# Patient Record
Sex: Female | Born: 1940 | Race: White | Hispanic: No | Smoking: Former smoker
Health system: Southern US, Community
[De-identification: ages and names within clinical notes are randomized; demographics above are authoritative.]

## PROBLEM LIST (undated history)

## (undated) DIAGNOSIS — F32A Depression, unspecified: Secondary | ICD-10-CM

## (undated) DIAGNOSIS — M1990 Unspecified osteoarthritis, unspecified site: Secondary | ICD-10-CM

## (undated) DIAGNOSIS — R29898 Other symptoms and signs involving the musculoskeletal system: Secondary | ICD-10-CM

## (undated) DIAGNOSIS — R112 Nausea with vomiting, unspecified: Secondary | ICD-10-CM

## (undated) DIAGNOSIS — M79604 Pain in right leg: Secondary | ICD-10-CM

## (undated) DIAGNOSIS — Z974 Presence of external hearing-aid: Secondary | ICD-10-CM

## (undated) DIAGNOSIS — D497 Neoplasm of unspecified behavior of endocrine glands and other parts of nervous system: Secondary | ICD-10-CM

## (undated) DIAGNOSIS — M75100 Unspecified rotator cuff tear or rupture of unspecified shoulder, not specified as traumatic: Secondary | ICD-10-CM

## (undated) DIAGNOSIS — M8580 Other specified disorders of bone density and structure, unspecified site: Secondary | ICD-10-CM

## (undated) DIAGNOSIS — Z9889 Other specified postprocedural states: Secondary | ICD-10-CM

## (undated) DIAGNOSIS — G4730 Sleep apnea, unspecified: Secondary | ICD-10-CM

## (undated) DIAGNOSIS — R569 Unspecified convulsions: Secondary | ICD-10-CM

## (undated) DIAGNOSIS — N390 Urinary tract infection, site not specified: Secondary | ICD-10-CM

## (undated) DIAGNOSIS — C801 Malignant (primary) neoplasm, unspecified: Secondary | ICD-10-CM

## (undated) DIAGNOSIS — F329 Major depressive disorder, single episode, unspecified: Secondary | ICD-10-CM

## (undated) DIAGNOSIS — M549 Dorsalgia, unspecified: Secondary | ICD-10-CM

## (undated) DIAGNOSIS — D649 Anemia, unspecified: Secondary | ICD-10-CM

## (undated) DIAGNOSIS — G709 Myoneural disorder, unspecified: Secondary | ICD-10-CM

## (undated) HISTORY — DX: Other symptoms and signs involving the musculoskeletal system: R29.898

## (undated) HISTORY — PX: TONSILLECTOMY: SUR1361

## (undated) HISTORY — PX: COLONOSCOPY: SHX174

## (undated) HISTORY — DX: Other specified disorders of bone density and structure, unspecified site: M85.80

## (undated) HISTORY — DX: Malignant (primary) neoplasm, unspecified: C80.1

## (undated) HISTORY — DX: Unspecified rotator cuff tear or rupture of unspecified shoulder, not specified as traumatic: M75.100

## (undated) HISTORY — DX: Unspecified osteoarthritis, unspecified site: M19.90

## (undated) HISTORY — DX: Urinary tract infection, site not specified: N39.0

## (undated) HISTORY — DX: Presence of external hearing-aid: Z97.4

## (undated) HISTORY — PX: ROTATOR CUFF REPAIR: SHX139

## (undated) HISTORY — DX: Neoplasm of unspecified behavior of endocrine glands and other parts of nervous system: D49.7

## (undated) HISTORY — DX: Dorsalgia, unspecified: M54.9

## (undated) HISTORY — DX: Pain in right leg: M79.604

---
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
## 1971-09-03 HISTORY — PX: APPENDECTOMY: SHX54

## 1971-09-03 HISTORY — PX: ABDOMINAL HYSTERECTOMY: SHX81

## 1985-09-02 HISTORY — PX: OTHER SURGICAL HISTORY: SHX169

## 2000-09-06 ENCOUNTER — Emergency Department (HOSPITAL_COMMUNITY): Admission: EM | Admit: 2000-09-06 | Discharge: 2000-09-06 | Payer: Self-pay | Admitting: Emergency Medicine

## 2000-10-03 ENCOUNTER — Other Ambulatory Visit: Admission: RE | Admit: 2000-10-03 | Discharge: 2000-10-03 | Payer: Self-pay | Admitting: Obstetrics and Gynecology

## 2001-11-20 ENCOUNTER — Other Ambulatory Visit: Admission: RE | Admit: 2001-11-20 | Discharge: 2001-11-20 | Payer: Self-pay | Admitting: Obstetrics and Gynecology

## 2002-05-31 ENCOUNTER — Encounter (INDEPENDENT_AMBULATORY_CARE_PROVIDER_SITE_OTHER): Payer: Self-pay | Admitting: Specialist

## 2002-05-31 ENCOUNTER — Ambulatory Visit (HOSPITAL_COMMUNITY): Admission: RE | Admit: 2002-05-31 | Discharge: 2002-05-31 | Payer: Self-pay | Admitting: Gastroenterology

## 2002-09-02 LAB — HM COLONOSCOPY

## 2004-09-02 HISTORY — PX: OTHER SURGICAL HISTORY: SHX169

## 2005-04-30 ENCOUNTER — Encounter: Admission: RE | Admit: 2005-04-30 | Discharge: 2005-04-30 | Payer: Self-pay | Admitting: Orthopedic Surgery

## 2005-09-02 LAB — HM MAMMOGRAPHY

## 2005-12-03 ENCOUNTER — Other Ambulatory Visit: Admission: RE | Admit: 2005-12-03 | Discharge: 2005-12-03 | Payer: Self-pay | Admitting: Obstetrics and Gynecology

## 2005-12-10 ENCOUNTER — Encounter: Admission: RE | Admit: 2005-12-10 | Discharge: 2005-12-10 | Payer: Self-pay | Admitting: Obstetrics and Gynecology

## 2005-12-23 ENCOUNTER — Encounter: Admission: RE | Admit: 2005-12-23 | Discharge: 2005-12-23 | Payer: Self-pay | Admitting: Obstetrics and Gynecology

## 2007-04-07 ENCOUNTER — Ambulatory Visit: Payer: Self-pay | Admitting: Internal Medicine

## 2007-04-07 DIAGNOSIS — K3189 Other diseases of stomach and duodenum: Secondary | ICD-10-CM | POA: Insufficient documentation

## 2007-04-07 DIAGNOSIS — H5710 Ocular pain, unspecified eye: Secondary | ICD-10-CM | POA: Insufficient documentation

## 2007-04-07 DIAGNOSIS — R1013 Epigastric pain: Secondary | ICD-10-CM

## 2007-04-07 DIAGNOSIS — H538 Other visual disturbances: Secondary | ICD-10-CM | POA: Insufficient documentation

## 2007-04-08 ENCOUNTER — Encounter: Payer: Self-pay | Admitting: Internal Medicine

## 2007-04-09 ENCOUNTER — Ambulatory Visit: Payer: Self-pay | Admitting: Internal Medicine

## 2007-04-09 LAB — CONVERTED CEMR LAB
Bilirubin Urine: NEGATIVE
Glucose, Urine, Semiquant: NEGATIVE
Ketones, urine, test strip: NEGATIVE
Nitrite: NEGATIVE
Protein, U semiquant: NEGATIVE
Specific Gravity, Urine: 1.015
Urobilinogen, UA: 0.2
WBC Urine, dipstick: NEGATIVE
pH: 7

## 2007-04-14 LAB — CONVERTED CEMR LAB
ALT: 14 units/L (ref 0–35)
AST: 21 units/L (ref 0–37)
Albumin: 4.3 g/dL (ref 3.5–5.2)
Alkaline Phosphatase: 57 units/L (ref 39–117)
BUN: 14 mg/dL (ref 6–23)
Basophils Absolute: 0 10*3/uL (ref 0.0–0.1)
Basophils Relative: 0.7 % (ref 0.0–1.0)
Bilirubin, Direct: 0.1 mg/dL (ref 0.0–0.3)
CO2: 28 meq/L (ref 19–32)
Calcium: 9.8 mg/dL (ref 8.4–10.5)
Chloride: 104 meq/L (ref 96–112)
Cholesterol: 207 mg/dL (ref 0–200)
Creatinine, Ser: 0.8 mg/dL (ref 0.4–1.2)
Direct LDL: 99.1 mg/dL
Eosinophils Absolute: 0.1 10*3/uL (ref 0.0–0.6)
Eosinophils Relative: 1.2 % (ref 0.0–5.0)
GFR calc Af Amer: 93 mL/min
GFR calc non Af Amer: 77 mL/min
Glucose, Bld: 90 mg/dL (ref 70–99)
HCT: 34.5 % — ABNORMAL LOW (ref 36.0–46.0)
HDL: 85.2 mg/dL (ref >39.0)
Hemoglobin: 11.9 g/dL — ABNORMAL LOW (ref 12.0–15.0)
LDL Cholesterol: 110 mg/dL — ABNORMAL HIGH (ref 0–99)
Lymphocytes Relative: 38.5 % (ref 12.0–46.0)
MCHC: 34.5 g/dL (ref 30.0–36.0)
MCV: 98.2 fL (ref 78.0–100.0)
Monocytes Absolute: 0.4 10*3/uL (ref 0.2–0.7)
Monocytes Relative: 7.5 % (ref 3.0–11.0)
Neutro Abs: 3.1 10*3/uL (ref 1.4–7.7)
Neutrophils Relative %: 52.1 % (ref 43.0–77.0)
Platelets: 223 10*3/uL (ref 150–400)
Potassium: 5.7 meq/L — ABNORMAL HIGH (ref 3.5–5.1)
RBC: 3.52 M/uL — ABNORMAL LOW (ref 3.87–5.11)
RDW: 12.8 % (ref 11.5–14.6)
Sodium: 141 meq/L (ref 135–145)
TSH: 1.17 microintl units/mL (ref 0.35–5.50)
Total Bilirubin: 1 mg/dL (ref 0.3–1.2)
Total CHOL/HDL Ratio: 2.4
Total Protein: 6.7 g/dL (ref 6.0–8.3)
Triglycerides: 57 mg/dL (ref 0–149)
VLDL: 11 mg/dL (ref 0–40)
WBC: 5.9 10*3/uL (ref 4.5–10.5)

## 2007-04-22 ENCOUNTER — Ambulatory Visit: Payer: Self-pay | Admitting: Internal Medicine

## 2007-04-22 LAB — CONVERTED CEMR LAB
Tissue Transglutaminase Ab, IgA: 0.5 units (ref <7)
Vit D, 1,25-Dihydroxy: 39 (ref 20–57)

## 2007-05-12 ENCOUNTER — Ambulatory Visit: Payer: Self-pay | Admitting: Internal Medicine

## 2007-05-12 DIAGNOSIS — D649 Anemia, unspecified: Secondary | ICD-10-CM | POA: Insufficient documentation

## 2007-05-12 DIAGNOSIS — E875 Hyperkalemia: Secondary | ICD-10-CM | POA: Insufficient documentation

## 2007-06-09 ENCOUNTER — Ambulatory Visit: Payer: Self-pay | Admitting: Internal Medicine

## 2007-06-12 LAB — CONVERTED CEMR LAB
BUN: 17 mg/dL (ref 6–23)
Basophils Absolute: 0 10*3/uL (ref 0.0–0.1)
Basophils Relative: 0.7 % (ref 0.0–1.0)
CO2: 31 meq/L (ref 19–32)
Calcium: 9.9 mg/dL (ref 8.4–10.5)
Chloride: 102 meq/L (ref 96–112)
Creatinine, Ser: 0.8 mg/dL (ref 0.4–1.2)
Eosinophils Absolute: 0.1 10*3/uL (ref 0.0–0.6)
Eosinophils Relative: 1.3 % (ref 0.0–5.0)
Ferritin: 46.8 ng/mL (ref 10.0–291.0)
Folate: >20 ng/mL
GFR calc Af Amer: 93 mL/min
GFR calc non Af Amer: 77 mL/min
Glucose, Bld: 89 mg/dL (ref 70–99)
HCT: 32.8 % — ABNORMAL LOW (ref 36.0–46.0)
Hemoglobin: 11.5 g/dL — ABNORMAL LOW (ref 12.0–15.0)
Iron: 85 ug/dL (ref 42–145)
Lymphocytes Relative: 37.3 % (ref 12.0–46.0)
MCHC: 35 g/dL (ref 30.0–36.0)
MCV: 96.8 fL (ref 78.0–100.0)
Monocytes Absolute: 0.5 10*3/uL (ref 0.2–0.7)
Monocytes Relative: 7.4 % (ref 3.0–11.0)
Neutro Abs: 3.5 10*3/uL (ref 1.4–7.7)
Neutrophils Relative %: 53.3 % (ref 43.0–77.0)
Platelets: 242 10*3/uL (ref 150–400)
Potassium: 5 meq/L (ref 3.5–5.1)
RBC: 3.38 M/uL — ABNORMAL LOW (ref 3.87–5.11)
RDW: 12.7 % (ref 11.5–14.6)
Sodium: 140 meq/L (ref 135–145)
Vitamin B-12: 438 pg/mL (ref 211–911)
WBC: 6.5 10*3/uL (ref 4.5–10.5)

## 2007-06-30 ENCOUNTER — Ambulatory Visit: Payer: Self-pay | Admitting: Internal Medicine

## 2008-01-19 ENCOUNTER — Ambulatory Visit: Payer: Self-pay | Admitting: Internal Medicine

## 2008-01-29 LAB — CONVERTED CEMR LAB
Basophils Absolute: 0 10*3/uL (ref 0.0–0.1)
Basophils Relative: 0.5 % (ref 0.0–1.0)
Eosinophils Absolute: 0.1 10*3/uL (ref 0.0–0.7)
Eosinophils Relative: 1.3 % (ref 0.0–5.0)
HCT: 34.6 % — ABNORMAL LOW (ref 36.0–46.0)
Hemoglobin: 11.5 g/dL — ABNORMAL LOW (ref 12.0–15.0)
Lymphocytes Relative: 42 % (ref 12.0–46.0)
MCHC: 33.3 g/dL (ref 30.0–36.0)
MCV: 99 fL (ref 78.0–100.0)
Monocytes Absolute: 0.3 10*3/uL (ref 0.1–1.0)
Monocytes Relative: 6 % (ref 3.0–12.0)
Neutro Abs: 2.7 10*3/uL (ref 1.4–7.7)
Neutrophils Relative %: 50.2 % (ref 43.0–77.0)
Platelets: 214 10*3/uL (ref 150–400)
RBC: 3.49 M/uL — ABNORMAL LOW (ref 3.87–5.11)
RDW: 12.9 % (ref 11.5–14.6)
WBC: 5.4 10*3/uL (ref 4.5–10.5)

## 2008-05-13 ENCOUNTER — Ambulatory Visit: Payer: Self-pay | Admitting: Internal Medicine

## 2008-05-13 DIAGNOSIS — D485 Neoplasm of uncertain behavior of skin: Secondary | ICD-10-CM | POA: Insufficient documentation

## 2009-07-21 ENCOUNTER — Ambulatory Visit: Payer: Self-pay | Admitting: Internal Medicine

## 2009-07-21 DIAGNOSIS — R0989 Other specified symptoms and signs involving the circulatory and respiratory systems: Secondary | ICD-10-CM | POA: Insufficient documentation

## 2009-08-03 ENCOUNTER — Encounter: Admission: RE | Admit: 2009-08-03 | Discharge: 2009-08-03 | Payer: Self-pay | Admitting: Internal Medicine

## 2009-08-15 ENCOUNTER — Encounter: Payer: Self-pay | Admitting: Internal Medicine

## 2009-08-16 ENCOUNTER — Encounter: Payer: Self-pay | Admitting: Internal Medicine

## 2009-08-16 ENCOUNTER — Ambulatory Visit: Payer: Self-pay

## 2010-10-04 NOTE — Assessment & Plan Note (Signed)
Summary: pain in rib area/njr   Vital Signs:  Patient profile:   70 year old female Menstrual status:  hysterectomy Height:      61.25 inches Weight:      106 pounds BMI:     19.94 O2 Sat:      95 % on Room air Temp:     97.3 degrees F oral Pulse rate:   74 / minute BP sitting:   100 / 70  (left arm) Cuff size:   regular  Vitals Entered By: Romualdo Bolk, CMA (AAMA) (July 21, 2009 1:55 PM)  O2 Flow:  Room air CC: Pain on left side of chest under rib cage. Pt states that it feels like someone is on the inside squeezing and pinching and gets worse during the day. Laying down does calm it down.     Menstrual Status hysterectomy   History of Present Illness: Kaitlyn Flowers comes in today for    sda for above.    Insidious onset of LUQ  LEft lower rib area pain without trauma  or other inciting factor obvious.  Seems to be worse at night and evening  and better in am with rest.  Worse sometimes with a dep breath and no radiation .  Feels like being pinched  really hard and onset 1 week ago.    No injury    never had before . is right handed. Some coughing   nagging like a cold  some chills but no progressive resp .sx  . No NVD .   No rashes .  No uti signs . Generally feels well otherwise.  Preventive Screening-Counseling & Management  Alcohol-Tobacco     Alcohol drinks/day: <1     Alcohol type: wine     Smoking Status: never     Year Quit: tried  Caffeine-Diet-Exercise     Caffeine use/day: 1     Does Patient Exercise: yes     Type of exercise: Walking and Weights     Times/week: 6  Current Medications (verified): 1)  None  Allergies (verified): 1)  ! 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  Half of ovary left.7    Family History: Last updated:  05/13/2008 Family History of Stroke F 1st degree relative   mother in her ease Family History of Arthritis Family History Uterine cancermaternal aunt father and brother are alive and well.    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    Physical Exam  General:  Well-developed,well-nourished,in no acute distress; alert,appropriate and cooperative throughout examination Head:  normocephalic and atraumatic.   Eyes:  vision grossly intact and pupils equal.   Neck:  No deformities, masses, or tenderness noted. Chest Wall:  no deformities and no mass.  no bruising or discrete rib painlaterally Lungs:  Normal respiratory effort, chest expands symmetrically. Lungs are clear  to auscultation, no crackles or wheezes.no dullness.   Heart:  Normal rate and regular rhythm. S1 and S2 normal without gallop, murmur, click, rub or other extra sounds.no lifts.   Abdomen:  soft, non-tender, normal bowel sounds, no distention, no masses, no guarding, no rigidity, no hepatomegaly, and no splenomegaly.  points to area under rib cage  MCL  , left upper quadrant.    mid short systolic bruit heard upper abdomen  no masses . diastole is clear. Pulses:  pulses intact without delay   Extremities:  no clubbing cyanosis or edema  Neurologic:  non focal  Skin:  turgor normal and color  normal.   Cervical Nodes:  No lymphadenopathy noted Inguinal Nodes:  No significant adenopathy Psych:  Oriented X3, good eye contact, not anxious appearing, and not depressed appearing.     Impression & Recommendations:  Problem # 1:  ? of CHEST WALL PAIN, ANTERIOR (ICD-786.52)  vs luq area .  quality of pain seems like cwp but because new and cannot really localize consider further eval if persistent and progressive  .  No other alarm features today.  Orders: Radiology Referral (Radiology)  Problem # 2:  ABDOMINAL BRUIT (ICD-785.9)  scaphoid abdomen  and only systolic  and good pulses but  need to  do [place] abd .   Orders: Doppler Referral (Doppler) Radiology Referral (Radiology)  Patient Instructions: 1)  your pain acts like a chest wall pain  2)  however because of the artery sound on your abdominal exam would rec an ultrasound . Our office will call you about this. 3)  other wise treat sympromatically.   4)  if  persistent and progressive  then follow up . consider c xray.

## 2010-10-04 NOTE — Miscellaneous (Signed)
Summary: Orders Update  Clinical Lists Changes  Orders: Added new Test order of Abdominal Aorta Duplex (Abd Aorta Duplex) - Signed 

## 2010-10-04 NOTE — Assessment & Plan Note (Signed)
Summary: lab roa-smm rsc per pt/njr   Vital Signs:  Patient Profile:   70 Years Old Female Height:     61.25 inches Weight:      110 pounds Pulse rate:   78 / minute BP sitting:   100 / 60  (right arm) Cuff size:   regular  Vitals Entered By: Romualdo Bolk, CMA (June 30, 2007 2:18 PM)                 Chief Complaint:  Follow up on labs.  History of Present Illness: Kaitlyn Flowers comes in today for follow up of lab evaluation.  She has no new symptom . See last ov . She does a gluten free diet because of presumed celiac but no  definitive dx and feels ok.   No hx of current problems.    Current Allergies (reviewed today): ! 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   Skin:     no ecchymoses and no petechiae.   Additional Exam:     see labs    Impression & Recommendations:  Problem # 1:  ANEMIA NOS (ICD-285.9) borderline and neg eval   this may be normal for her.  will follow since she is doing quite well  lot [id number], EXP 30 jun 09, sanofi pasteur left deltoid IM, 0.5 cc. 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 optimize vit d  intake.  Other Orders: Influenza Vaccine MCR (16109)   Patient Instructions: 1)   cbc and diff   in 6 months and as needed   285.9    ]  Influenza Vaccine    Vaccine Type: Fluvax MCR    Given by: Romualdo Bolk, CMA  Flu Vaccine Consent Questions    Do you have a history of severe allergic reactions to this vaccine? no    Any prior history of allergic reactions to egg and/or gelatin? no    Do you have a sensitivity to the preservative Thimersol? no    Do you have a past history of Guillan-Barre Syndrome? no    Do you currently have an acute febrile illness? no    Have you ever had a severe reaction to latex? no    Vaccine information given and explained to patient? yes    Are you currently pregnant? no

## 2010-10-04 NOTE — Assessment & Plan Note (Signed)
Summary: to be est/njr   Vital Signs:  Patient Profile:   70 Years Old Female Height:     61.25 inches Weight:      105 pounds Pulse rate:   78 / minute BP sitting:   120 / 80  (left arm) Cuff size:   regular  Vitals Entered By: Romualdo Bolk, CMA (April 07, 2007 11:18 AM)               Chief Complaint:  New Patient to get establish and eyes are irriatated and hurt started 04/06/07.  History of Present Illness:   This is the initial visit for Kaitlyn Flowers who comes in today to establish.  She has no primary care physician and her GYN is Dr. Pennie Rushing.  She was in her usual state of health until last night and then this morning when after participating in water boats at [employer] she had some eye irritation and improved last night.  This morning however she has had significantly painful eye bilaterally with redness photophobia and blurry vision.  She is unable to read.  She does have glasses and no contacts however this has never happened to her before.  There is no fever or discharge and no history of trauma.  She did try some over-the-counter eyewash but this made it worse.  She prefers to have the light off during the exam.  In regard to her other past medical history she is doing fairly well has not had blood work done in quite a while.  She is up-to-date on her healthcare maintenance see database.  She does have some questions about congested issues.  At one point in time she was told she had gluten sensitivity but then had a blood test and was told that she doesn't have it.  She has had a colonoscopy but I do not think she has had a small bowel biopsy.  There is no family history of celiac disease.  She does have a history of some osteopenia that is apparently stable.  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  Initially thought to be malignant but then reviewed as benign and was treated with radiation.  Her  MRIs have been stable up to 10 years ago and when she was released from care she has no residual symptoms.  Current Allergies (reviewed today): ! PENICILLIN  Past Medical History:    Osteopenia    spinal cord tumor  Past Surgical History:    Spinal Cord [phone]    Shoulder Rotator Cuff2006    Hysterectomyfor a variant tumors.  Half of ovary left.48   Family History:    Family History of Stroke F 1st degree relative   mother in her ease    Family History of Arthritis    Family History Uterine cancermaternal aunt    father and brother are alive and well.  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 who prefers a darkened rim.  She has obvious redness of hers Clara.   Head:     Normocephalic and atraumatic without obvious abnormalities. No apparent alopecia or balding. Eyes:     pupils minimally reactive to light R. regular shape plus two conjunctiva with erythema no obvious lesions or discharge I don't see a ciliary flush. Ears:     External ear exam shows no significant lesions or deformities.  Otoscopic examination reveals clear canals, tympanic membranes are intact bilaterally without bulging, retraction, inflammation or discharge. Hearing is grossly normal bilaterally. Mouth:     Oral mucosa and oropharynx without lesions or  exudates.  Teeth in good repair. Neck:     No deformities, masses, or tenderness noted. Lungs:     Normal respiratory effort, chest expands symmetrically. Lungs are clear to auscultation, no crackles or wheezes. Heart:     Normal rate and regular rhythm. S1 and S2 normal without gallop, murmur, click, rub or other extra sounds. Abdomen:     Bowel sounds positive,abdomen soft and non-tender without masses, organomegaly or hernias noted.there is a systolic bruit over her mid abdomen with no diastolic component and no masses Extremities:     no atrophy Neurologic:     nonfocal Skin:     no unusual rashes Psych:     normally interactive.      Impression & Recommendations:  Problem # 1:  EYE PAIN (ICD-379.91) with photophobia redness and change in vision I recommend she see ophthalmologist today.  She will see Dr. Earlene Plater this afternoon.  I am concerned about arthritis or some chemical irritant on her eyes bilaterally.  Problem # 2:  OSTEOPENIA (ICD-733.90) we will schedule a vitamin D level as well as CPX lab.  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  We can repeat a celiac panel and review any records that we are able to obtain in the future.  I will discuss more at follow-up.  Problem # 4:  Preventive Health Care (ICD-V70.0) patient appears to be up-to-date and preventive measures.  We are unsure of her last tetanus shot but will address that at her follow-up.   Patient Instructions: 1)  she will see ophthalmologist today. 2)   Will plan fasting lab tests and rov 3)  BMP,LFT,CBC diff,Lipid,TSH, U/A.  V70.0  4)  vitamin d 733.9 5)  celiac panel 773.9           Appended Document: to be est/njr LMTOCB  Appended Document: to be est/njr Pt aware and lab appointment made for 04/09/07

## 2010-11-23 ENCOUNTER — Ambulatory Visit (INDEPENDENT_AMBULATORY_CARE_PROVIDER_SITE_OTHER): Payer: Medicare Other | Admitting: Internal Medicine

## 2010-11-23 ENCOUNTER — Encounter: Payer: Self-pay | Admitting: Internal Medicine

## 2010-11-23 VITALS — BP 100/60 | HR 72 | Temp 98.4°F | Wt 105.0 lb

## 2010-11-23 DIAGNOSIS — D497 Neoplasm of unspecified behavior of endocrine glands and other parts of nervous system: Secondary | ICD-10-CM

## 2010-11-23 DIAGNOSIS — M549 Dorsalgia, unspecified: Secondary | ICD-10-CM

## 2010-11-23 DIAGNOSIS — M545 Low back pain, unspecified: Secondary | ICD-10-CM

## 2010-11-23 NOTE — Patient Instructions (Signed)
Someone will contact you  About getting the mri.   FU as appropriate .

## 2010-11-23 NOTE — Progress Notes (Signed)
  Subjective:    Patient ID: Kaitlyn Flowers, female    DOB: May 05, 1941, 70 y.o.   MRN: [id number]  HPI  patient comes in today for a visit to discuss the problem with her lower back. She states that she has had a mild problem off and on for a while and sees the chiropractor Dr. Melvyn Neth as needed. However her back problem has been worse over the last month or so and she is worried it is something more important. Low back   Is Achy  Tingling in  Legs off an on.   No nocturnal worse after sitting  bending.  ? If legs felt weaker than usual no falling.  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 Initially felt to be partly malignant but on second opinion benign.   Tumor follow up mri for 4 years  Showed no change so she has had no followup   imaging since then   Past Medical History  Diagnosis Date  . Osteopenia   . 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   Past Surgical History  Procedure Date  . Abdominal hysterectomy 1973    1/2 of ovary left  . Shoulder rotator culff  2006  . Spinal cord tumor 1987    reports that she has never smoked. She does not have any smokeless tobacco history on file. She reports that she drinks alcohol. She reports that she does not use illicit drugs. family history includes Stroke in her mother. Allergies  Allergen Reactions  . Penicillins     .Review of Systems No fever     Bleeding   Chest pain shortness of breath inability to exercise bowel or bladder dysfunctional changes. Rest of ROS noncontributory    Objective:   Physical Exam  well-developed well-nourished in no acute distress gait within normal limits. Neck supple without masses or bruits  Chest:  Clear to A&P without wheezes rales or rhonchi CV:  S1-S2 no gallops or murmurs peripheral perfusion is normal MS well healed scare low thoracic area  Good rom toe  heel walk is good   ROM back is nl.  Neuro hard to elicit dtrs le no clonus noted      Assessment & Plan:   lumbar back pain:  change in baseline this past history of thoracic spinal tumor.  She has some very mild leg symptoms associated with this that come and go. Otherwise no obvious associated symptoms of concern.   I advised MRI to look at this area although this may end up just being degenerative disease.  we'll send order for this call in the meantime and will followup and checkup in about a month.

## 2010-11-26 ENCOUNTER — Telehealth: Payer: Self-pay | Admitting: *Deleted

## 2010-11-26 DIAGNOSIS — M549 Dorsalgia, unspecified: Secondary | ICD-10-CM

## 2010-11-26 DIAGNOSIS — M899 Disorder of bone, unspecified: Secondary | ICD-10-CM

## 2010-11-26 DIAGNOSIS — E785 Hyperlipidemia, unspecified: Secondary | ICD-10-CM

## 2010-11-26 DIAGNOSIS — D649 Anemia, unspecified: Secondary | ICD-10-CM

## 2010-11-27 ENCOUNTER — Other Ambulatory Visit (INDEPENDENT_AMBULATORY_CARE_PROVIDER_SITE_OTHER): Payer: Medicare Other | Admitting: Internal Medicine

## 2010-11-27 DIAGNOSIS — E785 Hyperlipidemia, unspecified: Secondary | ICD-10-CM

## 2010-11-27 DIAGNOSIS — M549 Dorsalgia, unspecified: Secondary | ICD-10-CM

## 2010-11-27 DIAGNOSIS — M8580 Other specified disorders of bone density and structure, unspecified site: Secondary | ICD-10-CM

## 2010-11-27 DIAGNOSIS — M899 Disorder of bone, unspecified: Secondary | ICD-10-CM

## 2010-11-27 DIAGNOSIS — D649 Anemia, unspecified: Secondary | ICD-10-CM

## 2010-11-27 DIAGNOSIS — M949 Disorder of cartilage, unspecified: Secondary | ICD-10-CM

## 2010-11-27 LAB — LIPID PANEL
Cholesterol: 231 mg/dL — ABNORMAL HIGH (ref 0–200)
HDL: 101.8 mg/dL (ref >39.00)
Total CHOL/HDL Ratio: 2
Triglycerides: 41 mg/dL (ref 0.0–149.0)
VLDL: 8.2 mg/dL (ref 0.0–40.0)

## 2010-11-27 LAB — CBC WITH DIFFERENTIAL/PLATELET
Basophils Absolute: 0 10*3/uL (ref 0.0–0.1)
Basophils Relative: 0.5 % (ref 0.0–3.0)
Eosinophils Absolute: 0.1 10*3/uL (ref 0.0–0.7)
Eosinophils Relative: 2 % (ref 0.0–5.0)
HCT: 33.3 % — ABNORMAL LOW (ref 36.0–46.0)
Hemoglobin: 11.5 g/dL — ABNORMAL LOW (ref 12.0–15.0)
Lymphocytes Relative: 39.2 % (ref 12.0–46.0)
Lymphs Abs: 1.8 10*3/uL (ref 0.7–4.0)
MCHC: 34.6 g/dL (ref 30.0–36.0)
MCV: 98.3 fl (ref 78.0–100.0)
Monocytes Absolute: 0.3 10*3/uL (ref 0.1–1.0)
Monocytes Relative: 7.1 % (ref 3.0–12.0)
Neutro Abs: 2.4 10*3/uL (ref 1.4–7.7)
Neutrophils Relative %: 51.2 % (ref 43.0–77.0)
Platelets: 217 10*3/uL (ref 150.0–400.0)
RBC: 3.38 Mil/uL — ABNORMAL LOW (ref 3.87–5.11)
RDW: 14.2 % (ref 11.5–14.6)
WBC: 4.7 10*3/uL (ref 4.5–10.5)

## 2010-11-27 LAB — BASIC METABOLIC PANEL
BUN: 15 mg/dL (ref 6–23)
CO2: 28 mEq/L (ref 19–32)
Calcium: 9.6 mg/dL (ref 8.4–10.5)
Chloride: 104 mEq/L (ref 96–112)
Creatinine, Ser: 0.7 mg/dL (ref 0.4–1.2)
GFR: 89.76 mL/min (ref >60.00)
Glucose, Bld: 77 mg/dL (ref 70–99)
Potassium: 4.4 mEq/L (ref 3.5–5.1)
Sodium: 139 mEq/L (ref 135–145)

## 2010-11-27 LAB — LDL CHOLESTEROL, DIRECT: Direct LDL: 110.1 mg/dL

## 2010-11-27 LAB — TSH: TSH: 1.12 u[IU]/mL (ref 0.35–5.50)

## 2010-11-27 NOTE — Progress Notes (Signed)
Can't close the encounter without a note.

## 2010-11-28 LAB — C-REACTIVE PROTEIN: CRP: 0 mg/dL (ref <0.6)

## 2010-12-03 ENCOUNTER — Telehealth: Payer: Self-pay | Admitting: *Deleted

## 2010-12-03 ENCOUNTER — Ambulatory Visit
Admission: RE | Admit: 2010-12-03 | Discharge: 2010-12-03 | Disposition: A | Discharge status: Home / Self Care | Payer: BC Managed Care – PPO | Source: Ambulatory Visit | Attending: Internal Medicine | Admitting: Internal Medicine

## 2010-12-03 DIAGNOSIS — M549 Dorsalgia, unspecified: Secondary | ICD-10-CM

## 2010-12-03 DIAGNOSIS — D497 Neoplasm of unspecified behavior of endocrine glands and other parts of nervous system: Secondary | ICD-10-CM

## 2010-12-03 MED ORDER — GADOBENATE DIMEGLUMINE 529 MG/ML IV SOLN
10.0000 mL | Freq: Once | INTRAVENOUS | Status: AC | PRN
  Administered 2010-12-03: 10 mL via INTRAVENOUS

## 2010-12-03 NOTE — Telephone Encounter (Signed)
Message copied by Tor Netters on Mon Dec 03, 2010  4:51 PM ------      Message from: [facility], [place] K      Created: Mon Dec 03, 2010  3:33 PM       Informed patient of the MRI results. There are postsurgical changes and small asked that they think might be a meningioma or swan, benign tumor which is probably at the area of her previous surgery. She also had a slightly bulging disc and some encroachment on her nerves in the lumbar area which could be giving her leg symptoms.            I suggest we get a neurosurgery consult to look at her and her MRI for best advice.      Dr Danielle Dess  Et al.

## 2010-12-03 NOTE — Telephone Encounter (Signed)
Left message for pt call back:  

## 2010-12-04 NOTE — Telephone Encounter (Signed)
Pt aware of results and wants to go ahead with neurosurgeon referral. Order sent to [facility].

## 2010-12-18 ENCOUNTER — Ambulatory Visit (INDEPENDENT_AMBULATORY_CARE_PROVIDER_SITE_OTHER): Payer: Medicare Other | Admitting: Internal Medicine

## 2010-12-18 ENCOUNTER — Encounter: Payer: Self-pay | Admitting: Internal Medicine

## 2010-12-18 VITALS — BP 100/60 | HR 60 | Ht 61.0 in | Wt 105.0 lb

## 2010-12-18 DIAGNOSIS — Z974 Presence of external hearing-aid: Secondary | ICD-10-CM

## 2010-12-18 DIAGNOSIS — D497 Neoplasm of unspecified behavior of endocrine glands and other parts of nervous system: Secondary | ICD-10-CM

## 2010-12-18 DIAGNOSIS — Z0000 Encounter for general adult medical examination without abnormal findings: Secondary | ICD-10-CM | POA: Insufficient documentation

## 2010-12-18 DIAGNOSIS — Z23 Encounter for immunization: Secondary | ICD-10-CM

## 2010-12-18 DIAGNOSIS — M899 Disorder of bone, unspecified: Secondary | ICD-10-CM

## 2010-12-18 DIAGNOSIS — D649 Anemia, unspecified: Secondary | ICD-10-CM

## 2010-12-18 NOTE — Patient Instructions (Signed)
Schedule bone density You can check on the zoster that shingles vaccine cost and schedule appointment I will review your old record about the anemia. We will probably get a gastroenterology referral in regards to colon cancer screening and the anemia. We may need to do more blood work to decide on further workup to include things such as B12 levels and iron levels. We will then plan followup.

## 2010-12-18 NOTE — Progress Notes (Signed)
Subjective:    Kaitlyn Flowers is a 70 y.o. female who presents for a Medicare  Wellness exam.  And other problem issues follow up. Since her last visit she has seenThe neurosurgeon and had an MRI of her spine.   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 She negotiated with Dr. Danielle Dess to wait a bit since her symptoms are so minimal to none.  Otherwise she's doing well Cardiac risk factors: advanced age (older than 61 for men, 13 for women).  Activities of Daily Living  In your present state of health, do you have any difficulty performing the following activities?:  Preparing food and eating?: No Bathing yourself: No Getting dressed: No Using the toilet:No Moving around from place to place: No In the past year have you fallen or had a near fall?:No  Current exercise habits: Home exercise routine includes walking 4 hrs per week and yoga. Gym/ health club routine includes cardio.   Dietary issues discussed:  Healthy diet in general  Depression Screen (Note: if answer to either of the following is "Yes", then a more complete depression screening is indicated)  Q1: Over the past two weeks, have you felt down, depressed or hopeless?no Q2: Over the past two weeks, have you felt little interest or pleasure in doing things? no   Hearing:  Hearing aids   Vision:   Good  Has checks   Safety: No falls .  Has smoke detector and wears seat belts.  No firearms. No excess sun exposure. Sees dentist regularly   Advance directive :  Reviewed  Memory:  Good and no concerns.  The following portions of the patient's history were reviewed and updated as appropriate: allergies, current medications, past family history, past medical history, past social history, past surgical history and problem list. Review of Systems A comprehensive review of systems was negative.   Except as per HPI in regard to her back. She has no G.I.  Disturbance but a history of anemia on review of her old charts. She has not had a colonoscopy for quite a while. Does not remember who did it. There's no bruising or bleeding chest pain shortness of breath lumps on her body she does wear hearing aids and have helped good bet no major changes in her vision. Their new skin changes   Objective:     Vision by Snellen chart: right eye: Last eye done on 02/01/08 normal done by [facility] Blood pressure 100/60, pulse 60, height 5\' 1"  (1.549 m), weight 105 lb (47.628 kg). Body mass index is 19.84 kg/(m^2).   Physical Exam: Vital signs reviewed WUJ:WJXB is a well-developed well-nourished alert cooperative  white female who appears her stated age in no acute distress.  HEENT: normocephalic  traumatic , Eyes: PERRL EOM's full, conjunctiva clear, Nares: paten,t no deformity discharge or tenderness., Ears: no deformity EAC's clear TMs with normal landmarks.   Hearing aids  Mouth: clear OP, no lesions, edema.  Moist mucous membranes. Dentition in adequate repair. NECK: supple without masses, thyromegaly or bruits. CHEST/PULM:  Clear to auscultation and percussion breath sounds equal no wheeze , rales or rhonchi. No chest wall deformities or tenderness. Breast: normal by inspection . No dimpling, discharge, masses, tenderness or discharge . LN: no cervical axillary inguinal adenopathy GYNE hysterectomy and oopherectomy  CV: PMI is nondisplaced, S1 S2 no gallops, murmurs, rubs. Peripheral pulses are full without delay.No JVD .  ABDOMEN: Bowel sounds  normal nontender  No guard or rebound, no hepato splenomegal no CVA tenderness.  No hernia. Extremtities:  No clubbing cyanosis or edema, no acute joint swelling or redness no focal atrophy NEURO:  Oriented x3, cranial nerves 3-12 appear to be intact, no obvious focal weakness,gait within normal limits no abnormal reflexes or asymmetrical SKIN: No acute rashes normal turgor, color, no bruising or  petechiae. Oriented x 3 and no noted deficits in memory, attention, and speech. Oriented x 3 and no noted deficits in memory, attention, and speech.  PSYCH: Oriented, good eye contact, no obvious depression anxiety, cognition and judgment appear normal. Labs reviewed   EKG :  NSR  Normal (  Automated reading says a flutter and  Old ant infarct)   No prev to compare.  Assessment:  Preventive Health Care Wellness Counseled about healthy diet ,nutrition, exercise, sun protection, calcium vitamin D and injury prevention.   Anemia   Mild and prob old      Plan checking more recent iron studies etc.   Cancer screening may be due anyway. No sigs or sx.  Osteopenia    To get dexa Meningioma spine thoracic   Mild back pain  May get second opinion but  Appears that surgery is the appropriate  Course.before it becomes  Problematic. Hearing aids   Doing well with these.       Plan:     During the course of the visit the patient was educated and counseled about appropriate screening and preventive services including:   Pneumococcal vaccine   Td vaccine  Screening electrocardiogram  Check into zostavax  Patient Instructions (the written plan) was given to the patient.     See above plan and instructions    Record reviewed.   Hx of  Low grade amenia in past and nl ferritin.  REC   CBCdiff , ibc panel, b12 level, IFE.   And then Gi referral .  Plan follow up depending on labs and evaluation.

## 2010-12-20 ENCOUNTER — Encounter: Payer: Self-pay | Admitting: Internal Medicine

## 2010-12-20 NOTE — Assessment & Plan Note (Addendum)
Considering second opinion  Currently prob mild sx from this .   Call if needs  Referral but can talk with Dr Danielle Dess about this

## 2010-12-20 NOTE — Assessment & Plan Note (Signed)
Get bone density

## 2010-12-21 ENCOUNTER — Telehealth: Payer: Self-pay | Admitting: *Deleted

## 2010-12-21 DIAGNOSIS — D649 Anemia, unspecified: Secondary | ICD-10-CM

## 2010-12-21 NOTE — Telephone Encounter (Signed)
Pt aware and lab appt made. Order sent to [facility].

## 2010-12-21 NOTE — Telephone Encounter (Signed)
Per Dr. Fabian Sharp- REC CBCdiff ; ibc pane;, b12 level; IFE; Celiac 10 panel. Dx anemia And then Gi referral .  Plan follow up depending on labs and evaluation.  Please contact patient and arrange this please  Left message to call back.

## 2010-12-25 ENCOUNTER — Ambulatory Visit (INDEPENDENT_AMBULATORY_CARE_PROVIDER_SITE_OTHER)
Admission: RE | Admit: 2010-12-25 | Discharge: 2010-12-25 | Disposition: A | Discharge status: Home / Self Care | Payer: Medicare Other | Source: Ambulatory Visit

## 2010-12-25 ENCOUNTER — Other Ambulatory Visit (INDEPENDENT_AMBULATORY_CARE_PROVIDER_SITE_OTHER): Payer: Medicare Other | Admitting: Internal Medicine

## 2010-12-25 DIAGNOSIS — D649 Anemia, unspecified: Secondary | ICD-10-CM

## 2010-12-25 DIAGNOSIS — M899 Disorder of bone, unspecified: Secondary | ICD-10-CM

## 2010-12-25 LAB — CBC WITH DIFFERENTIAL/PLATELET
Basophils Absolute: 0 10*3/uL (ref 0.0–0.1)
Basophils Relative: 0.7 % (ref 0.0–3.0)
Eosinophils Absolute: 0.1 10*3/uL (ref 0.0–0.7)
Eosinophils Relative: 1.3 % (ref 0.0–5.0)
HCT: 32.5 % — ABNORMAL LOW (ref 36.0–46.0)
Hemoglobin: 11.4 g/dL — ABNORMAL LOW (ref 12.0–15.0)
Lymphocytes Relative: 35.3 % (ref 12.0–46.0)
Lymphs Abs: 1.9 10*3/uL (ref 0.7–4.0)
MCHC: 35.1 g/dL (ref 30.0–36.0)
MCV: 99.1 fl (ref 78.0–100.0)
Monocytes Absolute: 0.4 10*3/uL (ref 0.1–1.0)
Monocytes Relative: 7.3 % (ref 3.0–12.0)
Neutro Abs: 3 10*3/uL (ref 1.4–7.7)
Neutrophils Relative %: 55.4 % (ref 43.0–77.0)
Platelets: 231 10*3/uL (ref 150.0–400.0)
RBC: 3.28 Mil/uL — ABNORMAL LOW (ref 3.87–5.11)
RDW: 13.7 % (ref 11.5–14.6)
WBC: 5.5 10*3/uL (ref 4.5–10.5)

## 2010-12-25 LAB — IRON: Iron: 92 ug/dL (ref 42–145)

## 2010-12-25 LAB — IBC PANEL
Iron: 92 ug/dL (ref 42–145)
Saturation Ratios: 24.8 % (ref 20.0–50.0)
Transferrin: 264.6 mg/dL (ref 212.0–360.0)

## 2010-12-25 LAB — VITAMIN B12: Vitamin B-12: 409 pg/mL (ref 211–911)

## 2010-12-26 LAB — CELIAC PANEL 10
Endomysial Screen: NEGATIVE
Gliadin IgA: 11 U/mL (ref <20)
Gliadin IgG: 12.1 U/mL (ref <20)
IgA: 325 mg/dL (ref 68–378)
Tissue Transglut Ab: 15.6 U/mL (ref <20)
Tissue Transglutaminase Ab, IgA: 7.9 U/mL (ref <20)

## 2011-01-01 ENCOUNTER — Telehealth: Payer: Self-pay | Admitting: *Deleted

## 2011-01-01 DIAGNOSIS — D649 Anemia, unspecified: Secondary | ICD-10-CM

## 2011-01-01 NOTE — Telephone Encounter (Signed)
Pt aware of results and order placed in epic for future labs

## 2011-01-01 NOTE — Telephone Encounter (Signed)
Message copied by Tor Netters on Tue Jan 01, 2011  2:48 PM ------      Message from: [facility], [place] K      Created: Mon Dec 31, 2010  6:04 PM       Tell patient that her anemia is about the same her iron is low normalB12 is adequate celiac screen is negative.            Carollee Herter I don't see IFE. result or order I suppose this wasn't done please check on this)        Make sure she has a GI referral.   We should recheck her CBC diff  ,   IFE if not done .    In 2-3 months .

## 2011-01-01 NOTE — Telephone Encounter (Signed)
Pt aware of results 

## 2011-01-01 NOTE — Telephone Encounter (Signed)
Message copied by Tor Netters on Tue Jan 01, 2011  2:49 PM ------      Message from: [facility], [place]      Created: Mon Dec 31, 2010  6:04 PM       Tell patient that her anemia is about the same her iron is low normalB12 is adequate celiac screen is negative.            Carollee Herter I don't see IFE. result or order I suppose this wasn't done please check on this)        Make sure she has a GI referral.   We should recheck her CBC diff  ,   IFE if not done .    In 2-3 months .

## 2011-01-18 NOTE — Op Note (Signed)
   NAMEVON, INSCOE                       ACCOUNT NO.:  [id number]   MEDICAL RECORD NO.:  [id number]                   PATIENT TYPE:  AMB   LOCATION:  ENDO                                 FACILITY:  [facility]   PHYSICIAN:  Florencia Reasons, M.D.             DATE OF BIRTH:  10/28/1940   DATE OF PROCEDURE:  05/31/2002  DATE OF DISCHARGE:                                 OPERATIVE REPORT   PROCEDURE:  Colonoscopy with biopsies.   INDICATIONS:  A 70 year old female with tendency toward diarrhea (getting  better on enzyme supplementation) and need for colon cancer screening.   FINDINGS:  Normal exam to the terminal ileum.  Random biopsies obtained.   DESCRIPTION OF PROCEDURE:  The nature, purpose, and risks of the procedure  had been discussed with the patient, who provided written consent.  Sedation  was fentanyl 70 mcg and Versed 7 mg IV without arrhythmias or desaturation.  The Olympus adjustable-tension pediatric video colonoscope was advanced with  some difficulty due to redundancy of the colon and quite a bit of looping.  Ultimately, I reached the terminal ileum with the patient in the supine  position and some external abdominal compression being applied.   Pullback was then performed.  The quality of the prep was excellent, and it  is felt that all areas were well-seen.   This was a normal examination.  No polyps, cancer, colitis, diverticular  disease, vascular malformations, or other abnormalities were seen, and in  particular there was no source of diarrhea endoscopically evident.  Multiple  biopsies were obtained both from the terminal ileum and randomly around the  colon during withdrawal of the scope.   Retroflexion in the rectum and reinspection of the rectum and distal sigmoid  was unremarkable.  The patient tolerated the procedure well, and there were  no apparent complications.   IMPRESSION:  Normal exam.   PLAN:  Await pathology.                         Florencia Reasons, M.D.    RVB/MEDQ  D:  05/31/2002  T:  06/01/2002  Job:  [id number]   cc:   Debroah Loop, M.D.

## 2011-02-07 ENCOUNTER — Other Ambulatory Visit: Payer: Self-pay | Admitting: Gastroenterology

## 2011-02-21 ENCOUNTER — Telehealth: Payer: Self-pay | Admitting: Internal Medicine

## 2011-02-21 NOTE — Telephone Encounter (Signed)
Spoke to pt- Dr. Danielle Dess referred to PT, tumor on hold with it. The PT is helping with the leg pain but not helping with the back pain. Pt wants to try some acupuncture for the back pain. Pt hasn't talked to Dr. Danielle Dess about the acupuncture because she didn't know if she needed to call us or go thru him. She wants this to get paid via her Ins.

## 2011-02-21 NOTE — Telephone Encounter (Signed)
Pt called re: back issues. Pt has already been eval by Dr Fabian Sharp re: this matter, but pt would like to know about alternative therapy options.

## 2011-02-23 NOTE — Telephone Encounter (Signed)
I would suggest going to Dr. Verlee Rossetti office because he also deals with back pain.

## 2011-02-25 NOTE — Telephone Encounter (Signed)
Left message on machine about this. 

## 2011-02-26 ENCOUNTER — Encounter: Payer: Self-pay | Admitting: Internal Medicine

## 2011-02-27 NOTE — Progress Notes (Signed)
Left message on machine about results. 

## 2011-02-28 ENCOUNTER — Encounter: Payer: Self-pay | Admitting: Internal Medicine

## 2011-03-21 NOTE — Telephone Encounter (Signed)
Taken care of

## 2011-05-09 ENCOUNTER — Other Ambulatory Visit (HOSPITAL_COMMUNITY): Payer: Self-pay | Admitting: Neurological Surgery

## 2011-05-09 DIAGNOSIS — M545 Low back pain, unspecified: Secondary | ICD-10-CM

## 2011-06-03 HISTORY — PX: OTHER SURGICAL HISTORY: SHX169

## 2011-06-04 ENCOUNTER — Other Ambulatory Visit (HOSPITAL_COMMUNITY): Payer: Self-pay | Admitting: Neurological Surgery

## 2011-06-04 ENCOUNTER — Other Ambulatory Visit (HOSPITAL_COMMUNITY): Payer: Medicare Other

## 2011-06-04 ENCOUNTER — Ambulatory Visit (HOSPITAL_COMMUNITY)
Admission: RE | Admit: 2011-06-04 | Discharge: 2011-06-04 | Disposition: A | Discharge status: Home / Self Care | Payer: Medicare Other | Source: Ambulatory Visit | Attending: Neurological Surgery | Admitting: Neurological Surgery

## 2011-06-04 ENCOUNTER — Encounter (HOSPITAL_COMMUNITY)
Admission: RE | Admit: 2011-06-04 | Discharge: 2011-06-04 | Disposition: A | Discharge status: Home / Self Care | Payer: Medicare Other | Source: Ambulatory Visit | Attending: Neurological Surgery | Admitting: Neurological Surgery

## 2011-06-04 DIAGNOSIS — M545 Low back pain, unspecified: Secondary | ICD-10-CM

## 2011-06-04 DIAGNOSIS — M5137 Other intervertebral disc degeneration, lumbosacral region: Secondary | ICD-10-CM | POA: Insufficient documentation

## 2011-06-04 DIAGNOSIS — M51379 Other intervertebral disc degeneration, lumbosacral region without mention of lumbar back pain or lower extremity pain: Secondary | ICD-10-CM | POA: Insufficient documentation

## 2011-06-04 DIAGNOSIS — D321 Benign neoplasm of spinal meninges: Secondary | ICD-10-CM | POA: Insufficient documentation

## 2011-06-04 LAB — SURGICAL PCR SCREEN
MRSA, PCR: NEGATIVE
Staphylococcus aureus: NEGATIVE

## 2011-06-04 LAB — CBC
HCT: 32.5 % — ABNORMAL LOW (ref 36.0–46.0)
Hemoglobin: 11.4 g/dL — ABNORMAL LOW (ref 12.0–15.0)
MCH: 33.8 pg (ref 26.0–34.0)
MCHC: 35.1 g/dL (ref 30.0–36.0)
MCV: 96.4 fL (ref 78.0–100.0)
Platelets: 204 10*3/uL (ref 150–400)
RBC: 3.37 MIL/uL — ABNORMAL LOW (ref 3.87–5.11)
RDW: 13.5 % (ref 11.5–15.5)
WBC: 6.1 10*3/uL (ref 4.0–10.5)

## 2011-06-04 LAB — TYPE AND SCREEN
ABO/RH(D): O POS
Antibody Screen: NEGATIVE

## 2011-06-04 LAB — ABO/RH: ABO/RH(D): O POS

## 2011-06-04 MED ORDER — IOHEXOL 300 MG/ML  SOLN
10.0000 mL | Freq: Once | INTRAMUSCULAR | Status: AC | PRN
  Administered 2011-06-04: 10 mL via INTRATHECAL

## 2011-06-11 ENCOUNTER — Other Ambulatory Visit: Payer: Self-pay | Admitting: Neurological Surgery

## 2011-06-11 ENCOUNTER — Inpatient Hospital Stay (HOSPITAL_COMMUNITY)
Admission: RE | Admit: 2011-06-11 | Discharge: 2011-06-13 | DRG: 030 | Disposition: A | Discharge status: Home / Self Care | Payer: Medicare Other | Source: Ambulatory Visit | Attending: Neurological Surgery | Admitting: Neurological Surgery

## 2011-06-11 DIAGNOSIS — M949 Disorder of cartilage, unspecified: Secondary | ICD-10-CM | POA: Diagnosis present

## 2011-06-11 DIAGNOSIS — M899 Disorder of bone, unspecified: Secondary | ICD-10-CM | POA: Diagnosis present

## 2011-06-11 DIAGNOSIS — D321 Benign neoplasm of spinal meninges: Principal | ICD-10-CM | POA: Diagnosis present

## 2011-06-11 DIAGNOSIS — M549 Dorsalgia, unspecified: Secondary | ICD-10-CM | POA: Diagnosis present

## 2011-07-03 NOTE — Op Note (Signed)
Kaitlyn Flowers, Kaitlyn Flowers NO.:  [id number]  MEDICAL RECORD NO.:  [id number]  LOCATION:  3113                         FACILITY:  [facility]  PHYSICIAN:  [name], M.D.  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  PROCEDURE:  Repeat laminectomy at T12-L1 and subtotal resection of recurrent meningioma at T12-L1 with operating microscope by microdissection technique and use of CUSA ultrasonic aspirator.  SURGEON:  [name], M.D.  FIRST ASSISTANT:  Clydene Fake, M.D.  ANESTHESIA:  General endotracheal.  INDICATIONS:  Kaitlyn Flowers is a 70 year old individual who had significant dysesthesias in the lower extremities with some modest weakness.  She was found to have an enlarging mass in the thoracic spinal canal and she tells me that 20 years ago, she had resection of a meningioma.  They told her at that time, it was a subtotal resection and she had undergone some early chemotherapy and had undergone radiation treatments.  She was evaluated with myelography and postmyelogram CAT scan in addition to an MRI, which demonstrated recurrent lesion up to the right side of the spinal cord pushing the spinal cord over to the left side.  She is taken to the operating room at this time to undergo surgical re-resection procedure.  PROCEDURE IN DETAIL:  The patient was brought to the operating room in supine on the stretcher.  After smooth induction of general endotracheal anesthesia, she was turned prone and had a Foley catheter placed prior to this in addition to monitoring of somatosensory and motor evoked potentials in the upper and lower extremities.  The initial readings showed normal function of motor evoked potentials  and somatosensory evoked potentials.  We then prepped the back with alcohol and DuraPrep and draped in a sterile fashion.  Elliptical incision was created around the previous scar and this tissue was excised.  The dissection was then carried down to the thoracodorsal fascia and the bony edges of the previous laminectomy were identified.  These were then dissected and resected and widened to allow better visualization of the dorsal dura. The tissue was noted to be very sclerotic as this was consistent with her previously radiated tissue.  The laminectomy had been growing in on itself and a high-speed drill and a 2.5 mm dissecting tool was used to widen out the laminectomy particularly on the right side to expose better the edges of the dura.  Once this had been completed, hemostasis in the bony edges was obtained with some Gelfoam soaked thrombin pad and pledgets.  The dura was prepared for opening.  Midline opening was created and when the mass was identified in the superior aspect, there was noted to be some venous bleeding from the dissection and opening was then created in the inferior aspect of the dura and this was dissected cephalad at the junction with the tumor.  There was noted to be a portion of tumor that had become adherent to the dura itself.  By rolling the  dura over towards the right side. we then carefully performed dissection releasing some tethering of the tumor to the dura itself.  The dissection was then continued and we identified the inferior and superior aspects of the tumor.  There was noted be a number of nerve roots growing the lateral to the tumor and these were carefully dissected and protected.  There was in fact one major vascular structure which was believed to be a vein and this was carefully dissected from the shell of the tumor and preserved.  The dissection then further attempted to release tumor from the cord, but it was noted to be densely adherent and  appeared to be growing with into the substance of the cord. During this time, we recheck the somatosensory evoked potentials and no changes were noted.  It was decided to use a CUSA ultrasonic aspirator to help debulk the tumor and remove a good portion of tumor.  With this, we proceeded to dissect the tumor until the posterior edge was identified.  The shell could then be rolled and was dissected away from the nerve roots.  Ultimately, a small remnant of tumor that was attached to the cord on the dorsolateral surface of the cord was all that remained, this was carefully dissected with a CUSA ultrasonic dissected; however, further manipulation of this area was deforming the spinal cord to some significant degree that is not comfortable.  At this point, we decided to ceased the dissection as there was substantial bleeding from this attachment point.  This was tamponaded with some pledgets of Gelfoam soaked thrombin and these were later irrigated away.  Once the dissection was completed and hemostasis was established carefully, then the area was inspected and remnants of blood clots that were present were irrigated away.  A series of quarter-by cottonoids were used to prevent blood from entering into the spinal canal, cephalad and inferiorly, and these were individually removed.  Dr. Phoebe Perch helped with the entire microscopic portion of the procedures, especially dissection, retraction and manipulation of the cord while under the microscope and also using the CUSA ultrasonic dissector.  The tumor was being grossly removed and hemostasis being achieved in the edge of the cord.  The dura was then reapproximated with 6-0 Prolene suture in a running fashion.  A watertight closure was obtained and this was noted to be watertight to Valsalva 40 cm of water.  Then the thoracodorsal fascia was closed with #1 Vicryl initially and 2-0 Vicryl stick to complete the closure.  2-0 Vicryl was used in the  subcutaneous tissues and 3-0 Vicryl subcuticularly.  Dermabond was used on the skin. The patient tolerated procedure well.  Blood loss was estimated at less than 100 mL.  She was returned to recovery room in stable condition.     [name], M.D.     Merla Riches  D:  06/11/2011  T:  06/11/2011  Job:  [id number]  Electronically Signed by Barnett Abu M.D. on 07/03/2011 07:08:19 AM

## 2011-07-22 ENCOUNTER — Emergency Department (HOSPITAL_COMMUNITY)
Admission: EM | Admit: 2011-07-22 | Discharge: 2011-07-22 | Disposition: A | Discharge status: Home / Self Care | Payer: Medicare Other | Attending: Emergency Medicine | Admitting: Emergency Medicine

## 2011-07-22 ENCOUNTER — Encounter (HOSPITAL_COMMUNITY): Payer: Self-pay | Admitting: *Deleted

## 2011-07-22 DIAGNOSIS — G8918 Other acute postprocedural pain: Secondary | ICD-10-CM | POA: Insufficient documentation

## 2011-07-22 DIAGNOSIS — L7682 Other postprocedural complications of skin and subcutaneous tissue: Secondary | ICD-10-CM

## 2011-07-22 NOTE — ED Provider Notes (Signed)
History     70yF with increasing pain and mass at incision site of recent spinal surgery. Had spinal meningioma resected about 6w ago. Healing well with no significant complaints until 3d ago when noticed some discomfort at surgical site. No fever or chills. No numbness, weakness or loss of strength. No n/v. No bladder/bowel incontinence or retention. Nursing note reviewed but disagree with mention of redness. Pt asked and said doesn't know because not seen site since onset of this complaints.  CSN: [id number] Arrival date & time: 07/22/2011  4:07 PM   First MD Initiated Contact with Patient 07/22/11 1925      Chief Complaint  Patient presents with  . Incisional Pain    (Consider location/radiation/quality/duration/timing/severity/associated sxs/prior treatment) HPI  Past Medical History  Diagnosis Date  . Osteopenia   . 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  . Wears hearing aid     Past Surgical History  Procedure Date  . Abdominal hysterectomy 1973    1/2 of ovary left cyst on ovary  . Shoulder rotator culff  2006  . Spinal cord tumor 1987    Family History  Problem Relation Age of Onset  . Stroke Mother     2010  . Heart attack Father     History  Substance Use Topics  . Smoking status: Never Smoker   . Smokeless tobacco: Not on file  . Alcohol Use: Yes    OB History    Grav Para Term Preterm Abortions TAB SAB Ect Mult Living                  Review of Systems  Review of symptoms negative unless otherwise noted in HPI.   Allergies  Penicillins  Home Medications   Current Outpatient Rx  Name Route Sig Dispense Refill  . CALCIUM CARBONATE 1250 MG PO TABS Oral Take 1 tablet by mouth daily.      Marland Kitchen ONE-DAILY MULTI VITAMINS PO TABS Oral Take 1 tablet by mouth daily.      Marland Kitchen PREGABALIN 50 MG PO CAPS Oral Take 25 mg by mouth daily as needed. Nerve pain       BP 98/68  Pulse 98  Temp(Src) 97.8 F (36.6 C) (Oral)  Resp 16   SpO2 100%  Physical Exam  Nursing note and vitals reviewed. Constitutional: She appears well-developed and well-nourished. No distress.  HENT:  Head: Normocephalic and atraumatic.  Eyes: Conjunctivae are normal. Right eye exhibits no discharge. Left eye exhibits no discharge.  Neck: Neck supple.  Cardiovascular: Normal rate, regular rhythm and normal heart sounds.  Exam reveals no gallop and no friction rub.   No murmur heard. Pulmonary/Chest: Effort normal and breath sounds normal. No respiratory distress.  Abdominal: Soft. She exhibits no distension. There is no tenderness.  Musculoskeletal: She exhibits no edema and no tenderness.  Neurological: She is alert. She exhibits normal muscle tone. Coordination normal.       Normal gait.  Skin: Skin is warm and dry. She is not diaphoretic.       Midline surgical scar lower t spine/upper L spine area. Small collection under superior aspect of incision. Incision intact. No drainage. Minimal tenderness. No overlying skin changes.  Psychiatric: She has a normal mood and affect. Her behavior is normal. Thought content normal.    ED Course  Procedures (including critical care time)  Labs Reviewed - No data to display No results found.   1. Incisional pain  MDM  70yF with collection near superior aspect of incision. Consider seroma, hematoma, CSF leak, abscess. Nonfocal neuro exam and no neuro complaints. Afebrile. Collection with minimal erythema, only very mild tenderness and nontoxic. Less likley abscess. Discussed with neurosurgery. Feel that pt can be safely DC'd at this time and would like to see her in Dr Verlee Rossetti office tomorrow. Do not feel imaging or further testing needed at this time.         Raeford Razor, MD 07/25/11 [phone]

## 2011-07-22 NOTE — ED Notes (Signed)
Dr Juleen China at bedside. Pt was up for dischatge after he spoke with neurosugeon. Pt was to f/u in office tomorrow, however, pt advised Dr. Juleen China That she was flying out of town tomorrow and wont be back for a week. Dr Juleen China will call back neuro surgeon for revised plan.

## 2011-07-22 NOTE — ED Notes (Signed)
The pt had a tumor removed from her spinal cord 6 weeks ago.  She did well until 3-4 days ago when her pain increased in the incision  With swelling and redness and her surgeon cannot see here and told her to come here.

## 2011-09-02 ENCOUNTER — Ambulatory Visit (INDEPENDENT_AMBULATORY_CARE_PROVIDER_SITE_OTHER): Payer: Medicare Other | Admitting: Internal Medicine

## 2011-09-02 ENCOUNTER — Encounter: Payer: Self-pay | Admitting: Internal Medicine

## 2011-09-02 VITALS — HR 60 | Temp 97.8°F | Wt 106.0 lb

## 2011-09-02 DIAGNOSIS — R350 Frequency of micturition: Secondary | ICD-10-CM

## 2011-09-02 DIAGNOSIS — D497 Neoplasm of unspecified behavior of endocrine glands and other parts of nervous system: Secondary | ICD-10-CM

## 2011-09-02 DIAGNOSIS — R300 Dysuria: Secondary | ICD-10-CM

## 2011-09-02 LAB — POCT URINALYSIS DIPSTICK
Bilirubin, UA: NEGATIVE
Blood, UA: NEGATIVE
Glucose, UA: NEGATIVE
Ketones, UA: NEGATIVE
Nitrite, UA: NEGATIVE
Protein, UA: NEGATIVE
Spec Grav, UA: 1.015
Urobilinogen, UA: 0.2
pH, UA: 5

## 2011-09-02 MED ORDER — CIPROFLOXACIN HCL 500 MG PO TABS: 500.0000 mg | ORAL_TABLET | Freq: Two times a day (BID) | ORAL | Status: AC

## 2011-09-02 NOTE — Progress Notes (Signed)
  Subjective:    Patient ID: Kaitlyn Flowers, female    DOB: 1940/11/03, 70 y.o.   MRN: [id number]  HPI Patient comes in today for SDA  For acute problem evaluation. About a week  Has had some dysuria and some urgency  Waxing and waning  But  Has been  worse over the last 3 days and dysuria worse in am . No fever hematuria or flank pain or chills     S/p spinal cord surgery   For benign tumor removal   And went well.  October 7th.  Recovering and seemingly doing well at present  Review of Systems No fever chills  nvd new rash  no weakness or numbness. Rest of ROS negative or noncontributory  Past history family history social history reviewed and updated in the electronic medical record.     Objective:   Physical Exam  wdwn in nad  Looks well  No cva pain Incision site  Healed with central 3 cm bump no redness or drainage.  Ua  sm leuk       Assessment & Plan:  UTI   Probable   cx pending   Call results when available  Discussed treatment add Cipro 3-5 days. Culture pending  Status post benign tumor removal of the spinal cord doing well postop 2 months.

## 2011-09-02 NOTE — Patient Instructions (Signed)
This does  Act like a bladder infection  Take antibiotic  Will notify you  of labs when available.   Call if not better with med  Or as needed

## 2011-09-04 LAB — URINE CULTURE: Colony Count: 5000

## 2011-09-05 NOTE — Progress Notes (Signed)
Quick Note:  Left message to call back. ______ 

## 2011-09-09 NOTE — Progress Notes (Signed)
Quick Note:  Pt aware of results. ______ 

## 2011-09-27 ENCOUNTER — Encounter: Payer: Self-pay | Admitting: Internal Medicine

## 2011-09-27 ENCOUNTER — Ambulatory Visit (INDEPENDENT_AMBULATORY_CARE_PROVIDER_SITE_OTHER): Payer: Medicare Other | Admitting: Internal Medicine

## 2011-09-27 VITALS — BP 102/68 | Temp 98.7°F | Wt 106.0 lb

## 2011-09-27 DIAGNOSIS — N390 Urinary tract infection, site not specified: Secondary | ICD-10-CM

## 2011-09-27 DIAGNOSIS — R300 Dysuria: Secondary | ICD-10-CM

## 2011-09-27 LAB — POCT URINALYSIS DIPSTICK
Bilirubin, UA: NEGATIVE
Glucose, UA: NEGATIVE
Ketones, UA: NEGATIVE
Nitrite, UA: NEGATIVE
Protein, UA: NEGATIVE
Spec Grav, UA: <=1.005
Urobilinogen, UA: 0.2
pH, UA: 6

## 2011-09-27 MED ORDER — SULFAMETHOXAZOLE-TRIMETHOPRIM 800-160 MG PO TABS: 1.0000 | ORAL_TABLET | Freq: Two times a day (BID) | ORAL | Status: AC

## 2011-09-27 NOTE — Patient Instructions (Signed)
This acts like another UTI  And non alarming features otherwise but will do  Culture again to get more information.  Take 3 days of septra    Call   If continuing symptoms   Urinary Tract Infection Infections of the urinary tract can start in several places. A bladder infection (cystitis), a kidney infection (pyelonephritis), and a prostate infection (prostatitis) are different types of urinary tract infections (UTIs). They usually get better if treated with medicines (antibiotics) that kill germs. Take all the medicine until it is gone. You or your child may feel better in a few days, but TAKE ALL MEDICINE or the infection may not respond and may become more difficult to treat. HOME CARE INSTRUCTIONS   Drink enough water and fluids to keep the urine clear or pale yellow. Cranberry juice is especially recommended, in addition to large amounts of water.   Avoid caffeine, tea, and carbonated beverages. They tend to irritate the bladder.   Alcohol may irritate the prostate.   Only take over-the-counter or prescription medicines for pain, discomfort, or fever as directed by your caregiver.  To prevent further infections:  Empty the bladder often. Avoid holding urine for long periods of time.   After a bowel movement, women should cleanse from front to back. Use each tissue only once.   Empty the bladder before and after sexual intercourse.  FINDING OUT THE RESULTS OF YOUR TEST Not all test results are available during your visit. If your or your child's test results are not back during the visit, make an appointment with your caregiver to find out the results. Do not assume everything is normal if you have not heard from your caregiver or the medical facility. It is important for you to follow up on all test results. SEEK MEDICAL CARE IF:   There is back pain.   Your baby is older than 3 months with a rectal temperature of 100.5 F (38.1 C) or higher for more than 1 day.   Your or your  child's problems (symptoms) are no better in 3 days. Return sooner if you or your child is getting worse.  SEEK IMMEDIATE MEDICAL CARE IF:   There is severe back pain or lower abdominal pain.   You or your child develops chills.   You have a fever.   Your baby is older than 3 months with a rectal temperature of 102 F (38.9 C) or higher.   Your baby is 75 months old or younger with a rectal temperature of 100.4 F (38 C) or higher.   There is nausea or vomiting.   There is continued burning or discomfort with urination.  MAKE SURE YOU:   Understand these instructions.   Will watch your condition.   Will get help right away if you are not doing well or get worse.  Document Released: 05/29/2005 Document Revised: 05/01/2011 Document Reviewed: 01/01/2007 [facility] Patient Information 2012 [place], [place].

## 2011-09-27 NOTE — Progress Notes (Signed)
  Subjective:    Patient ID: Kaitlyn Flowers, female    DOB: 1941-08-17, 71 y.o.   MRN: [id number]  HPI Patient comes in today for SDA  For acute problem evaluation. See past notes   Was lots of better after cipro 3 days.    Was well until a few days ago when had onset of dysuria acute with feeling on fire no fever new flank pain chills NVD.  Back is stable following    Review of Systems No fever chills new rashes    Objective:   Physical Exam wdwn in nad No cva pain  See ua mod leuk and blood  Sent for cx  Reviewed past record  Low colony count not ided      Assessment & Plan:  Acute uti  Recurrent  But no other contributing factor seen  Disc above  rx with septra ds x 3 days   recx and fu as approriate . Does not seem related to her back surgery

## 2011-09-29 LAB — URINE CULTURE

## 2011-10-01 NOTE — Progress Notes (Signed)
Quick Note:  Tell pt urine culture this time showed multiple bacteria but not predominant germ they can identify. . Again . Please assess whether her sx are better again. . ______

## 2011-10-02 NOTE — Progress Notes (Signed)
Quick Note:  Left message to call back. ______ 

## 2011-10-03 NOTE — Progress Notes (Signed)
Quick Note:  Pt took all the medication and she is getting better. Pt aware of the results. ______

## 2011-11-01 ENCOUNTER — Other Ambulatory Visit: Payer: Self-pay | Admitting: Neurological Surgery

## 2011-11-01 DIAGNOSIS — M546 Pain in thoracic spine: Secondary | ICD-10-CM

## 2011-11-11 ENCOUNTER — Encounter: Payer: Self-pay | Admitting: Internal Medicine

## 2011-11-11 ENCOUNTER — Ambulatory Visit (INDEPENDENT_AMBULATORY_CARE_PROVIDER_SITE_OTHER): Payer: Medicare Other | Admitting: Internal Medicine

## 2011-11-11 VITALS — BP 120/80 | HR 72 | Wt 106.0 lb

## 2011-11-11 DIAGNOSIS — G9782 Other postprocedural complications and disorders of nervous system: Secondary | ICD-10-CM | POA: Insufficient documentation

## 2011-11-11 DIAGNOSIS — R5381 Other malaise: Secondary | ICD-10-CM

## 2011-11-11 DIAGNOSIS — D649 Anemia, unspecified: Secondary | ICD-10-CM

## 2011-11-11 DIAGNOSIS — G988 Other disorders of nervous system: Secondary | ICD-10-CM

## 2011-11-11 DIAGNOSIS — Z8669 Personal history of other diseases of the nervous system and sense organs: Secondary | ICD-10-CM

## 2011-11-11 DIAGNOSIS — Z86018 Personal history of other benign neoplasm: Secondary | ICD-10-CM | POA: Insufficient documentation

## 2011-11-11 DIAGNOSIS — R5383 Other fatigue: Secondary | ICD-10-CM

## 2011-11-11 DIAGNOSIS — G9600 Cerebrospinal fluid leak, unspecified: Secondary | ICD-10-CM

## 2011-11-11 LAB — CBC WITH DIFFERENTIAL/PLATELET
Basophils Absolute: 0 10*3/uL (ref 0.0–0.1)
Basophils Relative: 0.7 % (ref 0.0–3.0)
Eosinophils Absolute: 0.1 10*3/uL (ref 0.0–0.7)
Eosinophils Relative: 1 % (ref 0.0–5.0)
HCT: 32 % — ABNORMAL LOW (ref 36.0–46.0)
Hemoglobin: 10.9 g/dL — ABNORMAL LOW (ref 12.0–15.0)
Lymphocytes Relative: 33.2 % (ref 12.0–46.0)
Lymphs Abs: 1.7 10*3/uL (ref 0.7–4.0)
MCHC: 34.2 g/dL (ref 30.0–36.0)
MCV: 100.2 fl — ABNORMAL HIGH (ref 78.0–100.0)
Monocytes Absolute: 0.4 10*3/uL (ref 0.1–1.0)
Monocytes Relative: 7.4 % (ref 3.0–12.0)
Neutro Abs: 3 10*3/uL (ref 1.4–7.7)
Neutrophils Relative %: 57.7 % (ref 43.0–77.0)
Platelets: 212 10*3/uL (ref 150.0–400.0)
RBC: 3.2 Mil/uL — ABNORMAL LOW (ref 3.87–5.11)
RDW: 14.7 % — ABNORMAL HIGH (ref 11.5–14.6)
WBC: 5.3 10*3/uL (ref 4.5–10.5)

## 2011-11-11 LAB — HEPATIC FUNCTION PANEL
ALT: 16 U/L (ref 0–35)
AST: 23 U/L (ref 0–37)
Albumin: 4.3 g/dL (ref 3.5–5.2)
Alkaline Phosphatase: 42 U/L (ref 39–117)
Bilirubin, Direct: 0 mg/dL (ref 0.0–0.3)
Total Bilirubin: 0.4 mg/dL (ref 0.3–1.2)
Total Protein: 6.4 g/dL (ref 6.0–8.3)

## 2011-11-11 LAB — BASIC METABOLIC PANEL
BUN: 15 mg/dL (ref 6–23)
CO2: 28 mEq/L (ref 19–32)
Calcium: 9.6 mg/dL (ref 8.4–10.5)
Chloride: 104 mEq/L (ref 96–112)
Creatinine, Ser: 0.8 mg/dL (ref 0.4–1.2)
GFR: 78.87 mL/min (ref >60.00)
Glucose, Bld: 83 mg/dL (ref 70–99)
Potassium: 3.8 mEq/L (ref 3.5–5.1)
Sodium: 140 mEq/L (ref 135–145)

## 2011-11-11 LAB — IBC PANEL
Iron: 86 ug/dL (ref 42–145)
Saturation Ratios: 25.2 % (ref 20.0–50.0)
Transferrin: 243.4 mg/dL (ref 212.0–360.0)

## 2011-11-11 LAB — T4, FREE: Free T4: 0.96 ng/dL (ref 0.60–1.60)

## 2011-11-11 LAB — TSH: TSH: 1.11 u[IU]/mL (ref 0.35–5.50)

## 2011-11-11 LAB — SEDIMENTATION RATE: Sed Rate: 10 mm/hr (ref 0–22)

## 2011-11-11 LAB — T3, FREE: T3, Free: 2.8 pg/mL (ref 2.3–4.2)

## 2011-11-11 NOTE — Progress Notes (Signed)
  Subjective:    Patient ID: Kaitlyn Flowers, female    DOB: February 25, 1941, 71 y.o.   MRN: [id number]  HPI Patient comes in today for   new problem evaluation. To get MRI of back  baecause of   Leak   Post op. However sent  When co to Dr Danielle Dess of fatigue .  2 months of exhaustion that is atypical for her. After a few hours in the am feels very tired ? If physical of mental energy issue.  No has and no vision changes.  Better  After a few hours if  lays down and .  Rest at times.  Worse when gets really hungry    And late on eating.  Surgery was October  And was full time   Working in January.   Able  To sleep  7-8 hours .  Exercise  Walking  3 miles  5 x per week.  Feels good right away.    Review of Systems Neg cp sob syncope bleeding vision hearing  depr anxiety   Sleeps ok sweats or weight loss  Was denied from long term insurance for anemia .   Past history family history social history reviewed in the electronic medical record.     Objective:   Physical Exam  WDWN in nad  Oriented x 3 and no noted deficits in memory, attention, and speech. HEENT: Normocephalic ;atraumatic , Eyes;  PERRL, EOMs  Full, lids and conjunctiva clear,,Ears: no deformities, canals nl, TM landmarks normal,  Hearing aid Nose: no deformity or discharge  Mouth : OP clear without lesion or edema . Neck: Supple without adenopathy or masses or bruits Chest:  Clear to A&P without wheezes rales or rhonchi CV:  S1-S2 no gallops or murmurs peripheral perfusion is normal Abdomen:  Sof,t normal bowel sounds without hepatosplenomegaly, no guarding rebound or masses no CVA tenderness SKIN nl turgor  Back small but protruding soft area at mid lower thoracic spine.      Assessment & Plan:  Fatigue Poss multifactorial  No compromise of cv pulm systems Check anemia and tsh etc.   Review of data shows nl vit b 12,folate in the past and borderline normal ibc.   Post op [facility] csf leak    No HA or hypotension or  infection.  Under eval.

## 2011-11-11 NOTE — Patient Instructions (Signed)
Your exam is normal  Will notify you  of labs when available.  You may have increase sleep needs  Temporarily.

## 2011-11-12 ENCOUNTER — Ambulatory Visit
Admission: RE | Admit: 2011-11-12 | Discharge: 2011-11-12 | Disposition: A | Discharge status: Home / Self Care | Payer: Medicare Other | Source: Ambulatory Visit | Attending: Neurological Surgery | Admitting: Neurological Surgery

## 2011-11-12 DIAGNOSIS — M546 Pain in thoracic spine: Secondary | ICD-10-CM

## 2011-11-12 LAB — CMV IGM: CMV IgM: 0.95 — ABNORMAL HIGH (ref <0.90)

## 2011-11-12 LAB — EPSTEIN-BARR VIRUS VCA ANTIBODY PANEL
EBV EA IgG: 0.39 {ISR}
EBV NA IgG: 2.72 {ISR} — ABNORMAL HIGH
EBV VCA IgG: 2.8 {ISR} — ABNORMAL HIGH
EBV VCA IgM: 0.15 {ISR}

## 2011-11-12 MED ORDER — GADOBENATE DIMEGLUMINE 529 MG/ML IV SOLN
9.0000 mL | Freq: Once | INTRAVENOUS | Status: AC | PRN
  Administered 2011-11-12: 9 mL via INTRAVENOUS

## 2011-11-13 LAB — HEMOGLOBINOPATHY EVALUATION
Hemoglobin Other: 0 %
Hgb A2 Quant: 2.5 % (ref 2.2–3.2)
Hgb A: 97.2 % (ref 96.8–97.8)
Hgb F Quant: 0.3 % (ref 0.0–2.0)
Hgb S Quant: 0 %

## 2011-11-13 LAB — IMMUNOFIXATION ELECTROPHORESIS
IgA: 253 mg/dL (ref 69–380)
IgG (Immunoglobin G), Serum: 636 mg/dL — ABNORMAL LOW (ref 690–1700)
IgM, Serum: 58 mg/dL (ref 52–322)
Total Protein, Serum Electrophoresis: 6.5 g/dL (ref 6.0–8.3)

## 2011-11-14 ENCOUNTER — Other Ambulatory Visit: Payer: Self-pay | Admitting: Neurological Surgery

## 2011-11-18 NOTE — Progress Notes (Signed)
Quick Note:  Left message to call back. ______ 

## 2011-11-20 ENCOUNTER — Other Ambulatory Visit: Payer: Self-pay | Admitting: Internal Medicine

## 2011-11-20 DIAGNOSIS — D649 Anemia, unspecified: Secondary | ICD-10-CM

## 2011-11-20 DIAGNOSIS — Z20828 Contact with and (suspected) exposure to other viral communicable diseases: Secondary | ICD-10-CM

## 2011-11-20 NOTE — Progress Notes (Signed)
Quick Note:  Pt aware of results. ______ 

## 2011-11-26 ENCOUNTER — Encounter (HOSPITAL_COMMUNITY): Payer: Self-pay

## 2011-11-26 ENCOUNTER — Encounter (HOSPITAL_COMMUNITY): Payer: Self-pay | Admitting: Respiratory Therapy

## 2011-11-26 ENCOUNTER — Encounter (HOSPITAL_COMMUNITY)
Admission: RE | Admit: 2011-11-26 | Discharge: 2011-11-26 | Disposition: A | Discharge status: Home / Self Care | Payer: Medicare Other | Source: Ambulatory Visit | Attending: Neurological Surgery | Admitting: Neurological Surgery

## 2011-11-26 ENCOUNTER — Other Ambulatory Visit (HOSPITAL_COMMUNITY): Payer: Medicare Other

## 2011-11-26 HISTORY — DX: Anemia, unspecified: D64.9

## 2011-11-26 HISTORY — DX: Other specified postprocedural states: Z98.890

## 2011-11-26 HISTORY — DX: Nausea with vomiting, unspecified: R11.2

## 2011-11-26 LAB — SURGICAL PCR SCREEN
MRSA, PCR: NEGATIVE
Staphylococcus aureus: NEGATIVE

## 2011-11-26 LAB — CBC
HCT: 32.7 % — ABNORMAL LOW (ref 36.0–46.0)
Hemoglobin: 11.3 g/dL — ABNORMAL LOW (ref 12.0–15.0)
MCH: 33.6 pg (ref 26.0–34.0)
MCHC: 34.6 g/dL (ref 30.0–36.0)
MCV: 97.3 fL (ref 78.0–100.0)
Platelets: 207 10*3/uL (ref 150–400)
RBC: 3.36 MIL/uL — ABNORMAL LOW (ref 3.87–5.11)
RDW: 13.7 % (ref 11.5–15.5)
WBC: 6.9 10*3/uL (ref 4.0–10.5)

## 2011-11-26 NOTE — Pre-Procedure Instructions (Signed)
20 Damali Broadfoot  11/26/2011   Your procedure is scheduled on:  December 06, 2011  Report to [facility] at 0930 AM.  Call this number if you have problems the morning of surgery: [phone]   Remember:   Do not eat food:After Midnight.  May have clear liquids: up to 4 Hours before arrival.  Clear liquids include soda, tea, black coffee, apple or grape juice, broth.  Take these medicines the morning of surgery with A SIP OF WATER: None   STOP all medications November 29, 2011  Do not wear jewelry, make-up or nail polish.  Do not wear lotions, powders, or perfumes. You may wear deodorant.  Do not shave 48 hours prior to surgery.  Do not bring valuables to the hospital.  Contacts, dentures or bridgework may not be worn into surgery.  Leave suitcase in the car. After surgery it may be brought to your room.  For patients admitted to the hospital, checkout time is 11:00 AM the day of discharge.   Patients discharged the day of surgery will not be allowed to drive home.  Name and phone number of your driver: Caryn Bee  Special Instructions: CHG Shower Use Special Wash: 1/2 bottle night before surgery and 1/2 bottle morning of surgery.   Please read over the following fact sheets that you were given: Pain Booklet, Coughing and Deep Breathing and Surgical Site Infection Prevention

## 2011-12-05 MED ORDER — VANCOMYCIN HCL IN DEXTROSE 1-5 GM/200ML-% IV SOLN
1000.0000 mg | INTRAVENOUS | Status: AC
  Administered 2011-12-06: 1000 mg via INTRAVENOUS
  Filled 2011-12-05: qty 200

## 2011-12-06 ENCOUNTER — Ambulatory Visit (HOSPITAL_COMMUNITY): Payer: Medicare Other | Admitting: Certified Registered"

## 2011-12-06 ENCOUNTER — Encounter (HOSPITAL_COMMUNITY): Payer: Self-pay | Admitting: *Deleted

## 2011-12-06 ENCOUNTER — Encounter (HOSPITAL_COMMUNITY)
Admission: RE | Disposition: A | Discharge status: Home / Self Care | Payer: Self-pay | Source: Ambulatory Visit | Attending: Neurological Surgery

## 2011-12-06 ENCOUNTER — Inpatient Hospital Stay (HOSPITAL_COMMUNITY)
Admission: RE | Admit: 2011-12-06 | Discharge: 2011-12-06 | DRG: 030 | Disposition: A | Discharge status: Home / Self Care | Payer: Medicare Other | Source: Ambulatory Visit | Attending: Neurological Surgery | Admitting: Neurological Surgery

## 2011-12-06 ENCOUNTER — Encounter (HOSPITAL_COMMUNITY): Payer: Self-pay | Admitting: Certified Registered"

## 2011-12-06 DIAGNOSIS — D649 Anemia, unspecified: Secondary | ICD-10-CM | POA: Diagnosis present

## 2011-12-06 DIAGNOSIS — Z01812 Encounter for preprocedural laboratory examination: Secondary | ICD-10-CM

## 2011-12-06 DIAGNOSIS — Z79899 Other long term (current) drug therapy: Secondary | ICD-10-CM

## 2011-12-06 DIAGNOSIS — Y838 Other surgical procedures as the cause of abnormal reaction of the patient, or of later complication, without mention of misadventure at the time of the procedure: Secondary | ICD-10-CM | POA: Diagnosis present

## 2011-12-06 DIAGNOSIS — Z87891 Personal history of nicotine dependence: Secondary | ICD-10-CM

## 2011-12-06 DIAGNOSIS — M899 Disorder of bone, unspecified: Secondary | ICD-10-CM | POA: Diagnosis present

## 2011-12-06 DIAGNOSIS — G96198 Other disorders of meninges, not elsewhere classified: Secondary | ICD-10-CM | POA: Diagnosis present

## 2011-12-06 DIAGNOSIS — G969 Disorder of central nervous system, unspecified: Principal | ICD-10-CM | POA: Diagnosis present

## 2011-12-06 DIAGNOSIS — G9782 Other postprocedural complications and disorders of nervous system: Secondary | ICD-10-CM

## 2011-12-06 HISTORY — PX: LUMBAR WOUND DEBRIDEMENT: SHX1988

## 2011-12-06 SURGERY — LUMBAR WOUND DEBRIDEMENT
Anesthesia: General

## 2011-12-06 MED ORDER — BACITRACIN 50000 UNITS IM SOLR
INTRAMUSCULAR | Status: AC
  Filled 2011-12-06: qty 1

## 2011-12-06 MED ORDER — DEXAMETHASONE SODIUM PHOSPHATE 4 MG/ML IJ SOLN
INTRAMUSCULAR | Status: DC | PRN
  Administered 2011-12-06: 4 mg via INTRAVENOUS

## 2011-12-06 MED ORDER — KETOROLAC TROMETHAMINE 30 MG/ML IJ SOLN
INTRAMUSCULAR | Status: AC
  Filled 2011-12-06: qty 1

## 2011-12-06 MED ORDER — DIAZEPAM 5 MG PO TABS: 5.0000 mg | ORAL_TABLET | Freq: Four times a day (QID) | ORAL | Status: DC | PRN

## 2011-12-06 MED ORDER — PHENYLEPHRINE HCL 10 MG/ML IJ SOLN
INTRAMUSCULAR | Status: DC | PRN
  Administered 2011-12-06 (×6): 80 ug via INTRAVENOUS

## 2011-12-06 MED ORDER — ALUM & MAG HYDROXIDE-SIMETH 200-200-20 MG/5ML PO SUSP: 30.0000 mL | Freq: Four times a day (QID) | ORAL | Status: DC | PRN

## 2011-12-06 MED ORDER — OXYCODONE-ACETAMINOPHEN 5-325 MG PO TABS: 1.0000 | ORAL_TABLET | ORAL | Status: DC | PRN

## 2011-12-06 MED ORDER — SODIUM CHLORIDE 0.9 % IJ SOLN: 3.0000 mL | INTRAMUSCULAR | Status: DC | PRN

## 2011-12-06 MED ORDER — OXYCODONE-ACETAMINOPHEN 5-325 MG PO TABS
1.0000 | ORAL_TABLET | ORAL | Status: AC | PRN
Start: 2011-12-06 — End: 2011-12-16

## 2011-12-06 MED ORDER — MORPHINE SULFATE 4 MG/ML IJ SOLN: 1.0000 mg | INTRAMUSCULAR | Status: DC | PRN

## 2011-12-06 MED ORDER — HYDROMORPHONE HCL PF 1 MG/ML IJ SOLN
INTRAMUSCULAR | Status: AC
  Filled 2011-12-06: qty 1

## 2011-12-06 MED ORDER — SCOPOLAMINE 1 MG/3DAYS TD PT72
MEDICATED_PATCH | TRANSDERMAL | Status: DC | PRN
  Administered 2011-12-06: 1 via TRANSDERMAL

## 2011-12-06 MED ORDER — ONDANSETRON HCL 4 MG/2ML IJ SOLN: 4.0000 mg | Freq: Once | INTRAMUSCULAR | Status: DC | PRN

## 2011-12-06 MED ORDER — SODIUM CHLORIDE 0.9 % IV SOLN: 250.0000 mL | INTRAVENOUS | Status: DC

## 2011-12-06 MED ORDER — BACITRACIN 50000 UNITS IM SOLR
INTRAMUSCULAR | Status: DC | PRN
  Administered 2011-12-06: 13:00:00

## 2011-12-06 MED ORDER — HEMOSTATIC AGENTS (NO CHARGE) OPTIME
TOPICAL | Status: DC | PRN
  Administered 2011-12-06: 1 via TOPICAL

## 2011-12-06 MED ORDER — KETOROLAC TROMETHAMINE 15 MG/ML IJ SOLN
15.0000 mg | Freq: Four times a day (QID) | INTRAMUSCULAR | Status: DC
  Administered 2011-12-06: 15 mg via INTRAVENOUS

## 2011-12-06 MED ORDER — 0.9 % SODIUM CHLORIDE (POUR BTL) OPTIME
TOPICAL | Status: DC | PRN
  Administered 2011-12-06: 1000 mL

## 2011-12-06 MED ORDER — MIDAZOLAM HCL 5 MG/5ML IJ SOLN
INTRAMUSCULAR | Status: DC | PRN
  Administered 2011-12-06 (×2): 1 mg via INTRAVENOUS

## 2011-12-06 MED ORDER — ACETAMINOPHEN 650 MG RE SUPP: 650.0000 mg | RECTAL | Status: DC | PRN

## 2011-12-06 MED ORDER — FENTANYL CITRATE 0.05 MG/ML IJ SOLN
INTRAMUSCULAR | Status: DC | PRN
  Administered 2011-12-06 (×2): 25 ug via INTRAVENOUS
  Administered 2011-12-06: 125 ug via INTRAVENOUS

## 2011-12-06 MED ORDER — HYDROMORPHONE HCL PF 1 MG/ML IJ SOLN
0.2500 mg | INTRAMUSCULAR | Status: DC | PRN
  Administered 2011-12-06 (×2): 0.5 mg via INTRAVENOUS

## 2011-12-06 MED ORDER — ONDANSETRON HCL 4 MG/2ML IJ SOLN
INTRAMUSCULAR | Status: DC | PRN
  Administered 2011-12-06: 4 mg via INTRAVENOUS

## 2011-12-06 MED ORDER — PROPOFOL 10 MG/ML IV EMUL
INTRAVENOUS | Status: DC | PRN
  Administered 2011-12-06: 120 mg via INTRAVENOUS

## 2011-12-06 MED ORDER — ACETAMINOPHEN 325 MG PO TABS: 650.0000 mg | ORAL_TABLET | ORAL | Status: DC | PRN

## 2011-12-06 MED ORDER — ROCURONIUM BROMIDE 100 MG/10ML IV SOLN
INTRAVENOUS | Status: DC | PRN
  Administered 2011-12-06: 50 mg via INTRAVENOUS

## 2011-12-06 MED ORDER — SODIUM CHLORIDE 0.9 % IV SOLN
INTRAVENOUS | Status: DC | PRN
  Administered 2011-12-06: 13:00:00 via INTRAVENOUS

## 2011-12-06 MED ORDER — MORPHINE SULFATE 2 MG/ML IJ SOLN: 0.0500 mg/kg | INTRAMUSCULAR | Status: DC | PRN

## 2011-12-06 MED ORDER — LIDOCAINE HCL (CARDIAC) 20 MG/ML IV SOLN
INTRAVENOUS | Status: DC | PRN
  Administered 2011-12-06: 100 mg via INTRAVENOUS

## 2011-12-06 MED ORDER — ONDANSETRON HCL 4 MG/2ML IJ SOLN: 4.0000 mg | INTRAMUSCULAR | Status: DC | PRN

## 2011-12-06 MED ORDER — PHENOL 1.4 % MT LIQD: 1.0000 | OROMUCOSAL | Status: DC | PRN

## 2011-12-06 MED ORDER — THROMBIN 5000 UNITS EX SOLR
CUTANEOUS | Status: DC | PRN
  Administered 2011-12-06 (×2): 5000 [IU] via TOPICAL

## 2011-12-06 MED ORDER — SODIUM CHLORIDE 0.9 % IV SOLN
INTRAVENOUS | Status: AC
  Filled 2011-12-06: qty 500

## 2011-12-06 MED ORDER — SODIUM CHLORIDE 0.9 % IJ SOLN: 3.0000 mL | Freq: Two times a day (BID) | INTRAMUSCULAR | Status: DC

## 2011-12-06 MED ORDER — LACTATED RINGERS IV SOLN
INTRAVENOUS | Status: DC | PRN
  Administered 2011-12-06 (×2): via INTRAVENOUS

## 2011-12-06 MED ORDER — MENTHOL 3 MG MT LOZG: 1.0000 | LOZENGE | OROMUCOSAL | Status: DC | PRN

## 2011-12-06 SURGICAL SUPPLY — 61 items
ADH SKN CLS APL DERMABOND .7 (GAUZE/BANDAGES/DRESSINGS) ×1
APL SKNCLS STERI-STRIP NONHPOA (GAUZE/BANDAGES/DRESSINGS)
BAG DECANTER FOR FLEXI CONT (MISCELLANEOUS) ×2 IMPLANT
BENZOIN TINCTURE PRP APPL 2/3 (GAUZE/BANDAGES/DRESSINGS) IMPLANT
BLADE SURG ROTATE 9660 (MISCELLANEOUS) IMPLANT
CANISTER SUCTION 2500CC (MISCELLANEOUS) ×2 IMPLANT
CLOTH BEACON ORANGE TIMEOUT ST (SAFETY) ×2 IMPLANT
CONT SPEC 4OZ CLIKSEAL STRL BL (MISCELLANEOUS) ×2 IMPLANT
CORDS BIPOLAR (ELECTRODE) ×2 IMPLANT
DECANTER SPIKE VIAL GLASS SM (MISCELLANEOUS) ×2 IMPLANT
DERMABOND ADVANCED (GAUZE/BANDAGES/DRESSINGS) ×1
DERMABOND ADVANCED .7 DNX12 (GAUZE/BANDAGES/DRESSINGS) IMPLANT
DRAPE LAPAROTOMY 100X72X124 (DRAPES) ×2 IMPLANT
DRAPE POUCH INSTRU U-SHP 10X18 (DRAPES) ×2 IMPLANT
DRESSING TELFA 8X3 (GAUZE/BANDAGES/DRESSINGS) ×2 IMPLANT
DURAFORM COLLAGEN 1X1 5-PACK (Neuro Prosthesis/Implant) ×1 IMPLANT
DURAPREP 26ML APPLICATOR (WOUND CARE) ×2 IMPLANT
ELECT REM PT RETURN 9FT ADLT (ELECTROSURGICAL) ×2
ELECTRODE REM PT RTRN 9FT ADLT (ELECTROSURGICAL) ×1 IMPLANT
GAUZE SPONGE 4X4 16PLY XRAY LF (GAUZE/BANDAGES/DRESSINGS) IMPLANT
GLOVE BIO SURGEON STRL SZ7.5 (GLOVE) IMPLANT
GLOVE BIOGEL PI IND STRL 7.0 (GLOVE) IMPLANT
GLOVE BIOGEL PI IND STRL 7.5 (GLOVE) IMPLANT
GLOVE BIOGEL PI IND STRL 8.5 (GLOVE) ×1 IMPLANT
GLOVE BIOGEL PI INDICATOR 7.0 (GLOVE) ×1
GLOVE BIOGEL PI INDICATOR 7.5 (GLOVE)
GLOVE BIOGEL PI INDICATOR 8.5 (GLOVE) ×1
GLOVE ECLIPSE 8.5 STRL (GLOVE) ×2 IMPLANT
GLOVE EXAM NITRILE LRG STRL (GLOVE) IMPLANT
GLOVE EXAM NITRILE MD LF STRL (GLOVE) IMPLANT
GLOVE EXAM NITRILE XL STR (GLOVE) IMPLANT
GLOVE EXAM NITRILE XS STR PU (GLOVE) IMPLANT
GLOVE OPTIFIT SS 6.5 STRL BRWN (GLOVE) ×2 IMPLANT
GOWN BRE IMP SLV AUR LG STRL (GOWN DISPOSABLE) ×2 IMPLANT
GOWN BRE IMP SLV AUR XL STRL (GOWN DISPOSABLE) ×1 IMPLANT
GOWN STRL REIN 2XL LVL4 (GOWN DISPOSABLE) ×2 IMPLANT
KIT BASIN OR (CUSTOM PROCEDURE TRAY) ×2 IMPLANT
KIT ROOM TURNOVER OR (KITS) ×2 IMPLANT
MARKER SKIN DUAL TIP RULER LAB (MISCELLANEOUS) ×2 IMPLANT
NEEDLE HYPO 22GX1.5 SAFETY (NEEDLE) ×2 IMPLANT
NS IRRIG 1000ML POUR BTL (IV SOLUTION) ×2 IMPLANT
PACK LAMINECTOMY NEURO (CUSTOM PROCEDURE TRAY) ×2 IMPLANT
PAD ARMBOARD 7.5X6 YLW CONV (MISCELLANEOUS) ×2 IMPLANT
SPONGE GAUZE 4X4 12PLY (GAUZE/BANDAGES/DRESSINGS) ×2 IMPLANT
SPONGE LAP 4X18 X RAY DECT (DISPOSABLE) IMPLANT
SPONGE SURGIFOAM ABS GEL SZ50 (HEMOSTASIS) ×2 IMPLANT
STAPLER SKIN PROX WIDE 3.9 (STAPLE) IMPLANT
STRIP CLOSURE SKIN 1/2X4 (GAUZE/BANDAGES/DRESSINGS) IMPLANT
SUT PROLENE 6 0 BV (SUTURE) ×1 IMPLANT
SUT VIC AB 0 CT1 18XCR BRD8 (SUTURE) ×1 IMPLANT
SUT VIC AB 0 CT1 8-18 (SUTURE) ×2
SUT VIC AB 2-0 CP2 18 (SUTURE) ×4 IMPLANT
SUT VIC AB 2-0 CT1 18 (SUTURE) ×3 IMPLANT
SUT VIC AB 3-0 SH 8-18 (SUTURE) ×1 IMPLANT
SWAB CULTURE LIQ STUART DBL (MISCELLANEOUS) IMPLANT
SYR 20ML ECCENTRIC (SYRINGE) ×2 IMPLANT
SYR CONTROL 10ML LL (SYRINGE) ×2 IMPLANT
TOWEL OR 17X24 6PK STRL BLUE (TOWEL DISPOSABLE) ×2 IMPLANT
TOWEL OR 17X26 10 PK STRL BLUE (TOWEL DISPOSABLE) ×2 IMPLANT
TUBE ANAEROBIC SPECIMEN COL (MISCELLANEOUS) IMPLANT
WATER STERILE IRR 1000ML POUR (IV SOLUTION) ×2 IMPLANT

## 2011-12-06 NOTE — Anesthesia Procedure Notes (Signed)
Procedure Name: Intubation Date/Time: 12/06/2011 12:59 PM Performed by: Darcey Nora B Pre-anesthesia Checklist: Patient identified, Emergency Drugs available, Suction available and Patient being monitored Patient Re-evaluated:Patient Re-evaluated prior to inductionOxygen Delivery Method: Circle system utilized Intubation Type: IV induction Ventilation: Mask ventilation without difficulty Laryngoscope Size: Mac and 3 Grade View: Grade I Tube type: Oral Tube size: 7.0 mm Number of attempts: 1 Airway Equipment and Method: Stylet Placement Confirmation: ETT inserted through vocal cords under direct vision,  positive ETCO2 and breath sounds checked- equal and bilateral Secured at: 21 (cm at teeth) cm Tube secured with: Tape Dental Injury: Teeth and Oropharynx as per pre-operative assessment

## 2011-12-06 NOTE — Preoperative (Signed)
Beta Blockers   Reason not to administer Beta Blockers:Not Applicable 

## 2011-12-06 NOTE — Anesthesia Postprocedure Evaluation (Signed)
Anesthesia Post Note  Patient: Kaitlyn Flowers  Procedure(s) Performed: Procedure(s) (LRB): LUMBAR WOUND DEBRIDEMENT (N/A)  Anesthesia type: general  Patient location: PACU  Post pain: Pain level controlled  Post assessment: Patient's Cardiovascular Status Stable  Last Vitals:  Filed Vitals:   12/06/11 1430  BP: 135/69  Pulse: 79  Temp:   Resp: 19    Post vital signs: Reviewed and stable  Level of consciousness: sedated  Complications: No apparent anesthesia complications

## 2011-12-06 NOTE — Anesthesia Preprocedure Evaluation (Signed)
Anesthesia Evaluation  Patient identified by MRN, date of birth, ID band  Reviewed: Allergy & Precautions, H&P , NPO status , Patient's Chart, lab work & pertinent test results  History of Anesthesia Complications (+) PONV  Airway Mallampati: I TM Distance: >3 FB Neck ROM: Full    Dental   Pulmonary          Cardiovascular     Neuro/Psych    GI/Hepatic   Endo/Other    Renal/GU      Musculoskeletal   Abdominal   Peds  Hematology   Anesthesia Other Findings   Reproductive/Obstetrics                           Anesthesia Physical Anesthesia Plan  ASA: II  Anesthesia Plan: General   Post-op Pain Management:    Induction: Intravenous  Airway Management Planned: Oral ETT  Additional Equipment:   Intra-op Plan:   Post-operative Plan: Extubation in OR  Informed Consent: I have reviewed the patients History and Physical, chart, labs and discussed the procedure including the risks, benefits and alternatives for the proposed anesthesia with the patient or authorized representative who has indicated his/her understanding and acceptance.     Plan Discussed with: CRNA and Surgeon  Anesthesia Plan Comments: (Scopolamine patch for PONV)        Anesthesia Quick Evaluation

## 2011-12-06 NOTE — H&P (Signed)
Kaitlyn Flowers is an 71 y.o. female.   Chief Complaint: Pseudomeningocele of thoracic spine HPI: No changes from below dictation Kaitlyn Flowers  #[id number] DOB:  03-05-1941 11/13/2011:     Kaitlyn Flowers returns to the office today.  She had an MRI of the thoracic spine to evaluate the pseudomeningocele.  She has a fairly large pseudomeningocele which is measured at 35 mm in depth and 90 mm in length.  There is no discrete connection between the subarachnoid space and no rent in the dura can be identified.  There are no areas of significant tissue necrosis.  There are areas of fibrosis around the edges of the lesion itself. I noted that the skin overlying her pseudomeningocele is rather thin.  We discussed the findings.  Because of the size of the pseudomeningocele and the fact that she has very thin covering over most of the pseudomeningocele, I would advise that this be repaired.  Kaitlyn Flowers is wanting to maintain a very active lifestyle and I noted that there is a risk that this could break through the skin and cause a leak in which case it becomes an urgent matter.  I believe that elective repair of the pseudomeningocele and reapproximation of the tissues would be her best option.  We discussed the major risk of the procedure which is infection.  That not withstanding, I believe that she is at some risk of infection should she spring a leak in this lesion.  We will schedule repair at her convenience.           Kaitlyn Flowers, M.D./gde  NEUROSURGICAL CONSULTATION  Kaitlyn Flowers  #[id number]  DOB:  Mar 24, 1941     December 12, 2010  CHIEF COMPLAINT:   Back pain and sporadic leg pain.    HISTORY OF PRESENT ILLNESS:  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  She tells me that she has had increasing back pain, particularly worse with sitting while driving a car since about February of this year.  She has had a workup including MRIs of the thoracic and lumbar spines and  she brings these with her.  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  There are some modest modic changes in the endplates at each of the levels, save for 5-1.  There is some posterior disc bulging and a small annular tear centrally at L3-L4.  There is no evidence of any compromise of the nerve roots and the foramina appear widely patent at every level in the lumbar spine.  The more significant finding, however, is seen at the level of T12-L1 where the patient has had a previous laminectomy.  She tells me that back in the 1980's, she underwent surgical decompression of a lesion that she was told was a meningioma.  She was told that it was intertwined with the nerves and a subtotal resection was performed and she had some subsequent radiation treatments.  She was followed for a number of years up until the early 1990's with an occasional study and an MRI, but since about 38 or the early 1990's, she has not had any further studies nor has she kept any record of the findings from that time.  Over the years, she has done quite well with her back and this problem has just become recently more intolerable.  She notes that sitting  has always been a problem for a more prolonged period of time in the car.  She could typically tolerate a 3-hour trip, but since about February of this year, she notes that her tolerance to sitting is very limited and almost immediately from the time she gets in the car, she starts to have symptoms.  Leg pain will be there to a variable degree and typically it goes down to both legs in the regions of the thighs mostly.  She finds that she can lessen the symptoms if she leans forward quite significantly.  She has not noted any weakness in the legs.  She does not complain of any numbness in the legs.  She has not had any problems with bowel or bladder  control.    The MRI at this time demonstrates that there is evidence of a lesion in the right half of the canal.  This displaces the spinal cord off to the left right at the level of the conus.  The lesion measures 1.2 x 1.2 x 1 cm.    PAST MEDICAL HISTORY:  The patient's general health has been excellent  PAST SURGICAL HISTORY:  Her surgery includes a hysterectomy back in 1972 and she had a benign cyst on one ovary.  She had shoulder surgery in 2007 and the spinal tumor surgery back in 1986 by Dr. Baron [place].    DRUG ALLERGIES:   SHE NOTES AN ALLERGY TO PENICILLIN WHICH CAUSES A RASH.    SOCIAL HISTORY:    She does not smoke.  She drinks alcohol on a social basis.  Height and weight have been stable at 5', 1 " and 105 lbs.    REVIEW OF SYSTEMS:   Systems review is notable for back pain, leg pain and arthritis on a 14-point review sheet.    MEDICATIONS:    The patient is on no chronic medications.    PHYSICAL EXAMINATION:  She stands straight and erect without difficulty.  She is quite facile and moves about without any restriction.  Her motor strength appears good in the major groups including the iliopsoas, quadriceps, tibialis anterior and the gastrocs.  Deep tendon reflexes are 2+ in the patellae and 2+ in both Achilles.  Sensation is intact to vibration distally in the lower extremities.  The back does not reveal any abnormalities other than a midline scar over the thoracolumbar junction which is well healed.  There is no tenderness to palpation or percussion across the lumbar spine.    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  She has evidence of what appears to be a recurrent tumor in the right side of the canal at the thoracolumbar junction most likely consistent with a meningioma.  This lesion is now causing some early compromise and compression of her spinal cord.  Clinically, however, she is neurologically intact.  The  biggest singular symptom is of pain, particularly worse with sitting.  The patient notes that she sleeps and rests fine.  She can do all other activities fine.  However, the sitting pain in her low back is the singular worst problem.  I suggested that we can treat the back pain at the current time to see if we can symptomatically make it better.  However, I am concerned that she has evidence of early canal compromise and ultimately may need to have this lesion resected.  I have little doubt that it has likely grown since  her last followups as a lesion this big would certainly require followup.  If her symptoms should worsen despite efforts at conservative management, then ultimately she may need to consider surgery sooner than later.  However, as long as she is neurologically intact, we can follow along and if we can response to conservative management for spondylosis, we will continue to follow her at intervals and, at this point, I would not suggest a repeat study for at least a year's time if everything else remains equal.  We will see how she does with a program of some McKenzie exercises from physical therapy.  I have also suggested a trial of some ibuprofen as an anti-inflammatory to see if this lessens her symptoms.  I discussed with Ms. Sigler the symptoms to particularly be aware of and these include particularly bowel or bladder controlchanges,numbness or weakness, particularly in her right lower extremity.  We will follow along conservatively.    [facility]    Kaitlyn Flowers, M.D.  Past Medical History  Diagnosis Date  . Osteopenia   . 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  . Wears hearing aid   . PONV (postoperative nausea and vomiting)   . Anemia     Past Surgical History  Procedure Date  . Abdominal hysterectomy 1973    1/2 of ovary left cyst on ovary  . Shoulder rotator culff  2006  . Spinal cord tumor 1987    2012 october     . Appendectomy     removed with hysterectomy  . Rotator cuff repair     left     Family History  Problem Relation Age of Onset  . Stroke Mother     2010  . Heart attack Father   . Anesthesia problems Neg Hx    Social History:  reports that she quit smoking about 44 years ago. Her smoking use included Cigarettes. She has a .5 pack-year smoking history. She does not have any smokeless tobacco history on file. She reports that she drinks about 2.4 ounces of alcohol per week. She reports that she does not use illicit drugs.  Allergies:  Allergies  Allergen Reactions  . Penicillins Rash    Medications Prior to Admission  Medication Dose Route Frequency Provider Last Rate Last Dose  . bacitracin 45409 UNITS injection           . sodium chloride 0.9 % infusion           . vancomycin (VANCOCIN) IVPB 1000 mg/200 mL premix  1,000 mg Intravenous 120 min pre-op Barnett Abu, MD       Medications Prior to Admission  Medication Sig Dispense Refill  . calcium carbonate (CALCIUM 500) 1250 MG tablet Take 1 tablet by mouth daily.        . Multiple Vitamin (MULTIVITAMIN) tablet Take 1 tablet by mouth daily.          No results found for this or any previous visit (from the past 48 hour(s)). No results found.  Review of Systems  Constitutional: Negative.   HENT: Negative.   Eyes: Negative.   Respiratory: Negative.   Gastrointestinal: Negative.   Genitourinary: Negative.   Musculoskeletal:       Mass in mid thoracic spine underlying previous surgical site a lot of normal, consistent with pseudomeningocele  Neurological: Negative.   Endo/Heme/Allergies: Negative.   Psychiatric/Behavioral: Negative.     Blood pressure 123/74, pulse 71, temperature  98 F (36.7 C), temperature source Oral, resp. rate 18, SpO2 100.00%. Physical Exam  Constitutional: She appears well-developed and well-nourished.  HENT:  Head: Normocephalic and atraumatic.  Eyes: Conjunctivae and EOM are normal. Pupils  are equal, round, and reactive to light.  Neck: Normal range of motion. Neck supple.  Cardiovascular: Normal rate, regular rhythm and normal heart sounds.   Respiratory: Effort normal and breath sounds normal.  GI: Soft. Bowel sounds are normal.  Musculoskeletal: Normal range of motion.  Neurological: She is alert. She has normal reflexes.  Skin: Skin is warm and dry.  Psychiatric: She has a normal mood and affect. Her behavior is normal. Judgment and thought content normal.     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 Plan: Repair of pseudomeningocele  Amiliah Campisi J 12/06/2011, 12:39 PM

## 2011-12-06 NOTE — Op Note (Signed)
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 Anesthesia: Gen. endotracheal Indications: Patient is a 71 year old individual who many years ago had resection of a thoracic meningioma. Because this was a subtotal resection she underwent postoperative irradiation. The meningioma recurred and about 6 months ago this patient underwent a reresection of this lesion. She tolerated the surgery well however during the postoperative phase she has developed a suitable meningocele in the surgical site. There is a ballotable mass in the mid thoracic spine which continually bothers the patient during the daytime and is uncomfortable for her at night. After allowing the passage of time to see if this would resorb on its own the patient is now taken to the operating room to undergo surgical resection.  Procedure: Patient was brought to the operating room supine on a stretcher. After the smooth induction of general endotracheal anesthesia she was turned prone and the back was prepped with alcohol and DuraPrep. The bony prominences were appropriately padded and protected. An elliptical incision was created around the mass of the pseudomeningocele so as to excise the scar totally. The pseudomeningocele was easily entered just a few millimeters under the skin edge. The cavity was explored and it was noted that on the inferior aspect of the cavity there was a communication towards the dura. The fascial layer that remained closed was opened with a second elliptical incision to resect a thick whitish fascia which was devoid of any vascular tissue. The dura was then explored  carefully. With several Valsalva maneuvers to 40 cm of water no discrete leak could be identified. There was a very fine sheen of wetness that exceeded itself over the dural surface. Because no discrete leak could be identified it was decided to place an onlay graft of DuraGen and form fascial layers to close tightly over the dural graft. As was performed by creating 2 layers of release of the fascia in the paraspinous muscles. Each layer was sewed with interrupted 2-0 Vicryl to tightly close over the dura and over the onlay graft. Each successive layer was closed slightly tighter so as to create a bit of a pressure dressing over the thoracic spine. Finally then a third layer of the subcutaneous fascia was closed and a fourth layer of subcuticular skin was closed with 3-0 Vicryl. Blood loss for the procedure was nil cautery was not used during this procedure and the fascial surfaces were coarsened with curettage to create some bleeding surface. The wound was dry and clean after the surgery was complete and Dermabond was used as a dressing followed by a pressure dressing with gauze. The patient tolerated the procedure well was returned to recovery room in stable condition.

## 2011-12-06 NOTE — Transfer of Care (Signed)
Immediate Anesthesia Transfer of Care Note  Patient: Kaitlyn Flowers  Procedure(s) Performed: Procedure(s) (LRB): LUMBAR WOUND DEBRIDEMENT (N/A)  Patient Location: PACU  Anesthesia Type: General  Level of Consciousness: awake, alert , oriented and patient cooperative  Airway & Oxygen Therapy: Patient Spontanous Breathing and Patient connected to nasal cannula oxygen  Post-op Assessment: Report given to PACU RN, Post -op Vital signs reviewed and stable and Patient moving all extremities  Post vital signs: Reviewed and stable  Complications: No apparent anesthesia complications

## 2011-12-06 NOTE — Discharge Summary (Signed)
  Preoperative, admitting diagnosis: Pseudomeningocele thoracic spine Discharge and final diagnosis: Pseudomeningocele thoracic spine Condition: Good Hospital course: Patient was admitted for surgical repair of pseudomeningocele which occurred uneventfully. She has minimal pain is ambulatory and has voided she is discharged home. Prescriptions: Percocet #30 without refills.

## 2011-12-09 ENCOUNTER — Encounter (HOSPITAL_COMMUNITY): Payer: Self-pay | Admitting: Neurological Surgery

## 2011-12-20 ENCOUNTER — Other Ambulatory Visit (INDEPENDENT_AMBULATORY_CARE_PROVIDER_SITE_OTHER): Payer: Medicare Other

## 2011-12-20 DIAGNOSIS — Z20828 Contact with and (suspected) exposure to other viral communicable diseases: Secondary | ICD-10-CM

## 2011-12-20 DIAGNOSIS — D649 Anemia, unspecified: Secondary | ICD-10-CM

## 2011-12-20 LAB — CBC WITH DIFFERENTIAL/PLATELET
Basophils Absolute: 0 10*3/uL (ref 0.0–0.1)
Basophils Relative: 0.8 % (ref 0.0–3.0)
Eosinophils Absolute: 0.1 10*3/uL (ref 0.0–0.7)
Eosinophils Relative: 1.9 % (ref 0.0–5.0)
HCT: 31.6 % — ABNORMAL LOW (ref 36.0–46.0)
Hemoglobin: 10.6 g/dL — ABNORMAL LOW (ref 12.0–15.0)
Lymphocytes Relative: 32.4 % (ref 12.0–46.0)
Lymphs Abs: 1.8 10*3/uL (ref 0.7–4.0)
MCHC: 33.6 g/dL (ref 30.0–36.0)
MCV: 99.2 fl (ref 78.0–100.0)
Monocytes Absolute: 0.3 10*3/uL (ref 0.1–1.0)
Monocytes Relative: 5.5 % (ref 3.0–12.0)
Neutro Abs: 3.4 10*3/uL (ref 1.4–7.7)
Neutrophils Relative %: 59.4 % (ref 43.0–77.0)
Platelets: 262 10*3/uL (ref 150.0–400.0)
RBC: 3.19 Mil/uL — ABNORMAL LOW (ref 3.87–5.11)
RDW: 13.8 % (ref 11.5–14.6)
WBC: 5.7 10*3/uL (ref 4.5–10.5)

## 2011-12-24 ENCOUNTER — Ambulatory Visit (INDEPENDENT_AMBULATORY_CARE_PROVIDER_SITE_OTHER): Payer: Medicare Other | Admitting: Internal Medicine

## 2011-12-24 ENCOUNTER — Encounter: Payer: Self-pay | Admitting: Internal Medicine

## 2011-12-24 VITALS — BP 110/76 | HR 68 | Temp 97.6°F | Wt 106.0 lb

## 2011-12-24 DIAGNOSIS — G9782 Other postprocedural complications and disorders of nervous system: Secondary | ICD-10-CM

## 2011-12-24 DIAGNOSIS — G969 Disorder of central nervous system, unspecified: Secondary | ICD-10-CM

## 2011-12-24 DIAGNOSIS — B070 Plantar wart: Secondary | ICD-10-CM

## 2011-12-24 DIAGNOSIS — R5383 Other fatigue: Secondary | ICD-10-CM

## 2011-12-24 DIAGNOSIS — D649 Anemia, unspecified: Secondary | ICD-10-CM

## 2011-12-24 DIAGNOSIS — R5381 Other malaise: Secondary | ICD-10-CM

## 2011-12-24 LAB — IMMUNOFIXATION ELECTROPHORESIS
IgA: 253 mg/dL (ref 69–380)
IgG (Immunoglobin G), Serum: 549 mg/dL — ABNORMAL LOW (ref 690–1700)
IgM, Serum: 57 mg/dL (ref 52–322)
Total Protein, Serum Electrophoresis: 6 g/dL (ref 6.0–8.3)

## 2011-12-24 LAB — CYTOMEGALOVIRUS ANTIBODY, IGG: Cytomegalovirus Ab-IgG: 4.92 — ABNORMAL HIGH (ref <0.90)

## 2011-12-24 LAB — CMV IGM: CMV IgM: 0.58 (ref <0.90)

## 2011-12-24 NOTE — Patient Instructions (Signed)
Continue the iron once a day  Recheck labs inabout 4 months and ROV It may be that your normal hemoglobin is 11.5. At some point we could get hematology to consult with all this data and decide if anything else should be done.   For the plantarward or corn soak in warm soapy water twice a day  After soaking and use pumice to [place]  down the callus layer. Then used the salicylic acid topical overnight for 3-4 weeks.  If improving and continue until gone.  If not improving  contact us we could consider using a liquid nitrogen freeze and or have you see a podiatrist. Warts on the bottom of the foot usually resolve with time and sometimes the treatment is worse than the  original problem.  Plantar wart Plantar Wart Warts are benign (noncancerous) growths of the outer skin layer. They can occur at any time in life but are most common during childhood and the teen years. Warts can occur on many skin surfaces of the body. When they occur on the underside (sole) of your foot they are called plantar warts. They often emerge in groups with several small warts encircling a larger growth. CAUSES  Human papillomavirus (HPV) is the cause of plantar warts. HPV attacks a break in the skin of the foot. Walking barefoot can lead to exposure to the wart virus. Plantar warts tend to develop over areas of pressure such as the heel and ball of the foot. Plantar warts often grow into the deeper layers of skin. They may spread to other areas of the sole but cannot spread to other areas of the body. SYMPTOMS  You may also notice a growth on the undersurface of your foot. The wart may grow directly into the sole of the foot, or rise above the surface of the skin on the sole of the foot, or both. They are most often flat from pressure. Warts generally do not cause itching but may cause pain in the area of the wart when you put weight on your foot. DIAGNOSIS  Diagnosis is made by physical examination. This means your  caregiver discovers it while examining your foot.  TREATMENT  There are many ways to treat plantar warts. However, warts are very tough. Sometimes it is difficult to treat them so that they go away completely and do not grow back. Any treatment must be done regularly to work. If left untreated, most plantar warts will eventually disappear over a period of one to two years. Treatments you can do at home include:  Putting duct tape over the top of the wart (occlusion), has been found to be effective over several months. The duct tape should be removed each night and reapplied until the wart has disappeared.   Placing over-the-counter medications on top of the wart to help kill the wart virus and remove the wart tissue (salicylic acid, cantharidin, and dichloroacetic acid ) are useful. These are called keratolytic agents. These medications make the skin soft and gradually layers will shed away. Theses compounds are usually placed on the wart each night and then covered with a band-aid. They are also available in pre-medicated band-aid form. Avoid surrounding skin when applying these liquids as these medications can burn healthy skin. The treatment may take several months of nightly use to be effective.   Cryotherapy to freeze the wart has recently become available over-the-counter for children 4 years and older. This system makes use of a soft narrow applicator connected to a bottle  of compressed cold liquid that is applied directly to the wart. This medication can burn health skin and should be used with caution.   As with all over-the-counter medications, read the directions carefully before use.  Treatments generally done in your caregiver's office include:  Some aggressive treatments may cause discomfort, discoloration and scaring of the surrounding skin. The risks and benefits of treatment should be discussed with your caregiver.   Freezing the wart with liquid nitrogen (cryotherapy, see above).     Burning the wart with use of very high heat (cautery).   Injecting medication into the wart.   Surgically removing or laser treatment of the wart.   Your caregiver may refer you to a dermatologist for difficult to treat, large sized or large numbers of warts.  HOME CARE INSTRUCTIONS   Soak the affected area in warm water. Dry the area completely when you are done. Remove the top layer of softened skin, then apply the chosen topical medication and reapply a bandage.   Remove the bandage daily and file excess wart tissue (pumice stone works well for this purpose). Repeat the entire process daily or every other day for weeks until the plantar wart disappears.   Several brands of salicylic acid pads are available as over-the-counter remedies.   Pain can be relieved by wearing a doughnut bandage. This is a bandage with a hole in it. The bandage is put on with the hole over the wart. This helps take the pressure off the wart and gives pain relief.  To help prevent plantar warts:  Wear shoes and socks and change them daily.   Keep feet clean and dry.   Check your feet and your children's feet regularly.   Avoid direct contact with warts on other people.   Have growths, or changes on your skin checked by your caregiver.  Document Released: 11/09/2003 Document Revised: 08/08/2011 Document Reviewed: 04/19/2009 [facility] Patient Information 2012 [place], [place].

## 2011-12-27 ENCOUNTER — Ambulatory Visit: Payer: Medicare Other | Admitting: Internal Medicine

## 2011-12-29 DIAGNOSIS — B070 Plantar wart: Secondary | ICD-10-CM | POA: Insufficient documentation

## 2011-12-29 NOTE — Progress Notes (Signed)
  Subjective:    Patient ID: Kaitlyn Flowers, female    DOB: [birth year], 71 y.o.   MRN: [id number]  HPI Pt comes in for Fu of fatigue and lab results. Since her last visit she has had a dural repair of her CSF leak and  Feels "so much better"  As if the repair has resolved her  Fatigue. No syncope cp sob fever with loss or unusual bleeding .   Has 3 weeks or more of ? Plantar wart on right and painful Asks advice for rx .    Review of Systems No fever weight loss bleeding  Injury . Back to exercise some.  No change in hearing and vision joint issues  Rest as per hpi.  Past history family history social history reviewed in the electronic medical record.     Objective:   Physical Exam BP 110/76  Pulse 68  Temp(Src) 97.6 F (36.4 C) (Oral)  Wt 106 lb (48.081 kg) WDWN in nad Looks well   Right foot plantar surface a calloused thickened heaped up 3-4 mm lesion ? With center no redness or FB lesion Reviewed labs and readings CMV igM somwhat elevated serology pos for past EBV  Still anemia  Lab Results  Component Value Date   WBC 5.7 12/20/2011   HGB 10.6* 12/20/2011   HCT 31.6* 12/20/2011   MCV 99.2 12/20/2011   PLT 262.0 12/20/2011       Assessment & Plan:   Fatigue resolved after surgery. Intriguing  Poss recent cmv infection  Fu lab pending. Either way will follow . No new sx except her wart.   Anemia persists  May need further input.  Seems to run at 11.5 but still down. Feels fine. For now continue iron and then fu readings .  Plantar wart  vs corn  Disc rx and    Risk benefit of medication discussed. To do soaking and  Paring and otc rx for now and fu if  persistent or progressive   S/P  Pseudo meningocele repair doing well.

## 2012-05-29 ENCOUNTER — Telehealth: Payer: Self-pay | Admitting: Internal Medicine

## 2012-05-29 ENCOUNTER — Encounter: Payer: Self-pay | Admitting: Family

## 2012-05-29 ENCOUNTER — Ambulatory Visit (INDEPENDENT_AMBULATORY_CARE_PROVIDER_SITE_OTHER): Payer: Medicare Other | Admitting: Family

## 2012-05-29 VITALS — BP 120/72 | HR 66 | Temp 97.7°F | Wt 106.0 lb

## 2012-05-29 DIAGNOSIS — R350 Frequency of micturition: Secondary | ICD-10-CM

## 2012-05-29 DIAGNOSIS — Z23 Encounter for immunization: Secondary | ICD-10-CM

## 2012-05-29 DIAGNOSIS — N390 Urinary tract infection, site not specified: Secondary | ICD-10-CM

## 2012-05-29 DIAGNOSIS — R300 Dysuria: Secondary | ICD-10-CM

## 2012-05-29 MED ORDER — SULFAMETHOXAZOLE-TRIMETHOPRIM 800-160 MG PO TABS: 1.0000 | ORAL_TABLET | Freq: Two times a day (BID) | ORAL | Status: DC

## 2012-05-29 NOTE — Telephone Encounter (Signed)
Rx sent 

## 2012-05-29 NOTE — Progress Notes (Signed)
Subjective:    Patient ID: Kaitlyn Flowers, female    DOB: 1941-07-15, 71 y.o.   MRN: [id number]  HPI 71 year old WF is in today with c/o urinary frequency, urgency, burning with urination x 3 days. She has not taken any medication for relief. Has a history of UTIs in the past.     Review of Systems  Constitutional: Negative.   Respiratory: Negative.   Cardiovascular: Negative.   Gastrointestinal: Negative.  Negative for abdominal pain.  Genitourinary: Positive for dysuria, frequency and hematuria.  Skin: Negative.   Psychiatric/Behavioral: Negative.    Past Medical History  Diagnosis Date  . Osteopenia   . 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  . Wears hearing aid   . PONV (postoperative nausea and vomiting)   . Anemia     History   Social History  . Marital Status: Legally Separated    Spouse Name: N/A    Number of Children: N/A  . Years of Education: N/A   Occupational History  . Not on file.   Social History Main Topics  . Smoking status: Former Smoker -- 0.2 packs/day for 2 years    Types: Cigarettes    Quit date: 09/03/1967  . Smokeless tobacco: Not on file  . Alcohol Use: 2.4 oz/week    4 Glasses of wine per week  . Drug Use: No  . Sexually Active: Not on file   Other Topics Concern  . 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 Abdominal hysterectomy 1973    1/2 of ovary left cyst on ovary  . Shoulder rotator culff  2006  . Spinal cord tumor 1987    2012 october   . Appendectomy     removed with hysterectomy  . Rotator cuff repair     left   . Lumbar wound debridement 12/06/2011    Procedure: LUMBAR WOUND DEBRIDEMENT;  Surgeon: Barnett Abu, MD;  Location: [facility];  Service: Neurosurgery;  Laterality: N/A;  Repair of Pseudomeningocele Lumbar wound    Family  History  Problem Relation Age of Onset  . Stroke Mother     2010  . Heart attack Father   . Anesthesia problems Neg Hx     Allergies  Allergen Reactions  . Penicillins Rash    Current Outpatient Prescriptions on File Prior to Visit  Medication Sig Dispense Refill  . calcium carbonate (CALCIUM 500) 1250 MG tablet Take 1 tablet by mouth daily.        Blossom Hoops Iron (PERFECT IRON) 25 MG TABS Take 25 mg by mouth daily.      . Multiple Vitamin (MULTIVITAMIN) tablet Take 1 tablet by mouth daily.        Marland Kitchen OVER THE COUNTER MEDICATION Take 1 tablet by mouth daily. Vitamin A & E        BP 120/72  Pulse 66  Temp 97.7 F (36.5 C) (Oral)  Wt 106 lb (48.081 kg)  SpO2 99%chart    Objective:   Physical Exam  Constitutional: She is oriented to person, place, and time. She appears well-developed and well-nourished.  Neck: Normal range of motion. Neck supple.  Cardiovascular: Normal rate, regular rhythm and normal heart sounds.   Pulmonary/Chest: Effort normal and breath sounds normal.  Abdominal: Soft. Bowel sounds are  normal. She exhibits no distension. There is no tenderness.  Neurological: She is alert and oriented to person, place, and time.  Skin: Skin is warm and dry.  Psychiatric: She has a normal mood and affect.          Assessment & Plan:  Assessment: UTI, Urinary Frequency, Urinary Urgency  Plan: Bactrim DS twice daily x 7 days. Increase intake of water. Drink cranberry juice.

## 2012-05-29 NOTE — Telephone Encounter (Signed)
Pt called and said that Kaitlyn Flowers said that she was going to send in a med for UTI and pt has checked with her pharmacy and nothing has been sent in yet. Pls call in to [facility] on [address].

## 2012-06-12 ENCOUNTER — Ambulatory Visit (INDEPENDENT_AMBULATORY_CARE_PROVIDER_SITE_OTHER): Payer: Medicare Other | Admitting: Internal Medicine

## 2012-06-12 ENCOUNTER — Encounter: Payer: Self-pay | Admitting: Internal Medicine

## 2012-06-12 VITALS — BP 116/62 | HR 76 | Temp 97.8°F | Wt 105.0 lb

## 2012-06-12 DIAGNOSIS — Z8669 Personal history of other diseases of the nervous system and sense organs: Secondary | ICD-10-CM

## 2012-06-12 DIAGNOSIS — Z86018 Personal history of other benign neoplasm: Secondary | ICD-10-CM

## 2012-06-12 DIAGNOSIS — M549 Dorsalgia, unspecified: Secondary | ICD-10-CM

## 2012-06-12 DIAGNOSIS — Z23 Encounter for immunization: Secondary | ICD-10-CM

## 2012-06-12 NOTE — Progress Notes (Signed)
  Subjective:    Patient ID: Kaitlyn Flowers, female    DOB: Nov 10, 1940, 71 y.o.   MRN: [id number]  HPI Patient comes in today for SDA for  new problem evaluation. In her ususal statae of health with reg exercise walking and swimming and hiking had had the insidious onset of R LB pain discomfort that waxes and wanes and at times is like a "hot poker"  Has had 3 episodes of numbness down right leg mostly front that can last up to 6-8 hours to days. No assoicated weakness  Falling fever chills.  No injury  Swimming some like breast stroke hurts some bu ok with walking.  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 Has fu with Dr Danielle Dess soon.  Would like to have shingles vaccine Review of Systems Neg cp sob fever falling weakness  New rashes  No uti sx hematuria  Chills abd pain.   Past history family history social history reviewed in the electronic medical record. Outpatient Encounter Prescriptions as of 06/12/2012  Medication Sig Dispense Refill  . Carbonyl Iron (PERFECT IRON) 25 MG TABS Take 25 mg by mouth daily.      . Multiple Vitamin (MULTIVITAMIN) tablet Take 1 tablet by mouth daily.        Marland Kitchen DISCONTD: calcium carbonate (CALCIUM 500) 1250 MG tablet Take 1 tablet by mouth daily.        Marland Kitchen DISCONTD: OVER THE COUNTER MEDICATION Take 1 tablet by mouth daily. Vitamin A & E      . DISCONTD: sulfamethoxazole-trimethoprim (BACTRIM DS,SEPTRA DS) 800-160 MG per tablet Take 1 tablet by mouth 2 (two) times daily.  14 tablet  0      Objective:   Physical Exam BP 116/62  Pulse 76  Temp 97.8 F (36.6 C) (Oral)  Wt 105 lb (47.628 kg)  SpO2 93% WDWN in nad  Look well. Gait seems normal Chest cta cor rr abd no masses  g or reboundd  Back well healed thorax  mid scar without swelling. LB right area of discomfort  No guarding and no rash.  Strength le seems normal  Toe heel walk some discomfort on dorsiflexion.  DTRs hard to elicit no  clonus.     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 Suspect not related phenom but has radicular findings. Avoid aggravating  activities  Would have Dr Danielle Dess opine on other interventions and  Imaging if needed.  At her follow up visit in the next few weeks.  No other alarm features today. Declined  Oral steroid course today.  Disc zostavax  Given today. Total visit > 50% spent counseling and coordinating care  Copy to Dr Danielle Dess

## 2012-06-12 NOTE — Patient Instructions (Signed)
Your pain appears to be like a pinched nerve in  Back. Probably not related to your previous back surgery problem but could be related to arthritis in the back. Continue exercise that does not aggravate it. Symptomatic treatment would have Dr. Danielle Dess address this when he going for followup in a few weeks. We'll send him a copy of the note.  Contact for more urgent care if you get weakness increasing symptoms in that time.

## 2012-07-09 ENCOUNTER — Other Ambulatory Visit: Payer: Self-pay | Admitting: Neurological Surgery

## 2012-07-09 DIAGNOSIS — D329 Benign neoplasm of meninges, unspecified: Secondary | ICD-10-CM

## 2012-07-09 DIAGNOSIS — M542 Cervicalgia: Secondary | ICD-10-CM

## 2012-07-09 DIAGNOSIS — M549 Dorsalgia, unspecified: Secondary | ICD-10-CM

## 2012-07-21 ENCOUNTER — Ambulatory Visit
Admission: RE | Admit: 2012-07-21 | Discharge: 2012-07-21 | Disposition: A | Discharge status: Home / Self Care | Payer: Medicare Other | Source: Ambulatory Visit | Attending: Neurological Surgery | Admitting: Neurological Surgery

## 2012-07-21 DIAGNOSIS — M549 Dorsalgia, unspecified: Secondary | ICD-10-CM

## 2012-07-21 DIAGNOSIS — D329 Benign neoplasm of meninges, unspecified: Secondary | ICD-10-CM

## 2012-07-21 DIAGNOSIS — M542 Cervicalgia: Secondary | ICD-10-CM

## 2012-07-21 MED ORDER — GADOBENATE DIMEGLUMINE 529 MG/ML IV SOLN
9.0000 mL | Freq: Once | INTRAVENOUS | Status: AC | PRN
  Administered 2012-07-21: 9 mL via INTRAVENOUS

## 2012-08-22 ENCOUNTER — Emergency Department (HOSPITAL_COMMUNITY): Payer: Medicare Other

## 2012-08-22 ENCOUNTER — Inpatient Hospital Stay (HOSPITAL_COMMUNITY)
Admission: EM | Admit: 2012-08-22 | Discharge: 2012-08-26 | DRG: 390 | Disposition: A | Discharge status: Home / Self Care | Payer: Medicare Other | Attending: Surgery | Admitting: Surgery

## 2012-08-22 ENCOUNTER — Encounter (HOSPITAL_COMMUNITY): Payer: Self-pay | Admitting: *Deleted

## 2012-08-22 DIAGNOSIS — K56609 Unspecified intestinal obstruction, unspecified as to partial versus complete obstruction: Principal | ICD-10-CM | POA: Diagnosis present

## 2012-08-22 DIAGNOSIS — Z87891 Personal history of nicotine dependence: Secondary | ICD-10-CM

## 2012-08-22 DIAGNOSIS — D649 Anemia, unspecified: Secondary | ICD-10-CM | POA: Diagnosis present

## 2012-08-22 DIAGNOSIS — R109 Unspecified abdominal pain: Secondary | ICD-10-CM

## 2012-08-22 DIAGNOSIS — M949 Disorder of cartilage, unspecified: Secondary | ICD-10-CM | POA: Diagnosis present

## 2012-08-22 DIAGNOSIS — M899 Disorder of bone, unspecified: Secondary | ICD-10-CM | POA: Diagnosis present

## 2012-08-22 DIAGNOSIS — K523 Indeterminate colitis: Secondary | ICD-10-CM

## 2012-08-22 LAB — CBC
HCT: 34.8 % — ABNORMAL LOW (ref 36.0–46.0)
Hemoglobin: 12 g/dL (ref 12.0–15.0)
MCH: 33.3 pg (ref 26.0–34.0)
MCHC: 34.5 g/dL (ref 30.0–36.0)
MCV: 96.7 fL (ref 78.0–100.0)
Platelets: 220 10*3/uL (ref 150–400)
RBC: 3.6 MIL/uL — ABNORMAL LOW (ref 3.87–5.11)
RDW: 13.3 % (ref 11.5–15.5)
WBC: 9.4 10*3/uL (ref 4.0–10.5)

## 2012-08-22 LAB — PROTIME-INR
INR: 0.92 (ref 0.00–1.49)
Prothrombin Time: 12.3 seconds (ref 11.6–15.2)

## 2012-08-22 LAB — BASIC METABOLIC PANEL
BUN: 17 mg/dL (ref 6–23)
CO2: 25 mEq/L (ref 19–32)
Calcium: 10 mg/dL (ref 8.4–10.5)
Chloride: 98 mEq/L (ref 96–112)
Creatinine, Ser: 0.76 mg/dL (ref 0.50–1.10)
GFR calc Af Amer: >90 mL/min (ref >90)
GFR calc non Af Amer: 83 mL/min — ABNORMAL LOW (ref >90)
Glucose, Bld: 115 mg/dL — ABNORMAL HIGH (ref 70–99)
Potassium: 3.6 mEq/L (ref 3.5–5.1)
Sodium: 138 mEq/L (ref 135–145)

## 2012-08-22 LAB — POCT I-STAT TROPONIN I: Troponin i, poc: 0 ng/mL (ref 0.00–0.08)

## 2012-08-22 MED ORDER — MORPHINE SULFATE 4 MG/ML IJ SOLN
4.0000 mg | Freq: Once | INTRAMUSCULAR | Status: AC
  Administered 2012-08-23: 4 mg via INTRAVENOUS
  Filled 2012-08-22: qty 1

## 2012-08-22 MED ORDER — ONDANSETRON HCL 4 MG/2ML IJ SOLN
4.0000 mg | Freq: Once | INTRAMUSCULAR | Status: AC
  Administered 2012-08-23: 4 mg via INTRAVENOUS

## 2012-08-22 NOTE — ED Notes (Signed)
Old and new EKG given to Dr Davidson  

## 2012-08-23 ENCOUNTER — Encounter (HOSPITAL_COMMUNITY): Payer: Self-pay | Admitting: General Surgery

## 2012-08-23 ENCOUNTER — Emergency Department (HOSPITAL_COMMUNITY): Payer: Medicare Other

## 2012-08-23 DIAGNOSIS — K56609 Unspecified intestinal obstruction, unspecified as to partial versus complete obstruction: Secondary | ICD-10-CM

## 2012-08-23 LAB — HEPATIC FUNCTION PANEL
ALT: 14 U/L (ref 0–35)
AST: 22 U/L (ref 0–37)
Albumin: 4.2 g/dL (ref 3.5–5.2)
Alkaline Phosphatase: 59 U/L (ref 39–117)
Bilirubin, Direct: <0.1 mg/dL (ref 0.0–0.3)
Total Bilirubin: 0.3 mg/dL (ref 0.3–1.2)
Total Protein: 6.8 g/dL (ref 6.0–8.3)

## 2012-08-23 LAB — LACTIC ACID, PLASMA: Lactic Acid, Venous: 2.1 mmol/L (ref 0.5–2.2)

## 2012-08-23 LAB — LIPASE, BLOOD: Lipase: 95 U/L — ABNORMAL HIGH (ref 11–59)

## 2012-08-23 MED ORDER — MORPHINE SULFATE 2 MG/ML IJ SOLN
2.0000 mg | INTRAMUSCULAR | Status: DC | PRN
  Administered 2012-08-23: 2 mg via INTRAVENOUS
  Filled 2012-08-23 (×2): qty 1

## 2012-08-23 MED ORDER — MORPHINE SULFATE 4 MG/ML IJ SOLN
4.0000 mg | Freq: Once | INTRAMUSCULAR | Status: AC
  Administered 2012-08-23: 4 mg via INTRAVENOUS
  Filled 2012-08-23: qty 1

## 2012-08-23 MED ORDER — PANTOPRAZOLE SODIUM 40 MG IV SOLR
40.0000 mg | Freq: Every day | INTRAVENOUS | Status: DC
  Administered 2012-08-23 – 2012-08-25 (×3): 40 mg via INTRAVENOUS
  Filled 2012-08-23 (×4): qty 40

## 2012-08-23 MED ORDER — IOHEXOL 300 MG/ML  SOLN: 20.0000 mL | INTRAMUSCULAR | Status: DC

## 2012-08-23 MED ORDER — ACETAMINOPHEN 650 MG RE SUPP
650.0000 mg | Freq: Four times a day (QID) | RECTAL | Status: DC | PRN
  Administered 2012-08-23 – 2012-08-25 (×5): 650 mg via RECTAL
  Filled 2012-08-23 (×5): qty 1

## 2012-08-23 MED ORDER — CHLORHEXIDINE GLUCONATE 0.12 % MT SOLN
15.0000 mL | Freq: Two times a day (BID) | OROMUCOSAL | Status: DC
  Administered 2012-08-23 – 2012-08-26 (×6): 15 mL via OROMUCOSAL
  Filled 2012-08-23 (×5): qty 15

## 2012-08-23 MED ORDER — HYDROMORPHONE HCL PF 1 MG/ML IJ SOLN
0.5000 mg | INTRAMUSCULAR | Status: DC | PRN
  Administered 2012-08-23 – 2012-08-24 (×2): 0.5 mg via INTRAVENOUS
  Filled 2012-08-23 (×2): qty 1

## 2012-08-23 MED ORDER — BIOTENE DRY MOUTH MT LIQD
15.0000 mL | Freq: Two times a day (BID) | OROMUCOSAL | Status: DC
  Administered 2012-08-23 – 2012-08-25 (×4): 15 mL via OROMUCOSAL

## 2012-08-23 MED ORDER — ONDANSETRON HCL 4 MG/2ML IJ SOLN
4.0000 mg | Freq: Four times a day (QID) | INTRAMUSCULAR | Status: DC | PRN
  Administered 2012-08-23 – 2012-08-24 (×3): 4 mg via INTRAVENOUS
  Filled 2012-08-23 (×2): qty 2

## 2012-08-23 MED ORDER — KCL IN DEXTROSE-NACL 20-5-0.9 MEQ/L-%-% IV SOLN
INTRAVENOUS | Status: DC
  Administered 2012-08-23 – 2012-08-25 (×6): via INTRAVENOUS
  Administered 2012-08-25: 50 mL/h via INTRAVENOUS
  Filled 2012-08-23 (×11): qty 1000

## 2012-08-23 MED ORDER — PROMETHAZINE HCL 25 MG/ML IJ SOLN
12.5000 mg | Freq: Four times a day (QID) | INTRAMUSCULAR | Status: DC | PRN
  Administered 2012-08-23: 12.5 mg via INTRAVENOUS
  Filled 2012-08-23 (×2): qty 1

## 2012-08-23 MED ORDER — IOHEXOL 300 MG/ML  SOLN
80.0000 mL | Freq: Once | INTRAMUSCULAR | Status: AC | PRN
  Administered 2012-08-23: 80 mL via INTRAVENOUS

## 2012-08-23 MED ORDER — GABAPENTIN 300 MG PO CAPS
300.0000 mg | ORAL_CAPSULE | Freq: Every evening | ORAL | Status: DC | PRN
  Filled 2012-08-23: qty 1

## 2012-08-23 MED ORDER — ONDANSETRON HCL 4 MG/2ML IJ SOLN
4.0000 mg | Freq: Once | INTRAMUSCULAR | Status: AC
  Administered 2012-08-23: 4 mg via INTRAVENOUS
  Filled 2012-08-23: qty 2

## 2012-08-23 NOTE — ED Notes (Addendum)
Pt stable. No signs of distress noted. Pt sleeping waiting on consult

## 2012-08-23 NOTE — ED Notes (Signed)
Report given to nurse on 6 N 21

## 2012-08-23 NOTE — ED Notes (Signed)
Pt back from scan 

## 2012-08-23 NOTE — ED Notes (Signed)
Report given by Kristi, RN 

## 2012-08-23 NOTE — ED Notes (Signed)
Patient transported to X-ray 

## 2012-08-23 NOTE — ED Provider Notes (Addendum)
History     CSN: [id number]  Arrival date & time 08/22/12  2221   First MD Initiated Contact with Patient 08/22/12 2344      Chief Complaint  Patient presents with  . Chest Pain  . Emesis  . Abdominal Pain    (Consider location/radiation/quality/duration/timing/severity/associated sxs/prior treatment) HPI Comments: [name] presents ambulatory for evaluation of abdomina pain.  She last ate around 1900.  At 2030 she started experiencing a constant severe upper abdominal pain.  She reports feeling nauseated and vomiting several times.  The pain has never abated.  She denies any fever, diarrhea, constipation, flank pain, back pain, melena, hematochezia, and dysuria.  She describes the emesis as non-bloody and non-bilious.  Patient is a 71 y.o. female presenting with chest pain, vomiting, and abdominal pain. The history is provided by the patient. No language interpreter was used.  Chest Pain The chest pain began 3 - 5 hours ago (2030). Chest pain occurs constantly. The chest pain is worsening. At its most intense, the pain is at 10/10. The pain is currently at 10/10. The severity of the pain is severe. The quality of the pain is described as sharp. The pain does not radiate. Primary symptoms include abdominal pain, nausea and vomiting. Pertinent negatives for primary symptoms include no fever, no fatigue, no syncope, no cough, no palpitations, no dizziness and no altered mental status. She tried nothing for the symptoms. Risk factors include being elderly.    Emesis  Associated symptoms include abdominal pain. Pertinent negatives include no cough and no fever.  Abdominal Pain The primary symptoms of the illness include abdominal pain, nausea and vomiting. The primary symptoms of the illness do not include fever or fatigue.    Past Medical History  Diagnosis Date  . Osteopenia   . 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  . Wears hearing aid   . PONV  (postoperative nausea and vomiting)   . Anemia     Past Surgical History  Procedure Date  . Abdominal hysterectomy 1973    1/2 of ovary left cyst on ovary  . Shoulder rotator culff  2006  . Spinal cord tumor 1987    2012 october   . Appendectomy     removed with hysterectomy  . Rotator cuff repair     left   . Lumbar wound debridement 12/06/2011    Procedure: LUMBAR WOUND DEBRIDEMENT;  Surgeon: Barnett Abu, MD;  Location: [facility];  Service: Neurosurgery;  Laterality: N/A;  Repair of Pseudomeningocele Lumbar wound    Family History  Problem Relation Age of Onset  . Stroke Mother     2010  . Heart attack Father   . Anesthesia problems Neg Hx     History  Substance Use Topics  . Smoking status: Former Smoker -- 0.2 packs/day for 2 years    Types: Cigarettes    Quit date: 09/03/1967  . Smokeless tobacco: Not on file  . Alcohol Use: 2.4 oz/week    4 Glasses of wine per week    OB History    Grav Para Term Preterm Abortions TAB SAB Ect Mult Living                  Review of Systems  Constitutional: Negative for fever and fatigue.  Respiratory: Negative for cough.   Cardiovascular: Positive for chest pain. Negative for palpitations and syncope.  Gastrointestinal: Positive for nausea, vomiting and abdominal pain.  Neurological:  Negative for dizziness.  Psychiatric/Behavioral: Negative for altered mental status.  All other systems reviewed and are negative.    Allergies  Penicillins  Home Medications   Current Outpatient Rx  Name  Route  Sig  Dispense  Refill  . CARBONYL IRON 25 MG PO TABS   Oral   Take 25 mg by mouth daily.         Marland Kitchen ONE-DAILY MULTI VITAMINS PO TABS   Oral   Take 1 tablet by mouth daily.             BP 128/73  Pulse 69  Resp 22  SpO2 100%  Physical Exam  Nursing note and vitals reviewed. Constitutional: She is oriented to person, place, and time. She appears well-developed and well-nourished. She is active and cooperative.   Non-toxic appearance. She does not have a sickly appearance. She appears ill. No distress.  HENT:  Head: Normocephalic and atraumatic. Head is without raccoon's eyes, without Battle's sign, without abrasion, without right periorbital erythema and without left periorbital erythema. Hair is normal. No trismus in the jaw.  Right Ear: External ear normal.  Left Ear: External ear normal.  Nose: Nose normal. No rhinorrhea.  Mouth/Throat: Uvula is midline and oropharynx is clear and moist. Mucous membranes are not pale, not dry and not cyanotic. No uvula swelling. No oropharyngeal exudate, posterior oropharyngeal edema, posterior oropharyngeal erythema or tonsillar abscesses.  Eyes: Conjunctivae normal are normal. Pupils are equal, round, and reactive to light. Right eye exhibits no discharge. Left eye exhibits no discharge. No scleral icterus.  Neck: Normal range of motion. Neck supple. No JVD present. No tracheal deviation present.  Cardiovascular: Normal rate, regular rhythm, normal heart sounds and intact distal pulses.  Exam reveals no gallop and no friction rub.   No murmur heard. Pulmonary/Chest: Effort normal and breath sounds normal. No stridor. No respiratory distress. She has no wheezes. She has no rales. She exhibits no tenderness.  Abdominal: Soft. Normal appearance. She exhibits no shifting dullness, no distension, no pulsatile liver, no fluid wave, no abdominal bruit, no ascites, no pulsatile midline mass and no mass. Bowel sounds are decreased. There is no hepatosplenomegaly. There is tenderness in the right upper quadrant, epigastric area and left upper quadrant. There is guarding. There is no rigidity, no CVA tenderness, no tenderness at McBurney's point and negative Murphy's sign. No hernia.  Musculoskeletal: Normal range of motion. She exhibits no edema and no tenderness.  Lymphadenopathy:    She has no cervical adenopathy.  Neurological: She is alert and oriented to person, place, and  time. No cranial nerve deficit.  Skin: Skin is warm and dry. No rash noted. She is not diaphoretic. No erythema. No pallor.  Psychiatric: Her behavior is normal.    ED Course  Procedures (including critical care time)  Labs Reviewed  CBC - Abnormal; Notable for the following:    RBC 3.60 (*)     HCT 34.8 (*)     All other components within normal limits  BASIC METABOLIC PANEL - Abnormal; Notable for the following:    Glucose, Bld 115 (*)     GFR calc non Af Amer 83 (*)     All other components within normal limits  PROTIME-INR  POCT I-STAT TROPONIN I  HEPATIC FUNCTION PANEL  LACTIC ACID, PLASMA  LIPASE, BLOOD   No results found.   No diagnosis found.   Date: 08/23/2012  Rate: 72 bpm  Rhythm: normal sinus rhythm  QRS Axis: normal  Intervals: normal  ST/T Wave abnormalities: normal  Conduction Disutrbances:none  Narrative Interpretation:   Old EKG Reviewed: unchanged      MDM  Pt presents for evaluation of severe abdominal pain.  She appears uncomfortable, note stable vital signs.  She has significant upper abdominal tenderness and guarding.  Will obtain belly labs, lactic acid, CXR, and KUB.  Will administer morphine and zofran for pain and nausea.  Anticipate obtaining further imaging of the abdomen.  Performed a bedside ultrasound.  No free fluid obsereved in Morrison's pouch, the splenorenal space, or in pelvis.  No obvious aortic dilatation noted.  0120.  S/p morphine and reglan, pain is improved.  Note essentially normal labs.  She has received and is drinking po contrast and will receive a CT of the abdomen and pelvis.  1308.  Pt stable, NAD.  She has required a second dose of pain medication.  CT scan is positive for small bowel inflammatory changes with dilatation of the bowel proximal to the inflammation consistent with an obstruction.  Discussed her exam and findings with the on-call surgeon.  He will perform a bedside evaluation.      Tobin Chad,  MD 08/23/12 [id number]  Tobin Chad, MD 08/23/12 4696  Tobin Chad, MD 08/23/12 2952  Tobin Chad, MD 08/23/12 [phone]

## 2012-08-23 NOTE — H&P (Signed)
Kaitlyn Flowers is an 71 y.o. female.   Chief Complaint: abd pain HPI: The pt is a 71 yo wf who was feeling well until last night when she developed crampy severe lower abd pain. She denies any fever. She has passed flatus during the night and has been having normal bowel movements. She came to er. CT was done that suggested a small area of thickening of small bowel wall in pelvis with some mild proximal dilation. She has had nausea and vomiting during the night as well  Past Medical History  Diagnosis Date  . Osteopenia   . 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  . Wears hearing aid   . PONV (postoperative nausea and vomiting)   . Anemia     Past Surgical History  Procedure Date  . Abdominal hysterectomy 1973    1/2 of ovary left cyst on ovary  . Shoulder rotator culff  2006  . Spinal cord tumor 1987    2012 october   . Appendectomy     removed with hysterectomy  . Rotator cuff repair     left   . Lumbar wound debridement 12/06/2011    Procedure: LUMBAR WOUND DEBRIDEMENT;  Surgeon: Barnett Abu, MD;  Location: [facility];  Service: Neurosurgery;  Laterality: N/A;  Repair of Pseudomeningocele Lumbar wound    Family History  Problem Relation Age of Onset  . Stroke Mother     2010  . Heart attack Father   . Anesthesia problems Neg Hx    Social History:  reports that she quit smoking about 45 years ago. Her smoking use included Cigarettes. She has a .5 pack-year smoking history. She does not have any smokeless tobacco history on file. She reports that she drinks about 2.4 ounces of alcohol per week. She reports that she does not use illicit drugs.  Allergies:  Allergies  Allergen Reactions  . Penicillins Rash     (Not in a hospital admission)  Results for orders placed during the hospital encounter of 08/22/12 (from the past 48 hour(s))  CBC     Status: Abnormal   Collection Time   08/22/12 10:59 PM      Component Value Range Comment   WBC  9.4  4.0 - 10.5 K/uL    RBC 3.60 (*) 3.87 - 5.11 MIL/uL    Hemoglobin 12.0  12.0 - 15.0 g/dL    HCT 16.1 (*) 09.6 - 46.0 %    MCV 96.7  78.0 - 100.0 fL    MCH 33.3  26.0 - 34.0 pg    MCHC 34.5  30.0 - 36.0 g/dL    RDW 04.5  40.9 - 81.1 %    Platelets 220  150 - 400 K/uL   BASIC METABOLIC PANEL     Status: Abnormal   Collection Time   08/22/12 10:59 PM      Component Value Range Comment   Sodium 138  135 - 145 mEq/L    Potassium 3.6  3.5 - 5.1 mEq/L    Chloride 98  96 - 112 mEq/L    CO2 25  19 - 32 mEq/L    Glucose, Bld 115 (*) 70 - 99 mg/dL    BUN 17  6 - 23 mg/dL    Creatinine, Ser 9.14  0.50 - 1.10 mg/dL    Calcium 78.2  8.4 - 10.5 mg/dL    GFR calc non Af Amer 83 (*) >90 mL/min  GFR calc Af Amer >90  >90 mL/min   PROTIME-INR     Status: Normal   Collection Time   08/22/12 10:59 PM      Component Value Range Comment   Prothrombin Time 12.3  11.6 - 15.2 seconds    INR 0.92  0.00 - 1.49   POCT I-STAT TROPONIN I     Status: Normal   Collection Time   08/22/12 11:13 PM      Component Value Range Comment   Troponin i, poc 0.00  0.00 - 0.08 ng/mL    Comment 3            HEPATIC FUNCTION PANEL     Status: Normal   Collection Time   08/22/12 11:50 PM      Component Value Range Comment   Total Protein 6.8  6.0 - 8.3 g/dL    Albumin 4.2  3.5 - 5.2 g/dL    AST 22  0 - 37 U/L    ALT 14  0 - 35 U/L    Alkaline Phosphatase 59  39 - 117 U/L    Total Bilirubin 0.3  0.3 - 1.2 mg/dL    Bilirubin, Direct <1.6  0.0 - 0.3 mg/dL    Indirect Bilirubin NOT CALCULATED  0.3 - 0.9 mg/dL   LACTIC ACID, PLASMA     Status: Normal   Collection Time   08/22/12 11:50 PM      Component Value Range Comment   Lactic Acid, Venous 2.1  0.5 - 2.2 mmol/L   LIPASE, BLOOD     Status: Abnormal   Collection Time   08/22/12 11:50 PM      Component Value Range Comment   Lipase 95 (*) 11 - 59 U/L    Dg Chest 1 View  08/23/2012  *RADIOLOGY REPORT*  Clinical Data: Chest pain.  CHEST - 1 VIEW   Comparison: None.  Findings: Single view of the chest was obtained.  There is a round calcified granuloma in the lateral left lung.  Heart size is within normal limits.  The lungs are clear without focal airspace disease.  IMPRESSION: No acute chest findings.   Original Report Authenticated By: Richarda Overlie, M.D.    Dg Abd 1 View  08/23/2012  *RADIOLOGY REPORT*  Clinical Data: Nausea, vomiting, abdominal pain  ABDOMEN - 1 VIEW  Comparison:  None  Findings: Lung bases are clear. The bowel gas pattern is non- obstructive. Organ outlines are normal where seen. Probable hepatic granuloma.No acute or aggressive osseous abnormality identified.   IMPRESSION: Nonobstructive bowel gas pattern.   Original Report Authenticated By: Jearld Lesch, M.D.    Ct Abdomen Pelvis W Contrast  08/23/2012  *RADIOLOGY REPORT*  Clinical Data: Abdominal pain  CT ABDOMEN AND PELVIS WITH CONTRAST  Technique:  Multidetector CT imaging of the abdomen and pelvis was performed following the standard protocol during bolus administration of intravenous contrast.  Contrast: 80mL OMNIPAQUE IOHEXOL 300 MG/ML  SOLN  Comparison: 08/23/2012 radiograph, 06/04/2011 spine CT  Findings: Linear opacity within the lingula is likely scarring or atelectasis.  Normal heart size.  No pleural or pericardial effusion.  Subcentimeter hypodensity within the liver on series 2 image 21.  Unremarkable biliary system, pancreas, adrenal glands. Punctate calcifications scattered throughout the spleen are in keeping with sequelae of prior granulomas infection.  Symmetric renal enhancement with exception of a couple too small further characterize hypodensities within each kidney.  No hydronephrosis or hydroureter.  No CT evidence for colitis.  There  is mild colonic diverticulosis. Appendix absent.  Decompressed distal small bowel loops. There are several small bowel loops with mucosal hyperenhancement and bowel wall thickening, for example series 2 images 54 - 57. Just  proximal to this, there is a fecalized loop of small bowel as seen on series 2 image 62 along the right pelvic sidewall.  Small amount of intraperitoneal fluid.  There is mesenteric fat stranding.  No free intraperitoneal air.  No lymphadenopathy.  There is scattered atherosclerotic calcification of the aorta and its branches. No aneurysmal dilatation.  Thin-walled bladder.  Absent uterus.  No adnexal mass.  Osteopenia and multilevel degenerative changes.  Postoperative changes of the lower thoracic/upper lumbar spine.  IMPRESSION: Thickened loop of small bowel within the pelvis with associated surrounding fluid and fat stranding.  This suggests a nonspecific enteritis with ischemic, inflammatory, and infectious considerations.  Small bowel proximal to this point is dilated, in keeping with a small bowel obstruction pattern. Unclear if this is functional or mechanical. Recommend surgical and GI consultation and consider capsule endoscopy follow-up to exclude an underlying lesion.   Original Report Authenticated By: Jearld Lesch, M.D.     Review of Systems  Constitutional: Negative.   HENT: Negative.   Eyes: Negative.   Respiratory: Negative.   Cardiovascular: Negative.   Gastrointestinal: Positive for nausea, vomiting and abdominal pain.  Genitourinary: Negative.   Musculoskeletal: Negative.   Skin: Negative.   Neurological: Negative.   Endo/Heme/Allergies: Negative.   Psychiatric/Behavioral: Negative.     Blood pressure 103/53, pulse 81, resp. rate 18, SpO2 93.00%. Physical Exam  Constitutional: She is oriented to person, place, and time. She appears well-developed and well-nourished.  HENT:  Head: Normocephalic and atraumatic.  Eyes: Conjunctivae normal and EOM are normal. Pupils are equal, round, and reactive to light.  Neck: Normal range of motion. Neck supple.  Cardiovascular: Normal rate, regular rhythm and normal heart sounds.   Respiratory: Effort normal and breath sounds  normal.  GI: Soft. Bowel sounds are normal.       Abdomen is soft and does not appear distended. She has good bs. There is mild tenderness and no guarding  Musculoskeletal: Normal range of motion.  Neurological: She is alert and oriented to person, place, and time.  Skin: Skin is warm and dry.  Psychiatric: She has a normal mood and affect. Her behavior is normal.     Assessment/Plan The pt appears to have a partial sbo which is most likely from adhesions from previous hysterectomy. Fortunately her abdomen is very soft and flat. I would recommend admission for bowel rest and IV hydration and close monitoring TOTH III,PAUL S 08/23/2012, 5:46 AM

## 2012-08-23 NOTE — ED Notes (Signed)
Pt states she cannot complete her second cup of contrast. Pt states she vomited x1. Pain is starting to come back. 4/10. Md notified

## 2012-08-23 NOTE — ED Notes (Signed)
Pt back from CT

## 2012-08-24 ENCOUNTER — Observation Stay (HOSPITAL_COMMUNITY): Payer: Medicare Other

## 2012-08-24 LAB — BASIC METABOLIC PANEL
BUN: 10 mg/dL (ref 6–23)
CO2: 25 mEq/L (ref 19–32)
Calcium: 8.6 mg/dL (ref 8.4–10.5)
Chloride: 109 mEq/L (ref 96–112)
Creatinine, Ser: 0.82 mg/dL (ref 0.50–1.10)
GFR calc Af Amer: 82 mL/min — ABNORMAL LOW (ref >90)
GFR calc non Af Amer: 71 mL/min — ABNORMAL LOW (ref >90)
Glucose, Bld: 120 mg/dL — ABNORMAL HIGH (ref 70–99)
Potassium: 4.7 mEq/L (ref 3.5–5.1)
Sodium: 139 mEq/L (ref 135–145)

## 2012-08-24 LAB — CBC
HCT: 31.5 % — ABNORMAL LOW (ref 36.0–46.0)
Hemoglobin: 10 g/dL — ABNORMAL LOW (ref 12.0–15.0)
MCH: 31.7 pg (ref 26.0–34.0)
MCHC: 31.7 g/dL (ref 30.0–36.0)
MCV: 100 fL (ref 78.0–100.0)
Platelets: 186 10*3/uL (ref 150–400)
RBC: 3.15 MIL/uL — ABNORMAL LOW (ref 3.87–5.11)
RDW: 13.9 % (ref 11.5–15.5)
WBC: 9 10*3/uL (ref 4.0–10.5)

## 2012-08-24 NOTE — Progress Notes (Signed)
Agree with A&P of JD,NP. She feels better but still mild pain. No gas or BM, x-rays not better. Plan recheck tomorrow but may need to go to the OR

## 2012-08-24 NOTE — Progress Notes (Signed)
Subjective: Feels better this morning than on admission; now just some soreness, no nausea or emesis, remains NPO.  Objective: Vital signs in last 24 hours: Temp:  [98.8 F (37.1 C)-99.1 F (37.3 C)] 98.8 F (37.1 C) (12/23 0511) Pulse Rate:  [73-121] 74  (12/23 0511) Resp:  [18] 18  (12/23 0511) BP: (102-159)/(55-82) 125/55 mmHg (12/23 0511) SpO2:  [95 %-99 %] 97 % (12/23 0511) Last BM Date: 08/23/12  Intake/Output from previous day: 12/22 0701 - 12/23 0700 In: 435 [I.V.:435] Out: -  Intake/Output this shift:    General appearance: alert, cooperative, appears stated age and no distress Abdomen: non distended, No BS, flatus, No N/V remains NPO, generalized tenderness to palpation. No guarding.  Lab Results:   Basename 08/24/12 0700 08/22/12 2259  WBC 9.0 9.4  HGB 10.0* 12.0  HCT 31.5* 34.8*  PLT 186 220   BMET  Basename 08/24/12 0700 08/22/12 2259  NA 139 138  K 4.7 3.6  CL 109 98  CO2 25 25  GLUCOSE 120* 115*  BUN 10 17  CREATININE 0.82 0.76  CALCIUM 8.6 10.0   PT/INR  Basename 08/22/12 2259  LABPROT 12.3  INR 0.92   ABG No results found for this basename: PHART:2,PCO2:2,PO2:2,HCO3:2 in the last 72 hours  Studies/Results: Dg Chest 1 View  08/23/2012  *RADIOLOGY REPORT*  Clinical Data: Chest pain.  CHEST - 1 VIEW  Comparison: None.  Findings: Single view of the chest was obtained.  There is a round calcified granuloma in the lateral left lung.  Heart size is within normal limits.  The lungs are clear without focal airspace disease.  IMPRESSION: No acute chest findings.   Original Report Authenticated By: Richarda Overlie, M.D.    Dg Abd 1 View  08/23/2012  *RADIOLOGY REPORT*  Clinical Data: Nausea, vomiting, abdominal pain  ABDOMEN - 1 VIEW  Comparison:  None  Findings: Lung bases are clear. The bowel gas pattern is non- obstructive. Organ outlines are normal where seen. Probable hepatic granuloma.No acute or aggressive osseous abnormality identified.    IMPRESSION: Nonobstructive bowel gas pattern.   Original Report Authenticated By: Jearld Lesch, M.D.    Ct Abdomen Pelvis W Contrast  08/23/2012  *RADIOLOGY REPORT*  Clinical Data: Abdominal pain  CT ABDOMEN AND PELVIS WITH CONTRAST  Technique:  Multidetector CT imaging of the abdomen and pelvis was performed following the standard protocol during bolus administration of intravenous contrast.  Contrast: 80mL OMNIPAQUE IOHEXOL 300 MG/ML  SOLN  Comparison: 08/23/2012 radiograph, 06/04/2011 spine CT  Findings: Linear opacity within the lingula is likely scarring or atelectasis.  Normal heart size.  No pleural or pericardial effusion.  Subcentimeter hypodensity within the liver on series 2 image 21.  Unremarkable biliary system, pancreas, adrenal glands. Punctate calcifications scattered throughout the spleen are in keeping with sequelae of prior granulomas infection.  Symmetric renal enhancement with exception of a couple too small further characterize hypodensities within each kidney.  No hydronephrosis or hydroureter.  No CT evidence for colitis.  There is mild colonic diverticulosis. Appendix absent.  Decompressed distal small bowel loops. There are several small bowel loops with mucosal hyperenhancement and bowel wall thickening, for example series 2 images 54 - 57. Just proximal to this, there is a fecalized loop of small bowel as seen on series 2 image 62 along the right pelvic sidewall.  Small amount of intraperitoneal fluid.  There is mesenteric fat stranding.  No free intraperitoneal air.  No lymphadenopathy.  There is scattered atherosclerotic calcification  of the aorta and its branches. No aneurysmal dilatation.  Thin-walled bladder.  Absent uterus.  No adnexal mass.  Osteopenia and multilevel degenerative changes.  Postoperative changes of the lower thoracic/upper lumbar spine.  IMPRESSION: Thickened loop of small bowel within the pelvis with associated surrounding fluid and fat stranding.  This  suggests a nonspecific enteritis with ischemic, inflammatory, and infectious considerations.  Small bowel proximal to this point is dilated, in keeping with a small bowel obstruction pattern. Unclear if this is functional or mechanical. Recommend surgical and GI consultation and consider capsule endoscopy follow-up to exclude an underlying lesion.   Original Report Authenticated By: Jearld Lesch, M.D.    Dg Abd Portable 2v  08/24/2012  *RADIOLOGY REPORT*  Clinical Data: Small bowel obstruction, follow-up  PORTABLE ABDOMEN - 2 VIEW  Comparison: CT abdomen pelvis of 08/23/2012 and KUB of 08/23/2012  Findings: Supine and left lateral decubitus films of the abdomen were obtained.  There is still some gaseous distention of small bowel loops with air-fluid levels consistent with persistent partial small bowel obstruction.  No free air is seen.  No distention of the colon is noted.  The bones are osteopenic.  IMPRESSION: Persistent partial small bowel obstruction.  No free air.   Original Report Authenticated By: Dwyane Dee, M.D.     Anti-infectives: Anti-infectives    None      Assessment/Plan:  Patient Active Problem List  Diagnosis  . NEOPLASM, SKIN, UNCERTAIN BEHAVIOR  . HYPERKALEMIA  . ANEMIA NOS  . DISORDER, STOMACH FUNCTION NEC  . OSTEOPENIA  . ABDOMINAL BRUIT  . Back pain  . Spinal cord tumor  . Medicare annual wellness visit, initial  . Wears hearing aid  . Medicare welcome visit  . Urinary frequency  . Dysuria  . Hx of benign neoplasm of spinal cord  . Fatigue  . Postprocedural pseudomeningocele  . Plantar wart of right foot  . Back pain with radiation  PSBO  Plan:  1. Keep NPO, IVF 2. Recheck abdominal films in am 3. Follow clinical picture 4. OOB/ambulate  s/p * No surgery found *   LOS: 2 days    Kaitlyn Flowers 08/24/2012

## 2012-08-25 ENCOUNTER — Inpatient Hospital Stay (HOSPITAL_COMMUNITY): Payer: Medicare Other

## 2012-08-25 NOTE — Progress Notes (Signed)
<  principal problem not specified>  Assessment: <principal problem not specified> Improving SBO  Plan: Start liquids and slow IV, advance in am as tolerated   Subjective: Feels better, no pain, passing gas and had a BM; no nausea  Objective: Vital signs in last 24 hours: Temp:  [98.1 F (36.7 C)-98.8 F (37.1 C)] 98.4 F (36.9 C) (12/24 0520) Pulse Rate:  [79-81] 80  (12/24 0520) Resp:  [18] 18  (12/24 0520) BP: (119-127)/(58-72) 127/72 mmHg (12/24 0520) SpO2:  [95 %-98 %] 97 % (12/24 0520) Last BM Date: 08/25/12  Intake/Output from previous day: 12/23 0701 - 12/24 0700 In: 850 [I.V.:850] Out: -  Intake/Output this shift:    General appearance: alert, cooperative and no distress Resp: clear to auscultation bilaterally GI: soft, non-tender; bowel sounds normal; no masses,  no organomegaly  Lab Results:  No results found for this or any previous visit (from the past 24 hour(s)).   Studies/Results Radiology Xray today improved, with decreased SB distention and contrast into colon    MEDS, Scheduled    . antiseptic oral rinse  15 mL Mouth Rinse q12n4p  . chlorhexidine  15 mL Mouth Rinse BID  . pantoprazole (PROTONIX) IV  40 mg Intravenous QHS       LOS: 3 days    Currie Paris, MD, [facility], [place] [phone]   08/25/2012 9:11 AM

## 2012-08-26 NOTE — Discharge Summary (Signed)
Physician Discharge Summary  Patient ID: Kaitlyn Flowers MRN: [id number] DOB/AGE: 11/01/1940 71 y.o.  Admit date: 08/22/2012 Discharge date: 08/26/2012  Admission Diagnoses: small bowel obstruction Discharge Diagnoses: same Active Problems:  * No active hospital problems. *    Discharged Condition: good  Hospital Course: The patient was admitted for a small bowel obstruction.  She resolved with NPO and medical management.  Consults: None  Significant Diagnostic Studies: labs: cbc, chemistries and radiology: X-Ray: Abd  Treatments: IV hydration  Discharge Exam: Blood pressure 125/50, pulse 85, temperature 98.3 F (36.8 C), temperature source Oral, resp. rate 16, height 5\' 1"  (1.549 m), weight 105 lb (47.628 kg), SpO2 97.00%. General appearance: alert and cooperative GI: soft, non-tender; bowel sounds normal; no masses,  no organomegaly  Disposition: 01-Home or Self Care  Discharge Orders    Future Orders Please Complete By Expires   Diet regular      Discharge patient          Medication List     As of 08/26/2012  9:58 AM    TAKE these medications         gabapentin 300 MG capsule   Commonly known as: NEURONTIN   Take 300 mg by mouth at bedtime as needed. For leg pain      PERFECT IRON 25 MG Tabs   Generic drug: Carbonyl Iron   Take 25 mg by mouth daily.         Signed: Brendt Dible C. 08/26/2012, 9:58 AM

## 2012-08-26 NOTE — Progress Notes (Signed)
Patient discharged to home.  Discharge teaching completed including follow up care, medications and diet.  Verbalizes understanding with no further questions. Tolerating regular diet without complaints of pain or nausea.  Vital signs stable.  Discharged per wheelchair.

## 2012-12-07 ENCOUNTER — Ambulatory Visit: Payer: Medicare Other | Admitting: Internal Medicine

## 2012-12-07 ENCOUNTER — Encounter: Payer: Medicare Other | Admitting: Internal Medicine

## 2012-12-07 NOTE — Progress Notes (Signed)
Opened for reviewed same day pt cancelled appt

## 2012-12-08 ENCOUNTER — Ambulatory Visit (INDEPENDENT_AMBULATORY_CARE_PROVIDER_SITE_OTHER): Payer: Medicare Other | Admitting: Family Medicine

## 2012-12-08 ENCOUNTER — Encounter: Payer: Self-pay | Admitting: Family Medicine

## 2012-12-08 VITALS — BP 124/70 | HR 90 | Temp 98.5°F | Wt 102.0 lb

## 2012-12-08 DIAGNOSIS — W57XXXA Bitten or stung by nonvenomous insect and other nonvenomous arthropods, initial encounter: Secondary | ICD-10-CM

## 2012-12-08 DIAGNOSIS — T148 Other injury of unspecified body region: Secondary | ICD-10-CM

## 2012-12-08 MED ORDER — MUPIROCIN 2 % EX OINT: TOPICAL_OINTMENT | Freq: Three times a day (TID) | CUTANEOUS | Status: DC

## 2012-12-08 NOTE — Progress Notes (Signed)
Chief Complaint  Patient presents with  . Tick Removal    HPI:  Acute visit for tick bite: -[place] -got bit 2 days ago, removed - was on L lower leg -feels like has had some redness and a little pain around it and wonders if infected -denies: fevers, rash, chills, headache, vomiting, malaise, feels great  ROS: See pertinent positives and negatives per HPI.  Past Medical History  Diagnosis Date  . Osteopenia   . 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  . Wears hearing aid   . PONV (postoperative nausea and vomiting)   . Anemia     Family History  Problem Relation Age of Onset  . Stroke Mother     2010  . Heart attack Father   . Anesthesia problems Neg Hx     History   Social History  . Marital Status: Legally Separated    Spouse Name: N/A    Number of Children: N/A  . Years of Education: N/A   Social History Main Topics  . Smoking status: Former Smoker -- 0.25 packs/day for 2 years    Types: Cigarettes    Quit date: 09/03/1967  . Smokeless tobacco: None  . Alcohol Use: 2.4 oz/week    4 Glasses of wine per week  . Drug Use: No  . Sexually Active: None   Other Topics Concern  . 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 For leg pain, Disp: , Rfl: ;  mupirocin ointment (BACTROBAN) 2 %, Apply topically 3 (three) times daily., Disp: 22 g, Rfl: 0  EXAM:  Filed Vitals:   12/08/12 0817  BP: 124/70  Pulse: 90  Temp: 98.5 F (36.9 C)    Body mass index is 19.28 kg/(m^2).  GENERAL: vitals reviewed and listed above, alert, oriented, appears well hydrated and in no acute distress  HEENT: atraumatic, conjunttiva  clear, no obvious abnormalities on inspection of external nose and ears  NECK: no obvious masses on inspection  SKIN: insect bite L LE with very minimal amount of surrounding erythema  MS: moves all extremities without noticeable abnormality  PSYCH: pleasant and cooperative, no obvious depression or anxiety  ASSESSMENT AND PLAN:  Discussed the following assessment and plan:  Tick bite - Plan: mupirocin ointment (BACTROBAN) 2 %  -appears to simply mild irritation near bite, discussed topical versus oral bax for possible very mild cellulitis - she prefers to try topical. Warned of tick bite associated illnesses and when to see a doctor. -Patient advised to return or notify a doctor immediately if symptoms worsen or persist or new concerns arise.  There are no Patient Instructions on file for this visit.   Kriste Basque R.

## 2012-12-11 ENCOUNTER — Encounter: Payer: Self-pay | Admitting: Internal Medicine

## 2012-12-16 ENCOUNTER — Encounter: Payer: Self-pay | Admitting: Internal Medicine

## 2012-12-16 ENCOUNTER — Ambulatory Visit (INDEPENDENT_AMBULATORY_CARE_PROVIDER_SITE_OTHER): Payer: Medicare Other | Admitting: Internal Medicine

## 2012-12-16 VITALS — BP 102/54 | HR 98 | Temp 98.4°F | Wt 104.0 lb

## 2012-12-16 DIAGNOSIS — W57XXXA Bitten or stung by nonvenomous insect and other nonvenomous arthropods, initial encounter: Secondary | ICD-10-CM | POA: Insufficient documentation

## 2012-12-16 DIAGNOSIS — R81 Glycosuria: Secondary | ICD-10-CM

## 2012-12-16 DIAGNOSIS — R300 Dysuria: Secondary | ICD-10-CM

## 2012-12-16 DIAGNOSIS — S40852A Superficial foreign body of left upper arm, initial encounter: Secondary | ICD-10-CM

## 2012-12-16 DIAGNOSIS — Z1839 Other retained organic fragments: Secondary | ICD-10-CM

## 2012-12-16 DIAGNOSIS — S40869A Insect bite (nonvenomous) of unspecified upper arm, initial encounter: Secondary | ICD-10-CM | POA: Insufficient documentation

## 2012-12-16 DIAGNOSIS — S40859A Superficial foreign body of unspecified upper arm, initial encounter: Secondary | ICD-10-CM | POA: Insufficient documentation

## 2012-12-16 DIAGNOSIS — N390 Urinary tract infection, site not specified: Secondary | ICD-10-CM

## 2012-12-16 DIAGNOSIS — S40269A Insect bite (nonvenomous) of unspecified shoulder, initial encounter: Secondary | ICD-10-CM

## 2012-12-16 DIAGNOSIS — R350 Frequency of micturition: Secondary | ICD-10-CM

## 2012-12-16 LAB — POCT URINALYSIS DIPSTICK
Bilirubin, UA: NEGATIVE
Ketones, UA: NEGATIVE
Nitrite, UA: POSITIVE
Spec Grav, UA: 1.015
Urobilinogen, UA: 1
pH, UA: 5

## 2012-12-16 LAB — GLUCOSE, POCT (MANUAL RESULT ENTRY): POC Glucose: 106 mg/dl — AB (ref 70–99)

## 2012-12-16 MED ORDER — SULFAMETHOXAZOLE-TRIMETHOPRIM 800-160 MG PO TABS: 1.0000 | ORAL_TABLET | Freq: Two times a day (BID) | ORAL | Status: DC

## 2012-12-16 NOTE — Progress Notes (Signed)
Chief Complaint  Patient presents with  . Dysuria     also new tick bite   . Urinary Frequency    HPI: Patient comes in today for SDA for  new problem evaluation. One day of dysuria frequency without fever flank pain or hematuria. She feels this is one of her typical UTIs. Last treated in the fall tends to do well with sulfa medicine allergic to penicillin. No fever or chills  Has a new tick bite on the left arm and she has a hard time getting it out. See past visit or had a tick bite on the right leg. No fevers or new rash around this area. Using antibiotic ointment on the leg.  No history of elevated blood sugar diabetes could use azo  for her UTI symptoms  ROS: See pertinent positives and negatives per HPI.  Past Medical History  Diagnosis Date  . Osteopenia   . 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  . Wears hearing aid   . PONV (postoperative nausea and vomiting)   . Anemia     Family History  Problem Relation Age of Onset  . Stroke Mother     2010  . Heart attack Father   . Anesthesia problems Neg Hx     History   Social History  . Marital Status: Legally Separated    Spouse Name: N/A    Number of Children: N/A  . Years of Education: N/A   Social History Main Topics  . Smoking status: Former Smoker -- 0.25 packs/day for 2 years    Types: Cigarettes    Quit date: 09/03/1967  . Smokeless tobacco: None  . Alcohol Use: 2.4 oz/week    4 Glasses of wine per week  . Drug Use: No  . Sexually Active: None   Other Topics Concern  . 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 Carbonyl Iron (PERFECT IRON) 25 MG TABS Take 25 mg by mouth daily.      Marland Kitchen gabapentin (NEURONTIN) 300 MG capsule Take 300 mg by mouth at bedtime as needed. For leg  pain      . mupirocin ointment (BACTROBAN) 2 % Apply topically 3 (three) times daily.  22 g  0  . sulfamethoxazole-trimethoprim (SEPTRA DS) 800-160 MG per tablet Take 1 tablet by mouth 2 (two) times daily.  10 tablet  0   No facility-administered encounter medications on file as of 12/16/2012.    EXAM:  BP 102/54  Pulse 98  Temp(Src) 98.4 F (36.9 C) (Oral)  Wt 104 lb (47.174 kg)  BMI 19.66 kg/m2  SpO2 97%  Body mass index is 19.66 kg/(m^2).  GENERAL: vitals reviewed and listed above, alert, oriented, appears well hydrated and in no acute distress  HEENT: atraumatic, conjunctiva  clear, no obvious abnormalities on inspection of external nose and ears OP : no lesion edema or exudate   NECK: no obvious masses on inspection palpation   Left  Upper arm with 3 mm tick like structure imbedded ion arm    With steril forceps  Part removed  Removed in pieces with tweezers and then 20 g needle    Small amount of blood  Cleaned  Antibiotic ointment and cover.  Tolerated well.   CV: HRRR, no clubbing cyanosis   MS: moves all extremities without noticeable focal  abnormality  PSYCH: pleasant and cooperative, no obvious depression or anxiety  ASSESSMENT AND PLAN:  Discussed the following assessment and plan:  Dysuria - Plan: POC Urinalysis Dipstick  Urinary frequency - Plan: POC Urinalysis Dipstick  Glucosuria - nl random cgb prob false read with azo  - Plan: POC Glucose (CBG)  UTI (urinary tract infection)  Embedded tick of upper arm, left, initial encounter - removed with some effort  forept and 20 g needle  Local care     Expectant management.   -Patient advised to return or notify health care team  if symptoms worsen or persist or new concerns arise.  Patient Instructions  Contact us if still having sx  After finishing medication.   Local care to the tick bit area  Contact us as needed rash other concerns.    Neta Mends. Panosh M.D.

## 2012-12-16 NOTE — Patient Instructions (Signed)
Contact us if still having sx  After finishing medication.   Local care to the tick bit area  Contact us as needed rash other concerns.

## 2013-07-08 ENCOUNTER — Other Ambulatory Visit: Payer: Self-pay

## 2013-10-18 ENCOUNTER — Telehealth: Payer: Self-pay | Admitting: Internal Medicine

## 2013-10-18 NOTE — Telephone Encounter (Signed)
Ok to do referral  But need more information  For the referral ? Knee injury which one  Etc.

## 2013-10-18 NOTE — Telephone Encounter (Signed)
Pt is [place] today, coming home later on this evenin (if can).  pt has hurt knee, and would like asap referral to orthopedic. Pt unsure is she needs appt w/ dr Regis Bill first, or can we just send referral? Pt has made appt tomorrow w/ dr Regis Bill if she needs.

## 2013-10-19 ENCOUNTER — Ambulatory Visit: Payer: Medicare FFS | Admitting: Internal Medicine

## 2013-10-20 NOTE — Telephone Encounter (Signed)
Left a message at the below listed number for the pt to return my call. 

## 2013-10-20 NOTE — Telephone Encounter (Signed)
Pt was able to get a referral from her chiropractor. Pt will see dr Durward Fortes on friday

## 2013-12-06 ENCOUNTER — Telehealth: Payer: Self-pay | Admitting: Internal Medicine

## 2013-12-06 DIAGNOSIS — M8580 Other specified disorders of bone density and structure, unspecified site: Secondary | ICD-10-CM

## 2013-12-06 NOTE — Telephone Encounter (Signed)
Pt would like a referral to [facility] for bmd. Pt last bone density test was in April 2012. Can I [place]?

## 2013-12-06 NOTE — Telephone Encounter (Signed)
I have placed the order for bone density.  Please make appt for testing. Patient also needs to come in for physical to see WP.  She is past due. Thanks!

## 2013-12-06 NOTE — Telephone Encounter (Signed)
lmom for pt to callback and sch °

## 2013-12-07 NOTE — Telephone Encounter (Signed)
Pt has been sch

## 2013-12-10 ENCOUNTER — Ambulatory Visit (INDEPENDENT_AMBULATORY_CARE_PROVIDER_SITE_OTHER)
Admission: RE | Admit: 2013-12-10 | Discharge: 2013-12-10 | Disposition: A | Discharge status: Home / Self Care | Payer: Medicare FFS | Source: Ambulatory Visit | Attending: Internal Medicine | Admitting: Internal Medicine

## 2013-12-10 DIAGNOSIS — M949 Disorder of cartilage, unspecified: Secondary | ICD-10-CM

## 2013-12-10 DIAGNOSIS — M8580 Other specified disorders of bone density and structure, unspecified site: Secondary | ICD-10-CM

## 2013-12-10 DIAGNOSIS — M899 Disorder of bone, unspecified: Secondary | ICD-10-CM

## 2013-12-26 ENCOUNTER — Encounter: Payer: Self-pay | Admitting: Internal Medicine

## 2013-12-26 DIAGNOSIS — M810 Age-related osteoporosis without current pathological fracture: Secondary | ICD-10-CM | POA: Insufficient documentation

## 2013-12-27 ENCOUNTER — Telehealth: Payer: Self-pay | Admitting: Internal Medicine

## 2013-12-27 NOTE — Telephone Encounter (Signed)
Patient given results and she has made future follow up appt with [facility] to discuss results.

## 2013-12-27 NOTE — Telephone Encounter (Signed)
Patient calling to request results of bone density test performed earlier this month.

## 2013-12-31 ENCOUNTER — Ambulatory Visit: Payer: Medicare FFS | Admitting: Internal Medicine

## 2014-01-07 ENCOUNTER — Ambulatory Visit (INDEPENDENT_AMBULATORY_CARE_PROVIDER_SITE_OTHER): Payer: Medicare FFS | Admitting: Internal Medicine

## 2014-01-07 ENCOUNTER — Encounter: Payer: Self-pay | Admitting: Internal Medicine

## 2014-01-07 VITALS — BP 114/52 | Temp 97.8°F | Ht 60.75 in | Wt 101.0 lb

## 2014-01-07 DIAGNOSIS — M810 Age-related osteoporosis without current pathological fracture: Secondary | ICD-10-CM

## 2014-01-07 DIAGNOSIS — D649 Anemia, unspecified: Secondary | ICD-10-CM

## 2014-01-07 DIAGNOSIS — M545 Low back pain, unspecified: Secondary | ICD-10-CM

## 2014-01-07 LAB — POCT HEMOGLOBIN: Hemoglobin: 11.6 g/dL — AB (ref 12.2–16.2)

## 2014-01-07 NOTE — Patient Instructions (Addendum)
Consider seeing sport medicine about the  Back area .  Call us if you wish to proceed . Will get Dr Cruzita Lederer  To see you about  Future   intervention for the osteoporosis. mostlikely medication is advised .   May need more lab work such as repeat vit d level  To evaluate best path.   Suggest preventive visit in 4-6 months to    Osteoporosis Throughout your life, your body breaks down old bone and replaces it with new bone. As you get older, your body does not replace bone as quickly as it breaks it down. By the age of 73 years, most people begin to gradually lose bone because of the imbalance between bone loss and replacement. Some people lose more bone than others. Bone loss beyond a specified normal degree is considered osteoporosis.  Osteoporosis affects the strength and durability of your bones. The inside of the ends of your bones and your flat bones, like the bones of your pelvis, look like honeycomb, filled with tiny open spaces. As bone loss occurs, your bones become less dense. This means that the open spaces inside your bones become bigger and the walls between these spaces become thinner. This makes your bones weaker. Bones of a person with osteoporosis can become so weak that they can break (fracture) during minor accidents, such as a simple fall. CAUSES  The following factors have been associated with the development of osteoporosis:  Smoking.  Drinking more than 2 alcoholic drinks several days per week.  Long-term use of certain medicines:  Corticosteroids.  Chemotherapy medicines.  Thyroid medicines.  Antiepileptic medicines.  Gonadal hormone suppression medicine.  Immunosuppression medicine.  Being underweight.  Lack of physical activity.  Lack of exposure to the sun. This can lead to vitamin D deficiency.  Certain medical conditions:  Certain inflammatory bowel diseases, such as Crohn disease and ulcerative  colitis.  Diabetes.  Hyperthyroidism.  Hyperparathyroidism. RISK FACTORS Anyone can develop osteoporosis. However, the following factors can increase your risk of developing osteoporosis:  Gender Women are at higher risk than men.  Age Being older than 99 years increases your risk.  Ethnicity White and Asian people have an increased risk.  Weight Being extremely underweight can increase your risk of osteoporosis.  Family history of osteoporosis Having a family member who has developed osteoporosis can increase your risk. SYMPTOMS  Usually, people with osteoporosis have no symptoms.  DIAGNOSIS  Signs during a physical exam that may prompt your caregiver to suspect osteoporosis include:  Decreased height. This is usually caused by the compression of the bones that form your spine (vertebrae) because they have weakened and become fractured.  A curving or rounding of the upper back (kyphosis). To confirm signs of osteoporosis, your caregiver may request a procedure that uses 2 low-dose X-ray beams with different levels of energy to measure your bone mineral density (dual-energy X-ray absorptiometry [DXA]). Also, your caregiver may check your level of vitamin D. TREATMENT  The goal of osteoporosis treatment is to strengthen bones in order to decrease the risk of bone fractures. There are different types of medicines available to help achieve this goal. Some of these medicines work by slowing the processes of bone loss. Some medicines work by increasing bone density. Treatment also involves making sure that your levels of calcium and vitamin D are adequate. PREVENTION  There are things you can do to help prevent osteoporosis. Adequate intake of calcium and vitamin D can help you achieve optimal bone mineral  density. Regular exercise can also help, especially resistance and weight-bearing activities. If you smoke, quitting smoking is an important part of osteoporosis prevention. MAKE SURE  YOU:  Understand these instructions.  Will watch your condition.  Will get help right away if you are not doing well or get worse. FOR MORE INFORMATION www.osteo.org and [employer].ee Document Released: 05/29/2005 Document Revised: 12/14/2012 Document Reviewed: 08/03/2011 [facility] Patient Information 2014 [place], [place].

## 2014-01-07 NOTE — Progress Notes (Signed)
Chief Complaint  Patient presents with  . Follow-up    HPI: Here for fu of her dexa scan which shows hip in osteoporosis range t -2.7  Visit family history of osteoporosis, she has had her vitamin D level checked in the past and was okay but doesn't remember when.  Also states that over the last couple months   has had Right low back pain  Aches deep ache.    Over the last months. Insidious onset  Better with rest. And also better with her exercise regimen and stretching. It is a new pain  She does have residual neurologic pain from her back surgery predicament but is unable to tolerate gabapentin and Lyrica also just lives with it. Nice any falls or balance problems.  She hasn't had blood work done probably since her surgery her last visit with me was a year ago for a UTI.  Is continuing to work in exercise and otherwise feels well. ROS: See pertinent positives and negatives per HPI. Negative for chest pain shortness of breath  Past Medical History  Diagnosis Date  . Osteopenia   . 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  . Wears hearing aid   . PONV (postoperative nausea and vomiting)   . Anemia     Family History  Problem Relation Age of Onset  . Stroke Mother     2010  . Heart attack Father   . Anesthesia problems Neg Hx     History   Social History  . Marital Status: Legally Separated    Spouse Name: N/A    Number of Children: N/A  . Years of Education: N/A   Social History Main Topics  . Smoking status: Former Smoker -- 0.25 packs/day for 2 years    Types: Cigarettes    Quit date: 09/03/1967  . Smokeless tobacco: None  . Alcohol Use: 2.4 oz/week    4 Glasses of wine per week  . Drug Use: No  . Sexual Activity: None   Other Topics Concern  . 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 Carbonyl Iron (PERFECT IRON) 25 MG TABS Take 25 mg by mouth daily.  . Cholecalciferol (VITAMIN D3) 5000 UNITS CAPS Take 1 capsule by mouth daily.  . [DISCONTINUED] gabapentin (NEURONTIN) 300 MG capsule Take 300 mg by mouth at bedtime as needed. For leg pain  . [DISCONTINUED] mupirocin ointment (BACTROBAN) 2 % Apply topically 3 (three) times daily.  . [DISCONTINUED] sulfamethoxazole-trimethoprim (SEPTRA DS) 800-160 MG per tablet Take 1 tablet by mouth 2 (two) times daily.    EXAM:  BP 114/52  Temp(Src) 97.8 F (36.6 C) (Oral)  Ht 5' 0.75" (1.543 m)  Wt 101 lb (45.813 kg)  BMI 19.24 kg/m2  Body mass index is 19.24 kg/(m^2).  GENERAL: vitals reviewed and listed above, alert, oriented, appears well hydrated and in no acute distress HEENT: atraumatic, conjunctiva  clear, no obvious abnormalities on inspection of external nose and ears OP : no lesion edema or exudate  NECK: no obvious masses on inspection palpation  LUNGS: clear to auscultation bilaterally, no wheezes, rales or rhonchi,  Back points to the right SI area as area of tenderness but her gait is nonfocal. CV: HRRR, no  clubbing cyanosis or  peripheral edema nl cap refill  MS: moves all extremities without noticeable focal  Abnormality  PSYCH: pleasant and cooperative, no obvious depression or anxiety Skin no obvious bruising or bleeding Lab Results  Component Value Date   WBC 9.0 08/24/2012   HGB 11.6* 01/07/2014   HCT 31.5* 08/24/2012   PLT 186 08/24/2012   GLUCOSE 120* 08/24/2012   CHOL 231* 11/27/2010   TRIG 41.0 11/27/2010   HDL 101.80 11/27/2010   LDLDIRECT 110.1 11/27/2010   LDLCALC 110* 04/09/2007   ALT 14 08/22/2012   AST 22 08/22/2012   NA 139 08/24/2012   K 4.7 08/24/2012   CL 109 08/24/2012   CREATININE 0.82 08/24/2012   BUN 10 08/24/2012   CO2 25 08/24/2012   TSH 1.11 11/11/2011   INR 0.92 08/22/2012    ASSESSMENT AND PLAN:  Discussed the following  assessment and plan:  Osteoporosis - Plan: Ambulatory referral to Endocrinology  Anemia - Plan: POCT hemoglobin  Right low back pain - si area better with stretching and pilates continue and if not better contact us for poss refer to sprots medicine She is overdue for a preventive visit and evaluation of her health care needs. She will probably need more blood work done for metabolic evaluation but since she wants to discuss possibilities with the specialist will wait on further blood draws. The right low back pain seems to get better with stretching can wait and consider x-ray in sports medicine to see it doesn't appear to be related to her spinal cord tumor removal. -Patient advised to return or notify health care team  if symptoms worsen ,persist or new concerns arise.  Patient Instructions  Consider seeing sport medicine about the  Back area .  Call us if you wish to proceed . Will get Dr Cruzita Lederer  To see you about  Future   intervention for the osteoporosis. mostlikely medication is advised .   May need more lab work such as repeat vit d level  To evaluate best path.   Suggest preventive visit in 4-6 months to    Osteoporosis Throughout your life, your body breaks down old bone and replaces it with new bone. As you get older, your body does not replace bone as quickly as it breaks it down. By the age of 64 years, most people begin to gradually lose bone because of the imbalance between bone loss and replacement. Some people lose more bone than others. Bone loss beyond a specified normal degree is considered osteoporosis.  Osteoporosis affects the strength and durability of your bones. The inside of the ends of your bones and your flat bones, like the bones of your pelvis, look like honeycomb, filled with tiny open spaces. As bone loss occurs, your bones become less dense. This means that the open spaces inside your bones become bigger and the walls between these spaces become thinner. This  makes your bones weaker. Bones of a person with osteoporosis can become so weak that they can break (fracture) during minor accidents, such as a simple fall. CAUSES  The following factors have been associated with the development of osteoporosis:  Smoking.  Drinking more than 2 alcoholic drinks several days per week.  Long-term use of certain medicines:  Corticosteroids.  Chemotherapy medicines.  Thyroid medicines.  Antiepileptic medicines.  Gonadal hormone suppression medicine.  Immunosuppression medicine.  Being underweight.  Lack of physical activity.  Lack of exposure to the sun. This can lead to vitamin D deficiency.  Certain medical conditions:  Certain inflammatory bowel diseases, such as Crohn disease and ulcerative colitis.  Diabetes.  Hyperthyroidism.  Hyperparathyroidism. RISK FACTORS Anyone can develop osteoporosis. However, the following factors can increase your risk of developing osteoporosis:  Gender Women are at higher risk than men.  Age Being older than 28 years increases your risk.  Ethnicity White and Asian people have an increased risk.  Weight Being extremely underweight can increase your risk of osteoporosis.  Family history of osteoporosis Having a family member who has developed osteoporosis can increase your risk. SYMPTOMS  Usually, people with osteoporosis have no symptoms.  DIAGNOSIS  Signs during a physical exam that may prompt your caregiver to suspect osteoporosis include:  Decreased height. This is usually caused by the compression of the bones that form your spine (vertebrae) because they have weakened and become fractured.  A curving or rounding of the upper back (kyphosis). To confirm signs of osteoporosis, your caregiver may request a procedure that uses 2 low-dose X-ray beams with different levels of energy to measure your bone mineral density (dual-energy X-ray absorptiometry [DXA]). Also, your caregiver may check your  level of vitamin D. TREATMENT  The goal of osteoporosis treatment is to strengthen bones in order to decrease the risk of bone fractures. There are different types of medicines available to help achieve this goal. Some of these medicines work by slowing the processes of bone loss. Some medicines work by increasing bone density. Treatment also involves making sure that your levels of calcium and vitamin D are adequate. PREVENTION  There are things you can do to help prevent osteoporosis. Adequate intake of calcium and vitamin D can help you achieve optimal bone mineral density. Regular exercise can also help, especially resistance and weight-bearing activities. If you smoke, quitting smoking is an important part of osteoporosis prevention. MAKE SURE YOU:  Understand these instructions.  Will watch your condition.  Will get help right away if you are not doing well or get worse. FOR MORE INFORMATION www.osteo.org and EquipmentWeekly.com.ee Document Released: 05/29/2005 Document Revised: 12/14/2012 Document Reviewed: 08/03/2011 [facility] Patient Information 2014 [place], [place].    Standley Brooking. Panosh M.D.   Pre visit review using our clinic review tool, if applicable. No additional management support is needed unless otherwise documented below in the visit note.

## 2014-02-03 ENCOUNTER — Ambulatory Visit: Payer: Medicare FFS | Admitting: Internal Medicine

## 2014-02-10 ENCOUNTER — Ambulatory Visit (INDEPENDENT_AMBULATORY_CARE_PROVIDER_SITE_OTHER): Payer: Medicare FFS | Admitting: Internal Medicine

## 2014-02-10 ENCOUNTER — Encounter: Payer: Self-pay | Admitting: Internal Medicine

## 2014-02-10 VITALS — BP 90/58 | HR 87 | Temp 98.0°F | Resp 12 | Ht 61.5 in | Wt 103.0 lb

## 2014-02-10 DIAGNOSIS — M810 Age-related osteoporosis without current pathological fracture: Secondary | ICD-10-CM

## 2014-02-10 LAB — BASIC METABOLIC PANEL
BUN: 16 mg/dL (ref 6–23)
CO2: 28 mEq/L (ref 19–32)
Calcium: 9.7 mg/dL (ref 8.4–10.5)
Chloride: 98 mEq/L (ref 96–112)
Creatinine, Ser: 0.8 mg/dL (ref 0.4–1.2)
GFR: 77.2 mL/min (ref >60.00)
Glucose, Bld: 89 mg/dL (ref 70–99)
Potassium: 4.9 mEq/L (ref 3.5–5.1)
Sodium: 132 mEq/L — ABNORMAL LOW (ref 135–145)

## 2014-02-10 LAB — VITAMIN D 25 HYDROXY (VIT D DEFICIENCY, FRACTURES): VITD: 60.65 ng/mL

## 2014-02-10 NOTE — Patient Instructions (Signed)
Please stop at the lab. Continue vitamin D 5000 units daily for now. Please make sure you get at least 1000 mg calcium from the diet. If not, you may need a calcium supplement. Please think about starting Fosamax once a week. Please return in 1 year.  Exercise for Strong Bones (from [employer]) There are two types of exercises that are important for building and maintaining bone density:  weight-bearing and muscle-strengthening exercises. Weight-bearing Exercises These exercises include activities that make you move against gravity while staying upright. Weight-bearing exercises can be high-impact or low-impact. High-impact weight-bearing exercises help build bones and keep them strong. If you have broken a bone due to osteoporosis or are at risk of breaking a bone, you may need to avoid high-impact exercises. If you're not sure, you should check with your healthcare provider. Examples of high-impact weight-bearing exercises are:   Dancing   Doing high-impact aerobics   Hiking   Jogging/running   Jumping Rope   Stair climbing   Tennis Low-impact weight-bearing exercises can also help keep bones strong and are a safe alternative if you cannot do high-impact exercises. Examples of low-impact weight-bearing exercises are:   Using elliptical training machines   Doing low-impact aerobics   Using stair-step machines   Fast walking on a treadmill or outside Muscle-Strengthening Exercises These exercises include activities where you move your body, a weight or some other resistance against gravity. They are also known as resistance exercises and include:   Lifting weights   Using elastic exercise bands   Using weight machines   Lifting your own body weight   Functional movements, such as standing and rising up on your toes Yoga and Pilates can also improve strength, balance and flexibility. However, certain positions may not be safe for people with osteoporosis or those  at increased risk of broken bones. For example, exercises that have you bend forward may increase the chance of breaking a bone in the spine. A physical therapist should be able to help you learn which exercises are safe and appropriate for you. Non-Impact Exercises Non-impact exercises can help you to improve balance, posture and how well you move in everyday activities. These exercises can also help to increase muscle strength and decrease the risk of falls and broken bones. Some of these exercises include:   Balance exercises that strengthen your legs and test your balance, such as Tai Chi, can decrease your risk of falls.   Posture exercises that improve your posture and reduce rounded or "sloping" shoulders can help you decrease the chance of breaking a bone, especially in the spine.   Functional exercises that improve how well you move can help you with everyday activities and decrease your chance of falling and breaking a bone. For example, if you have trouble getting up from a chair or climbing stairs, you should do these activities as exercises. A physical therapist can teach you balance, posture and functional exercises. Starting a New Exercise Program If you haven't exercised regularly for a while, check with your healthcare provider before beginning a new exercise program-particularly if you have health problems such as heart disease, diabetes or high blood pressure. If you're at high risk of breaking a bone, you should work with a physical therapist to develop a safe exercise program. Once you have your healthcare provider's approval, start slowly. If you've already broken bones in the spine because of osteoporosis, be very careful to avoid activities that require reaching down, bending forward, rapid twisting motions,  heavy lifting and those that increase your chance of a fall. As you get started, your muscles may feel sore for a day or two after you exercise. If soreness lasts longer, you  may be working too hard and need to ease up. Exercises should be done in a pain-free range of motion. How Much Exercise Do You Need? Weight-bearing exercises 30 minutes on most days of the week. Do a 30-minutesession or multiple sessions spread out throughout the day. The benefits to your bones are the same.   Muscle-strengthening exercises Two to three days per week. If you don't have much time for strengthening/resistance training, do small amounts at a time. You can do just one body part each day. For example do arms one day, legs the next and trunk the next. You can also spread these exercises out during your normal day.  Balance, posture and functional exercises Every day or as often as needed. You may want to focus on one area more than the others. If you have fallen or lose your balance, spend time doing balance exercises. If you are getting rounded shoulders, work more on posture exercises. If you have trouble climbing stairs or getting up from the couch, do more functional exercises. You can also perform these exercises at one time or spread them during your day. Work with a phyiscal therapist to learn the right exercises for you.   How Can I Prevent Falls? Men and women with osteoporosis need to take care not to fall down. Falls can break bones. Some reasons people fall are: Poor vision  Poor balance  Certain diseases that affect how you walk  Some types of medicine, such as sleeping pills.  Some tips to help prevent falls outdoors are: Use a cane or walker  Wear rubber-soled shoes so you don't slip  Walk on grass when sidewalks are slippery  In winter, put salt or kitty litter on icy sidewalks.  Some ways to help prevent falls indoors are: Keep rooms free of clutter, especially on floors  Use plastic or carpet runners on slippery floors  Wear low-heeled shoes that provide good support  Do not walk in socks, stockings, or slippers  Be sure carpets and area rugs have skid-proof backs  or are tacked to the floor  Be sure stairs are well lit and have rails on both sides  Put grab bars on bathroom walls near tub, shower, and toilet  Use a rubber bath mat in the shower or tub  Keep a flashlight next to your bed  Use a sturdy step stool with a handrail and wide steps  Add more lights in rooms (and night lights) Buy a cordless phone to keep with you so that you don't have to rush to the phone       when it rings and so that you can call for help if you fall.   (adapted from http://www.niams.NightlifePreviews.se)  Dietary sources of calcium and vitamin D:  Calcium content (mg) - http://www.niams.MoviePins.co.za  Fortified oatmeal, 1 packet 350  Sardines, canned in oil, with edible bones, 3 oz. 324  Cheddar cheese, 1 oz. shredded 306  Milk, nonfat, 1 cup 302  Milkshake, 1 cup 300  Yogurt, plain, low-fat, 1 cup 300  Soybeans, cooked, 1 cup 261  Tofu, firm, with calcium,  cup 204  Orange juice, fortified with calcium, 6 oz. 200-260 (varies)  Salmon, canned, with edible bones, 3 oz. 181  Pudding, instant, made with 2% milk,  cup 153  Baked  beans, 1 cup [place], 1% milk fat, 1 cup 138  Spaghetti, lasagna, 1 cup 125  Frozen yogurt, vanilla, soft-serve,  cup 103  Ready-to-eat cereal, fortified with calcium, 1 cup 100-1,000 (varies)  Cheese pizza, 1 slice 616  Fortified waffles, 2 100  Turnip greens, boiled,  cup 99  Broccoli, raw, 1 cup 90  Ice cream, vanilla,  cup 85  Soy or rice milk, fortified with calcium, 1 cup 80-500 (varies)   Vitamin D content (International Units, IU) - https://www.ars.usda.gov Cod liver oil, 1 tablespoon 1,360  Swordfish, cooked, 3 oz 566  Salmon (sockeye), cooked, 3 oz 447  Tuna fish, canned in water, drained, 3 oz 154  Orange juice fortified with vitamin D, 1 cup (check product labels, as amount of added vitamin D varies) 137  Milk, nonfat, reduced fat, and whole,  vitamin D-fortified, 1 cup 115-124  Yogurt, fortified with 20% of the daily value for vitamin D, 6 oz 80  Margarine, fortified, 1 tablespoon 60  Sardines, canned in oil, drained, 2 sardines 46  Liver, beef, cooked, 3 oz 42  Egg, 1 large (vitamin D is found in yolk) 41  Ready-to-eat cereal, fortified with 10% of the daily value for vitamin D, 0.75-1 cup  40  Cheese, Swiss, 1 oz 6

## 2014-02-10 NOTE — Progress Notes (Signed)
Patient ID: Kaitlyn Flowers, female   DOB: 1940-12-21, 73 y.o.   MRN: [id number]   HPI  Kaitlyn Flowers is a 73 y.o.-year-old female, referred by her PCP, Dr. Regis Bill, for management of osteoporosis.  Pt was dx with OP in 2015. She denies any falls but broke her R ankle roller skating.   No dizziness/vertigo/orthostasis.  I reviewed pt's DEXA scans: Date L1-L4 T score FN T score 33% distal Radius  12/10/2013 -1.7 RFN: -2.7 LFN: -2.5 n/a   L1-L4 decreased by 5.8% from 2012 Total mean hip BMD decreased 7.7%* from 2012    No h/o hyper/hypocalcemia. No h/o hyperparathyroidism. No h/o kidney stones. Lab Results  Component Value Date   CALCIUM 8.6 08/24/2012   CALCIUM 10.0 08/22/2012   CALCIUM 9.6 11/11/2011   CALCIUM 9.6 11/27/2010   CALCIUM 9.9 06/09/2007   CALCIUM 9.8 04/09/2007   No h/o thyrotoxicosis. Reviewed TSH recent levels:  Lab Results  Component Value Date   TSH 1.11 11/11/2011   TSH 1.12 11/27/2010   TSH 1.17 04/09/2007   + h/o vitamin D deficiency.  She takes 5000 IU vit D daily >> started in the last 3 mo.  No h/o CKD. Last BUN/Cr: Lab Results  Component Value Date   BUN 10 08/24/2012   CREATININE 0.82 08/24/2012   She does not take calcium supplements. She also eats dairy and green, leafy, vegetables.   + weight bearing exercises: walks, Pilates.  She does not take high vitamin A doses.  Pt does have a FH of osteoporosis in mother/sisters. + fractures: kyphosis in mother.   Menopause was at 26 - surgical TAH + BSO  ROS: Constitutional: no weight gain/loss, no fatigue, no subjective hyperthermia/hypothermia, + nocturia x1  Eyes: no blurry vision, no xerophthalmia ENT: no sore throat, no nodules palpated in throat, no dysphagia/odynophagia, no hoarseness Cardiovascular: no CP/SOB/palpitations/leg swelling Respiratory: no cough/SOB Gastrointestinal: no N/V/D/C Musculoskeletal: no muscle/+joint aches (neck) Skin: no rashes Neurological: no  tremors/numbness/tingling/dizziness Psychiatric: no depression/anxiety  Past Medical History  Diagnosis Date  . Osteopenia   . 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  . Wears hearing aid   . PONV (postoperative nausea and vomiting)   . Anemia    Past Surgical History  Procedure Laterality Date  . Abdominal hysterectomy  1973    1/2 of ovary left cyst on ovary  . Shoulder rotator culff   2006  . Spinal cord tumor  1987    2012 october   . Appendectomy      removed with hysterectomy  . Rotator cuff repair      left   . Lumbar wound debridement  12/06/2011    Procedure: LUMBAR WOUND DEBRIDEMENT;  Surgeon: Kristeen Miss, MD;  Location: [facility];  Service: Neurosurgery;  Laterality: N/A;  Repair of Pseudomeningocele Lumbar wound   History   Social History  . Marital Status: married    Spouse Name: N/A    Number of Children: 2   Occupational History  . consultant   Social History Main Topics  . Smoking status: Former Smoker -- 0.25 packs/day for 2 years    Types: Cigarettes    Quit date: 09/03/1967  . Smokeless tobacco: Not on file  . Alcohol Use: 2.4 oz/week    4-5 Glasses of wine per week  . 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 Carbonyl Iron (PERFECT IRON) 25 MG TABS Take 25 mg by mouth daily.      . Cholecalciferol (VITAMIN D3) 5000 UNITS CAPS Take 1 capsule by mouth daily.       No current facility-administered medications on file prior to visit.   Allergies  Allergen Reactions  . Penicillins Rash   Family History  Problem Relation Age of Onset  . Stroke Mother     2010  . Heart attack Father   . Anesthesia problems Neg Hx    PE: BP 90/58  Pulse 87  Temp(Src) 98 F (36.7 C) (Oral)  Resp 12  Ht 5' 1.5" (1.562  m)  Wt 103 lb (46.72 kg)  BMI 19.15 kg/m2  SpO2 97% Wt Readings from Last 3 Encounters:  02/10/14 103 lb (46.72 kg)  01/07/14 101 lb (45.813 kg)  12/16/12 104 lb (47.174 kg)   Constitutional: normal weight, in NAD. No kyphosis. Eyes: PERRLA, EOMI, no exophthalmos ENT: moist mucous membranes, no thyromegaly, no cervical lymphadenopathy Cardiovascular: RRR, No MRG Respiratory: CTA B Gastrointestinal: abdomen soft, NT, ND, BS+ Musculoskeletal: no deformities, strength intact in all 4 Skin: moist, warm, no rashes Neurological: no tremor with outstretched hands, DTR normal in all 4  Assessment: 1. Osteoporosis  Plan: 1. Osteoporosis - likely postmenopausal  - Discussed about increased risk of fracture, depending on the T score, greatly increased when the T score is lower than -2.5, but it is actually a continuum and -2.5 should not be regarded as an absolute threshold.  - We reviewed her last DEXA scan report and images together, and I explained that based on the T scores, she has an increased risk for fractures.  - We discussed about the different medication classes, benefits and side effects (including atypical fractures and ONJ - no dental workup in progress or planned). We can use po Bisphosphonates,  IV bisphosphonates (zoledronic acid = Reclast) or sq denosumab (Prolia). I don't think she is a candidate for Teriparatide at the moment.  I explained the mechanism of action and expected benefits. I suggested that we start with weekly Fosamax for at least 5 years. - we reviewed her dietary and supplemental calcium and vitamin D intake, which I believe are adequate. I advised her to continue this - given her specific instructions about food sources for these - see pt instructions  - discussed fall precautions  - given handout from [facility] Re: weight bearing exercises - advised to do this every day or at least 5/7 days - We will check the following tests:  Vitamin  D  BMP - she will need to think about the Fosamax. She tells me she does not like to take meds as she had SEs to many of them, but will consider this and let me know. - will see pt back in a year   Office Visit on 02/10/2014  Component Date Value Ref Range Status  . Sodium 02/10/2014 132* 135 - 145 mEq/L Final  . Potassium 02/10/2014 4.9  3.5 - 5.1 mEq/L Final  . Chloride 02/10/2014 98  96 - 112 mEq/L Final  . CO2 02/10/2014 28  19 - 32 mEq/L Final  . Glucose, Bld 02/10/2014 89  70 - 99 mg/dL Final  . BUN 02/10/2014 16  6 - 23 mg/dL Final  . Creatinine, Ser 02/10/2014 0.8  0.4 - 1.2 mg/dL Final  . Calcium 02/10/2014 9.7  8.4 - 10.5 mg/dL Final  . GFR 02/10/2014 77.20  >60.00 mL/min Final  . VITD 02/10/2014 60.65   Final   Deficient = <20Insufficient = 20 to <30Sufficient = 30 -100Upper Safety Limit = >100   Dear Ms. Kuck, The vit D is great! Calcium is normal, too. Your sodium is a little low - please stay hydrated and would check it again at the next visit with your PCP. Please let me know if you decided for the Fosamax. Sincerely, Philemon Kingdom MD

## 2014-05-30 ENCOUNTER — Other Ambulatory Visit (HOSPITAL_COMMUNITY): Payer: Self-pay | Admitting: Chiropractic Medicine

## 2014-05-30 ENCOUNTER — Ambulatory Visit (HOSPITAL_COMMUNITY)
Admission: RE | Admit: 2014-05-30 | Discharge: 2014-05-30 | Disposition: A | Discharge status: Home / Self Care | Payer: Medicare FFS | Source: Ambulatory Visit | Attending: Chiropractic Medicine | Admitting: Chiropractic Medicine

## 2014-05-30 DIAGNOSIS — M545 Low back pain, unspecified: Secondary | ICD-10-CM | POA: Insufficient documentation

## 2014-06-02 ENCOUNTER — Encounter: Payer: Self-pay | Admitting: Physician Assistant

## 2014-06-02 ENCOUNTER — Ambulatory Visit (INDEPENDENT_AMBULATORY_CARE_PROVIDER_SITE_OTHER): Payer: Medicare FFS | Admitting: Physician Assistant

## 2014-06-02 VITALS — BP 120/70 | HR 60 | Temp 97.7°F | Resp 18 | Wt 103.0 lb

## 2014-06-02 DIAGNOSIS — M549 Dorsalgia, unspecified: Secondary | ICD-10-CM

## 2014-06-02 MED ORDER — PREDNISONE 20 MG PO TABS: 20.0000 mg | ORAL_TABLET | Freq: Two times a day (BID) | ORAL | Status: DC

## 2014-06-02 NOTE — Progress Notes (Signed)
Subjective:    Patient ID: Kaitlyn Flowers, female    DOB: Feb 22, 1941, 73 y.o.   MRN: [id number]  Back Pain This is a new (has low back pain chronically, but this is different) problem. The current episode started 1 to 4 weeks ago (10 days). The problem occurs constantly. The problem is unchanged. The pain is present in the sacro-iliac. The quality of the pain is described as aching and burning. The pain radiates to the right thigh. The pain is at a severity of 9/10. The pain is severe. The pain is worse during the night. The symptoms are aggravated by sitting and standing. Pertinent negatives include no abdominal pain, bladder incontinence, bowel incontinence, chest pain, dysuria, fever, headaches, leg pain, numbness, paresis, paresthesias, pelvic pain, perianal numbness, tingling, weakness or weight loss. She has tried NSAIDs, chiropractic manipulation, ice, heat and home exercises for the symptoms. The treatment provided mild relief.      Review of Systems  Constitutional: Negative for fever, chills and weight loss.  Respiratory: Negative for shortness of breath.   Cardiovascular: Negative for chest pain.  Gastrointestinal: Negative for nausea, vomiting, abdominal pain, diarrhea and bowel incontinence.  Genitourinary: Negative for bladder incontinence, dysuria and pelvic pain.  Musculoskeletal: Positive for back pain.  Neurological: Negative for tingling, syncope, weakness, numbness, headaches and paresthesias.  All other systems reviewed and are negative.    Past Medical History  Diagnosis Date  . Osteopenia   . 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  . Wears hearing aid   . PONV (postoperative nausea and vomiting)   . Anemia     History   Social History  . Marital Status: Legally Separated    Spouse Name: N/A    Number of Children: N/A  . Years of Education: N/A   Occupational History  . Not on file.   Social History Main Topics  .  Smoking status: Former Smoker -- 0.25 packs/day for 2 years    Types: Cigarettes    Quit date: 09/03/1967  . Smokeless tobacco: Not on file  . Alcohol Use: 2.4 oz/week    4 Glasses of wine per week  . Drug Use: No  . Sexual Activity: Not on file   Other Topics Concern  . 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 Abdominal hysterectomy  1973    1/2 of ovary left cyst on ovary  . Shoulder rotator culff   2006  . Spinal cord tumor  1987    2012 october   . Appendectomy      removed with hysterectomy  . Rotator cuff repair      left   . Lumbar wound debridement  12/06/2011    Procedure: LUMBAR WOUND DEBRIDEMENT;  Surgeon: Kristeen Miss, MD;  Location: [facility];  Service: Neurosurgery;  Laterality: N/A;  Repair of Pseudomeningocele Lumbar wound    Family History  Problem Relation Age of Onset  . Stroke Mother     2010  . Heart attack Father   . Anesthesia problems Neg Hx     Allergies  Allergen Reactions  . Penicillins Rash    Current Outpatient Prescriptions on File Prior to Visit  Medication Sig Dispense Refill  . b complex vitamins  capsule Take 1 capsule by mouth daily.      Carin Hock Iron (PERFECT IRON) 25 MG TABS Take 25 mg by mouth daily.      . Cholecalciferol (VITAMIN D3) 5000 UNITS CAPS Take 1 capsule by mouth daily.      . pregabalin (LYRICA) 50 MG capsule Take 50 mg by mouth as needed.       No current facility-administered medications on file prior to visit.    EXAM: BP 120/70  Pulse 60  Temp(Src) 97.7 F (36.5 C) (Oral)  Resp 18  Wt 103 lb (46.72 kg)     Objective:   Physical Exam  Nursing note and vitals reviewed. Constitutional: She is oriented to person, place, and time. She appears well-developed and well-nourished. No distress.  HENT:  Head: Normocephalic and  atraumatic.  Eyes: Conjunctivae and EOM are normal. Pupils are equal, round, and reactive to light.  Neck: Normal range of motion.  Cardiovascular: Normal rate, regular rhythm and intact distal pulses.   Pulmonary/Chest: Effort normal and breath sounds normal. No respiratory distress. She exhibits no tenderness.  Musculoskeletal: Normal range of motion. She exhibits tenderness. She exhibits no edema.  Mild TTP of the lumbar to sacral areas on the right. SLR, pain radiation into upper right thigh, no radiation to knee or lower. Gait normal.  Neurological: She is alert and oriented to person, place, and time.  Knee reflex in R <<< L. Sensation and strength intact bilat.  Skin: Skin is warm and dry. No rash noted. She is not diaphoretic. No erythema. No pallor.  Psychiatric: She has a normal mood and affect. Her behavior is normal. Judgment and thought content normal.     Lab Results  Component Value Date   WBC 9.0 08/24/2012   HGB 11.6* 01/07/2014   HCT 31.5* 08/24/2012   PLT 186 08/24/2012   GLUCOSE 89 02/10/2014   CHOL 231* 11/27/2010   TRIG 41.0 11/27/2010   HDL 101.80 11/27/2010   LDLDIRECT 110.1 11/27/2010   LDLCALC 110* 04/09/2007   ALT 14 08/22/2012   AST 22 08/22/2012   NA 132* 02/10/2014   K 4.9 02/10/2014   CL 98 02/10/2014   CREATININE 0.8 02/10/2014   BUN 16 02/10/2014   CO2 28 02/10/2014   TSH 1.11 11/11/2011   INR 0.92 08/22/2012        Assessment & Plan:  Cecille was seen today for back pain.  Diagnoses and associated orders for this visit:  Acute back pain Comments: ?Radicular. Trial of prednisone, ref to sports medicine for eval. - predniSONE (DELTASONE) 20 MG tablet; Take 1 tablet (20 mg total) by mouth 2 (two) times daily with a meal. - Ambulatory referral to Sports Medicine    Return precautions provided, and patient handout on back pain.  Plan to follow up as needed, or for worsening or persistent symptoms despite treatment.  Patient Instructions    Prednisone twice daily for 1 week, be sure to take each dose with a meal to prevent nausea.  You will be called to set up an appointment with sports medicine to evaluate your pain and determine the need for more advanced imaging.  Continue alternation between ice and heat.  If emergency symptoms discussed during visit developed, seek medical attention immediately.  Followup as needed, or for worsening or persistent symptoms despite treatment.

## 2014-06-02 NOTE — Patient Instructions (Addendum)
Prednisone twice daily for 1 week, be sure to take each dose with a meal to prevent nausea.  You will be called to set up an appointment with sports medicine to evaluate your pain and determine the need for more advanced imaging.  Continue alternation between ice and heat.  If emergency symptoms discussed during visit developed, seek medical attention immediately.  Followup as needed, or for worsening or persistent symptoms despite treatment.    Back Pain, Adult Back pain is very common. The pain often gets better over time. The cause of back pain is usually not dangerous. Most people can learn to manage their back pain on their own.  HOME CARE   Stay active. Start with short walks on flat ground if you can. Try to walk farther each day.  Do not sit, drive, or stand in one place for more than 30 minutes. Do not stay in bed.  Do not avoid exercise or work. Activity can help your back heal faster.  Be careful when you bend or lift an object. Bend at your knees, keep the object close to you, and do not twist.  Sleep on a firm mattress. Lie on your side, and bend your knees. If you lie on your back, put a pillow under your knees.  Only take medicines as told by your doctor.  Put ice on the injured area.  Put ice in a plastic bag.  Place a towel between your skin and the bag.  Leave the ice on for 15-20 minutes, 03-04 times a day for the first 2 to 3 days. After that, you can switch between ice and heat packs.  Ask your doctor about back exercises or massage.  Avoid feeling anxious or stressed. Find good ways to deal with stress, such as exercise. GET HELP RIGHT AWAY IF:   Your pain does not go away with rest or medicine.  Your pain does not go away in 1 week.  You have new problems.  You do not feel well.  The pain spreads into your legs.  You cannot control when you poop (bowel movement) or pee (urinate).  Your arms or legs feel weak or lose feeling  (numbness).  You feel sick to your stomach (nauseous) or throw up (vomit).  You have belly (abdominal) pain.  You feel like you may pass out (faint). MAKE SURE YOU:   Understand these instructions.  Will watch your condition.  Will get help right away if you are not doing well or get worse. Document Released: 02/05/2008 Document Revised: 11/11/2011 Document Reviewed: 12/21/2013 [facility] Patient Information 2015 [place], [place]. This information is not intended to replace advice given to you by your health care provider. Make sure you discuss any questions you have with your health care provider. Lumbosacral Strain Lumbosacral strain is a strain of any of the parts that make up your lumbosacral vertebrae. Your lumbosacral vertebrae are the bones that make up the lower third of your backbone. Your lumbosacral vertebrae are held together by muscles and tough, fibrous tissue (ligaments).  CAUSES  A sudden blow to your back can cause lumbosacral strain. Also, anything that causes an excessive stretch of the muscles in the low back can cause this strain. This is typically seen when people exert themselves strenuously, fall, lift heavy objects, bend, or crouch repeatedly. RISK FACTORS  Physically demanding work.  Participation in pushing or pulling sports or sports that require a sudden twist of the back (tennis, golf, baseball).  Weight lifting.  Excessive  lower back curvature.  Forward-tilted pelvis.  Weak back or abdominal muscles or both.  Tight hamstrings. SIGNS AND SYMPTOMS  Lumbosacral strain may cause pain in the area of your injury or pain that moves (radiates) down your leg.  DIAGNOSIS Your health care provider can often diagnose lumbosacral strain through a physical exam. In some cases, you may need tests such as X-ray exams.  TREATMENT  Treatment for your lower back injury depends on many factors that your clinician will have to evaluate. However, most treatment will  include the use of anti-inflammatory medicines. HOME CARE INSTRUCTIONS   Avoid hard physical activities (tennis, racquetball, waterskiing) if you are not in proper physical condition for it. This may aggravate or create problems.  If you have a back problem, avoid sports requiring sudden body movements. Swimming and walking are generally safer activities.  Maintain good posture.  Maintain a healthy weight.  For acute conditions, you may put ice on the injured area.  Put ice in a plastic bag.  Place a towel between your skin and the bag.  Leave the ice on for 20 minutes, 2-3 times a day.  When the low back starts healing, stretching and strengthening exercises may be recommended. SEEK MEDICAL CARE IF:  Your back pain is getting worse.  You experience severe back pain not relieved with medicines. SEEK IMMEDIATE MEDICAL CARE IF:   You have numbness, tingling, weakness, or problems with the use of your arms or legs.  There is a change in bowel or bladder control.  You have increasing pain in any area of the body, including your belly (abdomen).  You notice shortness of breath, dizziness, or feel faint.  You feel sick to your stomach (nauseous), are throwing up (vomiting), or become sweaty.  You notice discoloration of your toes or legs, or your feet get very cold. MAKE SURE YOU:   Understand these instructions.  Will watch your condition.  Will get help right away if you are not doing well or get worse. Document Released: 05/29/2005 Document Revised: 08/24/2013 Document Reviewed: 04/07/2013 [facility] Patient Information 2015 [place], [place]. This information is not intended to replace advice given to you by your health care provider. Make sure you discuss any questions you have with your health care provider.

## 2014-06-02 NOTE — Progress Notes (Signed)
Pre visit review using our clinic review tool, if applicable. No additional management support is needed unless otherwise documented below in the visit note. 

## 2014-06-06 ENCOUNTER — Encounter: Payer: Self-pay | Admitting: Family Medicine

## 2014-06-06 ENCOUNTER — Ambulatory Visit (INDEPENDENT_AMBULATORY_CARE_PROVIDER_SITE_OTHER): Payer: Medicare FFS | Admitting: Family Medicine

## 2014-06-06 ENCOUNTER — Other Ambulatory Visit (INDEPENDENT_AMBULATORY_CARE_PROVIDER_SITE_OTHER): Payer: Medicare FFS

## 2014-06-06 VITALS — BP 118/72 | HR 84 | Ht 61.0 in | Wt 101.0 lb

## 2014-06-06 DIAGNOSIS — M5416 Radiculopathy, lumbar region: Secondary | ICD-10-CM

## 2014-06-06 DIAGNOSIS — M461 Sacroiliitis, not elsewhere classified: Secondary | ICD-10-CM

## 2014-06-06 NOTE — Patient Instructions (Signed)
Good to meet you Ice 20 minutes 2 times daily. Usually after activity and before bed. Exercises 3 times a week.  Start the exercises again in 48 hours.  In 2 days can start walking again for exercise but at 50 and increase slowly at 10% a day.  Pennsaid twice daily for 5 days Prednisone 40mg  for 1 more day B12 1018mcg daily B6 200mg  daily.  Saffron 300mg  daily.  Vitamin D 5000 IU daily.  Come back in 1 week and if not perfect we will get MRI.

## 2014-06-06 NOTE — Progress Notes (Signed)
[facility] [place], [place] [address] Phone: [phone] Subjective:    I'm seeing this patient by the request  of:  Berline Lopes  CC: Acute low back pain.  [id number] Kaitlyn Flowers is a 73 y.o. female coming in with complaint of acute low back pain. Patient was seen 4 days ago by another provider. Patient has had pain for 2 weeks and seems to be getting worse. Patient does have a history of low back pain as well as a meningioma that had a recurrence in removal in 2013. Patient states that this is significantly different. Patient is having radicular symptoms going down the posterior lateral aspect of her leg but not going past her knee. Denies any weakness or numbness. Denies any bowel or bladder incontinence. Patient states though that the pain seems to be localized mostly in the back and has 9/10 in severity. She describes it mostly is an aching and burning. Patient states that the pain is bad at night as well. Not responding to chiropractic therapy or anti-inflammatories or other modalities she's tried in the past.   X-rays were reviewed by me and shows the patient does have new degenerative changes at L3-L4, L4-L5, and L5-S1. Patient does have some foraminal narrowing on the right L5-S1.    Past medical history, social, surgical and family history all reviewed in electronic medical record.   Review of Systems: No headache, visual changes, nausea, vomiting, diarrhea, constipation, dizziness, abdominal pain, skin rash, fevers, chills, night sweats, weight loss, swollen lymph nodes, body aches, joint swelling, muscle aches, chest pain, shortness of breath, mood changes.   Objective Blood pressure 118/72, pulse 84, height 5\' 1"  (1.549 m), weight 101 lb (45.813 kg), SpO2 99.00%.  General: No apparent distress alert and oriented x3 mood and affect normal, dressed appropriately.  HEENT: Pupils equal, extraocular movements intact  Respiratory:  Patient's speak in full sentences and does not appear short of breath  Cardiovascular: No lower extremity edema, non tender, no erythema  Skin: Warm dry intact with no signs of infection or rash on extremities or on axial skeleton.  Abdomen: Soft nontender  Neuro: Cranial nerves II through XII are intact, neurovascularly intact in all extremities with 2+ DTRs and 2+ pulses.  Lymph: No lymphadenopathy of posterior or anterior cervical chain or axillae bilaterally.  Gait normal with good balance and coordination.  MSK:  Non tender with full range of motion and good stability and symmetric strength and tone of shoulders, elbows, wrist, hip, knee and ankles bilaterally.  Back Exam:  Inspection: Unremarkable  Motion: Flexion 45 deg, Extension 25 deg, Side Bending to 45 deg bilaterally,  Rotation to 45 deg bilaterally  SLR laying: Negative  XSLR laying: Negative  Palpable tenderness: Really tender over the right SI joint FABER: Positive right Sensory change: Gross sensation intact to all lumbar and sacral dermatomes.  Reflexes: 2+ at both patellar tendons, 2+ at achilles tendons, Babinski's downgoing.  Strength at foot  Plantar-flexion: 5/5 Dorsi-flexion: 5/5 Eversion: 5/5 Inversion: 5/5  Leg strength  Quad: 5/5 Hamstring: 5/5 Hip flexor: 5/5 Hip abductors: 4/5   Procedure: Real-time Ultrasound Guided Injection of right SI joint Device: GE Logiq E  Ultrasound guided injection is preferred based studies that show increased duration, increased effect, greater accuracy, decreased procedural pain, increased response rate, and decreased cost with ultrasound guided versus blind injection.  Verbal informed consent obtained.  Time-out conducted.  Noted no overlying erythema, induration, or other signs of local infection.  Skin prepped in a sterile fashion.  Local anesthesia: Topical Ethyl chloride.  With sterile technique and under real time ultrasound guidance:  A 21-gauge 2 inch needle was  injected with 2 cc of 0.5% Marcaine and 1 cc of Kenalog 40 mg/dL. Completed without difficulty  Pain immediately resolved suggesting accurate placement of the medication.  Advised to call if fevers/chills, erythema, induration, drainage, or persistent bleeding.  Images permanently stored and available for review in the ultrasound unit.  Impression: Technically successful ultrasound guided injection.     Impression and Recommendations:     This case required medical decision making of moderate complexity.

## 2014-06-06 NOTE — Assessment & Plan Note (Signed)
Patient does have some sacroiliitis I think and does have some inflammation seen on ultrasound. Patient did respond very well to the injection today. Patient did have some mild improvement of pain almost immediately. I am somewhat concerned patient's past medical history significant for meningioma but she is status post radiation as well as laminectomy at T12-L1. This would not give her the symptoms she is having at this time. Patient's x-ray showing the L5-S1 foraminal narrowing could be giving her the radicular symptoms. We are going to continue to monitor closely. Patient was given home exercises and did respond very well to the injection as stated above. Patient will try a topical anti-inflammatory as well. The patient will start on her Lyrica again if this is any neuropathy. Patient will call if any weakness or numbness occurs we will get further imaging immediately including an MRI of the lumbar spine. Patient otherwise will come back in one week for further evaluation and treatment.

## 2014-06-07 ENCOUNTER — Ambulatory Visit: Payer: Medicare FFS | Admitting: Family Medicine

## 2014-06-08 ENCOUNTER — Telehealth: Payer: Self-pay | Admitting: Internal Medicine

## 2014-06-08 NOTE — Telephone Encounter (Signed)
Patient would like for you to give her a call to discuss her pain because it's not getting much better.

## 2014-06-08 NOTE — Telephone Encounter (Signed)
Left detailed msg on pt's vmail.  

## 2014-06-08 NOTE — Telephone Encounter (Signed)
Patient was seen 2 days ago and needs to give it more time.

## 2014-06-10 ENCOUNTER — Ambulatory Visit (INDEPENDENT_AMBULATORY_CARE_PROVIDER_SITE_OTHER): Payer: Medicare FFS | Admitting: Family Medicine

## 2014-06-10 ENCOUNTER — Encounter: Payer: Self-pay | Admitting: Family Medicine

## 2014-06-10 VITALS — BP 90/58 | HR 103 | Ht 61.0 in | Wt 99.0 lb

## 2014-06-10 DIAGNOSIS — M5416 Radiculopathy, lumbar region: Secondary | ICD-10-CM

## 2014-06-10 MED ORDER — OXYCODONE-ACETAMINOPHEN 10-325 MG PO TABS: 1.0000 | ORAL_TABLET | Freq: Three times a day (TID) | ORAL | Status: DC | PRN

## 2014-06-10 NOTE — Progress Notes (Signed)
  [name] [place], [place] [address] Phone: [phone] Subjective:    CC: Acute low back pain followup.  [id number] Kaitlyn Flowers is a 73 y.o. female coming in with complaint of acute low back pain. Patient was seen 4 days ago by another provider. Patient has had pain for 2 weeks and seems to be getting worse. Patient was seen previously and had more of a sacroiliac joint dysfunction. Patient was given an injection and had only hours of relief. Patient states unfortunately now the pain has come back and seems to be getting worse. Still states that it seems to be localized is some mild radiation going down the posterior aspect of her leg but not going towards her knee. Denies any weakness, denies any fevers chills or any abnormal weight loss.   X-rays were reviewed by me a previously and patient does have new degenerative changes at L3-L4, L4-L5, and L5-S1. Patient does have some foraminal narrowing on the right L5-S1.    Past medical history, social, surgical and family history all reviewed in electronic medical record.   Review of Systems: No headache, visual changes, nausea, vomiting, diarrhea, constipation, dizziness, abdominal pain, skin rash, fevers, chills, night sweats, weight loss, swollen lymph nodes, body aches, joint swelling, muscle aches, chest pain, shortness of breath, mood changes.   Objective Blood pressure 90/58, pulse 103, height 5\' 1"  (1.549 m), weight 99 lb (44.906 kg), SpO2 95.00%.  General: No apparent distress alert and oriented x3 mood and affect normal, dressed appropriately.  HEENT: Pupils equal, extraocular movements intact  Respiratory: Patient's speak in full sentences and does not appear short of breath  Cardiovascular: No lower extremity edema, non tender, no erythema  Skin: Warm dry intact with no signs of infection or rash on extremities or on axial skeleton.  Abdomen: Soft nontender  Neuro: Cranial  nerves II through XII are intact, neurovascularly intact in all extremities with 2+ DTRs and 2+ pulses.  Lymph: No lymphadenopathy of posterior or anterior cervical chain or axillae bilaterally.  Gait normal with good balance and coordination.  MSK:  Non tender with full range of motion and good stability and symmetric strength and tone of shoulders, elbows, wrist, hip, knee and ankles bilaterally.  Back Exam:  Inspection: Unremarkable  Motion: Flexion 45 deg, Extension 25 deg, Side Bending to 45 deg bilaterally,  Rotation to 45 deg bilaterally  SLR laying: Negative  XSLR laying: Negative  Palpable tenderness: Really tender over the right SI joint FABER: Positive right Sensory change: Gross sensation intact to all lumbar and sacral dermatomes.  Reflexes: 2+ at both patellar tendons, 2+ at achilles tendons, Babinski's downgoing.  Strength at foot  Plantar-flexion: 5/5 Dorsi-flexion: 5/5 Eversion: 5/5 Inversion: 5/5  Leg strength  Quad: 5/5 Hamstring: 5/5 Hip flexor: 5/5 Hip abductors: 4/5       Impression and Recommendations:     This case required medical decision making of moderate complexity.

## 2014-06-10 NOTE — Assessment & Plan Note (Addendum)
Patient continues to have pain at this time. Patient does have L5-S1 type symptoms that could be contributing. Patient's x-rays previous showed foraminal narrowing at L5-S1. Patient not improving after the sacroiliac joint injection I do think that further imaging is necessary. I am looking for possibly I nerve root impingement on the right side that could be contributing. We ordered an MRI today. Patient will continue with the medication and was given pain medication secondary to the amount of pain she is having. She knows that this will not be a chronic prescription. Patient will continue the other modalities. We will discuss MRI findings with patient and but will be the next step including the possibility of epidural steroid injections or surgical intervention if necessary. Patient does have a past medical history significant for meningioma as well.  Spent greater than 25 minutes with patient face-to-face and had greater than 50% of counseling including as described above in assessment and plan.

## 2014-06-10 NOTE — Patient Instructions (Signed)
Good to se eyou Hold on the exercises MRI ordered.  Duexis 2 times daily Pain med when you need it.  I will write you when I get the MRI and discuss the next step.

## 2014-06-13 ENCOUNTER — Encounter: Payer: Medicare FFS | Admitting: Internal Medicine

## 2014-06-13 NOTE — Progress Notes (Signed)
Document opened and reviewed for OV medicare wellness  but appt  canceled same day .

## 2014-06-14 ENCOUNTER — Ambulatory Visit: Payer: Medicare FFS | Admitting: Family Medicine

## 2014-06-14 ENCOUNTER — Telehealth: Payer: Self-pay | Admitting: Internal Medicine

## 2014-06-14 MED ORDER — OXYCODONE-ACETAMINOPHEN 5-325 MG PO TABS: 1.0000 | ORAL_TABLET | Freq: Three times a day (TID) | ORAL | Status: DC | PRN

## 2014-06-14 NOTE — Telephone Encounter (Signed)
Patient said the oxyCODONE-acetaminophen you prescribed is making her sick and would like for you to prescribe something different.  She said another doctor in the past prescribed ENDOCET 5-325 tablets and that did not make her sick. She was actually throwing up as she was talking to me.

## 2014-06-14 NOTE — Telephone Encounter (Signed)
Patient said the

## 2014-06-14 NOTE — Telephone Encounter (Signed)
That is exactly the same medication.  We can try name brand if she would like

## 2014-06-14 NOTE — Telephone Encounter (Signed)
rx printed, pt made aware.

## 2014-06-17 ENCOUNTER — Other Ambulatory Visit: Payer: Self-pay

## 2014-06-17 ENCOUNTER — Telehealth: Payer: Self-pay | Admitting: Family Medicine

## 2014-06-17 ENCOUNTER — Ambulatory Visit
Admission: RE | Admit: 2014-06-17 | Discharge: 2014-06-17 | Disposition: A | Discharge status: Home / Self Care | Payer: Medicare FFS | Source: Ambulatory Visit | Attending: Family Medicine | Admitting: Family Medicine

## 2014-06-17 DIAGNOSIS — M5416 Radiculopathy, lumbar region: Secondary | ICD-10-CM

## 2014-06-17 NOTE — Telephone Encounter (Signed)
Patient states she had MRI today

## 2014-06-17 NOTE — Telephone Encounter (Signed)
Noted. Dr. Tamala Julian has it.

## 2014-06-19 ENCOUNTER — Other Ambulatory Visit: Payer: Medicare FFS

## 2014-06-20 ENCOUNTER — Encounter: Payer: Self-pay | Admitting: Internal Medicine

## 2014-06-20 ENCOUNTER — Telehealth: Payer: Self-pay | Admitting: Family Medicine

## 2014-06-20 DIAGNOSIS — M5416 Radiculopathy, lumbar region: Secondary | ICD-10-CM

## 2014-06-20 NOTE — Telephone Encounter (Signed)
Left msg letting pt know that dr. Tamala Flowers released results to [facility] on Friday & if she has any questions to call back.

## 2014-06-20 NOTE — Telephone Encounter (Signed)
Patient would like to know the results of her MRI

## 2014-06-20 NOTE — Telephone Encounter (Signed)
Spoke to pt, entered epidural injection order.

## 2014-06-20 NOTE — Telephone Encounter (Signed)
Please tell her I released them in her chart on Friday.   Multiple levels of some nerve compression. Do feel an epidural could help if she would like to try.  This will decrease inflammation and likely pain.

## 2014-06-27 ENCOUNTER — Ambulatory Visit
Admission: RE | Admit: 2014-06-27 | Discharge: 2014-06-27 | Disposition: A | Discharge status: Home / Self Care | Payer: Medicare FFS | Source: Ambulatory Visit | Attending: Family Medicine | Admitting: Family Medicine

## 2014-06-27 VITALS — BP 108/66 | HR 75

## 2014-06-27 DIAGNOSIS — M5416 Radiculopathy, lumbar region: Secondary | ICD-10-CM

## 2014-06-27 MED ORDER — IOHEXOL 180 MG/ML  SOLN
1.0000 mL | Freq: Once | INTRAMUSCULAR | Status: AC | PRN
  Administered 2014-06-27: 1 mL via EPIDURAL

## 2014-06-27 MED ORDER — METHYLPREDNISOLONE ACETATE 40 MG/ML INJ SUSP (RADIOLOG
120.0000 mg | Freq: Once | INTRAMUSCULAR | Status: AC
  Administered 2014-06-27: 120 mg via EPIDURAL

## 2014-06-27 NOTE — Discharge Instructions (Signed)

## 2014-07-11 ENCOUNTER — Ambulatory Visit (INDEPENDENT_AMBULATORY_CARE_PROVIDER_SITE_OTHER): Payer: Medicare FFS | Admitting: Family Medicine

## 2014-07-11 ENCOUNTER — Encounter: Payer: Self-pay | Admitting: Family Medicine

## 2014-07-11 ENCOUNTER — Other Ambulatory Visit (INDEPENDENT_AMBULATORY_CARE_PROVIDER_SITE_OTHER): Payer: Medicare FFS

## 2014-07-11 VITALS — BP 108/62 | HR 82 | Ht 61.0 in | Wt 100.0 lb

## 2014-07-11 DIAGNOSIS — M6289 Other specified disorders of muscle: Secondary | ICD-10-CM

## 2014-07-11 DIAGNOSIS — M549 Dorsalgia, unspecified: Secondary | ICD-10-CM

## 2014-07-11 LAB — COMPREHENSIVE METABOLIC PANEL
ALT: 19 U/L (ref 0–35)
AST: 21 U/L (ref 0–37)
Albumin: 3.8 g/dL (ref 3.5–5.2)
Alkaline Phosphatase: 51 U/L (ref 39–117)
BUN: 16 mg/dL (ref 6–23)
CO2: 24 mEq/L (ref 19–32)
Calcium: 10 mg/dL (ref 8.4–10.5)
Chloride: 97 mEq/L (ref 96–112)
Creatinine, Ser: 0.8 mg/dL (ref 0.4–1.2)
GFR: 77.11 mL/min (ref >60.00)
Glucose, Bld: 96 mg/dL (ref 70–99)
Potassium: 4.1 mEq/L (ref 3.5–5.1)
Sodium: 135 mEq/L (ref 135–145)
Total Bilirubin: 0.8 mg/dL (ref 0.2–1.2)
Total Protein: 7.3 g/dL (ref 6.0–8.3)

## 2014-07-11 LAB — CK: Total CK: 76 U/L (ref 7–177)

## 2014-07-11 LAB — CBC WITH DIFFERENTIAL/PLATELET
Basophils Absolute: 0 10*3/uL (ref 0.0–0.1)
Basophils Relative: 0.1 % (ref 0.0–3.0)
Eosinophils Absolute: 0.2 10*3/uL (ref 0.0–0.7)
Eosinophils Relative: 1.6 % (ref 0.0–5.0)
HCT: 36.8 % (ref 36.0–46.0)
Hemoglobin: 12.6 g/dL (ref 12.0–15.0)
Lymphocytes Relative: 16.1 % (ref 12.0–46.0)
Lymphs Abs: 1.7 10*3/uL (ref 0.7–4.0)
MCHC: 34.3 g/dL (ref 30.0–36.0)
MCV: 98.7 fl (ref 78.0–100.0)
Monocytes Absolute: 0.7 10*3/uL (ref 0.1–1.0)
Monocytes Relative: 6.4 % (ref 3.0–12.0)
Neutro Abs: 8.2 10*3/uL — ABNORMAL HIGH (ref 1.4–7.7)
Neutrophils Relative %: 75.8 % (ref 43.0–77.0)
Platelets: 273 10*3/uL (ref 150.0–400.0)
RBC: 3.73 Mil/uL — ABNORMAL LOW (ref 3.87–5.11)
RDW: 14.4 % (ref 11.5–15.5)
WBC: 10.8 10*3/uL — ABNORMAL HIGH (ref 4.0–10.5)

## 2014-07-11 LAB — T4, FREE: Free T4: 1.15 ng/dL (ref 0.60–1.60)

## 2014-07-11 LAB — T3, FREE: T3, Free: 3 pg/mL (ref 2.3–4.2)

## 2014-07-11 LAB — TSH: TSH: 1.21 u[IU]/mL (ref 0.35–4.50)

## 2014-07-11 LAB — CALCIUM, IONIZED: Calcium, Ion: 1.3 mmol/L (ref 1.12–1.32)

## 2014-07-11 LAB — SEDIMENTATION RATE: Sed Rate: 25 mm/hr — ABNORMAL HIGH (ref 0–22)

## 2014-07-11 MED ORDER — BACLOFEN 10 MG PO TABS: 10.0000 mg | ORAL_TABLET | Freq: Three times a day (TID) | ORAL | Status: DC

## 2014-07-11 MED ORDER — PREGABALIN 50 MG PO CAPS: 50.0000 mg | ORAL_CAPSULE | Freq: Three times a day (TID) | ORAL | Status: DC

## 2014-07-11 NOTE — Assessment & Plan Note (Signed)
Patient did not respond to an epidural steroid injection. Patient has had the previous history at the thoracic spinal cord tumor but the MRI does not show any significant problems. I do believe that patient's pain is now likely more musculoskeletal in nature. We are going to get labs to rule out other pathology including hypercalcemia, thyroid, anemia that could all be causing some discomfort. Patient has had some weight loss over the course of time here. Depending on the labs this might change some of her management. We discussed the possibility of formal physical therapy which patient did not respond well to in the past. Encourage patient to continue the vitamin D supplementation and patient was started on Lyrica to help with some of the and pain if it is more of a nerve or possibly chronic pain versus fibromyalgia. If muscle patient was given more musculoskeletal relaxer such as baclofen to try. We discussed that this could be secondary to muscle breakdown and patient will do some protein supplementation. Patient is going to follow-up in 2-3 weeks to make sure she improve.  Spent greater than 25 minutes with patient face-to-face and had greater than 50% of counseling including as described above in assessment and plan.

## 2014-07-11 NOTE — Progress Notes (Signed)
  [name] [address], [place] [address] Phone: [phone] Subjective:    CC: Acute low back pain followup.  [id number] Kaitlyn Flowers is a 73 y.o. female coming in with complaint of acute low back pain. Patient continued to have significant amount of pain and was even having radicular symptoms. Patient was found to have some mild weakness to further imaging was ordered.findings are described below. Patient then on of October 26th did have an epidural steroid injection.  Patient states she continues to have a significant amount of pain. Only time patient does feel better is when she was taking high-dose prednisone. Patient did have the epidural and states that unfortunately she did not have any improvement even from minutes after the injection.patient states that the pain seems to be going down her legs bilaterally mostly in the anterior aspect all the way to her feet now.   X-rays were reviewed by me a previously and patient does have new degenerative changes at L3-L4, L4-L5, and L5-S1. Patient does have some foraminal narrowing on the right L5-S1. MRI on 10/16/2015showed multiple levels of foraminal narrowing especially at the L4-L5 on the left side and L5-S1 on the left side as well. Moderate spinal stenosis noted.    Past medical history, social, surgical and family history all reviewed in electronic medical record.   Review of Systems: No headache, visual changes, nausea, vomiting, diarrhea, constipation, dizziness, abdominal pain, skin rash, fevers, chills, night sweats, weight loss, swollen lymph nodes, body aches, joint swelling, muscle aches, chest pain, shortness of breath, mood changes.   Objective Blood pressure 108/62, pulse 82, height 5\' 1"  (1.549 m), weight 100 lb (45.36 kg), SpO2 99 %.  General: No apparent distress alert and oriented x3 mood and affect normal, dressed appropriately.  HEENT: Pupils equal, extraocular movements  intact  Respiratory: Patient's speak in full sentences and does not appear short of breath  Cardiovascular: No lower extremity edema, non tender, no erythema  Skin: Warm dry intact with no signs of infection or rash on extremities or on axial skeleton.  Abdomen: Soft nontender  Neuro: Cranial nerves II through XII are intact, neurovascularly intact in all extremities with 2+ DTRs and 2+ pulses.  Lymph: No lymphadenopathy of posterior or anterior cervical chain or axillae bilaterally.  Gait normal with good balance and coordination.  MSK:  Non tender with full range of motion and good stability and symmetric strength and tone of shoulders, elbows, wrist, hip, knee and ankles bilaterally.  Back Exam:  Inspection: Unremarkable  Motion: Flexion 45 deg, Extension 25 deg, Side Bending to 45 deg bilaterally,  Rotation to 45 deg bilaterally  SLR laying: Negative  XSLR laying: Negative  Palpable tenderness: Really tender over the right SI joint FABER: Positive right Sensory change: Gross sensation intact to all lumbar and sacral dermatomes.  Reflexes: 2+ at both patellar tendons, 2+ at achilles tendons, Babinski's downgoing.  Strength at foot  Plantar-flexion: 5/5 Dorsi-flexion: 5/5 Eversion: 5/5 Inversion: 5/5  Leg strength  Quad: 5/5 Hamstring: 5/5 Hip flexor: 5/5 Hip abductors: 4/5   Wt Readings from Last 3 Encounters:  07/11/14 100 lb (45.36 kg)  06/10/14 99 lb (44.906 kg)  06/06/14 101 lb (45.813 kg)       Impression and Recommendations:     This case required medical decision making of moderate complexity.

## 2014-07-11 NOTE — Patient Instructions (Addendum)
Good to see you Ice is still your friend.  Lyrica 50mg  3 times daily as needed.  We will get ABI Baclofen up to 3 times a day.  Ice is your friend.  Soy protein or pea protein supplement could help 2 times a day See me again in 2-3 weeks.

## 2014-07-12 LAB — PTH, INTACT AND CALCIUM
Calcium: 9.8 mg/dL (ref 8.4–10.5)
PTH: 66 pg/mL — ABNORMAL HIGH (ref 14–64)

## 2014-07-12 LAB — ANTI-NUCLEAR AB-TITER (ANA TITER): ANA Titer 1: NEGATIVE

## 2014-07-12 LAB — ANA: Anti Nuclear Antibody(ANA): POSITIVE — AB

## 2014-07-18 ENCOUNTER — Ambulatory Visit (INDEPENDENT_AMBULATORY_CARE_PROVIDER_SITE_OTHER): Payer: Medicare FFS | Admitting: Internal Medicine

## 2014-07-18 ENCOUNTER — Encounter: Payer: Self-pay | Admitting: Internal Medicine

## 2014-07-18 VITALS — BP 124/50 | Temp 98.0°F | Ht 61.0 in | Wt 101.9 lb

## 2014-07-18 DIAGNOSIS — Z23 Encounter for immunization: Secondary | ICD-10-CM

## 2014-07-18 DIAGNOSIS — Z8669 Personal history of other diseases of the nervous system and sense organs: Secondary | ICD-10-CM

## 2014-07-18 DIAGNOSIS — R200 Anesthesia of skin: Secondary | ICD-10-CM

## 2014-07-18 DIAGNOSIS — R208 Other disturbances of skin sensation: Secondary | ICD-10-CM

## 2014-07-18 DIAGNOSIS — Z86018 Personal history of other benign neoplasm: Secondary | ICD-10-CM

## 2014-07-18 DIAGNOSIS — M5416 Radiculopathy, lumbar region: Secondary | ICD-10-CM

## 2014-07-18 NOTE — Patient Instructions (Signed)
Can do referral  to neurologist discussed.   Agree with also  fu with dr Ellene Route.  And ask opinion. Water exercise sometimes can be helpful to maintain stamina.

## 2014-07-18 NOTE — Progress Notes (Signed)
Pre visit review using our clinic review tool, if applicable. No additional management support is needed unless otherwise documented below in the visit note.  Chief Complaint  Patient presents with  . Lower Rt side back pain    Ongoing for 2.5 years.  Pain is worsening.  . Rt side buttock pain    HPI: Kaitlyn Flowers 73 y.o. for problem  Acute visit but actually exacerbation an opinion about an ongoing problem  Has seen Dr Tamala Julian  Sports med  Most recently without resolution. Her pain is now more tolerable but chronic and it limits what she can do and is asking for further evaluation and opinion.ometimes foot gets numb.  No limping at this time sometimes  Both legs.  Get pain Laying flat  Ice  Help.  Last visit with [name] was a while back when she developed this pain after her thoracic back surgery.Martin Majestic to the pain clinic [facility] and had lyrica.   Se makes her fuzzy headed and limited help.   So ... Will  See elsner   In December.  Asking to see  Referral  [name] neurologist in [name] No falling or current weakness  ROS: See pertinent positives and negatives per HPI. No fever weight loss other systemic sx .   Past Medical History  Diagnosis Date  . Osteopenia   . 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  . Wears hearing aid   . PONV (postoperative nausea and vomiting)   . Anemia    Past Surgical History  Procedure Laterality Date  . Abdominal hysterectomy  1973    1/2 of ovary left cyst on ovary  . Shoulder rotator culff   2006  . Spinal cord tumor  1987    2012 october   . Appendectomy      removed with hysterectomy  . Rotator cuff repair      left   . Lumbar wound debridement  12/06/2011    Procedure: LUMBAR WOUND DEBRIDEMENT;  Surgeon: Kristeen Miss, MD;  Location: [place] [facility];  Service: Neurosurgery;  Laterality: N/A;  Repair of Pseudomeningocele Lumbar wound    Family History  Problem Relation Age of Onset  . Stroke Mother    2010  . Heart attack Father   . Anesthesia problems Neg Hx     History   Social History  . Marital Status: Legally Separated    Spouse Name: N/A    Number of Children: N/A  . Years of Education: N/A   Social History Main Topics  . Smoking status: Former Smoker -- 0.25 packs/day for 2 years    Types: Cigarettes    Quit date: 09/03/1967  . Smokeless tobacco: None  . Alcohol Use: 2.4 oz/week    4 Glasses of wine per week  . Drug Use: No  . Sexual Activity: None   Other Topics Concern  . 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 b complex vitamins capsule Take 1 capsule by mouth daily.  Carin Hock Iron (PERFECT IRON) 25 MG TABS Take 25 mg by mouth daily.  . Cholecalciferol (VITAMIN D3) 5000 UNITS CAPS Take 1 capsule by mouth daily.  . pregabalin (LYRICA) 50 MG capsule Take 50 mg by  mouth as needed.  . [DISCONTINUED] baclofen (LIORESAL) 10 MG tablet Take 1 tablet (10 mg total) by mouth 3 (three) times daily.  . [DISCONTINUED] oxyCODONE-acetaminophen (ENDOCET) 5-325 MG per tablet Take 1 tablet by mouth every 8 (eight) hours as needed for severe pain.  . [DISCONTINUED] predniSONE (DELTASONE) 20 MG tablet Take 1 tablet (20 mg total) by mouth 2 (two) times daily with a meal.  . [DISCONTINUED] pregabalin (LYRICA) 50 MG capsule Take 1 capsule (50 mg total) by mouth 3 (three) times daily.    EXAM:  BP 124/50 mmHg  Temp(Src) 98 F (36.7 C) (Oral)  Ht 5\' 1"  (1.549 m)  Wt 101 lb 14.4 oz (46.222 kg)  BMI 19.26 kg/m2  Body mass index is 19.26 kg/(m^2).  GENERAL: vitals reviewed and listed above, alert, oriented, appears well hydrated and in no acute distress  cap refill  MS: moves all extremities without noticeable focal  Abnormality gait looks good today toe heel  Walking 0 points to right hip  area  Buttocks area.  PSYCH: pleasant and cooperative, no obvious depression or anxiety Lab Results  Component Value Date   WBC 10.8* 07/11/2014   HGB 12.6 07/11/2014   HCT 36.8 07/11/2014   PLT 273.0 07/11/2014   GLUCOSE 96 07/11/2014   CHOL 231* 11/27/2010   TRIG 41.0 11/27/2010   HDL 101.80 11/27/2010   LDLDIRECT 110.1 11/27/2010   LDLCALC 110* 04/09/2007   ALT 19 07/11/2014   AST 21 07/11/2014   NA 135 07/11/2014   K 4.1 07/11/2014   CL 97 07/11/2014   CREATININE 0.8 07/11/2014   BUN 16 07/11/2014   CO2 24 07/11/2014   TSH 1.21 07/11/2014   INR 0.92 08/22/2012   Above labs were done after steroids  ASSESSMENT AND PLAN:  Discussed the following assessment and plan:  Need for prophylactic vaccination and inoculation against influenza - Plan: Flu vaccine HIGH DOSE PF (Fluzone Tri High dose)  Lumbar radicular pain - unresponsive to conservative measures. ocass bilateral numbness liuniting   Hx of benign neoplasm of spinal cord Can walk now up to 60 minutes before gets  Flared. Rest helps. But frustratingly limiting to her to have chronic pain  Asks about seeing neurologist   Will put in referral  -Patient advised to return or notify health care team  if symptoms worsen ,persist or new concerns arise.  Patient Instructions  Can do referral  to neurologist discussed.   Agree with also  fu with dr Ellene Route.  And ask opinion. Water exercise sometimes can be helpful to maintain stamina.    [name] M.D.

## 2014-07-25 ENCOUNTER — Ambulatory Visit (INDEPENDENT_AMBULATORY_CARE_PROVIDER_SITE_OTHER): Payer: Medicare FFS | Admitting: Family Medicine

## 2014-07-25 VITALS — BP 108/72 | HR 91 | Ht 61.0 in | Wt 100.0 lb

## 2014-07-25 DIAGNOSIS — M5416 Radiculopathy, lumbar region: Secondary | ICD-10-CM

## 2014-07-25 MED ORDER — IBUPROFEN-FAMOTIDINE 800-26.6 MG PO TABS: ORAL_TABLET | ORAL | Status: DC

## 2014-07-25 NOTE — Patient Instructions (Addendum)
Good to see you as always. For protein- Quinoa, beans, fish at least 3 times a week.  Pea protein is great 30-45 minutes before lunch and dinner, doing it before bed. After walking then need to eat within 30 minutes  Look up inflammatory foods.  Troughout the day no longer then 2 hours without eating.  Start with this and lets say 3 weeks If not better we will consider rheumatology or medicine.

## 2014-07-25 NOTE — Assessment & Plan Note (Signed)
I believe that patient's pain is multifactorial. Patient is significantly small and continues to have difficulty with gaining weight. I do think that there is some protein malnutrition that is contributing. We discussed different protein supplementation with patient being a mostly a vegetarian. We also discussed timing of different fluid semi-be beneficial. We discussed continuing the home exercises in the icing protocol. We discussed other over-the-counter medicines. Patient was given new cushion for anti-inflammatory. Patient and will come back in 3 weeks and see if a make any improvement. If continuing to have difficulty I would consider further evaluation by rheumatologist secondary to the positive ANA and see if there is anything else that could be contributing. Patient declined another round of formal physical therapy.  Spent greater than 25 minutes with patient face-to-face and had greater than 50% of counseling including as described above in assessment and plan.

## 2014-07-25 NOTE — Progress Notes (Signed)
[name] [address], [place] [address] Phone: [phone] Subjective:    CC: Acute low back pain followup.  [id number] Kaitlyn Flowers is a 73 y.o. female coming in with complaint of acute low back pain. Patient continued to have significant amount of pain and was even having radicular symptoms. Patient was found to have some mild weakness to further imaging was ordered.findings are described below. Patient then on of October 26th did have an epidural steroid injection.  Patient states she continues to have a significant amount of pain even after the injection. Patient is on Lyrica and was started on a new muscle relaxer. Patient states her back pain is not improved. Patient states that she is unable to tolerate the Lyrica on a regular basis. Patient states that the muscle relaxer baclofen also has been difficult. Patient still states that her only improvement has been with anti-inflammatories. States that the back pain does radiate down her legs occasionally. Describes it is more of a dull aching throbbing sensation. Can wake her up at night.  Patient at last visit did have labs drawn and did have a positive in ANA but a negative titer. Patient did have a minor elevation an ESR and a minor elevation and parathyroid hormone. Patient's calcium is borderline high. Please see labs for further information.  X-rays were reviewed by me a previously and patient does have new degenerative changes at L3-L4, L4-L5, and L5-S1. Patient does have some foraminal narrowing on the right L5-S1. MRI on 10/16/2015showed multiple levels of foraminal narrowing especially at the L4-L5 on the left side and L5-S1 on the left side as well. Moderate spinal stenosis noted.    Past medical history, social, surgical and family history all reviewed in electronic medical record.   Review of Systems: No headache, visual changes, nausea, vomiting, diarrhea, constipation, dizziness,  abdominal pain, skin rash, fevers, chills, night sweats, weight loss, swollen lymph nodes, body aches, joint swelling, muscle aches, chest pain, shortness of breath, mood changes.   Objective Blood pressure 108/72, pulse 91, height _0  (1.549 m), weight 100 lb (45.36 kg), SpO2 95 %.  General: No apparent distress alert and oriented x3 mood and affect normal, dressed appropriately.  HEENT: Pupils equal, extraocular movements intact  Respiratory: Patient's speak in full sentences and does not appear short of breath  Cardiovascular: No lower extremity edema, non tender, no erythema  Skin: Warm dry intact with no signs of infection or rash on extremities or on axial skeleton.  Abdomen: Soft nontender  Neuro: Cranial nerves II through XII are intact, neurovascularly intact in all extremities with 2+ DTRs and 2+ pulses.  Lymph: No lymphadenopathy of posterior or anterior cervical chain or axillae bilaterally.  Gait normal with good balance and coordination.  MSK:  Non tender with full range of motion and good stability and symmetric strength and tone of shoulders, elbows, wrist, hip, knee and ankles bilaterally.  Back Exam:  Inspection: Unremarkable  Motion: Flexion 45 deg, Extension 25 deg, Side Bending to 45 deg bilaterally,  Rotation to 45 deg bilaterally  SLR laying: Negative  XSLR laying: Negative  Palpable tenderness: Really tender over the right SI joint FABER: Positive right Sensory change: Gross sensation intact to all lumbar and sacral dermatomes.  Reflexes: 2+ at both patellar tendons, 2+ at achilles tendons, Babinski's downgoing.  Strength at foot  Plantar-flexion: 5/5 Dorsi-flexion: 5/5 Eversion: 5/5 Inversion: 5/5  Leg strength  Quad: 5/5 Hamstring: 5/5 Hip flexor: 5/5 Hip  abductors: 4/5   Wt Readings from Last 3 Encounters:  07/25/14 100 lb (45.36 kg)  07/18/14 101 lb 14.4 oz (46.222 kg)  07/11/14 100 lb (45.36 kg)       Impression and Recommendations:     This case  required medical decision making of moderate complexity.

## 2014-07-28 ENCOUNTER — Encounter: Payer: Self-pay | Admitting: Family Medicine

## 2014-07-30 ENCOUNTER — Encounter: Payer: Self-pay | Admitting: Family Medicine

## 2014-08-01 ENCOUNTER — Encounter (HOSPITAL_COMMUNITY): Payer: Self-pay

## 2014-08-01 ENCOUNTER — Encounter: Payer: Self-pay | Admitting: Family Medicine

## 2014-08-01 ENCOUNTER — Emergency Department (HOSPITAL_COMMUNITY)
Admission: EM | Admit: 2014-08-01 | Discharge: 2014-08-01 | Disposition: A | Discharge status: Home / Self Care | Payer: Medicare FFS | Source: Home / Self Care | Attending: Family Medicine | Admitting: Family Medicine

## 2014-08-01 DIAGNOSIS — R030 Elevated blood-pressure reading, without diagnosis of hypertension: Secondary | ICD-10-CM

## 2014-08-01 DIAGNOSIS — IMO0001 Reserved for inherently not codable concepts without codable children: Secondary | ICD-10-CM

## 2014-08-01 NOTE — ED Notes (Signed)
Was placed on a new medication for nerve pain by her PCP mid month, and since then has developed HA , which has been waking her up at ~4 am every morning, HA better w tylenol. NAD at present. Per patient and her partner, her BP is SIGNIFICANTLY higher than usual

## 2014-08-01 NOTE — ED Provider Notes (Signed)
CSN: [id number]     Arrival date & time 08/01/14  1744 History   First MD Initiated Contact with Patient 08/01/14 1753     Chief Complaint  Patient presents with  . Hypertension   (Consider location/radiation/quality/duration/timing/severity/associated sxs/prior Treatment) Patient is a 73 y.o. female presenting with hypertension. The history is provided by the patient.  Hypertension This is a new problem. The current episode started more than 2 days ago. The problem has not changed (fluctuating bp at home 130/72 this am.) since onset.Pertinent negatives include no headaches and no shortness of breath.    Past Medical History  Diagnosis Date  . Osteopenia   . 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  . Wears hearing aid   . PONV (postoperative nausea and vomiting)   . Anemia    Past Surgical History  Procedure Laterality Date  . Abdominal hysterectomy  1973    1/2 of ovary left cyst on ovary  . Shoulder rotator culff   2006  . Spinal cord tumor  1987    2012 october   . Appendectomy      removed with hysterectomy  . Rotator cuff repair      left   . Lumbar wound debridement  12/06/2011    Procedure: LUMBAR WOUND DEBRIDEMENT;  Surgeon: Kristeen Miss, MD;  Location: [facility];  Service: Neurosurgery;  Laterality: N/A;  Repair of Pseudomeningocele Lumbar wound   Family History  Problem Relation Age of Onset  . Stroke Mother     2010  . Heart attack Father   . Anesthesia problems Neg Hx    History  Substance Use Topics  . Smoking status: Former Smoker -- 0.25 packs/day for 2 years    Types: Cigarettes    Quit date: 09/03/1967  . Smokeless tobacco: Not on file  . Alcohol Use: 2.4 oz/week    4 Glasses of wine per week   OB History    No data available     Review of Systems  Constitutional: Negative.   Respiratory: Negative.  Negative for shortness of breath.   Cardiovascular: Negative.   Neurological: Negative for headaches.     Allergies  Penicillins  Home Medications   Prior to Admission medications   Medication Sig Start Date End Date Taking? Authorizing Provider  b complex vitamins capsule Take 1 capsule by mouth daily.    Historical Provider, MD  Carbonyl Iron (PERFECT IRON) 25 MG TABS Take 25 mg by mouth daily.    Historical Provider, MD  Cholecalciferol (VITAMIN D3) 5000 UNITS CAPS Take 1 capsule by mouth daily.    Historical Provider, MD  Ibuprofen-Famotidine 800-26.6 MG TABS Take 1 tablet 3 times a day as needed. 07/25/14   Lyndal Pulley, DO  pregabalin (LYRICA) 50 MG capsule Take 50 mg by mouth as needed.    Historical Provider, MD   BP 159/82 mmHg  Pulse 81  Temp(Src) 98.1 F (36.7 C) (Oral)  Resp 16  SpO2 100% Physical Exam  Constitutional: She is oriented to person, place, and time. She appears well-developed and well-nourished.  Cardiovascular: Normal heart sounds and intact distal pulses.   Pulmonary/Chest: Effort normal and breath sounds normal.  Musculoskeletal: She exhibits no edema.  Neurological: She is alert and oriented to person, place, and time.  Skin: Skin is warm and dry.  Nursing note and vitals reviewed.   ED Course  Procedures (including critical care time) Labs Review Labs Reviewed - No  data to display  Imaging Review No results found.   MDM   1. Healthy adult       Billy Fischer, MD 08/02/14 [id number]

## 2014-08-01 NOTE — Discharge Instructions (Signed)
Continue to monitor your blood pressure and discuss results with your doctor

## 2014-08-01 NOTE — Telephone Encounter (Signed)
Pt called in and said that her bp was 136/71 and she said for her that is really high.

## 2014-08-02 ENCOUNTER — Encounter: Payer: Self-pay | Admitting: Family Medicine

## 2014-08-06 ENCOUNTER — Encounter: Payer: Self-pay | Admitting: Family Medicine

## 2014-08-16 ENCOUNTER — Ambulatory Visit (INDEPENDENT_AMBULATORY_CARE_PROVIDER_SITE_OTHER): Payer: Medicare FFS | Admitting: Family Medicine

## 2014-08-16 ENCOUNTER — Encounter: Payer: Self-pay | Admitting: Family Medicine

## 2014-08-16 VITALS — BP 112/64 | HR 85 | Ht 61.0 in | Wt 105.0 lb

## 2014-08-16 DIAGNOSIS — M5416 Radiculopathy, lumbar region: Secondary | ICD-10-CM

## 2014-08-16 NOTE — Assessment & Plan Note (Signed)
Discuss again and her length. We discussed that patient is likely going to have some pain at baseline. We discussed continuing the over-the-counter medications and we discussed how scheduled Tylenol can be significantly effective. Discussed which activities to do and which activities to avoid. MRI did not show any new spinal cord tumors. Patient is even elected to discuss with a neurosurgeon again about the possibility of surgical intervention declined. Patient will continue with the conservative therapy at this time. Patient does take Lyrica on an as-needed basis. Patient did have side effects to the ibuprofen and instead we'll try more of some natural supplementations if possible. Patient did gain 5 pounds which I think will be helpful in encourage her to continue with the protein supplementation. Patient and will come back and see me again in 4-6 weeks for further evaluation and treatment.  Spent greater than 25 minutes with patient face-to-face and had greater than 50% of counseling including as described above in assessment and plan.

## 2014-08-16 NOTE — Progress Notes (Signed)
[name] [place], [place] [address] Phone: [phone] Subjective:    CC: Acute low back pain followup.  Kaitlyn Flowers:[id number] Kaitlyn Flowers is a 73 y.o. female coming in with complaint of acute low back pain.patient does have radicular symptoms. Cc previous notes for further information on everything that has been tried including an epidural steroid injection with no significant improvement. Patient has been doing conservative therapy but anti-inflammatories because patient have an elevated blood pressure and discontinued the anti-inflammatories which seem to help with the headaches as well as the higher blood pressure. Patient states since stopping this it has gotten significantly better. Patient states overall she has more good days than bad days which is an improvement. Patient did elected discuss with the neurosurgeon and did not find any other solution.patient states he can still do most of her daily activities without any significant discomfort. Sometimes she can have pain that stops her from activity. Denies any significant nighttime awakening. Patient has started protein supplementation and has gained weight which has made her feel like she has some more energy.   More past medical history Patient at last visit did have labs drawn and did have a positive in ANA but a negative titer. Patient did have a minor elevation an ESR and a minor elevation and parathyroid hormone. Patient's calcium is borderline high. Please see labs for further information.  X-rays were reviewed by me a previously and patient does have new degenerative changes at L3-L4, L4-L5, and L5-S1. Patient does have some foraminal narrowing on the right L5-S1. MRI on 10/16/2015showed multiple levels of foraminal narrowing especially at the L4-L5 on the left side and L5-S1 on the left side as well. Moderate spinal stenosis noted. Patient had a epidural steroid injection 06/27/2014 with  no significant improvement.    Past medical history, social, surgical and family history all reviewed in electronic medical record.   Review of Systems: No headache, visual changes, nausea, vomiting, diarrhea, constipation, dizziness, abdominal pain, skin rash, fevers, chills, night sweats, weight loss, swollen lymph nodes, body aches, joint swelling, muscle aches, chest pain, shortness of breath, mood changes.   Objective Blood pressure 112/64, pulse 85, height _0  (1.549 m), weight 105 lb (47.628 kg), SpO2 91 %.  General: No apparent distress alert and oriented x3 mood and affect normal, dressed appropriately.  HEENT: Pupils equal, extraocular movements intact  Respiratory: Patient's speak in full sentences and does not appear short of breath  Cardiovascular: No lower extremity edema, non tender, no erythema  Skin: Warm dry intact with no signs of infection or rash on extremities or on axial skeleton.  Abdomen: Soft nontender  Neuro: Cranial nerves II through XII are intact, neurovascularly intact in all extremities with 2+ DTRs and 2+ pulses.  Lymph: No lymphadenopathy of posterior or anterior cervical chain or axillae bilaterally.  Gait normal with good balance and coordination.  MSK:  Non tender with full range of motion and good stability and symmetric strength and tone of shoulders, elbows, wrist, hip, knee and ankles bilaterally.  Back Exam:  Inspection: Unremarkable  Motion: Flexion 45 deg, Extension 25 deg, Side Bending to 45 deg bilaterally,  Rotation to 45 deg bilaterally  SLR laying: Negative  XSLR laying: Negative  Palpable tenderness: tender over the right SI jointbut significantly less than last exam FABER: Positive right Sensory change: Gross sensation intact to all lumbar and sacral dermatomes.  Reflexes: 2+ at both patellar tendons, 2+ at achilles tendons, Babinski's downgoing.  Strength at foot  Plantar-flexion: 5/5 Dorsi-flexion: 5/5 Eversion: 5/5 Inversion: 5/5    Leg strength  Quad: 5/5 Hamstring: 5/5 Hip flexor: 5/5 Hip abductors: 4/5  No significant radicular symptoms today. Minor improvement from previous exam.  Wt Readings from Last 3 Encounters:  08/16/14 105 lb (47.628 kg)  07/25/14 100 lb (45.36 kg)  07/18/14 101 lb 14.4 oz (46.222 kg)       Impression and Recommendations:     This case required medical decision making of moderate complexity.

## 2014-08-16 NOTE — Patient Instructions (Signed)
Keep up the good work Ice is your friend Tylenol 650mg  3 times a day would be reccommended.  Lyrica when you need it.  Continue the other vitamins Continue the exercises that feel good.  Check in in 6 weeks.

## 2014-09-27 ENCOUNTER — Encounter: Payer: Self-pay | Admitting: Family Medicine

## 2014-09-27 ENCOUNTER — Ambulatory Visit (INDEPENDENT_AMBULATORY_CARE_PROVIDER_SITE_OTHER): Payer: Medicare FFS | Admitting: Family Medicine

## 2014-09-27 VITALS — BP 102/70 | HR 84 | Ht 61.0 in | Wt 104.0 lb

## 2014-09-27 DIAGNOSIS — M549 Dorsalgia, unspecified: Secondary | ICD-10-CM

## 2014-09-27 DIAGNOSIS — G8929 Other chronic pain: Secondary | ICD-10-CM

## 2014-09-27 DIAGNOSIS — M5416 Radiculopathy, lumbar region: Secondary | ICD-10-CM

## 2014-09-27 NOTE — Progress Notes (Signed)
Pre visit review using our clinic review tool, if applicable. No additional management support is needed unless otherwise documented below in the visit note. 

## 2014-09-27 NOTE — Patient Instructions (Signed)
Good to see you I am so glad we are making pr0gress Dr. Vira Blanco will be seeing you.  Consider decreasing omega 6 foods.  I am here if you need me

## 2014-09-27 NOTE — Progress Notes (Signed)
[name] [place], [place] [address] Phone: [phone] Subjective:    CC: Acute low back pain followup.  NAT:[id number] Kaitlyn Flowers is a 74 y.o. female coming in with complaint of low back pain.patient does have radicular symptoms.  Patient has had this pain for quite some time. Patient has been doing the exercises as well as taking the natural vitamins and states that the radicular symptoms in the buttocks and down the leg has improved. Continuing to have the chronic aching pain in the back.  [facility]  and did have an evaluation by neurology who is concern that patient's previous surgeries likely are contributing with secondary to scar tissue formation. Patient states it is manageable book and not sit for long amount of time without any significant pain. Patient has student continue moving seems to be better.  More past medical history Patient at last visit did have labs drawn and did have a positive in ANA but a negative titer. Patient did have a minor elevation an ESR and a minor elevation and parathyroid hormone. Patient's calcium is borderline high. Please see labs for further information.  X-rays were reviewed by me a previously and patient does have new degenerative changes at L3-L4, L4-L5, and L5-S1. Patient does have some foraminal narrowing on the right L5-S1. MRI on 10/16/2015showed multiple levels of foraminal narrowing especially at the L4-L5 on the left side and L5-S1 on the left side as well. Moderate spinal stenosis noted. Patient had a epidural steroid injection 06/27/2014 with no significant improvement. History of removal of a spinal cord tumor.    Past medical history, social, surgical and family history all reviewed in electronic medical record.   Review of Systems: No headache, visual changes, nausea, vomiting, diarrhea, constipation, dizziness, abdominal pain, skin rash, fevers, chills, night sweats,  weight loss, swollen lymph nodes, body aches, joint swelling, muscle aches, chest pain, shortness of breath, mood changes.   Objective Blood pressure 102/70, pulse 84, height '5\' 1"'  (1.549 m), weight 104 lb (47.174 kg), SpO2 96 %.  General: No apparent distress alert and oriented x3 mood and affect normal, dressed appropriately.  HEENT: Pupils equal, extraocular movements intact  Respiratory: Patient's speak in full sentences and does not appear short of breath  Cardiovascular: No lower extremity edema, non tender, no erythema  Skin: Warm dry intact with no signs of infection or rash on extremities or on axial skeleton.  Abdomen: Soft nontender  Neuro: Cranial nerves II through XII are intact, neurovascularly intact in all extremities with 2+ DTRs and 2+ pulses.  Lymph: No lymphadenopathy of posterior or anterior cervical chain or axillae bilaterally.  Gait normal with good balance and coordination.  MSK:  Non tender with full range of motion and good stability and symmetric strength and tone of shoulders, elbows, wrist, hip, knee and ankles bilaterally.  Back Exam:  Inspection: Unremarkable  Motion: Flexion 45 deg, Extension 25 deg, Side Bending to 45 deg bilaterally,  Rotation to 45 deg bilaterally  SLR laying: Negative  XSLR laying: Negative  Palpable tenderness: Minimally tender today FABER: Positive right less than previous exam Sensory change: Gross sensation intact to all lumbar and sacral dermatomes.  Reflexes: 2+ at both patellar tendons, 2+ at achilles tendons, Babinski's downgoing.  Strength at foot  Plantar-flexion: 5/5 Dorsi-flexion: 5/5 Eversion: 5/5 Inversion: 5/5  Leg strength  Quad: 5/5 Hamstring: 5/5 Hip flexor: 5/5 Hip abductors: 4/5  No significant radicular symptoms today. Minor improvement  from previous exam.  Wt Readings from Last 3 Encounters:  09/27/14 104 lb (47.174 kg)  08/16/14 105 lb (47.628 kg)  07/25/14 100 lb (45.36 kg)       Impression and  Recommendations:     This case required medical decision making of moderate complexity.

## 2014-09-27 NOTE — Assessment & Plan Note (Signed)
Patient does have a difficult case. Patient has had surgery before and likely does have scar tissue formation and that could be concerning portion of the spinal stenosis. Patient's radicular symptoms has improved but patient continues to have the discomfort at all times. Patient is wondering if there is anything else that can be done. I do think that possibly referral to pain management for other modalities could be helpful. This was put in today and was actually suggested by patient. We discussed continuing the natural supplementation. Patient continues with the Lyrica but does not want to take it on a regular basis. I do think that there is a possibility for either radiofrequency ablation or other types of injections that could be beneficial to this patient. I do not believe she is looking for chronic pain medications. Patient knows I'm here if she has any other questions. Patient would like to avoid any other surgical intervention.  Spent  25 minutes with patient face-to-face and had greater than 50% of counseling including as described above in assessment and plan.

## 2014-09-28 ENCOUNTER — Encounter: Payer: Self-pay | Admitting: Family Medicine

## 2014-10-10 ENCOUNTER — Encounter: Payer: Self-pay | Admitting: Family Medicine

## 2015-01-13 ENCOUNTER — Telehealth: Payer: Self-pay | Admitting: *Deleted

## 2015-01-13 NOTE — Telephone Encounter (Signed)
mychart message to sent to pt to see if we can set up mammogram

## 2015-02-27 ENCOUNTER — Other Ambulatory Visit: Payer: Self-pay

## 2015-05-10 ENCOUNTER — Encounter: Payer: Self-pay | Admitting: Family Medicine

## 2015-05-10 ENCOUNTER — Ambulatory Visit (INDEPENDENT_AMBULATORY_CARE_PROVIDER_SITE_OTHER): Payer: Medicare PPO | Admitting: Family Medicine

## 2015-05-10 VITALS — BP 110/66 | HR 60 | Temp 98.1°F | Wt 103.0 lb

## 2015-05-10 DIAGNOSIS — R300 Dysuria: Secondary | ICD-10-CM | POA: Diagnosis not present

## 2015-05-10 DIAGNOSIS — N390 Urinary tract infection, site not specified: Secondary | ICD-10-CM

## 2015-05-10 LAB — POCT URINALYSIS DIPSTICK
Bilirubin, UA: NEGATIVE
Ketones, UA: NEGATIVE
Nitrite, UA: POSITIVE
Protein, UA: NEGATIVE
Spec Grav, UA: <=1.005
Urobilinogen, UA: 1
pH, UA: 6.5

## 2015-05-10 MED ORDER — CIPROFLOXACIN HCL 250 MG PO TABS: 250.0000 mg | ORAL_TABLET | Freq: Two times a day (BID) | ORAL | Status: DC

## 2015-05-10 NOTE — Progress Notes (Signed)
   Subjective:    Patient ID: Kaitlyn Flowers, female    DOB: [birth year], 74 y.o.   MRN: [id number]  HPI  acute visit. Patient seen with urine frequency for 5 days and some mild burning with urination. She took some Azo-Standard this morning which helped slightly. She's not had any gross hematuria. No flank pain. No fevers or chills. She is currently in the grieving process as her partner past away from pancreatic cancer about 6 weeks ago. She has excellent support and has had some counseling through hospice. Overall, she is coping fairly well  Past Medical History  Diagnosis Date  . Osteopenia   . 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  . Wears hearing aid   . PONV (postoperative nausea and vomiting)   . Anemia    Past Surgical History  Procedure Laterality Date  . Abdominal hysterectomy  1973    1/2 of ovary left cyst on ovary  . Shoulder rotator culff   2006  . Spinal cord tumor  1987    2012 october   . Appendectomy      removed with hysterectomy  . Rotator cuff repair      left   . Lumbar wound debridement  12/06/2011    Procedure: LUMBAR WOUND DEBRIDEMENT;  Surgeon: Kristeen Miss, MD;  Location: [facility];  Service: Neurosurgery;  Laterality: N/A;  Repair of Pseudomeningocele Lumbar wound    reports that she quit smoking about 47 years ago. Her smoking use included Cigarettes. She has a .5 pack-year smoking history. She does not have any smokeless tobacco history on file. She reports that she drinks about 2.4 oz of alcohol per week. She reports that she does not use illicit drugs. family history includes Heart attack in her father; Stroke in her mother. There is no history of Anesthesia problems. Allergies  Allergen Reactions  . Ibuprofen-Famotidine Other (See Comments)    Elevated BP  . Penicillins Rash      Review of Systems  Constitutional: Negative for fever, chills and appetite change.  Gastrointestinal: Negative for nausea, vomiting,  abdominal pain, diarrhea and constipation.  Genitourinary: Positive for dysuria and frequency. Negative for hematuria.  Musculoskeletal: Negative for back pain.  Neurological: Negative for dizziness.       Objective:   Physical Exam  Constitutional: She appears well-developed and well-nourished.  Cardiovascular: Normal rate and regular rhythm.  Exam reveals no gallop.   Pulmonary/Chest: Effort normal and breath sounds normal. No respiratory distress. She has no wheezes. She has no rales.  Musculoskeletal:  No CVA tenderness  Neurological: She is alert.          Assessment & Plan:  Probable uncomplicated cystitis. Urine culture sent. Cipro 250 mg twice a day for 5 days. Follow-up as needed.

## 2015-05-10 NOTE — Progress Notes (Signed)
Pre visit review using our clinic review tool, if applicable. No additional management support is needed unless otherwise documented below in the visit note. 

## 2015-05-10 NOTE — Patient Instructions (Signed)

## 2015-05-14 LAB — URINE CULTURE: Colony Count: >=100000

## 2015-05-23 ENCOUNTER — Ambulatory Visit (INDEPENDENT_AMBULATORY_CARE_PROVIDER_SITE_OTHER): Payer: Medicare PPO | Admitting: Internal Medicine

## 2015-05-23 ENCOUNTER — Encounter: Payer: Self-pay | Admitting: Internal Medicine

## 2015-05-23 VITALS — BP 118/68 | Temp 98.1°F | Ht 61.0 in | Wt 102.4 lb

## 2015-05-23 DIAGNOSIS — R3915 Urgency of urination: Secondary | ICD-10-CM

## 2015-05-23 DIAGNOSIS — R300 Dysuria: Secondary | ICD-10-CM

## 2015-05-23 DIAGNOSIS — Z23 Encounter for immunization: Secondary | ICD-10-CM

## 2015-05-23 LAB — POCT URINALYSIS DIPSTICK
Bilirubin, UA: NEGATIVE
Blood, UA: NEGATIVE
Glucose, UA: NEGATIVE
Ketones, UA: NEGATIVE
Leukocytes, UA: NEGATIVE
Nitrite, UA: POSITIVE
Protein, UA: NEGATIVE
Spec Grav, UA: 1.02
Urobilinogen, UA: 0.2
pH, UA: 6

## 2015-05-23 MED ORDER — LEVOFLOXACIN 500 MG PO TABS: 500.0000 mg | ORAL_TABLET | Freq: Every day | ORAL | Status: DC

## 2015-05-23 NOTE — Patient Instructions (Signed)
Begin different antibiotic . 5 days  Continue fluids .  Let us know if not better at end of medication . will let you know results of reculture .

## 2015-05-23 NOTE — Progress Notes (Signed)
Pre visit review using our clinic review tool, if applicable. No additional management support is needed unless otherwise documented below in the visit note.   Chief Complaint  Patient presents with  . Dysuria    Recently treated by Dr. Elease Hashimoto.  Completed Cipro but sx have returned.  . Urinary Urgency    HPI: Kaitlyn Flowers 74 y.o. . Comes in today for an acute visit. She was seen on September 7 for lower tract UTI symptoms treated with Cipro 250 twice a day for 5 days. Her urine culture grew out pseudomonas aeruginosa resistant to Rocephin sensitive to others tested. At the end of the medicine she felt pretty well however within a week her symptoms have come back not as bad without hematuria pack pain or fever but dysuria and frequency and urgency. She did taken Azo with some help.  Trying to get better for celebration to [place] of her life partner who died fairly quickly from cancer recently. ROS: See pertinent positives and negatives per HPI.  Past Medical History  Diagnosis Date  . Osteopenia   . 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  . Wears hearing aid   . PONV (postoperative nausea and vomiting)   . Anemia     Family History  Problem Relation Age of Onset  . Stroke Mother     2010  . Heart attack Father   . Anesthesia problems Neg Hx     Social History   Social History  . Marital Status: Legally Separated    Spouse Name: N/A  . Number of Children: N/A  . Years of Education: N/A   Social History Main Topics  . Smoking status: Former Smoker -- 0.25 packs/day for 2 years    Types: Cigarettes    Quit date: 09/03/1967  . Smokeless tobacco: None  . Alcohol Use: 2.4 oz/week    4 Glasses of wine per week  . Drug Use: No  . Sexual Activity: Not Asked   Other Topics Concern  . 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 b complex vitamins capsule Take 1 capsule by mouth daily.    . baclofen (LIORESAL) 10 MG tablet   0  . Carbonyl Iron (PERFECT IRON) 25 MG TABS Take 25 mg by mouth daily.    . Cholecalciferol (VITAMIN D3) 5000 UNITS CAPS Take 1 capsule by mouth daily.    . ciprofloxacin (CIPRO) 250 MG tablet Take 1 tablet (250 mg total) by mouth 2 (two) times daily. 10 tablet 0  . Ibuprofen-Famotidine 800-26.6 MG TABS Take 1 tablet 3 times a day as needed. 90 tablet 1  . pregabalin (LYRICA) 50 MG capsule Take 50 mg by mouth as needed.     No facility-administered medications prior to visit.     EXAM:  BP 118/68 mmHg  Temp(Src) 98.1 F (36.7 C) (Oral)  Ht 5\' 1"  (1.549 m)  Wt 102 lb 6.4 oz (46.448 kg)  BMI 19.36 kg/m2  Body mass index is 19.36 kg/(m^2).  GENERAL: vitals reviewed and listed above, alert, oriented, appears well hydrated and in no acute distress MS: moves all extremities without noticeable focal  abnormality PSYCH: pleasant and cooperative,   appropriate affect and cognition. Urinalysis positive for nitrites  review of previous cultures.  ASSESSMENT AND PLAN:  Discussed the following assessment and plan:  Dysuria - Relapsing symptoms after Cipro 250 twice a day pseudomonas UTI resistant to Rocephin. Treat with broader spectrum Levaquin 500 for 5 days close follow-up approp - Plan: POC Urinalysis Dipstick, Culture, Urine  Urinary urgency - Plan: POC Urinalysis Dipstick, Culture, Urine  Need for prophylactic vaccination and inoculation against influenza - Plan: Flu vaccine HIGH DOSE PF (Fluzone High dose) Expectant management. Reculture -Patient advised to return or notify health care team  if symptoms worsen ,persist or new concerns arise.  Patient Instructions  Begin different antibiotic . 5 days  Continue fluids .  Let us know if not better at end of medication . will let you know results of  reculture .     Standley Brooking. Panosh M.D.

## 2015-05-25 LAB — URINE CULTURE: Colony Count: 10000

## 2015-05-25 NOTE — Progress Notes (Signed)
No answer on cell phone.

## 2015-05-25 NOTE — Progress Notes (Signed)
No answer

## 2015-06-12 ENCOUNTER — Telehealth: Payer: Self-pay | Admitting: Internal Medicine

## 2015-06-12 MED ORDER — PREGABALIN 25 MG PO CAPS: 25.0000 mg | ORAL_CAPSULE | Freq: Two times a day (BID) | ORAL | Status: DC

## 2015-06-12 NOTE — Telephone Encounter (Signed)
Refill done.  

## 2015-06-12 NOTE — Telephone Encounter (Signed)
Pt requesting refill for pregabalin (LYRICA) 25 MG capsule [[id number] Pharmacy is [facility] on [address]

## 2015-06-27 ENCOUNTER — Ambulatory Visit (INDEPENDENT_AMBULATORY_CARE_PROVIDER_SITE_OTHER): Payer: Medicare PPO | Admitting: Family Medicine

## 2015-06-27 ENCOUNTER — Encounter: Payer: Self-pay | Admitting: Family Medicine

## 2015-06-27 VITALS — BP 120/64 | HR 72 | Ht 61.0 in | Wt 108.0 lb

## 2015-06-27 DIAGNOSIS — M4806 Spinal stenosis, lumbar region: Secondary | ICD-10-CM | POA: Diagnosis not present

## 2015-06-27 DIAGNOSIS — M48062 Spinal stenosis, lumbar region with neurogenic claudication: Secondary | ICD-10-CM

## 2015-06-27 MED ORDER — OXYCODONE-ACETAMINOPHEN 5-325 MG PO TABS: 1.0000 | ORAL_TABLET | Freq: Three times a day (TID) | ORAL | Status: DC | PRN

## 2015-06-27 MED ORDER — PREDNISONE 20 MG PO TABS: ORAL_TABLET | ORAL | Status: DC

## 2015-06-27 NOTE — Patient Instructions (Signed)
Lets hit you again with prednisone take as directed.  Heat 10 minutes then ice 10 minutes then off for 1 hour and do as much as you want.  Continue to stay active.  Send me a message in 1 week . See me again in 4 weeks.

## 2015-06-27 NOTE — Progress Notes (Signed)
[name] [place], [place] [address] Phone: [phone] Subjective:    CC: Acute low back pain followup.  MVE:[id number] Kaitlyn Flowers is a 74 y.o. female coming in with complaint of low back pain.patient does have radicular symptoms.  Patient has had this pain for quite some time. Patient has been doing the exercises as well as taking the natural vitamins and states that the radicular symptoms in the buttocks and down the leg has improved. Continuing to have the chronic aching pain in the back.  [facility]  and did have an evaluation by neurology who is concern that patient's previous surgeries likely are contributing with secondary to scar tissue formation. Patient states that they felt that the only thing that could be helpful would be chronic narcotics. Patient wanted further evaluation. Continues to take the Lyrica at night. Does need pain medications from time to time. Feels that the pain seems to be lower. Did have a recent epidural steroid injection as well as facet injection months ago at a different healthcare providers office with no significant improvement. Patient is fairly distraught at this time. Trying to find out what else can be done without any surgical intervention. Patient also does not want to take chronic pain medications if possible.   More past medical history Patient at last visit did have labs drawn and did have a positive in ANA but a negative titer. Patient did have a minor elevation an ESR and a minor elevation and parathyroid hormone. Patient's calcium is borderline high. Please see labs for further information.  X-rays were reviewed by me a previously and patient does have new degenerative changes at L3-L4, L4-L5, and L5-S1. Patient does have some foraminal narrowing on the right L5-S1. MRI on 10/16/2015showed multiple levels of foraminal narrowing especially at the L4-L5 on the left side and L5-S1 on the  left side as well. Moderate spinal stenosis noted. Patient had a epidural steroid injection 06/27/2014 with no significant improvement. History of removal of a spinal cord tumor.    Past medical history, social, surgical and family history all reviewed in electronic medical record.   Review of Systems: No headache, visual changes, nausea, vomiting, diarrhea, constipation, dizziness, abdominal pain, skin rash, fevers, chills, night sweats, weight loss, swollen lymph nodes, body aches, joint swelling, muscle aches, chest pain, shortness of breath, mood changes.   Objective Blood pressure 120/64, pulse 72, height $RemoveBe'5\' 1"'hyQtNmUjL$  (1.549 m), weight 108 lb (48.988 kg), SpO2 97 %.  General: No apparent distress alert and oriented x3 mood and affect normal, dressed appropriately.  HEENT: Pupils equal, extraocular movements intact  Respiratory: Patient's speak in full sentences and does not appear short of breath  Cardiovascular: No lower extremity edema, non tender, no erythema  Skin: Warm dry intact with no signs of infection or rash on extremities or on axial skeleton.  Abdomen: Soft nontender  Neuro: Cranial nerves II through XII are intact, neurovascularly intact in all extremities with 2+ DTRs and 2+ pulses.  Lymph: No lymphadenopathy of posterior or anterior cervical chain or axillae bilaterally.  Gait normal with good balance and coordination.  MSK:  Non tender with full range of motion and good stability and symmetric strength and tone of shoulders, elbows, wrist, hip, knee and ankles bilaterally.  Back Exam:  Inspection: Unremarkable  Motion: Flexion 45 deg, Extension 25 deg, Side Bending to 45 deg bilaterally,  Rotation to 45 deg bilaterally  SLR laying: Negative  XSLR  laying: Negative  Palpable tenderness: Severe tenderness over the right sacroiliac joint as well as the right spinal musculature of the lumbar spine FABER: Positive right still present Sensory change: Gross sensation intact to all  lumbar and sacral dermatomes.  Reflexes: 2+ at both patellar tendons, 2+ at achilles tendons, Babinski's downgoing.  Strength at foot  Plantar-flexion: 5/5 Dorsi-flexion: 5/5 Eversion: 5/5 Inversion: 5/5  Leg strength  Quad: 5/5 Hamstring: 5/5 Hip flexor: 5/5 Hip abductors: 4/5  No significant radicular symptoms today. Minor improvement from previous exam.  Wt Readings from Last 3 Encounters:  06/27/15 108 lb (48.988 kg)  05/23/15 102 lb 6.4 oz (46.448 kg)  05/10/15 103 lb (46.72 kg)       Impression and Recommendations:     This case required medical decision making of moderate complexity.

## 2015-06-27 NOTE — Progress Notes (Signed)
Pre visit review using our clinic review tool, if applicable. No additional management support is needed unless otherwise documented below in the visit note. 

## 2015-06-27 NOTE — Assessment & Plan Note (Signed)
I do believe the patient's symptoms and likely spinal stenosis seems to get exacerbated from time to time. I do not see any surgical intervention would be beneficial. 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 Patient has elected try a long course of tendon zone. Think that this could be potentially beneficial. Encourage her to monitor for any type of stomach discomfort. Patient warned of potential side effects. We discussed starting and continuing the different core strengthening exercises. Patient will come back and see me again in 4 weeks. Continuing to have pain she may send me a message sooner and we'll consider ordering the epidural. If this fails. Having exhausted all other treatment options at this time and patient would have to either continue with the pain management were discussed surgical intervention. Patient was given some pain medication today but we discussed with her we will not do this on a regular basis.  Spent  25 minutes with patient face-to-face and had greater than 50% of counseling including as described above in assessment and plan.

## 2015-07-04 ENCOUNTER — Encounter: Payer: Self-pay | Admitting: Family Medicine

## 2015-07-05 ENCOUNTER — Encounter: Payer: Self-pay | Admitting: Family Medicine

## 2015-07-10 ENCOUNTER — Other Ambulatory Visit (INDEPENDENT_AMBULATORY_CARE_PROVIDER_SITE_OTHER): Payer: Medicare PPO

## 2015-07-10 ENCOUNTER — Encounter: Payer: Self-pay | Admitting: Family Medicine

## 2015-07-10 ENCOUNTER — Ambulatory Visit (INDEPENDENT_AMBULATORY_CARE_PROVIDER_SITE_OTHER): Payer: Medicare PPO | Admitting: Family Medicine

## 2015-07-10 VITALS — BP 110/68 | HR 85 | Ht 61.0 in | Wt 104.0 lb

## 2015-07-10 DIAGNOSIS — M461 Sacroiliitis, not elsewhere classified: Secondary | ICD-10-CM | POA: Diagnosis not present

## 2015-07-10 DIAGNOSIS — M533 Sacrococcygeal disorders, not elsewhere classified: Secondary | ICD-10-CM

## 2015-07-10 MED ORDER — GABAPENTIN 100 MG PO CAPS
100.0000 mg | ORAL_CAPSULE | Freq: Two times a day (BID) | ORAL | Status: DC
Start: 2015-07-10 — End: 2015-08-01

## 2015-07-10 NOTE — Progress Notes (Signed)
[name] [place], [place] [address] Phone: [phone] Subjective:    CC: Acute low back pain followup.  IRC:[id number] Kaitlyn Flowers is a 74 y.o. female coming in with complaint of low back pain.patient does have radicular symptoms.  Patient has had this pain for quite some time. Patient has had significant workup for this. Patient did have some mild spinal stenosis that seemed to be progressing from previous MRI. Patient elected try an epidural. Did not have any significant relief. Was on prednisone that did help somewhat. Has had a positive ANA but negative titers. History of the removal of the spinal cord tumor. Has tried multiple different medications previously as well as formal physical therapy. Icing many different specialists.   Patient states new pain around the right SI joint.  Patient states that this seems to be radiating down the lateral aspect of her leg. Patient has had difficulty with this previously but not as severe. Stopping her from any activities. Having a take pain medication fairly regularly. Unable to sit down or lay down without severe pain. Feels different than her chronic back pain with radiation.    More past medical history Patient at last visit did have labs drawn and did have a positive in ANA but a negative titer. Patient did have a minor elevation an ESR and a minor elevation and parathyroid hormone. Patient's calcium is borderline high. Please see labs for further information.  X-rays were reviewed by me a previously and patient does have new degenerative changes at L3-L4, L4-L5, and L5-S1. Patient does have some foraminal narrowing on the right L5-S1. MRI on 10/16/2015showed multiple levels of foraminal narrowing especially at the L4-L5 on the left side and L5-S1 on the left side as well. Moderate spinal stenosis noted. Patient had a epidural steroid injection 06/27/2014 with no significant improvement. History  of removal of a spinal cord tumor.    Past medical history, social, surgical and family history all reviewed in electronic medical record.   Review of Systems: No headache, visual changes, nausea, vomiting, diarrhea, constipation, dizziness, abdominal pain, skin rash, fevers, chills, night sweats, weight loss, swollen lymph nodes, body aches, joint swelling, muscle aches, chest pain, shortness of breath, mood changes.   Objective Blood pressure 110/68, pulse 85, height _0  (1.549 m), weight 104 lb (47.174 kg), SpO2 98 %.  General: No apparent distress alert and oriented x3 mood and affect normal, dressed appropriately.  HEENT: Pupils equal, extraocular movements intact  Respiratory: Patient's speak in full sentences and does not appear short of breath  Cardiovascular: No lower extremity edema, non tender, no erythema  Skin: Warm dry intact with no signs of infection or rash on extremities or on axial skeleton.  Abdomen: Soft nontender  Neuro: Cranial nerves II through XII are intact, neurovascularly intact in all extremities with 2+ DTRs and 2+ pulses.  Lymph: No lymphadenopathy of posterior or anterior cervical chain or axillae bilaterally.  Gait normal with good balance and coordination.  MSK:  Non tender with full range of motion and good stability and symmetric strength and tone of shoulders, elbows, wrist, hip, knee and ankles bilaterally.  Back Exam:  Inspection: Unremarkable  Motion: Flexion 45 deg, Extension 25 deg, Side Bending to 45 deg bilaterally,  Rotation to 45 deg bilaterally  SLR laying: Negative  XSLR laying: Negative  Palpable tenderness: Severe tenderness over the right sacroiliac joint as well as the right spinal musculature of the lumbar spine FABER: Positive  right still present Sensory change: Gross sensation intact to all lumbar and sacral dermatomes.  Reflexes: 2+ at both patellar tendons, 2+ at achilles tendons, Babinski's downgoing.  Strength at foot    Plantar-flexion: 5/5 Dorsi-flexion: 5/5 Eversion: 5/5 Inversion: 5/5  Leg strength  Quad: 5/5 Hamstring: 5/5 Hip flexor: 5/5 Hip abductors: 4/5  No significant radicular symptoms today. Minor improvement from previous exam.  Wt Readings from Last 3 Encounters:  07/10/15 104 lb (47.174 kg)  06/27/15 108 lb (48.988 kg)  05/23/15 102 lb 6.4 oz (46.448 kg)    Procedure: Real-time Ultrasound Guided Injection of right sacroiliac joint Device: GE Logiq E  Ultrasound guided injection is preferred based studies that show increased duration, increased effect, greater accuracy, decreased procedural pain, increased response rate, and decreased cost with ultrasound guided versus blind injection.  Verbal informed consent obtained.  Time-out conducted.  Noted no overlying erythema, induration, or other signs of local infection.  Skin prepped in a sterile fashion.  Local anesthesia: Topical Ethyl chloride.  With sterile technique and under real time ultrasound guidance:  Patient with a 22-gauge 3 inch needle was injected into the right sacroiliac joint. Total of 1 mL of 0.5% Marcaine and 1 mL of Kenalog 40 mg/dL injected. Pictures saved Completed without difficulty  Pain improved Advised to call if fevers/chills, erythema, induration, drainage, or persistent bleeding.  Images permanently stored and available for review in the ultrasound unit.  Impression: Technically successful ultrasound guided injection.   Impression and Recommendations:     This case required medical decision making of moderate complexity.

## 2015-07-10 NOTE — Patient Instructions (Addendum)
Good to see you I really wish I had a magic wond.  Gabapentin 100mg  2 times daily If no better in 24 hours  Try uloric daily for 1 week.  Otherwise call me and consider treatment lyme disease Otherwise CT myelogram.  Give me an update tomorrow if you can.

## 2015-07-10 NOTE — Assessment & Plan Note (Signed)
Patient does have more of a sacroiliitis. Was given an injection today. Did not have this severe amount of decreasing pain but did have some. We discussed with patient otherwise it is more of a lumbar radiculopathy. We discussed that patient may need to see her surgeon for further evaluation in patient given side effects as well as signs and symptoms and when to seek medical attention. Patient was given some gabapentin at a very low dose to see if she can take this during the day without any significant somnolence. We discussed other differentials that are quite broad including treatment for Lyme disease. Patient if continuing have pain to make do well with a CT myelogram. Discussed this at follow-up.  Spent  25 minutes with patient face-to-face and had greater than 50% of counseling including as described above in assessment and plan.

## 2015-07-10 NOTE — Progress Notes (Signed)
Pre visit review using our clinic review tool, if applicable. No additional management support is needed unless otherwise documented below in the visit note. 

## 2015-07-14 ENCOUNTER — Encounter: Payer: Self-pay | Admitting: Family Medicine

## 2015-07-14 DIAGNOSIS — M5416 Radiculopathy, lumbar region: Secondary | ICD-10-CM

## 2015-07-25 ENCOUNTER — Ambulatory Visit: Payer: Medicare PPO | Admitting: Family Medicine

## 2015-08-01 ENCOUNTER — Encounter: Payer: Self-pay | Admitting: Internal Medicine

## 2015-08-01 ENCOUNTER — Ambulatory Visit (INDEPENDENT_AMBULATORY_CARE_PROVIDER_SITE_OTHER): Payer: Medicare PPO | Admitting: Internal Medicine

## 2015-08-01 VITALS — BP 128/66 | Temp 97.6°F | Ht 61.0 in | Wt 106.0 lb

## 2015-08-01 DIAGNOSIS — M5416 Radiculopathy, lumbar region: Secondary | ICD-10-CM

## 2015-08-01 DIAGNOSIS — L819 Disorder of pigmentation, unspecified: Secondary | ICD-10-CM | POA: Diagnosis not present

## 2015-08-01 NOTE — Patient Instructions (Addendum)
Will arrange  Referral  For skin lesion evaluation.  Because of change  In  Lesion on face.  Retry water exercise .  Consider  trying increase lyrica to twice a day.   Skin Surgery [facility]  [address], [place], [place] [address] [phone]

## 2015-08-01 NOTE — Progress Notes (Signed)
Pre visit review using our clinic review tool, if applicable. No additional management support is needed unless otherwise documented below in the visit note.  Chief Complaint  Patient presents with  . Nevus    HPI: Patient Kaitlyn Flowers  comes in today for SDA for  new problem evaluation. Has noted recently that 2 areas on face getting larger uncertain in how much time .    rough and larger on right .  Pain back    ongoing tryin g  Not to   take reg narcotics  Feels getting deconditioningTaking lyrica at night . ROS: See pertinent positives and negatives per HPI.  Past Medical History  Diagnosis Date  . Osteopenia   . 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  . Wears hearing aid   . PONV (postoperative nausea and vomiting)   . Anemia     Family History  Problem Relation Age of Onset  . Stroke Mother     2010  . Heart attack Father   . Anesthesia problems Neg Hx     Social History   Social History  . Marital Status: Legally Separated    Spouse Name: N/A  . Number of Children: N/A  . Years of Education: N/A   Social History Main Topics  . Smoking status: Former Smoker -- 0.25 packs/day for 2 years    Types: Cigarettes    Quit date: 09/03/1967  . Smokeless tobacco: None  . Alcohol Use: 2.4 oz/week    4 Glasses of wine per week  . Drug Use: No  . Sexual Activity: Not Asked   Other Topics Concern  . 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 b complex vitamins capsule Take 1 capsule by mouth daily.    Carin Hock Iron (PERFECT IRON) 25 MG TABS Take 25 mg by mouth daily.    . Cholecalciferol (VITAMIN D3) 5000 UNITS CAPS Take 1 capsule by mouth daily.    Marland Kitchen oxyCODONE-acetaminophen (PERCOCET/ROXICET) 5-325 MG tablet Take 1 tablet by mouth every 8  (eight) hours as needed. 30 tablet 0  . pregabalin (LYRICA) 25 MG capsule Take 1 capsule (25 mg total) by mouth 2 (two) times daily. 180 capsule 0  . baclofen (LIORESAL) 10 MG tablet   0  . gabapentin (NEURONTIN) 100 MG capsule Take 1 capsule (100 mg total) by mouth 2 (two) times daily. 60 capsule 3  . levofloxacin (LEVAQUIN) 500 MG tablet Take 1 tablet (500 mg total) by mouth daily. 5 tablet 0   No facility-administered medications prior to visit.     EXAM:  BP 128/66 mmHg  Temp(Src) 97.6 F (36.4 C) (Oral)  Ht 5\' 1"  (1.549 m)  Wt 106 lb (48.081 kg)  BMI 20.04 kg/m2  Body mass index is 20.04 kg/(m^2).  GENERAL: vitals reviewed and listed above, alert, oriented, appears well hydrated and in no acute distress HEENT: atraumatic, conjunctiva  clear, no obvious abnormalities on inspection of external nose and ears FACE:   1.+ cm rough dark scaly well demarcated in right lateral cheek    8 mm pink skin colored lesion near left eye temporal area  PSYCH: pleasant and cooperative, no obvious depression or anxiety  ASSESSMENT AND PLAN:  Discussed the  following assessment and plan:  Pigmented skin lesion of uncertain nature - scaling right cheek area  other is softer well demarcatee left temporal area  - Plan: Ambulatory referral to Dermatology  Lumbar radicular pain - disc ongoing pain fu dr elsner  water  exercise  for conditioning and other to try Total visit 32mins > 50% spent counseling and coordinating care as indicated in above note and in instructions to patient . Strategies discussed  -Patient advised to return or notify health care team  if symptoms worsen ,persist or new concerns arise.  Patient Instructions  Will arrange  Referral  For skin lesion evaluation.  Because of change  In  Lesion on face.  Retry water exercise .  Consider  trying increase lyrica to twice a day.   Skin Surgery [facility]  [address], [place],  [address] [phone]  [phone]        Standley Brooking. Panosh M.D.

## 2015-08-10 ENCOUNTER — Encounter: Payer: Self-pay | Admitting: Family Medicine

## 2015-08-14 ENCOUNTER — Other Ambulatory Visit: Payer: Medicare PPO

## 2015-09-17 ENCOUNTER — Encounter: Payer: Self-pay | Admitting: Family Medicine

## 2015-09-27 ENCOUNTER — Encounter: Payer: Self-pay | Admitting: Family Medicine

## 2015-09-27 ENCOUNTER — Ambulatory Visit (INDEPENDENT_AMBULATORY_CARE_PROVIDER_SITE_OTHER): Payer: Medicare Other | Admitting: Family Medicine

## 2015-09-27 VITALS — BP 124/64 | HR 81 | Temp 98.3°F | Ht 61.0 in | Wt 101.7 lb

## 2015-09-27 DIAGNOSIS — IMO0001 Reserved for inherently not codable concepts without codable children: Secondary | ICD-10-CM

## 2015-09-27 DIAGNOSIS — Z658 Other specified problems related to psychosocial circumstances: Secondary | ICD-10-CM

## 2015-09-27 DIAGNOSIS — R030 Elevated blood-pressure reading, without diagnosis of hypertension: Secondary | ICD-10-CM | POA: Diagnosis not present

## 2015-09-27 DIAGNOSIS — H578 Other specified disorders of eye and adnexa: Secondary | ICD-10-CM | POA: Diagnosis not present

## 2015-09-27 DIAGNOSIS — H5789 Other specified disorders of eye and adnexa: Secondary | ICD-10-CM

## 2015-09-27 DIAGNOSIS — F439 Reaction to severe stress, unspecified: Secondary | ICD-10-CM

## 2015-09-27 NOTE — Progress Notes (Signed)
HPI:  Kaitlyn Flowers is a pleasant 74 yo F patient of Dr. Regis Bill here for and acute visit to check her blood pressure. Reports SBP elevate don 2 occasion at [facility] in 140-150 range. Also had 3 small headaches last week - now resolved. Also has felt that vision in L eye may be slightly off and this eye has felt irritated - wears glasses, sees optho. Is under a lot of stress as lost her spouse and her chronic nerve pain is not well controlled - seeing specialist for this. Getting counseling. Denies persistent HA, SI, CP, SOB, DOE, fevers, malaise, palpitations.   ROS: See pertinent positives and negatives per HPI.  Past Medical History  Diagnosis Date  . Osteopenia   . 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  . Wears hearing aid   . PONV (postoperative nausea and vomiting)   . Anemia     Past Surgical History  Procedure Laterality Date  . Abdominal hysterectomy  1973    1/2 of ovary left cyst on ovary  . Shoulder rotator culff   2006  . Spinal cord tumor  1987    2012 october   . Appendectomy      removed with hysterectomy  . Rotator cuff repair      left   . Lumbar wound debridement  12/06/2011    Procedure: LUMBAR WOUND DEBRIDEMENT;  Surgeon: Kristeen Miss, MD;  Location: [facility];  Service: Neurosurgery;  Laterality: N/A;  Repair of Pseudomeningocele Lumbar wound    Family History  Problem Relation Age of Onset  . Stroke Mother     2010  . Heart attack Father   . Anesthesia problems Neg Hx     Social History   Social History  . Marital Status: Legally Separated    Spouse Name: N/A  . Number of Children: N/A  . Years of Education: N/A   Social History Main Topics  . Smoking status: Former Smoker -- 0.25 packs/day for 2 years    Types: Cigarettes    Quit date: 09/03/1967  . Smokeless tobacco: None  . Alcohol Use: 2.4 oz/week    4 Glasses of wine per week  . Drug Use: No  . Sexual Activity: Not Asked   Other Topics  Concern  . 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  b complex vitamins capsule, Take 1 capsule by mouth daily., Disp: , Rfl:  .  Carbonyl Iron (PERFECT IRON) 25 MG TABS, Take 25 mg by mouth daily., Disp: , Rfl:  .  Cholecalciferol (VITAMIN D3) 5000 UNITS CAPS, Take 1 capsule by mouth daily., Disp: , Rfl:  .  pregabalin (LYRICA) 25 MG capsule, Take 1 capsule (25 mg total) by mouth 2 (two) times daily., Disp: 180 capsule, Rfl: 0  EXAM:  Filed Vitals:   09/27/15 1116  BP: 124/64  Pulse: 81  Temp: 98.3 F (36.8 C)    Body mass index is 19.23 kg/(m^2).  GENERAL: vitals reviewed and listed above, alert, oriented, appears well hydrated and in no acute distress  HEENT: atraumatic, conjunttiva mildly eythematous, PERRLA, EOMI, visual acuity grossly intact bilaterally, no obvious abnormalities on inspection of external nose and ears  NECK: no obvious masses on inspection  LUNGS: clear  to auscultation bilaterally, no wheezes, rales or rhonchi, good air movement  CV: HRRR, no peripheral edema  MS: moves all extremities without noticeable abnormality  PSYCH/NEURO: pleasant and cooperative, anxious, tearful when talking about the stress in her life, speech and thought processing grossly intact, gait normal, CN II-XII grossly intact, finger to nose normal.  ASSESSMENT AND PLAN:  Discussed the following assessment and plan:  Elevated blood pressure  Eye irritation  Stress  -BP great and neuro and gross optho exam benign -advised optho eval for eye concerns -she is seeing counselor for stress and feels is coping ok despite situation -headaches resolved -follow up with PCP as scheduled -Patient advised to return or notify a doctor immediately if symptoms worsen, recur or persist or new concerns arise.  Patient Instructions   Please keep your follow up with your primary doctor.  You blood pressure looked very good today.  Please schedule an appointment with your eye doctor.  Please keep track of any further headaches if they should occur.  Follow up with your doctor promptly if an new or worsening concerns.     Colin Benton R.

## 2015-09-27 NOTE — Patient Instructions (Signed)
Please keep your follow up with your primary doctor.  You blood pressure looked very good today.  Please schedule an appointment with your eye doctor.  Please keep track of any further headaches if they should occur.  Follow up with your doctor promptly if an new or worsening concerns.

## 2015-09-27 NOTE — Progress Notes (Signed)
Pre visit review using our clinic review tool, if applicable. No additional management support is needed unless otherwise documented below in the visit note. 

## 2015-10-02 ENCOUNTER — Ambulatory Visit (INDEPENDENT_AMBULATORY_CARE_PROVIDER_SITE_OTHER): Payer: Medicare Other | Admitting: Internal Medicine

## 2015-10-02 ENCOUNTER — Encounter: Payer: Self-pay | Admitting: Internal Medicine

## 2015-10-02 VITALS — BP 124/60 | Temp 97.6°F | Ht 61.0 in | Wt 103.0 lb

## 2015-10-02 DIAGNOSIS — R300 Dysuria: Secondary | ICD-10-CM | POA: Diagnosis not present

## 2015-10-02 DIAGNOSIS — R3915 Urgency of urination: Secondary | ICD-10-CM

## 2015-10-02 DIAGNOSIS — R82998 Other abnormal findings in urine: Secondary | ICD-10-CM

## 2015-10-02 DIAGNOSIS — N390 Urinary tract infection, site not specified: Secondary | ICD-10-CM | POA: Insufficient documentation

## 2015-10-02 LAB — POCT URINALYSIS DIPSTICK
Bilirubin, UA: NEGATIVE
Blood, UA: NEGATIVE
Glucose, UA: NEGATIVE
Ketones, UA: NEGATIVE
Nitrite, UA: POSITIVE
Protein, UA: NEGATIVE
Spec Grav, UA: 1.02
Urobilinogen, UA: 0.2
pH, UA: 6

## 2015-10-02 MED ORDER — CIPROFLOXACIN HCL 500 MG PO TABS: 500.0000 mg | ORAL_TABLET | Freq: Two times a day (BID) | ORAL | Status: DC

## 2015-10-02 NOTE — Progress Notes (Signed)
Pre visit review using our clinic review tool, if applicable. No additional management support is needed unless otherwise documented below in the visit note.  Chief Complaint  Patient presents with  . Dysuria  . Urinary Urgency    HPI: Patient Kaitlyn Flowers  comes in today for SDA for  new problem evaluation. Onset  Acute  Jan 27 before weekend  Took azo  And  D mannose.  And then had leg cramps.  Leg cramps at  Night  So stopped this med No gross  hematuria  No fever chills   All to pcn rash all over  ROS: See pertinent positives and negatives per HPI.  Past Medical History  Diagnosis Date  . Osteopenia   . 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  . Wears hearing aid   . PONV (postoperative nausea and vomiting)   . Anemia     Family History  Problem Relation Age of Onset  . Stroke Mother     2010  . Heart attack Father   . Anesthesia problems Neg Hx     Social History   Social History  . Marital Status: Legally Separated    Spouse Name: N/A  . Number of Children: N/A  . Years of Education: N/A   Social History Main Topics  . Smoking status: Former Smoker -- 0.25 packs/day for 2 years    Types: Cigarettes    Quit date: 09/03/1967  . Smokeless tobacco: None  . Alcohol Use: 2.4 oz/week    4 Glasses of wine per week  . Drug Use: No  . Sexual Activity: Not Asked   Other Topics Concern  . 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 b complex vitamins capsule Take 1 capsule by mouth daily.    Carin Hock Iron (PERFECT IRON) 25 MG TABS Take 25 mg by mouth daily.    . Cholecalciferol (VITAMIN D3) 5000 UNITS CAPS Take 1 capsule by mouth daily.    . pregabalin (LYRICA) 25 MG capsule Take 1 capsule (25 mg total) by mouth 2 (two) times daily. 180  capsule 0   No facility-administered medications prior to visit.     EXAM:  BP 124/60 mmHg  Temp(Src) 97.6 F (36.4 C) (Oral)  Ht 5\' 1"  (1.549 m)  Wt 103 lb (46.72 kg)  BMI 19.47 kg/m2  Body mass index is 19.47 kg/(m^2).  GENERAL: vitals reviewed and listed above, alert, oriented, appears well hydrated and in no acute distress non toxic  HEENT: atraumatic, conjunctiva  clear, no obvious abnormalities on inspection of external nose and ears  no cva tenderness  PSYCH: pleasant and cooperative, no obvious depression or anxiety UA 3+ leuk po nitrites  ASSESSMENT AND PLAN:  Discussed the following assessment and plan:  Dysuria - Plan: POC Urinalysis Dipstick, Culture, Urine  Urgency of urination - Plan: POC Urinalysis Dipstick, Culture, Urine  Leukocytes in urine - Plan: Culture, Urine  UTI (lower urinary tract infection) - hx of same last was psudomonas r to cpehalo all to pcn anywya rx cipro 3 days follow cx results   -Patient advised to return or notify health care team  if symptoms worsen ,persist or new concerns arise.  Patient Instructions  You  have uti  Take antibiotic  And contact us if not better. Culture pending.      Standley Brooking. Barnes Florek M.D.

## 2015-10-02 NOTE — Patient Instructions (Signed)
You have uti  Take antibiotic  And contact us if not better. Culture pending.

## 2015-10-05 ENCOUNTER — Encounter: Payer: Self-pay | Admitting: Internal Medicine

## 2015-10-05 ENCOUNTER — Ambulatory Visit (INDEPENDENT_AMBULATORY_CARE_PROVIDER_SITE_OTHER): Payer: Medicare Other | Admitting: Internal Medicine

## 2015-10-05 VITALS — BP 136/68 | Temp 97.8°F | Ht 61.0 in | Wt 102.0 lb

## 2015-10-05 DIAGNOSIS — B965 Pseudomonas (aeruginosa) (mallei) (pseudomallei) as the cause of diseases classified elsewhere: Secondary | ICD-10-CM | POA: Diagnosis not present

## 2015-10-05 DIAGNOSIS — Z87440 Personal history of urinary (tract) infections: Secondary | ICD-10-CM

## 2015-10-05 DIAGNOSIS — N390 Urinary tract infection, site not specified: Secondary | ICD-10-CM | POA: Diagnosis not present

## 2015-10-05 DIAGNOSIS — G4762 Sleep related leg cramps: Secondary | ICD-10-CM

## 2015-10-05 LAB — POCT URINALYSIS DIPSTICK
Bilirubin, UA: NEGATIVE
Blood, UA: NEGATIVE
Glucose, UA: NEGATIVE
Ketones, UA: NEGATIVE
Nitrite, UA: NEGATIVE
Protein, UA: NEGATIVE
Spec Grav, UA: 1.015
Urobilinogen, UA: 0.2
pH, UA: 7

## 2015-10-05 LAB — URINE CULTURE: Colony Count: >=100000

## 2015-10-05 MED ORDER — FOSFOMYCIN TROMETHAMINE 3 G PO PACK: 3.0000 g | PACK | Freq: Once | ORAL | Status: DC

## 2015-10-05 NOTE — Patient Instructions (Signed)
You have a germ that is resistant to the oral antibiotics tested ( as opposed to  Injectables )  I will contact ID about best way to treat.     And then plan fu   COntact  Back specialist about  ?  Leg cramps .

## 2015-10-05 NOTE — Progress Notes (Signed)
Pre visit review using our clinic review tool, if applicable. No additional management support is needed unless otherwise documented below in the visit note.   Chief Complaint  Patient presents with  . Follow-up    HPI: Kaitlyn Flowers 75 y.o. comes back in after rx with cipro fir uti  Because not better   No blood  In urine  Feels like pressure and urgency .  And burning    Continuing  No fever and chiils  No flank pain abd pain with this  Denies specific  baldder dysfunction noted  Spasm in legs  And not sleeping  ? Not related  To   uti .  having low back issues with radiation under care elsner and  harkins  ROS: See pertinent positives and negatives per HPI.  Past Medical History  Diagnosis Date  . Osteopenia   . 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  . Wears hearing aid   . PONV (postoperative nausea and vomiting)   . Anemia     Family History  Problem Relation Age of Onset  . Stroke Mother     2010  . Heart attack Father   . Anesthesia problems Neg Hx     Social History   Social History  . Marital Status: Legally Separated    Spouse Name: N/A  . Number of Children: N/A  . Years of Education: N/A   Social History Main Topics  . Smoking status: Former Smoker -- 0.25 packs/day for 2 years    Types: Cigarettes    Quit date: 09/03/1967  . Smokeless tobacco: None  . Alcohol Use: 2.4 oz/week    4 Glasses of wine per week  . Drug Use: No  . Sexual Activity: Not Asked   Other Topics Concern  . 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 b complex vitamins capsule Take 1 capsule by mouth daily.    Carin Hock Iron (PERFECT IRON) 25 MG TABS Take 25 mg by mouth daily.    . Cholecalciferol (VITAMIN D3) 5000 UNITS CAPS Take 1 capsule by mouth  daily.    . pregabalin (LYRICA) 25 MG capsule Take 1 capsule (25 mg total) by mouth 2 (two) times daily. 180 capsule 0  . ciprofloxacin (CIPRO) 500 MG tablet Take 1 tablet (500 mg total) by mouth 2 (two) times daily. 6 tablet 0   No facility-administered medications prior to visit.     EXAM:  BP 136/68 mmHg  Temp(Src) 97.8 F (36.6 C) (Oral)  Ht 5\' 1"  (1.549 m)  Wt 102 lb (46.267 kg)  BMI 19.28 kg/m2  Body mass index is 19.28 kg/(m^2).  GENERAL: vitals reviewed and listed above, alert, oriented, appears well hydrated and in no acute distress HEENT: atraumatic, conjunctiva  clear, no obvious abnormalities on inspection of external nose and ears  NECK: no obvious masses on inspection palpation  Abdomen:  Sof,t normal bowel sounds without hepatosplenomegaly, no guarding rebound or masses no CVA tenderness MS: moves all extremities without noticeable focal  Abnormality area of back pain is r buttocks  Gait ol  PSYCH: pleasant and cooperative, no obvious depression or anxiety ua 2+ leuk  ASSESSMENT AND PLAN:  Discussed the following assessment and plan:  Pseudomonas urinary tract infection quin resistant - r to quinolones  seems lower tract  dsic with dr Baxter Flattery  try fosfomycine 3 gram and if not better mayneed iv op rx. if sx continue  UTI (lower urinary tract infection) - psudomonas resistant to wuinolones  will disc with id consdier urology   History of UTI - Plan: POC Urinalysis Dipstick  Nocturnal leg cramps Total visit 49mins > 50% spent counseling and coordinating care as indicated in above note and in instructions to patient .  -Patient advised to return or notify health care team  if symptoms worsen ,persist or new concerns arise.  Patient Instructions  You have a germ that is resistant to the oral antibiotics tested ( as opposed to  Injectables )  I will contact ID about best way to treat.     And then plan fu   COntact  Back specialist about  ?  Leg cramps .        Standley Brooking. Panosh M.D.  Disc with dr Baxter Flattery  Will rx  fosomaycin  And have her fu call of recheck in a week or as needed  Contacted patient by phone  Med sent in and plan for fu if not better. Iv infusion may be needed if not better

## 2015-10-09 ENCOUNTER — Other Ambulatory Visit: Payer: Self-pay | Admitting: Family Medicine

## 2015-10-09 ENCOUNTER — Telehealth: Payer: Self-pay | Admitting: Internal Medicine

## 2015-10-09 DIAGNOSIS — N390 Urinary tract infection, site not specified: Principal | ICD-10-CM

## 2015-10-09 DIAGNOSIS — B965 Pseudomonas (aeruginosa) (mallei) (pseudomallei) as the cause of diseases classified elsewhere: Secondary | ICD-10-CM

## 2015-10-09 NOTE — Telephone Encounter (Signed)
Please have her get repeat  UA and urine cultures test I will have to call ID  further advice

## 2015-10-09 NOTE — Telephone Encounter (Signed)
Left a message informing the pt that she does not need to see Dr. Regis Bill on 10/10/15.  She will need to go to the lab and leave urine sample.  Dr. Regis Bill to call ID if necessary to consult about further treatment based on results.  Instructed the pt to call back if any questions.  Appt changed in the system.

## 2015-10-09 NOTE — Telephone Encounter (Signed)
See below

## 2015-10-09 NOTE — Telephone Encounter (Signed)
[facility] - Client [place]  Patient Name: [name]  DOB: 1941/08/21    Initial Comment Caller states, dx UTI , Cipro did not help, on Friday she was given a powder form of antibiotic, she still has Sx, she has frequency and pressure    Nurse Assessment  Nurse: Wisdom, RN, Susie Date/Time (Eastern Time): 10/09/2015 11:25:24 AM  Confirm and document reason for call. If symptomatic, describe symptoms. You must click the next button to save text entered. ---recent diagnosed with UTI and the Cipro - began on January 27; she took this for 3 days; returned on next Thursday - and culture results were available and medication was changed powdered antibiotic Phosphamycin; urinary pressure and frequency continues - no foul odor to urine;  Has the patient traveled out of the country within the last 30 days? ---Not Applicable  Does the patient have any new or worsening symptoms? ---Yes  Will a triage be completed? ---Yes  Related visit to physician within the last 2 weeks? ---Yes  Does the PT have any chronic conditions? (i.e. diabetes, asthma, etc.) ---No  Is this a behavioral health or substance abuse call? ---No     Guidelines    Guideline Title Affirmed Question Affirmed Notes  Urinary Tract Infection on Antibiotic Follow-up Call - Female [1] Taking antibiotic > 72 hours (3 days) for UTI AND [2] painful urination or frequency not improved    Final Disposition User   See Physician within 24 Hours Wisdom, RN, Susie    Referrals  REFERRED TO PCP OFFICE   Disagree/Comply: Comply

## 2015-10-10 ENCOUNTER — Encounter: Payer: Self-pay | Admitting: Internal Medicine

## 2015-10-10 ENCOUNTER — Other Ambulatory Visit (INDEPENDENT_AMBULATORY_CARE_PROVIDER_SITE_OTHER): Payer: Medicare Other

## 2015-10-10 DIAGNOSIS — N390 Urinary tract infection, site not specified: Secondary | ICD-10-CM

## 2015-10-10 DIAGNOSIS — B965 Pseudomonas (aeruginosa) (mallei) (pseudomallei) as the cause of diseases classified elsewhere: Secondary | ICD-10-CM

## 2015-10-10 LAB — POC URINALSYSI DIPSTICK (AUTOMATED)
Bilirubin, UA: NEGATIVE
Blood, UA: NEGATIVE
Glucose, UA: NEGATIVE
Ketones, UA: NEGATIVE
Nitrite, UA: NEGATIVE
Spec Grav, UA: 1.02
Urobilinogen, UA: 0.2
pH, UA: 7

## 2015-10-11 NOTE — Telephone Encounter (Signed)
Dr Baxter Flattery  Please review I will put in order for ID consult in the interim .  Fu culture is pending  Can you see her this week ?

## 2015-10-12 ENCOUNTER — Encounter: Payer: Self-pay | Admitting: Internal Medicine

## 2015-10-12 ENCOUNTER — Telehealth: Payer: Self-pay | Admitting: Internal Medicine

## 2015-10-12 NOTE — Telephone Encounter (Signed)
I contacted patient. She states that she just wanted an update on the referral - I notified her that Dr. Regis Bill & Rojelio Brenner are out of the office. I can see that Dr. Regis Bill has tplaced the referral - I notified patient of that. Patient verbalized understanding.

## 2015-10-12 NOTE — Telephone Encounter (Signed)
Pt would like you to call her asap to update you on what happened at [facility] disease center.

## 2015-10-13 ENCOUNTER — Other Ambulatory Visit: Payer: Self-pay

## 2015-10-13 ENCOUNTER — Encounter: Payer: Self-pay | Admitting: Internal Medicine

## 2015-10-13 ENCOUNTER — Other Ambulatory Visit: Payer: Self-pay | Admitting: Family Medicine

## 2015-10-13 LAB — URINE CULTURE

## 2015-10-13 MED ORDER — FOSFOMYCIN TROMETHAMINE 3 G PO PACK: PACK | ORAL | Status: DC

## 2015-10-14 NOTE — Telephone Encounter (Signed)
See previous notes.

## 2015-10-14 NOTE — Telephone Encounter (Addendum)
Spoke with patient yesterday and dr Baxter Flattery ucx dec to 20 K org pending  Thought that  Partial response  Will rx fosomycin qod x 3 and then assess.  I spoke with patient directly yesterday.

## 2015-10-15 ENCOUNTER — Encounter: Payer: Self-pay | Admitting: Internal Medicine

## 2015-10-16 ENCOUNTER — Other Ambulatory Visit: Payer: Self-pay

## 2015-10-16 MED ORDER — FOSFOMYCIN TROMETHAMINE 3 G PO PACK: PACK | ORAL | Status: DC

## 2015-10-20 ENCOUNTER — Telehealth: Payer: Self-pay | Admitting: Internal Medicine

## 2015-10-20 ENCOUNTER — Encounter: Payer: Self-pay | Admitting: Internal Medicine

## 2015-10-20 NOTE — Telephone Encounter (Signed)
Will need to defer to ID  Dr Dereck Ligas  Please contact her and    Define her sx.  soe we can get message to ID thanks

## 2015-10-20 NOTE — Telephone Encounter (Signed)
Pt notified that ID clinic to call

## 2015-10-20 NOTE — Telephone Encounter (Signed)
Pt call to say her UTI has not gone away and have been getting treatment for it for about 3 weeks. Would like a call back as to what is the next step

## 2015-10-20 NOTE — Telephone Encounter (Signed)
i will call her and get her into clinic to finish out work up

## 2015-10-23 ENCOUNTER — Encounter: Payer: Self-pay | Admitting: Internal Medicine

## 2015-10-24 NOTE — Telephone Encounter (Signed)
Thanks for th update   Will send her a message   Cant tell what happened by ehr   Last information was that someone would contact you .

## 2015-10-26 ENCOUNTER — Other Ambulatory Visit: Payer: Self-pay | Admitting: Family Medicine

## 2015-10-26 ENCOUNTER — Encounter: Payer: Self-pay | Admitting: Internal Medicine

## 2015-10-26 DIAGNOSIS — B965 Pseudomonas (aeruginosa) (mallei) (pseudomallei) as the cause of diseases classified elsewhere: Secondary | ICD-10-CM

## 2015-10-26 DIAGNOSIS — N390 Urinary tract infection, site not specified: Principal | ICD-10-CM

## 2015-10-26 NOTE — Telephone Encounter (Signed)
I dont know why no  one has contacted her .  Please  Have her get another  UA and urine culture today . We will need to make a plan for her .  how are her symptoms now.

## 2015-10-27 ENCOUNTER — Other Ambulatory Visit: Payer: Self-pay | Admitting: Internal Medicine

## 2015-10-27 ENCOUNTER — Other Ambulatory Visit (INDEPENDENT_AMBULATORY_CARE_PROVIDER_SITE_OTHER): Payer: Medicare Other

## 2015-10-27 DIAGNOSIS — B965 Pseudomonas (aeruginosa) (mallei) (pseudomallei) as the cause of diseases classified elsewhere: Secondary | ICD-10-CM

## 2015-10-27 DIAGNOSIS — N390 Urinary tract infection, site not specified: Secondary | ICD-10-CM | POA: Diagnosis not present

## 2015-10-27 LAB — POC URINALSYSI DIPSTICK (AUTOMATED)
Bilirubin, UA: NEGATIVE
Blood, UA: NEGATIVE
Glucose, UA: NEGATIVE
Ketones, UA: NEGATIVE
Nitrite, UA: NEGATIVE
Protein, UA: NEGATIVE
Spec Grav, UA: 1.015
Urobilinogen, UA: 0.2
pH, UA: 5.5

## 2015-10-27 MED ORDER — FOSFOMYCIN TROMETHAMINE 3 G PO PACK: PACK | ORAL | Status: DC

## 2015-10-29 LAB — URINE CULTURE: Colony Count: 40000

## 2015-10-30 ENCOUNTER — Telehealth: Payer: Self-pay | Admitting: *Deleted

## 2015-10-30 DIAGNOSIS — R300 Dysuria: Secondary | ICD-10-CM

## 2015-10-30 NOTE — Telephone Encounter (Signed)
Patient left message asking for the time on Tuesday with Dr. Baxter Flattery. Per chart review, patient gave urine culture on 2/24, but was noted that it needed recollection.  Patient took one dose of fosfomycin on Saturday, reports no symptoms today. Per Dr. Baxter Flattery, patient will come tomorrow to give clean-catch urine sample, will follow up on Thursday (overbook ok).  RN explained how to provide clean-catch. Patient verbalized understanding, agreement. Landis Gandy, RN

## 2015-10-31 ENCOUNTER — Other Ambulatory Visit (INDEPENDENT_AMBULATORY_CARE_PROVIDER_SITE_OTHER): Payer: Medicare Other

## 2015-10-31 DIAGNOSIS — R300 Dysuria: Secondary | ICD-10-CM

## 2015-10-31 NOTE — Addendum Note (Signed)
Addended by: Dolan Amen D on: 10/31/2015 11:33 AM   Modules accepted: Orders

## 2015-11-01 LAB — URINALYSIS, MICROSCOPIC ONLY
Bacteria, UA: NONE SEEN [HPF]
Casts: NONE SEEN [LPF]
Crystals: NONE SEEN [HPF]
RBC / HPF: NONE SEEN RBC/HPF (ref <=2)
Squamous Epithelial / LPF: NONE SEEN [HPF] (ref <=5)
WBC, UA: NONE SEEN WBC/HPF (ref <=5)
Yeast: NONE SEEN [HPF]

## 2015-11-01 LAB — URINALYSIS, ROUTINE W REFLEX MICROSCOPIC
Bilirubin Urine: NEGATIVE
Glucose, UA: NEGATIVE
Hgb urine dipstick: NEGATIVE
Ketones, ur: NEGATIVE
Nitrite: NEGATIVE
Protein, ur: NEGATIVE
Specific Gravity, Urine: 1.01 (ref 1.001–1.035)
pH: 6.5 (ref 5.0–8.0)

## 2015-11-02 ENCOUNTER — Ambulatory Visit (INDEPENDENT_AMBULATORY_CARE_PROVIDER_SITE_OTHER): Payer: Medicare Other | Admitting: Internal Medicine

## 2015-11-02 ENCOUNTER — Encounter: Payer: Self-pay | Admitting: Internal Medicine

## 2015-11-02 ENCOUNTER — Telehealth: Payer: Self-pay

## 2015-11-02 VITALS — BP 120/82 | HR 81 | Temp 98.1°F | Wt 102.0 lb

## 2015-11-02 DIAGNOSIS — N3000 Acute cystitis without hematuria: Secondary | ICD-10-CM | POA: Diagnosis not present

## 2015-11-02 LAB — CBC WITH DIFFERENTIAL/PLATELET
Basophils Absolute: 0.1 10*3/uL (ref 0.0–0.1)
Basophils Relative: 1 % (ref 0–1)
Eosinophils Absolute: 0.5 10*3/uL (ref 0.0–0.7)
Eosinophils Relative: 6 % — ABNORMAL HIGH (ref 0–5)
HCT: 35.4 % — ABNORMAL LOW (ref 36.0–46.0)
Hemoglobin: 11.8 g/dL — ABNORMAL LOW (ref 12.0–15.0)
Lymphocytes Relative: 32 % (ref 12–46)
Lymphs Abs: 2.4 10*3/uL (ref 0.7–4.0)
MCH: 33.4 pg (ref 26.0–34.0)
MCHC: 33.3 g/dL (ref 30.0–36.0)
MCV: 100.3 fL — ABNORMAL HIGH (ref 78.0–100.0)
MPV: 9.1 fL (ref 8.6–12.4)
Monocytes Absolute: 0.6 10*3/uL (ref 0.1–1.0)
Monocytes Relative: 8 % (ref 3–12)
Neutro Abs: 4 10*3/uL (ref 1.7–7.7)
Neutrophils Relative %: 53 % (ref 43–77)
Platelets: 275 10*3/uL (ref 150–400)
RBC: 3.53 MIL/uL — ABNORMAL LOW (ref 3.87–5.11)
RDW: 13.8 % (ref 11.5–15.5)
WBC: 7.6 10*3/uL (ref 4.0–10.5)

## 2015-11-02 LAB — BASIC METABOLIC PANEL
BUN: 14 mg/dL (ref 7–25)
CO2: 28 mmol/L (ref 20–31)
Calcium: 10.1 mg/dL (ref 8.6–10.4)
Chloride: 99 mmol/L (ref 98–110)
Creat: 0.67 mg/dL (ref 0.60–0.93)
Glucose, Bld: 89 mg/dL (ref 65–99)
Potassium: 4.9 mmol/L (ref 3.5–5.3)
Sodium: 135 mmol/L (ref 135–146)

## 2015-11-02 MED ORDER — FOSFOMYCIN TROMETHAMINE 3 G PO PACK: PACK | ORAL | Status: DC

## 2015-11-02 NOTE — Progress Notes (Addendum)
Subjective:    Patient ID: Kaitlyn Flowers, female    DOB: 11-14-1940, 75 y.o.   MRN: [id number]  HPI 75yo F with hx of spinal stenosis, spinal cord tumor, who reports having more frequent urinary tract infections < last year. Previously would have UTI no more than once every few years. She was treated this past Sept for pseudomonal UTI with oral cipro which treated the symptoms quickly, however for the past month she has had urinary symptoms of being Uncomfortable, urgency to urinate. No dysuria, bladder pressure.that would worsen with sharp lower abdominal pain. She was seen by her PCP who did ua and urine cx that showed >100,000 PsA, now resistant to cipro. Did empiric treatment with fosfomycin x 1 dose which worked initially but within 5 days has similar symptoms. She was then treated for 3 doses, QOD which did provide relief for roughly 2 wk. In the last week, she started to have recurrence of symptoms. Repeat UA showed on 20,000 PsA, where she was not retreated. Symptoms continued to progress, so repeat ur cx (mixed flora) done 6 days ago in addition to empiric dose of fosfomycin x 1 which helped, but started to feel more pressure yesterday. She has repeat urine cx pending  She has no history of nephrolithiasis, nor incontinence  I have reviewed past micro results  She is very fit and active for her age, and feels her current symptoms impact her quality of life   Allergies  Allergen Reactions  . Ibuprofen-Famotidine Other (See Comments)    Elevated BP  . Penicillins Rash   Current Outpatient Prescriptions on File Prior to Visit  Medication Sig Dispense Refill  . b complex vitamins capsule Take 1 capsule by mouth daily.    Carin Hock Iron (PERFECT IRON) 25 MG TABS Take 25 mg by mouth daily.    . Cholecalciferol (VITAMIN D3) 5000 UNITS CAPS Take 1 capsule by mouth daily.    . pregabalin (LYRICA) 25 MG capsule Take 1 capsule (25 mg total) by mouth 2 (two) times daily. 180 capsule 0    . fosfomycin (MONUROL) 3 g PACK Mix and take x 1 po (Patient not taking: Reported on 11/02/2015) 1 packet 0   No current facility-administered medications on file prior to visit.   Active Ambulatory Problems    Diagnosis Date Noted  . NEOPLASM, SKIN, UNCERTAIN BEHAVIOR [id number]  . HYPERKALEMIA 05/12/2007  . ANEMIA NOS 05/12/2007  . DISORDER, STOMACH FUNCTION NEC 04/07/2007  . ABDOMINAL BRUIT 07/21/2009  . Back pain 11/23/2010  . 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 Medicare annual wellness visit, initial 12/18/2010  . Wears hearing aid 12/18/2010  . Medicare welcome visit 12/18/2010  . Urinary frequency 09/02/2011  . Dysuria 09/02/2011  . 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/11/2011  . Fatigue 11/11/2011  . 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 Plantar wart of right foot 12/29/2011  . Back pain with radiation 06/12/2012  . Glucosuria 12/16/2012  . Tick bite of upper arm 12/16/2012  . UTI (urinary tract infection) 12/16/2012  . Embedded tick of upper arm 12/16/2012  . Osteoporosis 12/26/2013  . Right low back pain 01/07/2014  . Anemia 01/07/2014  . Sacroiliitis ([place]) 06/06/2014  . Lumbar radicular pain 06/06/2014  . 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 UTI (lower urinary tract infection) 10/02/2015   Resolved Ambulatory Problems    Diagnosis Date Noted  . BLURRED VISION 04/07/2007  . EYE PAIN 04/07/2007  . Acute UTI 09/27/2011  Past Medical History  Diagnosis Date  . Osteopenia   . PONV (postoperative nausea and vomiting)    Social History  Substance Use Topics  . Smoking status: Former Smoker -- 0.25 packs/day for 2 years    Types: Cigarettes    Quit date: 09/03/1967  . Smokeless tobacco: None  . Alcohol Use: 2.4 oz/week    4 Glasses of wine per week  family history includes Heart attack in her father; Stroke in her mother. There is no history of Anesthesia problems.  Review of Systems Urinary pressure and urgency,  otherwise no fever, chills, nightsweats or flank pain . + chronic back pain, possibly getting evaluated for spinal cord nerve stimulator    Objective:   Physical Exam BP 120/82 mmHg  Pulse 81  Temp(Src) 98.1 F (36.7 C) (Oral)  Wt 102 lb (46.267 kg) Physical Exam  Constitutional:  oriented to person, place, and time. appears well-developed and well-nourished. No distress.  HENT: [place]/AT, PERRLA, no scleral icterus Mouth/Throat: Oropharynx is clear and moist. No oropharyngeal exudate.  Cardiovascular: Normal rate, regular rhythm and normal heart sounds. Exam reveals no gallop and no friction rub.  No murmur heard.  Pulmonary/Chest: Effort normal and breath sounds normal. No respiratory distress.  has no wheezes.  Neck = supple, no nuchal rigidity Abdominal: Soft. Bowel sounds are normal.  exhibits no distension. There is no tenderness.  Lymphadenopathy: no cervical adenopathy. No axillary adenopathy Neurological: alert and oriented to person, place, and time.  Skin: Skin is warm and dry. No rash noted. No erythema.  Psychiatric: a normal mood and affect.  behavior is normal.      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 Fosfomycin has moderate S to pseudomonas but may not help completely.  We will plan on placing picc line for 7 day course of cefepime to treat complicated uti with pseudomonas.  Most recent ua and urine culture on 2/28 shows leukocytosis no nitrates, no WBC<- though some growth of culture. Will follow up on culture to see if still having pseudomonas vs other organism  If she has little response to IV abtx or recurrence < 3 months after treatment, will refer to urology to see if she has urinary reflux or urodynamic abn that would make her prone to recurrent uti.  Patient may suffer for vaginal mucosa atrophy that can make her prone to recurrent uti. Will do a trial of 0.5 mg of estriol in vaginal cream to be used once each night for two weeks followed by  twice-weekly applications for eight months, after she finishes IV abtx.  Spent 45 min with patient explaining course of treatment and options for prevention

## 2015-11-02 NOTE — Telephone Encounter (Signed)
Notified patient via telephone that she does not qualify for [facility] but will be referred to [facility] for infusion services. Also, explained that the office will contact her once we have an appt for the PICC line placement.

## 2015-11-03 ENCOUNTER — Other Ambulatory Visit: Payer: Self-pay | Admitting: Internal Medicine

## 2015-11-03 ENCOUNTER — Encounter (HOSPITAL_COMMUNITY)
Admission: RE | Admit: 2015-11-03 | Discharge: 2015-11-03 | Disposition: A | Discharge status: Home / Self Care | Payer: Medicare Other | Source: Ambulatory Visit | Attending: Internal Medicine | Admitting: Internal Medicine

## 2015-11-03 ENCOUNTER — Ambulatory Visit (HOSPITAL_COMMUNITY): Admission: RE | Admit: 2015-11-03 | Payer: Medicare Other | Source: Ambulatory Visit

## 2015-11-03 ENCOUNTER — Telehealth: Payer: Self-pay | Admitting: *Deleted

## 2015-11-03 ENCOUNTER — Ambulatory Visit (HOSPITAL_COMMUNITY)
Admission: RE | Admit: 2015-11-03 | Discharge: 2015-11-03 | Disposition: A | Discharge status: Home / Self Care | Payer: Medicare Other | Source: Ambulatory Visit | Attending: Internal Medicine | Admitting: Internal Medicine

## 2015-11-03 DIAGNOSIS — N3000 Acute cystitis without hematuria: Secondary | ICD-10-CM | POA: Insufficient documentation

## 2015-11-03 DIAGNOSIS — B965 Pseudomonas (aeruginosa) (mallei) (pseudomallei) as the cause of diseases classified elsewhere: Secondary | ICD-10-CM | POA: Diagnosis not present

## 2015-11-03 DIAGNOSIS — N390 Urinary tract infection, site not specified: Secondary | ICD-10-CM | POA: Diagnosis present

## 2015-11-03 MED ORDER — HEPARIN SOD (PORK) LOCK FLUSH 100 UNIT/ML IV SOLN
500.0000 [IU] | Freq: Once | INTRAVENOUS | Status: AC
  Administered 2015-11-03: 250 [IU] via INTRAVENOUS

## 2015-11-03 MED ORDER — DEXTROSE 5 % IV SOLN
1.0000 g | Freq: Once | INTRAVENOUS | Status: AC
  Administered 2015-11-03: 1 g via INTRAVENOUS
  Filled 2015-11-03: qty 1

## 2015-11-03 MED ORDER — LIDOCAINE HCL 1 % IJ SOLN
INTRAMUSCULAR | Status: AC
  Filled 2015-11-03: qty 20

## 2015-11-03 MED ORDER — HEPARIN SOD (PORK) LOCK FLUSH 100 UNIT/ML IV SOLN
INTRAVENOUS | Status: AC
  Filled 2015-11-03: qty 5

## 2015-11-03 NOTE — Telephone Encounter (Signed)
Patient had picc placed this morning and received her first dose of cefepime at [facility] short stay. She called back and left a voice mail stating she is not comfortable with administering her second dose of antibiotic. Spoke to Dr. Baxter Flattery and she said to wait until [place] speaks with patient in the AM. She feels it is a fairly easy procedure and there is not another alternative for this medication due to it being twice daily. She will be having the picc pulled after seven days. Myrtis Hopping

## 2015-11-03 NOTE — Telephone Encounter (Signed)
Spoke to [place] at [facility] and the orders have been faxed to [phone], confirmation received. Was told they should be able to set her up with nursing, just need to verify her insurance. Otila Kluver to call back today, once this is done.

## 2015-11-03 NOTE — Telephone Encounter (Signed)
[facility] unable to provide home ABX administration.  Pt does not meet "homebound" status for Medicare.  Declined service by [facility].  Will forward Dr. Storm Frisk order to [place] for consideration.

## 2015-11-04 LAB — URINE CULTURE: Colony Count: >=100000

## 2015-11-06 ENCOUNTER — Telehealth: Payer: Self-pay

## 2015-11-06 NOTE — Telephone Encounter (Signed)
Called pt to follow-up with her to see how the antibiotic administration is going in the home. Pt stated that it was going well and that she has administrating her medications herself since Saturday. She asked about labs needing to be done. Told her since she has an appointment that blood would possibly be drawn then. Pt also asked about PICC line removal. Explained once appointment was done that Dr. Baxter Flattery will make the decision for line removal. Patient satisfied.

## 2015-11-07 ENCOUNTER — Ambulatory Visit (HOSPITAL_COMMUNITY): Payer: Medicare Other

## 2015-11-08 ENCOUNTER — Encounter: Payer: Self-pay | Admitting: Internal Medicine

## 2015-11-08 ENCOUNTER — Ambulatory Visit (INDEPENDENT_AMBULATORY_CARE_PROVIDER_SITE_OTHER): Payer: Medicare Other | Admitting: Internal Medicine

## 2015-11-08 ENCOUNTER — Telehealth: Payer: Self-pay | Admitting: *Deleted

## 2015-11-08 VITALS — BP 121/71 | HR 92 | Temp 98.1°F | Wt 101.0 lb

## 2015-11-08 DIAGNOSIS — N390 Urinary tract infection, site not specified: Secondary | ICD-10-CM | POA: Diagnosis not present

## 2015-11-08 MED ORDER — NITROFURANTOIN MACROCRYSTAL 100 MG PO CAPS: 100.0000 mg | ORAL_CAPSULE | Freq: Four times a day (QID) | ORAL | Status: DC

## 2015-11-08 NOTE — Progress Notes (Signed)
  RFV: uti Subjective:    Patient ID: Kaitlyn Flowers, female    DOB: 04-11-1941, 75 y.o.   MRN: [id number]  HPI 60YT F with complicated urinary tract infection with PsA, possibly enterococcus. Patient not having improved symptoms despite 5th day of abtx.  Allergies  Allergen Reactions  . Ibuprofen-Famotidine Other (See Comments)    Elevated BP  . Penicillins Rash   meds reviewed Active Ambulatory Problems    Diagnosis Date Noted  . NEOPLASM, SKIN, UNCERTAIN BEHAVIOR 24/46/2863  . HYPERKALEMIA 05/12/2007  . ANEMIA NOS 05/12/2007  . DISORDER, STOMACH FUNCTION NEC 04/07/2007  . ABDOMINAL BRUIT 07/21/2009  . Back pain 11/23/2010  . 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 Medicare annual wellness visit, initial 12/18/2010  . Wears hearing aid 12/18/2010  . Medicare welcome visit 12/18/2010  . Urinary frequency 09/02/2011  . Dysuria 09/02/2011  . 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/11/2011  . Fatigue 11/11/2011  . 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 Plantar wart of right foot 12/29/2011  . Back pain with radiation 06/12/2012  . Glucosuria 12/16/2012  . Tick bite of upper arm 12/16/2012  . UTI (urinary tract infection) 12/16/2012  . Embedded tick of upper arm 12/16/2012  . Osteoporosis 12/26/2013  . Right low back pain 01/07/2014  . Anemia 01/07/2014  . Sacroiliitis ([place]) 06/06/2014  . Lumbar radicular pain 06/06/2014  . 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 UTI (lower urinary tract infection) 10/02/2015   Resolved Ambulatory Problems    Diagnosis Date Noted  . BLURRED VISION 04/07/2007  . EYE PAIN 04/07/2007  . Acute UTI 09/27/2011   Past Medical History  Diagnosis Date  . Osteopenia   . PONV (postoperative nausea and vomiting)       Review of Systems Irritation at picc line dressing    Objective:   Physical Exam  BP 121/71 mmHg  Pulse 92  Temp(Src) 98.1 F (36.7 C) (Oral)  Wt 101 lb (45.813 kg) gen = a xo by 3  in NAD abd = ntnd, no flank tenderness Ext: c/d/i picc line     Assessment & Plan:  - will give her rx for nitrofurantoin to treat enterococcal infection since she has pcn allergy, and sensitive stomach so doxy would also not be well tolerated  PsA UTI = continue to finish up course , and anticipate to have picc line pulled on Friday  abtx assoc diarrhea= if worsens, will test for cdiff. Will give her a stool kit. Can use imodium to help with symptoms

## 2015-11-08 NOTE — Telephone Encounter (Signed)
Patient called and advised she has been having sever diarrhea and is not taking the cefepime anymore. She wants the doctor to prescribe a different antibiotic if possible. Advised her doctor is not in the office at this time but I will send her a message to have her look at the situation and someone will give her a call back asap.  Spoke with Dr Baxter Flattery and was advised to have the patient come to clinic today for evaluation.  Patient called and given an appt for 11/08/15 at 145 pm.

## 2015-11-10 ENCOUNTER — Other Ambulatory Visit: Payer: Medicare Other

## 2015-11-10 ENCOUNTER — Telehealth: Payer: Self-pay | Admitting: *Deleted

## 2015-11-10 ENCOUNTER — Other Ambulatory Visit: Payer: Self-pay | Admitting: *Deleted

## 2015-11-10 DIAGNOSIS — R309 Painful micturition, unspecified: Secondary | ICD-10-CM

## 2015-11-10 LAB — URINALYSIS, ROUTINE W REFLEX MICROSCOPIC
Bilirubin Urine: NEGATIVE
Glucose, UA: NEGATIVE
Hgb urine dipstick: NEGATIVE
Ketones, ur: NEGATIVE
Leukocytes, UA: NEGATIVE
Nitrite: NEGATIVE
Protein, ur: NEGATIVE
Specific Gravity, Urine: 1.012 (ref 1.001–1.035)
pH: 6 (ref 5.0–8.0)

## 2015-11-10 NOTE — Telephone Encounter (Signed)
Called the patient to check and see how she was responding to medication. She advised yesterday 11/09/15 she felt better and this morning it has been rough. She advised the burning sensation has come back and she vomited when she took her medication. Advised her to bring in a urine sample so that we can test it and have results for her appt on Monday 11/13/15 the patient advised she has an appt this morning in town and will drop a urine off this morning. Advised will let Dr Baxter Flattery know and to continue with the medication with a meal and we will see her at appt.

## 2015-11-11 LAB — URINE CULTURE
Colony Count: NO GROWTH
Organism ID, Bacteria: NO GROWTH

## 2015-11-13 ENCOUNTER — Ambulatory Visit (INDEPENDENT_AMBULATORY_CARE_PROVIDER_SITE_OTHER): Payer: Medicare Other | Admitting: Internal Medicine

## 2015-11-13 ENCOUNTER — Encounter: Payer: Self-pay | Admitting: Internal Medicine

## 2015-11-13 VITALS — BP 134/74 | HR 87 | Temp 97.4°F | Wt 102.0 lb

## 2015-11-13 DIAGNOSIS — R300 Dysuria: Secondary | ICD-10-CM

## 2015-11-13 NOTE — Progress Notes (Signed)
Subjective:    Patient ID: Kaitlyn Flowers, female    DOB: 1941/03/15, 75 y.o.   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 She has finished 7d course of cefepime. Since she was still symptomatic after 3 days of cefepime, we had started her one nitrofurantoin since she had a urine culture that showed amp S enterococcus, from a previous ur cx though few leukocytosis. She still is having discomfort despite nearly finishing nitrofurantoin and also finishing her cefepime.  On 3/10, we asked her to give another urine specimen which showed no growth, plus no leukocytosis.  the patient reports having sharp needle like pains to lower abdomen, urinary urgency with some frequency. She reports right side pain radiating from lumbar area to right upper quadrant but not necessarily having pain during urination, she denies any colic like pain.  She believes that her right sided back pain is different from her baseline. She reports that she was last had any imaging of spine roughly 2 years ago.  Current Outpatient Prescriptions on File Prior to Visit  Medication Sig Dispense Refill  . b complex vitamins capsule Take 1 capsule by mouth daily.    Carin Hock Iron (PERFECT IRON) 25 MG TABS Take 25 mg by mouth daily.    . Cholecalciferol (VITAMIN D3) 5000 UNITS CAPS Take 1 capsule by mouth daily.    . fosfomycin (MONUROL) 3 g PACK Mix and take x 1 po 1 packet 0  . NALTREXONE HCL PO Take 4.5 mg by mouth 1 day or 1 dose.    . nitrofurantoin (MACRODANTIN) 100 MG capsule Take 1 capsule (100 mg total) by mouth 4 (four) times daily. 14 capsule 0  . pregabalin (LYRICA) 25 MG capsule Take 1 capsule (25 mg total) by mouth 2 (two) times daily. 180 capsule 0   No current facility-administered medications on file prior to visit.   Active Ambulatory Problems    Diagnosis Date Noted  . NEOPLASM, SKIN, UNCERTAIN  BEHAVIOR [id number]  . HYPERKALEMIA 05/12/2007  . ANEMIA NOS 05/12/2007  . DISORDER, STOMACH FUNCTION NEC 04/07/2007  . ABDOMINAL BRUIT 07/21/2009  . Back pain 11/23/2010  . 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 Medicare annual wellness visit, initial 12/18/2010  . Wears hearing aid 12/18/2010  . Medicare welcome visit 12/18/2010  . Urinary frequency 09/02/2011  . Dysuria 09/02/2011  . 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/11/2011  . Fatigue 11/11/2011  . 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 Plantar wart of right foot 12/29/2011  . Back pain with radiation 06/12/2012  . Glucosuria 12/16/2012  . Tick bite of upper arm 12/16/2012  . UTI (urinary tract infection) 12/16/2012  . Embedded tick of upper arm 12/16/2012  . Osteoporosis 12/26/2013  . Right low back pain 01/07/2014  . Anemia 01/07/2014  . Sacroiliitis ([place]) 06/06/2014  . Lumbar radicular pain 06/06/2014  . 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 UTI (lower urinary tract infection) 10/02/2015   Resolved Ambulatory Problems    Diagnosis Date Noted  . BLURRED VISION 04/07/2007  . EYE PAIN 04/07/2007  . Acute UTI 09/27/2011   Past Medical History  Diagnosis Date  . Osteopenia   . PONV (postoperative nausea and vomiting)    Social History  Substance Use Topics  . Smoking status: Former Smoker -- 0.25 packs/day for 2 years    Types: Cigarettes  Quit date: 09/03/1967  . Smokeless tobacco: None  . Alcohol Use: 2.4 oz/week    4 Glasses of wine per week     Review of Systems Positive pertinents listed on hpi. Otherwise 10 point ros is negative    Objective:   Physical Exam  BP 134/74 mmHg  Pulse 87  Temp(Src) 97.4 F (36.3 C) (Oral)  Wt 102 lb (46.267 kg)  gen = a xo by 3 in nad abd = soft, bs+ no RUQ     Assessment & Plan:  Recurrent PsA UTi iniitally treated with fosfomycin extended course x 2, followed by IV cefepime since patient was still having symptoms.  Mid-treatment urine cx showed enterococcus, though it did not have significant pyuria. Nitrofurantoin was added without significant improvement of symptoms. At end of treatment course on 3/310 she had repeat urine cx which showed no growth (though still the presence of low pelvic needle like sensation, urinary urgency but difficulty initiating her stream +/- new right sided back pain radiating to RUQ)  I am concerend that her constellation of symptoms are not necessarily due to infection. She has been treated appropriately covering both pseudomonas and enterococcus without significant improvement of symptoms. Test of cure shows no growth on urine culture.   We reached out to urology for set up appt for 3/21 next Tuesday at 1:15 pm with dr. Karsten Ro  For work up of any element of bladder dysfunction, detrusor weakness to account for urianry symptoms. She may need imaging to work up back/radiating pain that could be lumbar disease, radiating pain.  Will discuss with Dr Regis Bill

## 2015-11-14 ENCOUNTER — Other Ambulatory Visit: Payer: Medicare Other

## 2015-11-19 NOTE — Progress Notes (Signed)
Pre visit review using our clinic review tool, if applicable. No additional management support is needed unless otherwise documented below in the visit note.  Chief Complaint  Patient presents with  . Medicare Wellness    HPI: Kaitlyn Flowers 75 y.o. comes in today for Preventive Medicare wellness visit .Since last visit.   Had   rx   For ristanct pseudomonoas and enterococcus   And urine sterile   2  utis   sterile and clear now still has some sx.  To see urology tomorrow.  shotting sharp pains are gone but still has  Lower pelvid urine sx   Back pain on going with radiation  per dr Ellene Route.  Last month   Injection not helping .  And referred  To pain management  And considering  Spinal  Stimulator .  Advised and  Hesitant.  Tens  Unit didn't help that much but  Consider  different kind.  To try  This is problematic as far as quality of life lyrica 25 helps sleep but so groggy doesn't really help pain  .   Exercise helped right leg  Numbness and weakness .  Parents died in mid 40s  Gf in 50  Health Maintenance  Topic Date Due  . MAMMOGRAM  12/24/2007  . PNA vac Low Risk Adult (2 of 2 - PCV13) 12/18/2011  . INFLUENZA VACCINE  04/02/2016  . TETANUS/TDAP  12/17/2020  . COLONOSCOPY  02/06/2021  . DEXA SCAN  Completed  . ZOSTAVAX  Completed   Health Maintenance Review LIFESTYLE:  TAD  once a day .  Sugar beverages: no Sleep: about 8 hours  Taking lyrica for back helps sleep. Yoga and walking  Back pain    MEDICARE DOCUMENT QUESTIONS  TO SCAN     Hearing: heaing aids   Vision:  No limitations at present . Last eye check UTD  Safety:  Has smoke detector and wears seat belts.  No firearms. No excess sun exposure. Sees dentist regularly.  Falls: no  Advance directive :  Reviewed  Has one.  Memory: Felt to be good  , no concern from her or her family.  Depression: No anhedonia unusual crying or depressive symptoms has  Support grief etc .   Nutrition: Eats well balanced  diet; adequate calcium and vitamin D. No swallowing chewing problems.  Injury: no major injuries in the last six months.  Other healthcare providers:  Reviewed today .  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  Partner died within past year  Preventive parameters: up-to-date  Reviewed   ADLS:   There are no problems or need for assistance  driving, feeding, obtaining food, dressing, toileting and bathing, managing money using phone. She is independent. ocass  Work     ROS:  GEN/ HEENT: No fever, significant weight changes sweats headaches vision problems hearing changes, CV/ PULM; No chest pain shortness of breath cough, syncope,edema  change in exercise tolerance. GI /GU: No adominal pain, vomiting, change in bowel habits. No blood in the stool. No significant GU symptoms. SKIN/HEME: ,no acute skin rashes suspicious lesions or bleeding. No lymphadenopathy, nodules, masses.  NEURO/ PSYCH:  No neurologic signs such as weakness numbness. No depression anxiety. IMM/ Allergy: No unusual infections.  Allergy .   REST of 12 system review negative except as per HPI   Past Medical History  Diagnosis Date  . Osteopenia   . 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  . Wears hearing aid   . PONV (postoperative nausea and vomiting)   . Anemia     Family History  Problem Relation Age of Onset  . Stroke Mother     2010  . Heart attack Father   . Anesthesia problems Neg Hx     Social History   Social History  . Marital Status: Legally Separated    Spouse Name: N/A  . Number of Children: N/A  . Years of Education: N/A   Social History Main Topics  . Smoking status: Former Smoker -- 0.25 packs/day for 2 years    Types: Cigarettes    Quit date: 09/03/1967  . Smokeless tobacco: None  . Alcohol Use: 2.4 oz/week    4 Glasses of wine per week  . Drug Use: No  . Sexual Activity: Not Asked   Other Topics Concern  . 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 b complex vitamins capsule Take 1 capsule by mouth daily.  Carin Hock Iron (PERFECT IRON) 25 MG TABS Take 25 mg by mouth daily.  . Cholecalciferol (VITAMIN D3) 5000 UNITS CAPS Take 1 capsule by mouth daily.  Marland Kitchen NALTREXONE HCL PO Take 4.5 mg by mouth 1 day or 1 dose.  . pregabalin (LYRICA) 25 MG capsule Take 1 capsule (25 mg total) by mouth 2 (two) times daily.  . [DISCONTINUED] fosfomycin (MONUROL) 3 g PACK Mix and take x 1 po  . [DISCONTINUED] nitrofurantoin (MACRODANTIN) 100 MG capsule Take 1 capsule (100 mg total) by mouth 4 (four) times daily.   No facility-administered encounter medications on file as of 11/20/2015.    EXAM:  BP 126/72 mmHg  Temp(Src) 97.7 F (36.5 C) (Oral)  Ht 5' (1.524 m)  Wt 99 lb 9.6 oz (45.178 kg)  BMI 19.45 kg/m2  Body mass index is 19.45 kg/(m^2).  Physical Exam: Vital signs reviewed PPI:RJJO is a well-developed well-nourished alert cooperative   who appears stated age in no acute distress.  HEENT: normocephalic atraumatic , Eyes: PERRL EOM's full, conjunctiva clear, Nares: paten,t no deformity discharge or tenderness., Ears: no deformity EAC's clear TMs with normal landmarks hearing aids . Mouth: clear OP, no lesions, edema.  Moist mucous membranes. Dentition in adequate repair. NECK: supple without masses, thyromegaly or bruits. CHEST/PULM:  Clear to auscultation and percussion breath sounds equal no wheeze , rales or rhonchi. No chest wall deformities or tenderness.Breast: normal by inspection . No dimpling, discharge, masses, tenderness or discharge . CV: PMI is nondisplaced, S1 S2 no gallops, murmurs, rubs. Peripheral pulses are full without delay.No JVD .  ABDOMEN: Bowel sounds normal nontender  No guard or rebound, no hepato splenomegal no CVA tenderness.     Extremtities:  No clubbing cyanosis or edema, no acute joint swelling or redness no focal atrophy NEURO:  Oriented x3, cranial nerves 3-12 appear to be intact, no obvious focal weakness,gait within normal limits  Points to lb right aera of pain  SKIN: No acute rashes normal turgor, color, no bruising or petechiae. PSYCH: Oriented, good eye contact, no obvious depression anxiety, cognition and judgment appear normal. LN: no cervical axillary inguinal adenopathy No noted deficits in memory, attention, and speech.   Lab Results  Component Value Date   WBC 7.6 11/02/2015  HGB 11.8* 11/02/2015   HCT 35.4* 11/02/2015   PLT 275 11/02/2015   GLUCOSE 89 11/02/2015   CHOL 231* 11/27/2010   TRIG 41.0 11/27/2010   HDL 101.80 11/27/2010   LDLDIRECT 110.1 11/27/2010   LDLCALC 110* 04/09/2007   ALT 19 07/11/2014   AST 21 07/11/2014   NA 135 11/02/2015   K 4.9 11/02/2015   CL 99 11/02/2015   CREATININE 0.67 11/02/2015   BUN 14 11/02/2015   CO2 28 11/02/2015   TSH 1.21 07/11/2014   INR 0.92 08/22/2012    ASSESSMENT AND PLAN:  Discussed the following assessment and plan:  Visit for preventive health examination  Medicare annual wellness visit, subsequent  History of UTI - resistant  see note dr Baxter Flattery  Lower urinary tract symptoms (LUTS)  Lumbosacral spondylosis with radiculopathy  Osteoporosis - prev consult decided not med at the time repeat dexa t 2 year mark aprikl may pre next visit  - Plan: DG Bone Density  Pain Mild anemia  Hx of same  utd on parameteres    Await uro consult and then fu  Patient Care Team: Burnis Medin, MD as PCP - General Kristeen Miss, MD (Neurosurgery) Carlyle Basques, MD as Consulting Physician (Infectious Diseases)  Patient Instructions  Plan fu depending  On urologic evaluation and explanation of the lower abd pain and  LUTS.   Would advise repeat . Cbc  Blood count .      In 3 months . Can readdress  Osteoporosis  dexa scan pre visit   May  June time     Health Maintenance, Female Adopting a healthy lifestyle and getting preventive care can go a long way to promote health and wellness. Talk with your health care provider about what schedule of regular examinations is right for you. This is a good chance for you to check in with your provider about disease prevention and staying healthy. In between checkups, there are plenty of things you can do on your own. Experts have done a lot of research about which lifestyle changes and preventive measures are most likely to keep you healthy. Ask your health care provider for more information. WEIGHT AND DIET  Eat a healthy diet  Be sure to include plenty of vegetables, fruits, low-fat dairy products, and lean protein.  Do not eat a lot of foods high in solid fats, added sugars, or salt.  Get regular exercise. This is one of the most important things you can do for your health.  Most adults should exercise for at least 150 minutes each week. The exercise should increase your heart rate and make you sweat (moderate-intensity exercise).  Most adults should also do strengthening exercises at least twice a week. This is in addition to the moderate-intensity exercise.  Maintain a healthy weight  Body mass index (BMI) is a measurement that can be used to identify possible weight problems. It estimates body fat based on height and weight. Your health care provider can help determine your BMI and help you achieve or maintain a healthy weight.  For females 81 years of age and older:   A BMI below 18.5 is considered underweight.  A BMI of 18.5 to 24.9 is normal.  A BMI of 25 to 29.9 is considered overweight.  A BMI of 30 and above is considered obese.  Watch levels of cholesterol and blood lipids  You should start having your blood tested for lipids and cholesterol at 74 years of age, then have this test  every 5 years.  You may need to have your cholesterol levels checked more often  if:  Your lipid or cholesterol levels are high.  You are older than 74 years of age.  You are at high risk for heart disease.  CANCER SCREENING   Lung Cancer  Lung cancer screening is recommended for adults 50-59 years old who are at high risk for lung cancer because of a history of smoking.  A yearly low-dose CT scan of the lungs is recommended for people who:  Currently smoke.  Have quit within the past 15 years.  Have at least a 30-pack-year history of smoking. A pack year is smoking an average of one pack of cigarettes a day for 1 year.  Yearly screening should continue until it has been 15 years since you quit.  Yearly screening should stop if you develop a health problem that would prevent you from having lung cancer treatment.  Breast Cancer  Practice breast self-awareness. This means understanding how your breasts normally appear and feel.  It also means doing regular breast self-exams. Let your health care provider know about any changes, no matter how small.  If you are in your 20s or 30s, you should have a clinical breast exam (CBE) by a health care provider every 1-3 years as part of a regular health exam.  If you are 72 or older, have a CBE every year. Also consider having a breast X-ray (mammogram) every year.  If you have a family history of breast cancer, talk to your health care provider about genetic screening.  If you are at high risk for breast cancer, talk to your health care provider about having an MRI and a mammogram every year.  Breast cancer gene (BRCA) assessment is recommended for women who have family members with BRCA-related cancers. BRCA-related cancers include:  Breast.  Ovarian.  Tubal.  Peritoneal cancers.  Results of the assessment will determine the need for genetic counseling and BRCA1 and BRCA2 testing. Cervical Cancer Your health care provider may recommend that you be screened regularly for cancer of the pelvic organs  (ovaries, uterus, and vagina). This screening involves a pelvic examination, including checking for microscopic changes to the surface of your cervix (Pap test). You may be encouraged to have this screening done every 3 years, beginning at age 34.  For women ages 79-65, health care providers may recommend pelvic exams and Pap testing every 3 years, or they may recommend the Pap and pelvic exam, combined with testing for human papilloma virus (HPV), every 5 years. Some types of HPV increase your risk of cervical cancer. Testing for HPV may also be done on women of any age with unclear Pap test results.  Other health care providers may not recommend any screening for nonpregnant women who are considered low risk for pelvic cancer and who do not have symptoms. Ask your health care provider if a screening pelvic exam is right for you.  If you have had past treatment for cervical cancer or a condition that could lead to cancer, you need Pap tests and screening for cancer for at least 20 years after your treatment. If Pap tests have been discontinued, your risk factors (such as having a new sexual partner) need to be reassessed to determine if screening should resume. Some women have medical problems that increase the chance of getting cervical cancer. In these cases, your health care provider may recommend more frequent screening and Pap tests. Colorectal Cancer  This type of  cancer can be detected and often prevented.  Routine colorectal cancer screening usually begins at 74 years of age and continues through 75 years of age.  Your health care provider may recommend screening at an earlier age if you have risk factors for colon cancer.  Your health care provider may also recommend using home test kits to check for hidden blood in the stool.  A small camera at the end of a tube can be used to examine your colon directly (sigmoidoscopy or colonoscopy). This is done to check for the earliest forms of  colorectal cancer.  Routine screening usually begins at age 31.  Direct examination of the colon should be repeated every 5-10 years through 74 years of age. However, you may need to be screened more often if early forms of precancerous polyps or small growths are found. Skin Cancer  Check your skin from head to toe regularly.  Tell your health care provider about any new moles or changes in moles, especially if there is a change in a mole's shape or color.  Also tell your health care provider if you have a mole that is larger than the size of a pencil eraser.  Always use sunscreen. Apply sunscreen liberally and repeatedly throughout the day.  Protect yourself by wearing long sleeves, pants, a wide-brimmed hat, and sunglasses whenever you are outside. HEART DISEASE, DIABETES, AND HIGH BLOOD PRESSURE   High blood pressure causes heart disease and increases the risk of stroke. High blood pressure is more likely to develop in:  People who have blood pressure in the high end of the normal range (130-139/85-89 mm Hg).  People who are overweight or obese.  People who are African American.  If you are 48-71 years of age, have your blood pressure checked every 3-5 years. If you are 65 years of age or older, have your blood pressure checked every year. You should have your blood pressure measured twice--once when you are at a hospital or clinic, and once when you are not at a hospital or clinic. Record the average of the two measurements. To check your blood pressure when you are not at a hospital or clinic, you can use:  An automated blood pressure machine at a pharmacy.  A home blood pressure monitor.  If you are between 71 years and 65 years old, ask your health care provider if you should take aspirin to prevent strokes.  Have regular diabetes screenings. This involves taking a blood sample to check your fasting blood sugar level.  If you are at a normal weight and have a low risk for  diabetes, have this test once every three years after 74 years of age.  If you are overweight and have a high risk for diabetes, consider being tested at a younger age or more often. PREVENTING INFECTION  Hepatitis B  If you have a higher risk for hepatitis B, you should be screened for this virus. You are considered at high risk for hepatitis B if:  You were born in a country where hepatitis B is common. Ask your health care provider which countries are considered high risk.  Your parents were born in a high-risk country, and you have not been immunized against hepatitis B (hepatitis B vaccine).  You have HIV or AIDS.  You use needles to inject street drugs.  You live with someone who has hepatitis B.  You have had sex with someone who has hepatitis B.  You get hemodialysis treatment.  You  take certain medicines for conditions, including cancer, organ transplantation, and autoimmune conditions. Hepatitis C  Blood testing is recommended for:  Everyone born from [birth year] through 1964.  Anyone with known risk factors for hepatitis C. Sexually transmitted infections (STIs)  You should be screened for sexually transmitted infections (STIs) including gonorrhea and chlamydia if:  You are sexually active and are younger than 74 years of age.  You are older than 74 years of age and your health care provider tells you that you are at risk for this type of infection.  Your sexual activity has changed since you were last screened and you are at an increased risk for chlamydia or gonorrhea. Ask your health care provider if you are at risk.  If you do not have HIV, but are at risk, it may be recommended that you take a prescription medicine daily to prevent HIV infection. This is called pre-exposure prophylaxis (PrEP). You are considered at risk if:  You are sexually active and do not regularly use condoms or know the HIV status of your partner(s).  You take drugs by injection.  You are  sexually active with a partner who has HIV. Talk with your health care provider about whether you are at high risk of being infected with HIV. If you choose to begin PrEP, you should first be tested for HIV. You should then be tested every 3 months for as long as you are taking PrEP.  PREGNANCY   If you are premenopausal and you may become pregnant, ask your health care provider about preconception counseling.  If you may become pregnant, take 400 to 800 micrograms (mcg) of folic acid every day.  If you want to prevent pregnancy, talk to your health care provider about birth control (contraception). OSTEOPOROSIS AND MENOPAUSE   Osteoporosis is a disease in which the bones lose minerals and strength with aging. This can result in serious bone fractures. Your risk for osteoporosis can be identified using a bone density scan.  If you are 62 years of age or older, or if you are at risk for osteoporosis and fractures, ask your health care provider if you should be screened.  Ask your health care provider whether you should take a calcium or vitamin D supplement to lower your risk for osteoporosis.  Menopause may have certain physical symptoms and risks.  Hormone replacement therapy may reduce some of these symptoms and risks. Talk to your health care provider about whether hormone replacement therapy is right for you.  HOME CARE INSTRUCTIONS   Schedule regular health, dental, and eye exams.  Stay current with your immunizations.   Do not use any tobacco products including cigarettes, chewing tobacco, or electronic cigarettes.  If you are pregnant, do not drink alcohol.  If you are breastfeeding, limit how much and how often you drink alcohol.  Limit alcohol intake to no more than 1 drink per day for nonpregnant women. One drink equals 12 ounces of beer, 5 ounces of wine, or 1 ounces of hard liquor.  Do not use street drugs.  Do not share needles.  Ask your health care provider for  help if you need support or information about quitting drugs.  Tell your health care provider if you often feel depressed.  Tell your health care provider if you have ever been abused or do not feel safe at home.   This information is not intended to replace advice given to you by your health care provider. Make sure you discuss any  questions you have with your health care provider.   Document Released: 03/04/2011 Document Revised: 09/09/2014 Document Reviewed: 07/21/2013 Elsevier Interactive Patient Education 2016 Arbela K. Panosh M.D.

## 2015-11-20 ENCOUNTER — Encounter: Payer: Self-pay | Admitting: Internal Medicine

## 2015-11-20 ENCOUNTER — Ambulatory Visit (INDEPENDENT_AMBULATORY_CARE_PROVIDER_SITE_OTHER): Payer: Medicare Other | Admitting: Internal Medicine

## 2015-11-20 VITALS — BP 126/72 | Temp 97.7°F | Ht 60.0 in | Wt 99.6 lb

## 2015-11-20 DIAGNOSIS — Z0000 Encounter for general adult medical examination without abnormal findings: Secondary | ICD-10-CM | POA: Diagnosis not present

## 2015-11-20 DIAGNOSIS — M4727 Other spondylosis with radiculopathy, lumbosacral region: Secondary | ICD-10-CM

## 2015-11-20 DIAGNOSIS — Z87440 Personal history of urinary (tract) infections: Secondary | ICD-10-CM | POA: Diagnosis not present

## 2015-11-20 DIAGNOSIS — R52 Pain, unspecified: Secondary | ICD-10-CM

## 2015-11-20 DIAGNOSIS — M810 Age-related osteoporosis without current pathological fracture: Secondary | ICD-10-CM | POA: Diagnosis not present

## 2015-11-20 DIAGNOSIS — R399 Unspecified symptoms and signs involving the genitourinary system: Secondary | ICD-10-CM

## 2015-11-20 NOTE — Patient Instructions (Addendum)
Plan fu depending  On urologic evaluation and explanation of the lower abd pain and  LUTS.   Would advise repeat . Cbc  Blood count .      In 3 months . Can readdress  Osteoporosis  dexa scan pre visit   May June time     Health Maintenance, Female Adopting a healthy lifestyle and getting preventive care can go a long way to promote health and wellness. Talk with your health care provider about what schedule of regular examinations is right for you. This is a good chance for you to check in with your provider about disease prevention and staying healthy. In between checkups, there are plenty of things you can do on your own. Experts have done a lot of research about which lifestyle changes and preventive measures are most likely to keep you healthy. Ask your health care provider for more information. WEIGHT AND DIET  Eat a healthy diet  Be sure to include plenty of vegetables, fruits, low-fat dairy products, and lean protein.  Do not eat a lot of foods high in solid fats, added sugars, or salt.  Get regular exercise. This is one of the most important things you can do for your health.  Most adults should exercise for at least 150 minutes each week. The exercise should increase your heart rate and make you sweat (moderate-intensity exercise).  Most adults should also do strengthening exercises at least twice a week. This is in addition to the moderate-intensity exercise.  Maintain a healthy weight  Body mass index (BMI) is a measurement that can be used to identify possible weight problems. It estimates body fat based on height and weight. Your health care provider can help determine your BMI and help you achieve or maintain a healthy weight.  For females 19 years of age and older:   A BMI below 18.5 is considered underweight.  A BMI of 18.5 to 24.9 is normal.  A BMI of 25 to 29.9 is considered overweight.  A BMI of 30 and above is considered obese.  Watch levels of cholesterol  and blood lipids  You should start having your blood tested for lipids and cholesterol at 74 years of age, then have this test every 5 years.  You may need to have your cholesterol levels checked more often if:  Your lipid or cholesterol levels are high.  You are older than 74 years of age.  You are at high risk for heart disease.  CANCER SCREENING   Lung Cancer  Lung cancer screening is recommended for adults 72-33 years old who are at high risk for lung cancer because of a history of smoking.  A yearly low-dose CT scan of the lungs is recommended for people who:  Currently smoke.  Have quit within the past 15 years.  Have at least a 30-pack-year history of smoking. A pack year is smoking an average of one pack of cigarettes a day for 1 year.  Yearly screening should continue until it has been 15 years since you quit.  Yearly screening should stop if you develop a health problem that would prevent you from having lung cancer treatment.  Breast Cancer  Practice breast self-awareness. This means understanding how your breasts normally appear and feel.  It also means doing regular breast self-exams. Let your health care provider know about any changes, no matter how small.  If you are in your 20s or 30s, you should have a clinical breast exam (CBE) by a health  care provider every 1-3 years as part of a regular health exam.  If you are 30 or older, have a CBE every year. Also consider having a breast X-ray (mammogram) every year.  If you have a family history of breast cancer, talk to your health care provider about genetic screening.  If you are at high risk for breast cancer, talk to your health care provider about having an MRI and a mammogram every year.  Breast cancer gene (BRCA) assessment is recommended for women who have family members with BRCA-related cancers. BRCA-related cancers include:  Breast.  Ovarian.  Tubal.  Peritoneal cancers.  Results of the  assessment will determine the need for genetic counseling and BRCA1 and BRCA2 testing. Cervical Cancer Your health care provider may recommend that you be screened regularly for cancer of the pelvic organs (ovaries, uterus, and vagina). This screening involves a pelvic examination, including checking for microscopic changes to the surface of your cervix (Pap test). You may be encouraged to have this screening done every 3 years, beginning at age 59.  For women ages 51-65, health care providers may recommend pelvic exams and Pap testing every 3 years, or they may recommend the Pap and pelvic exam, combined with testing for human papilloma virus (HPV), every 5 years. Some types of HPV increase your risk of cervical cancer. Testing for HPV may also be done on women of any age with unclear Pap test results.  Other health care providers may not recommend any screening for nonpregnant women who are considered low risk for pelvic cancer and who do not have symptoms. Ask your health care provider if a screening pelvic exam is right for you.  If you have had past treatment for cervical cancer or a condition that could lead to cancer, you need Pap tests and screening for cancer for at least 20 years after your treatment. If Pap tests have been discontinued, your risk factors (such as having a new sexual partner) need to be reassessed to determine if screening should resume. Some women have medical problems that increase the chance of getting cervical cancer. In these cases, your health care provider may recommend more frequent screening and Pap tests. Colorectal Cancer  This type of cancer can be detected and often prevented.  Routine colorectal cancer screening usually begins at 74 years of age and continues through 74 years of age.  Your health care provider may recommend screening at an earlier age if you have risk factors for colon cancer.  Your health care provider may also recommend using home test kits  to check for hidden blood in the stool.  A small camera at the end of a tube can be used to examine your colon directly (sigmoidoscopy or colonoscopy). This is done to check for the earliest forms of colorectal cancer.  Routine screening usually begins at age 38.  Direct examination of the colon should be repeated every 5-10 years through 74 years of age. However, you may need to be screened more often if early forms of precancerous polyps or small growths are found. Skin Cancer  Check your skin from head to toe regularly.  Tell your health care provider about any new moles or changes in moles, especially if there is a change in a mole's shape or color.  Also tell your health care provider if you have a mole that is larger than the size of a pencil eraser.  Always use sunscreen. Apply sunscreen liberally and repeatedly throughout the day.  Protect  yourself by wearing long sleeves, pants, a wide-brimmed hat, and sunglasses whenever you are outside. HEART DISEASE, DIABETES, AND HIGH BLOOD PRESSURE   High blood pressure causes heart disease and increases the risk of stroke. High blood pressure is more likely to develop in:  People who have blood pressure in the high end of the normal range (130-139/85-89 mm Hg).  People who are overweight or obese.  People who are African American.  If you are 57-7 years of age, have your blood pressure checked every 3-5 years. If you are 27 years of age or older, have your blood pressure checked every year. You should have your blood pressure measured twice--once when you are at a hospital or clinic, and once when you are not at a hospital or clinic. Record the average of the two measurements. To check your blood pressure when you are not at a hospital or clinic, you can use:  An automated blood pressure machine at a pharmacy.  A home blood pressure monitor.  If you are between 57 years and 59 years old, ask your health care provider if you should  take aspirin to prevent strokes.  Have regular diabetes screenings. This involves taking a blood sample to check your fasting blood sugar level.  If you are at a normal weight and have a low risk for diabetes, have this test once every three years after 74 years of age.  If you are overweight and have a high risk for diabetes, consider being tested at a younger age or more often. PREVENTING INFECTION  Hepatitis B  If you have a higher risk for hepatitis B, you should be screened for this virus. You are considered at high risk for hepatitis B if:  You were born in a country where hepatitis B is common. Ask your health care provider which countries are considered high risk.  Your parents were born in a high-risk country, and you have not been immunized against hepatitis B (hepatitis B vaccine).  You have HIV or AIDS.  You use needles to inject street drugs.  You live with someone who has hepatitis B.  You have had sex with someone who has hepatitis B.  You get hemodialysis treatment.  You take certain medicines for conditions, including cancer, organ transplantation, and autoimmune conditions. Hepatitis C  Blood testing is recommended for:  Everyone born from [birth year] through 1964.  Anyone with known risk factors for hepatitis C. Sexually transmitted infections (STIs)  You should be screened for sexually transmitted infections (STIs) including gonorrhea and chlamydia if:  You are sexually active and are younger than 74 years of age.  You are older than 74 years of age and your health care provider tells you that you are at risk for this type of infection.  Your sexual activity has changed since you were last screened and you are at an increased risk for chlamydia or gonorrhea. Ask your health care provider if you are at risk.  If you do not have HIV, but are at risk, it may be recommended that you take a prescription medicine daily to prevent HIV infection. This is called  pre-exposure prophylaxis (PrEP). You are considered at risk if:  You are sexually active and do not regularly use condoms or know the HIV status of your partner(s).  You take drugs by injection.  You are sexually active with a partner who has HIV. Talk with your health care provider about whether you are at high risk of being infected with  HIV. If you choose to begin PrEP, you should first be tested for HIV. You should then be tested every 3 months for as long as you are taking PrEP.  PREGNANCY   If you are premenopausal and you may become pregnant, ask your health care provider about preconception counseling.  If you may become pregnant, take 400 to 800 micrograms (mcg) of folic acid every day.  If you want to prevent pregnancy, talk to your health care provider about birth control (contraception). OSTEOPOROSIS AND MENOPAUSE   Osteoporosis is a disease in which the bones lose minerals and strength with aging. This can result in serious bone fractures. Your risk for osteoporosis can be identified using a bone density scan.  If you are 2 years of age or older, or if you are at risk for osteoporosis and fractures, ask your health care provider if you should be screened.  Ask your health care provider whether you should take a calcium or vitamin D supplement to lower your risk for osteoporosis.  Menopause may have certain physical symptoms and risks.  Hormone replacement therapy may reduce some of these symptoms and risks. Talk to your health care provider about whether hormone replacement therapy is right for you.  HOME CARE INSTRUCTIONS   Schedule regular health, dental, and eye exams.  Stay current with your immunizations.   Do not use any tobacco products including cigarettes, chewing tobacco, or electronic cigarettes.  If you are pregnant, do not drink alcohol.  If you are breastfeeding, limit how much and how often you drink alcohol.  Limit alcohol intake to no more than 1  drink per day for nonpregnant women. One drink equals 12 ounces of beer, 5 ounces of wine, or 1 ounces of hard liquor.  Do not use street drugs.  Do not share needles.  Ask your health care provider for help if you need support or information about quitting drugs.  Tell your health care provider if you often feel depressed.  Tell your health care provider if you have ever been abused or do not feel safe at home.   This information is not intended to replace advice given to you by your health care provider. Make sure you discuss any questions you have with your health care provider.   Document Released: 03/04/2011 Document Revised: 09/09/2014 Document Reviewed: 07/21/2013 Elsevier Interactive Patient Education [employer].

## 2015-12-06 NOTE — Patient Instructions (Addendum)
Ok to take magnesium  But dec cose if getting diarrhea  saty hydrated and  Stretch before bedtime.   Keep appt in June   We will add labs to that blood draw to be done including  Magnesium and  Thyroid tests  Etc.  Get ua and culutre today and can forward this to dr Karsten Ro for further  advice .    Leg Cramps Leg cramps occur when a muscle or muscles tighten and you have no control over this tightening (involuntary muscle contraction). Muscle cramps can develop in any muscle, but the most common place is in the calf muscles of the leg. Those cramps can occur during exercise or when you are at rest. Leg cramps are painful, and they may last for a few seconds to a few minutes. Cramps may return several times before they finally stop. Usually, leg cramps are not caused by a serious medical problem. In many cases, the cause is not known. Some common causes include:  Overexertion.  Overuse from repetitive motions, or doing the same thing over and over.  Remaining in a certain position for a long period of time.  Improper preparation, form, or technique while performing a sport or an activity.  Dehydration.  Injury.  Side effects of some medicines.  Abnormally low levels of the salts and ions in your blood (electrolytes), especially potassium and calcium. These levels could be low if you are taking water pills (diuretics) or if you are pregnant. HOME CARE INSTRUCTIONS Watch your condition for any changes. Taking the following actions may help to lessen any discomfort that you are feeling:  Stay well-hydrated. Drink enough fluid to keep your urine clear or pale yellow.  Try massaging, stretching, and relaxing the affected muscle. Do this for several minutes at a time.  For tight or tense muscles, use a warm towel, heating pad, or hot shower water directed to the affected area.  If you are sore or have pain after a cramp, applying ice to the affected area may relieve discomfort.  Put  ice in a plastic bag.  Place a towel between your skin and the bag.  Leave the ice on for 20 minutes, 2-3 times per day.  Avoid strenuous exercise for several days if you have been having frequent leg cramps.  Make sure that your diet includes the essential minerals for your muscles to work normally.  Take medicines only as directed by your health care provider. SEEK MEDICAL CARE IF:  Your leg cramps get more severe or more frequent, or they do not improve over time.  Your foot becomes cold, numb, or blue.   This information is not intended to replace advice given to you by your health care provider. Make sure you discuss any questions you have with your health care provider.   Document Released: 09/26/2004 Document Revised: 01/03/2015 Document Reviewed: 07/27/2014 Elsevier Interactive Patient Education [employer].

## 2015-12-06 NOTE — Progress Notes (Signed)
Chief Complaint  Patient presents with  . Urinary Tract Infection  . leg crapms    HPI: Kaitlyn Flowers 75 y.o.   Patient Kaitlyn Flowers  comes in today for SDA for   problem evaluation. Leg cramps continue    ocass  Eat mustard   wor x 1 and then more sever and more frequent  And all night . Baclofen caused sdation.  2 magnesium  At night to try vinegar.  Slept very well last night uncertain if that helped   Advised to see pcp before continuing.  Urology  eval  And was ok  And then no obvious problem  But having  Sx.   Suggested   For   Pyridium   For a while until feels better .  Helps.  Cant give ua today [name] is uro.  Back no progression tries to stay active  No fever new sx  ROS: See pertinent positives and negatives per HPI.  Past Medical History  Diagnosis Date  . Osteopenia   . 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  . Wears hearing aid   . PONV (postoperative nausea and vomiting)   . Anemia     Family History  Problem Relation Age of Onset  . Stroke Mother     2010  . Heart attack Father   . Anesthesia problems Neg Hx     Social History   Social History  . Marital Status: Legally Separated    Spouse Name: N/A  . Number of Children: N/A  . Years of Education: N/A   Social History Main Topics  . Smoking status: Former Smoker -- 0.25 packs/day for 2 years    Types: Cigarettes    Quit date: 09/03/1967  . Smokeless tobacco: None  . Alcohol Use: 2.4 oz/week    4 Glasses of wine per week  . Drug Use: No  . Sexual Activity: Not Asked   Other Topics Concern  . 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 b complex vitamins capsule Take 1 capsule by mouth daily.    Carin Hock Iron (PERFECT IRON) 25 MG TABS Take 25 mg by mouth  daily.    . Cholecalciferol (VITAMIN D3) 5000 UNITS CAPS Take 1 capsule by mouth daily.    Marland Kitchen NALTREXONE HCL PO Take 4.5 mg by mouth 1 day or 1 dose.    . pregabalin (LYRICA) 25 MG capsule Take 1 capsule (25 mg total) by mouth 2 (two) times daily. 180 capsule 0   No facility-administered medications prior to visit.     EXAM:  BP 110/60 mmHg  Pulse 106  Temp(Src) 98 F (36.7 C) (Oral)  Wt 102 lb (46.267 kg)  Body mass index is 19.92 kg/(m^2).  GENERAL: vitals reviewed and listed above, alert, oriented, appears well hydrated and in no acute distress HEENT: atraumatic, conjunctiva  clear, no obvious abnormalities on inspection of external nose and ears NECK: no obvious masses on inspection palpation  CV: HRRR, no clubbing cyanosis or  peripheral edema nl cap refill  MS: moves all extremities without noticeable focal  Abnormality nl gaitn nl pulses in feet  And no focal findings  PSYCH: pleasant and cooperative, no obvious depression or anxiety  Lab Results  Component Value Date   WBC 7.6 11/02/2015   HGB 11.8* 11/02/2015   HCT 35.4* 11/02/2015   PLT 275 11/02/2015   GLUCOSE 89 11/02/2015   CHOL 231* 11/27/2010   TRIG 41.0 11/27/2010   HDL 101.80 11/27/2010   LDLDIRECT 110.1 11/27/2010   LDLCALC 110* 04/09/2007   ALT 19 07/11/2014   AST 21 07/11/2014   NA 135 11/02/2015   K 4.9 11/02/2015   CL 99 11/02/2015   CREATININE 0.67 11/02/2015   BUN 14 11/02/2015   CO2 28 11/02/2015   TSH 1.21 07/11/2014   INR 0.92 08/22/2012    ASSESSMENT AND PLAN:  Discussed the following assessment and plan:  Nocturnal leg cramps - no ob cause except back issue ok to use mag for now and fu labs when due to add on mag   pth and thyroid  - Plan: Basic metabolic panel, PTH, intact and calcium, TSH, T4, free, Magnesium, IBC panel, Ferritin  Dysuria - left over from sig uti  uinder uro care  couldnt give spec today . will fu with uro as needed  - Plan: Basic metabolic panel, PTH, intact and  calcium, TSH, T4, free, Magnesium, IBC panel, Ferritin, CANCELED: POC Urinalysis Dipstick  Anemia, unspecified anemia type - Plan: Basic metabolic panel, PTH, intact and calcium, TSH, T4, free, Magnesium, IBC panel, Ferritin  -Patient advised to return or notify health care team  if symptoms worsen ,persist or new concerns arise.  Patient Instructions  Ok to take magnesium  But dec cose if getting diarrhea  saty hydrated and  Stretch before bedtime.   Keep appt in June   We will add labs to that blood draw to be done including  Magnesium and  Thyroid tests  Etc.  Get ua and culutre today and can forward this to dr Karsten Ro for further  advice .    Leg Cramps Leg cramps occur when a muscle or muscles tighten and you have no control over this tightening (involuntary muscle contraction). Muscle cramps can develop in any muscle, but the most common place is in the calf muscles of the leg. Those cramps can occur during exercise or when you are at rest. Leg cramps are painful, and they may last for a few seconds to a few minutes. Cramps may return several times before they finally stop. Usually, leg cramps are not caused by a serious medical problem. In many cases, the cause is not known. Some common causes include:  Overexertion.  Overuse from repetitive motions, or doing the same thing over and over.  Remaining in a certain position for a long period of time.  Improper preparation, form, or technique while performing a sport or an activity.  Dehydration.  Injury.  Side effects of some medicines.  Abnormally low levels of the salts and ions in your blood (electrolytes), especially potassium and calcium. These levels could be low if you are taking water pills (diuretics) or if you are pregnant. HOME CARE INSTRUCTIONS Watch your condition for any changes. Taking the following actions may help to lessen any discomfort that you are feeling:  Stay well-hydrated. Drink enough fluid to keep  your urine clear or pale yellow.  Try massaging, stretching, and relaxing the affected muscle. Do this for several minutes at a time.  For tight or tense muscles, use a warm towel, heating pad, or hot shower water directed to the affected area.  If you are sore or have pain after a cramp, applying ice  to the affected area may relieve discomfort.  Put ice in a plastic bag.  Place a towel between your skin and the bag.  Leave the ice on for 20 minutes, 2-3 times per day.  Avoid strenuous exercise for several days if you have been having frequent leg cramps.  Make sure that your diet includes the essential minerals for your muscles to work normally.  Take medicines only as directed by your health care provider. SEEK MEDICAL CARE IF:  Your leg cramps get more severe or more frequent, or they do not improve over time.  Your foot becomes cold, numb, or blue.   This information is not intended to replace advice given to you by your health care provider. Make sure you discuss any questions you have with your health care provider.   Document Released: 09/26/2004 Document Revised: 01/03/2015 Document Reviewed: 07/27/2014 Elsevier Interactive Patient Education 2016 Throckmorton K. Panosh M.D.

## 2015-12-07 ENCOUNTER — Ambulatory Visit (INDEPENDENT_AMBULATORY_CARE_PROVIDER_SITE_OTHER): Payer: Medicare Other | Admitting: Internal Medicine

## 2015-12-07 ENCOUNTER — Encounter: Payer: Self-pay | Admitting: Internal Medicine

## 2015-12-07 VITALS — BP 110/60 | HR 106 | Temp 98.0°F | Wt 102.0 lb

## 2015-12-07 DIAGNOSIS — G4762 Sleep related leg cramps: Secondary | ICD-10-CM | POA: Insufficient documentation

## 2015-12-07 DIAGNOSIS — R300 Dysuria: Secondary | ICD-10-CM | POA: Diagnosis not present

## 2015-12-07 DIAGNOSIS — D649 Anemia, unspecified: Secondary | ICD-10-CM | POA: Diagnosis not present

## 2015-12-07 NOTE — Progress Notes (Signed)
Pre visit review using our clinic review tool, if applicable. No additional management support is needed unless otherwise documented below in the visit note. 

## 2016-01-08 ENCOUNTER — Ambulatory Visit (INDEPENDENT_AMBULATORY_CARE_PROVIDER_SITE_OTHER)
Admission: RE | Admit: 2016-01-08 | Discharge: 2016-01-08 | Disposition: A | Discharge status: Home / Self Care | Payer: Medicare Other | Source: Ambulatory Visit | Attending: Internal Medicine | Admitting: Internal Medicine

## 2016-01-08 DIAGNOSIS — M810 Age-related osteoporosis without current pathological fracture: Secondary | ICD-10-CM | POA: Diagnosis not present

## 2016-01-19 NOTE — Progress Notes (Signed)
Chief Complaint  Patient presents with  . Abdominal Pain    HPI: Kaitlyn Flowers 75 y.o. Patient Kaitlyn Flowers  comes in today for SDA for  new problem evaluation.  Was at beach last week and then noted abd cramps for hours and then have bm but stil had cramps .   Then had diarrhea for 2-3 days  But stools tunred green color   And then lateral pains abd and now  Better but some cramps on right.  Today better .  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  No fever chills and vomiting.  Urinary  : followed urology .   cathing   self tid   And no problems . Had a new trial of manuca honey .  From [place].    Normal consitency  abouyt ths same  ROS: See pertinent positives and negatives per HPI.  Past Medical History  Diagnosis Date  . Osteopenia   . 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  . Wears hearing aid   . PONV (postoperative nausea and vomiting)   . Anemia     Family History  Problem Relation Age of Onset  . Stroke Mother     2010  . Heart attack Father   . Anesthesia problems Neg Hx     Social History   Social History  . Marital Status: Legally Separated    Spouse Name: N/A  . Number of Children: N/A  . Years of Education: N/A   Social History Main Topics  . Smoking status: Former Smoker -- 0.25 packs/day for 2 years    Types: Cigarettes    Quit date: 09/03/1967  . Smokeless tobacco: None  . Alcohol Use: 2.4 oz/week    4 Glasses of wine per week  . Drug Use: No  . Sexual Activity: Not Asked   Other Topics Concern  . 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 b complex vitamins capsule Take 1 capsule by mouth daily.    Carin Hock Iron (PERFECT IRON) 25 MG TABS Take 25 mg by mouth daily.    .  Cholecalciferol (VITAMIN D3) 5000 UNITS CAPS Take 1 capsule by mouth daily.    . magnesium 30 MG tablet Take 30 mg by mouth 2 (two) times daily.    . pregabalin (LYRICA) 25 MG capsule Take 1 capsule (25 mg total) by mouth 2 (two) times daily. 180 capsule 0  . NALTREXONE HCL PO Take 4.5 mg by mouth 1 day or 1 dose.    . phenazopyridine (PYRIDIUM) 200 MG tablet Take 200 mg by mouth 3 (three) times daily.     No facility-administered medications prior to visit.     EXAM:  BP 110/70 mmHg  Pulse 78  Temp(Src) 97.4 F (36.3 C) (Oral)  Wt 99 lb 6.4 oz (45.088 kg)  Body mass index is 19.41 kg/(m^2).  GENERAL: vitals reviewed and listed above, alert, oriented, appears well hydrated and in no acute distress HEENT: atraumatic, conjunctiva  clear, no obvious abnormalities on inspection of external nose and ears non icteric   NECK: no obvious masses on inspection palpation  Abdomen:  Sof,t normal  bowel sounds without hepatosplenomegaly, no guarding rebound or masses no CVA tenderness bs nl points to right mid as area of where she gets twinging   Mid line abd scar   CV: HRRR, no clubbing cyanosis or  peripheral edema nl cap refill  MS: moves all extremities without noticeable focal  abnormality PSYCH: pleasant and cooperative, no obvious depression or anxiety Lab Results  Component Value Date   WBC 7.6 11/02/2015   HGB 11.8* 11/02/2015   HCT 35.4* 11/02/2015   PLT 275 11/02/2015   GLUCOSE 89 11/02/2015   CHOL 231* 11/27/2010   TRIG 41.0 11/27/2010   HDL 101.80 11/27/2010   LDLDIRECT 110.1 11/27/2010   LDLCALC 110* 04/09/2007   ALT 19 07/11/2014   AST 21 07/11/2014   NA 135 11/02/2015   K 4.9 11/02/2015   CL 99 11/02/2015   CREATININE 0.67 11/02/2015   BUN 14 11/02/2015   CO2 28 11/02/2015   TSH 1.21 07/11/2014   INR 0.92 08/22/2012    ASSESSMENT AND PLAN:  Discussed the following assessment and plan:  Abdominal cramping  Change in stool Sounds like rapid  transit and is  jsut about better .   Expectant management. If    persistent or progressive plan stool tests kub etc   utd on colon 2012   On a prn basis eagle  -Patient advised to return or notify health care team  if symptoms worsen ,persist or new concerns arise.  Patient Instructions  Poss  Stomach bug that should get better   If recurrent   Or increasing sx again  Then weill do more evaluation  No evidence of bowel obstruction today .  Hervey Ard tools can be a sign of rapid transit  May back off on magnesium if having diarrhea.        Standley Brooking. Kaven Cumbie M.D.  copyh of bone density  early osteroporosis   Seeing urology  Self caths  High residual and has fu .

## 2016-01-22 ENCOUNTER — Ambulatory Visit (INDEPENDENT_AMBULATORY_CARE_PROVIDER_SITE_OTHER): Payer: Medicare Other | Admitting: Internal Medicine

## 2016-01-22 ENCOUNTER — Encounter: Payer: Self-pay | Admitting: Internal Medicine

## 2016-01-22 VITALS — BP 110/70 | HR 78 | Temp 97.4°F | Wt 99.4 lb

## 2016-01-22 DIAGNOSIS — R109 Unspecified abdominal pain: Secondary | ICD-10-CM | POA: Diagnosis not present

## 2016-01-22 DIAGNOSIS — R195 Other fecal abnormalities: Secondary | ICD-10-CM

## 2016-01-22 NOTE — Progress Notes (Signed)
Pre visit review using our clinic review tool, if applicable. No additional management support is needed unless otherwise documented below in the visit note. 

## 2016-01-22 NOTE — Patient Instructions (Signed)
Poss  Stomach bug that should get better   If recurrent   Or increasing sx again  Then weill do more evaluation  No evidence of bowel obstruction today .  Hervey Ard tools can be a sign of rapid transit  May back off on magnesium if having diarrhea.

## 2016-02-13 ENCOUNTER — Other Ambulatory Visit (INDEPENDENT_AMBULATORY_CARE_PROVIDER_SITE_OTHER): Payer: Medicare Other

## 2016-02-13 DIAGNOSIS — D649 Anemia, unspecified: Secondary | ICD-10-CM

## 2016-02-13 DIAGNOSIS — R300 Dysuria: Secondary | ICD-10-CM | POA: Diagnosis not present

## 2016-02-13 DIAGNOSIS — G4762 Sleep related leg cramps: Secondary | ICD-10-CM

## 2016-02-13 LAB — CBC WITH DIFFERENTIAL/PLATELET
Basophils Absolute: 0.1 10*3/uL (ref 0.0–0.1)
Basophils Relative: 1 % (ref 0.0–3.0)
Eosinophils Absolute: 0.1 10*3/uL (ref 0.0–0.7)
Eosinophils Relative: 1.2 % (ref 0.0–5.0)
HCT: 34 % — ABNORMAL LOW (ref 36.0–46.0)
Hemoglobin: 11.6 g/dL — ABNORMAL LOW (ref 12.0–15.0)
Lymphocytes Relative: 33.4 % (ref 12.0–46.0)
Lymphs Abs: 2.1 10*3/uL (ref 0.7–4.0)
MCHC: 34 g/dL (ref 30.0–36.0)
MCV: 98.7 fl (ref 78.0–100.0)
Monocytes Absolute: 0.5 10*3/uL (ref 0.1–1.0)
Monocytes Relative: 8.3 % (ref 3.0–12.0)
Neutro Abs: 3.5 10*3/uL (ref 1.4–7.7)
Neutrophils Relative %: 56.1 % (ref 43.0–77.0)
Platelets: 246 10*3/uL (ref 150.0–400.0)
RBC: 3.45 Mil/uL — ABNORMAL LOW (ref 3.87–5.11)
RDW: 13.6 % (ref 11.5–15.5)
WBC: 6.2 10*3/uL (ref 4.0–10.5)

## 2016-02-13 LAB — BASIC METABOLIC PANEL
BUN: 12 mg/dL (ref 6–23)
CO2: 30 mEq/L (ref 19–32)
Calcium: 9.8 mg/dL (ref 8.4–10.5)
Chloride: 98 mEq/L (ref 96–112)
Creatinine, Ser: 0.67 mg/dL (ref 0.40–1.20)
GFR: 91.49 mL/min (ref >60.00)
Glucose, Bld: 79 mg/dL (ref 70–99)
Potassium: 4.2 mEq/L (ref 3.5–5.1)
Sodium: 135 mEq/L (ref 135–145)

## 2016-02-13 LAB — IBC PANEL
Iron: 96 ug/dL (ref 42–145)
Saturation Ratios: 24.1 % (ref 20.0–50.0)
Transferrin: 285 mg/dL (ref 212.0–360.0)

## 2016-02-13 LAB — TSH: TSH: 1.28 u[IU]/mL (ref 0.35–4.50)

## 2016-02-13 LAB — MAGNESIUM: Magnesium: 2.1 mg/dL (ref 1.5–2.5)

## 2016-02-13 LAB — T4, FREE: Free T4: 0.97 ng/dL (ref 0.60–1.60)

## 2016-02-13 LAB — FERRITIN: Ferritin: 44.7 ng/mL (ref 10.0–291.0)

## 2016-02-14 LAB — PTH, INTACT AND CALCIUM
Calcium: 9.3 mg/dL (ref 8.4–10.5)
PTH: 59 pg/mL (ref 14–64)

## 2016-02-20 ENCOUNTER — Encounter: Payer: Self-pay | Admitting: Internal Medicine

## 2016-02-20 ENCOUNTER — Ambulatory Visit (INDEPENDENT_AMBULATORY_CARE_PROVIDER_SITE_OTHER): Payer: Medicare Other | Admitting: Internal Medicine

## 2016-02-20 VITALS — BP 120/70 | Temp 97.9°F | Ht 60.0 in | Wt 100.5 lb

## 2016-02-20 DIAGNOSIS — D649 Anemia, unspecified: Secondary | ICD-10-CM | POA: Diagnosis not present

## 2016-02-20 DIAGNOSIS — Z8719 Personal history of other diseases of the digestive system: Secondary | ICD-10-CM

## 2016-02-20 DIAGNOSIS — G4762 Sleep related leg cramps: Secondary | ICD-10-CM | POA: Diagnosis not present

## 2016-02-20 DIAGNOSIS — M810 Age-related osteoporosis without current pathological fracture: Secondary | ICD-10-CM

## 2016-02-20 NOTE — Progress Notes (Signed)
Pre visit review using our clinic review tool, if applicable. No additional management support is needed unless otherwise documented below in the visit note.  Chief Complaint  Patient presents with  . Follow-up    HPI: Kaitlyn Flowers 75 y.o.    Fu leg cramps and labs     Cramps are dramatically better on lyrica an although causes drowsiness is helpful...  Uncertain how to go on the osteoporosis concerned about se  Atypical fx etc with bishphosphonates  GU  No longer has to cath self   PVD is down to do this  ocass to ensure ok   Need name of derm  Office again   ROS: See pertinent positives and negatives per HPI. ocass abd pain like when she had SBO but goes away  No vomiting weight loss.   Past Medical History  Diagnosis Date  . Osteopenia   . 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  . Wears hearing aid   . PONV (postoperative nausea and vomiting)   . Anemia     Family History  Problem Relation Age of Onset  . Stroke Mother     2010  . Heart attack Father   . Anesthesia problems Neg Hx     Social History   Social History  . Marital Status: Legally Separated    Spouse Name: N/A  . Number of Children: N/A  . Years of Education: N/A   Social History Main Topics  . Smoking status: Former Smoker -- 0.25 packs/day for 2 years    Types: Cigarettes    Quit date: 09/03/1967  . Smokeless tobacco: None  . Alcohol Use: 2.4 oz/week    4 Glasses of wine per week  . Drug Use: No  . Sexual Activity: Not Asked   Other Topics Concern  . 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 b complex vitamins capsule Take 1 capsule by mouth daily.    Carin Hock Iron (PERFECT IRON) 25 MG TABS Take 25 mg by mouth daily.    . Cholecalciferol (VITAMIN D3) 5000  UNITS CAPS Take 1 capsule by mouth daily.    . magnesium 30 MG tablet Take 30 mg by mouth 2 (two) times daily.    . Meth-Hyo-M Bl-Na Phos-Ph Sal (URO-MP) 118 MG CAPS Take 118 mg by mouth 2 (two) times daily.  3  . pregabalin (LYRICA) 25 MG capsule Take 1 capsule (25 mg total) by mouth 2 (two) times daily. 180 capsule 0   No facility-administered medications prior to visit.     EXAM:  BP 120/70 mmHg  Temp(Src) 97.9 F (36.6 C) (Oral)  Ht 5' (1.524 m)  Wt 100 lb 8 oz (45.587 kg)  BMI 19.63 kg/m2  Body mass index is 19.63 kg/(m^2).  GENERAL: vitals reviewed and listed above, alert, oriented, appears well hydrated and in no acute distress HEENT: atraumatic, conjunctiva  clear, no obvious abnormalities on inspection of external nose and ears MS: moves all extremities without noticeable focal  abnormality PSYCH: pleasant and cooperative, no obvious depression or anxiety Lab Results  Component Value Date   WBC 6.2 02/13/2016   HGB 11.6* 02/13/2016   HCT 34.0* 02/13/2016   PLT 246.0 02/13/2016   GLUCOSE  79 02/13/2016   CHOL 231* 11/27/2010   TRIG 41.0 11/27/2010   HDL 101.80 11/27/2010   LDLDIRECT 110.1 11/27/2010   LDLCALC 110* 04/09/2007   ALT 19 07/11/2014   AST 21 07/11/2014   NA 135 02/13/2016   K 4.2 02/13/2016   CL 98 02/13/2016   CREATININE 0.67 02/13/2016   BUN 12 02/13/2016   CO2 30 02/13/2016   TSH 1.28 02/13/2016   INR 0.92 08/22/2012   pth mag ibc ferritin all normal   ASSESSMENT AND PLAN:  Discussed the following assessment and plan:  Nocturnal leg cramps - much better  on lyrica continue risk benefot  Osteoporosis - disc that she should readdresss risk benefit    refer back to dr Darnell Level about  options  risk of atypical fx very low compared to reg fx - Plan: Ambulatory referral to Endocrinology, CBC with Differential/Platelet, Vitamin B12, Folate, Immunofixation electrophoresis, Celiac panel 10  Anemia, unspecified anemia type - uncertain cause  fu  repeat b12  folate  ife  - Plan: CBC with Differential/Platelet, Vitamin B12, Folate, Immunofixation electrophoresis, Celiac panel 10  Hx of small bowel obstruction ife  b12 folate none in system  ife  Does do dairy but vegetarian otherwise some fish  Note in remote past of some anemia  Under different circumstance  -Patient advised to return or notify health care team  if symptoms worsen ,persist or new concerns arise. Total visit 52mins > 50% spent counseling and coordinating care as indicated in above note and in instructions to patient .     Patient Instructions  Let us know IU of vit d  Last check was good.  About 18 months ago  considier  Prolia   As opposed to  Fosamax type medications .   Will arrange   Referral to dr Cruzita Lederer   About risk benefit of medication intervention.   Borderline anemia   Consider adding b12  Regular   multivits     And we can check level .     Lab in a month  Will put in orders.   If  abd pain progressive   Then may need to see GI doc .   Skin Surgery [facility]  [address], [place], [place] [address] [phone]      Standley Brooking. Panosh M.D.

## 2016-02-20 NOTE — Patient Instructions (Addendum)
Let us know IU of vit d  Last check was good.  About 18 months ago  considier  Prolia   As opposed to  Fosamax type medications .   Will arrange   Referral to dr Cruzita Lederer   About risk benefit of medication intervention.   Borderline anemia   Consider adding b12  Regular   multivits     And we can check level .     Lab in a month  Will put in orders.   If  abd pain progressive   Then may need to see GI doc .   Skin Surgery [facility]  [address], [place],  [address] [phone]

## 2016-03-03 ENCOUNTER — Ambulatory Visit (HOSPITAL_COMMUNITY)
Admission: EM | Admit: 2016-03-03 | Discharge: 2016-03-03 | Disposition: A | Discharge status: Home / Self Care | Payer: Medicare Other | Attending: Emergency Medicine | Admitting: Emergency Medicine

## 2016-03-03 ENCOUNTER — Encounter (HOSPITAL_COMMUNITY): Payer: Self-pay | Admitting: Emergency Medicine

## 2016-03-03 DIAGNOSIS — I951 Orthostatic hypotension: Secondary | ICD-10-CM

## 2016-03-03 DIAGNOSIS — S01111A Laceration without foreign body of right eyelid and periocular area, initial encounter: Secondary | ICD-10-CM

## 2016-03-03 NOTE — Discharge Instructions (Signed)
Your fall was likely due to orthostatic hypotension. This is a consequence of how hot it is outside. Your EKG and vital signs are excellent today. It is important to have someone stay with you tonight. If you develop confusion, balance issues, or unequal pupils, you'll need to be taken to the emergency room right away.  Facial Laceration A facial laceration is a cut on the face. These injuries can be painful and cause bleeding. Some cuts may need to be closed with stitches (sutures), skin adhesive strips, or wound glue. Cuts usually heal quickly but can leave a scar. It can take 1-2 years for the scar to go away completely. HOME CARE   Only take medicines as told by your doctor.  Follow your doctor's instructions for wound care. For Wound Glue:  You may shower or take baths. Do not soak or scrub the cut. Do not swim. Avoid heavy sweating until the glue falls off on its own. After a shower or bath, pat the cut dry with a clean towel.  Do not put medicine or makeup on your cut until the glue falls off.  If you have a bandage, do not put tape over the glue.  Avoid lots of sunlight or tanning lamps until the glue falls off.  The glue will fall off on its own in 5-10 days. Do not pick at the glue. After Healing:  Put sunscreen on the cut for the first year to reduce your scar. GET HELP IF:  You have a fever. GET HELP RIGHT AWAY IF:   Your cut area gets red, painful, or puffy (swollen).  You see a yellowish-white fluid (pus) coming from the cut.   This information is not intended to replace advice given to you by your health care provider. Make sure you discuss any questions you have with your health care provider.   Document Released: 02/05/2008 Document Revised: 09/09/2014 Document Reviewed: 04/01/2013 Elsevier Interactive Patient Education [employer].

## 2016-03-03 NOTE — ED Notes (Signed)
patient reports a fall around 4:00 pm.  Patient was outside, had been drinking water.  Patient got up to go into the house and woke on the ground.  Patient says she got up initially, too fast.  Patient called a friend that has escorted her to the urgent care.  Patient has bruising around right eye, small cut to the surrounding tissue.  Bleeding controlled.  Patient and friend report her prescription glasses were crumpled, water bottle is indented.  Denies any other injury.  Friend reports patient is acting at baseline.  Patient is alert and oriented x 4.  Patient follows commands.  Denies chest pain

## 2016-03-03 NOTE — ED Provider Notes (Signed)
CSN: [id number]     Arrival date & time 03/03/16  1648 History   First MD Initiated Contact with Patient 03/03/16 1702     Chief Complaint  Patient presents with  . Loss of Consciousness   (Consider location/radiation/quality/duration/timing/severity/associated sxs/prior Treatment) HPI She is a 75 year old woman here for evaluation of right eyelid laceration after a fall. She fell this afternoon. She states she was sitting on her patio and when she stood up to get more water, she passed out and fell, hitting the right side of her face. She denies any confusion, bowel or bladder incontinence, chest pain, palpitations. She states just prior to the fall she had moved from a sunny seat into a shady seat and she had felt a little lightheaded when she made that move.  When she came to, she called her friend and she, who is present with her, concurs that she is behaving normally. She has told a consistent story of what happened multiple times.  No known cardiac history. She does not take any blood thinners.  Patient does not wish to go to the emergency room.  Past Medical History  Diagnosis Date  . Osteopenia   . 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  . Wears hearing aid   . PONV (postoperative nausea and vomiting)   . Anemia    Past Surgical History  Procedure Laterality Date  . Abdominal hysterectomy  1973    1/2 of ovary left cyst on ovary  . Shoulder rotator culff   2006  . Spinal cord tumor  1987    2012 october   . Appendectomy      removed with hysterectomy  . Rotator cuff repair      left   . Lumbar wound debridement  12/06/2011    Procedure: LUMBAR WOUND DEBRIDEMENT;  Surgeon: Kristeen Miss, MD;  Location: [facility];  Service: Neurosurgery;  Laterality: N/A;  Repair of Pseudomeningocele Lumbar wound   Family History  Problem Relation Age of Onset  . Stroke Mother     2010  . Heart attack Father   . Anesthesia problems Neg Hx    Social  History  Substance Use Topics  . Smoking status: Former Smoker -- 0.25 packs/day for 2 years    Types: Cigarettes    Quit date: 09/03/1967  . Smokeless tobacco: None  . Alcohol Use: 2.4 oz/week    4 Glasses of wine per week   OB History    No data available     Review of Systems As in history of present illness Allergies  Ibuprofen-famotidine and Penicillins  Home Medications   Prior to Admission medications   Medication Sig Start Date End Date Taking? Authorizing Provider  b complex vitamins capsule Take 1 capsule by mouth daily.    Historical Provider, MD  Carbonyl Iron (PERFECT IRON) 25 MG TABS Take 25 mg by mouth daily.    Historical Provider, MD  Cholecalciferol (VITAMIN D3) 5000 UNITS CAPS Take 1 capsule by mouth daily.    Historical Provider, MD  magnesium 30 MG tablet Take 30 mg by mouth 2 (two) times daily.    Historical Provider, MD  Meth-Hyo-M Bl-Na Phos-Ph Sal (URO-MP) 118 MG CAPS Take 118 mg by mouth 2 (two) times daily. 01/11/16   Historical Provider, MD  pregabalin (LYRICA) 25 MG capsule Take 1 capsule (25 mg total) by mouth 2 (two) times daily. 06/12/15   Lyndal Pulley, DO  Meds Ordered and Administered this Visit  Medications - No data to display  BP 144/83 mmHg  Pulse 84  Temp(Src) 98.2 F (36.8 C) (Oral)  Resp 14  SpO2 100% Orthostatic VS for the past 24 hrs:  BP- Lying Pulse- Lying BP- Sitting Pulse- Sitting BP- Standing at 0 minutes Pulse- Standing at 0 minutes  03/03/16 1733 154/81 mmHg 70 170/81 mmHg 75 172/81 mmHg 76    Physical Exam  Constitutional: She is oriented to person, place, and time. She appears well-developed and well-nourished. No distress.  HENT:  Head:    She does have bruising of the lateral upper eyelid and eyebrow area.    Eyes: Pupils are equal, round, and reactive to light. Right conjunctiva is not injected. Right conjunctiva has no hemorrhage. Left conjunctiva is not injected. Left conjunctiva has no hemorrhage.    Cardiovascular: Normal rate, regular rhythm and normal heart sounds.   No murmur heard. Pulmonary/Chest: Effort normal.  Neurological: She is alert and oriented to person, place, and time.    ED Course  .Marland KitchenLaceration Repair Date/Time: 03/03/2016 5:47 PM Performed by: Melony Overly Authorized by: Melony Overly Consent: Verbal consent obtained. Risks and benefits: risks, benefits and alternatives were discussed Consent given by: patient Patient understanding: patient states understanding of the procedure being performed Patient identity confirmed: verbally with patient Time out: Immediately prior to procedure a "time out" was called to verify the correct patient, procedure, equipment, support staff and site/side marked as required. Body area: head/neck Location details: right eyebrow Laceration length: 0.5 cm Irrigation method: jet lavage Amount of cleaning: standard Wound skin closure material used: Dermabond. Approximation: close Approximation difficulty: simple Patient tolerance: Patient tolerated the procedure well with no immediate complications   (including critical care time) ED ECG REPORT   Date: 03/03/2016  Rate: 71  Rhythm: normal sinus rhythm  QRS Axis: normal  Intervals: normal  ST/T Wave abnormalities: normal  Conduction Disutrbances:none  Narrative Interpretation: NSR, normal ekg  Old EKG Reviewed: unchanged  I have personally reviewed the EKG tracing and agree with the computerized printout as noted.  Labs Review Labs Reviewed - No data to display  Imaging Review No results found.   MDM   1. Orthostatic syncope   2. Laceration of right eyebrow, initial encounter    Her fall is likely a result of orthostatic hypotension from sitting out in the heat. She is neurologically intact without confusion or other signs of intracranial injury. She is not on any blood thinners. No cardiac history. EKG is normal. Her son will stay with her tonight.  At this  time, I see no reason to transfer her to the emergency room for additional evaluation. Return precautions reviewed with the patient and her son.  Wound care instructions given for laceration repair.     Melony Overly, MD 03/03/16 [phone]

## 2016-03-25 ENCOUNTER — Encounter (HOSPITAL_COMMUNITY): Payer: Self-pay

## 2016-03-25 ENCOUNTER — Emergency Department (HOSPITAL_COMMUNITY): Payer: Medicare Other

## 2016-03-25 ENCOUNTER — Other Ambulatory Visit (INDEPENDENT_AMBULATORY_CARE_PROVIDER_SITE_OTHER): Payer: Medicare Other

## 2016-03-25 ENCOUNTER — Emergency Department (HOSPITAL_COMMUNITY)
Admission: EM | Admit: 2016-03-25 | Discharge: 2016-03-25 | Disposition: A | Discharge status: Home / Self Care | Payer: Medicare Other | Attending: Emergency Medicine | Admitting: Emergency Medicine

## 2016-03-25 DIAGNOSIS — M810 Age-related osteoporosis without current pathological fracture: Secondary | ICD-10-CM | POA: Diagnosis not present

## 2016-03-25 DIAGNOSIS — G9340 Encephalopathy, unspecified: Secondary | ICD-10-CM | POA: Insufficient documentation

## 2016-03-25 DIAGNOSIS — N390 Urinary tract infection, site not specified: Secondary | ICD-10-CM

## 2016-03-25 DIAGNOSIS — Z86018 Personal history of other benign neoplasm: Secondary | ICD-10-CM | POA: Insufficient documentation

## 2016-03-25 DIAGNOSIS — R4701 Aphasia: Secondary | ICD-10-CM | POA: Diagnosis not present

## 2016-03-25 DIAGNOSIS — D649 Anemia, unspecified: Secondary | ICD-10-CM | POA: Diagnosis not present

## 2016-03-25 DIAGNOSIS — Z87891 Personal history of nicotine dependence: Secondary | ICD-10-CM | POA: Diagnosis not present

## 2016-03-25 DIAGNOSIS — R791 Abnormal coagulation profile: Secondary | ICD-10-CM | POA: Insufficient documentation

## 2016-03-25 DIAGNOSIS — R479 Unspecified speech disturbances: Secondary | ICD-10-CM | POA: Diagnosis present

## 2016-03-25 LAB — ETHANOL: Alcohol, Ethyl (B): <5 mg/dL (ref <5)

## 2016-03-25 LAB — DIFFERENTIAL
Basophils Absolute: 0 10*3/uL (ref 0.0–0.1)
Basophils Relative: 0 %
Eosinophils Absolute: 0 10*3/uL (ref 0.0–0.7)
Eosinophils Relative: 0 %
Lymphocytes Relative: 33 %
Lymphs Abs: 2.7 10*3/uL (ref 0.7–4.0)
Monocytes Absolute: 0.7 10*3/uL (ref 0.1–1.0)
Monocytes Relative: 9 %
Neutro Abs: 4.7 10*3/uL (ref 1.7–7.7)
Neutrophils Relative %: 58 %

## 2016-03-25 LAB — COMPREHENSIVE METABOLIC PANEL
ALT: 19 U/L (ref 14–54)
AST: 23 U/L (ref 15–41)
Albumin: 4.4 g/dL (ref 3.5–5.0)
Alkaline Phosphatase: 58 U/L (ref 38–126)
Anion gap: 11 (ref 5–15)
BUN: 16 mg/dL (ref 6–20)
CO2: 23 mmol/L (ref 22–32)
Calcium: 9.6 mg/dL (ref 8.9–10.3)
Chloride: 89 mmol/L — ABNORMAL LOW (ref 101–111)
Creatinine, Ser: 0.73 mg/dL (ref 0.44–1.00)
GFR calc Af Amer: >60 mL/min (ref >60)
GFR calc non Af Amer: >60 mL/min (ref >60)
Glucose, Bld: 93 mg/dL (ref 65–99)
Potassium: 4.8 mmol/L (ref 3.5–5.1)
Sodium: 123 mmol/L — ABNORMAL LOW (ref 135–145)
Total Bilirubin: 1 mg/dL (ref 0.3–1.2)
Total Protein: 6.3 g/dL — ABNORMAL LOW (ref 6.5–8.1)

## 2016-03-25 LAB — URINALYSIS, ROUTINE W REFLEX MICROSCOPIC
Bilirubin Urine: NEGATIVE
Glucose, UA: NEGATIVE mg/dL
Hgb urine dipstick: NEGATIVE
Ketones, ur: 15 mg/dL — AB
Nitrite: NEGATIVE
Protein, ur: NEGATIVE mg/dL
Specific Gravity, Urine: 1.01 (ref 1.005–1.030)
pH: 6.5 (ref 5.0–8.0)

## 2016-03-25 LAB — I-STAT CHEM 8, ED
BUN: 17 mg/dL (ref 6–20)
Calcium, Ion: 1.11 mmol/L — ABNORMAL LOW (ref 1.12–1.23)
Chloride: 90 mmol/L — ABNORMAL LOW (ref 101–111)
Creatinine, Ser: 0.7 mg/dL (ref 0.44–1.00)
Glucose, Bld: 89 mg/dL (ref 65–99)
HCT: 34 % — ABNORMAL LOW (ref 36.0–46.0)
Hemoglobin: 11.6 g/dL — ABNORMAL LOW (ref 12.0–15.0)
Potassium: 4.9 mmol/L (ref 3.5–5.1)
Sodium: 124 mmol/L — ABNORMAL LOW (ref 135–145)
TCO2: 24 mmol/L (ref 0–100)

## 2016-03-25 LAB — I-STAT TROPONIN, ED: Troponin i, poc: 0.01 ng/mL (ref 0.00–0.08)

## 2016-03-25 LAB — PROTIME-INR
INR: 1.01 (ref 0.00–1.49)
Prothrombin Time: 13.5 seconds (ref 11.6–15.2)

## 2016-03-25 LAB — RAPID URINE DRUG SCREEN, HOSP PERFORMED
Amphetamines: NOT DETECTED
Barbiturates: NOT DETECTED
Benzodiazepines: NOT DETECTED
Cocaine: NOT DETECTED
Opiates: NOT DETECTED
Tetrahydrocannabinol: NOT DETECTED

## 2016-03-25 LAB — CBC
HCT: 32.5 % — ABNORMAL LOW (ref 36.0–46.0)
Hemoglobin: 11 g/dL — ABNORMAL LOW (ref 12.0–15.0)
MCH: 32.4 pg (ref 26.0–34.0)
MCHC: 33.8 g/dL (ref 30.0–36.0)
MCV: 95.6 fL (ref 78.0–100.0)
Platelets: 230 10*3/uL (ref 150–400)
RBC: 3.4 MIL/uL — ABNORMAL LOW (ref 3.87–5.11)
RDW: 12.7 % (ref 11.5–15.5)
WBC: 8.1 10*3/uL (ref 4.0–10.5)

## 2016-03-25 LAB — URINE MICROSCOPIC-ADD ON: RBC / HPF: NONE SEEN RBC/hpf (ref 0–5)

## 2016-03-25 LAB — VITAMIN B12: Vitamin B-12: 476 pg/mL (ref 211–911)

## 2016-03-25 LAB — CBC WITH DIFFERENTIAL/PLATELET
Basophils Absolute: 0 10*3/uL (ref 0.0–0.1)
Basophils Relative: 0.5 % (ref 0.0–3.0)
Eosinophils Absolute: 0 10*3/uL (ref 0.0–0.7)
Eosinophils Relative: 0.4 % (ref 0.0–5.0)
HCT: 34.7 % — ABNORMAL LOW (ref 36.0–46.0)
Hemoglobin: 11.7 g/dL — ABNORMAL LOW (ref 12.0–15.0)
Lymphocytes Relative: 18.1 % (ref 12.0–46.0)
Lymphs Abs: 1.6 10*3/uL (ref 0.7–4.0)
MCHC: 33.6 g/dL (ref 30.0–36.0)
MCV: 98.4 fl (ref 78.0–100.0)
Monocytes Absolute: 0.6 10*3/uL (ref 0.1–1.0)
Monocytes Relative: 6.3 % (ref 3.0–12.0)
Neutro Abs: 6.7 10*3/uL (ref 1.4–7.7)
Neutrophils Relative %: 74.7 % (ref 43.0–77.0)
Platelets: 281 10*3/uL (ref 150.0–400.0)
RBC: 3.53 Mil/uL — ABNORMAL LOW (ref 3.87–5.11)
RDW: 13.6 % (ref 11.5–15.5)
WBC: 8.9 10*3/uL (ref 4.0–10.5)

## 2016-03-25 LAB — FOLATE: Folate: 19.7 ng/mL (ref >5.9)

## 2016-03-25 LAB — CBG MONITORING, ED: Glucose-Capillary: 85 mg/dL (ref 65–99)

## 2016-03-25 LAB — APTT: aPTT: 27 seconds (ref 24–37)

## 2016-03-25 MED ORDER — DEXTROSE 5 % IV SOLN
1.0000 g | Freq: Once | INTRAVENOUS | Status: AC
  Administered 2016-03-25: 1 g via INTRAVENOUS
  Filled 2016-03-25: qty 1

## 2016-03-25 MED ORDER — ACETAMINOPHEN 500 MG PO TABS
1000.0000 mg | ORAL_TABLET | Freq: Once | ORAL | Status: AC
  Administered 2016-03-25: 1000 mg via ORAL
  Filled 2016-03-25: qty 2

## 2016-03-25 MED ORDER — CIPROFLOXACIN HCL 500 MG PO TABS: 500.0000 mg | ORAL_TABLET | Freq: Two times a day (BID) | ORAL | 0 refills | Status: DC

## 2016-03-25 NOTE — ED Provider Notes (Signed)
[facility] DEPT Provider Note   CSN: [id number] Arrival date & time: 03/25/16  3557  First Provider Contact:  First MD Initiated Contact with Patient 03/25/16 1643      An emergency department physician performed an initial assessment on this suspected stroke patient at 1643.  History   Chief Complaint Chief Complaint  Patient presents with  . Code Stroke    HPI Kaitlyn Flowers is a 75 y.o. female.  75 year old female with past medical history including anemia, back pain, recurrent UTI who presents with difficulty speaking. The patient was brought in by a friend. She stated that after running errands today, she got home and at 1615, she had a sudden onset of difficulty speaking with word finding problems, mild confusion, and mild blurry vision. She has never had this problem before. No extremity weakness or numbness. No problems with balance. No vomiting or recent illness. No recent head injury.    The history is provided by the patient.    Past Medical History:  Diagnosis Date  . Anemia   . Osteopenia   . PONV (postoperative nausea and vomiting)   . 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  . Wears hearing aid     Patient Active Problem List   Diagnosis Date Noted  . Aphasia   . Abdominal cramping 01/22/2016  . Nocturnal leg cramps 12/07/2015  . UTI (lower urinary tract infection) 10/02/2015  . 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 Sacroiliitis ([place]) 06/06/2014  . Lumbar radicular pain 06/06/2014  . Right low back pain 01/07/2014  . Anemia 01/07/2014  . Osteoporosis 12/26/2013  . Glucosuria 12/16/2012  . Tick bite of upper arm 12/16/2012  . UTI (urinary tract infection) 12/16/2012  . Back pain with radiation 06/12/2012  . Plantar wart of right foot 12/29/2011  . 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/11/2011  . Fatigue 11/11/2011  . 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 Urinary  frequency 09/02/2011  . Dysuria 09/02/2011  . Medicare annual wellness visit, initial 12/18/2010  . Wears hearing aid 12/18/2010  . Medicare welcome visit 12/18/2010  . Back pain 11/23/2010  . 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 ABDOMINAL BRUIT 07/21/2009  . NEOPLASM, SKIN, UNCERTAIN BEHAVIOR 32/20/2542  . HYPERKALEMIA 05/12/2007  . ANEMIA NOS 05/12/2007  . DISORDER, STOMACH FUNCTION NEC 04/07/2007    Past Surgical History:  Procedure Laterality Date  . ABDOMINAL HYSTERECTOMY  1973   1/2 of ovary left cyst on ovary  . APPENDECTOMY     removed with hysterectomy  . LUMBAR WOUND DEBRIDEMENT  12/06/2011   Procedure: LUMBAR WOUND DEBRIDEMENT;  Surgeon: Kristeen Miss, MD;  Location: [facility] NEUR[facility] OR[facility];  Service: Neurosurgery;  Laterality: N/A;  Repair of Pseudomeningocele Lumbar wound  . ROTATOR CUFF REPAIR     left   . shoulder rotator culff   2006  . spinal cord tumor  1987   2012 october     OB History    No data available       Home Medications    Prior to Admission medications   Medication Sig Start Date End Date Taking? Authorizing Provider  b complex vitamins capsule Take 1 capsule by mouth daily.   Yes Historical Provider, MD  Cholecalciferol (VITAMIN D3) 5000 UNITS CAPS Take 1 capsule by mouth daily.   Yes Historical Provider, MD  Fe Bisgly-Vit C-Vit B12-FA (GENTLE IRON) 28-60-0.008-0.4 MG CAPS Take 1 tablet by mouth daily.  Yes Historical Provider, MD  MAGNESIUM PO Take 1,200 mg by mouth every evening.   Yes Historical Provider, MD  Meth-Hyo-M Bl-Na Phos-Ph Sal (URO-MP) 118 MG CAPS Take 118 mg by mouth 2 (two) times daily. 01/11/16  Yes Historical Provider, MD  pregabalin (LYRICA) 25 MG capsule Take 1 capsule (25 mg total) by mouth 2 (two) times daily. Patient taking differently: Take 25 mg by mouth See admin instructions. TAKES 1 CAP AT BEDTIME, AND THEN 1 CAP AS NEEDED THROUGHOUT THE DAY 06/12/15  Yes Lyndal Pulley, DO  RAPAFLO 8 MG CAPS capsule Take 8 mg by mouth every evening.  03/25/16  Yes Historical Provider, MD    Family History Family History  Problem Relation Age of Onset  . Stroke Mother     2010  . Heart attack Father   . Anesthesia problems Neg Hx     Social History Social History  Substance Use Topics  . Smoking status: Former Smoker    Packs/day: 0.25    Years: 2.00    Types: Cigarettes    Quit date: 09/03/1967  . Smokeless tobacco: Not on file  . Alcohol use 2.4 oz/week    4 Glasses of wine per week     Allergies   Ibuprofen-famotidine and Penicillins   Review of Systems Review of Systems 10 Systems reviewed and are negative for acute change except as noted in the HPI.   Physical Exam Updated Vital Signs BP 139/72   Pulse 77   Temp 98 F (36.7 C) (Oral)   Resp 12   Ht '5\' 5"'  (1.651 m)   Wt 103 lb 9.9 oz (47 kg)   SpO2 100%   BMI 17.24 kg/m   Physical Exam  Constitutional: She is oriented to person, place, and time. She appears well-developed and well-nourished. No distress.  Awake, alert  HENT:  Head: Normocephalic and atraumatic.  Eyes: Conjunctivae and EOM are normal. Pupils are equal, round, and reactive to light.  Neck: Neck supple.  Cardiovascular: Normal rate, regular rhythm and normal heart sounds.   No murmur heard. Pulmonary/Chest: Effort normal and breath sounds normal. No respiratory distress.  Abdominal: Soft. Bowel sounds are normal. She exhibits no distension.  Musculoskeletal: She exhibits no edema.  Neurological: She is alert and oriented to person, place, and time. She has normal reflexes. No cranial nerve deficit. She exhibits normal muscle tone.  Initially, the patient had normal comprehension but was slow to answer and seemed to have word finding difficulty  normal finger-to-nose testing, negative pronator drift, no clonus 5/5 strength and normal sensation x all 4 extremities  Skin: Skin is warm and dry.  Psychiatric: She has a normal mood and affect.  Nursing note and vitals reviewed.    ED  Treatments / Results  Labs (all labs ordered are listed, but only abnormal results are displayed) Labs Reviewed  CBC - Abnormal; Notable for the following:       Result Value   RBC 3.40 (*)    Hemoglobin 11.0 (*)    HCT 32.5 (*)    All other components within normal limits  COMPREHENSIVE METABOLIC PANEL - Abnormal; Notable for the following:    Sodium 123 (*)    Chloride 89 (*)    Total Protein 6.3 (*)    All other components within normal limits  URINALYSIS, ROUTINE W REFLEX MICROSCOPIC (NOT AT [facility]) - Abnormal; Notable for the following:    Color, Urine GREEN (*)    APPearance HAZY (*)  Ketones, ur 15 (*)    Leukocytes, UA MODERATE (*)    All other components within normal limits  URINE MICROSCOPIC-ADD ON - Abnormal; Notable for the following:    Squamous Epithelial / LPF 0-5 (*)    Bacteria, UA MANY (*)    All other components within normal limits  I-STAT CHEM 8, ED - Abnormal; Notable for the following:    Sodium 124 (*)    Chloride 90 (*)    Calcium, Ion 1.11 (*)    Hemoglobin 11.6 (*)    HCT 34.0 (*)    All other components within normal limits  URINE CULTURE  ETHANOL  PROTIME-INR  APTT  DIFFERENTIAL  URINE RAPID DRUG SCREEN, HOSP PERFORMED  I-STAT TROPOININ, ED  CBG MONITORING, ED    EKG  EKG Interpretation  Date/Time:  Monday March 25 2016 17:07:31 EDT Ventricular Rate:  82 PR Interval:    QRS Duration: 93 QT Interval:  371 QTC Calculation: 434 R Axis:   67 Text Interpretation:  Sinus rhythm No significant change since last tracing Confirmed by Swayze Pries MD, Belmira Daley [phone]) on 03/25/2016 5:45:17 PM       Radiology Ct Head Code Stroke Wo Contrast`  Result Date: 03/25/2016 CLINICAL DATA:  Code stroke.  Expressive aphasia. EXAM: CT HEAD WITHOUT CONTRAST TECHNIQUE: Contiguous axial images were obtained from the base of the skull through the vertex without intravenous contrast. COMPARISON:  None. FINDINGS: There is no evidence of acute cortical infarct,  intracranial hemorrhage, mass, midline shift, or extra-axial fluid collection. Mild cerebral atrophy is within normal limits for age. Hypodensities in the deep cerebral white matter bilaterally are nonspecific but compatible with mild chronic small vessel ischemic disease. Orbits are unremarkable. A small amount fluid is present in the left sphenoid sinus. The visualized mastoid air cells are clear. Calcified atherosclerosis is noted at the skullbase. No acute osseous abnormality is identified. IMPRESSION: 1. No evidence of acute intracranial abnormality. 2. Mild chronic small vessel ischemic disease. These results were called by telephone at the time of interpretation on 03/25/2016 at 5:04 pm to Dr. Elson Clan, who verbally acknowledged these results. Electronically Signed   By: Logan Bores M.D.   On: 03/25/2016 17:04   Procedures Procedures (including critical care time)  Medications Ordered in ED Medications  ceFEPIme (MAXIPIME) 1 g in dextrose 5 % 50 mL IVPB (not administered)     Initial Impression / Assessment and Plan / ED Course  I have reviewed the triage vital signs and the nursing notes.  Pertinent labs that were available during my care of the patient were reviewed by me and considered in my medical decision making (see chart for details).  Clinical Course   Patient presents for sudden onset of word finding difficulty and trouble speaking that she noticed at 4:15 today. She was evaluated in triage and a stroke alert was called. When I saw her prior to arrival to the [place], she was having difficulty with word finding but able to follow commands. No unilateral weakness noted. She was met by a neurologist in CT scan and CT head was negative for acute process. On reexamination after getting back from scanner, her speaking had improved and she stated that she felt back to normal. Her neurologic exam was normal at that time. I discussed at length with Dr. Tasia Catchings, who felt that  her symptoms were very unlikely to represent a TIA given that her only deficits were with speech and he suspected a transient global encephalopathy.  He felt that the symptoms could be related to an infectious process such as UTI and did not necessarily warrant further neurology workup with MRI or TIA workup if the patient had evidence of infection. The patient later endorsed 3-4 days of UTI symptoms; she has a long history of frequent UTIs and previously had a pseudomonas infection requiring PICC line and IV Abx. She has denied any back pain, fevers, or vomiting to suggest pyelonephritis. Her workup here is notable only for UA consistent with infection. Urine culture has been sent. She states that she has a follow-up appointment with her urologist in the morning. I provided her with the option of overnight admission for observation with IV antibiotics because of her history of pseudomonal infection, although her most recent U culture showed Enterococcus. She felt that she was back to normal and was comfortable with having a dose of IV antibiotics here and going home with follow-up in 12 hours. Gave cefepime to cover for pseudomonas. Provided with prescription for ciprofloxacin and instructed to see a urologist in the morning and discuss whether he felt this antibiotic was appropriate given her history. I extensively reviewed return precautions with the patient and she voiced understanding. Patient discharged in satisfactory condition.  Final Clinical Impressions(s) / ED Diagnoses   Final diagnoses:  Encephalopathy, unspecified  UTI (lower urinary tract infection)    New Prescriptions New Prescriptions   No medications on file     Sharlett Iles, MD 03/26/16 [phone]

## 2016-03-25 NOTE — Code Documentation (Signed)
75 year old presents to triage with difficulty word finding.  She reports she was out shopping - and had sudden onset difficulty finding her words.  On arrival to ED she was able to speak the correct words just took her longer to articulate them. She did report some blurred vision.  NIHHS at the time of exam was 0 - ED RN reports a 1 for aphasia on arrival.  Dr. Tasia Catchings at bedside - TIA alert.  Handoff to [facility] [employer].

## 2016-03-25 NOTE — ED Notes (Signed)
Pt verbalized understanding of discharge instructions, follow-up instructions, prescriptions; all questions answered by RN

## 2016-03-25 NOTE — Consult Note (Signed)
Initial Neurological Consultation                      NEURO HOSPITALIST CONSULT NOTE   Requestig physician: Dr. Rex Kras   Reason for Consult: Transient difficulty with concentration speech and focus.   HPI:                                                                                                                                          Kaitlyn Flowers is an 75 y.o. female presents today with a history of some mild difficulties with concentration and focus as well as slight blurring of vision she also noted some brief word finding difficulties she was still unable to speak but noticed some difficulty thinking of words that she wanted to say.  Upon arrival in the emergency room she noted gradual improvement in her symptoms. She is presently asymptomatic. On further discussion, Kaitlyn Flowers reports that she has experienced a number of urinary tract infections over the course of the past year she reports these have been quite serious and actually required placement of a PICC line.  Kaitlyn Flowers did not experience any focal neurological complaints of association with the symptoms. She reported no facial weakness and no weakness in the upper or lower extremities.    Past Medical History:  Diagnosis Date  . Anemia   . Osteopenia   . PONV (postoperative nausea and vomiting)   . 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  . Wears hearing aid     Past Surgical History:  Procedure Laterality Date  . ABDOMINAL HYSTERECTOMY  1973   1/2 of ovary left cyst on ovary  . APPENDECTOMY     removed with hysterectomy  . LUMBAR WOUND DEBRIDEMENT  12/06/2011   Procedure: LUMBAR WOUND DEBRIDEMENT;  Surgeon: Kristeen Miss, MD;  Location: [facility] NEURO ORS;  Service: Neurosurgery;  Laterality: N/A;  Repair of Pseudomeningocele Lumbar wound  . ROTATOR CUFF REPAIR     left   . shoulder rotator culff   2006  . spinal cord tumor  1987   2012 october     MEDICATIONS:                                                                                                                      I have reviewed the patient's current medications.  Allergies  Allergen Reactions  . Ibuprofen-Famotidine Other (See Comments)    Elevated BP  . Penicillins Rash     Social History:  reports that she quit smoking about 48 years ago. Her smoking use included Cigarettes. She has a 0.50 pack-year smoking history. She does not have any smokeless tobacco history on file. She reports that she drinks about 2.4 oz of alcohol per week . She reports that she does not use drugs.  Family History  Problem Relation Age of Onset  . Stroke Mother     2010  . Heart attack Father   . Anesthesia problems Neg Hx      ROS:                                                                                                                                       History obtained from chart review  General ROS: negative for - chills, fatigue, fever, night sweats, weight gain or weight loss Psychological ROS: negative for - behavioral disorder, hallucinations, memory difficulties, mood swings or suicidal ideation Ophthalmic ROS: negative for - blurry vision, double vision, eye pain or loss of vision ENT ROS: negative for - epistaxis, nasal discharge, oral lesions, sore throat, tinnitus or vertigo Allergy and Immunology ROS: negative for - hives or itchy/watery eyes Hematological and Lymphatic ROS: negative for - bleeding problems, bruising or swollen lymph nodes Endocrine ROS: negative for - galactorrhea, hair pattern changes, polydipsia/polyuria or temperature intolerance Respiratory ROS: negative for - cough, hemoptysis, shortness of breath or wheezing Cardiovascular ROS: negative for - chest pain, dyspnea on exertion, edema or irregular heartbeat Gastrointestinal ROS: negative for - abdominal pain, diarrhea, hematemesis, nausea/vomiting or stool incontinence Genito-Urinary ROS: negative for - dysuria,  hematuria, incontinence or urinary frequency/urgency Musculoskeletal ROS: negative for - joint swelling or muscular weakness Neurological ROS: as noted in HPI Dermatological ROS: negative for rash and skin lesion changes   General Exam                                                                                                      Blood pressure 160/85, pulse 88, temperature 98 F (36.7 C), temperature source Oral, resp. rate 18, height 5\' 5"  (1.651 m), weight 47 kg (103 lb 9.9 oz), SpO2 100 %. HEENT-  Normocephalic, no lesions, without obvious abnormality.  Normal external eye and conjunctiva.  Normal TM's bilaterally.  Normal auditory canals and external ears. Normal external nose, mucus membranes and septum.  Normal pharynx. Cardiovascular- regular rate  and rhythm, S1, S2 normal, no murmur, click, rub or gallop, pulses palpable throughout   Lungs- chest clear, no wheezing, rales, normal symmetric air entry, Heart exam - S1, S2 normal, no murmur, no gallop, rate regular Abdomen- soft, non-tender; bowel sounds normal; no masses,  no organomegaly Extremities- less then 2 second capillary refill Lymph-no adenopathy palpable Musculoskeletal-no joint tenderness, deformity or swelling Skin-warm and dry, no hyperpigmentation, vitiligo, or suspicious lesions  Neurological Examination Mental Status: Alert, oriented, thought content appropriate.  Speech fluent without evidence of aphasia.  Able to follow 3 step commands without difficulty. Cranial Nerves: II: Discs flat bilaterally; Visual fields grossly normal, pupils equal, round, reactive to light and accommodation III,IV, VI: ptosis not present, extra-ocular motions intact bilaterally V,VII: smile symmetric, facial light touch sensation normal bilaterally VIII: hearing normal bilaterally IX,X: uvula rises symmetrically XI: bilateral shoulder shrug XII: midline tongue extension Motor: Right : Upper extremity   5/5    Left:     Upper  extremity   5/5  Lower extremity   5/5     Lower extremity   5/5 Tone and bulk:normal tone throughout; no atrophy noted Sensory: Pinprick and light touch intact throughout, bilaterally Deep Tendon Reflexes: 2+ and symmetric throughout Plantars: Right: downgoing   Left: downgoing Cerebellar: normal finger-to-nose, normal rapid alternating movements    Lab Results: Basic Metabolic Panel:  Recent Labs Lab 03/25/16 1645 03/25/16 1654  NA 123* 124*  K 4.8 4.9  CL 89* 90*  CO2 23  --   GLUCOSE 93 89  BUN 16 17  CREATININE 0.73 0.70  CALCIUM 9.6  --     Liver Function Tests:  Recent Labs Lab 03/25/16 1645  AST 23  ALT 19  ALKPHOS 58  BILITOT 1.0  PROT 6.3*  ALBUMIN 4.4   No results for input(s): LIPASE, AMYLASE in the last 168 hours. No results for input(s): AMMONIA in the last 168 hours.  CBC:  Recent Labs Lab 03/25/16 0924 03/25/16 1645 03/25/16 1654  WBC 8.9 8.1  --   NEUTROABS 6.7 4.7  --   HGB 11.7* 11.0* 11.6*  HCT 34.7* 32.5* 34.0*  MCV 98.4 95.6  --   PLT 281.0 230  --     Cardiac Enzymes: No results for input(s): CKTOTAL, CKMB, CKMBINDEX, TROPONINI in the last 168 hours.  Lipid Panel: No results for input(s): CHOL, TRIG, HDL, CHOLHDL, VLDL, LDLCALC in the last 168 hours.  CBG:  Recent Labs Lab 03/25/16 1659  GLUCAP 31    Microbiology: Results for orders placed or performed in visit on 11/10/15  Urine Culture     Status: None   Collection Time: 11/10/15 11:55 AM  Result Value Ref Range Status   Colony Count NO GROWTH  Final   Organism ID, Bacteria NO GROWTH  Final    Coagulation Studies:  Recent Labs  03/25/16 1645  LABPROT 13.5  INR 1.01    Imaging: Ct Head Code Stroke Wo Contrast`  Result Date: 03/25/2016 CLINICAL DATA:  Code stroke.  Expressive aphasia. EXAM: CT HEAD WITHOUT CONTRAST TECHNIQUE: Contiguous axial images were obtained from the base of the skull through the vertex without intravenous contrast. COMPARISON:   None. FINDINGS: There is no evidence of acute cortical infarct, intracranial hemorrhage, mass, midline shift, or extra-axial fluid collection. Mild cerebral atrophy is within normal limits for age. Hypodensities in the deep cerebral white matter bilaterally are nonspecific but compatible with mild chronic small vessel ischemic disease. Orbits are unremarkable. A small amount fluid  is present in the left sphenoid sinus. The visualized mastoid air cells are clear. Calcified atherosclerosis is noted at the skullbase. No acute osseous abnormality is identified. IMPRESSION: 1. No evidence of acute intracranial abnormality. 2. Mild chronic small vessel ischemic disease. These results were called by telephone at the time of interpretation on 03/25/2016 at 5:04 pm to Dr. Elson Clan, who verbally acknowledged these results. Electronically Signed   By: Logan Bores M.D.   On: 03/25/2016 17:04   Assessment/Plan:  Estreya is a lovely 75 year old patient who presents today with transient symptom of some difficulty with concentration and focus. She also noted some mild blurring of vision. Despite the symptoms she was able to continue to speak with some word finding difficulty.  Anoushka has undergone CT which was normal. At this time the exam is within the range of normal with no evidence of any aphasia, no motor deficits or focal neurological complaints.  Specific details of this case it appears that Pinch experienced a transient encephalopathy. Differential diagnosis for this is fairly broad. Analiza to have a few minor electrolyte abnormalities she has mild hyponatremia and mild hypocalcemia. No history of recurrent urinary tract infections would be reasonable to check a urinalysis as that could cause a presentation of this nature as well. If the urinalysis is proceeding with an MRI might be of benefit. Although the blood pressure is slightly elevated 160/85, but is not sufficiently high that it would be  likely to use the PRES syndrome.  We discussed the possibility of an ischemic event however this appears more of a transient encephalopathy. The patient was asymptomatic at evaluation. Given this she was not an appropriate candidate for TPA.  Plan:  1 check urinalysis  2. If the urinalysis unremarkable consider MRI brain  Thanks for consulting neurology service to assist in the care of your patient!   Dazani Flowers A. Tasia Catchings, M.D. Neurohospitalist Phone: [phone]  03/25/2016, 5:37 PM

## 2016-03-25 NOTE — ED Notes (Signed)
Called carelink to activate Code Stroke

## 2016-03-25 NOTE — Discharge Instructions (Signed)
You were evaluated today for an episode of confusion and difficulty talking not completely resolved. The CT scan of your head was normal. The neurologist felt that your symptoms were likely due to a temporary encephalopathy/confusion. This probably happened because of the urinary tract infection. Please follow-up with your urologist in the morning to discuss further antibiotic treatment and return immediately if you have any further neurologic symptoms or fever, vomiting, or back pain.

## 2016-03-25 NOTE — ED Notes (Signed)
RN arrived in room to find patient laying on abdomen with head at foot of bed; pt reports she is most comfortable in this position

## 2016-03-25 NOTE — ED Notes (Signed)
Per neurologist patient is now a tia alert, will continue 2 hour neuro checks

## 2016-03-25 NOTE — ED Triage Notes (Signed)
Pt presents to er with friend after running some errands today and all of a sudden could not form her words at 41, er md notified and code stroke called, patient taken straight to ct, patient is still having trouble forming her words in ct

## 2016-03-25 NOTE — ED Notes (Signed)
Pt ambulated to bathroom without assistance, steady gait

## 2016-03-26 LAB — CELIAC PANEL 10
Endomysial Screen: NEGATIVE
Gliadin IgA: 4 Units (ref <20)
Gliadin IgG: 12 Units (ref <20)
IgA: 222 mg/dL (ref 81–463)
Tissue Transglut Ab: 1 U/mL (ref <6)
Tissue Transglutaminase Ab, IgA: 1 U/mL (ref <4)

## 2016-03-26 NOTE — Progress Notes (Signed)
Pre visit review using our clinic review tool, if applicable. No additional management support is needed unless otherwise documented below in the visit note.  Chief Complaint  Patient presents with  . Follow-up    HPI: Kaitlyn Flowers 75 y.o.   Fu   Labs but 22 ed visits     About 4 weeks ago Was outside when it was hot and went in to get something to drink after she stood up the last thing she remembers is been waking up with her head against the concrete. She had a laceration went to the emergency room but doesn't remember any amnesia severe headache or consequences that time.  This week had an episode and she was reading a text message that didn't make sense had visual fogginess and the words did not look  corect couldn't get out words and evenetually couldn't converse but was conscious.    At the ed visit evnetually was ablet to talk and be ok   Onset sudden an  Improved fairly rapidly  No focal weakness but legs weaker feeling poss from there back.   Feels not riight today but better .  Mild headache  Neuro felt not a tia had ct scan  ? From other cause   Had fu urology yesterday  And  Noted .  On med   monural    Dry mouth   drings lots of water to stay hudrated since   UTIS .   Ha since left hospital  Mild   Cold a lot of time cold ontolerance   hyponatrmia in ed   Sudden  Onset.  Trying to read.    5 minutes  .  Couldn't say words and by  Ed almost couldnt speak    -ROS: See pertinent positives and negatives per HPI. Has noted dry mouth since utis  And drinks  4 waters per day ( bottles )  No cps ob  Dizziness or med change   Past Medical History:  Diagnosis Date  . Anemia   . Osteopenia   . PONV (postoperative nausea and vomiting)   . 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  . Wears hearing aid     Family History  Problem Relation Age of Onset  . Stroke Mother     2010  . Heart attack Father   . Anesthesia problems Neg Hx      Social History   Social History  . Marital status: Legally Separated    Spouse name: N/A  . Number of children: N/A  . Years of education: N/A   Social History Main Topics  . Smoking status: Former Smoker    Packs/day: 0.25    Years: 2.00    Types: Cigarettes    Quit date: 09/03/1967  . Smokeless tobacco: None  . Alcohol use 2.4 oz/week    4 Glasses of wine per week  . Drug use: No  . Sexual activity: Not Asked   Other Topics Concern  . 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 b complex vitamins capsule Take 1 capsule by mouth daily.    . Cholecalciferol (VITAMIN D3) 5000 UNITS CAPS Take 1 capsule by mouth daily.    Marland Kitchen MAGNESIUM PO Take  1,200 mg by mouth every evening.    . Meth-Hyo-M Bl-Na Phos-Ph Sal (URO-MP) 118 MG CAPS Take 118 mg by mouth 2 (two) times daily.  3  . pregabalin (LYRICA) 25 MG capsule Take 1 capsule (25 mg total) by mouth 2 (two) times daily. (Patient taking differently: Take 25 mg by mouth See admin instructions. TAKES 1 CAP AT BEDTIME, AND THEN 1 CAP AS NEEDED THROUGHOUT THE DAY) 180 capsule 0  . RAPAFLO 8 MG CAPS capsule Take 8 mg by mouth every evening.    . ciprofloxacin (CIPRO) 500 MG tablet Take 1 tablet (500 mg total) by mouth 2 (two) times daily. 14 tablet 0  . Fe Bisgly-Vit C-Vit B12-FA (GENTLE IRON) 28-60-0.008-0.4 MG CAPS Take 1 tablet by mouth daily.     No facility-administered medications prior to visit.      EXAM:  BP 138/70 (BP Location: Right Arm, Patient Position: Sitting, Cuff Size: Small)   Temp 97.9 F (36.6 C) (Oral)   Ht '5\' 5"'$  (1.651 m)   Wt 103 lb 9.6 oz (47 kg)   BMI 17.24 kg/m   Body mass index is 17.24 kg/m.  GENERAL: vitals reviewed and listed above, alert, oriented, appears well hydrated and in no acute distress HEENT:  atraumatic, conjunctiva  clear, no obvious abnormalities on inspection of external nose and ears OP : no lesion edema or exudate tongue midline prefers to stand NECK: no obvious masses on inspection palpation  LUNGS: clear to auscultation bilaterally, no wheezes, rales or rhonchi,CV: HRRR, no clubbing cyanosis or  peripheral edema nl cap refill  Abdomen:  Sof,t normal bowel sounds without hepatosplenomegaly, no guarding rebound or masses no CVA tenderness  MS: moves all extremities without noticeable focal  Abnormality patch on back  Neuro non focal  Memory cognitive testing not done PSYCH: pleasant and cooperative, no obvious depression or anxiety Lab Results  Component Value Date   WBC 8.1 03/25/2016   HGB 11.6 (L) 03/25/2016   HCT 34.0 (L) 03/25/2016   PLT 230 03/25/2016   GLUCOSE 89 03/25/2016   CHOL 231 (H) 11/27/2010   TRIG 41.0 11/27/2010   HDL 101.80 11/27/2010   LDLDIRECT 110.1 11/27/2010   LDLCALC 110 (H) 04/09/2007   ALT 19 03/25/2016   AST 23 03/25/2016   NA 124 (L) 03/25/2016   K 4.9 03/25/2016   CL 90 (L) 03/25/2016   CREATININE 0.70 03/25/2016   BUN 17 03/25/2016   CO2 23 03/25/2016   TSH 1.28 02/13/2016   INR 1.01 03/25/2016    ASSESSMENT AND PLAN:  Discussed the following assessment and plan:  Episode of transient neurologic symptoms - Plan: Basic metabolic panel, Osmolality, ANA, Sjogren's syndrome antibods(ssa + ssb), C-reactive protein, Ambulatory referral to Neurology, ECHOCARDIOGRAM COMPLETE  Hyponatremia - Plan: Basic metabolic panel, Osmolality, ANA, Sjogren's syndrome antibods(ssa + ssb), C-reactive protein  Dry mouth - newer ionset  inc water but not seeming enought o cause hyponatremia  - Plan: Basic metabolic panel, Osmolality, ANA, Sjogren's syndrome antibods(ssa + ssb), C-reactive protein  History of aphasia - Plan: Basic metabolic panel, Osmolality, ANA, Sjogren's syndrome antibods(ssa + ssb), C-reactive protein, Ambulatory referral to  Neurology, ECHOCARDIOGRAM COMPLETE  UTI (lower urinary tract infection)  H/O syncope - Plan: Basic metabolic panel, Osmolality, ANA, Sjogren's syndrome antibods(ssa + ssb), C-reactive protein, Ambulatory referral to Neurology, ECHOCARDIOGRAM COMPLETE    Patient Instructions   Blood tests today to repeat your sodium level. Avoid excess water intake but I don't think that is the problem. You'll  be contacted about a neurology consult and an echocardiogram. I can't say really that the UTI caused all of your symptoms. Some people have episodes and it never recurs but we need more information consideration of an MRI of your brain.  Plan follow-up depending on results. see summary of  Last ed visit  Total visit 40 mins > 50% spent counseling and coordinating care as indicated in above note and in instructions to patient . And plan for follow up     Clinical Course   Patient presents for sudden onset of word finding difficulty and trouble speaking that she noticed at 4:15 today. She was evaluated in triage and a stroke alert was called. When I saw her prior to arrival to the [facility], she was having difficulty with word finding but able to follow commands. No unilateral weakness noted. She was met by a neurologist in CT scan and CT head was negative for acute process. On reexamination after getting back from scanner, her speaking had improved and she stated that she felt back to normal. Her neurologic exam was normal at that time. I discussed at length with Dr. Tasia Catchings, who felt that her symptoms were very unlikely to represent a TIA given that her only deficits were with speech and he suspected a transient global encephalopathy. He felt that the symptoms could be related to an infectious process such as UTI and did not necessarily warrant further neurology workup with MRI or TIA workup if the patient had evidence of infection. The patient later endorsed 3-4 days of UTI symptoms; she has a long  history of frequent UTIs and previously had a pseudomonas infection requiring PICC line and IV Abx. She has denied any back pain, fevers, or vomiting to suggest pyelonephritis. Her workup here is notable only for UA consistent with infection. Urine culture has been sent. She states that she has a follow-up appointment with her urologist in the morning. I provided her with the option of overnight admission for observation with IV antibiotics because of her history of pseudomonal infection, although her most recent U culture showed Enterococcus. She felt that she was back to normal and was comfortable with having a dose of IV antibiotics here and going home with follow-up in 12 hours. Gave cefepime to cover for pseudomonas. Provided with prescription for ciprofloxacin and instructed to see a urologist in the morning and discuss whether he felt this antibiotic was appropriate given her history. I extensively reviewed return precautions with the patient and she voiced understanding. Patient discharged in satisfactory condition.  Final Clinical Impressions(s) / ED Diagnoses   Final diagnoses:  Encephalopathy, unspecified  UTI (lower urinary tract infection)    New Prescriptions    New Prescriptions   No medications on file     Sharlett Iles, MD 03/26/16 0118   Standley Brooking. Panosh M.D.

## 2016-03-27 ENCOUNTER — Ambulatory Visit (INDEPENDENT_AMBULATORY_CARE_PROVIDER_SITE_OTHER): Payer: Medicare Other | Admitting: Internal Medicine

## 2016-03-27 ENCOUNTER — Encounter: Payer: Self-pay | Admitting: Internal Medicine

## 2016-03-27 VITALS — BP 138/70 | Temp 97.9°F | Ht 65.0 in | Wt 103.6 lb

## 2016-03-27 DIAGNOSIS — R682 Dry mouth, unspecified: Secondary | ICD-10-CM

## 2016-03-27 DIAGNOSIS — Z87898 Personal history of other specified conditions: Secondary | ICD-10-CM | POA: Diagnosis not present

## 2016-03-27 DIAGNOSIS — R2990 Unspecified symptoms and signs involving the nervous system: Secondary | ICD-10-CM

## 2016-03-27 DIAGNOSIS — E871 Hypo-osmolality and hyponatremia: Secondary | ICD-10-CM | POA: Diagnosis not present

## 2016-03-27 DIAGNOSIS — Z9189 Other specified personal risk factors, not elsewhere classified: Secondary | ICD-10-CM

## 2016-03-27 DIAGNOSIS — R29818 Other symptoms and signs involving the nervous system: Secondary | ICD-10-CM

## 2016-03-27 DIAGNOSIS — N390 Urinary tract infection, site not specified: Secondary | ICD-10-CM

## 2016-03-27 LAB — IMMUNOFIXATION ELECTROPHORESIS
IgA: 222 mg/dL (ref 81–463)
IgG (Immunoglobin G), Serum: 554 mg/dL — ABNORMAL LOW (ref 694–1618)
IgM, Serum: 56 mg/dL (ref 48–271)

## 2016-03-27 LAB — BASIC METABOLIC PANEL
BUN: 11 mg/dL (ref 6–23)
CO2: 30 mEq/L (ref 19–32)
Calcium: 9.6 mg/dL (ref 8.4–10.5)
Chloride: 90 mEq/L — ABNORMAL LOW (ref 96–112)
Creatinine, Ser: 0.63 mg/dL (ref 0.40–1.20)
GFR: 98.2 mL/min (ref >60.00)
Glucose, Bld: 89 mg/dL (ref 70–99)
Potassium: 4.7 mEq/L (ref 3.5–5.1)
Sodium: 126 mEq/L — ABNORMAL LOW (ref 135–145)

## 2016-03-27 LAB — C-REACTIVE PROTEIN: CRP: 0.1 mg/dL — ABNORMAL LOW (ref 0.5–20.0)

## 2016-03-27 NOTE — Patient Instructions (Signed)
  Blood tests today to repeat your sodium level. Avoid excess water intake but I don't think that is the problem. You'll be contacted about a neurology consult and an echocardiogram. I can't say really that the UTI caused all of your symptoms. Some people have episodes and it never recurs but we need more information consideration of an MRI of your brain.  Plan follow-up depending on results.

## 2016-03-28 LAB — SJOGREN'S SYNDROME ANTIBODS(SSA + SSB)
SSA (Ro) (ENA) Antibody, IgG: <1
SSB (La) (ENA) Antibody, IgG: <1

## 2016-03-28 LAB — OSMOLALITY: Osmolality: 265 mOsm/kg — ABNORMAL LOW (ref 278–305)

## 2016-03-28 LAB — ANA: Anti Nuclear Antibody(ANA): NEGATIVE

## 2016-03-29 LAB — URINE CULTURE: Culture: >=100000 — AB

## 2016-03-30 ENCOUNTER — Telehealth (HOSPITAL_BASED_OUTPATIENT_CLINIC_OR_DEPARTMENT_OTHER): Payer: Self-pay

## 2016-03-30 NOTE — Telephone Encounter (Signed)
Post ED Visit - Positive Culture Follow-up  Culture report reviewed by antimicrobial stewardship pharmacist:  []  Elenor Quinones, Pharm.D. []  Heide Guile, Pharm.D., BCPS []  Parks Neptune, Pharm.D. []  Alycia Rossetti, Pharm.D., BCPS []  [place], Pharm.D., BCPS, AAHIVP []  Legrand Como, Pharm.D., BCPS, AAHIVP []  Cassie Stewart, Pharm.D. []  Rob Evette Doffing, Pharm.D. Ebony Hail Masters Pharm D Positive urine culture Treated with Ciprofloxacin, organism sensitive to the same and no further patient follow-up is required at this time.  Genia Del 03/30/2016, 9:52 AM

## 2016-04-01 ENCOUNTER — Ambulatory Visit (INDEPENDENT_AMBULATORY_CARE_PROVIDER_SITE_OTHER): Payer: Medicare Other | Admitting: Internal Medicine

## 2016-04-01 ENCOUNTER — Encounter: Payer: Self-pay | Admitting: Internal Medicine

## 2016-04-01 VITALS — BP 118/62 | HR 88 | Ht 61.0 in | Wt 102.0 lb

## 2016-04-01 DIAGNOSIS — M810 Age-related osteoporosis without current pathological fracture: Secondary | ICD-10-CM

## 2016-04-01 DIAGNOSIS — E871 Hypo-osmolality and hyponatremia: Secondary | ICD-10-CM | POA: Diagnosis not present

## 2016-04-01 NOTE — Patient Instructions (Signed)
Please reduce your fluid intake to at most 1.5 L (50 oz).  Please come back for a visit in 4 months.   Please come for labs at 8 am, fasting.

## 2016-04-01 NOTE — Progress Notes (Signed)
Patient ID: Kaitlyn Flowers, female   DOB: [birth year], 75 y.o.   MRN: [id number]   HPI  Kaitlyn Flowers is a 75 y.o.-year-old female, returning for f/u for of osteoporosis and a new dx of hyponatremia. Last visit 2 years ago.  OP: Reviewed and addended hx: Pt was dx with OP in 2015. She denies any falls but broke her R ankle roller skating.   No dizziness/vertigo/orthostasis.  I reviewed pt's DEXA scans:   Lumbar spine (L1-L4) Femoral neck (FN)  T-score - 1.9  RFN: - 2.5 LFN: - 2.7  Change in BMD from previous DXA test (%) - 1.9%  + 0.4%   (*) statistically significant  Prev: Date L1-L4 T score FN T score 33% distal Radius  12/10/2013 -1.7 RFN: -2.7 LFN: -2.5 n/a   L1-L4 decreased by 5.8% from 2012 Total mean hip BMD decreased 7.7%* from 2012    I suggested Fosamax 70 mg weekly at last visit >> did not start.  No h/o hyper/hypocalcemia. No h/o hyperparathyroidism. No h/o kidney stones. Lab Results  Component Value Date   PTH 59 02/13/2016   PTH 66 (H) 07/11/2014   CALCIUM 9.6 03/27/2016   CALCIUM 9.6 03/25/2016   CALCIUM 9.8 02/13/2016   CALCIUM 9.3 02/13/2016   CALCIUM 10.1 11/02/2015   CALCIUM 10.0 07/11/2014   CALCIUM 9.8 07/11/2014   CALCIUM 9.7 02/10/2014   CALCIUM 8.6 08/24/2012   CALCIUM 10.0 08/22/2012   No h/o thyrotoxicosis. Reviewed TSH recent levels:  Lab Results  Component Value Date   TSH 1.28 02/13/2016   TSH 1.21 07/11/2014   TSH 1.11 11/11/2011   TSH 1.12 11/27/2010   TSH 1.17 04/09/2007   + h/o vitamin D deficiency.  She takes 5000 IU vit D daily.  She does take calcium supplements: calcium citrate. She eats dairy and green, leafy, vegetables.   No h/o CKD. Last BUN/Cr: Lab Results  Component Value Date   BUN 11 03/27/2016   CREATININE 0.63 03/27/2016   + weight bearing exercises: walks, Pilates.  She does not take high vitamin A doses. She takes vitamin K.  Pt does have a FH of osteoporosis in mother/sisters. + fractures:  kyphosis in mother.   Menopause was at 26 - surgical TAH + BSO  Hyponatremia:  Pt had orthostatic syncope 03/03/2016 >> ED. She then returned to ED with diffic. Speaking, mild confusion and blurry vision on 03/25/2016 >> CT head neg. for stroke >> assumed to be a TIA or neuro changes from UTI. Na was 123 then.  She started to have UTIs ~ 5 mo ago (pseudomonas), had PICC line >> then oral ABx. She had several recurrences.  She had a urinary catheter - which she was using 3x a day as it was suspected that she could not empty the bladder well. The frequency was then reduced to every other day, but she developed a new UTI, and now she is again cathing every day.  She started to drink more water in last month as she developed dry mouth. She was also trying to increase water intake with her UTIs.  I and Os: - ~68 oz in (~2L) - ~65 oz out (~1.9L)  - started in 03/2016: Lab Results  Component Value Date   NA 126 (L) 03/27/2016   NA 124 (L) 03/25/2016   NA 123 (L) 03/25/2016   NA 135 02/13/2016   NA 135 11/02/2015   NA 135 07/11/2014   NA 132 (L) 02/10/2014   NA 139 08/24/2012  NA 138 08/22/2012   NA 140 11/11/2011   NA 139 11/27/2010   NA 140 06/09/2007   NA 141 04/09/2007   TFTs normal: Lab Results  Component Value Date   TSH 1.28 02/13/2016   Serum osmolality: 268 (<275)   ROS: Constitutional: no weight gain/loss, no fatigue, no subjective hyperthermia/hypothermia, + nocturia x1  Eyes: no blurry vision, no xerophthalmia ENT: no sore throat, no nodules palpated in throat, no dysphagia/odynophagia, no hoarseness, + decreased hearing Cardiovascular: no CP/SOB/palpitations/leg swelling Respiratory: no cough/SOB Gastrointestinal: no N/V/D/C Musculoskeletal: no muscle/+joint aches (neck) Skin: no rashes Neurological: no tremors/numbness/tingling/dizziness, + see history of present illness  I reviewed pt's medications, allergies, PMH, social hx, family hx, and changes were  documented in the history of present illness. Otherwise, unchanged from my initial visit note.  Past Medical History:  Diagnosis Date  . Anemia   . Osteopenia   . PONV (postoperative nausea and vomiting)   . 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  . Wears hearing aid    Past Surgical History:  Procedure Laterality Date  . ABDOMINAL HYSTERECTOMY  1973   1/2 of ovary left cyst on ovary  . APPENDECTOMY     removed with hysterectomy  . LUMBAR WOUND DEBRIDEMENT  12/06/2011   Procedure: LUMBAR WOUND DEBRIDEMENT;  Surgeon: Kristeen Miss, MD;  Location: [facility];  Service: Neurosurgery;  Laterality: N/A;  Repair of Pseudomeningocele Lumbar wound  . ROTATOR CUFF REPAIR     left   . shoulder rotator culff   2006  . spinal cord tumor  1987   2012 october    History   Social History  . Marital Status: married    Spouse Name: N/A    Number of Children: 2   Occupational History  . consultant   Social History Main Topics  . Smoking status: Former Smoker -- 0.25 packs/day for 2 years    Types: Cigarettes    Quit date: 09/03/1967  . Smokeless tobacco: Not on file  . Alcohol Use: 2.4 oz/week    4-5 Glasses of wine per week  . 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 b complex vitamins capsule Take 1 capsule by mouth daily.    . Cholecalciferol (VITAMIN D3) 5000 UNITS CAPS Take 1 capsule by mouth daily.    Marland Kitchen MAGNESIUM PO Take 1,200 mg by mouth every evening.    . Meth-Hyo-M Bl-Na Phos-Ph Sal (URO-MP) 118 MG CAPS Take 118 mg by mouth 2 (two) times daily.  3  . pregabalin (LYRICA) 25 MG capsule Take 1 capsule (25 mg total) by mouth 2 (two) times daily. (Patient taking differently: Take 25 mg by mouth See admin instructions. TAKES 1 CAP AT  BEDTIME, AND THEN 1 CAP AS NEEDED THROUGHOUT THE DAY) 180 capsule 0  . RAPAFLO 8 MG CAPS capsule Take 8 mg by mouth every evening.     No current facility-administered medications on file prior to visit.    Allergies  Allergen Reactions  . Ibuprofen-Famotidine Other (See Comments)    Elevated BP  . Penicillins Rash   Family History  Problem Relation Age of Onset  . Stroke Mother     2010  . Heart  attack Father   . Anesthesia problems Neg Hx    PE: BP 118/62 (BP Location: Left Arm, Patient Position: Sitting)   Pulse 88   Ht 5\' 1"  (1.549 m)   Wt 102 lb (46.3 kg)   SpO2 98%   BMI 19.27 kg/m  Wt Readings from Last 3 Encounters:  04/01/16 102 lb (46.3 kg)  03/27/16 103 lb 9.6 oz (47 kg)  03/25/16 103 lb 9.9 oz (47 kg)   Constitutional: normal weight, in NAD. No kyphosis. Eyes: PERRLA, EOMI, no exophthalmos ENT: moist mucous membranes, no thyromegaly, no cervical lymphadenopathy Cardiovascular: RRR, No MRG Respiratory: CTA B Gastrointestinal: abdomen soft, NT, ND, BS+ Musculoskeletal: no deformities, strength intact in all 4 Skin: moist, warm, no rashes Neurological: no tremor with outstretched hands, DTR normal in all 4  Assessment: 1. Osteoporosis  2. Hyponatremia  Plan: 1. Osteoporosis - likely postmenopausal  - Discussed about increased risk of fracture, depending on the T score, greatly increased when the T score is lower than -2.5, but it is actually a continuum and -2.5 should not be regarded as an absolute threshold.  - We reviewed her last DEXA scan report and images together, and I explained that the T scores appear stable - she would really want to avoid osteoporosis medication and she inquires about strontium. I advised her that strontium ranelate can help osteoporosis, although it is not available in [place] - we reviewed her dietary and supplemental calcium and vitamin D intake - we will check her vitamin D today - no falls since last visit, she is trying to  exercise, however, has significant back pain which limits this  - will see pt back in a year and we will check another DEXA scan 2 years after previous  2. Hyponatremia - new to me - reviewed patient's chart: She developed hyponatremia abruptly in 03/2016. She presented initially with orthostatic syncope, and then with difficulty speaking/mild confusion/blurry vision in the setting of UTI. Her head CT was normal and her symptoms resolved. At that time, sodium was 123, which was new to her. Subsequent sodium levels have been improving, however, still low. - Patient describes repeated UTIs in the last few months, due to which, she increase her water intake. She also developed dry mouth which is causing her to drink even more. As of now, she is drinking approximately 2 L of water a day. I suspect that her sudden hyponatremia is due to increased free water intake, which overwhelmed the kidneys capacity to eliminate it. However, will also look into possible SIADH and we'll check the following labs - she will come tomorrow morning, fasting: Orders Placed This Encounter  Procedures  . Cortisol  . ACTH  . VITAMIN D 25 Hydroxy (Vit-D Deficiency, Fractures)  . Uric acid  . Osmolality  . Osmolality, urine  . Potassium, urine, random  . Sodium, urine, random  . Basic metabolic panel  . Lipid panel  - for now, I advised the patient to reduce her water intake to less than 1.5 L a day - I plan to have her back In a week to recheck her sodium, and possible further labs, depending on the above results  - time spent with the patient: 45 minute, of which >50% was spent in obtaining information about her symptoms, reviewing her previous labs, evaluations, and treatments, counseling her about her 2 conditions (please see the discussed topics above), and developing a plan to further investigate and treat them; she had a number of questions  which I addressed.  Component     Latest Ref Rng & Units 04/02/2016           Sodium     135 - 145 mEq/L 132 (L)  Potassium     3.5 - 5.1 mEq/L 4.7  Chloride     96 - 112 mEq/L 96  CO2     19 - 32 mEq/L 29  Glucose     70 - 99 mg/dL 90  BUN     6 - 23 mg/dL 10  Creatinine     0.40 - 1.20 mg/dL 0.63  Calcium     8.4 - 10.5 mg/dL 9.9  GFR     >60.00 mL/min 98.19  Cholesterol     0 - 200 mg/dL 241 (H)  Triglycerides     0.0 - 149.0 mg/dL 78.0  HDL Cholesterol     >39.00 mg/dL 111.30  VLDL     0.0 - 40.0 mg/dL 15.6  LDL (calc)     0 - 99 mg/dL 114 (H)  Total CHOL/HDL Ratio      2  NonHDL      129.47  VITD     30.00 - 100.00 ng/mL 94.31  Osmolality     278 - 305 mOsm/kg 271 (L)  Cortisol, Plasma     ug/dL 10.2  C206 ACTH     6 - 50 pg/mL 16  Uric Acid, Serum     2.4 - 7.0 mg/dL 2.7  Osmolality, Urine     50 - 1,200 mOsm/kg 304  Potassium Urine     12 - 129 mmol/L 28  Sodium, Urine     28 - 272 mmol/L 77   Labs point towards improving SIADH/water O/L without appropriate renal water wasting. Na is better. No intervention needed for now aside fluid restriction. She will need a new Na check in 6 weeks.  Philemon Kingdom, MD PhD [facility] Endocrinology

## 2016-04-02 ENCOUNTER — Other Ambulatory Visit (INDEPENDENT_AMBULATORY_CARE_PROVIDER_SITE_OTHER): Payer: Medicare Other

## 2016-04-02 DIAGNOSIS — E871 Hypo-osmolality and hyponatremia: Secondary | ICD-10-CM | POA: Diagnosis not present

## 2016-04-02 LAB — BASIC METABOLIC PANEL
BUN: 10 mg/dL (ref 6–23)
CO2: 29 mEq/L (ref 19–32)
Calcium: 9.9 mg/dL (ref 8.4–10.5)
Chloride: 96 mEq/L (ref 96–112)
Creatinine, Ser: 0.63 mg/dL (ref 0.40–1.20)
GFR: 98.19 mL/min (ref >60.00)
Glucose, Bld: 90 mg/dL (ref 70–99)
Potassium: 4.7 mEq/L (ref 3.5–5.1)
Sodium: 132 mEq/L — ABNORMAL LOW (ref 135–145)

## 2016-04-02 LAB — LIPID PANEL
Cholesterol: 241 mg/dL — ABNORMAL HIGH (ref 0–200)
HDL: 111.3 mg/dL (ref >39.00)
LDL Cholesterol: 114 mg/dL — ABNORMAL HIGH (ref 0–99)
NonHDL: 129.47
Total CHOL/HDL Ratio: 2
Triglycerides: 78 mg/dL (ref 0.0–149.0)
VLDL: 15.6 mg/dL (ref 0.0–40.0)

## 2016-04-02 LAB — CORTISOL: Cortisol, Plasma: 10.2 ug/dL

## 2016-04-02 LAB — VITAMIN D 25 HYDROXY (VIT D DEFICIENCY, FRACTURES): VITD: 94.31 ng/mL (ref 30.00–100.00)

## 2016-04-02 LAB — URIC ACID: Uric Acid, Serum: 2.7 mg/dL (ref 2.4–7.0)

## 2016-04-03 LAB — OSMOLALITY, URINE: Osmolality, Ur: 304 mOsm/kg (ref 50–1200)

## 2016-04-03 LAB — POTASSIUM, URINE, RANDOM: Potassium Urine: 28 mmol/L (ref 12–129)

## 2016-04-03 LAB — OSMOLALITY: Osmolality: 271 mOsm/kg — ABNORMAL LOW (ref 278–305)

## 2016-04-03 LAB — SODIUM, URINE, RANDOM: Sodium, Ur: 77 mmol/L (ref 28–272)

## 2016-04-05 LAB — ACTH: C206 ACTH: 16 pg/mL (ref 6–50)

## 2016-04-25 ENCOUNTER — Ambulatory Visit (HOSPITAL_COMMUNITY): Payer: Medicare Other | Attending: Cardiology

## 2016-04-25 ENCOUNTER — Other Ambulatory Visit: Payer: Self-pay

## 2016-04-25 DIAGNOSIS — Z9189 Other specified personal risk factors, not elsewhere classified: Secondary | ICD-10-CM

## 2016-04-25 DIAGNOSIS — I340 Nonrheumatic mitral (valve) insufficiency: Secondary | ICD-10-CM | POA: Diagnosis not present

## 2016-04-25 DIAGNOSIS — Z87898 Personal history of other specified conditions: Secondary | ICD-10-CM

## 2016-04-25 DIAGNOSIS — R2990 Unspecified symptoms and signs involving the nervous system: Secondary | ICD-10-CM

## 2016-04-25 DIAGNOSIS — R55 Syncope and collapse: Secondary | ICD-10-CM

## 2016-04-25 DIAGNOSIS — R29818 Other symptoms and signs involving the nervous system: Secondary | ICD-10-CM

## 2016-04-26 ENCOUNTER — Telehealth: Payer: Self-pay

## 2016-04-26 NOTE — Telephone Encounter (Signed)
Called patient to give lab results. No answer. Will try later.  

## 2016-04-26 NOTE — Telephone Encounter (Signed)
-----   Message from Burnis Medin, MD sent at 04/26/2016  8:15 AM EDT ----- Normal echo cardiogram    Normal heart and valve function

## 2016-05-29 ENCOUNTER — Encounter: Payer: Self-pay | Admitting: Neurology

## 2016-05-29 ENCOUNTER — Ambulatory Visit (INDEPENDENT_AMBULATORY_CARE_PROVIDER_SITE_OTHER): Payer: Medicare Other | Admitting: Neurology

## 2016-05-29 VITALS — BP 134/70 | HR 59 | Temp 97.9°F | Ht 61.0 in | Wt 103.5 lb

## 2016-05-29 DIAGNOSIS — E785 Hyperlipidemia, unspecified: Secondary | ICD-10-CM

## 2016-05-29 DIAGNOSIS — G459 Transient cerebral ischemic attack, unspecified: Secondary | ICD-10-CM

## 2016-05-29 DIAGNOSIS — M5416 Radiculopathy, lumbar region: Secondary | ICD-10-CM

## 2016-05-29 DIAGNOSIS — R55 Syncope and collapse: Secondary | ICD-10-CM

## 2016-05-29 MED ORDER — ASPIRIN EC 81 MG PO TBEC: 81.0000 mg | DELAYED_RELEASE_TABLET | Freq: Every day | ORAL | 11 refills | Status: DC

## 2016-05-29 NOTE — Progress Notes (Signed)
NEUROLOGY CONSULTATION NOTE  [name] MRN: [id number] DOB: 1940/10/12  Referring provider: Dr. Shanon Ace Primary care provider: Dr. Shanon Ace  Reason for consult:  History of aphasia and syncope  [name]:  Thank you for your kind referral of Kaitlyn Flowers for consultation of the above symptoms. Although her history is well known to you, please allow me to reiterate it for the purpose of our medical record. Records and images were personally reviewed where available.  HISTORY OF PRESENT ILLNESS: This is a very pleasant 75 year old right-handed woman presenting after a syncopal episode on 03/03/16 then a transient episode of aphasia on 03/25/16. She lives alone and was sitting outside last 03/03/16. She recalls turning around to get water, then blacked out. She woke up on the ground and found her glasses broken, she hit the right side of her face. No tongue bite/incontinence. She denied any prodromal symptoms, no dizziness or headaches. She had been drinking a lot of water due to recurrent UTIs since January 2017. She went to the ER, it was felt fall was likely a result of orthostatic hypotension from sitting out in the heat. No bloodwork on record from that visit. Then on 03/25/16 she was reading something off her phone and could not make sense of it. She showed the phone to her friend who said it looked fine. Then she found she could not "get/grab my words." Her friend drove her to the ER, she felt her friend was driving the wrong way and she was thinking this, but could not say it. This lasted 20-30 minutes, no associated focal numbness/tingling/weakness, headaches, or dizziness. Her BP on arrival was 139/72. I personally reviewed head CT without contrast which did not show any acute changes. She was found to have a sodium level of 123. She was also diagnosed with another UTI with >100,000 aerococcus urinae. She denies any further recurrence of symptoms. She denies any  olfactory/gustatory hallucinations, deja vu, rising epigastric sensation, focal numbness/tingling/weakness, myoclonic jerks. She has chronic back pain and self-catheterizes herself every night. She has chronic right leg numbness and weakness. For the past 1-1/2 years, she has had pain the right sacral area, worsened by sitting. She continues to work with PT.   Her mother had a stroke. Otherwise she had a normal birth and early development.  There is no history of febrile convulsions, CNS infections such as meningitis/encephalitis, significant traumatic brain injury, neurosurgical procedures, or family history of seizures.  Laboratory Data: Lab Results  Component Value Date   WBC 8.1 03/25/2016   HGB 11.6 (L) 03/25/2016   HCT 34.0 (L) 03/25/2016   MCV 95.6 03/25/2016   PLT 230 03/25/2016     Chemistry      Component Value Date/Time   NA 132 (L) 04/02/2016 0920   K 4.7 04/02/2016 0920   CL 96 04/02/2016 0920   CO2 29 04/02/2016 0920   BUN 10 04/02/2016 0920   CREATININE 0.63 04/02/2016 0920   CREATININE 0.67 11/02/2015 1156      Component Value Date/Time   CALCIUM 9.9 04/02/2016 0920   ALKPHOS 58 03/25/2016 1645   AST 23 03/25/2016 1645   ALT 19 03/25/2016 1645   BILITOT 1.0 03/25/2016 1645     Lab Results  Component Value Date   CHOL 241 (H) 04/02/2016   HDL 111.30 04/02/2016   LDLCALC 114 (H) 04/02/2016   LDLDIRECT 110.1 11/27/2010   TRIG 78.0 04/02/2016   CHOLHDL 2 04/02/2016    PAST  MEDICAL HISTORY: Past Medical History:  Diagnosis Date  . Anemia   . Osteopenia   . PONV (postoperative nausea and vomiting)   . 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  . Wears hearing aid     PAST SURGICAL HISTORY: Past Surgical History:  Procedure Laterality Date  . ABDOMINAL HYSTERECTOMY  1973   1/2 of ovary left cyst on ovary  . APPENDECTOMY     removed with hysterectomy  . LUMBAR WOUND DEBRIDEMENT  12/06/2011   Procedure: LUMBAR WOUND  DEBRIDEMENT;  Surgeon: Kristeen Miss, MD;  Location: [place] [facility];  Service: Neurosurgery;  Laterality: N/A;  Repair of Pseudomeningocele Lumbar wound  . ROTATOR CUFF REPAIR     left   . shoulder rotator culff   2006  . 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 b complex vitamins capsule Take 1 capsule by mouth daily.    Marland Kitchen CALCIUM PO Take 1,000 Units by mouth.    . Cholecalciferol (VITAMIN D3) 5000 UNITS CAPS Take 1 capsule by mouth daily.    Marland Kitchen MAGNESIUM PO Take 1,200 mg by mouth every evening.    . Meth-Hyo-M Bl-Na Phos-Ph Sal (URO-MP) 118 MG CAPS Take 118 mg by mouth 2 (two) times daily.  3  . Multiple Vitamins-Minerals (OSTEO COMPLEX PO) Take by mouth.    Marland Kitchen RAPAFLO 8 MG CAPS capsule Take 8 mg by mouth every evening.    Marland Kitchen MONUROL 3 g PACK     . pregabalin (LYRICA) 25 MG capsule Take 1 capsule (25 mg total) by mouth 2 (two) times daily. (Patient not taking: Reported on 05/29/2016) 180 capsule 0   No current facility-administered medications on file prior to visit.     ALLERGIES: Allergies  Allergen Reactions  . Ibuprofen-Famotidine Other (See Comments)    Elevated BP  . Penicillins Rash    FAMILY HISTORY: Family History  Problem Relation Age of Onset  . Stroke Mother     2010  . Heart attack Father   . Anesthesia problems Neg Hx     SOCIAL HISTORY: Social History   Social History  . Marital status: Legally Separated    Spouse name: N/A  . Number of children: N/A  . Years of education: N/A   Occupational History  . Not on file.   Social History Main Topics  . Smoking status: Former Smoker    Packs/day: 0.25    Years: 2.00    Types: Cigarettes    Quit date: 09/03/1967  . Smokeless tobacco: Not on file  . Alcohol use 2.4 oz/week    4 Glasses of wine per week  . Drug use: No  . Sexual activity: Not on file   Other Topics Concern  . 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   Born in [place] calls [place] home in [place] since [place]: Constitutional: No fevers, chills, or sweats, no generalized fatigue, change in appetite Eyes: No visual changes, double vision, eye pain Ear, nose and throat: No hearing loss, ear pain, nasal congestion, sore throat Cardiovascular: No chest pain, palpitations Respiratory:  No shortness of breath at rest or with exertion, wheezes GastrointestinaI: No nausea, vomiting, diarrhea, abdominal pain, fecal incontinence Genitourinary:  No dysuria,  urinary retention or frequency Musculoskeletal:  No neck pain, back pain Integumentary: No rash, pruritus, skin lesions Neurological: as above Psychiatric: No depression, insomnia, anxiety Endocrine: No palpitations, fatigue, diaphoresis, mood swings, change in appetite, change in weight, increased thirst Hematologic/Lymphatic:  No anemia, purpura, petechiae. Allergic/Immunologic: no itchy/runny eyes, nasal congestion, recent allergic reactions, rashes  PHYSICAL EXAM: Vitals:   05/29/16 1045  BP: 134/70  Pulse: (!) 59  Temp: 97.9 F (36.6 C)   General: No acute distress Head:  Normocephalic/atraumatic Eyes: Fundoscopic exam shows bilateral sharp discs, no vessel changes, exudates, or hemorrhages Neck: supple, no paraspinal tenderness, full range of motion Back: No paraspinal tenderness Heart: regular rate and rhythm Lungs: Clear to auscultation bilaterally. Vascular: No carotid bruits. Skin/Extremities: No rash, no edema Neurological Exam: Mental status: alert and oriented to person, place, and time, no dysarthria or aphasia, Fund of knowledge is appropriate.  Recent and remote memory are intact. 2/3 delayed recall. Attention and concentration are normal.    Able to name objects and repeat phrases. Cranial nerves: CN I: not tested CN II: pupils equal, round and reactive to light,  visual fields intact, fundi unremarkable. CN III, IV, VI:  full range of motion, no nystagmus, no ptosis CN V: facial sensation intact CN VII: upper and lower face symmetric CN VIII: hearing intact to finger rub CN IX, X: gag intact, uvula midline CN XI: sternocleidomastoid and trapezius muscles intact CN XII: tongue midline Bulk & Tone: normal, no fasciculations. Motor: 5/5 throughout with no pronator drift. Sensation: intact to light touch, cold, pin, vibration and joint position sense.  No extinction to double simultaneous stimulation.  Romberg test negative Deep Tendon Reflexes: +2 throughout, no ankle clonus Plantar responses: downgoing bilaterally Cerebellar: no incoordination on finger to nose, heel to shin. No dysdiadochokinesia Gait: narrow-based and steady, able to tandem walk adequately. Tremor: none  IMPRESSION: This is a pleasant 75 year old right-handed woman presenting after a syncopal episode on 03/03/16, then a transient episode of expressive aphasia last 03/25/16. Her neurological exam today is normal. The transient aphasia occurred in the setting of low sodium, felt to be due to increased free water intake. The syncopal episode may have been related to heat+electrolyte abnormalities, however the episode on 03/25/16 is concerning for a TIA. MRI brain and MRA head without contrast, as well as carotid doppler and echocardiogram will be ordered as part of the stroke/TIA workup. She was advised to start a daily baby aspirin. She is noted to have elevated cholesterol levels, she will discuss goal of LDL <70, control of BP, with her PCP. She has back pain and will continue to follow-up with PT and try taking nortriptyline for back/leg pain. We discussed [place] driving laws that indicate one should not drive after an episode of loss of consciousness until 6 months event-free. She knows to go to the ER if symptoms recur or progress. She will follow-up in 3 months.  Thank you for allowing me to  participate in the care of this patient. Please do not hesitate to call for any questions or concerns.   Ellouise Newer, M.D.  CC: Dr. Regis Bill

## 2016-05-29 NOTE — Patient Instructions (Signed)
1. Schedule MRI brain without contrast and MRA head without contrast 2. Schedule carotid artery dopplers 3. Start enteric coated aspirin 81mg  daily 4. Discuss cholesterol with Dr. Regis Bill 5. Continue Physical therapy and nortriptyline for back and leg 6. As per [place] laws, after an episode of loss of consciousness, one should not drive until 6 months event-free 7. If symptoms recur, go to ER immediately

## 2016-05-31 ENCOUNTER — Encounter: Payer: Self-pay | Admitting: Internal Medicine

## 2016-05-31 ENCOUNTER — Telehealth: Payer: Self-pay | Admitting: Neurology

## 2016-05-31 ENCOUNTER — Ambulatory Visit (INDEPENDENT_AMBULATORY_CARE_PROVIDER_SITE_OTHER): Payer: Medicare Other | Admitting: Internal Medicine

## 2016-05-31 VITALS — BP 132/80 | HR 87 | Temp 97.6°F | Ht 61.0 in | Wt 102.0 lb

## 2016-05-31 DIAGNOSIS — E871 Hypo-osmolality and hyponatremia: Secondary | ICD-10-CM | POA: Diagnosis not present

## 2016-05-31 DIAGNOSIS — R030 Elevated blood-pressure reading, without diagnosis of hypertension: Secondary | ICD-10-CM

## 2016-05-31 DIAGNOSIS — IMO0001 Reserved for inherently not codable concepts without codable children: Secondary | ICD-10-CM

## 2016-05-31 NOTE — Telephone Encounter (Signed)
PT called and said we need to call the hospital to schedule an ultrasound/Dawn CB# [phone]

## 2016-05-31 NOTE — Patient Instructions (Addendum)
Need more data  to be able to tell  If you have true hypertension  .   So get an arm monitor  Sitting  For   5 minutes  Take blood pressure readings twice a day for 7- 10 days and then periodically .To ensure below 140/90   .record  Readings    . Get an arm monitor   Rest for 3-5 minutes and then   Take first reading  After a few minutes  Take second reading as this is the  Accurate one  Plan ROV   In a month WITH YOUR monitor  And readings and decide if other   interventions .     How to Take Your Blood Pressure HOW DO I GET A BLOOD PRESSURE MACHINE?  You can buy an electronic home blood pressure machine at your local pharmacy. Insurance will sometimes cover the cost if you have a prescription.  Ask your doctor what type of machine is best for you. There are different machines for your arm and your wrist.  If you decide to buy a machine to check your blood pressure on your arm, first check the size of your arm so you can buy the right size cuff. To check the size of your arm:   Use a measuring tape that shows both inches and centimeters.   Wrap the measuring tape around the upper-middle part of your arm. You may need someone to help you measure.   Write down your arm measurement in both inches and centimeters.   To measure your blood pressure correctly, it is important to have the right size cuff.   If your arm is up to 13 inches (up to 34 centimeters), get an adult cuff size.  If your arm is 13 to 17 inches (35 to 44 centimeters), get a large adult cuff size.    If your arm is 17 to 20 inches (45 to 52 centimeters), get an adult thigh cuff.  WHAT DO THE NUMBERS MEAN?   There are two numbers that make up your blood pressure. For example: 120/80.  The first number (120 in our example) is called the "systolic pressure." It is a measure of the pressure in your blood vessels when your heart is pumping blood.  The second number (80 in our example) is called the "diastolic  pressure." It is a measure of the pressure in your blood vessels when your heart is resting between beats.  Your doctor will tell you what your blood pressure should be. WHAT SHOULD I DO BEFORE I CHECK MY BLOOD PRESSURE?   Try to rest or relax for at least 30 minutes before you check your blood pressure.  Do not smoke.  Do not have any drinks with caffeine, such as:  Soda.  Coffee.  Tea.  Check your blood pressure in a quiet room.  Sit down and stretch out your arm on a table. Keep your arm at about the level of your heart. Let your arm relax.  Make sure that your legs are not crossed. HOW DO I CHECK MY BLOOD PRESSURE?  Follow the directions that came with your machine.  Make sure you remove any tight-fitting clothing from your arm or wrist. Wrap the cuff around your upper arm or wrist. You should be able to fit a finger between the cuff and your arm. If you cannot fit a finger between the cuff and your arm, it is too tight and should be removed and rewrapped.  Some  units require you to manually pump up the arm cuff.  Automatic units inflate the cuff when you press a button.  Cuff deflation is automatic in both models.  After the cuff is inflated, the unit measures your blood pressure and pulse. The readings are shown on a monitor. Hold still and breathe normally while the cuff is inflated.  Getting a reading takes less than a minute.  Some models store readings in a memory. Some provide a printout of readings. If your machine does not store your readings, keep a written record.  Take readings with you to your next visit with your doctor.   This information is not intended to replace advice given to you by your health care provider. Make sure you discuss any questions you have with your health care provider.   Document Released: 08/01/2008 Document Revised: 09/09/2014 Document Reviewed: 10/14/2013 Elsevier Interactive Patient Education [employer].

## 2016-05-31 NOTE — Progress Notes (Signed)
Chief Complaint  Patient presents with  . Hypertension    pt states that she was seen at urology with readings of 155/80, 139/70, headache and twitching of right eye    HPI: Kaitlyn Flowers 75 y.o. comes in today because of concerns about blood pressure readings that are elevated.   Right  headache tno bad and eye twitchy  recently milds better with exercise  Also And had aquino fu    And to do mri and mra and vascular eval  .  Hyponatremia Sodium  And bp was 130 range    concern about HA and   Was up at rologist   155/ 80   Yesterday  And yesterday afternoon  At the y and was   160/80 and then  139 after walking  Tylenol    Wakes up with  This at time exercise better.    .  On low dose    macrobid   dialy    Go back to cath at times . But no urine infection now .  Never had HT  ROS: See pertinent positives and negatives per HPI.  Past Medical History:  Diagnosis Date  . Anemia   . Osteopenia   . PONV (postoperative nausea and vomiting)   . 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  . Wears hearing aid     Family History  Problem Relation Age of Onset  . Stroke Mother     2010  . Heart attack Father   . Anesthesia problems Neg Hx     Social History   Social History  . Marital status: Legally Separated    Spouse name: N/A  . Number of children: N/A  . Years of education: N/A   Social History Main Topics  . Smoking status: Former Smoker    Packs/day: 0.25    Years: 2.00    Types: Cigarettes    Quit date: 09/03/1967  . Smokeless tobacco: None  . Alcohol use 2.4 oz/week    4 Glasses of wine per week  . Drug use: No  . Sexual activity: Not Asked   Other Topics Concern  . 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 aspirin EC 81 MG tablet Take  1 tablet (81 mg total) by mouth daily. 30 tablet 11  . b complex vitamins capsule Take 1 capsule by mouth daily.    Marland Kitchen CALCIUM PO Take 1,000 Units by mouth.    . Cholecalciferol (VITAMIN D3) 5000 UNITS CAPS Take 1 capsule by mouth daily.    Marland Kitchen MAGNESIUM PO Take 1,200 mg by mouth every evening.    . Meth-Hyo-M Bl-Na Phos-Ph Sal (URO-MP) 118 MG CAPS Take 118 mg by mouth 2 (two) times daily.  3  . Multiple Vitamins-Minerals (OSTEO COMPLEX PO) Take by mouth.    . nitrofurantoin (MACRODANTIN) 100 MG capsule     . nortriptyline (PAMELOR) 25 MG capsule Take 25 mg by mouth at bedtime.  2  . RAPAFLO 8 MG CAPS capsule Take 8 mg by mouth every evening.    Marland Kitchen MONUROL 3 g PACK     . pregabalin (LYRICA) 25 MG capsule Take 1 capsule (25 mg total) by mouth 2 (two) times daily. [place]  capsule 0   No facility-administered medications prior to visit.      EXAM:  BP 132/80   Pulse 87   Temp 97.6 F (36.4 C) (Oral)   Ht 5\' 1"  (1.549 m)   Wt 102 lb (46.3 kg)   SpO2 96%   BMI 19.27 kg/m   Body mass index is 19.27 kg/m.  GENERAL: vitals reviewed and listed above, alert, oriented, appears well hydrated and in no acute distress HEENT: atraumatic, conjunctiva  clear, no obvious abnormalities on inspection of external nose and ears NECK: no obvious masses on inspection palpation  CV: HRRR, no clubbing cyanosis or  peripheral edema nl cap refill  MS: moves all extremities without noticeable focal  abnormality PSYCH: pleasant and cooperative, no obvious depression or anxiety Repeat bp 134/72 right sitting  ASSESSMENT AND PLAN:  Discussed the following assessment and plan:  Elevated BP - confirm readings and  plan fu with monitor in a month or  as needed before then risk bnefot of med  etc proceed with neuro wu as planned  Hyponatremia If rx needed would avoid diuretic  Consider low dose ccb  -Patient advised to return or notify health care team  if symptoms worsen ,persist or new concerns  arise.  Patient Instructions  Need more data  to be able to tell  If you have true hypertension  .   So get an arm monitor  Sitting  For   5 minutes  Take blood pressure readings twice a day for 7- 10 days and then periodically .To ensure below 140/90   .record  Readings    . Get an arm monitor   Rest for 3-5 minutes and then   Take first reading  After a few minutes  Take second reading as this is the  Accurate one  Plan ROV   In a month WITH YOUR monitor  And readings and decide if other   interventions .     How to Take Your Blood Pressure HOW DO I GET A BLOOD PRESSURE MACHINE?  You can buy an electronic home blood pressure machine at your local pharmacy. Insurance will sometimes cover the cost if you have a prescription.  Ask your doctor what type of machine is best for you. There are different machines for your arm and your wrist.  If you decide to buy a machine to check your blood pressure on your arm, first check the size of your arm so you can buy the right size cuff. To check the size of your arm:   Use a measuring tape that shows both inches and centimeters.   Wrap the measuring tape around the upper-middle part of your arm. You may need someone to help you measure.   Write down your arm measurement in both inches and centimeters.   To measure your blood pressure correctly, it is important to have the right size cuff.   If your arm is up to 13 inches (up to 34 centimeters), get an adult cuff size.  If your arm is 13 to 17 inches (35 to 44 centimeters), get a large adult cuff size.    If your arm is 17 to 20 inches (45 to 52 centimeters), get an adult thigh cuff.  WHAT DO THE NUMBERS MEAN?   There are two numbers that make up your blood pressure. For example: 120/80.  The first number (120 in our example) is called the "systolic pressure." It is a measure of the pressure in your  blood vessels when your heart is pumping blood.  The second number (80 in our  example) is called the "diastolic pressure." It is a measure of the pressure in your blood vessels when your heart is resting between beats.  Your doctor will tell you what your blood pressure should be. WHAT SHOULD I DO BEFORE I CHECK MY BLOOD PRESSURE?   Try to rest or relax for at least 30 minutes before you check your blood pressure.  Do not smoke.  Do not have any drinks with caffeine, such as:  Soda.  Coffee.  Tea.  Check your blood pressure in a quiet room.  Sit down and stretch out your arm on a table. Keep your arm at about the level of your heart. Let your arm relax.  Make sure that your legs are not crossed. HOW DO I CHECK MY BLOOD PRESSURE?  Follow the directions that came with your machine.  Make sure you remove any tight-fitting clothing from your arm or wrist. Wrap the cuff around your upper arm or wrist. You should be able to fit a finger between the cuff and your arm. If you cannot fit a finger between the cuff and your arm, it is too tight and should be removed and rewrapped.  Some units require you to manually pump up the arm cuff.  Automatic units inflate the cuff when you press a button.  Cuff deflation is automatic in both models.  After the cuff is inflated, the unit measures your blood pressure and pulse. The readings are shown on a monitor. Hold still and breathe normally while the cuff is inflated.  Getting a reading takes less than a minute.  Some models store readings in a memory. Some provide a printout of readings. If your machine does not store your readings, keep a written record.  Take readings with you to your next visit with your doctor.   This information is not intended to replace advice given to you by your health care provider. Make sure you discuss any questions you have with your health care provider.   Document Released: 08/01/2008 Document Revised: 09/09/2014 Document Reviewed: 10/14/2013 Elsevier Interactive Patient Education  2016 [place] K. Panosh M.D.

## 2016-05-31 NOTE — Telephone Encounter (Signed)
Doppler scheduled. Notified pt of date and time.

## 2016-05-31 NOTE — Progress Notes (Signed)
Pre visit review using our clinic review tool, if applicable. No additional management support is needed unless otherwise documented below in the visit note. 

## 2016-06-04 ENCOUNTER — Ambulatory Visit (HOSPITAL_COMMUNITY)
Admission: RE | Admit: 2016-06-04 | Discharge: 2016-06-04 | Disposition: A | Discharge status: Home / Self Care | Payer: Medicare Other | Source: Ambulatory Visit | Attending: Neurology | Admitting: Neurology

## 2016-06-04 ENCOUNTER — Other Ambulatory Visit: Payer: Self-pay | Admitting: Neurology

## 2016-06-04 DIAGNOSIS — R55 Syncope and collapse: Secondary | ICD-10-CM | POA: Diagnosis not present

## 2016-06-04 DIAGNOSIS — E785 Hyperlipidemia, unspecified: Secondary | ICD-10-CM

## 2016-06-04 DIAGNOSIS — M5416 Radiculopathy, lumbar region: Secondary | ICD-10-CM

## 2016-06-04 DIAGNOSIS — G459 Transient cerebral ischemic attack, unspecified: Secondary | ICD-10-CM

## 2016-06-04 LAB — VAS US CAROTID
LEFT ECA DIAS: -6 cm/s
Left CCA dist dias: -16 cm/s
Left CCA dist sys: -67 cm/s
Left CCA prox dias: 17 cm/s
Left CCA prox sys: 79 cm/s
Left ICA dist dias: -15 cm/s
Left ICA dist sys: -66 cm/s
Left ICA prox dias: -19 cm/s
Left ICA prox sys: -68 cm/s
RIGHT ECA DIAS: -5 cm/s
RIGHT VERTEBRAL DIAS: -14 cm/s
Right CCA prox dias: 11 cm/s
Right CCA prox sys: 85 cm/s
Right cca dist sys: -71 cm/s

## 2016-06-04 NOTE — Progress Notes (Signed)
.  VASCULAR LAB PRELIMINARY  PRELIMINARY  PRELIMINARY  PRELIMINARY  Carotid duplex completed.    Preliminary report:  Bilateral - No evidence of ICA stenosis. Vertebral artery flow is antegrade.  Alisan Dokes, RVS 06/04/2016, 11:16 AM

## 2016-06-04 NOTE — Progress Notes (Signed)
VASCULAR LAB PRELIMINARY  PRELIMINARY  PRELIMINARY  PRELIMINARY  Carotid duplex completed.    Preliminary report:  Bilateral - No evidence of ICA stenosis. Vertebral artery flow is antegrade.  Keiarra Charon, RVS 06/04/2016, 10:59 AM

## 2016-06-08 ENCOUNTER — Ambulatory Visit
Admission: RE | Admit: 2016-06-08 | Discharge: 2016-06-08 | Disposition: A | Discharge status: Home / Self Care | Payer: Medicare Other | Source: Ambulatory Visit | Attending: Neurology | Admitting: Neurology

## 2016-06-08 DIAGNOSIS — G459 Transient cerebral ischemic attack, unspecified: Secondary | ICD-10-CM

## 2016-06-08 DIAGNOSIS — R55 Syncope and collapse: Secondary | ICD-10-CM

## 2016-06-08 DIAGNOSIS — M5416 Radiculopathy, lumbar region: Secondary | ICD-10-CM

## 2016-06-08 DIAGNOSIS — E785 Hyperlipidemia, unspecified: Secondary | ICD-10-CM

## 2016-06-09 ENCOUNTER — Encounter: Payer: Self-pay | Admitting: Neurology

## 2016-06-21 ENCOUNTER — Encounter: Payer: Self-pay | Admitting: Internal Medicine

## 2016-06-21 ENCOUNTER — Ambulatory Visit (INDEPENDENT_AMBULATORY_CARE_PROVIDER_SITE_OTHER): Payer: Medicare Other | Admitting: Internal Medicine

## 2016-06-21 VITALS — BP 138/72 | HR 101 | Temp 98.1°F | Wt 102.8 lb

## 2016-06-21 DIAGNOSIS — Z23 Encounter for immunization: Secondary | ICD-10-CM | POA: Diagnosis not present

## 2016-06-21 DIAGNOSIS — E871 Hypo-osmolality and hyponatremia: Secondary | ICD-10-CM

## 2016-06-21 DIAGNOSIS — R519 Headache, unspecified: Secondary | ICD-10-CM

## 2016-06-21 DIAGNOSIS — R030 Elevated blood-pressure reading, without diagnosis of hypertension: Secondary | ICD-10-CM

## 2016-06-21 DIAGNOSIS — R51 Headache: Secondary | ICD-10-CM | POA: Diagnosis not present

## 2016-06-21 LAB — BASIC METABOLIC PANEL
BUN: 16 mg/dL (ref 6–23)
CO2: 31 mEq/L (ref 19–32)
Calcium: 10.1 mg/dL (ref 8.4–10.5)
Chloride: 92 mEq/L — ABNORMAL LOW (ref 96–112)
Creatinine, Ser: 0.67 mg/dL (ref 0.40–1.20)
GFR: 91.4 mL/min (ref >60.00)
Glucose, Bld: 102 mg/dL — ABNORMAL HIGH (ref 70–99)
Potassium: 4.6 mEq/L (ref 3.5–5.1)
Sodium: 130 mEq/L — ABNORMAL LOW (ref 135–145)

## 2016-06-21 MED ORDER — LISINOPRIL 5 MG PO TABS: 5.0000 mg | ORAL_TABLET | Freq: Every day | ORAL | 3 refills | Status: DC

## 2016-06-21 NOTE — Progress Notes (Signed)
Chief Complaint  Patient presents with  . blood pressure issues    HPI: Kaitlyn Flowers 75 y.o.   sda  For concern about BP  See past hx  She brings in her monitor today which correlates well with our readings. She is getting mostly 140-148 with an occasional 1:30 or lower. Not sure what to do with this. However this past week she's been having more nocturnal headaches concern if her sodium level goes down some. She's had a neurologic workup with no focal lesions. She has a follow-up appointment with endocrinology Dr. Darnell Level next week about her low sodium. ROS: See pertinent positives and negatives per HPI.  Past Medical History:  Diagnosis Date  . Anemia   . Osteopenia   . PONV (postoperative nausea and vomiting)   . 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  . Wears hearing aid     Family History  Problem Relation Age of Onset  . Stroke Mother     2010  . Heart attack Father   . Anesthesia problems Neg Hx     Social History   Social History  . Marital status: Legally Separated    Spouse name: N/A  . Number of children: N/A  . Years of education: N/A   Social History Main Topics  . Smoking status: Former Smoker    Packs/day: 0.25    Years: 2.00    Types: Cigarettes    Quit date: 09/03/1967  . Smokeless tobacco: Never Used  . Alcohol use 2.4 oz/week    4 Glasses of wine per week  . Drug use: No  . Sexual activity: Not Asked   Other Topics Concern  . 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 aspirin EC 81 MG tablet Take 1 tablet (81 mg total) by mouth daily. 30 tablet 11  . b complex vitamins capsule Take 1 capsule by mouth daily.    Marland Kitchen CALCIUM PO Take 1,000 Units by mouth.    . Cholecalciferol (VITAMIN D3) 5000 UNITS CAPS Take 1 capsule by mouth daily.      Marland Kitchen MAGNESIUM PO Take 1,200 mg by mouth every evening.    . Meth-Hyo-M Bl-Na Phos-Ph Sal (URO-MP) 118 MG CAPS Take 118 mg by mouth 2 (two) times daily.  3  . Multiple Vitamins-Minerals (OSTEO COMPLEX PO) Take by mouth.    . nitrofurantoin (MACRODANTIN) 100 MG capsule     . nortriptyline (PAMELOR) 25 MG capsule Take 25 mg by mouth at bedtime.  2  . RAPAFLO 8 MG CAPS capsule Take 8 mg by mouth every evening.     No facility-administered medications prior to visit.      EXAM:  BP 138/72 (BP Location: Right Arm, Patient Position: Sitting, Cuff Size: Normal)   Pulse (!) 101   Temp 98.1 F (36.7 C) (Oral)   Wt 102 lb 12.8 oz (46.6 kg)   SpO2 95%   BMI 19.42 kg/m   Body mass index is 19.42 kg/m.  GENERAL: vitals reviewed and listed above, alert, oriented, appears well hydrated and in no acute distress HEENT: atraumatic, conjunctiva  clear, no obvious abnormalities on inspection of external nose and ears NECK: no obvious masses on inspection palpation  LUNGS: clear to auscultation  bilaterally, no wheezes, rales or rhonchi, good air movement CV: HRRR, no clubbing cyanosis or  peripheral edema nl cap refill  MS: moves all extremities without noticeable focal  abnormality PSYCH: pleasant and cooperative, cognition seem intact  BP Readings from Last 3 Encounters:  06/21/16 138/72  05/31/16 132/80  05/29/16 134/70   Wt Readings from Last 3 Encounters:  06/21/16 102 lb 12.8 oz (46.6 kg)  05/31/16 102 lb (46.3 kg)  06/08/16 102 lb (46.3 kg)    ASSESSMENT AND PLAN:  Discussed the following assessment and plan:  Elevated blood pressure reading - not at goal monitor adequate for decision process can begin low dose acei but can wait to see dr Renne Crigler on monday - Plan: Basic metabolic panel  Nocturnal headaches - no structural acute problems micro vasc changes on mri non acute  Hyponatremia - recheck today - Plan: Basic metabolic panel  Encounter for immunization - Plan: Flu vaccine  HIGH DOSE PF  Need for prophylactic vaccination against Streptococcus pneumoniae (pneumococcus) - Plan: Pneumococcal conjugate vaccine 13-valent  -Patient advised to return or notify health care team  if symptoms worsen ,persist or new concerns arise.  Patient Instructions  Checking sodium today   bp seems slightly up at home in the 140 - 148 range  We can add low dose antihypertensive  For now  And  monitor  Your monitor is good enough to decide on medication adjustments .   Send in readings my chart in  6 weeks about  bp med and readings . Then fu from there.   Flu vaccine and prevnar 13 today  [place]. Khloey Chern M.D.

## 2016-06-21 NOTE — Progress Notes (Signed)
Pre visit review using our clinic review tool, if applicable. No additional management support is needed unless otherwise documented below in the visit note. 

## 2016-06-21 NOTE — Patient Instructions (Addendum)
Checking sodium today   bp seems slightly up at home in the 140 - 148 range  We can add low dose antihypertensive  For now  And  monitor  Your monitor is good enough to decide on medication adjustments .   Send in readings my chart in  6 weeks about  bp med and readings . Then fu from there.   Flu vaccine and prevnar 13 today  [place]

## 2016-06-24 ENCOUNTER — Ambulatory Visit (INDEPENDENT_AMBULATORY_CARE_PROVIDER_SITE_OTHER): Payer: Medicare Other | Admitting: Internal Medicine

## 2016-06-24 ENCOUNTER — Encounter: Payer: Self-pay | Admitting: Internal Medicine

## 2016-06-24 VITALS — BP 140/80 | HR 85 | Wt 102.0 lb

## 2016-06-24 DIAGNOSIS — M810 Age-related osteoporosis without current pathological fracture: Secondary | ICD-10-CM

## 2016-06-24 DIAGNOSIS — E871 Hypo-osmolality and hyponatremia: Secondary | ICD-10-CM | POA: Diagnosis not present

## 2016-06-24 NOTE — Progress Notes (Signed)
Patient ID: Kaitlyn Flowers, female   DOB: [birth year], 75 y.o.   MRN: [id number]   HPI  Kaitlyn Flowers is a 75 y.o.-year-old female, returning for f/u for of osteoporosis and hyponatremia. Last visit 3 mo ago.  OP: Reviewed and addended hx: Pt was dx with OP in 2015. She denies any falls but broke her R ankle roller skating.   No dizziness/vertigo/orthostasis.  I reviewed pt's DEXA scans: 01/08/2016 Lumbar spine (L1-L4) Femoral neck (FN)  T-score - 1.9  RFN: - 2.5 LFN: - 2.7  Change in BMD from previous DXA test (%) - 1.9%  + 0.4%   (*) statistically significant  Prev: Date L1-L4 T score FN T score 33% distal Radius  12/10/2013 -1.7 RFN: -2.7 LFN: -2.5 n/a   L1-L4 decreased by 5.8% from 2012 Total mean hip BMD decreased 7.7%* from 2012    I suggested Fosamax 70 mg weekly at last visit >> refused in 03/2016.  No h/o hyper/hypocalcemia. No h/o kidney stones. Lab Results  Component Value Date   PTH 59 02/13/2016   PTH 66 (H) 07/11/2014   CALCIUM 10.1 06/21/2016   CALCIUM 9.9 04/02/2016   CALCIUM 9.6 03/27/2016   CALCIUM 9.6 03/25/2016   CALCIUM 9.8 02/13/2016   CALCIUM 9.3 02/13/2016   CALCIUM 10.1 11/02/2015   CALCIUM 10.0 07/11/2014   CALCIUM 9.8 07/11/2014   CALCIUM 9.7 02/10/2014   No h/o thyrotoxicosis. Reviewed TSH recent levels:  Lab Results  Component Value Date   TSH 1.28 02/13/2016   TSH 1.21 07/11/2014   TSH 1.11 11/11/2011   TSH 1.12 11/27/2010   + h/o vitamin D deficiency.  She was taking 5000 IU vit D daily >> stopped after last check.  Last vitamin D: Lab Results  Component Value Date   VD25OH 94.31 04/02/2016   VD25OH 60.65 02/10/2014    She does take calcium supplements: calcium citrate. She eats dairy and green, leafy, vegetables.   No h/o CKD. Last BUN/Cr: Lab Results  Component Value Date   BUN 16 06/21/2016   CREATININE 0.67 06/21/2016   + weight bearing exercises: walks, Pilates.  She does not take high vitamin A doses.    Pt does have a FH of osteoporosis in mother/sisters. + fractures: kyphosis in mother.   Menopause was at 26 - surgical TAH + BSO  Hyponatremia:  Pt had orthostatic syncope 03/03/2016 >> ED. She then returned to ED with diffic. Speaking, mild confusion and blurry vision on 03/25/2016 >> CT head neg. for stroke >> assumed to be a TIA or neuro changes from UTI. Na was 123 then.  She started to have UTIs beginning of 2017 (pseudomonas), had PICC line >> then oral ABx. She had several recurrences.  She had a urinary catheter - which she was using 3x a day as it was suspected that she could not empty the bladder well. The frequency was then reduced to every other day, but she developed a new UTI, and now she is again cathing every day.  She started to drink more water in last month as she developed dry mouth. She was also trying to increase water intake with her UTIs.  At last visit, I and Os: - ~68 oz in (~2L) - ~65 oz out (~1.9L)  However, she tells me she started to drink more water since last visit as her urine was more concentrated and she was afraid she may develop a UTI again.  She started 1.5 week ago to get occasional HAs in the  middle of the night. These are sharp, in vertex, associated with R eye blurriness.  No vertigo, dizziness.   MRI/MRA on 06/08/2016 did not show a pituitary mass. It showed chronic microvascular ds.  Reviewed Na levels - latest Na lower: Lab Results  Component Value Date   NA 130 (L) 06/21/2016   NA 132 (L) 04/02/2016   NA 126 (L) 03/27/2016   NA 124 (L) 03/25/2016   NA 123 (L) 03/25/2016   NA 135 02/13/2016   NA 135 11/02/2015   NA 135 07/11/2014   NA 132 (L) 02/10/2014   NA 139 08/24/2012   NA 138 08/22/2012   NA 140 11/11/2011   NA 139 11/27/2010   NA 140 06/09/2007   NA 141 04/09/2007     Labs at last visit >> Na improving: Component     Latest Ref Rng & Units 04/02/2016          Sodium     135 - 145 mEq/L 132 (L)  Potassium     3.5  - 5.1 mEq/L 4.7  Chloride     96 - 112 mEq/L 96  CO2     19 - 32 mEq/L 29  Glucose     70 - 99 mg/dL 90  BUN     6 - 23 mg/dL 10  Creatinine     0.40 - 1.20 mg/dL 0.63  Calcium     8.4 - 10.5 mg/dL 9.9  GFR     >60.00 mL/min 98.19  Cholesterol     0 - 200 mg/dL 241 (H)  Triglycerides     0.0 - 149.0 mg/dL 78.0  HDL Cholesterol     >39.00 mg/dL 111.30  VLDL     0.0 - 40.0 mg/dL 15.6  LDL (calc)     0 - 99 mg/dL 114 (H)  Total CHOL/HDL Ratio      2  NonHDL      129.47  VITD     30.00 - 100.00 ng/mL 94.31  Osmolality     278 - 305 mOsm/kg 271 (L)  Cortisol, Plasma     ug/dL 10.2  C206 ACTH     6 - 50 pg/mL 16  Uric Acid, Serum     2.4 - 7.0 mg/dL 2.7  Osmolality, Urine     50 - 1,200 mOsm/kg 304  Potassium Urine     12 - 129 mmol/L 28  Sodium, Urine     28 - 272 mmol/L 77  Labs pointed towards improving SIADH/water O/L without appropriate renal water wasting. No adrenal insufficiency. No hypothyroidism. No HTG.  ROS: Constitutional: no weight gain/loss, no fatigue, no subjective hyperthermia/hypothermia, no nocturia  Eyes: + blurry vision, no xerophthalmia ENT: no sore throat, no nodules palpated in throat, no dysphagia/odynophagia, no hoarseness, + decreased hearing Cardiovascular: no CP/SOB/palpitations/leg swelling Respiratory: no cough/SOB Gastrointestinal: no N/V/D/C Musculoskeletal: no muscle/joint aches  Skin: no rashes Neurological: no tremors/numbness/tingling/dizziness, + HA  I reviewed pt's medications, allergies, PMH, social hx, family hx, and changes were documented in the history of present illness. Otherwise, unchanged from my initial visit note.  Past Medical History:  Diagnosis Date  . Anemia   . Osteopenia   . PONV (postoperative nausea and vomiting)   . 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  . Wears hearing aid    Past Surgical History:  Procedure Laterality Date  . ABDOMINAL HYSTERECTOMY  1973   1/2 of  ovary left cyst on  ovary  . APPENDECTOMY     removed with hysterectomy  . LUMBAR WOUND DEBRIDEMENT  12/06/2011   Procedure: LUMBAR WOUND DEBRIDEMENT;  Surgeon: Kristeen Miss, MD;  Location: [facility];  Service: Neurosurgery;  Laterality: N/A;  Repair of Pseudomeningocele Lumbar wound  . ROTATOR CUFF REPAIR     left   . shoulder rotator culff   2006  . spinal cord tumor  1987   2012 october    History   Social History  . Marital Status: married    Spouse Name: N/A    Number of Children: 2   Occupational History  . consultant   Social History Main Topics  . Smoking status: Former Smoker -- 0.25 packs/day for 2 years    Types: Cigarettes    Quit date: 09/03/1967  . Smokeless tobacco: Not on file  . Alcohol Use: 2.4 oz/week    4-5 Glasses of wine per week  . 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 aspirin EC 81 MG tablet Take 1 tablet (81 mg total) by mouth daily. 30 tablet 11  . b complex vitamins capsule Take 1 capsule by mouth daily.    Marland Kitchen CALCIUM PO Take 1,000 Units by mouth.    . Cholecalciferol (VITAMIN D3) 5000 UNITS CAPS Take 1 capsule by mouth daily.    Marland Kitchen LYRICA 25 MG capsule     . MAGNESIUM PO Take 1,200 mg by mouth every evening.    . Meth-Hyo-M Bl-Na Phos-Ph Sal (URO-MP) 118 MG CAPS Take 118 mg by mouth 2 (two) times daily.  3  . Multiple Vitamins-Minerals (OSTEO COMPLEX PO) Take by mouth.    . nitrofurantoin (MACRODANTIN) 100 MG capsule     . nortriptyline (PAMELOR) 25 MG capsule Take 25 mg by mouth at bedtime.  2  . RAPAFLO 8 MG CAPS capsule Take 8 mg by mouth every evening.    Marland Kitchen lisinopril (PRINIVIL,ZESTRIL) 5 MG tablet Take 1 tablet (5 mg total) by mouth daily. (Patient not taking: Reported on 06/24/2016) 30 tablet 3   No current  facility-administered medications on file prior to visit.    Allergies  Allergen Reactions  . Ibuprofen-Famotidine Other (See Comments)    Elevated BP  . Penicillins Rash   Family History  Problem Relation Age of Onset  . Stroke Mother     2010  . Heart attack Father   . Anesthesia problems Neg Hx    PE: BP 140/80 (BP Location: Left Arm, Patient Position: Sitting)   Pulse 85   Wt 102 lb (46.3 kg)   SpO2 99%   BMI 19.27 kg/m  Wt Readings from Last 3 Encounters:  06/24/16 102 lb (46.3 kg)  06/21/16 102 lb 12.8 oz (46.6 kg)  05/31/16 102 lb (46.3 kg)   Constitutional: normal weight, in NAD. No kyphosis. Eyes: PERRLA, EOMI, no exophthalmos ENT: moist mucous membranes, no thyromegaly, no cervical lymphadenopathy Cardiovascular: RRR, No MRG Respiratory: CTA B Gastrointestinal: abdomen soft, NT, ND, BS+ Musculoskeletal: no deformities, strength intact in all 4 Skin: moist, warm, no rashes Neurological: no tremor with outstretched hands, DTR normal in all 4  Assessment: 1. Osteoporosis  2. Hyponatremia  Plan: 1. Osteoporosis - likely postmenopausal  - again discussed about increased risk of  fracture, depending on the T score, greatly increased when the T score is lower than -2.5.  - We reviewed her last 2 DEXA scan reports together, and I explained that the T scores appear stable - she would really want to avoid osteoporosis medication and she again inquired about strontium ranelate - she tried to obtain it from [place], but not available there anymore. Per my online research, this is now only available in [place] in [place], and only with prescription. - we reviewed her dietary and supplemental calcium and vitamin D intake - we will check her vitamin D with next lab draw, in 2 weeks. She stopped vitamin D completely after last level was in the 90s. I believe that she may need to be on a lower dose, but will decide after we obtain the new level. - no falls since last visit,  she increased her exercise which is now focused more on weight bearing exercises - will see pt back in a year and we will check another DEXA scan then  2. Hyponatremia - reviewed patient's chart: She developed hyponatremia abruptly in 03/2016. She presented initially with orthostatic syncope, and then with difficulty speaking/mild confusion/blurry vision in the setting of UTI. Her head CT was normal and her symptoms resolved. At that time, sodium was 123, which was new to her. Subsequent sodium levels have been improving, with a sodium of 132 at last visit in 03/2016. At that time, she was trying to keep her fluid intake under 2 L a day. Since then, she started to drink more water, since she felt that she had more dry mouth and her urine was more concentrated. A recent sodium level obtained by PCP this month is lower, at 130. - I reviewed the lab work from last visit and this point towards either SIADH or increase water intake that overcomes renal mechanisms of urine dilution. Therefore, we have 2 options at this point: To either try to decrease her fluid intake again to 1.5 L a day or to at sodium tablets. I would like to avoid sodium tablet since she also had some issues with blood pressure lately. - for now, I advised the patient to reduce her water intake to less than 1.5 L a day >> she agrees.  - I also advised her to use Biotene spray for dry mouth and to use a humidifier in her bedroom  - We will plan to recheck her sodium in 2 weeks from now   Philemon Kingdom, MD PhD [facility] Endocrinology

## 2016-06-24 NOTE — Patient Instructions (Signed)
Please start a Biotin mouth spray.  Use a humidifier.  Try to stay under 1.5 L (50 oz) of fluid daily.  Come back for labs in 2 weeks.  Please return in 1 year.

## 2016-07-08 ENCOUNTER — Telehealth: Payer: Self-pay | Admitting: Internal Medicine

## 2016-07-08 ENCOUNTER — Encounter: Payer: Self-pay | Admitting: Internal Medicine

## 2016-07-08 ENCOUNTER — Ambulatory Visit (INDEPENDENT_AMBULATORY_CARE_PROVIDER_SITE_OTHER): Payer: Medicare Other | Admitting: Internal Medicine

## 2016-07-08 ENCOUNTER — Other Ambulatory Visit: Payer: Medicare Other

## 2016-07-08 VITALS — BP 128/68 | Temp 97.6°F | Ht 61.0 in | Wt 104.6 lb

## 2016-07-08 DIAGNOSIS — R51 Headache: Secondary | ICD-10-CM

## 2016-07-08 DIAGNOSIS — R030 Elevated blood-pressure reading, without diagnosis of hypertension: Secondary | ICD-10-CM | POA: Diagnosis not present

## 2016-07-08 DIAGNOSIS — F439 Reaction to severe stress, unspecified: Secondary | ICD-10-CM | POA: Diagnosis not present

## 2016-07-08 DIAGNOSIS — R519 Headache, unspecified: Secondary | ICD-10-CM

## 2016-07-08 NOTE — Patient Instructions (Addendum)
Certainly stress can cause  Sx and bp elevation but    HBP Usually not causing headaches .  ( Except for sleep apnea) I want you to see the neurologist  About the headaches.  Dr Delice Lesch . Or other.  I would treat  BP  if 150 range most days   As this is risk over time.  Let us know if bp is up.  Stress reduction as you are doing .    I would like to follow up in 2 months  fro coordination of care.

## 2016-07-08 NOTE — Progress Notes (Signed)
Pre visit review using our clinic review tool, if applicable. No additional management support is needed unless otherwise documented below in the visit note.  Chief Complaint  Patient presents with  . High Blood Pressure Readings at Home    Pt reports BP readings are all over the place.  High and low readings.  . Headache    HPI: Kaitlyn Flowers 75 y.o.   sda appt concern about bp and has still   Seen 10 20 dadvised  low dose acei  But didnt take it cause thinks the bp is from stress   bp still swinging   random headaches   130 and 150  At times . Sometimes 140  hwoever is concern about headaches she is getting withoput assoc sx some at night    Stress . recenetly comes to light has  Son with borderline personality disorder ., and now   And work .  ROS: See pertinent positives and negatives per HPI.  Past Medical History:  Diagnosis Date  . Anemia   . Osteopenia   . PONV (postoperative nausea and vomiting)   . 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  . Wears hearing aid     Family History  Problem Relation Age of Onset  . Stroke Mother     2010  . Heart attack Father   . Anesthesia problems Neg Hx     Social History   Social History  . Marital status: Legally Separated    Spouse name: N/A  . Number of children: N/A  . Years of education: N/A   Social History Main Topics  . Smoking status: Former Smoker    Packs/day: 0.25    Years: 2.00    Types: Cigarettes    Quit date: 09/03/1967  . Smokeless tobacco: Never Used  . Alcohol use 2.4 oz/week    4 Glasses of wine per week  . Drug use: No  . Sexual activity: Not Asked   Other Topics Concern  . 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 aspirin EC 81 MG tablet Take 1 tablet (81 mg total) by mouth  daily. 30 tablet 11  . b complex vitamins capsule Take 1 capsule by mouth daily.    Marland Kitchen CALCIUM PO Take 1,000 Units by mouth.    . Cholecalciferol (VITAMIN D3) 5000 UNITS CAPS Take 1 capsule by mouth daily.    Marland Kitchen LYRICA 25 MG capsule     . MAGNESIUM PO Take 1,200 mg by mouth every evening.    . Meth-Hyo-M Bl-Na Phos-Ph Sal (URO-MP) 118 MG CAPS Take 118 mg by mouth 2 (two) times daily.  3  . Multiple Vitamins-Minerals (OSTEO COMPLEX PO) Take by mouth.    . nitrofurantoin (MACRODANTIN) 100 MG capsule     . nortriptyline (PAMELOR) 25 MG capsule Take 25 mg by mouth at bedtime.  2  . RAPAFLO 8 MG CAPS capsule Take 8 mg by mouth every evening.    Marland Kitchen lisinopril (PRINIVIL,ZESTRIL) 5 MG tablet Take 1 tablet (5 mg total) by mouth daily. (Patient not taking: Reported on 07/08/2016) 30 tablet 3   No facility-administered medications prior to visit.      EXAM:  BP 128/68 (BP Location: Right Arm, Cuff Size:  Normal)   Temp 97.6 F (36.4 C) (Oral)   Ht 5\' 1"  (1.549 m)   Wt 104 lb 9.6 oz (47.4 kg)   BMI 19.76 kg/m   Body mass index is 19.76 kg/m. Repeat bp 138/76 GENERAL: vitals reviewed and listed above, alert, oriented, appears well hydrated and in no acute distress HEENT: atraumatic, conjunctiva  clear, no obvious abnormalities on inspection of external nose and ears NECK: no obvious masses on inspection palpation  CV: HRRR, no clubbing cyanosis or  peripheral edema nl cap refill  MS: moves all extremities without noticeable focal  abnormality PSYCH: pleasant and cooperative, no obvious depression or anxiety BP Readings from Last 3 Encounters:  07/08/16 128/68  06/24/16 140/80  06/21/16 138/72   Wt Readings from Last 3 Encounters:  07/08/16 104 lb 9.6 oz (47.4 kg)  06/24/16 102 lb (46.3 kg)  06/21/16 102 lb 12.8 oz (46.6 kg)   revewed reading s on machine   ASSESSMENT AND PLAN:  Discussed the following assessment and plan:  Nocturnal headaches  Elevated blood pressure  reading  Stress Get ldl below 70?    bp goal  Below 140 or therabouts   I believe that her headaches are more problematic and her blood pressure. Did encourage you to take the medicine if her blood pressure is indeed in the 140s 150s range although that may have no effect on her headaches. We'll send a note to her neurologist to see if they can see her early in regard to the headaches. Don't want her to monitor her blood pressure and should take the antihypertensive there is any question aboutcontrol reassured her she could go off and on meds -Patient advised to return or notify health care team  if symptoms worsen ,persist or new concerns arise.  Patient Instructions  Certainly stress can cause  Sx and bp elevation but    HBP Usually not causing headaches .  ( Except for sleep apnea) I want you to see the neurologist  About the headaches.  Dr Delice Lesch . Or other.  I would treat  BP  if 150 range most days   As this is risk over time.  Let us know if bp is up.  Stress reduction as you are doing .    I would like to follow up in 2 months  fro coordination of care.       Standley Brooking. Sabrie Moritz M.D.

## 2016-07-08 NOTE — Telephone Encounter (Signed)
Patient patient had an appointment 11/30 to see Kaitlyn Flowers it was cancelled. She wanted the lab appt cancelled instead. And she stated she really nee to see her.please advise.

## 2016-07-08 NOTE — Telephone Encounter (Signed)
Let's put her down for December 12 at 8:15 in place of a new patient.

## 2016-07-09 ENCOUNTER — Encounter (HOSPITAL_COMMUNITY): Payer: Self-pay | Admitting: Emergency Medicine

## 2016-07-09 ENCOUNTER — Encounter: Payer: Self-pay | Admitting: Internal Medicine

## 2016-07-09 ENCOUNTER — Emergency Department (HOSPITAL_COMMUNITY)
Admission: EM | Admit: 2016-07-09 | Discharge: 2016-07-10 | Disposition: A | Discharge status: Home / Self Care | Payer: Medicare Other | Attending: Emergency Medicine | Admitting: Emergency Medicine

## 2016-07-09 ENCOUNTER — Ambulatory Visit (INDEPENDENT_AMBULATORY_CARE_PROVIDER_SITE_OTHER): Payer: Medicare Other | Admitting: Internal Medicine

## 2016-07-09 ENCOUNTER — Telehealth: Payer: Self-pay | Admitting: Internal Medicine

## 2016-07-09 VITALS — BP 146/76 | Temp 97.5°F | Ht 61.0 in | Wt 104.1 lb

## 2016-07-09 DIAGNOSIS — Z7982 Long term (current) use of aspirin: Secondary | ICD-10-CM | POA: Insufficient documentation

## 2016-07-09 DIAGNOSIS — R2990 Unspecified symptoms and signs involving the nervous system: Secondary | ICD-10-CM | POA: Diagnosis not present

## 2016-07-09 DIAGNOSIS — G4489 Other headache syndrome: Secondary | ICD-10-CM | POA: Diagnosis not present

## 2016-07-09 DIAGNOSIS — M810 Age-related osteoporosis without current pathological fracture: Secondary | ICD-10-CM | POA: Diagnosis not present

## 2016-07-09 DIAGNOSIS — I10 Essential (primary) hypertension: Secondary | ICD-10-CM | POA: Diagnosis not present

## 2016-07-09 DIAGNOSIS — R682 Dry mouth, unspecified: Secondary | ICD-10-CM

## 2016-07-09 DIAGNOSIS — Z87891 Personal history of nicotine dependence: Secondary | ICD-10-CM | POA: Insufficient documentation

## 2016-07-09 DIAGNOSIS — R4182 Altered mental status, unspecified: Secondary | ICD-10-CM | POA: Diagnosis present

## 2016-07-09 DIAGNOSIS — E871 Hypo-osmolality and hyponatremia: Secondary | ICD-10-CM

## 2016-07-09 DIAGNOSIS — Z79899 Other long term (current) drug therapy: Secondary | ICD-10-CM | POA: Diagnosis not present

## 2016-07-09 DIAGNOSIS — R482 Apraxia: Secondary | ICD-10-CM

## 2016-07-09 LAB — CBC WITH DIFFERENTIAL/PLATELET
Basophils Absolute: 0 10*3/uL (ref 0.0–0.1)
Basophils Relative: 0.5 % (ref 0.0–3.0)
Eosinophils Absolute: 0.1 10*3/uL (ref 0.0–0.7)
Eosinophils Relative: 1.1 % (ref 0.0–5.0)
HCT: 32.6 % — ABNORMAL LOW (ref 36.0–46.0)
Hemoglobin: 11 g/dL — ABNORMAL LOW (ref 12.0–15.0)
Lymphocytes Relative: 30.2 % (ref 12.0–46.0)
Lymphs Abs: 1.8 10*3/uL (ref 0.7–4.0)
MCHC: 33.7 g/dL (ref 30.0–36.0)
MCV: 97.7 fl (ref 78.0–100.0)
Monocytes Absolute: 0.5 10*3/uL (ref 0.1–1.0)
Monocytes Relative: 7.9 % (ref 3.0–12.0)
Neutro Abs: 3.5 10*3/uL (ref 1.4–7.7)
Neutrophils Relative %: 60.3 % (ref 43.0–77.0)
Platelets: 277 10*3/uL (ref 150.0–400.0)
RBC: 3.34 Mil/uL — ABNORMAL LOW (ref 3.87–5.11)
RDW: 14 % (ref 11.5–15.5)
WBC: 5.8 10*3/uL (ref 4.0–10.5)

## 2016-07-09 LAB — VITAMIN D 25 HYDROXY (VIT D DEFICIENCY, FRACTURES): VITD: 55.43 ng/mL (ref 30.00–100.00)

## 2016-07-09 LAB — BASIC METABOLIC PANEL
BUN: 14 mg/dL (ref 6–23)
CO2: 29 mEq/L (ref 19–32)
Calcium: 9.8 mg/dL (ref 8.4–10.5)
Chloride: 95 mEq/L — ABNORMAL LOW (ref 96–112)
Creatinine, Ser: 0.68 mg/dL (ref 0.40–1.20)
GFR: 89.84 mL/min (ref >60.00)
Glucose, Bld: 85 mg/dL (ref 70–99)
Potassium: 4.7 mEq/L (ref 3.5–5.1)
Sodium: 132 mEq/L — ABNORMAL LOW (ref 135–145)

## 2016-07-09 NOTE — Telephone Encounter (Signed)
Pt seen in [place] on 07/09/16 by [facility].

## 2016-07-09 NOTE — Patient Instructions (Addendum)
I think you should be seen in the ed for new episode of confusion and speech if not gone soon    Further emergent evaluation.    I cannot explain the sx .   ? If stress or more important factors   See your neurology as planned tomorrow .

## 2016-07-09 NOTE — Telephone Encounter (Signed)
[facility] - Client Early Call Center  Patient Name: NOHELANI PECINA  DOB: [birth year]    Initial Comment Pt states that she can not read. Caller states that she is going to go get the patient now. Can not understand what she is reading   Nurse Assessment  Nurse: Wynetta Emery, RN, Baker Janus Date/Time Eilene Ghazi Time): 07/09/2016 8:54:31 AM  Confirm and document reason for call. If symptomatic, describe symptoms. You must click the next button to save text entered. ---Cleda Clarks is having confusion and is not having visual disturbances can't read and not comprehending  Has the patient traveled out of the country within the last 30 days? ---No  Does the patient have any new or worsening symptoms? ---Yes  Will a triage be completed? ---Yes  Related visit to physician within the last 2 weeks? ---No  Does the PT have any chronic conditions? (i.e. diabetes, asthma, etc.) ---No  Is this a behavioral health or substance abuse call? ---No     Guidelines    Guideline Title Affirmed Question Affirmed Notes       Final Disposition User        Comments  Nurse can not triage Crissy since she is not with friend, Ann Held she called Environmental education officer and said she is working at [employer] and can't see and is somewhat confused can't read come get her Tomi Bamberger states this happened to her before and her sodium was too low in office yesterday could not do labs on her appt given d/t c/o of visual disturbances; 11 7 2017 945 with Dr. Regis Bill at [place] location

## 2016-07-09 NOTE — Telephone Encounter (Signed)
Left a message for a return call.  WP would like more information and possibly for the pt to go to ED.

## 2016-07-09 NOTE — ED Notes (Signed)
According to patient's best friend, who is now at the bedside, patient has had more trouble with saying certain words and comprehending what is going on. Pt's headache has been present intermittently throughout the day. Per friend, this has happened before and pt has been seen with a neurologist - has appointment tomorrow.

## 2016-07-09 NOTE — ED Provider Notes (Signed)
[facility] DEPT Provider Note   CSN: [id number] Arrival date & time: 07/09/16  2346  By signing my name below, I, Kaitlyn Flowers, attest that this documentation has been prepared under the direction and in the presence of Ripley Fraise, MD . Electronically Signed: Jeanell Flowers, Scribe. 07/09/2016. 11:55 PM.  History   Chief Complaint Chief Complaint  Patient presents with  . Altered Mental Status   The history is provided by a relative and the EMS personnel. The history is limited by the condition of the patient. No language interpreter was used.  Altered Mental Status   This is a recurrent problem. The current episode started 12 to 24 hours ago. The problem has been gradually worsening. Associated symptoms include confusion. Risk factors include a change in prescription.  LEVEL 5 CAVEAT DUE TO AMS  HPI Comments: Kaitlyn Flowers is a 75 y.o. female who presents to the Emergency Department complaining of AMS that started yesterday. EMS states family reported that pt started having difficulty speaking yesterday that worsened today with a sudden onset of headache about an hour ago. Family member states that pt has been having worsening difficulty with reading. She reports having an appointment with pt's PCP and symptoms improved afterwards. She states that pt was started on new blood pressure medication recently. She is supposed to see her neurologist tomorrow. She states that pt had an MRI about a month ago for similar symptoms. She denies any known weakness in pt.     PCP: Shanon Ace , MD  Past Medical History:  Diagnosis Date  . Anemia   . Osteopenia   . PONV (postoperative nausea and vomiting)   . 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  . Wears hearing aid     Patient Active Problem List   Diagnosis Date Noted  . Hyponatremia 04/01/2016  . Aphasia   . Abdominal cramping 01/22/2016  . Nocturnal leg cramps 12/07/2015  . UTI (lower urinary  tract infection) 10/02/2015  . 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 Sacroiliitis ([place]) 06/06/2014  . Lumbar radicular pain 06/06/2014  . Right low back pain 01/07/2014  . Anemia 01/07/2014  . Osteoporosis 12/26/2013  . Glucosuria 12/16/2012  . Tick bite of upper arm 12/16/2012  . UTI (urinary tract infection) 12/16/2012  . Back pain with radiation 06/12/2012  . Plantar wart of right foot 12/29/2011  . 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/11/2011  . Fatigue 11/11/2011  . 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 Urinary frequency 09/02/2011  . Dysuria 09/02/2011  . Medicare annual wellness visit, initial 12/18/2010  . Wears hearing aid 12/18/2010  . Medicare welcome visit 12/18/2010  . Back pain 11/23/2010  . Spinal cord tumor   . ABDOMINAL BRUIT 07/21/2009  . NEOPLASM, SKIN, UNCERTAIN BEHAVIOR [id number]  . HYPERKALEMIA 05/12/2007  . ANEMIA NOS 05/12/2007  . DISORDER, STOMACH FUNCTION NEC 04/07/2007    Past Surgical History:  Procedure Laterality Date  . ABDOMINAL HYSTERECTOMY  1973   1/2 of ovary left cyst on ovary  . APPENDECTOMY     removed with hysterectomy  . LUMBAR WOUND DEBRIDEMENT  12/06/2011   Procedure: LUMBAR WOUND DEBRIDEMENT;  Surgeon: Kristeen Miss, MD;  Location: [facility];  Service: Neurosurgery;  Laterality: N/A;  Repair of Pseudomeningocele Lumbar wound  . ROTATOR CUFF REPAIR     left   . shoulder rotator culff   2006  . spinal cord tumor  1987   2012 october  OB History    No data available       Home Medications    Prior to Admission medications   Medication Sig Start Date End Date Taking? Authorizing Provider  aspirin EC 81 MG tablet Take 1 tablet (81 mg total) by mouth daily. 05/29/16   Cameron Sprang, MD  b complex vitamins capsule Take 1 capsule by mouth daily.    Historical Provider, MD  CALCIUM PO Take 1,000 Units by mouth.    Historical Provider, MD  Cholecalciferol (VITAMIN D3) 5000  UNITS CAPS Take 1 capsule by mouth daily.    Historical Provider, MD  lisinopril (PRINIVIL,ZESTRIL) 5 MG tablet Take 1 tablet (5 mg total) by mouth daily. 06/21/16   Burnis Medin, MD  LYRICA 25 MG capsule  04/27/16   Historical Provider, MD  MAGNESIUM PO Take 1,200 mg by mouth every evening.    Historical Provider, MD  Meth-Hyo-M Bl-Na Phos-Ph Sal (URO-MP) 118 MG CAPS Take 118 mg by mouth 2 (two) times daily. 01/11/16   Historical Provider, MD  Multiple Vitamins-Minerals (OSTEO COMPLEX PO) Take by mouth.    Historical Provider, MD  nitrofurantoin (MACRODANTIN) 100 MG capsule  05/27/16   Historical Provider, MD  nortriptyline (PAMELOR) 25 MG capsule Take 25 mg by mouth at bedtime. 05/07/16   Historical Provider, MD  RAPAFLO 8 MG CAPS capsule Take 8 mg by mouth every evening. 03/25/16   Historical Provider, MD    Family History Family History  Problem Relation Age of Onset  . Stroke Mother     2010  . Heart attack Father   . Anesthesia problems Neg Hx     Social History Social History  Substance Use Topics  . Smoking status: Former Smoker    Packs/day: 0.25    Years: 2.00    Types: Cigarettes    Quit date: 09/03/1967  . Smokeless tobacco: Never Used  . Alcohol use 2.4 oz/week    4 Glasses of wine per week     Allergies   Ibuprofen-famotidine and Penicillins   Review of Systems Review of Systems  Unable to perform ROS: Mental status change  Psychiatric/Behavioral: Positive for confusion.     Physical Exam Updated Vital Signs BP 159/96 (BP Location: Right Arm)   Pulse 88   Temp 98.1 F (36.7 C) (Oral)   Resp 16   Wt 104 lb (47.2 kg)   SpO2 98%   BMI 19.65 kg/m   Physical Exam  Nursing note and vitals reviewed.  CONSTITUTIONAL: Well developed/well nourished HEAD: Normocephalic/atraumatic EYES: EOMI/PERRL ENMT: Mucous membranes moist NECK: supple no meningeal signs SPINE/BACK:entire spine nontender CV: S1/S2 noted, no murmurs/rubs/gallops noted LUNGS: Lungs are  clear to auscultation bilaterally, no apparent distress ABDOMEN: soft, nontender, no rebound or guarding, bowel sounds noted throughout abdomen GU:no cva tenderness NEURO: Pt is awake/alert with mild confusion, moves all extremitiesx4.  No facial droop. No arm or leg drift. No dysarthria. No aphasia.  EXTREMITIES: pulses normal/equal, full ROM SKIN: warm, color normal PSYCH: no abnormalities of mood noted, alert and oriented to situation   ED Treatments / Results  DIAGNOSTIC STUDIES: Oxygen Saturation is 98% on RA, normal by my interpretation.    COORDINATION OF CARE: 11:59 PM- Pt advised of plan for treatment and pt agrees.  Labs (all labs ordered are listed, but only abnormal results are displayed) Labs Reviewed  I-STAT CHEM 8, ED - Abnormal; Notable for the following:       Result Value   Sodium 129 (*)  Chloride 94 (*)    Calcium, Ion 1.13 (*)    Hemoglobin 10.5 (*)    HCT 31.0 (*)    All other components within normal limits  RAPID URINE DRUG SCREEN, HOSP PERFORMED  URINALYSIS, ROUTINE W REFLEX MICROSCOPIC (NOT AT [facility])    EKG  EKG Interpretation  Date/Time:  Tuesday July 09 2016 23:51:29 EST Ventricular Rate:  92 PR Interval:    QRS Duration: 85 QT Interval:  349 QTC Calculation: 432 R Axis:   52 Text Interpretation:  Sinus rhythm RSR' in V1 or V2, probably normal variant Interpretation limited secondary to artifact Confirmed by Christy Gentles  MD, [place] ([id number]) on 07/10/2016 12:14:28 AM       Radiology Ct Head Wo Contrast  Result Date: 07/10/2016 CLINICAL DATA:  Confusion. Difficulty with words and difficulty understanding what is going on. Intermittent headache. Vomiting. EXAM: CT HEAD WITHOUT CONTRAST TECHNIQUE: Contiguous axial images were obtained from the base of the skull through the vertex without intravenous contrast. COMPARISON:  03/25/2016 FINDINGS: Brain: Mild diffuse cerebral atrophy. Ventricular dilatation consistent with central atrophy. Patchy  low-attenuation changes in the deep white matter consistent with small vessel ischemia. No evidence of acute infarction, hemorrhage, hydrocephalus, extra-axial collection or mass lesion/mass effect. Vascular: No hyperdense vessel or unexpected calcification. Skull: Normal. Negative for fracture or focal lesion. Sinuses/Orbits: Opacification of several ethmoid air cells and portion of the left sphenoid sinus. No acute air-fluid levels. Mastoid air cells are not opacified. Other: None. IMPRESSION: No acute intracranial abnormalities. Chronic atrophy and small vessel ischemic changes. Electronically Signed   By: Lucienne Capers M.D.   On: 07/10/2016 00:30    Procedures Procedures   Medications Ordered in ED Medications  fentaNYL (SUBLIMAZE) injection 50 mcg (50 mcg Intravenous Given 07/10/16 0008)  ondansetron (ZOFRAN) injection 4 mg (4 mg Intravenous Given 07/10/16 0008)  sodium chloride 0.9 % bolus 1,000 mL (0 mLs Intravenous Stopped 07/10/16 0337)  metoCLOPramide (REGLAN) injection 10 mg (10 mg Intravenous Given 07/10/16 0106)  diphenhydrAMINE (BENADRYL) injection 25 mg (25 mg Intravenous Given 07/10/16 0106)  lisinopril (PRINIVIL,ZESTRIL) tablet 5 mg (5 mg Oral Given 07/10/16 0340)  acetaminophen (TYLENOL) tablet 650 mg (650 mg Oral Given 07/10/16 0340)     Initial Impression / Assessment and Plan / ED Course  I have reviewed the triage vital signs and the nursing notes.  Pertinent labs & imaging results that were available during my care of the patient were reviewed by me and considered in my medical decision making (see chart for details).  Clinical Course    12:40 AM tPA in stroke considered but not given due to: Onset over 3-4.5hours   Pt with h/o similar symptoms, including admission and outpatient evaluations Seen by dr Delice Lesch previously for transient aphasia and had MRI last month (negative) Also has had echocardiogram and carotid duplex within past year  12:54 AM Pt now back to  baseline Awake/alert, no confusion noted, no aphasia She still has headache which is similar to prior Will focus on treating HA and also give IV fluids for mild HYPOnatremia   Pt improved She was ambulatory, no ataxia She reports mild HA but otherwise at baseline She just started taking lisinopril and did not take dose yesterday, will give dose here.  She is requesting APAP for her headache She is awake/alert, no focal weakness, no aphasia noted She has neurology followup later today and she would like to go home and f/u as outpatient BP 148/72   Pulse 94  Temp 98.1 F (36.7 C) (Oral)   Resp 16   Wt 47.2 kg   SpO2 98%   BMI 19.65 kg/m   Final Clinical Impressions(s) / ED Diagnoses   Final diagnoses:  Other headache syndrome  Essential hypertension  Hyponatremia    New Prescriptions New Prescriptions   No medications on file   I personally performed the services described in this documentation, which was scribed in my presence. The recorded information has been reviewed and is accurate.        Ripley Fraise, MD 07/10/16 [phone]

## 2016-07-09 NOTE — Progress Notes (Signed)
Chief Complaint  Patient presents with  . Altered Mental Status    Patient is having trouble comprehending what she is reading and is getting words confused.    HPI: Kaitlyn Flowers 75 y.o. 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 She called her friend who took her here after calling the triage who made her an appointment for acute confusion and can't read. This is similar to the previous episode she had diagnosed as a possible TIA without associated loss of vision falling dysarthria swallowing difficulty numbness or weakness in any extremity.  Took bp med last pm and had no side effects and did not have a headache last night. She has an appointment with Dr. Delice Lesch neurology tomorrow. She comes with her friend states that her words did not make sense talking. With substitute words. ROS: See pertinent positives and negatives per HPI. No cp sob  syncope but dry mouth getting worse   Due for  Labs  With dr G  Sodium and vit d   Took her asa last pm .   Past Medical History:  Diagnosis Date  . Anemia   . Osteopenia   . PONV (postoperative nausea and vomiting)   . 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  . Wears hearing aid     Family History  Problem Relation Age of Onset  . Stroke Mother     2010  . Heart attack Father   . Anesthesia problems Neg Hx     Social History   Social History  . Marital status: Legally Separated    Spouse name: N/A  . Number of children: N/A  . Years of education: N/A   Social History Main Topics  . Smoking status: Former Smoker    Packs/day: 0.25    Years: 2.00    Types: Cigarettes    Quit date: 09/03/1967  . Smokeless tobacco: Never Used  . Alcohol use 2.4 oz/week    4 Glasses of wine per week  . Drug use: No  . Sexual activity: Not Asked   Other Topics Concern  . 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 aspirin EC 81 MG tablet Take 1 tablet (81 mg total) by mouth daily. 30 tablet 11  . b complex vitamins capsule Take 1 capsule by mouth daily.    Marland Kitchen CALCIUM PO Take 1,000 Units by mouth.    . Cholecalciferol (VITAMIN D3) 5000 UNITS CAPS Take 1 capsule by mouth daily.    Marland Kitchen lisinopril (PRINIVIL,ZESTRIL) 5 MG tablet Take 1 tablet (5 mg total) by mouth daily. 30 tablet 3  . LYRICA 25 MG capsule     . MAGNESIUM PO Take 1,200 mg by mouth every evening.    . Meth-Hyo-M Bl-Na Phos-Ph Sal (URO-MP) 118 MG CAPS Take 118 mg by mouth 2 (two) times daily.  3  . Multiple Vitamins-Minerals (OSTEO COMPLEX PO) Take by mouth.    . nitrofurantoin (MACRODANTIN) 100 MG capsule     . nortriptyline (PAMELOR) 25 MG capsule Take 25 mg by mouth at bedtime.  2  . RAPAFLO 8 MG CAPS  capsule Take 8 mg by mouth every evening.     No facility-administered medications prior to visit.      EXAM:  BP (!) 146/76 (BP Location: Right Arm, Patient Position: Sitting, Cuff Size: Normal)   Temp 97.5 F (36.4 C) (Oral)   Ht 5\' 1"  (1.549 m)   Wt 104 lb 1.6 oz (47.2 kg)   BMI 19.67 kg/m   Body mass index is 19.67 kg/m.  GENERAL: vitals reviewed and listed above, alert, oriented, appears well hydrated and in no acute distress speech is articulat but  Words used are not correct and has some halting speech  Difficulty word finding and  haltting at times      HEENT: atraumatic, conjunctiva  clear, no obvious abnormalities on inspection of external nose and ears OP : no lesion edema or exudate tonguemidline  NECK: no obvious masses on inspection palpation  LUNGS: clear to auscultation bilaterally, no wheezes, rales or rhonchi,  CV: HRRR, no clubbing cyanosis or  peripheral edema nl cap refill  MS: moves all extremities without  noticeable focal  Abnormality some djd   rhom berg neg drift  No tremor noted   Grip adewuate wihtout obv   New Weakness   Mildy anxious      ASSESSMENT AND PLAN:  Discussed the following assessment and plan:  Episode of transient neurologic symptoms - ongoing second episode  - Plan: Basic metabolic panel, CBC with Differential/Platelet, Osmolality, Celiac Ab tTG DGP TIgA  Verbal apraxia  Dry mouth - Plan: Basic metabolic panel, CBC with Differential/Platelet, Osmolality, Celiac Ab tTG DGP TIgA  Hyponatremia - Plan: Basic metabolic panel, CBC with Differential/Platelet, Osmolality, Celiac Ab tTG DGP TIgA  Osteoporosis, unspecified osteoporosis type, unspecified pathological fracture presence - Plan: VITAMIN D 25 Hydroxy (Vit-D Deficiency, Fractures) Similar to previous episode although may be getting better when she tries to read she is understanding more. Fortunately no other associated symptoms. Requested labs can be done here first. Chemistries ordered as well as repeat CBC celiac screen because of her history of anemia and GI gluten isensitivity.( for years)   Her blood pressure is adequate and she didn't have a headache last night. No other focal signs. Advised emergent care but feel it is reasonable  U[pon her request as she thinks getting better  if the symptoms resolve and labs are okay to continue and see Dr Delice Lesch  neurologist tomorrow. However if progressive symptoms she should seek care in emergency room. Will do vit d level as planned per dr Cruzita Lederer  With blood tests.  -Patient advised to return or notify health care team  if symptoms worsen ,persist or new concerns arise. Total visit 40 mins > 50% spent counseling and coordinating care as indicated in above note and in instructions to patient .     Patient Instructions  I think you should be seen in the ed for new episode of confusion and speech if not gone soon    Further emergent evaluation.    I cannot explain the sx .   ?  If stress or more important factors   See your neurology as planned tomorrow .     Standley Brooking. Latara Micheli M.D.

## 2016-07-09 NOTE — ED Notes (Signed)
MD at bedside. 

## 2016-07-09 NOTE — ED Triage Notes (Signed)
Per EMS:  1 hr ago Pt had a sudden onset on H/A.  Pt started to have speech problems yesterday which worsened thing morning. Pt went to PCP the AM. Pt has good recall, grip strength and ext movement.  BP 190/104 on scene and 164/88 on Ambulance NSR of 87 and cbg of 121

## 2016-07-10 ENCOUNTER — Emergency Department (HOSPITAL_COMMUNITY): Payer: Medicare Other

## 2016-07-10 ENCOUNTER — Encounter: Payer: Self-pay | Admitting: Neurology

## 2016-07-10 ENCOUNTER — Ambulatory Visit (INDEPENDENT_AMBULATORY_CARE_PROVIDER_SITE_OTHER): Payer: Medicare Other | Admitting: Neurology

## 2016-07-10 ENCOUNTER — Encounter: Payer: Self-pay | Admitting: Internal Medicine

## 2016-07-10 ENCOUNTER — Ambulatory Visit (INDEPENDENT_AMBULATORY_CARE_PROVIDER_SITE_OTHER): Payer: Medicare Other | Admitting: Internal Medicine

## 2016-07-10 ENCOUNTER — Other Ambulatory Visit: Payer: Medicare Other

## 2016-07-10 VITALS — BP 122/62 | HR 92 | Ht 61.0 in | Wt 104.2 lb

## 2016-07-10 VITALS — BP 126/74 | Ht 61.0 in | Wt 104.0 lb

## 2016-07-10 DIAGNOSIS — R4701 Aphasia: Secondary | ICD-10-CM

## 2016-07-10 DIAGNOSIS — E871 Hypo-osmolality and hyponatremia: Secondary | ICD-10-CM | POA: Diagnosis not present

## 2016-07-10 LAB — I-STAT CHEM 8, ED
BUN: 16 mg/dL (ref 6–20)
Calcium, Ion: 1.13 mmol/L — ABNORMAL LOW (ref 1.15–1.40)
Chloride: 94 mmol/L — ABNORMAL LOW (ref 101–111)
Creatinine, Ser: 0.6 mg/dL (ref 0.44–1.00)
Glucose, Bld: 90 mg/dL (ref 65–99)
HCT: 31 % — ABNORMAL LOW (ref 36.0–46.0)
Hemoglobin: 10.5 g/dL — ABNORMAL LOW (ref 12.0–15.0)
Potassium: 4 mmol/L (ref 3.5–5.1)
Sodium: 129 mmol/L — ABNORMAL LOW (ref 135–145)
TCO2: 24 mmol/L (ref 0–100)

## 2016-07-10 LAB — RAPID URINE DRUG SCREEN, HOSP PERFORMED
Amphetamines: NOT DETECTED
Barbiturates: NOT DETECTED
Benzodiazepines: NOT DETECTED
Cocaine: NOT DETECTED
Opiates: NOT DETECTED
Tetrahydrocannabinol: NOT DETECTED

## 2016-07-10 LAB — OSMOLALITY: Osmolality: 279 mOsm/kg (ref 278–305)

## 2016-07-10 LAB — URINALYSIS, ROUTINE W REFLEX MICROSCOPIC
Bilirubin Urine: NEGATIVE
Glucose, UA: NEGATIVE mg/dL
Hgb urine dipstick: NEGATIVE
Ketones, ur: NEGATIVE mg/dL
Leukocytes, UA: NEGATIVE
Nitrite: NEGATIVE
Protein, ur: NEGATIVE mg/dL
Specific Gravity, Urine: 1.009 (ref 1.005–1.030)
pH: 7.5 (ref 5.0–8.0)

## 2016-07-10 MED ORDER — ONDANSETRON HCL 4 MG/2ML IJ SOLN
4.0000 mg | Freq: Once | INTRAMUSCULAR | Status: AC
  Administered 2016-07-10: 4 mg via INTRAVENOUS
  Filled 2016-07-10: qty 2

## 2016-07-10 MED ORDER — ACETAMINOPHEN 325 MG PO TABS
650.0000 mg | ORAL_TABLET | Freq: Once | ORAL | Status: AC
  Administered 2016-07-10: 650 mg via ORAL
  Filled 2016-07-10: qty 2

## 2016-07-10 MED ORDER — SODIUM CHLORIDE 0.9 % IV BOLUS (SEPSIS)
1000.0000 mL | Freq: Once | INTRAVENOUS | Status: AC
  Administered 2016-07-10: 1000 mL via INTRAVENOUS

## 2016-07-10 MED ORDER — METOCLOPRAMIDE HCL 5 MG/ML IJ SOLN
10.0000 mg | Freq: Once | INTRAMUSCULAR | Status: AC
  Administered 2016-07-10: 10 mg via INTRAVENOUS
  Filled 2016-07-10: qty 2

## 2016-07-10 MED ORDER — LISINOPRIL 10 MG PO TABS
5.0000 mg | ORAL_TABLET | Freq: Once | ORAL | Status: AC
  Administered 2016-07-10: 5 mg via ORAL
  Filled 2016-07-10: qty 1

## 2016-07-10 MED ORDER — DIPHENHYDRAMINE HCL 50 MG/ML IJ SOLN
25.0000 mg | Freq: Once | INTRAMUSCULAR | Status: AC
  Administered 2016-07-10: 25 mg via INTRAVENOUS
  Filled 2016-07-10: qty 1

## 2016-07-10 MED ORDER — FENTANYL CITRATE (PF) 100 MCG/2ML IJ SOLN
50.0000 ug | Freq: Once | INTRAMUSCULAR | Status: AC
  Administered 2016-07-10: 50 ug via INTRAVENOUS
  Filled 2016-07-10: qty 2

## 2016-07-10 MED ORDER — TOPIRAMATE 25 MG PO TABS: ORAL_TABLET | ORAL | 6 refills | Status: DC

## 2016-07-10 MED ORDER — ASPIRIN EC 325 MG PO TBEC: 325.0000 mg | DELAYED_RELEASE_TABLET | Freq: Every day | ORAL | 11 refills | Status: DC

## 2016-07-10 NOTE — Progress Notes (Signed)
NEUROLOGY FOLLOW UP OFFICE NOTE  Kaitlyn Flowers [id number]  HISTORY OF PRESENT ILLNESS: I had the pleasure of seeing Kaitlyn Flowers in follow-up in the neurology clinic on 07/10/2016.  The patient was last seen around 6 weeks ago after an syncopal episode on 7/2, then a transient episode of aphasia on 7/24. She underwent a stroke workup, I personally reviewed images available. 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 MRA head did not show any intracranial stenosis. Carotid dopplers did not show any evidence of ICA stenosis bilaterally. Her echocardiogram showed normal EF 60-65%, normal RV size and systolic function, no significant valvular abnormalities, left atrium normal size. Lipid panel showed an LDL of 114, total cholesterol 241. She started to take a daily baby aspirin.  She saw her PCP on 11/6 reporting random headaches and wondered if related to her BP. She was scheduled for an earlier appointment today, then yesterday had another transient episode of aphasia. 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 When she tried to speak, her words were jumbled. She states the aphasia lasted 30-40 minutes. A friend took her home then to her PCP office at noon, where bloodwork was done. She went to the ER at 11pm, reporting severe headache that started an hour prior. She had an intense pressure and throbbing over the left temporal region, 8/10, with nausea and vomiting. Headache lasted until this morning. No photo/phono/phonophobia. There was no aphasia noted in the ER, headache was treated with IV fluids and APAP. She denied any focal weakness or paresthesias. She denied having a headache with the previous episode of aphasia. She has no prior history of migraines. Over the past 2 weeks, she has been having random headaches localized on different parts  of her head, occurring every 3-4 days, resolving after Tylenol intake.   She has hyponatremia and sees Dr. Cruzita Lederer, last sodium level is 129 (trending down again), previously 132 the day prior. She is now back on fluid restriction.   HPI 05/29/2016: This is a very pleasant 75 yo RH woman who presented after a syncopal episode on 03/03/16 then a transient episode of aphasia on 03/25/16. She lives alone and was sitting outside last 03/03/16. She recalls turning around to get water, then blacked out. She woke up on the ground and found her glasses broken, she hit the right side of her face. No tongue bite/incontinence. She denied any prodromal symptoms, no dizziness or headaches. She had been drinking a lot of water due to recurrent UTIs since January 2017. She went to the ER, it was felt fall was likely a result of orthostatic hypotension from sitting out in the heat. No bloodwork on record from that visit. Then on 03/25/16 she was reading something off her phone and could not make sense of it. She showed the phone to her friend who said it looked fine. Then she found she could not "get/grab my words." Her friend drove her to the ER, she felt her friend was driving the wrong way and she was thinking this, but could not say it. This lasted 20-30 minutes, no associated focal numbness/tingling/weakness, headaches, or dizziness. Her BP on arrival was 139/72. I personally reviewed head CT without contrast which did not show any acute changes. She was found to have a sodium level of 123. She was also diagnosed with another UTI with >  100,000 aerococcus urinae. She denies any further recurrence of symptoms. She denies any olfactory/gustatory hallucinations, deja vu, rising epigastric sensation, focal numbness/tingling/weakness, myoclonic jerks. She has chronic back pain and self-catheterizes herself every night. She has chronic right leg numbness and weakness. For the past 1-1/2 years, she has had pain the right sacral area,  worsened by sitting. She continues to work with PT.   Her mother had a stroke. Otherwise she had a normal birth and early development.  There is no history of febrile convulsions, CNS infections such as meningitis/encephalitis, significant traumatic brain injury, neurosurgical procedures, or family history of seizures.  PAST MEDICAL HISTORY: Past Medical History:  Diagnosis Date  . Anemia   . Osteopenia   . PONV (postoperative nausea and vomiting)   . 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  . Wears hearing aid     MEDICATIONS: Current Outpatient Prescriptions on File Prior to Visit  Medication Sig Dispense Refill  . aspirin EC 81 MG tablet Take 1 tablet (81 mg total) by mouth daily. 30 tablet 11  . b complex vitamins capsule Take 1 capsule by mouth daily.    Marland Kitchen CALCIUM PO Take 1,000 Units by mouth daily.     . Cholecalciferol (VITAMIN D3) 5000 UNITS CAPS Take 1 capsule by mouth daily.    Marland Kitchen lisinopril (PRINIVIL,ZESTRIL) 5 MG tablet Take 1 tablet (5 mg total) by mouth daily. 30 tablet 3  . LYRICA 25 MG capsule Take 25 mg by mouth 2 (two) times daily.     Marland Kitchen MAGNESIUM PO Take 1,200 mg by mouth every evening.    . Meth-Hyo-M Bl-Na Phos-Ph Sal (URO-MP) 118 MG CAPS Take 118 mg by mouth 2 (two) times daily.  3  . Multiple Vitamin (MULTIVITAMIN WITH MINERALS) TABS tablet Take 1 tablet by mouth daily.    . nitrofurantoin (MACRODANTIN) 100 MG capsule Take 100 mg by mouth at bedtime.  11  . nortriptyline (PAMELOR) 25 MG capsule Take 25 mg by mouth at bedtime.  2  . RAPAFLO 8 MG CAPS capsule Take 8 mg by mouth every evening.     No current facility-administered medications on file prior to visit.     ALLERGIES: Allergies  Allergen Reactions  . Ibuprofen-Famotidine Other (See Comments)    Elevated BP  . Penicillins Rash    FAMILY HISTORY: Family History  Problem Relation Age of Onset  . Stroke Mother     2010  . Heart attack Father   . Anesthesia problems  Neg Hx     SOCIAL HISTORY: Social History   Social History  . Marital status: Legally Separated    Spouse name: N/A  . Number of children: N/A  . Years of education: N/A   Occupational History  . Not on file.   Social History Main Topics  . Smoking status: Former Smoker    Packs/day: 0.25    Years: 2.00    Types: Cigarettes    Quit date: 09/03/1967  . Smokeless tobacco: Never Used  . Alcohol use 2.4 oz/week    4 Glasses of wine per week  . Drug use: No  . Sexual activity: Not on file   Other Topics Concern  . 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   Born in [place] calls [place] home in [place] since [place]: Constitutional: No fevers, chills, or  sweats, no generalized fatigue, change in appetite Eyes: No visual changes, double vision, eye pain Ear, nose and throat: No hearing loss, ear pain, nasal congestion, sore throat Cardiovascular: No chest pain, palpitations Respiratory:  No shortness of breath at rest or with exertion, wheezes GastrointestinaI: No nausea, vomiting, diarrhea, abdominal pain, fecal incontinence Genitourinary:  No dysuria, urinary retention or frequency Musculoskeletal:  + neck pain, back pain Integumentary: No rash, pruritus, skin lesions Neurological: as above Psychiatric: No depression, insomnia, anxiety Endocrine: No palpitations, fatigue, diaphoresis, mood swings, change in appetite, change in weight, increased thirst Hematologic/Lymphatic:  No anemia, purpura, petechiae. Allergic/Immunologic: no itchy/runny eyes, nasal congestion, recent allergic reactions, rashes  PHYSICAL EXAM: Vitals:   07/10/16 1605  BP: 126/74   General: No acute distress Head:  Normocephalic/atraumatic Neck: supple, no paraspinal tenderness, full range of motion Heart:  Regular rate and rhythm Lungs:  Clear to auscultation bilaterally Back: No paraspinal  tenderness Skin/Extremities: No rash, no edema Neurological Exam: alert and oriented to person, place, and time. No aphasia or dysarthria. Fund of knowledge is appropriate.  Recent and remote memory are intact.  Attention and concentration are normal.    Able to name objects and repeat phrases, read and write. Cranial nerves: Pupils equal, round, reactive to light.  Fundoscopic exam unremarkable, no papilledema. Extraocular movements intact with no nystagmus. Visual fields full. Facial sensation intact. No facial asymmetry. Tongue, uvula, palate midline.  Motor: Bulk and tone normal, muscle strength 5/5 throughout with no pronator drift.  Sensation to light touch, temperature and vibration intact.  No extinction to double simultaneous stimulation.  Deep tendon reflexes 2+ throughout, toes downgoing.  Finger to nose testing intact.  Gait narrow-based and steady, able to tandem walk adequately.  Romberg negative.  IMPRESSION: This is a pleasant 74 yo RH woman who had a syncopal episode on 03/03/16, then a transient episode of expressive aphasia last 03/25/16. Stroke workup was unremarkable, MRI brain did not show any acute changes, no significant stenosis seen intracranially or with carotid dopplers. Echo normal. She had another transient episode of aphasia yesterday, this time followed by intense headache. No headache with previous event. It would be unusual to have a TIA with exactly the same symptoms in the setting of normal intra and extracranial vasculature. She was however instructed to increase aspirin to 325mg  daily. Other considerations for recurrent stereotyped events include focal seizure versus complicated migraine (unusual to start having migraines at this age). A routine EEG will be ordered. She will start Topamax for headache and possible seizure prophylaxis, 25mg  qhs x 1 week, then increase to 25mg  BID. Side effects were discussed, we will plan to uptitrate further as tolerated. She is aware of [place]  driving laws that indicate one should not drive after an episode of loss of consciousness until 6 months event-free. She knows to go to the ER if symptoms recur or progress. She will follow-up in 2 months.  Thank you for allowing me to participate in her care.  Please do not hesitate to call for any questions or concerns.  The duration of this appointment visit was 25 minutes of face-to-face time with the patient.  Greater than 50% of this time was spent in counseling, explanation of diagnosis, planning of further management, and coordination of care.   Ellouise Newer, M.D.   CC: Dr. Regis Bill

## 2016-07-10 NOTE — Discharge Instructions (Signed)

## 2016-07-10 NOTE — Patient Instructions (Signed)
1. Schedule routine EEG for tomorrow. Manuela Schwartz, our tech, will call you in the morning to let you know what time we will do the study 2. After the EEG, start Topamax 25mg : take 1 at bedtime for 1 week, then increase to 1 tablet twice a day 3. Increase aspirin to 325mg  daily 4. If you start having another spell, try taking a Tylenol to see if this aborts the episode 5. As per [place] driving laws, after an episode of loss of consciousness, no driving until 6 months event-free

## 2016-07-10 NOTE — Patient Instructions (Signed)
Please limit the intake of fluids to 50 oz per day.  Let me know if you have problems doing this, in that case, will try to add sodium tablets.  In the meantime, please add a little more salt to food.  Come back in 2 weeks for another sodium level.  Please come back for a follow-up appointment in 3 months.

## 2016-07-10 NOTE — Progress Notes (Signed)
Patient ID: Kaitlyn Flowers, female   DOB: [birth year], 75 y.o.   MRN: [id number]   HPI  Kaitlyn Flowers is a 75 y.o.-year-old female, returning for f/u for of hyponatremia. She also has OP. Last visit 2 weeks ago.  She again developed reading aphasia, confusion and thenverbal aphasia yesterday am - lasted 30 min >> saw PCP (Na 132) >> the episode repeated last night, also + HA >> went to the ED (Na 129).  She has an appt with Dr Delice Lesch (neuro) today, also.  She has a dry mouth >> uses Biotene spray >> helping.  Hyponatremia:  Reviewed and addended hx: Pt had orthostatic syncope 03/03/2016 >> ED. She then returned to ED with diffic. Speaking, mild confusion and blurry vision on 03/25/2016 >> CT head neg. for stroke >> assumed to be a TIA or neuro changes from UTI. Na was 123 then.  She started to have UTIs beginning of 2017 (pseudomonas), had PICC line >> then oral ABx. She had several recurrences.  She had a urinary catheter - which she was using 3x a day as it was suspected that she could not empty the bladder well. The frequency was then reduced to every other day, but she developed a new UTI, and now she is again cathing every day.  She started to drink more water in last month as she developed dry mouth. She was also trying to increase water intake with her UTIs.  I and Os - usually: - ~68 oz in (~2L) - ~65 oz out (~1.9L)  However, before last visit, she started to drink more water as her urine was more concentrated and she was afraid she may develop a UTI again. I advised her to not drink more than approximately 1.5 L of fluid per day (50 ounces), but she mentions that she was not completely following this advice.  She started to get occasional HAs in the middle of the night in the last month. These are sharp, in vertex, associated with R eye blurriness.  No vertigo, dizziness.   MRI/MRA on 06/08/2016 did not show a pituitary mass. It showed chronic microvascular ds.  Reviewed  Na levels: Lab Results  Component Value Date   NA 129 (L) 07/10/2016   NA 132 (L) 07/09/2016   NA 130 (L) 06/21/2016   NA 132 (L) 04/02/2016   NA 126 (L) 03/27/2016   NA 124 (L) 03/25/2016   NA 123 (L) 03/25/2016   NA 135 02/13/2016   NA 135 11/02/2015   NA 135 07/11/2014   NA 132 (L) 02/10/2014   NA 139 08/24/2012   NA 138 08/22/2012   NA 140 11/11/2011   NA 139 11/27/2010   Other pertinent labs (04/02/2016): Osmolality     278 - 305 mOsm/kg 271 (L)  Cortisol, Plasma     ug/dL 10.2  C206 ACTH     6 - 50 pg/mL 16  Uric Acid, Serum     2.4 - 7.0 mg/dL 2.7  Osmolality, Urine     50 - 1,200 mOsm/kg 304  Potassium Urine     12 - 129 mmol/L 28  Sodium, Urine     28 - 272 mmol/L 77  Labs pointed towards improving SIADH/water O/L without appropriate renal water wasting. No adrenal insufficiency. No hypothyroidism. No HTG.  OP: Reviewed and addended hx: Pt was dx with OP in 2015. She denies any falls but broke her R ankle roller skating.   No dizziness/vertigo/orthostasis.  I reviewed pt's DEXA scans:  Date L1-L4 T score FN T score  12/10/2013 -1.7 RFN: -2.7 LFN: -2.5   L1-L4 decreased by 5.8% from 2012 Total mean hip BMD decreased 7.7%* from 2012   01/08/2016 Lumbar spine (L1-L4) Femoral neck (FN)  T-score - 1.9  RFN: - 2.5 LFN: - 2.7  Change in BMD from previous DXA test (%) - 1.9%  + 0.4%   (*) statistically significant  I suggested Fosamax 70 mg weekly at last visit >> refused in 03/2016.  No h/o hyper/hypocalcemia. No h/o kidney stones. Lab Results  Component Value Date   PTH 59 02/13/2016   PTH 66 (H) 07/11/2014   CALCIUM 9.8 07/09/2016   CALCIUM 10.1 06/21/2016   CALCIUM 9.9 04/02/2016   CALCIUM 9.6 03/27/2016   CALCIUM 9.6 03/25/2016   CALCIUM 9.8 02/13/2016   CALCIUM 9.3 02/13/2016   CALCIUM 10.1 11/02/2015   CALCIUM 10.0 07/11/2014   CALCIUM 9.8 07/11/2014   No h/o thyrotoxicosis. Reviewed TSH recent levels:  Lab Results  Component Value  Date   TSH 1.28 02/13/2016   TSH 1.21 07/11/2014   TSH 1.11 11/11/2011   TSH 1.12 11/27/2010   + h/o vitamin D deficiency.  She was taking 5000 IU vit D daily >> stopped after last check.  Last vitamin D: Lab Results  Component Value Date   VD25OH 55.43 07/09/2016   VD25OH 94.31 04/02/2016   VD25OH 60.65 02/10/2014    She does take calcium supplements: calcium citrate. She eats dairy and green, leafy, vegetables.   No h/o CKD. Last BUN/Cr: Lab Results  Component Value Date   BUN 16 07/10/2016   CREATININE 0.60 07/10/2016   + weight bearing exercises: walks, Pilates.  She does not take high vitamin A doses.   Pt does have a FH of osteoporosis in mother/sisters. + fractures: kyphosis in mother.   Menopause was at 26 - surgical TAH + BSO   ROS: Constitutional: no weight gain/loss, no fatigue, no subjective hyperthermia/hypothermia, no nocturia  Eyes: + blurry vision, no xerophthalmia ENT: no sore throat, no nodules palpated in throat, no dysphagia/odynophagia, no hoarseness, + decreased hearing Cardiovascular: no CP/SOB/palpitations/leg swelling Respiratory: no cough/SOB Gastrointestinal: no N/V/D/C Musculoskeletal: no muscle/joint aches  Skin: no rashes Neurological: no tremors/numbness/tingling/dizziness, + HA + see HPI  I reviewed pt's medications, allergies, PMH, social hx, family hx, and changes were documented in the history of present illness. Otherwise, unchanged from my initial visit note.  Past Medical History:  Diagnosis Date  . Anemia   . Osteopenia   . PONV (postoperative nausea and vomiting)   . 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  . Wears hearing aid    Past Surgical History:  Procedure Laterality Date  . ABDOMINAL HYSTERECTOMY  1973   1/2 of ovary left cyst on ovary  . APPENDECTOMY     removed with hysterectomy  . LUMBAR WOUND DEBRIDEMENT  12/06/2011   Procedure: LUMBAR WOUND DEBRIDEMENT;  Surgeon: Kristeen Miss, MD;   Location: [facility];  Service: Neurosurgery;  Laterality: N/A;  Repair of Pseudomeningocele Lumbar wound  . ROTATOR CUFF REPAIR     left   . shoulder rotator culff   2006  . spinal cord tumor  1987   2012 october    History   Social History  . Marital Status: married    Spouse Name: N/A    Number of Children: 2   Occupational History  . consultant   Social History Main Topics  .  Smoking status: Former Smoker -- 0.25 packs/day for 2 years    Types: Cigarettes    Quit date: 09/03/1967  . Smokeless tobacco: Not on file  . Alcohol Use: 2.4 oz/week    4-5 Glasses of wine per week  . 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 aspirin EC 81 MG tablet Take 1 tablet (81 mg total) by mouth daily. 30 tablet 11  . b complex vitamins capsule Take 1 capsule by mouth daily.    Marland Kitchen CALCIUM PO Take 1,000 Units by mouth daily.     Marland Kitchen lisinopril (PRINIVIL,ZESTRIL) 5 MG tablet Take 1 tablet (5 mg total) by mouth daily. 30 tablet 3  . LYRICA 25 MG capsule Take 25 mg by mouth 2 (two) times daily.     Marland Kitchen MAGNESIUM PO Take 1,200 mg by mouth every evening.    . Meth-Hyo-M Bl-Na Phos-Ph Sal (URO-MP) 118 MG CAPS Take 118 mg by mouth 2 (two) times daily.  3  . Multiple Vitamin (MULTIVITAMIN WITH MINERALS) TABS tablet Take 1 tablet by mouth daily.    . nitrofurantoin (MACRODANTIN) 100 MG capsule Take 100 mg by mouth at bedtime.  11  . nortriptyline (PAMELOR) 25 MG capsule Take 25 mg by mouth at bedtime.  2  . RAPAFLO 8 MG CAPS capsule Take 8 mg by mouth every evening.    . Cholecalciferol (VITAMIN D3) 5000 UNITS CAPS Take 1 capsule by mouth daily.     No current facility-administered medications on file prior to visit.    Allergies  Allergen Reactions  . Ibuprofen-Famotidine Other (See  Comments)    Elevated BP  . Penicillins Rash   Family History  Problem Relation Age of Onset  . Stroke Mother     2010  . Heart attack Father   . Anesthesia problems Neg Hx    PE: BP 122/62 (BP Location: Left Arm, Patient Position: Sitting, Cuff Size: Normal)   Pulse 92   Ht 5\' 1"  (1.549 m)   Wt 104 lb 3.2 oz (47.3 kg)   SpO2 94%   BMI 19.69 kg/m  Wt Readings from Last 3 Encounters:  07/10/16 104 lb 3.2 oz (47.3 kg)  07/09/16 104 lb (47.2 kg)  07/09/16 104 lb 1.6 oz (47.2 kg)   Constitutional: normal weight, in NAD. No kyphosis. Eyes: PERRLA, EOMI, no exophthalmos ENT: moist mucous membranes, no thyromegaly, no cervical lymphadenopathy Cardiovascular: RRR, No MRG Respiratory: CTA B Gastrointestinal: abdomen soft, NT, ND, BS+ Musculoskeletal: no deformities, strength intact in all 4 Skin: moist, warm, no rashes Neurological: no tremor with outstretched hands, DTR normal in all 4  Assessment: 1. Hyponatremia  2. Osteoporosis  Plan: 1. Hyponatremia - pt developed hyponatremia abruptly in 03/2016. She presented initially with orthostatic syncope, and then with difficulty speaking/mild confusion/blurry vision in the setting of UTI. Her head CT was normal and her symptoms resolved. (She also subsequently had an MRI/MRA which did not show any obvious pathology, other than chronic microvascular ischemic changes). At that time, sodium was 123. Subsequent sodium levels have been improving. She did have another episode of aphasia yesterday a.m. for which she saw PCP. Sodium level was mildly low, at 132. She developed another episode of aphasia last night, accompanied by headache, so  she presented to the ED. A sodium level returned lower, at 129. Her aphasia headache resolved since. She tells me that she was not completely compliant with fluid restriction of 1.5 L daily, and I advised her to try to do this for the next 2 weeks and recheck her sodium afterwards. She has problems with a  fluid restriction or the sodium is not improved, we may need to add sodium tablets, probably 1 g per day, however, I am reticent to do this, since she has paroxysmal hypertension. I also advised her to continue Biotene spray for dry mouth since this is helping, and to use a humidifier in her bedroom  - I again reviewed the lab work from last visit and this point towards either SIADH or increase water intake that overcomes renal mechanisms of urine dilution. Treatment of choice for this condition is fluid restriction. - We will plan to recheck her sodium in 2 weeks from now  - I will see her back in 3 months for another visit  2. Osteoporosis - will check another DEXA scan next year - latest vit D normal  Philemon Kingdom, MD PhD [facility]

## 2016-07-11 ENCOUNTER — Ambulatory Visit (INDEPENDENT_AMBULATORY_CARE_PROVIDER_SITE_OTHER): Payer: Medicare Other | Admitting: Neurology

## 2016-07-11 DIAGNOSIS — R4701 Aphasia: Secondary | ICD-10-CM | POA: Diagnosis not present

## 2016-07-12 ENCOUNTER — Telehealth: Payer: Self-pay | Admitting: Neurology

## 2016-07-12 MED ORDER — LORAZEPAM 1 MG PO TABS: ORAL_TABLET | ORAL | 4 refills | Status: DC

## 2016-07-12 MED ORDER — BUTALBITAL-APAP-CAFFEINE 50-325-40 MG PO TABS: ORAL_TABLET | ORAL | 4 refills | Status: DC

## 2016-07-12 NOTE — Telephone Encounter (Signed)
Contacted patient. She states her headaches/ nausea/ high blood pressure are back today. She has been taking Aspirin and had her first does of Topamax last night. She states she is afraid of going into the weekend feeling like this. She is also asking about her EEG result.

## 2016-07-12 NOTE — Telephone Encounter (Signed)
PT left a message asking about the testing she had yesterday and also to say she is having headaches/Dawn CB# [phone]

## 2016-07-12 NOTE — Telephone Encounter (Signed)
Spoke to patient, having a headache, followed by BP. Checked and it is 145/80. Headache is in the frontal vertex region, pressure, throbbing, 6 or 7 over 10; took 6 Tylenol already today. No aphasic symptoms. Discussed EEG findings showing left>right temporal slowing, left temporal epileptiform discharges. This is likely the cause of her recurrent aphasia. Would start increasing Topamax to 25mg  BID. She will be prescribed Fioricet for the headaches, and if she feels that symptoms are progressing, can take 1mg  PO Ativan for seizure rescue. She was instructed not to drive. She knows to call for any changes.

## 2016-07-17 ENCOUNTER — Telehealth: Payer: Self-pay | Admitting: Neurology

## 2016-07-17 NOTE — Telephone Encounter (Signed)
Kaitlyn Flowers 07/07/2041. She has a question for you about her medication Topiramate. Her # is [id number]. Thank you

## 2016-07-17 NOTE — Telephone Encounter (Signed)
Patient was calling back needing to speak with you about her medication. Thank you

## 2016-07-17 NOTE — Telephone Encounter (Signed)
Contacted patient and she states the Topamax is making her very sleepy and depressed. She states she never increased medication to BID. Patient also states she has not had a seizure since starting medication but she no longer wants to take it. Wants to know if she can try something else.

## 2016-07-17 NOTE — Telephone Encounter (Signed)
Notified patient of below. She states she wants to try taking the Topamax 1/2 tablet and will let us know how that does at her follow-up on 11/20. Patient advised to contact the office if any other questions or concerns.

## 2016-07-17 NOTE — Telephone Encounter (Signed)
Pls let her know that all the seizure medications act on the brain, so a lot of them do cause drowsiness. It can take a couple of weeks for body to get used to the medication. Does she want to try reducing to 1/2 tablet qhs first and taking earlier at dinner? If not, can start Zonisamide 100mg  qhs, it is taken at bedtime so if drowsiness occurs, she just goes to sleep. Some people notice tingling in hands and feet, do not stop for this reason, this usually goes away. If she wants to switch, pls send Rx for Zonisamide. Thanks

## 2016-07-22 ENCOUNTER — Encounter: Payer: Self-pay | Admitting: Neurology

## 2016-07-22 ENCOUNTER — Telehealth: Payer: Self-pay | Admitting: Pediatrics

## 2016-07-22 ENCOUNTER — Other Ambulatory Visit (INDEPENDENT_AMBULATORY_CARE_PROVIDER_SITE_OTHER): Payer: Medicare Other

## 2016-07-22 ENCOUNTER — Ambulatory Visit (INDEPENDENT_AMBULATORY_CARE_PROVIDER_SITE_OTHER): Payer: Medicare Other | Admitting: Neurology

## 2016-07-22 VITALS — BP 118/72 | HR 99 | Ht 61.0 in | Wt 101.6 lb

## 2016-07-22 DIAGNOSIS — G40009 Localization-related (focal) (partial) idiopathic epilepsy and epileptic syndromes with seizures of localized onset, not intractable, without status epilepticus: Secondary | ICD-10-CM | POA: Diagnosis not present

## 2016-07-22 DIAGNOSIS — M810 Age-related osteoporosis without current pathological fracture: Secondary | ICD-10-CM

## 2016-07-22 DIAGNOSIS — E871 Hypo-osmolality and hyponatremia: Secondary | ICD-10-CM

## 2016-07-22 LAB — SODIUM: Sodium: 132 mEq/L — ABNORMAL LOW (ref 135–145)

## 2016-07-22 LAB — VITAMIN D 25 HYDROXY (VIT D DEFICIENCY, FRACTURES): VITD: 64.3 ng/mL (ref 30.00–100.00)

## 2016-07-22 NOTE — Telephone Encounter (Signed)
Pt was advised of the message at lab appt today. She voiced understanding.

## 2016-07-22 NOTE — Telephone Encounter (Signed)
I left message on patients voicemail in regards to advice. Voiced to call back with other questions.

## 2016-07-22 NOTE — Progress Notes (Signed)
NEUROLOGY FOLLOW UP OFFICE NOTE  Kaitlyn Flowers [id number]  HISTORY OF PRESENT ILLNESS: I had the pleasure of seeing Kaitlyn Flowers in follow-up in the neurology clinic on 07/22/2016. She is accompanied by her friend today. The patient was last seen around 2 weeks ago after an syncopal episode on 7/2, then a transient episode of aphasia on 7/24 and again last 07/21/16. Stroke workup in July was unremarkable. She started taking full dose aspirin. With recurrence of transient aphasia, this time with headache, considerations included seizure versus complicated migraine. She had an EEG done which showed bilateral temporal slowing, left greater than right, at times sharply contoured. There were a few sharp transients that appeared more epileptogenic over the left frontotemporal region. She was started on Topamax, however reports side effects on this where she feels drowsy and depressed even on reduced dose.   HPI 05/29/2016: This is a very pleasant 75 yo RH woman who presented after a syncopal episode on 03/03/16 then a transient episode of aphasia on 03/25/16. She lives alone and was sitting outside last 03/03/16. She recalls turning around to get water, then blacked out. She woke up on the ground and found her glasses broken, she hit the right side of her face. No tongue bite/incontinence. She denied any prodromal symptoms, no dizziness or headaches. She had been drinking a lot of water due to recurrent UTIs since January 2017. She went to the ER, it was felt fall was likely a result of orthostatic hypotension from sitting out in the heat. No bloodwork on record from that visit. Then on 03/25/16 she was reading something off her phone and could not make sense of it. She showed the phone to her friend who said it looked fine. Then she found she could not "get/grab my words." Her friend drove her to the ER, she felt her friend was driving the wrong way and she was thinking this, but could not say it. This  lasted 20-30 minutes, no associated focal numbness/tingling/weakness, headaches, or dizziness. Her BP on arrival was 139/72. I personally reviewed head CT without contrast which did not show any acute changes. She was found to have a sodium level of 123. She was also diagnosed with another UTI with >100,000 aerococcus urinae. She denies any further recurrence of symptoms. She denies any olfactory/gustatory hallucinations, deja vu, rising epigastric sensation, focal numbness/tingling/weakness, myoclonic jerks. She has chronic back pain and self-catheterizes herself every night. She has chronic right leg numbness and weakness. For the past 1-1/2 years, she has had pain the right sacral area, worsened by sitting. She continues to work with PT.   She underwent a stroke workup, I personally reviewed images available. 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 MRA head did not show any intracranial stenosis. Carotid dopplers did not show any evidence of ICA stenosis bilaterally. Her echocardiogram showed normal EF 60-65%, normal RV size and systolic function, no significant valvular abnormalities, left atrium normal size. Lipid panel showed an LDL of 114, total cholesterol 241. She started to take a daily baby aspirin.  She saw her PCP on 11/6 reporting random headaches and wondered if related to her BP. She was scheduled for an earlier appointment today, then yesterday had another transient episode of aphasia. 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 When she tried to speak, her words were jumbled. She  states the aphasia lasted 30-40 minutes. A friend took her home then to her PCP office at noon, where bloodwork was done. She went to the ER at 11pm, reporting severe headache that started an hour prior. She had an intense pressure and throbbing over the left  temporal region, 8/10, with nausea and vomiting. Headache lasted until this morning. No photo/phono/phonophobia. There was no aphasia noted in the ER, headache was treated with IV fluids and APAP. She denied any focal weakness or paresthesias. She denied having a headache with the previous episode of aphasia. She has no prior history of migraines. Over the past 2 weeks, she has been having random headaches localized on different parts of her head, occurring every 3-4 days, resolving after Tylenol intake.   Her mother had a stroke. Otherwise she had a normal birth and early development.  There is no history of febrile convulsions, CNS infections such as meningitis/encephalitis, significant traumatic brain injury, neurosurgical procedures, or family history of seizures.  PAST MEDICAL HISTORY: Past Medical History:  Diagnosis Date  . Anemia   . Osteopenia   . PONV (postoperative nausea and vomiting)   . 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  . Wears hearing aid     MEDICATIONS: Current Outpatient Prescriptions on File Prior to Visit  Medication Sig Dispense Refill  . aspirin EC 325 MG tablet Take 1 tablet (325 mg total) by mouth daily. 30 tablet 11  . b complex vitamins capsule Take 1 capsule by mouth daily.    . butalbital-acetaminophen-caffeine (FIORICET, ESGIC) 50-325-40 MG tablet Take 1 tablet as needed twice a day for headache. Do not take more than 2-3 times a week 10 tablet 4  . CALCIUM PO Take 1,000 Units by mouth daily.     . Cholecalciferol (VITAMIN D3) 5000 UNITS CAPS Take 1 capsule by mouth daily.    Marland Kitchen lisinopril (PRINIVIL,ZESTRIL) 5 MG tablet Take 1 tablet (5 mg total) by mouth daily. 30 tablet 3  . LORazepam (ATIVAN) 1 MG tablet Take 1 tablet as needed for seizure. Do not take more than 2 tablets in a 24 hour period 10 tablet 4  . LYRICA 25 MG capsule Take 25 mg by mouth 2 (two) times daily.     Marland Kitchen MAGNESIUM PO Take 1,200 mg by mouth every evening.    .  Meth-Hyo-M Bl-Na Phos-Ph Sal (URO-MP) 118 MG CAPS Take 118 mg by mouth 2 (two) times daily.  3  . Multiple Vitamin (MULTIVITAMIN WITH MINERALS) TABS tablet Take 1 tablet by mouth daily.    . nitrofurantoin (MACRODANTIN) 100 MG capsule Take 100 mg by mouth at bedtime.  11  . nortriptyline (PAMELOR) 25 MG capsule Take 25 mg by mouth at bedtime.  2  . RAPAFLO 8 MG CAPS capsule Take 8 mg by mouth every evening.    . topiramate (TOPAMAX) 25 MG tablet Take 1 tablet at night for 1 week, then increase to 1 tablet twice a day 60 tablet 6   No current facility-administered medications on file prior to visit.     ALLERGIES: Allergies  Allergen Reactions  . Ibuprofen-Famotidine Other (See Comments)    Elevated BP  . Penicillins Rash    FAMILY HISTORY: Family History  Problem Relation Age of Onset  . Stroke Mother     2010  . Heart attack Father   . Anesthesia problems Neg Hx     SOCIAL HISTORY: Social History   Social History  .  Marital status: Legally Separated    Spouse name: N/A  . Number of children: N/A  . Years of education: N/A   Occupational History  . Not on file.   Social History Main Topics  . Smoking status: Former Smoker    Packs/day: 0.25    Years: 2.00    Types: Cigarettes    Quit date: 09/03/1967  . Smokeless tobacco: Never Used  . Alcohol use 2.4 oz/week    4 Glasses of wine per week  . Drug use: No  . Sexual activity: Not on file   Other Topics Concern  . 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   Born in [place] calls [address] home in [place]: Constitutional: No fevers, chills, or sweats, no generalized fatigue, change in appetite Eyes: No visual changes, double vision, eye pain Ear, nose and throat: No hearing loss, ear pain, nasal congestion, sore throat Cardiovascular: No chest pain, palpitations Respiratory:  No shortness of breath at rest or with exertion,  wheezes GastrointestinaI: No nausea, vomiting, diarrhea, abdominal pain, fecal incontinence Genitourinary:  No dysuria, urinary retention or frequency Musculoskeletal:  + neck pain, back pain Integumentary: No rash, pruritus, skin lesions Neurological: as above Psychiatric: No depression, insomnia, anxiety Endocrine: No palpitations, fatigue, diaphoresis, mood swings, change in appetite, change in weight, increased thirst Hematologic/Lymphatic:  No anemia, purpura, petechiae. Allergic/Immunologic: no itchy/runny eyes, nasal congestion, recent allergic reactions, rashes  PHYSICAL EXAM: Vitals:   07/22/16 1109  BP: 118/72  Pulse: 99   General: No acute distress Head:  Normocephalic/atraumatic Neck: supple, no paraspinal tenderness, full range of motion Heart:  Regular rate and rhythm Lungs:  Clear to auscultation bilaterally Back: No paraspinal tenderness Skin/Extremities: No rash, no edema Neurological Exam: alert and oriented to person, place, and time. No aphasia or dysarthria. Fund of knowledge is appropriate.  Recent and remote memory are intact.  Attention and concentration are normal.    Able to name objects and repeat phrases, read and write. Cranial nerves: Pupils equal, round, reactive to light.  Fundoscopic exam unremarkable, no papilledema. Extraocular movements intact with no nystagmus. Visual fields full. Facial sensation intact. No facial asymmetry. Tongue, uvula, palate midline.  Motor: Bulk and tone normal, muscle strength 5/5 throughout with no pronator drift.  Sensation to light touch, temperature and vibration intact.  No extinction to double simultaneous stimulation.  Deep tendon reflexes 2+ throughout, toes downgoing.  Finger to nose testing intact.  Gait narrow-based and steady, able to tandem walk adequately.  Romberg negative.  IMPRESSION: This is a pleasant 75 yo RH woman who had a syncopal episode on 03/03/16, then a transient episode of expressive aphasia last  03/25/16. Stroke workup was unremarkable, MRI brain did not show any acute changes, no significant stenosis seen intracranially or with carotid dopplers. Echo normal. She had another transient episode of aphasia on 07/21/16, this time followed by intense headache. No headache with previous event. We again discussed how it would be unusual to have a TIA with exactly the same symptoms in the setting of normal intra and extracranial vasculature. Her EEG was abnormal with bilateral temporal slowing and rare left frontotemporal epileptiform discharges. 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 She is having difficulties tolerating the Topamax for seizure and headache prophylaxis. She is requesting this be discontinued, we discussed starting a different  seizure medication such as Zonisamide or Lamotrigine, she would like to wait and go on vacation first before considering this. She has prn lorazepam to take if she starts having recurrent symptoms. She is aware of [place] driving laws to stop driving after a seizure until 6 months seizure-free. She will follow-up in 2 months.   Thank you for allowing me to participate in her care.  Please do not hesitate to call for any questions or concerns.  The duration of this appointment visit was 25 minutes of face-to-face time with the patient.  Greater than 50% of this time was spent in counseling, explanation of diagnosis, planning of further management, and coordination of care.   Ellouise Newer, M.D.   CC: Dr. Regis Bill

## 2016-07-22 NOTE — Patient Instructions (Signed)
1. Stop Topamax. Call our office once you are feeling better and we will discuss other medication options 2. May take the rescue lorazepam if you start having the speech and reading difficulties 3. Follow-up as scheduled in January  Seizure Precautions: 1. If medication has been prescribed for you to prevent seizures, take it exactly as directed.  Do not stop taking the medicine without talking to your doctor first, even if you have not had a seizure in a long time.   2. Avoid activities in which a seizure would cause danger to yourself or to others.  Don't operate dangerous machinery, swim alone, or climb in high or dangerous places, such as on ladders, roofs, or girders.  Do not drive unless your doctor says you may.  3. If you have any warning that you may have a seizure, lay down in a safe place where you can't hurt yourself.    4.  No driving for 6 months from last seizure, as per [facility].   Please refer to the following link on the [place] website for more information: http://[email or web address]/[email or web address]/drivingu.cfm   5.  Maintain good sleep hygiene. Avoid alcohol.  6.  Contact your doctor if you have any problems that may be related to the medicine you are taking.  7.  Call 911 and bring the patient back to the ED if:        A.  The seizure lasts longer than 5 minutes.       B.  The patient doesn't awaken shortly after the seizure  C.  The patient has new problems such as difficulty seeing, speaking or moving  D.  The patient was injured during the seizure  E.  The patient has a temperature over 102 F (39C)  F.  The patient vomited and now is having trouble breathing

## 2016-07-22 NOTE — Telephone Encounter (Signed)
Pt called stating she is supposed to have labs drawn to check sodium-2 wks from last appt per 11/8 note. She requested to come today due to appt with Dr Haskell Riling.  She states she has concerns about low sodium causing seizures.  I sch lab appt for today to reck sodium and advised I would let MD know about concern. She voiced understanding and agreed with plan.

## 2016-07-22 NOTE — Telephone Encounter (Signed)
Her Na was not low enough to cause seizures. I will let her know about the new results as soon as it returns.

## 2016-07-28 ENCOUNTER — Other Ambulatory Visit: Payer: Self-pay | Admitting: Sports Medicine

## 2016-07-29 ENCOUNTER — Ambulatory Visit: Payer: Medicare Other | Admitting: Neurology

## 2016-07-29 NOTE — Telephone Encounter (Signed)
Rx refill request

## 2016-08-01 ENCOUNTER — Ambulatory Visit: Payer: Medicare Other | Admitting: Internal Medicine

## 2016-08-01 DIAGNOSIS — G40009 Localization-related (focal) (partial) idiopathic epilepsy and epileptic syndromes with seizures of localized onset, not intractable, without status epilepticus: Secondary | ICD-10-CM | POA: Insufficient documentation

## 2016-08-01 NOTE — Procedures (Signed)
ELECTROENCEPHALOGRAM REPORT  Date of Study: 07/11/2016  Patient's Name: Kaitlyn Flowers MRN: [id number] Date of Birth: 1940/11/10  Referring Provider: Dr. Ellouise Newer  Clinical History: This is a 75 year old woman with an unwitnessed syncopal episode and 2 episodes of transient aphasia.  Medications: Lyrica, asprin, calcium, vitamin D, Prinivil, Zestril, Pamelor, Rapaflo  Technical Summary: A multichannel digital EEG recording measured by the international 10-20 system with electrodes applied with paste and impedances below 5000 ohms performed in our laboratory with EKG monitoring in an awake and asleep patient.  Hyperventilation and photic stimulation were performed.  The digital EEG was referentially recorded, reformatted, and digitally filtered in a variety of bipolar and referential montages for optimal display.    Description: The patient is awake and asleep during the recording.  During maximal wakefulness, there is a symmetric, medium voltage 8.5 Hz posterior dominant rhythm that attenuates with eye opening. There is independent focal slowing seen over the bilateral temporal regions, left greater than right, at times sharply contoured without clear epileptogenic potential. There are 2 instances of sharp transients seen over the left frontotemporal region with more epileptogenic potential with afteroing slow wave seen. During drowsiness and sleep, there is an increase in theta slowing of the background.  Vertex waves and symmetric sleep spindles were seen.  Hyperventilation and photic stimulation did not elicit any abnormalities.  There were no electrographic seizures seen.    EKG lead was unremarkable.  Impression: This awake and asleep EEG is abnormal due to the presence of: 1. Independent focal slowing seen over the bilateral temporal regions, left greater than right 2. Rare epileptiform discharges over the left frontotemporal region  Clinical Correlation of the above findings  indicates focal cerebral dysfunction over the bilateral temporal regions suggestive of underlying structural or physiologic abnormality. There is a possible tendency for seizures to arise from the left frontotemporal region. If further clinical questions remain, prolonged EEG may be helpful.  Clinical correlation is advised.   Ellouise Newer, M.D.

## 2016-08-04 ENCOUNTER — Encounter: Payer: Self-pay | Admitting: Internal Medicine

## 2016-08-05 ENCOUNTER — Telehealth: Payer: Self-pay | Admitting: Internal Medicine

## 2016-08-05 NOTE — Telephone Encounter (Signed)
Spoke with pt and scheduled her to see [facility] 08/06/16 @ 11am. Advised pt to not take Lisinopril tonight, as she reports very low BP readings on this medication. She voiced understanding. Nothing further needed.

## 2016-08-05 NOTE — Telephone Encounter (Signed)
Pt was told she needed to see someone asap, but has not heard anything yet. please advise.

## 2016-08-05 NOTE — Telephone Encounter (Signed)
Patient Name: Kaitlyn Flowers  DOB: 06/28/1941    Initial Comment Caller states, her dr. put her on bp rx. and now her bp is getting low 100/70 to 90/60 88/50 - the morning 2-3 hrs heart is racing and when it stops she exhausted. Low energy. Verified    Nurse Assessment  Nurse: Raphael Gibney, RN, Vanita Ingles Date/Time (Eastern Time): 08/05/2016 8:47:30 AM  Confirm and document reason for call. If symptomatic, describe symptoms. ---Caller states she started on antihypertensives about a month ago. Two weeks 112/66, 100/59; last week 83/50. When she gets up, her heart starts racing and last for 2-3 hrs. Heart is starting to race now. She has no energy. No dizziness.  Does the patient have any new or worsening symptoms? ---Yes  Will a triage be completed? ---Yes  Related visit to physician within the last 2 weeks? ---No  Does the PT have any chronic conditions? (i.e. diabetes, asthma, etc.) ---Yes  List chronic conditions. ---HTN  Is this a behavioral health or substance abuse call? ---No     Guidelines    Guideline Title Affirmed Question Affirmed Notes  Heart Rate and Heartbeat Questions Age > 60 years (Exception: brief heart beat symptoms that went away and now feels well)    Final Disposition User   See Physician within 4 Hours (or PCP triage) Raphael Gibney, RN, Vera    Comments  No appts available at [facility] within 4 hrs. Pt does not want to go to another office or ER or urgent care. She would like appt this afternoon. Please call pt back regarding appt.   Referrals  GO TO FACILITY REFUSED   Disagree/Comply: Disagree  Disagree/Comply Reason: Disagree with instructions

## 2016-08-06 ENCOUNTER — Encounter: Payer: Self-pay | Admitting: Adult Health

## 2016-08-06 ENCOUNTER — Ambulatory Visit (INDEPENDENT_AMBULATORY_CARE_PROVIDER_SITE_OTHER): Payer: Medicare Other | Admitting: Adult Health

## 2016-08-06 VITALS — BP 124/62 | HR 77 | Temp 98.2°F | Ht 61.0 in | Wt 101.5 lb

## 2016-08-06 DIAGNOSIS — R002 Palpitations: Secondary | ICD-10-CM | POA: Diagnosis not present

## 2016-08-06 DIAGNOSIS — I952 Hypotension due to drugs: Secondary | ICD-10-CM | POA: Diagnosis not present

## 2016-08-06 NOTE — Progress Notes (Signed)
Subjective:    Patient ID: Kaitlyn Flowers, female    DOB: Jan 03, 1941, 75 y.o.   MRN: [id number]  HPI  75 year old female who  has a past medical history of Anemia; Osteopenia; PONV (postoperative nausea and vomiting); Spinal cord tumor; and Wears hearing aid. She was started on lisinopril 5mg  by her PCP on October 20th for varying blood pressure readings. She was monitoring her blood pressure at home over the last two weeks, per her readings, a majority of her readings were below [id number] systolic with the lowest being 83/50.   She stopped taking the lisinopril less than 24 hours ago  She reports that every morning when she wakes up her heart feels like it is racing, this lasts for 2-3 hours and then resolves. She feels fatigued after. This has been persistent for the last 2-3 weeks   She denies any chest pain, syncope, dizziness or lightheadedness   Review of Systems  Constitutional: Negative.   Respiratory: Negative.   Cardiovascular: Positive for palpitations.  Gastrointestinal: Negative.   Musculoskeletal: Negative.   Neurological: Negative.   All other systems reviewed and are negative.  Past Medical History:  Diagnosis Date  . Anemia   . Osteopenia   . PONV (postoperative nausea and vomiting)   . 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  . Wears hearing aid     Social History   Social History  . Marital status: Legally Separated    Spouse name: N/A  . Number of children: N/A  . Years of education: N/A   Occupational History  . Not on file.   Social History Main Topics  . Smoking status: Former Smoker    Packs/day: 0.25    Years: 2.00    Types: Cigarettes    Quit date: 09/03/1967  . Smokeless tobacco: Never Used  . Alcohol use 2.4 oz/week    4 Glasses of wine per week  . Drug use: No  . Sexual activity: Not on file   Other Topics Concern  . 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 ABDOMINAL HYSTERECTOMY  1973   1/2 of ovary left cyst on ovary  . APPENDECTOMY     removed with hysterectomy  . LUMBAR WOUND DEBRIDEMENT  12/06/2011   Procedure: LUMBAR WOUND DEBRIDEMENT;  Surgeon: Kristeen Miss, MD;  Location: [facility];  Service: Neurosurgery;  Laterality: N/A;  Repair of Pseudomeningocele Lumbar wound  . ROTATOR CUFF REPAIR     left   . shoulder rotator culff   2006  . spinal cord tumor  1987   2012 october     Family History  Problem Relation Age of Onset  . Stroke Mother     2010  . Heart attack Father   . Anesthesia problems Neg Hx     Allergies  Allergen Reactions  . Ibuprofen-Famotidine Other (See Comments)    Elevated BP  . Penicillins Rash    Current Outpatient Prescriptions on File Prior to Visit  Medication Sig Dispense Refill  . aspirin EC 325 MG tablet Take 1 tablet (325 mg total) by mouth daily. 30 tablet 11  . b complex vitamins capsule Take 1 capsule by mouth daily.    . butalbital-acetaminophen-caffeine (FIORICET, Haliimaile) [phone]  MG tablet Take 1 tablet as needed twice a day for headache. Do not take more than 2-3 times a week 10 tablet 4  . CALCIUM PO Take 1,000 Units by mouth daily.     . Cholecalciferol (VITAMIN D3) 5000 UNITS CAPS Take 1 capsule by mouth daily.    Marland Kitchen lisinopril (PRINIVIL,ZESTRIL) 5 MG tablet Take 1 tablet (5 mg total) by mouth daily. 30 tablet 3  . LORazepam (ATIVAN) 1 MG tablet Take 1 tablet as needed for seizure. Do not take more than 2 tablets in a 24 hour period 10 tablet 4  . LYRICA 25 MG capsule Take 25 mg by mouth 2 (two) times daily.     Marland Kitchen MAGNESIUM PO Take 1,200 mg by mouth every evening.    . Meth-Hyo-M Bl-Na Phos-Ph Sal (URO-MP) 118 MG CAPS Take 118 mg by mouth 2 (two) times daily.  3  . Multiple Vitamin (MULTIVITAMIN WITH MINERALS) TABS tablet Take 1 tablet by mouth daily.    .  nitrofurantoin (MACRODANTIN) 100 MG capsule Take 100 mg by mouth at bedtime.  11  . nortriptyline (PAMELOR) 25 MG capsule TAKE ONE CAPSULE AT BEDTIME 30 capsule 2  . RAPAFLO 8 MG CAPS capsule Take 8 mg by mouth every evening.    . topiramate (TOPAMAX) 25 MG tablet Take 1 tablet at night for 1 week, then increase to 1 tablet twice a day 60 tablet 6   No current facility-administered medications on file prior to visit.     BP 124/62   Pulse 77   Temp 98.2 F (36.8 C) (Oral)   Ht 5\' 1"  (1.549 m)   Wt 101 lb 8 oz (46 kg)   BMI 19.18 kg/m       Objective:   Physical Exam  Constitutional: She is oriented to person, place, and time. She appears well-developed and well-nourished. No distress.  Eyes: Conjunctivae and EOM are normal. Pupils are equal, round, and reactive to light. Right eye exhibits no discharge.  Cardiovascular: Normal rate, regular rhythm, normal heart sounds and intact distal pulses.  Exam reveals no gallop and no friction rub.   No murmur heard. Pulmonary/Chest: Effort normal and breath sounds normal. No respiratory distress. She has no wheezes. She has no rales. She exhibits no tenderness.  Musculoskeletal: Normal range of motion. She exhibits no edema, tenderness or deformity.  Neurological: She is alert and oriented to person, place, and time.  Skin: Skin is warm and dry. No rash noted. She is not diaphoretic. No erythema. No pallor.  Psychiatric: She has a normal mood and affect. Her behavior is normal. Judgment and thought content normal.  Nursing note and vitals reviewed.     Assessment & Plan:  1. Hypotension due to drugs - Ok to d/c lisinopril and monitor blood pressure at home - Return precautions given   2. Heart palpitations - Likely due to hypotension.  - EKG 12-Lead- NSR, Rate 74 - Advised to follow up if this continues over the next 2-3 days. At that time consider holter monitor.  - She did not want to do labs at this time  Dorothyann Peng,  NP

## 2016-08-07 ENCOUNTER — Telehealth: Payer: Self-pay | Admitting: Internal Medicine

## 2016-08-07 ENCOUNTER — Ambulatory Visit (INDEPENDENT_AMBULATORY_CARE_PROVIDER_SITE_OTHER): Payer: Medicare Other | Admitting: Adult Health

## 2016-08-07 ENCOUNTER — Encounter: Payer: Self-pay | Admitting: Adult Health

## 2016-08-07 VITALS — BP 118/64 | Temp 98.3°F | Ht 61.0 in | Wt 104.9 lb

## 2016-08-07 DIAGNOSIS — I159 Secondary hypertension, unspecified: Secondary | ICD-10-CM

## 2016-08-07 MED ORDER — LISINOPRIL 2.5 MG PO TABS: 2.5000 mg | ORAL_TABLET | Freq: Every day | ORAL | 3 refills | Status: DC

## 2016-08-07 NOTE — Progress Notes (Signed)
Subjective:    Patient ID: Kaitlyn Flowers, female    DOB: February 02, 1941, 75 y.o.   MRN: [id number]  HPI  75 year old female who presents to the office today for for varying blood pressure. She was seen by this Probation officer for hypotension yesterday at which time lisinopril 5 mg was discontinued. Today in the office she reports that she was woken up early this morning with a headache, when she took her blood pressure is 146/77. As the day progressed her blood pressures came down to 119/72 and her headache resolved.  She denies any blurred vision or syncopal episodes  Review of Systems  Constitutional: Negative.   Respiratory: Negative.   Cardiovascular: Negative.   Neurological: Positive for headaches. Negative for dizziness, seizures, weakness and light-headedness.  All other systems reviewed and are negative.  Past Medical History:  Diagnosis Date  . Anemia   . Osteopenia   . PONV (postoperative nausea and vomiting)   . 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  . Wears hearing aid     Social History   Social History  . Marital status: Legally Separated    Spouse name: N/A  . Number of children: N/A  . Years of education: N/A   Occupational History  . Not on file.   Social History Main Topics  . Smoking status: Former Smoker    Packs/day: 0.25    Years: 2.00    Types: Cigarettes    Quit date: 09/03/1967  . Smokeless tobacco: Never Used  . Alcohol use 2.4 oz/week    4 Glasses of wine per week  . Drug use: No  . Sexual activity: Not on file   Other Topics Concern  . 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 ABDOMINAL HYSTERECTOMY  1973   1/2 of ovary left cyst on ovary  . APPENDECTOMY     removed with hysterectomy  . LUMBAR WOUND DEBRIDEMENT  12/06/2011   Procedure:  LUMBAR WOUND DEBRIDEMENT;  Surgeon: Kristeen Miss, MD;  Location: [facility];  Service: Neurosurgery;  Laterality: N/A;  Repair of Pseudomeningocele Lumbar wound  . ROTATOR CUFF REPAIR     left   . shoulder rotator culff   2006  . spinal cord tumor  1987   2012 october     Family History  Problem Relation Age of Onset  . Stroke Mother     2010  . Heart attack Father   . Anesthesia problems Neg Hx     Allergies  Allergen Reactions  . Ibuprofen-Famotidine Other (See Comments)    Elevated BP  . Penicillins Rash    Current Outpatient Prescriptions on File Prior to Visit  Medication Sig Dispense Refill  . aspirin EC 325 MG tablet Take 1 tablet (325 mg total) by mouth daily. 30 tablet 11  . b complex vitamins capsule Take 1 capsule by mouth daily.    . butalbital-acetaminophen-caffeine (FIORICET, ESGIC) 50-325-40 MG tablet Take 1 tablet as needed twice a day for headache. Do not take more than 2-3 times a week 10 tablet 4  . CALCIUM PO Take 1,000 Units by mouth daily.     . Cholecalciferol (VITAMIN D3) 5000 UNITS CAPS Take 1 capsule by mouth daily.    Marland Kitchen  lisinopril (PRINIVIL,ZESTRIL) 5 MG tablet Take 1 tablet (5 mg total) by mouth daily. 30 tablet 3  . LORazepam (ATIVAN) 1 MG tablet Take 1 tablet as needed for seizure. Do not take more than 2 tablets in a 24 hour period 10 tablet 4  . MAGNESIUM PO Take 1,200 mg by mouth every evening.    . Meth-Hyo-M Bl-Na Phos-Ph Sal (URO-MP) 118 MG CAPS Take 118 mg by mouth 2 (two) times daily.  3  . Multiple Vitamin (MULTIVITAMIN WITH MINERALS) TABS tablet Take 1 tablet by mouth daily.    . nitrofurantoin (MACRODANTIN) 100 MG capsule Take 100 mg by mouth at bedtime.  11  . RAPAFLO 8 MG CAPS capsule Take 8 mg by mouth every evening.     No current facility-administered medications on file prior to visit.     BP 118/64   Temp 98.3 F (36.8 C) (Oral)   Ht 5\' 1"  (1.549 m)   Wt 104 lb 14.4 oz (47.6 kg)   BMI 19.82 kg/m       Objective:    Physical Exam  Constitutional: She is oriented to person, place, and time. She appears well-developed and well-nourished. No distress.  Eyes: Conjunctivae and EOM are normal. Pupils are equal, round, and reactive to light. Right eye exhibits no discharge. Left eye exhibits no discharge. No scleral icterus.  Cardiovascular: Normal rate, regular rhythm, normal heart sounds and intact distal pulses.  Exam reveals no gallop and no friction rub.   No murmur heard. Pulmonary/Chest: Effort normal and breath sounds normal. No respiratory distress. She has no wheezes. She has no rales. She exhibits no tenderness.  Neurological: She is alert and oriented to person, place, and time.  Skin: Skin is warm and dry. No rash noted. She is not diaphoretic. No erythema. No pallor.  Psychiatric: She has a normal mood and affect. Her behavior is normal. Judgment and thought content normal.  Nursing note and vitals reviewed.     Assessment & Plan:  1. Secondary hypertension - lisinopril (ZESTRIL) 2.5 MG tablet; Take 1 tablet (2.5 mg total) by mouth at bedtime.  Dispense: 30 tablet; Refill: 3 - Return precautions reviewed with patient.   Dorothyann Peng, NP

## 2016-08-07 NOTE — Telephone Encounter (Signed)
Noted  

## 2016-08-07 NOTE — Telephone Encounter (Signed)
Patient Name: Kaitlyn Flowers  DOB: 1941-01-09    Initial Comment Caller states she was seen yesterday. BP was low. Her BP is back up 146/77. Has questions about taking medication.   Nurse Assessment  Nurse: Raphael Gibney, RN, Vanita Ingles Date/Time (Eastern Time): 08/07/2016 9:35:29 AM  Confirm and document reason for call. If symptomatic, describe symptoms. ---Caller states she was in the office yesterday. She was instructed to stop the blood pressure medication. She has a headache. Today her BP is 146/77. Has slight SOB which she has had.  Does the patient have any new or worsening symptoms? ---Yes  Will a triage be completed? ---Yes  Related visit to physician within the last 2 weeks? ---Yes  Does the PT have any chronic conditions? (i.e. diabetes, asthma, etc.) ---Yes  List chronic conditions. ---HTN  Is this a behavioral health or substance abuse call? ---No     Guidelines    Guideline Title Affirmed Question Affirmed Notes  High Blood Pressure Wants doctor to measure BP    Final Disposition User   See Physician within 24 Hours Killbuck, RN, Vera    Comments  triage outcome upgraded to see physiciain within 24 rhs as pt was in office yesterday. BP medication was stopped and BP is elevated today and pt has had a headache.  appt scheduled for 5 pm on 08/07/2016 with Sallee Provencal   Referrals  REFERRED TO PCP OFFICE   Disagree/Comply: Leta Baptist

## 2016-08-09 ENCOUNTER — Emergency Department (HOSPITAL_COMMUNITY): Payer: Medicare Other

## 2016-08-09 ENCOUNTER — Telehealth: Payer: Self-pay | Admitting: Internal Medicine

## 2016-08-09 ENCOUNTER — Emergency Department (HOSPITAL_COMMUNITY)
Admission: EM | Admit: 2016-08-09 | Discharge: 2016-08-09 | Disposition: A | Discharge status: Home / Self Care | Payer: Medicare Other | Attending: Emergency Medicine | Admitting: Emergency Medicine

## 2016-08-09 ENCOUNTER — Encounter (HOSPITAL_COMMUNITY): Payer: Self-pay

## 2016-08-09 DIAGNOSIS — R0989 Other specified symptoms and signs involving the circulatory and respiratory systems: Secondary | ICD-10-CM | POA: Insufficient documentation

## 2016-08-09 DIAGNOSIS — Z79899 Other long term (current) drug therapy: Secondary | ICD-10-CM | POA: Insufficient documentation

## 2016-08-09 DIAGNOSIS — Z7982 Long term (current) use of aspirin: Secondary | ICD-10-CM | POA: Insufficient documentation

## 2016-08-09 DIAGNOSIS — Z87891 Personal history of nicotine dependence: Secondary | ICD-10-CM | POA: Insufficient documentation

## 2016-08-09 DIAGNOSIS — R0602 Shortness of breath: Secondary | ICD-10-CM | POA: Diagnosis present

## 2016-08-09 HISTORY — DX: Unspecified convulsions: R56.9

## 2016-08-09 LAB — CBC WITH DIFFERENTIAL/PLATELET
Basophils Absolute: 0 10*3/uL (ref 0.0–0.1)
Basophils Relative: 1 %
Eosinophils Absolute: 0 10*3/uL (ref 0.0–0.7)
Eosinophils Relative: 1 %
HCT: 29.1 % — ABNORMAL LOW (ref 36.0–46.0)
Hemoglobin: 10 g/dL — ABNORMAL LOW (ref 12.0–15.0)
Lymphocytes Relative: 34 %
Lymphs Abs: 1.8 10*3/uL (ref 0.7–4.0)
MCH: 32.9 pg (ref 26.0–34.0)
MCHC: 34.4 g/dL (ref 30.0–36.0)
MCV: 95.7 fL (ref 78.0–100.0)
Monocytes Absolute: 0.4 10*3/uL (ref 0.1–1.0)
Monocytes Relative: 7 %
Neutro Abs: 3 10*3/uL (ref 1.7–7.7)
Neutrophils Relative %: 57 %
Platelets: 221 10*3/uL (ref 150–400)
RBC: 3.04 MIL/uL — ABNORMAL LOW (ref 3.87–5.11)
RDW: 13.6 % (ref 11.5–15.5)
WBC: 5.2 10*3/uL (ref 4.0–10.5)

## 2016-08-09 LAB — BASIC METABOLIC PANEL
Anion gap: 9 (ref 5–15)
BUN: 14 mg/dL (ref 6–20)
CO2: 24 mmol/L (ref 22–32)
Calcium: 9.3 mg/dL (ref 8.9–10.3)
Chloride: 96 mmol/L — ABNORMAL LOW (ref 101–111)
Creatinine, Ser: 0.67 mg/dL (ref 0.44–1.00)
GFR calc Af Amer: >60 mL/min (ref >60)
GFR calc non Af Amer: >60 mL/min (ref >60)
Glucose, Bld: 111 mg/dL — ABNORMAL HIGH (ref 65–99)
Potassium: 4.5 mmol/L (ref 3.5–5.1)
Sodium: 129 mmol/L — ABNORMAL LOW (ref 135–145)

## 2016-08-09 LAB — BRAIN NATRIURETIC PEPTIDE: B Natriuretic Peptide: 98.9 pg/mL (ref 0.0–100.0)

## 2016-08-09 LAB — TROPONIN I: Troponin I: <0.03 ng/mL (ref <0.03)

## 2016-08-09 NOTE — ED Notes (Signed)
MD Little at the bedside  

## 2016-08-09 NOTE — Discharge Instructions (Signed)
Discontinue lisinopril until you contact your primary care doctor for further instructions. Return immediately if you have any chest pain, severe shortness of breath, sudden onset of headache, vision changes, or near passing out episodes.

## 2016-08-09 NOTE — ED Triage Notes (Signed)
Per Pt, Pt is coming from home with complaints of SOB and Hypotension due to medication, Lisinopril. Placed on medication for HTN. During original treatment, pt was noted to have Hypotension so MD took patient off of Lisinopril. HTN returned and patient was placed on a low dose of Lisinopril. Pt reports SOB today and called MD. MD advise patient come to the ED. Denies any lightheadedness or dizziness, N/V/D.

## 2016-08-09 NOTE — ED Notes (Signed)
Pt returned from [place]. Hannie, EMT to help place her back on the monitor

## 2016-08-09 NOTE — ED Notes (Signed)
Pt taken to Xray.

## 2016-08-09 NOTE — Telephone Encounter (Signed)
Pt has checked in to [facility] ED.  

## 2016-08-09 NOTE — Telephone Encounter (Signed)
[facility] - Client [facility] Patient Name: [name] DOB: [birth year] Initial Comment Francesca Jewett is having trouble with blood pressure. It is really low and is having heart pounding again. Current bp is 97/63. Nurse Assessment Nurse: Markus Daft, RN, Sherre Poot Date/Time (Eastern Time): 08/09/2016 9:55:15 AM Confirm and document reason for call. If symptomatic, describe symptoms. ---Caller states that she low BP at 97/63 and having heart pounding again with HR of 102. --She was on Lisinopril 2.5 mg one daily, and tried to go off of it. She tried to stay off of it for 48 hrs, and BP elevated so needed it again. Seen by MD twice this week. Given smaller dose of 2.5 and last night she took 1/2 of this when last night she was short of breath, and heart was racing. BP 98/57, HR 97. Dizzy earlier in the day with walking. Does the patient have any new or worsening symptoms? ---Yes Will a triage be completed? ---Yes Related visit to physician within the last 2 weeks? ---Yes Does the PT have any chronic conditions? (i.e. diabetes, asthma, etc.) ---Yes List chronic conditions. ---HTN, seizures Is this a behavioral health or substance abuse call? ---No Guidelines Guideline Title Affirmed Question Affirmed Notes Heart Rate and Heartbeat Questions New or worsened shortness of breath with activity (dyspnea on exertion) Final Disposition User Go to ED Now (or PCP triage) Markus Daft, RN, Windy Comments Feels heart beating fast now. RN advised that we check her BP/ pulse again now. Results = 118/69, HR 83 Spoke with Erica and confirmed that no available appts within the next hr. Referrals [facility] - ED Disagree/Comply: Comply

## 2016-08-09 NOTE — ED Provider Notes (Signed)
[facility] DEPT Provider Note   CSN: [id number] Arrival date & time: 08/09/16  1107     History   Chief Complaint Chief Complaint  Patient presents with  . Shortness of Breath  . Hypotension    HPI Kaitlyn Flowers is a 75 y.o. female.  75yo F w/ HTN who p/w blood pressure problems. Recently the patient was having problems with elevated blood pressure and her primary care provider started her on lisinopril. She then began having problems with her blood pressure dropping too low associated with decreased energy and episodes of tachycardia. Her PCP stopped her medication and then one week ago she began having elevated blood pressure and headaches so he started her back on it at a smaller dose. She then began having episodes of low blood pressure again. Last night she took half of a 2.5 mg dose. Because she was not able to follow up with her primary care doctor today, she presented due to concerns for her blood pressure fluctuation. She reports a one to 2 day history of shortness of breath on exertion. No associated chest pain, nausea, vomiting, or diaphoresis. No fever, cough/cold symptoms, or recent illness. No other new medications. She denies any symptoms currently.    The history is provided by the patient.    Past Medical History:  Diagnosis Date  . Anemia   . Osteopenia   . PONV (postoperative nausea and vomiting)   . Seizures ([place])   . 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  . Wears hearing aid     Patient Active Problem List   Diagnosis Date Noted  . Localization-related idiopathic epilepsy and epileptic syndromes with seizures of localized onset, not intractable, without status epilepticus ([place]) 08/01/2016  . Hyponatremia 04/01/2016  . Aphasia   . Abdominal cramping 01/22/2016  . Nocturnal leg cramps 12/07/2015  . UTI (lower urinary tract infection) 10/02/2015  . Spinal stenosis, lumbar region, 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    . Sacroiliitis ([place]) 06/06/2014  . Lumbar radicular pain 06/06/2014  . Right low back pain 01/07/2014  . Anemia 01/07/2014  . Osteoporosis 12/26/2013  . Glucosuria 12/16/2012  . Tick bite of upper arm 12/16/2012  . UTI (urinary tract infection) 12/16/2012  . Back pain with radiation 06/12/2012  . Plantar wart of right foot 12/29/2011  . 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/11/2011  . Fatigue 11/11/2011  . 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 Urinary frequency 09/02/2011  . Dysuria 09/02/2011  . Medicare annual wellness visit, initial 12/18/2010  . Wears hearing aid 12/18/2010  . Medicare welcome visit 12/18/2010  . Back pain 11/23/2010  . Spinal cord tumor   . ABDOMINAL BRUIT 07/21/2009  . NEOPLASM, SKIN, UNCERTAIN BEHAVIOR [id number]  . HYPERKALEMIA 05/12/2007  . ANEMIA NOS 05/12/2007  . DISORDER, STOMACH FUNCTION NEC 04/07/2007    Past Surgical History:  Procedure Laterality Date  . ABDOMINAL HYSTERECTOMY  1973   1/2 of ovary left cyst on ovary  . APPENDECTOMY     removed with hysterectomy  . LUMBAR WOUND DEBRIDEMENT  12/06/2011   Procedure: LUMBAR WOUND DEBRIDEMENT;  Surgeon: Kristeen Miss, MD;  Location: [facility];  Service: Neurosurgery;  Laterality: N/A;  Repair of Pseudomeningocele Lumbar wound  . ROTATOR CUFF REPAIR     left   . shoulder rotator culff   2006  . spinal cord tumor  1987   2012 october     OB History    No data available  Home Medications    Prior to Admission medications   Medication Sig Start Date End Date Taking? Authorizing Provider  aspirin EC 325 MG tablet Take 1 tablet (325 mg total) by mouth daily. 07/10/16  Yes Cameron Sprang, MD  b complex vitamins capsule Take 1 capsule by mouth daily.   Yes Historical Provider, MD  butalbital-acetaminophen-caffeine (FIORICET, ESGIC) 50-325-40 MG tablet Take 1 tablet as needed twice a day for headache. Do not take more than 2-3 times a week 07/12/16  Yes Cameron Sprang, MD   CALCIUM PO Take 1,000 Units by mouth daily.    Yes Historical Provider, MD  Cholecalciferol (VITAMIN D3) 5000 UNITS CAPS Take 1 capsule by mouth daily.   Yes Historical Provider, MD  lisinopril (ZESTRIL) 2.5 MG tablet Take 1 tablet (2.5 mg total) by mouth at bedtime. 08/07/16  Yes Dorothyann Peng, NP  LORazepam (ATIVAN) 1 MG tablet Take 1 tablet as needed for seizure. Do not take more than 2 tablets in a 24 hour period 07/12/16  Yes Cameron Sprang, MD  MAGNESIUM PO Take 1,200 mg by mouth every evening.   Yes Historical Provider, MD  Meth-Hyo-M Bl-Na Phos-Ph Sal (URO-MP) 118 MG CAPS Take 118 mg by mouth daily as needed. UTI symptoms 01/11/16  Yes Historical Provider, MD  Multiple Vitamin (MULTIVITAMIN WITH MINERALS) TABS tablet Take 1 tablet by mouth daily.   Yes Historical Provider, MD  nitrofurantoin (MACRODANTIN) 100 MG capsule Take 100 mg by mouth at bedtime. 05/27/16  Yes Historical Provider, MD  RAPAFLO 8 MG CAPS capsule Take 8 mg by mouth every evening. 03/25/16  Yes Historical Provider, MD    Family History Family History  Problem Relation Age of Onset  . Stroke Mother     2010  . Heart attack Father   . Anesthesia problems Neg Hx     Social History Social History  Substance Use Topics  . Smoking status: Former Smoker    Packs/day: 0.25    Years: 2.00    Types: Cigarettes    Quit date: 09/03/1967  . Smokeless tobacco: Never Used  . Alcohol use 2.4 oz/week    4 Glasses of wine per week     Allergies   Ibuprofen-famotidine and Penicillins   Review of Systems Review of Systems   Physical Exam Updated Vital Signs BP 119/60   Pulse 93   Temp 97.9 F (36.6 C) (Oral)   Resp 12   Ht 5\' 1"  (1.549 m)   Wt 100 lb (45.4 kg)   SpO2 100%   BMI 18.89 kg/m   Physical Exam  Constitutional: She is oriented to person, place, and time. She appears well-developed and well-nourished. No distress.  HENT:  Head: Normocephalic and atraumatic.  Moist mucous membranes  Eyes:  Conjunctivae are normal. Pupils are equal, round, and reactive to light.  Neck: Neck supple.  Cardiovascular: Normal rate, regular rhythm and normal heart sounds.   No murmur heard. Pulmonary/Chest: Effort normal and breath sounds normal.  Abdominal: Soft. Bowel sounds are normal. She exhibits no distension. There is no tenderness.  Musculoskeletal: She exhibits no edema.  Neurological: She is alert and oriented to person, place, and time.  Fluent speech  Skin: Skin is warm and dry.  Psychiatric: She has a normal mood and affect. Judgment normal.  Nursing note and vitals reviewed.    ED Treatments / Results  Labs (all labs ordered are listed, but only abnormal results are displayed) Labs Reviewed  BASIC METABOLIC PANEL -  Abnormal; Notable for the following:       Result Value   Sodium 129 (*)    Chloride 96 (*)    Glucose, Bld 111 (*)    All other components within normal limits  CBC WITH DIFFERENTIAL/PLATELET - Abnormal; Notable for the following:    RBC 3.04 (*)    Hemoglobin 10.0 (*)    HCT 29.1 (*)    All other components within normal limits  TROPONIN I  BRAIN NATRIURETIC PEPTIDE    EKG  EKG Interpretation  Date/Time:  Friday August 09 2016 12:50:28 EST Ventricular Rate:  85 PR Interval:    QRS Duration: 91 QT Interval:  354 QTC Calculation: 421 R Axis:   48 Text Interpretation:  Sinus rhythm RSR' in V1 or V2, probably normal variant No significant change since last tracing Confirmed by Sava Proby MD, Onell Mcmath [id number]) on 08/09/2016 4:11:48 PM       Radiology Dg Chest 2 View  Result Date: 08/09/2016 CLINICAL DATA:  Shortness of breath with exertion. EXAM: CHEST  2 VIEW COMPARISON:  08/23/2012 FINDINGS: The heart size and mediastinal contours are within normal limits. Both lungs are clear except for small calcified granuloma in the left midzone. No acute bone abnormality. IMPRESSION: No active cardiopulmonary disease. Electronically Signed   By: Lorriane Shire M.D.    On: 08/09/2016 13:23    Procedures Procedures (including critical care time)  Medications Ordered in ED Medications - No data to display   Initial Impression / Assessment and Plan / ED Course  I have reviewed the triage vital signs and the nursing notes.  Pertinent labs & imaging results that were available during my care of the patient were reviewed by me and considered in my medical decision making (see chart for details).  Clinical Course    PT w/ recent problems of fluctuating BP related to lisinopril use p/w concerns for low BP in setting of 2.5mg  daily dose. She was well-appearing with normal vital signs on exam, BP 113/65. She did complain of some shortness of breath on exertion that she noted when she tried to work out yesterday and she states that the shortness of breath is only exertional and has only occurred related to lisinopril use. Obtained above lab work as well as chest x-ray and EKG.  EKG reassuring, chest x-ray unremarkable. Sodium 129 which is similar to previous. Patient has mild anemia with hemoglobin of 10 which is similar to previous. Recommended iron supplementation. Given that the patient's symptoms seem to be directly related to when she is taking lisinopril, I did recommend discontinuing lisinopril until she follows up with her PCP. I discussed permissive hypertension but also reviewed return precautions. Patient will contact her PCP in 2 days for follow-up. Patient ambulatory and asymptomatic at time of discharge and discharged in satisfactory condition.  Final Clinical Impressions(s) / ED Diagnoses   Final diagnoses:  Labile blood pressure    New Prescriptions Discharge Medication List as of 08/09/2016  4:27 PM       Sharlett Iles, MD 08/09/16 1704

## 2016-08-14 ENCOUNTER — Ambulatory Visit: Payer: Medicare Other | Admitting: Adult Health

## 2016-08-16 ENCOUNTER — Ambulatory Visit (INDEPENDENT_AMBULATORY_CARE_PROVIDER_SITE_OTHER): Payer: Medicare Other | Admitting: Adult Health

## 2016-08-16 ENCOUNTER — Encounter: Payer: Self-pay | Admitting: Adult Health

## 2016-08-16 VITALS — BP 126/64 | Temp 97.9°F | Ht 61.0 in | Wt 105.1 lb

## 2016-08-16 DIAGNOSIS — I998 Other disorder of circulatory system: Secondary | ICD-10-CM | POA: Diagnosis not present

## 2016-08-16 NOTE — Progress Notes (Signed)
Subjective:    Patient ID: Kaitlyn Flowers, female    DOB: 04-Sep-1940, 75 y.o.   MRN: [id number]  HPI  75 year old female who presents to the office today for follow up after being seen in the ER on 08/09/2016 for liable blood pressure. I have seen her one week previous for this issue and started her on a low dose of lisinopril 2.5 mg after taking her off 5 mg of lisinopril due to hypotension and tachycardia.   She reported to the ER for blood pressure fluctuations with shortness of breath on exertion.   EKG, labs, and chest xray were unremarkable.   Today she reports that she has not taken any lisinopril since being discharge and is monitoring her blood pressure, which has stayed stable. She is not having any shortness of breath  Review of Systems  Constitutional: Negative.   Respiratory: Negative.   Cardiovascular: Negative.   Neurological: Negative.   All other systems reviewed and are negative.  Past Medical History:  Diagnosis Date  . Anemia   . Osteopenia   . PONV (postoperative nausea and vomiting)   . Seizures ([place])   . 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  . Wears hearing aid     Social History   Social History  . Marital status: Legally Separated    Spouse name: N/A  . Number of children: N/A  . Years of education: N/A   Occupational History  . Not on file.   Social History Main Topics  . Smoking status: Former Smoker    Packs/day: 0.25    Years: 2.00    Types: Cigarettes    Quit date: 09/03/1967  . Smokeless tobacco: Never Used  . Alcohol use 2.4 oz/week    4 Glasses of wine per week  . Drug use: No  . Sexual activity: Not on file   Other Topics Concern  . 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 ABDOMINAL HYSTERECTOMY  1973   1/2 of ovary  left cyst on ovary  . APPENDECTOMY     removed with hysterectomy  . LUMBAR WOUND DEBRIDEMENT  12/06/2011   Procedure: LUMBAR WOUND DEBRIDEMENT;  Surgeon: Kristeen Miss, MD;  Location: [facility];  Service: Neurosurgery;  Laterality: N/A;  Repair of Pseudomeningocele Lumbar wound  . ROTATOR CUFF REPAIR     left   . shoulder rotator culff   2006  . spinal cord tumor  1987   2012 october     Family History  Problem Relation Age of Onset  . Stroke Mother     2010  . Heart attack Father   . Anesthesia problems Neg Hx     Allergies  Allergen Reactions  . Ibuprofen-Famotidine Other (See Comments)    Elevated BP  . Penicillins Rash    Current Outpatient Prescriptions on File Prior to Visit  Medication Sig Dispense Refill  . aspirin EC 325 MG tablet Take 1 tablet (325 mg total) by mouth daily. 30 tablet 11  . b complex vitamins capsule Take 1 capsule by mouth daily.    . butalbital-acetaminophen-caffeine (FIORICET, ESGIC) 50-325-40 MG tablet Take 1 tablet as needed twice a day for headache. Do not take more than 2-3 times a  week 10 tablet 4  . CALCIUM PO Take 1,000 Units by mouth daily.     . Cholecalciferol (VITAMIN D3) 5000 UNITS CAPS Take 1 capsule by mouth daily.    Marland Kitchen LORazepam (ATIVAN) 1 MG tablet Take 1 tablet as needed for seizure. Do not take more than 2 tablets in a 24 hour period 10 tablet 4  . MAGNESIUM PO Take 1,200 mg by mouth every evening.    . Meth-Hyo-M Bl-Na Phos-Ph Sal (URO-MP) 118 MG CAPS Take 118 mg by mouth daily as needed. UTI symptoms  3  . Multiple Vitamin (MULTIVITAMIN WITH MINERALS) TABS tablet Take 1 tablet by mouth daily.    . nitrofurantoin (MACRODANTIN) 100 MG capsule Take 100 mg by mouth at bedtime.  11  . RAPAFLO 8 MG CAPS capsule Take 8 mg by mouth every evening.    Marland Kitchen lisinopril (ZESTRIL) 2.5 MG tablet Take 1 tablet (2.5 mg total) by mouth at bedtime. (Patient not taking: Reported on 08/16/2016) 30 tablet 3   No current facility-administered  medications on file prior to visit.     BP 126/64   Temp 97.9 F (36.6 C) (Oral)   Ht 5\' 1"  (1.549 m)   Wt 105 lb 1.6 oz (47.7 kg)   BMI 19.86 kg/m       Objective:   Physical Exam  Constitutional: She is oriented to person, place, and time. She appears well-developed and well-nourished. No distress.  Cardiovascular: Normal rate, regular rhythm, normal heart sounds and intact distal pulses.  Exam reveals no gallop and no friction rub.   No murmur heard. Pulmonary/Chest: Effort normal and breath sounds normal. No respiratory distress. She has no wheezes. She has no rales. She exhibits no tenderness.  Neurological: She is alert and oriented to person, place, and time.  Skin: Skin is warm and dry. No rash noted. She is not diaphoretic. No erythema. No pallor.  Psychiatric: She has a normal mood and affect. Her behavior is normal. Judgment and thought content normal.  Nursing note and vitals reviewed.     Assessment & Plan:  1. Fluctuating blood pressure - Stay off lisinopril  - Continue to monitor  - Return precautions given  - Consider switching class of antihypertension medication   Dorothyann Peng, NP

## 2016-09-03 NOTE — Progress Notes (Signed)
Pre visit review using our clinic review tool, if applicable. No additional management support is needed unless otherwise documented below in the visit note.  Chief Complaint  Patient presents with  . Flucuating BP Readings    HPI: Kaitlyn Flowers 76 y.o.  comesin f or sda  for ongoing concerns about her blood pressure going up and down. She is seeing Ronna Polio a couple times and been to the emergency room around her blood pressure. Seen in ed 12 8 ans was ok  Not on medication  Has seen  nuero poss epilepsy. In regard to headaches. Better   Has 3 things  :concering today    Right  si pain and pulling  .   Pain getting much worse and now has a large crevice in the area  asymmetrical  Pain is getting bad   What to do? Last check with [facility] over a year ago and  imagibnd not explaing sx  Now more severe  Has wart   foot .   Right 3 weeks  Adding pads and topicals   Bp still fluctuating gets  Inc hr and energy up and down  And ongoing . No syncope had echo in past .   No seizure   No med had se .  No new meds one etoh per day dec caffiene.   ROS: See pertinent positives and negatives per HPI.  Past Medical History:  Diagnosis Date  . Anemia   . Osteopenia   . PONV (postoperative nausea and vomiting)   . Seizures ([place])   . 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  . Wears hearing aid     Family History  Problem Relation Age of Onset  . Stroke Mother     2010  . Heart attack Father   . Anesthesia problems Neg Hx     Social History   Social History  . Marital status: Legally Separated    Spouse name: N/A  . Number of children: N/A  . Years of education: N/A   Social History Main Topics  . Smoking status: Former Smoker    Packs/day: 0.25    Years: 2.00    Types: Cigarettes    Quit date: 09/03/1967  . Smokeless tobacco: Never Used  . Alcohol use 2.4 oz/week    4 Glasses of wine per week  . Drug use: No  . Sexual activity: Not Asked     Other Topics Concern  . 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 aspirin EC 325 MG tablet Take 1 tablet (325 mg total) by mouth daily. 30 tablet 11  . b complex vitamins capsule Take 1 capsule by mouth daily.    . butalbital-acetaminophen-caffeine (FIORICET, ESGIC) 50-325-40 MG tablet Take 1 tablet as needed twice a day for headache. Do not take more than 2-3 times a week 10 tablet 4  . CALCIUM PO Take 1,000 Units by mouth daily.     . Cholecalciferol (VITAMIN D3) 5000 UNITS CAPS Take 1 capsule by mouth daily.    Marland Kitchen LORazepam (ATIVAN) 1 MG tablet Take 1 tablet as needed for seizure. Do not take more than 2 tablets in a 24 hour period 10 tablet 4  . MAGNESIUM PO Take  1,200 mg by mouth every evening.    . Meth-Hyo-M Bl-Na Phos-Ph Sal (URO-MP) 118 MG CAPS Take 118 mg by mouth daily as needed. UTI symptoms  3  . Multiple Vitamin (MULTIVITAMIN WITH MINERALS) TABS tablet Take 1 tablet by mouth daily.    . nitrofurantoin (MACRODANTIN) 100 MG capsule Take 100 mg by mouth at bedtime.  11  . RAPAFLO 8 MG CAPS capsule Take 8 mg by mouth every evening.    Marland Kitchen lisinopril (ZESTRIL) 2.5 MG tablet Take 1 tablet (2.5 mg total) by mouth at bedtime. (Patient not taking: Reported on 08/16/2016) 30 tablet 3   No facility-administered medications prior to visit.      EXAM:  BP 136/60 (BP Location: Right Arm, Patient Position: Sitting, Cuff Size: Normal)   Temp 97.7 F (36.5 C) (Oral)   Wt 103 lb 14.4 oz (47.1 kg)   BMI 19.63 kg/m   Body mass index is 19.63 kg/m.  GENERAL: vitals reviewed and listed above, alert, oriented, appears well hydrated and in no acute distress HEENT: atraumatic, conjunctiva  clear, no obvious abnormalities on inspection of external nose and ears   NECK: no obvious masses on inspection  palpation  LUNGS: clear to auscultation bilaterally, no wheezes, rales or rhonchi,  CV: HRRR, no clubbing cyanosis or  peripheral edema nl cap refill  Back  Sever atrophic area  In right si region asymmetrical  MS: moves all extremities without noticeable focal  Abnormality Ski right foot  plantar surface  Corn like lesion noted  Calloused no ulcer  Rest of foot clear  PSYCH: pleasant and cooperative, no obvious depression or anxiety BP Readings from Last 3 Encounters:  09/05/16 136/60  08/16/16 126/64  08/09/16 119/60      ASSESSMENT AND PLAN:  Discussed the following assessment and plan:  Fluctuating blood pressure - Plan: Ambulatory referral to Cardiology  Palpitations - ? episodic inc HR?  uncertain cause see test in context of fluctuating bp and sx - Plan: Ambulatory referral to Cardiology  SI (sacroiliac) pain - getting severe now  and   overlying atrophy?  or the area  ? vs other opinion dr elsner other have neuro check  - Plan: Ambulatory referral to Neurosurgery  Corn of foot  Deformity - right buttocks si area  - Plan: Ambulatory referral to Neurosurgery   -Patient advised to return or notify health care team  if symptoms worsen ,persist or new concerns arise. Increasing pain right SI area looks like major atrophy vs ms disruption  of the area but no history of steroid injections or other manipulations that would explain this. Have Dr. Ellene Route the neurosurgeon look at this area and her pain for any other advice. Also have Dr. Delice Lesch look at this area. The meantime we can try Lidoderm patches although Medicare may not pay for this. consideration of seeing dr Tamala Julian for US  Imaging   Get cards consult ? If event monitor is in order vs 24 hours bp readings  Local care corn  Vs see podiatry .   Patient Instructions  Continue working   On [employer] on the foot .  Soak pads and paring .   Call   Dr Ellene Route office and we wil also refer about the   Skin changes over the   Area  of pain .   Getting worse  Over the si joint .   Area   We will also do a new referral .   Will do a  cards referral for the bp and   Increase heart rate . And fluctuating BP  readings .  Will send in lidderm patch to see if helps pain in the interim          Mariann Laster K. Katrinka Herbison M.D.

## 2016-09-05 ENCOUNTER — Encounter: Payer: Self-pay | Admitting: Internal Medicine

## 2016-09-05 ENCOUNTER — Ambulatory Visit (INDEPENDENT_AMBULATORY_CARE_PROVIDER_SITE_OTHER): Payer: Medicare Other | Admitting: Internal Medicine

## 2016-09-05 VITALS — BP 136/60 | Temp 97.7°F | Wt 103.9 lb

## 2016-09-05 DIAGNOSIS — Q899 Congenital malformation, unspecified: Secondary | ICD-10-CM

## 2016-09-05 DIAGNOSIS — M533 Sacrococcygeal disorders, not elsewhere classified: Secondary | ICD-10-CM | POA: Diagnosis not present

## 2016-09-05 DIAGNOSIS — I998 Other disorder of circulatory system: Secondary | ICD-10-CM

## 2016-09-05 DIAGNOSIS — R002 Palpitations: Secondary | ICD-10-CM | POA: Diagnosis not present

## 2016-09-05 DIAGNOSIS — L84 Corns and callosities: Secondary | ICD-10-CM | POA: Diagnosis not present

## 2016-09-05 MED ORDER — LIDOCAINE 5 % EX PTCH: 1.0000 | MEDICATED_PATCH | CUTANEOUS | 0 refills | Status: DC

## 2016-09-05 NOTE — Patient Instructions (Addendum)
Continue working   On [employer] on the foot .  Soak pads and paring .   Call   Dr Ellene Route office and we wil also refer about the   Skin changes over the   Area of pain .   Getting worse  Over the si joint .   Area   We will also do a new referral .   Will do a cards referral for the bp and   Increase heart rate . And fluctuating BP  readings .  Will send in lidderm patch to see if helps pain in the interim

## 2016-09-09 ENCOUNTER — Ambulatory Visit (INDEPENDENT_AMBULATORY_CARE_PROVIDER_SITE_OTHER): Payer: Medicare Other | Admitting: Neurology

## 2016-09-09 ENCOUNTER — Telehealth: Payer: Self-pay | Admitting: Family Medicine

## 2016-09-09 ENCOUNTER — Encounter: Payer: Self-pay | Admitting: Neurology

## 2016-09-09 VITALS — BP 132/64 | HR 58 | Ht 61.0 in | Wt 105.2 lb

## 2016-09-09 DIAGNOSIS — M5440 Lumbago with sciatica, unspecified side: Secondary | ICD-10-CM | POA: Diagnosis not present

## 2016-09-09 DIAGNOSIS — M6258 Muscle wasting and atrophy, not elsewhere classified, other site: Secondary | ICD-10-CM

## 2016-09-09 DIAGNOSIS — G40009 Localization-related (focal) (partial) idiopathic epilepsy and epileptic syndromes with seizures of localized onset, not intractable, without status epilepticus: Secondary | ICD-10-CM | POA: Diagnosis not present

## 2016-09-09 DIAGNOSIS — D321 Benign neoplasm of spinal meninges: Secondary | ICD-10-CM

## 2016-09-09 MED ORDER — LAMOTRIGINE 50 MG PO TBDP: ORAL_TABLET | ORAL | 2 refills | Status: DC

## 2016-09-09 MED ORDER — BACLOFEN 10 MG PO TABS: ORAL_TABLET | ORAL | 4 refills | Status: DC

## 2016-09-09 NOTE — Progress Notes (Signed)
[name] [address] [place], [place] [address] Phone: [phone] Subjective:    CC: Acute low back pain followup.  HER:[id number]  Kaitlyn Flowers is a 76 y.o. female coming in with complaint of low back pain.patient does have radicular symptoms.  Patient has had this pain for quite some time. Patient has had significant workup for this. Patient did have some mild spinal stenosis that seemed to be progressing from previous MRI. Patient elected try an epidural. Did not have any significant relief. Was on prednisone that did help somewhat. Has had a positive ANA but negative titers. History of the removal of the spinal cord tumor.  Patient was previously seen over a year ago and was given sacroiliac injection. This is 07/10/2015. This last time we saw patient. Patient did not have any significant improvement with that injection either. Patient states Worsening symptoms at this time. Also sees a massage therapist in a regular basis and has noticed that she has had pain in didn't on her right side. Patient states that now pressure in this area seems to be worsening. States that pain is unrelenting at this moment. States it is affecting all daily activities. Feels that she is not getting significant amount of improvement with any other physicians she has been seen.   Patient is scheduled to have MRI of the thoracic and lumbar region. This is ordered by the neurologist. With past medical history of the spinal meningioma we will monitor.  More past medical history Patient at last visit did have labs drawn and did have a positive in ANA but a negative titer. Patient did have a minor elevation an ESR and a minor elevation and parathyroid hormone. Patient's calcium is borderline high. Please see labs for further information. Past medical history is also significant for spinal meningioma.  X-rays were reviewed by me a previously and patient does have new degenerative  changes at L3-L4, L4-L5, and L5-S1. Patient does have some foraminal narrowing on the right L5-S1. MRI on 10/16/2015showed multiple levels of foraminal narrowing especially at the L4-L5 on the left side and L5-S1 on the left side as well. Moderate spinal stenosis noted. Patient had a epidural steroid injection 06/27/2014 with no significant improvement.     Past medical history, social, surgical and family history all reviewed in electronic medical record.   Review of systems is positive for muscle aches and pains, mild weight loss, intermittent abdominal pain, headaches.  Objective  Blood pressure 118/60, pulse 89, height '5\' 1"'  (1.549 m), weight 103 lb (46.7 kg), SpO2 94 %.  General: No apparent distress alert and oriented x3 mood and affect normal, dressed appropriately.  HEENT: Pupils equal, extraocular movements intact  Respiratory: Patient's speak in full sentences and does not appear short of breath  Cardiovascular: No lower extremity edema, non tender, no erythema  Skin: Warm dry intact with no signs of infection or rash on extremities or on axial skeleton.  Abdomen: Soft nontender  Neuro: Cranial nerves II through XII are intact, neurovascularly intact in all extremities with 2+ DTRs and 2+ pulses.  Lymph: No lymphadenopathy of posterior or anterior cervical chain or axillae bilaterally.  Gait normal with good balance and coordination.  MSK:  Non tender with full range of motion and good stability and symmetric strength and tone of shoulders, elbows, wrist, hip, knee and ankles bilaterally.  Back Exam:  Inspection: She does have fat atrophy around the right sacroiliac joint Motion: Flexion 35 deg, Extension 25 deg,  Side Bending to 35 deg bilaterally,  Rotation to 35 deg bilaterally  SLR laying: Negative  XSLR laying: Negative  Palpable tenderness: Severe tenderness over the right sacroiliac joint. Increasing discomfort of the paraspinal musculature of the lumbar spine as well.  FABER: Positive right still present worsening previous exam. Sensory change: Gross sensation intact to all lumbar and sacral dermatomes.  Reflexes: 2+ at both patellar tendons, 2+ at achilles tendons, Babinski's downgoing.  Strength at foot  Plantar-flexion: 5/5 Dorsi-flexion: 5/5 Eversion: 5/5 Inversion: 5/5  Leg strength  Quad: 5/5 Hamstring: 5/5 Hip flexor: 5/5 Hip abductors: 4/5  Worsening symptoms from previous exam  Wt Readings from Last 3 Encounters:  09/10/16 103 lb (46.7 kg)  09/09/16 105 lb 3 oz (47.7 kg)  09/05/16 103 lb 14.4 oz (47.1 kg)      Impression and Recommendations:     This case required medical decision making of moderate complexity.

## 2016-09-09 NOTE — Telephone Encounter (Signed)
Received a fax from the pharmacy.  Lidocaine 5% patch is not covered by insurance.  Need new medication sent in.  Please advise.  Thanks!!

## 2016-09-09 NOTE — Patient Instructions (Addendum)
1. Schedule MRI thoracic spine with and without contrast 2. Schedule MRI lumbar spine with and without contrast 3. Start Lamotrigine 50mg : Take 1/2 tablet at night for 2 week, then increase to 1/2 tablet twice a day for 2 weeks, then increase to 1 tablet twice a day for 2 weeks and continue 4. Take Baclofen 10mg  1/2 tablet at night for the first week, if tolerating, can increase to 1/2 tablet twice a day 5. Our office will call you with results, follow-up in 3 months  Seizure Precautions: 1. If medication has been prescribed for you to prevent seizures, take it exactly as directed.  Do not stop taking the medicine without talking to your doctor first, even if you have not had a seizure in a long time.   2. Avoid activities in which a seizure would cause danger to yourself or to others.  Don't operate dangerous machinery, swim alone, or climb in high or dangerous places, such as on ladders, roofs, or girders.  Do not drive unless your doctor says you may.  3. If you have any warning that you may have a seizure, lay down in a safe place where you can't hurt yourself.    4.  No driving for 6 months from last seizure, as per [facility].   Please refer to the following link on the [facility] website for more information: http://www.epilepsyfoundation.org/answerplace/Social/driving/drivingu.cfm   5.  Maintain good sleep hygiene. Avoid alcohol.  6.  Contact your doctor if you have any problems that may be related to the medicine you are taking.  7.  Call 911 and bring the patient back to the ED if:        A.  The seizure lasts longer than 5 minutes.       B.  The patient doesn't awaken shortly after the seizure  C.  The patient has new problems such as difficulty seeing, speaking or moving  D.  The patient was injured during the seizure  E.  The patient has a temperature over 102 F (39C)  F.  The patient vomited and now is having trouble breathing

## 2016-09-09 NOTE — Progress Notes (Signed)
NEUROLOGY FOLLOW UP OFFICE NOTE  Kaitlyn Flowers SN:[id number]  HISTORY OF PRESENT ILLNESS: I had the pleasure of seeing Kaitlyn Flowers in follow-up in the neurology clinic on 09/10/2016. The patient was last seen 6 weeks ago after a syncopal episode on 03/03/16, then a transient episode of aphasia on 7/24 and again last 07/21/16. Stroke workup in July was unremarkable. She started taking full dose aspirin. With recurrence of transient aphasia, this time with headache, considerations included seizure versus complicated migraine. She had an EEG done which showed bilateral temporal slowing, left greater than right, at times sharply contoured. There were a few sharp transients that appeared more epileptogenic over the left frontotemporal region. She was started on Topamax, however reports side effects on this where she feels drowsy and depressed even on reduced dose. This was discontinued and she reports feeling much better. The headaches are very minimal as well, more rare, and pretty responsive to Tylenol. She denies any further aphasic episodes since 07/21/16.   She is asking about a concavity in the right sacroiliac region, which she only noticed a week ago while putting lotion. She has a history of chronic pain in this region and has seen several doctors for this, but states that the pain has been "horrendous" in the past 2-3 weeks. Pain seems to be localized where the concavity is. She has had neuropathic pain going down her leg in the past, which she states is minimal, instead she now feels a sensation of muscle pulling going down her right leg, which is very different and new. She denies any recent falls or heavy lifting, no bowel/bladder dysfunction. She denies any new paresthesias. 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  HPI 05/29/2016: This is a very pleasant 76 yo RH woman who presented after a syncopal episode on 03/03/16 then a transient episode of aphasia on  03/25/16. She lives alone and was sitting outside last 03/03/16. She recalls turning around to get water, then blacked out. She woke up on the ground and found her glasses broken, she hit the right side of her face. No tongue bite/incontinence. She denied any prodromal symptoms, no dizziness or headaches. She had been drinking a lot of water due to recurrent UTIs since January 2017. She went to the ER, it was felt fall was likely a result of orthostatic hypotension from sitting out in the heat. No bloodwork on record from that visit. Then on 03/25/16 she was reading something off her phone and could not make sense of it. She showed the phone to her friend who said it looked fine. Then she found she could not "get/grab my words." Her friend drove her to the ER, she felt her friend was driving the wrong way and she was thinking this, but could not say it. This lasted 20-30 minutes, no associated focal numbness/tingling/weakness, headaches, or dizziness. Her BP on arrival was 139/72. I personally reviewed head CT without contrast which did not show any acute changes. She was found to have a sodium level of 123. She was also diagnosed with another UTI with >100,000 aerococcus urinae. She denies any further recurrence of symptoms. She denies any olfactory/gustatory hallucinations, deja vu, rising epigastric sensation, focal numbness/tingling/weakness, myoclonic jerks. She has chronic back pain and self-catheterizes herself every night. She has chronic right leg numbness and weakness. For the past 1-1/2 years, she has had pain the right sacral area, worsened by sitting. She continues to work with PT.   She  underwent a stroke workup, I personally reviewed images available. 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 MRA head did not show any intracranial stenosis. Carotid dopplers did not show any evidence of ICA stenosis bilaterally. Her  echocardiogram showed normal EF 60-65%, normal RV size and systolic function, no significant valvular abnormalities, left atrium normal size. Lipid panel showed an LDL of 114, total cholesterol 241. She started to take a daily baby aspirin.  She saw her PCP on 11/6 reporting random headaches and wondered if related to her BP. She was scheduled for an earlier appointment today, then yesterday had another transient episode of aphasia. 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 When she tried to speak, her words were jumbled. She states the aphasia lasted 30-40 minutes. A friend took her home then to her PCP office at noon, where bloodwork was done. She went to the ER at 11pm, reporting severe headache that started an hour prior. She had an intense pressure and throbbing over the left temporal region, 8/10, with nausea and vomiting. Headache lasted until this morning. No photo/phono/phonophobia. There was no aphasia noted in the ER, headache was treated with IV fluids and APAP. She denied any focal weakness or paresthesias. She denied having a headache with the previous episode of aphasia. She has no prior history of migraines. Over the past 2 weeks, she has been having random headaches localized on different parts of her head, occurring every 3-4 days, resolving after Tylenol intake.   Her mother had a stroke. Otherwise she had a normal birth and early development.  There is no history of febrile convulsions, CNS infections such as meningitis/encephalitis, significant traumatic brain injury, neurosurgical procedures, or family history of seizures.  PAST MEDICAL HISTORY: Past Medical History:  Diagnosis Date  . Anemia   . Osteopenia   . PONV (postoperative nausea and vomiting)   . Seizures ([place])   . 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  . Wears hearing aid     MEDICATIONS: Current Outpatient  Prescriptions on File Prior to Visit  Medication Sig Dispense Refill  . aspirin EC 325 MG tablet Take 1 tablet (325 mg total) by mouth daily. 30 tablet 11  . b complex vitamins capsule Take 1 capsule by mouth daily.    . butalbital-acetaminophen-caffeine (FIORICET, ESGIC) 50-325-40 MG tablet Take 1 tablet as needed twice a day for headache. Do not take more than 2-3 times a week 10 tablet 4  . CALCIUM PO Take 1,000 Units by mouth daily.     . Cholecalciferol (VITAMIN D3) 5000 UNITS CAPS Take 1 capsule by mouth daily.    Marland Kitchen lidocaine (LIDODERM) 5 % Place 1 patch onto the skin daily. Remove & Discard patch within 12 hours or as directed by MD 30 patch 0  . LORazepam (ATIVAN) 1 MG tablet Take 1 tablet as needed for seizure. Do not take more than 2 tablets in a 24 hour period 10 tablet 4  . MAGNESIUM PO Take 1,200 mg by mouth every evening.    . Meth-Hyo-M Bl-Na Phos-Ph Sal (URO-MP) 118 MG CAPS Take 118 mg by mouth daily as needed. UTI symptoms  3  . Multiple Vitamin (MULTIVITAMIN WITH MINERALS) TABS tablet Take 1 tablet by mouth daily.    . nitrofurantoin (MACRODANTIN) 100 MG capsule Take 100 mg  by mouth at bedtime.  11  . RAPAFLO 8 MG CAPS capsule Take 8 mg by mouth every evening.     No current facility-administered medications on file prior to visit.     ALLERGIES: Allergies  Allergen Reactions  . Ibuprofen-Famotidine Other (See Comments)    Elevated BP  . Penicillins Rash    FAMILY HISTORY: Family History  Problem Relation Age of Onset  . Stroke Mother     2010  . Heart attack Father   . Anesthesia problems Neg Hx     SOCIAL HISTORY: Social History   Social History  . Marital status: Legally Separated    Spouse name: N/A  . Number of children: N/A  . Years of education: N/A   Occupational History  . Not on file.   Social History Main Topics  . Smoking status: Former Smoker    Packs/day: 0.25    Years: 2.00    Types: Cigarettes    Quit date: 09/03/1967  . Smokeless  tobacco: Never Used  . Alcohol use 2.4 oz/week    4 Glasses of wine per week  . Drug use: No  . Sexual activity: Not on file   Other Topics Concern  . 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   Born in [place] calls [place] home in [place] since [place]: Constitutional: No fevers, chills, or sweats, no generalized fatigue, change in appetite Eyes: No visual changes, double vision, eye pain Ear, nose and throat: No hearing loss, ear pain, nasal congestion, sore throat Cardiovascular: No chest pain, palpitations Respiratory:  No shortness of breath at rest or with exertion, wheezes GastrointestinaI: No nausea, vomiting, diarrhea, abdominal pain, fecal incontinence Genitourinary:  No dysuria, urinary retention or frequency Musculoskeletal:  + neck pain, back pain Integumentary: No rash, pruritus, skin lesions Neurological: as above Psychiatric: No depression, insomnia, anxiety Endocrine: No palpitations, fatigue, diaphoresis, mood swings, change in appetite, change in weight, increased thirst Hematologic/Lymphatic:  No anemia, purpura, petechiae. Allergic/Immunologic: no itchy/runny eyes, nasal congestion, recent allergic reactions, rashes  PHYSICAL EXAM: Vitals:   09/09/16 1524  BP: 132/64  Pulse: (!) 58   General: No acute distress Head:  Normocephalic/atraumatic Neck: supple, no paraspinal tenderness, full range of motion Heart:  Regular rate and rhythm Lungs:  Clear to auscultation bilaterally Back: No paraspinal tenderness Skin/Extremities: No rash, no edema. There is atrophy noted over the right sacroiliac joint region, otherwise full gluteal muscle tone and bulk Neurological Exam: alert and oriented to person, place, and time. No aphasia or dysarthria. Fund of knowledge is appropriate.  Recent and remote memory are intact.  Attention and concentration are normal.    Able to name objects and  repeat phrases, read and write. Cranial nerves: Pupils equal, round, reactive to light.  Fundoscopic exam unremarkable, no papilledema. Extraocular movements intact with no nystagmus. Visual fields full. Facial sensation intact. No facial asymmetry. Tongue, uvula, palate midline.  Motor: Bulk and tone normal, muscle strength 5/5 throughout with no pronator drift.  Sensation to light touch, temperature and vibration intact.  No extinction to double simultaneous stimulation.  Deep tendon reflexes 2+ throughout, toes downgoing.  Finger to nose testing intact.  Gait narrow-based and steady, able to tandem walk adequately.  Romberg negative.  IMPRESSION: This is a pleasant 76 yo RH woman who had a syncopal episode on 03/03/16, then a transient episode of expressive aphasia  last 03/25/16. Stroke workup was unremarkable, MRI brain did not show any acute changes, no significant stenosis seen intracranially or with carotid dopplers. Echo normal. She had another transient episode of aphasia on 07/21/16, this time followed by intense headache. No headache with previous event. We again discussed how it would be unusual to have a TIA with exactly the same symptoms in the setting of normal intra and extracranial vasculature. Her EEG was abnormal with bilateral temporal slowing and rare left frontotemporal epileptiform discharges. 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 She had side effects on Topamax, and is hesitant but agreeable to starting lamotrigine. Side effects were discussed, including Kathreen Cosier syndrome. She will start at low dose of 25mg  BID with uptitration over the next 6 weeks.   She is noticing new changes in the right sacroiliac region, with worsening pain. Her exam show localized atrophy in this region, ?gluteal muscle versus lipoatrophy. With her history of spinal meningioma, MRI thoracic and lumbar spine with and without contrast will be ordered  to assess for any nerve impingement. She was given a prescription for baclofen for the pain. Side effects were discussed. She is aware of [place] driving laws to stop driving after a seizure until 6 months seizure-free. She will follow-up in 3 months.   Thank you for allowing me to participate in her care.  Please do not hesitate to call for any questions or concerns.  The duration of this appointment visit was 25 minutes of face-to-face time with the patient.  Greater than 50% of this time was spent in counseling, explanation of diagnosis, planning of further management, and coordination of care.   Ellouise Newer, M.D.   CC: Dr. Regis Bill

## 2016-09-10 ENCOUNTER — Telehealth: Payer: Self-pay | Admitting: Neurology

## 2016-09-10 ENCOUNTER — Ambulatory Visit (INDEPENDENT_AMBULATORY_CARE_PROVIDER_SITE_OTHER): Payer: Medicare Other | Admitting: Family Medicine

## 2016-09-10 ENCOUNTER — Encounter: Payer: Self-pay | Admitting: Family Medicine

## 2016-09-10 ENCOUNTER — Encounter: Payer: Self-pay | Admitting: Neurology

## 2016-09-10 VITALS — BP 118/60 | HR 89 | Ht 61.0 in | Wt 103.0 lb

## 2016-09-10 DIAGNOSIS — M48062 Spinal stenosis, lumbar region with neurogenic claudication: Secondary | ICD-10-CM

## 2016-09-10 DIAGNOSIS — M461 Sacroiliitis, not elsewhere classified: Secondary | ICD-10-CM | POA: Diagnosis not present

## 2016-09-10 DIAGNOSIS — G894 Chronic pain syndrome: Secondary | ICD-10-CM | POA: Diagnosis not present

## 2016-09-10 MED ORDER — VITAMIN D (ERGOCALCIFEROL) 1.25 MG (50000 UNIT) PO CAPS: 50000.0000 [IU] | ORAL_CAPSULE | ORAL | 0 refills | Status: DC

## 2016-09-10 MED ORDER — DULOXETINE HCL 20 MG PO CPEP: 20.0000 mg | ORAL_CAPSULE | Freq: Every day | ORAL | 1 refills | Status: DC

## 2016-09-10 NOTE — Telephone Encounter (Signed)
Kaitlyn Flowers Jan 17, 2041. Her # [id number]. She only received 1 of the 2 medications yesterday at [facility] on [address]. The medication she needs is Lamotrigine. Thank you

## 2016-09-10 NOTE — Telephone Encounter (Signed)
Notified patient RX was just faxed.

## 2016-09-10 NOTE — Patient Instructions (Addendum)
Good to see you  Ice 20 minutes 2 times daily.  Tart cherry extract any dose at night Once weekly vitamin D for 12 weeks Sacral pillow could help Cymbalta 20mg  daily is the lowest dose and we will see if it helps See me again in 4 weeks.  Send me a message when you have the MRI.

## 2016-09-10 NOTE — Telephone Encounter (Signed)
Pt is returning misty call °

## 2016-09-10 NOTE — Telephone Encounter (Signed)
dont know of other substitute  Please request pharmacy to suggest alternative topicals

## 2016-09-10 NOTE — Assessment & Plan Note (Signed)
She does have more of a spinal stenosis. Patient has had a history of the meningioma. With patient having worsening symptoms I do encourage patient to get the MRI of the thoracic and lumbar spine was already ordered. I do believe that patient's atrophying could be secondary to either a viral illness versus potentially the injection was greater than a year and a half ago. Encourage her to do more of avoiding significant impact in the area. We will do a once weekly vitamin D to see if that'll be beneficial for muscle strength and endurance. I do believe that there is some chronic pain noted as well. Started on a low-dose of Cymbalta to see how patient response. Follow-up again in 4 weeks.

## 2016-09-10 NOTE — Telephone Encounter (Signed)
Left a message for a return call.

## 2016-09-10 NOTE — Assessment & Plan Note (Signed)
Chronic pain does have worsening symptoms. Started on Cymbalta. Patient will monitor for any type of side effects. We may need to increase at next follow-up.

## 2016-09-11 NOTE — Telephone Encounter (Signed)
Misty pt returned your call and we got disconnected.

## 2016-09-12 ENCOUNTER — Encounter: Payer: Self-pay | Admitting: Family Medicine

## 2016-09-12 NOTE — Telephone Encounter (Signed)
Spoke to the pt.  She will check with insurance and pharmacy to see what will be covered by insurance.  Will call back.

## 2016-09-14 ENCOUNTER — Ambulatory Visit
Admission: RE | Admit: 2016-09-14 | Discharge: 2016-09-14 | Disposition: A | Discharge status: Home / Self Care | Payer: Medicare Other | Source: Ambulatory Visit | Attending: Neurology | Admitting: Neurology

## 2016-09-14 DIAGNOSIS — D321 Benign neoplasm of spinal meninges: Secondary | ICD-10-CM

## 2016-09-14 DIAGNOSIS — M6258 Muscle wasting and atrophy, not elsewhere classified, other site: Secondary | ICD-10-CM

## 2016-09-14 DIAGNOSIS — M5440 Lumbago with sciatica, unspecified side: Secondary | ICD-10-CM

## 2016-09-14 MED ORDER — GADOBENATE DIMEGLUMINE 529 MG/ML IV SOLN
9.0000 mL | Freq: Once | INTRAVENOUS | Status: AC | PRN
  Administered 2016-09-14: 9 mL via INTRAVENOUS

## 2016-09-20 ENCOUNTER — Telehealth: Payer: Self-pay | Admitting: Neurology

## 2016-09-20 NOTE — Telephone Encounter (Signed)
Pls let her know the MRI did not show any tumor recurrence. The main findings with her lumbar spine are arthritis changes that are pinching on the nerves in her back. They could potentially be the cause of her worsened pain, at this point would recommend she see her surgeon again, in addition to physical therapy. Ok to slow down on lamotrigine, 1/2 tablet at night for 2 weeks for now, this is a common side effect of lamictal when first starting, it usually goes away in a week or so. Thanks

## 2016-09-20 NOTE — Telephone Encounter (Signed)
Contacted patient. She states her Mri results were posted on mychart but wanted them simplified for her. She also states she noticed after 3-4 days taking Lamotrigine she is feeling unbalanced and drowsy. She took a 1/2 tablet last night but no difference noticed. Please advise.

## 2016-09-20 NOTE — Telephone Encounter (Signed)
PT left a message regarding her MRI results and also a question about her medication/Dawn CB# [phone]

## 2016-09-23 NOTE — Telephone Encounter (Signed)
Patient is wanting to talk to someone please call [phone]

## 2016-09-23 NOTE — Telephone Encounter (Signed)
Patient notified of below. Verbalized understanding.

## 2016-09-27 ENCOUNTER — Ambulatory Visit (INDEPENDENT_AMBULATORY_CARE_PROVIDER_SITE_OTHER): Payer: Medicare Other | Admitting: Internal Medicine

## 2016-09-27 ENCOUNTER — Encounter: Payer: Self-pay | Admitting: Internal Medicine

## 2016-09-27 VITALS — BP 120/64 | HR 77 | Ht 61.0 in | Wt 105.4 lb

## 2016-09-27 DIAGNOSIS — R0989 Other specified symptoms and signs involving the circulatory and respiratory systems: Secondary | ICD-10-CM | POA: Insufficient documentation

## 2016-09-27 DIAGNOSIS — G909 Disorder of the autonomic nervous system, unspecified: Secondary | ICD-10-CM

## 2016-09-27 DIAGNOSIS — I10 Essential (primary) hypertension: Secondary | ICD-10-CM

## 2016-09-27 NOTE — Progress Notes (Signed)
OFFICE NOTE  Chief Complaint:  Labile blood pressures  Primary Care Physician: Kaitlyn Dawson, MD  HPI:  Kaitlyn Flowers is a 76 y.o. female who is kindly referred to me by Kaitlyn Flowers for evaluation of labile blood pressures. According to Kaitlyn Flowers, in December she was struggling with recurrent urinary tract infections. Around that time she had several seizure episodes which involved inability to speak or respond to stimuli. 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 She was started on Lamictal by Kaitlyn Flowers for seizures (or less likely complicated migraine) and initially had some fatigue related to this. The dose was adjusted and she reports that she feels very well now. She is noted that her blood pressures have now stabilized. Previously she had blood pressures as low as 80 systolic for which she was symptomatic and presyncopal. She also had a syncopal episode which is unexplained back in August of last year. She has not had any recurrent syncopal episodes. In addition she would get some hypertension with blood pressure up to [id number] systolic for which she said caused her headache. Additionally she is seen Kaitlyn Flowers for hyponatremia, suspicious for SIADH. She denies any chest pain, worsening shortness of breath, palpitations or any cardiac symptoms. Family history significant for father who had an MI however he was close to 65 and her mother had a fatal stroke. She has no pre-existing history of hypertension.   PMHx:  Past Medical History:  Diagnosis Date  . Anemia   . Osteopenia   . PONV (postoperative nausea and vomiting)   . Seizures (Kaitlyn Flowers)   . 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  . Wears hearing aid     Past Surgical History:  Procedure Laterality Date  . ABDOMINAL HYSTERECTOMY  1973   1/2 of ovary left cyst on ovary  . APPENDECTOMY     removed with hysterectomy  . LUMBAR  WOUND DEBRIDEMENT  12/06/2011   Procedure: LUMBAR WOUND DEBRIDEMENT;  Surgeon: Kristeen Miss, MD;  Location: [facility];  Service: Neurosurgery;  Laterality: N/A;  Repair of Pseudomeningocele Lumbar wound  . ROTATOR CUFF REPAIR     left   . shoulder rotator culff   2006  . spinal cord tumor  1987   2012 october     FAMHx:  Family History  Problem Relation Age of Onset  . Stroke Mother     2010  . Heart attack Father   . Anesthesia problems Neg Hx     SOCHx:   reports that she quit smoking about 49 years ago. Her smoking use included Cigarettes. She has a 0.50 pack-year smoking history. She has never used smokeless tobacco. She reports that she drinks about 2.4 oz of alcohol per week . She reports that she does not use drugs.  ALLERGIES:  Allergies  Allergen Reactions  . Ibuprofen-Famotidine Other (See Comments)    Elevated BP  . Penicillins Rash    ROS: Pertinent items noted in HPI and remainder of comprehensive ROS otherwise negative.  HOME MEDS: Current Outpatient Prescriptions on File Prior to Visit  Medication Sig Dispense Refill  . aspirin EC 325 MG tablet Take 1 tablet (325 mg total) by mouth daily. 30 tablet 11  . b complex vitamins capsule Take 1 capsule by mouth daily.    . butalbital-acetaminophen-caffeine (FIORICET, ESGIC) 50-325-40 MG tablet Take 1 tablet as needed twice  a day for headache. Do not take more than 2-3 times a week 10 tablet 4  . CALCIUM PO Take 1,000 Units by mouth daily.     . Cholecalciferol (VITAMIN D3) 5000 UNITS CAPS Take 1 capsule by mouth daily.    . LamoTRIgine 50 MG TBDP Take 1/2 tablet at night for 2 week, then increase to 1/2 tablet twice a day for 2 weeks, then increase to 1 tablet twice a day for 2 weeks and continue 60 tablet 2  . LORazepam (ATIVAN) 1 MG tablet Take 1 tablet as needed for seizure. Do not take more than 2 tablets in a 24 hour period 10 tablet 4  . MAGNESIUM PO Take 1,200 mg by mouth every evening.    . Meth-Hyo-M Bl-Na  Phos-Ph Sal (URO-MP) 118 MG CAPS Take 118 mg by mouth daily as needed. UTI symptoms  3  . Multiple Vitamin (MULTIVITAMIN WITH MINERALS) TABS tablet Take 1 tablet by mouth daily.    . nitrofurantoin (MACRODANTIN) 100 MG capsule Take 100 mg by mouth at bedtime.  11  . RAPAFLO 8 MG CAPS capsule Take 8 mg by mouth every evening.    . Vitamin D, Ergocalciferol, (DRISDOL) 50000 units CAPS capsule Take 1 capsule (50,000 Units total) by mouth every 7 (seven) days. 12 capsule 0   No current facility-administered medications on file prior to visit.     LABS/IMAGING: No results found for this or any previous visit (from the past 48 hour(s)). No results found.  WEIGHTS: Wt Readings from Last 3 Encounters:  09/27/16 105 lb 6.4 oz (47.8 kg)  09/10/16 103 lb (46.7 kg)  09/09/16 105 lb 3 oz (47.7 kg)    VITALS: BP 120/64 (BP Location: Right Arm)   Pulse 77   Ht 5\' 1"  (1.549 m)   Wt 105 lb 6.4 oz (47.8 kg)   LMP  (LMP Unknown)   BMI 19.92 kg/m   EXAM: General appearance: alert and no distress Neck: no carotid bruit and no JVD Lungs: clear to auscultation bilaterally Heart: regular rate and rhythm, S1, S2 normal, no murmur, click, rub or gallop Abdomen: soft, non-tender; bowel sounds normal; no masses,  no organomegaly Extremities: extremities normal, atraumatic, no cyanosis or edema Pulses: 2+ and symmetric Skin: Skin color, texture, turgor normal. No rashes or lesions Neurologic: Grossly normal Psych: Pleasant  EKG: Normal sinus rhythm at 77  ASSESSMENT: 1. 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  PLAN: 1.   Kaitlyn Flowers has had improvement in her blood pressure. I suspect this is related to autonomic instability. She's never had a problem with hypertension in the past. She has no significant cardiac findings on exam. I did not feel further cardiac workup is indicated. She has had intermittent improvement after  starting on the lamotrigine. This may be providing a stabilizing effect on her autonomic nervous system. She's not had any more seizure or syncopal episodes. I believe her neurologist is on the right track with this. Follow-up with me as needed.  Thanks for this interesting consultation. Please don't hesitate to contact me with any questions.  Pixie Casino, MD, [facility] Attending Cardiologist [place] C Hilty 09/27/2016, 9:44 AM

## 2016-09-27 NOTE — Patient Instructions (Signed)
Your physician recommends that you schedule a follow-up appointment as needed with Dr. Hilty.  

## 2016-10-02 NOTE — Progress Notes (Signed)
[name] [place], [place] [address] Phone: [phone] Subjective:    CC: Acute low back pain followup.  BJY:[id number]  Kaitlyn Flowers is a 76 y.o. female coming in with complaint of low back pain.patient does have radicular symptoms.  Patient has had this pain for quite some time. Patient has had significant workup for this. Patient did have some mild spinal stenosis that seemed to be progressing from previous MRI. Patient elected try an epidural. Did not have any significant relief. Was on prednisone that did help somewhat. Has had a positive ANA but negative titers. History of the removal of the spinal cord tumor.  Patient continued to have worsening symptoms. Was sent for MRI of the lumbar spine. This was Independently visualized by me showing significant L2-S1 degenerative disc disease with significant amount of mild-to-moderate stenosis.  Update 10/03/2016. Patient at last visit did have the MRI stated above. Patient was to start Cymbalta. Patient was also started on once weekly vitamin D. Patient states the medications and not making any significant improvement. Continues to have some mild radicular symptoms. Patient states that still has the chronic pain as well. Has to stand most of the day. Very difficult to find a comfortable position at night. Still though some mild weakness.  More past medical history Patient at last visit did have labs drawn and did have a positive in ANA but a negative titer. Patient did have a minor elevation an ESR and a minor elevation and parathyroid hormone. Patient's calcium is borderline high. Please see labs for further information. Past medical history is also significant for spinal meningioma.  X-rays were reviewed by me a previously and patient does have new degenerative changes at L3-L4, L4-L5, and L5-S1. Patient does have some foraminal narrowing on the right L5-S1. MRI on 10/16/2015showed multiple  levels of foraminal narrowing especially at the L4-L5 on the left side and L5-S1 on the left side as well. Moderate spinal stenosis noted. Patient had a epidural steroid injection 06/27/2014 with no significant improvement.  Repeat MRI showed slight progression of spinal stenosis from L2-S1 mostly. Possible nerve root impingement at L5 and S1 bilaterally.    Past medical history, social, surgical and family history all reviewed in electronic medical record.   Review of systems is positive for muscle aches and pains, mild weight loss, intermittent abdominal pain, headaches.  Objective  Blood pressure 120/78, pulse 80, height '5\' 1"'  (1.549 m), weight 105 lb 9.6 oz (47.9 kg).  General: No apparent distress alert and oriented x3 mood and affect normal, dressed appropriately.  HEENT: Pupils equal, extraocular movements intact  Respiratory: Patient's speak in full sentences and does not appear short of breath  Cardiovascular: No lower extremity edema, non tender, no erythema  Skin: Warm dry intact with no signs of infection or rash on extremities or on axial skeleton.  Abdomen: Soft nontender  Neuro: Cranial nerves II through XII are intact, neurovascularly intact in all extremities with 2+ DTRs and 2+ pulses.  Lymph: No lymphadenopathy of posterior or anterior cervical chain or axillae bilaterally.  Gait Very mild antalgic gait MSK:  Non tender with full range of motion and good stability and symmetric strength and tone of shoulders, elbows, wrist, hip, knee and ankles bilaterally. Arthritic changes of multiple joints Back Exam:  Inspection: She does have fat atrophy around the right sacroiliac joint with actually improved from previous exam. Motion: Flexion 35 deg, Extension 25 deg, Side Bending to 35 deg bilaterally,  Rotation to 35 deg bilaterally  SLR laying: Negative  does have increasing tightness compared to the contralateral side. XSLR laying: Negative  Palpable tenderness: Patient still  has significant tenderness mostly over the right sacroiliac joint as well as L4-L5 of the paraspinal musculature. Sensory change: Gross sensation intact to all lumbar and sacral dermatomes.  Reflexes: 2+ at both patellar tendons, 2+ at achilles tendons, Babinski's downgoing.  Strength at foot  Plantar-flexion: 5/5 Dorsi-flexion: 5/5 Eversion: 5/5 Inversion: 5/5  Leg strength  Quad: 5/5 Hamstring: 5/5 Hip flexor: 5/5 Hip abductors: 4/5 strength seems stable.   Wt Readings from Last 3 Encounters:  10/03/16 105 lb 9.6 oz (47.9 kg)  09/27/16 105 lb 6.4 oz (47.8 kg)  09/10/16 103 lb (46.7 kg)       Impression and Recommendations:     This case required medical decision making of moderate complexity.

## 2016-10-03 ENCOUNTER — Encounter: Payer: Self-pay | Admitting: Family Medicine

## 2016-10-03 ENCOUNTER — Ambulatory Visit (INDEPENDENT_AMBULATORY_CARE_PROVIDER_SITE_OTHER): Payer: Medicare Other | Admitting: Family Medicine

## 2016-10-03 VITALS — BP 120/78 | HR 80 | Ht 61.0 in | Wt 105.6 lb

## 2016-10-03 DIAGNOSIS — M48062 Spinal stenosis, lumbar region with neurogenic claudication: Secondary | ICD-10-CM | POA: Diagnosis not present

## 2016-10-03 DIAGNOSIS — M5416 Radiculopathy, lumbar region: Secondary | ICD-10-CM | POA: Diagnosis not present

## 2016-10-03 MED ORDER — HYDROCODONE-ACETAMINOPHEN 7.5-325 MG PO TABS: 1.0000 | ORAL_TABLET | Freq: Three times a day (TID) | ORAL | 0 refills | Status: DC | PRN

## 2016-10-03 NOTE — Assessment & Plan Note (Signed)
Discussed with patient again at great length. We went over patient's MRI in great detail. We discussed that there are likely different areas where there is nerve root impingement that is likely giving her the discomfort. We discussed that the fat atrophy that is over the right sacroiliac joint is already improving and will completely resolve at some point in the next several months. I do not believe that that is what is contributing to the worsening portion of the pain. We did discuss that there are different injections. Patient has had an epidural but never had any facet injections and has never had a nerve root injection. This could be a possibility. Patient will be referred to Dr. Maryjean Ka the bone interventional injections. I think that this would be the most beneficial for her. We discussed icing regimen and home exercises. Patient and will come back and see me again as needed  Spent  25 minutes with patient face-to-face and had greater than 50% of counseling including as described above in assessment and plan.

## 2016-10-03 NOTE — Patient Instructions (Addendum)
I hope that helps Norco if you need it but I cannot refill.  Dr. Maryjean Ka should call you  Send me messages otherwise.

## 2016-10-08 ENCOUNTER — Ambulatory Visit: Payer: Medicare Other | Admitting: Family Medicine

## 2016-10-09 ENCOUNTER — Other Ambulatory Visit (HOSPITAL_COMMUNITY): Payer: Self-pay | Admitting: Neurological Surgery

## 2016-10-09 ENCOUNTER — Other Ambulatory Visit: Payer: Self-pay | Admitting: Neurological Surgery

## 2016-10-09 DIAGNOSIS — M4727 Other spondylosis with radiculopathy, lumbosacral region: Secondary | ICD-10-CM

## 2016-10-16 ENCOUNTER — Telehealth: Payer: Self-pay | Admitting: *Deleted

## 2016-10-16 NOTE — Telephone Encounter (Signed)
Left msg on triage stating trying to see if she can get a refill on the medication MD rx at her last visit. Will be going out of town for about 10 days, and will run out. Forwarding msg to Dr. Tamala Julian assistant Ria Comment) concerning refills./lmb

## 2016-10-17 ENCOUNTER — Telehealth: Payer: Self-pay | Admitting: Internal Medicine

## 2016-10-31 ENCOUNTER — Ambulatory Visit (HOSPITAL_COMMUNITY)
Admission: RE | Admit: 2016-10-31 | Discharge: 2016-10-31 | Disposition: A | Discharge status: Home / Self Care | Payer: Medicare Other | Source: Ambulatory Visit | Attending: Neurological Surgery | Admitting: Neurological Surgery

## 2016-10-31 DIAGNOSIS — M4727 Other spondylosis with radiculopathy, lumbosacral region: Secondary | ICD-10-CM

## 2016-10-31 DIAGNOSIS — M5116 Intervertebral disc disorders with radiculopathy, lumbar region: Secondary | ICD-10-CM | POA: Insufficient documentation

## 2016-10-31 DIAGNOSIS — G039 Meningitis, unspecified: Secondary | ICD-10-CM | POA: Diagnosis not present

## 2016-10-31 MED ORDER — IOPAMIDOL (ISOVUE-M 200) INJECTION 41%
20.0000 mL | Freq: Once | INTRAMUSCULAR | Status: AC
  Administered 2016-10-31: 12 mL via INTRATHECAL

## 2016-10-31 MED ORDER — LIDOCAINE HCL (PF) 1 % IJ SOLN
5.0000 mL | Freq: Once | INTRAMUSCULAR | Status: AC
  Administered 2016-10-31: 5 mL via INTRADERMAL

## 2016-10-31 MED ORDER — LIDOCAINE HCL 1 % IJ SOLN
INTRAMUSCULAR | Status: AC
  Filled 2016-10-31: qty 10

## 2016-10-31 MED ORDER — DIAZEPAM 5 MG PO TABS
ORAL_TABLET | ORAL | Status: AC
  Administered 2016-10-31: 10 mg via ORAL
  Filled 2016-10-31: qty 2

## 2016-10-31 MED ORDER — IOPAMIDOL (ISOVUE-M 200) INJECTION 41%
INTRAMUSCULAR | Status: AC
  Administered 2016-10-31: 12 mL via INTRATHECAL
  Filled 2016-10-31: qty 10

## 2016-10-31 MED ORDER — DIAZEPAM 5 MG PO TABS
10.0000 mg | ORAL_TABLET | Freq: Once | ORAL | Status: AC
  Administered 2016-10-31: 10 mg via ORAL

## 2016-10-31 MED ORDER — ONDANSETRON HCL 4 MG/2ML IJ SOLN: 4.0000 mg | Freq: Four times a day (QID) | INTRAMUSCULAR | Status: DC | PRN

## 2016-10-31 MED ORDER — HYDROCODONE-ACETAMINOPHEN 5-325 MG PO TABS: 1.0000 | ORAL_TABLET | ORAL | Status: DC | PRN

## 2016-10-31 NOTE — Procedures (Signed)
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 She is developed some degenerative changes and a slight scoliosis in her lumbar spine she is evaluated in the office and advised that she needed further imaging does not tolerate an MRI well therefore I discussed doing myelography and a postmyelogram CAT scan to look at not only her lumbar spine but the thoracolumbar junction.  Pre op Dx: Lumbar spondylosis, lumbar scoliosis, lumbar radiculopathy. History of thoracolumbar meningioma. Post op Dx: Same Procedure: Lumbar myelogram with imaging at the thoracolumbar junction Surgeon: Kimara Bencomo Puncture level: L2-3 Fluid color: Clear colorless Injection: Isovue-200 12 mL Findings: Moderately severe spondylosis L3-4 L4-5 and L5-S1. Her their evaluation with CT scanning there and at the thoracolumbar junction.

## 2016-10-31 NOTE — Discharge Instructions (Signed)
Myelogram, Care After °These instructions give you information about caring for yourself after your procedure. Your doctor may also give you more specific instructions. Call your doctor if you have any problems or questions after your procedure. °Follow these instructions at home: °· Drink enough fluid to keep your pee (urine) clear or pale yellow. °· Rest as told by your doctor. °· Lie flat with your head slightly raised (elevated). °· Do not bend, lift, or do any hard activities for 24-48 hours or as told by your doctor. °· Take over-the-counter and prescription medicines only as told by your doctor. °· Take care of and remove your bandage (dressing) as told by your doctor. °· Bathe or shower as told by your doctor. °Contact a health care provider if: °· You have a fever. °· You have a headache that lasts longer than 24 hours. °· You feel sick to your stomach (nauseous). °· You throw up (vomit). °· Your neck is stiff. °· Your legs feel numb. °· You cannot pee. °· You cannot poop (have a bowel movement). °· You have a rash. °· You are itchy or sneezing. °Get help right away if: °· You have new symptoms or your symptoms get worse. °· You have a seizure. °· You have trouble breathing. °This information is not intended to replace advice given to you by your health care provider. Make sure you discuss any questions you have with your health care provider. °Document Released: 05/28/2008 Document Revised: 04/18/2016 Document Reviewed: 06/01/2015 °Elsevier Interactive Patient Education © 2017 [employer] ° °

## 2016-11-06 ENCOUNTER — Other Ambulatory Visit: Payer: Self-pay | Admitting: Family Medicine

## 2016-11-07 NOTE — Telephone Encounter (Signed)
Refill done.  

## 2016-11-14 ENCOUNTER — Telehealth: Payer: Self-pay | Admitting: Family Medicine

## 2016-11-14 NOTE — Telephone Encounter (Signed)
Pt called and said that she needed a refill on her Vitamin D2 1.25mg  sent in to [facility] on [address]. I see where there was a prescription sent in on 11/07/16 for  Vitamin D, Ergocalciferol, (DRISDOL) 50000 units CAPS capsule. Would this be the same thing? Thanks [employer]

## 2016-11-14 NOTE — Telephone Encounter (Signed)
Called patient and left message for her that prescription was called into her pharmacy.

## 2016-11-20 ENCOUNTER — Encounter: Payer: Self-pay | Admitting: Family Medicine

## 2016-11-28 ENCOUNTER — Other Ambulatory Visit: Payer: Self-pay

## 2016-12-02 MED ORDER — LAMOTRIGINE 25 MG PO TABS: 25.0000 mg | ORAL_TABLET | Freq: Every day | ORAL | 1 refills | Status: DC

## 2016-12-23 ENCOUNTER — Ambulatory Visit (INDEPENDENT_AMBULATORY_CARE_PROVIDER_SITE_OTHER): Payer: Medicare Other | Admitting: Internal Medicine

## 2016-12-23 ENCOUNTER — Encounter: Payer: Self-pay | Admitting: Internal Medicine

## 2016-12-23 VITALS — BP 110/68 | HR 80 | Wt 104.0 lb

## 2016-12-23 DIAGNOSIS — E871 Hypo-osmolality and hyponatremia: Secondary | ICD-10-CM

## 2016-12-23 DIAGNOSIS — M810 Age-related osteoporosis without current pathological fracture: Secondary | ICD-10-CM | POA: Diagnosis not present

## 2016-12-23 LAB — SODIUM: Sodium: 133 mEq/L — ABNORMAL LOW (ref 135–145)

## 2016-12-23 LAB — VITAMIN D 25 HYDROXY (VIT D DEFICIENCY, FRACTURES): VITD: 78.21 ng/mL (ref 30.00–100.00)

## 2016-12-23 NOTE — Patient Instructions (Signed)
Please stop at the lab.  Please come back for a follow-up appointment in 6 months.  

## 2016-12-23 NOTE — Progress Notes (Signed)
Patient ID: Kaitlyn Flowers, female   DOB: 1941/05/26, 76 y.o.   MRN: [id number]   HPI  Kaitlyn Flowers is a 76 y.o.-year-old female, returning for f/u for of hyponatremia. She also has OP. Last visit 07/2016.  Before last visit, she developed reading aphasia, confusion and thenverbal aphasia - lasted 30 min >> saw PCP (Na 132) >> the episode repeated, also + HA >> went to the ED (Na 129). No Na levels since then. Sees Dr Delice Lesch (neuro).  She has severe, chronic, low back pain. Since last visit (earlier this mo), she fell (back pain >> neuropathy in L leg >> weakness) >> sprained ankle >> bandaged >> fell again. She tried a spinal cord stimulator >> did not work at all.  Hyponatremia:  Reviewed and addended hx: Pt had orthostatic syncope 03/03/2016 >> ED. She then returned to ED with diffic. Speaking, mild confusion and blurry vision on 03/25/2016 >> CT head neg. for stroke >> assumed to be a TIA or neuro changes from UTI. Na was 123 then.  She started to have UTIs beginning of 2017 (pseudomonas), had PICC line >> then oral ABx. She had several recurrences.  She had a urinary catheter - which she was using 3x a day as it was suspected that she could not empty the bladder well. The frequency was then reduced to every other day, but she developed a new UTI, and now she is again cathing every day.  She started to drink more water in last month as she developed dry mouth. She was also trying to increase water intake with her UTIs.   MRI/MRA on 06/08/2016 did not show a pituitary mass. It showed chronic microvascular ds.  I and Os - before last visit: - ~68 oz in (~2L) - ~65 oz out (~1.9L)  >> on these, Na 129-132. Before last visit, she started to drink more water as her urine was more concentrated and she was afraid she may develop a UTI again. I advised her to not drink more than approximately 1.5 L of fluid per day (50 ounces). Since she felt well since last visit, she did not have to  increase her water intake, and she believes she is taking with a 1.5 L of fluid per day.   She is still on a low dose ABx, Rapaflo.  No vertigo, dizziness. No increased thirst, aphasia. She actually feels well and has no complaints other than back pain today.  Reviewed Na levels: Lab Results  Component Value Date   NA 129 (L) 08/09/2016   NA 132 (L) 07/22/2016   NA 129 (L) 07/10/2016   NA 132 (L) 07/09/2016   NA 130 (L) 06/21/2016   NA 132 (L) 04/02/2016   NA 126 (L) 03/27/2016   NA 124 (L) 03/25/2016   NA 123 (L) 03/25/2016   NA 135 02/13/2016   NA 135 11/02/2015   NA 135 07/11/2014   NA 132 (L) 02/10/2014   NA 139 08/24/2012   NA 138 08/22/2012   Other pertinent labs (04/02/2016): Osmolality     278 - 305 mOsm/kg 271 (L)  Cortisol, Plasma     ug/dL 10.2  C206 ACTH     6 - 50 pg/mL 16  Uric Acid, Serum     2.4 - 7.0 mg/dL 2.7  Osmolality, Urine     50 - 1,200 mOsm/kg 304  Potassium Urine     12 - 129 mmol/L 28  Sodium, Urine     28 - 272 mmol/L  30  Labs pointed towards improving SIADH/water O/L without appropriate renal water wasting. No adrenal insufficiency. No hypothyroidism. No HTG.  OP: Reviewed and addended hx: Pt was dx with OP in 2015.   She broke her R ankle roller skating. Now also sprained ankle.  No dizziness/vertigo/orthostasis.  I reviewed pt's DEXA scans:  Date L1-L4 T score FN T score  12/10/2013 -1.7 RFN: -2.7 LFN: -2.5   L1-L4 decreased by 5.8% from 2012 Total mean hip BMD decreased 7.7%* from 2012   01/08/2016 Lumbar spine (L1-L4) Femoral neck (FN)  T-score - 1.9  RFN: - 2.5 LFN: - 2.7  Change in BMD from previous DXA test (%) - 1.9%  + 0.4%   (*) statistically significant  I suggested Fosamax 70 mg weekly >> refused (03/2016).  No h/o hyper/hypocalcemia. No h/o kidney stones. Lab Results  Component Value Date   PTH 59 02/13/2016   PTH 66 (H) 07/11/2014   CALCIUM 9.3 08/09/2016   CALCIUM 9.8 07/09/2016   CALCIUM 10.1  06/21/2016   CALCIUM 9.9 04/02/2016   CALCIUM 9.6 03/27/2016   CALCIUM 9.6 03/25/2016   CALCIUM 9.8 02/13/2016   CALCIUM 9.3 02/13/2016   CALCIUM 10.1 11/02/2015   CALCIUM 10.0 07/11/2014   CALCIUM 9.8 07/11/2014   No h/o thyrotoxicosis. Reviewed TSH recent levels:  Lab Results  Component Value Date   TSH 1.28 02/13/2016   TSH 1.21 07/11/2014   TSH 1.11 11/11/2011   TSH 1.12 11/27/2010   + h/o vitamin D deficiency.  She was taking 5000 IU vit D daily >> stopped after 07/2016 check .  Last vitamin D: Lab Results  Component Value Date   VD25OH 64.30 07/22/2016   VD25OH 55.43 07/09/2016   VD25OH 94.31 04/02/2016   VD25OH 60.65 02/10/2014    She does take calcium supplements: calcium citrate. She eats dairy and green, leafy, vegetables.   No h/o CKD. Last BUN/Cr: Lab Results  Component Value Date   BUN 14 08/09/2016   CREATININE 0.67 08/09/2016   + weight bearing exercises: walks, Pilates.  Pt does have a FH of osteoporosis in mother/sisters. + fractures: kyphosis in mother.   Menopause was at 26 - surgical TAH + BSO   ROS: Constitutional: no weight gain/loss, no fatigue, no subjective hyperthermia/hypothermia Eyes: no blurry vision, no xerophthalmia ENT: no sore throat, no nodules palpated in throat, no dysphagia/odynophagia, no hoarseness, + decreased hearing Cardiovascular: no CP/SOB/palpitations/leg swelling Respiratory: no cough/SOB Gastrointestinal: no N/V/D/C Musculoskeletal: no muscle/joint aches Skin: no rashes Neurological: no tremors/numbness/tingling/dizziness + see HPI  I reviewed pt's medications, allergies, PMH, social hx, family hx, and changes were documented in the history of present illness. Otherwise, unchanged from my initial visit note.  Past Medical History:  Diagnosis Date  . Anemia   . Osteopenia   . PONV (postoperative nausea and vomiting)   . Seizures ([place])   . 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  .  Wears hearing aid    Past Surgical History:  Procedure Laterality Date  . ABDOMINAL HYSTERECTOMY  1973   1/2 of ovary left cyst on ovary  . APPENDECTOMY     removed with hysterectomy  . LUMBAR WOUND DEBRIDEMENT  12/06/2011   Procedure: LUMBAR WOUND DEBRIDEMENT;  Surgeon: Kristeen Miss, MD;  Location: [facility];  Service: Neurosurgery;  Laterality: N/A;  Repair of Pseudomeningocele Lumbar wound  . ROTATOR CUFF REPAIR     left   . shoulder rotator culff  2006  . spinal cord tumor  1987   2012 october    History   Social History  . Marital Status: married    Spouse Name: N/A    Number of Children: 2   Occupational History  . consultant   Social History Main Topics  . Smoking status: Former Smoker -- 0.25 packs/day for 2 years    Types: Cigarettes    Quit date: 09/03/1967  . Smokeless tobacco: Not on file  . Alcohol Use: 2.4 oz/week    4-5 Glasses of wine per week  . 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 aspirin EC 325 MG tablet Take 1 tablet (325 mg total) by mouth daily. 30 tablet 11  . b complex vitamins capsule Take 1 capsule by mouth daily.    . butalbital-acetaminophen-caffeine (FIORICET, ESGIC) 50-325-40 MG tablet Take 1 tablet as needed twice a day for headache. Do not take more than 2-3 times a week 10 tablet 4  . CALCIUM PO Take 1,000 Units by mouth daily.     Marland Kitchen HYDROcodone-acetaminophen (NORCO) 7.5-325 MG tablet Take 1 tablet by mouth every 8 (eight) hours as needed for moderate pain (cough). 15 tablet 0  . lamoTRIgine (LAMICTAL) 25 MG tablet Take 1 tablet (25 mg total) by mouth daily. 90 tablet 1  . LORazepam (ATIVAN) 1 MG tablet Take 1 tablet as needed for seizure. Do not take more than 2 tablets in a 24 hour period 10 tablet 4  . MAGNESIUM PO Take  1,200 mg by mouth every evening.    . Meth-Hyo-M Bl-Na Phos-Ph Sal (URO-MP) 118 MG CAPS Take 118 mg by mouth daily as needed. UTI symptoms  3  . Multiple Vitamin (MULTIVITAMIN WITH MINERALS) TABS tablet Take 1 tablet by mouth daily.    . nitrofurantoin (MACRODANTIN) 100 MG capsule Take 100 mg by mouth at bedtime.  11  . RAPAFLO 8 MG CAPS capsule Take 8 mg by mouth every evening.    . traMADol (ULTRAM) 50 MG tablet     . Vitamin D, Ergocalciferol, (DRISDOL) 50000 units CAPS capsule TAKE 1 CAPSULE (50,000 UNITS TOTAL) BY MOUTH EVERY 7 (SEVEN) DAYS. 12 capsule 0   No current facility-administered medications on file prior to visit.    Allergies  Allergen Reactions  . Ibuprofen-Famotidine Other (See Comments)    Elevated BP  . Penicillins Rash   Family History  Problem Relation Age of Onset  . Stroke Mother     2010  . Heart attack Father   . Anesthesia problems Neg Hx    PE: BP 110/68 (BP Location: Left Arm, Patient Position: Sitting)   Pulse 80   Wt 104 lb (47.2 kg)   LMP  (LMP Unknown)   BMI 19.65 kg/m  Wt Readings from Last 3 Encounters:  12/23/16 104 lb (47.2 kg)  10/31/16 103 lb (46.7 kg)  10/03/16 105 lb 9.6 oz (47.9 kg)   Constitutional: normal weight, in NAD. No kyphosis. Eyes: PERRLA, EOMI, no exophthalmos ENT: moist mucous membranes, no thyromegaly, no cervical lymphadenopathy Cardiovascular: RRR, No MRG Respiratory: CTA B Gastrointestinal: abdomen soft, NT, ND, BS+ Musculoskeletal: no deformities, strength intact in all 4 Skin: moist, warm, no rashes Neurological: no tremor with outstretched hands, DTR normal in  all 4  Assessment: 1. Hyponatremia  2. Osteoporosis  Plan: 1. Hyponatremia - Patient developed hyponatremia abruptly in 03/2016. She initially presented with orthostatic syncope and had difficulty speaking, mild confusion, blurry vision in the setting of a UTI. Her head CT and later an MRI was normal and her symptoms resolved. At that time, sodium  was 123. Subsequent sodium levels have been improving.  - At last visit, as she was not completely compliant with her fluid restrictions that we discussed about (1.5 L a day), she had another episode of aphasia for which she saw PCP. Sodium level was mildly low, at 132, then 129. I saw her then and again reinforced the fluid restriction. Since then, she started to feel better, with less thirst, and was able to stick to the fluid restriction. We'll check another sodium level today. - We discussed at last visit, the sodium tablets are another option, however, she also had fluctuating blood pressure around the time of her previous hyponatremic episode and I would like to avoid this, if possible. - I again reviewed the lab work from last visits >> point towards either SIADH or increase water intake that overcomes renal mechanisms of urine dilution (I suspect the latter). Treatment of choice for this condition is fluid restriction. - I will see her back in 6 months  2. Osteoporosis - will check another DEXA scan next year - latest vit D normal, but will repeat today, since she stopped her vitamin D supplements since then - I suggested to try osteo-strong loading, but I advised her to check with her orthopedic doctor first  Component     Latest Ref Rng & Units 12/23/2016          Sodium     135 - 145 mEq/L 133 (L)  VITD     30.00 - 100.00 ng/mL 78.21  Na better. Continue fluid restriction. Vitamin D great >> no supplementation needed.  Philemon Kingdom, MD PhD [facility] Endocrinology

## 2017-02-17 NOTE — Progress Notes (Signed)
Chief Complaint  Patient presents with  . Flank Pain    started a week    HPI: Kaitlyn Flowers 76 y.o.  For right side pain    Had hx of nyponatremia  Lumbar radicula and  Spinal surgery for tumor removal;  In past  Has had fluctuaing bp at seom point  Last ov with me was 1 18  Underlying utis and back pain    And hx of sbo in 2013? Or so   Now  New pinched feeling  Right side  abd  But  Reminiscent of when  She had "twisted bowel.   "Comes nad goes but now  A week  Into this and has  Solid burning sensation in abdomen  A bit better this am  Hard to figure out.  Uncomfortable.  Because of hx. Comes in for opinion  No fever  chills  Major change in bowel habits   Is non new med for uti  Suppression  Uncertain if related . No vomiting but some nausea . Has tramadol for pain now going to spine center   ROS: See pertinent positives and negatives per HPI. No fever chills   See above  bp has been ok   Past Medical History:  Diagnosis Date  . Anemia   . Osteopenia   . PONV (postoperative nausea and vomiting)   . Seizures ([place])   . 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  . Wears hearing aid     Family History  Problem Relation Age of Onset  . Stroke Mother        2010  . Heart attack Father   . Anesthesia problems Neg Hx     Social History   Social History  . Marital status: Legally Separated    Spouse name: N/A  . Number of children: N/A  . Years of education: N/A   Social History Main Topics  . Smoking status: Former Smoker    Packs/day: 0.25    Years: 2.00    Types: Cigarettes    Quit date: 09/03/1967  . Smokeless tobacco: Never Used  . Alcohol use 2.4 oz/week    4 Glasses of wine per week  . Drug use: No  . Sexual activity: Not Asked   Other Topics Concern  . 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 aspirin EC 325 MG tablet Take 1 tablet (325 mg total) by mouth daily. 30 tablet 11  . b complex vitamins capsule Take 1 capsule by mouth daily.    . butalbital-acetaminophen-caffeine (FIORICET, ESGIC) 50-325-40 MG tablet Take 1 tablet as needed twice a day for headache. Do not take more than 2-3 times a week 10 tablet 4  . CALCIUM PO Take 1,000 Units by mouth daily.     Marland Kitchen lamoTRIgine (LAMICTAL) 25 MG tablet Take 1 tablet (25 mg total) by mouth daily. 90 tablet 1  . LORazepam (ATIVAN) 1 MG tablet Take 1 tablet as needed for seizure. Do not take more than 2 tablets in a 24 hour period 10 tablet 4  . MAGNESIUM PO Take 1,200 mg by mouth every evening.    . Meth-Hyo-M Bl-Na Phos-Ph Sal (URO-MP) 118 MG CAPS Take 118 mg by mouth  daily as needed. UTI symptoms  3  . RAPAFLO 8 MG CAPS capsule Take 8 mg by mouth every evening.    . traMADol (ULTRAM) 50 MG tablet     . nitrofurantoin (MACRODANTIN) 100 MG capsule Take 100 mg by mouth at bedtime.  11  . Vitamin D, Ergocalciferol, (DRISDOL) 50000 units CAPS capsule TAKE 1 CAPSULE (50,000 UNITS TOTAL) BY MOUTH EVERY 7 (SEVEN) DAYS. 12 capsule 0  . HYDROcodone-acetaminophen (NORCO) 7.5-325 MG tablet Take 1 tablet by mouth every 8 (eight) hours as needed for moderate pain (cough). 15 tablet 0  . Multiple Vitamin (MULTIVITAMIN WITH MINERALS) TABS tablet Take 1 tablet by mouth daily.     No facility-administered medications prior to visit.      EXAM:  BP 120/70 (BP Location: Right Arm, Patient Position: Sitting, Cuff Size: Normal)   Pulse 88   Temp 97.6 F (36.4 C) (Oral)   Ht 5\' 1"  (1.549 m)   Wt 101 lb (45.8 kg)   LMP  (LMP Unknown)   BMI 19.08 kg/m   Body mass index is 19.08 kg/m.  GENERAL: vitals reviewed and listed above, alert, oriented, appears well hydrated and in no acute distress HEENT: atraumatic, conjunctiva  clear, no obvious abnormalities on inspection of external nose and  ears NECK: no obvious masses on inspection palpation  LUNGS: clear to auscultation bilaterally, no wheezes, rales or rhonchi, good air movement CV: HRRR, no clubbing cyanosis or  peripheral edema nl cap refill  Abdomen:  normal bowel sounds without hepatosplenomegaly, a slight guard righ side mid abd  No rebound   or masses no CVA tenderness area iof tenderness mid r abd   No hernria    abd bruit  Centrally   MS: moves all extremities  PSYCH: pleasant and cooperative,   cognition seems nl  Skin nl turgor color    ASSESSMENT AND PLAN:  Discussed the following assessment and plan:  Right-sided abdominal pain of unknown cause - optinos disc get abd series  gi consult never had fu gi or srugery after sbo  hx of other  medical issues  - Plan: DG Abd Acute W/Chest, Ambulatory referral to Gastroenterology  Hx SBO - Plan: DG Abd Acute W/Chest, Ambulatory referral to Gastroenterology  Hx: UTI (urinary tract infection) - urology care  -Patient advised to return or notify health care team  if symptoms worsen ,persist or new concerns arise.  Patient Instructions  Get a plain x ray  As we discussed to looka t bowel pattern.   We will get  Referral to GI  .     You saw dr Buccini as OP.    Clear liquids for now  And if pain gets worse get to [place] and or ED.        Standley Brooking. Panosh M.D.

## 2017-02-18 ENCOUNTER — Ambulatory Visit (INDEPENDENT_AMBULATORY_CARE_PROVIDER_SITE_OTHER)
Admission: RE | Admit: 2017-02-18 | Discharge: 2017-02-18 | Disposition: A | Discharge status: Home / Self Care | Payer: Medicare Other | Source: Ambulatory Visit | Attending: Internal Medicine | Admitting: Internal Medicine

## 2017-02-18 ENCOUNTER — Ambulatory Visit (INDEPENDENT_AMBULATORY_CARE_PROVIDER_SITE_OTHER): Payer: Medicare Other | Admitting: Internal Medicine

## 2017-02-18 ENCOUNTER — Encounter: Payer: Self-pay | Admitting: Internal Medicine

## 2017-02-18 VITALS — BP 120/70 | HR 88 | Temp 97.6°F | Ht 61.0 in | Wt 101.0 lb

## 2017-02-18 DIAGNOSIS — Z87440 Personal history of urinary (tract) infections: Secondary | ICD-10-CM

## 2017-02-18 DIAGNOSIS — Z8719 Personal history of other diseases of the digestive system: Secondary | ICD-10-CM

## 2017-02-18 DIAGNOSIS — R109 Unspecified abdominal pain: Secondary | ICD-10-CM | POA: Diagnosis not present

## 2017-02-18 NOTE — Patient Instructions (Addendum)
Get a plain x ray  As we discussed to looka t bowel pattern.   We will get  Referral to GI  .     You saw dr Buccini as OP.    Clear liquids for now  And if pain gets worse get to [place] and or ED.

## 2017-02-19 ENCOUNTER — Other Ambulatory Visit: Payer: Self-pay | Admitting: Physician Assistant

## 2017-02-19 DIAGNOSIS — K921 Melena: Secondary | ICD-10-CM

## 2017-02-19 DIAGNOSIS — R1031 Right lower quadrant pain: Secondary | ICD-10-CM

## 2017-02-19 DIAGNOSIS — R1032 Left lower quadrant pain: Secondary | ICD-10-CM

## 2017-02-19 LAB — CBC AND DIFFERENTIAL
HCT: 36 (ref 36–46)
Hemoglobin: 12.2 (ref 12.0–16.0)
Neutrophils Absolute: 5
Platelets: 297 (ref 150–399)
WBC: 8.3

## 2017-02-19 LAB — HEPATIC FUNCTION PANEL
ALT: 11 (ref 7–35)
AST: 14 (ref 13–35)
Alkaline Phosphatase: 78 (ref 25–125)
Bilirubin, Total: 0.7

## 2017-02-19 LAB — BASIC METABOLIC PANEL
BUN: 16 (ref 4–21)
Creatinine: 0.8 (ref 0.5–1.1)
Glucose: 87
Potassium: 5.1 (ref 3.4–5.3)
Sodium: 133 — AB (ref 137–147)

## 2017-02-24 ENCOUNTER — Ambulatory Visit
Admission: RE | Admit: 2017-02-24 | Discharge: 2017-02-24 | Disposition: A | Discharge status: Home / Self Care | Payer: Medicare Other | Source: Ambulatory Visit | Attending: Physician Assistant | Admitting: Physician Assistant

## 2017-02-24 DIAGNOSIS — R1032 Left lower quadrant pain: Secondary | ICD-10-CM

## 2017-02-24 DIAGNOSIS — K921 Melena: Secondary | ICD-10-CM

## 2017-02-24 DIAGNOSIS — R1031 Right lower quadrant pain: Secondary | ICD-10-CM

## 2017-02-24 MED ORDER — IOPAMIDOL (ISOVUE-300) INJECTION 61%
100.0000 mL | Freq: Once | INTRAVENOUS | Status: AC | PRN
  Administered 2017-02-24: 100 mL via INTRAVENOUS

## 2017-02-25 ENCOUNTER — Encounter: Payer: Self-pay | Admitting: Family Medicine

## 2017-03-06 NOTE — Progress Notes (Deleted)
Pre visit review using our clinic review tool, if applicable. No additional management support is needed unless otherwise documented below in the visit note. 

## 2017-03-06 NOTE — Progress Notes (Deleted)
Subjective:   Kaitlyn Flowers is a 76 y.o. female who presents for Medicare Annual (Subsequent) preventive examination.  Review of Systems:  No ROS.  Medicare Wellness Visit. Additional risk factors are reflected in the social history.   Sleep patterns:  Home Safety/Smoke Alarms: Feels safe in home. Smoke alarms in place.  Living environment; residence and Firearm Safety: [place] Safety/Bike Helmet: Wears seat belt.   Counseling:   Eye Exam-  Dental-  Female:   Pap-       Mammo-   12/23/2005   Negative. Dexa scan-       01/08/2016 Osteoporosis. CCS-  02/07/2011   10 years.     Objective:     Vitals: LMP  (LMP Unknown)   There is no height or weight on file to calculate BMI.   Tobacco History  Smoking Status  . Former Smoker  . Packs/day: 0.25  . Years: 2.00  . Types: Cigarettes  . Quit date: 09/03/1967  Smokeless Tobacco  . Never Used     Counseling given: Not Answered   Past Medical History:  Diagnosis Date  . Anemia   . Osteopenia   . PONV (postoperative nausea and vomiting)   . Seizures ([place])   . 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  . Wears hearing aid    Past Surgical History:  Procedure Laterality Date  . ABDOMINAL HYSTERECTOMY  1973   1/2 of ovary left cyst on ovary  . APPENDECTOMY     removed with hysterectomy  . LUMBAR WOUND DEBRIDEMENT  12/06/2011   Procedure: LUMBAR WOUND DEBRIDEMENT;  Surgeon: Kristeen Miss, MD;  Location: [facility];  Service: Neurosurgery;  Laterality: N/A;  Repair of Pseudomeningocele Lumbar wound  . ROTATOR CUFF REPAIR     left   . shoulder rotator culff   2006  . spinal cord tumor  1987   2012 october    Family History  Problem Relation Age of Onset  . Stroke Mother        2010  . Heart attack Father   . Anesthesia problems Neg Hx    History  Sexual Activity  . Sexual activity: Not on file    Outpatient Encounter Prescriptions as of 03/11/2017  Medication Sig  . aspirin EC 325  MG tablet Take 1 tablet (325 mg total) by mouth daily.  Marland Kitchen b complex vitamins capsule Take 1 capsule by mouth daily.  . butalbital-acetaminophen-caffeine (FIORICET, ESGIC) 50-325-40 MG tablet Take 1 tablet as needed twice a day for headache. Do not take more than 2-3 times a week  . CALCIUM PO Take 1,000 Units by mouth daily.   Marland Kitchen CRANBERRY PO Take by mouth 3 (three) times daily.  Marland Kitchen ESTRACE VAGINAL 0.1 MG/GM vaginal cream USE SMALL AMOUNT VAGINALLY 2-3 TIMES PER WEEK  . lamoTRIgine (LAMICTAL) 25 MG tablet Take 1 tablet (25 mg total) by mouth daily.  Marland Kitchen LORazepam (ATIVAN) 1 MG tablet Take 1 tablet as needed for seizure. Do not take more than 2 tablets in a 24 hour period  . MAGNESIUM PO Take 1,200 mg by mouth every evening.  . Meth-Hyo-M Bl-Na Phos-Ph Sal (URO-MP) 118 MG CAPS Take 118 mg by mouth daily as needed. UTI symptoms  . methenamine (HIPREX) 1 g tablet Take 1 g by mouth 2 (two) times daily with a meal.  . Omega-3 Fatty Acids (FISH OIL PO) Take by mouth 2 (two) times daily.  . Probiotic Product (PROBIOTIC PO)  Take by mouth 2 (two) times daily.  Marland Kitchen RAPAFLO 8 MG CAPS capsule Take 8 mg by mouth every evening.  . traMADol (ULTRAM) 50 MG tablet    No facility-administered encounter medications on file as of 03/11/2017.     Activities of Daily Living In your present state of health, do you have any difficulty performing the following activities: 10/31/2016  Hearing? N  Vision? N  Difficulty concentrating or making decisions? N  Walking or climbing stairs? Y  Dressing or bathing? N  Some recent data might be hidden    Patient Care Team: Panosh, Standley Brooking, MD as PCP - Lisa Roca, MD (Neurosurgery) Carlyle Basques, MD as Consulting Physician (Infectious Diseases)    Assessment:    Physical assessment deferred to PCP.  Exercise Activities and Dietary recommendations   Diet (meal preparation, eat out, water intake, caffeinated beverages, dairy products, fruits and  vegetables): Breakfast: Lunch:  Dinner:      Goals    None     Fall Risk Fall Risk  09/09/2016 07/22/2016 07/10/2016 05/29/2016 11/20/2015  Falls in the past year? Yes Yes No Yes No  Number falls in past yr: 1 - - 1 -  Injury with Fall? No - - No -   Depression Screen PHQ 2/9 Scores 11/20/2015 11/13/2015 11/08/2015  PHQ - 2 Score 1 0 0  PHQ- 9 Score 1 - -     Cognitive Function        Immunization History  Administered Date(s) Administered  . Influenza Split 05/29/2012  . Influenza Whole 06/30/2007  . Influenza, High Dose Seasonal PF 07/18/2014, 05/23/2015, 06/21/2016  . Pneumococcal Conjugate-13 06/21/2016  . Pneumococcal Polysaccharide-23 12/18/2010  . Tdap 12/18/2010  . Zoster 06/12/2012   Screening Tests Health Maintenance  Topic Date Due  . MAMMOGRAM  12/24/2007  . INFLUENZA VACCINE  04/02/2017  . TETANUS/TDAP  12/17/2020  . COLONOSCOPY  02/06/2021  . DEXA SCAN  Completed  . PNA vac Low Risk Adult  Completed      Plan:   ***   I have personally reviewed and noted the following in the patient's chart:   . Medical and social history . Use of alcohol, tobacco or illicit drugs  . Current medications and supplements . Functional ability and status . Nutritional status . Physical activity . Advanced directives . List of other physicians . Vitals . Screenings to include cognitive, depression, and falls . Referrals and appointments  In addition, I have reviewed and discussed with patient certain preventive protocols, quality metrics, and best practice recommendations. A written personalized care plan for preventive services as well as general preventive health recommendations were provided to patient.     Ree Edman, RN  03/06/2017

## 2017-03-11 ENCOUNTER — Ambulatory Visit: Payer: Medicare Other

## 2017-03-20 ENCOUNTER — Other Ambulatory Visit: Payer: Self-pay | Admitting: Gastroenterology

## 2017-03-20 DIAGNOSIS — R935 Abnormal findings on diagnostic imaging of other abdominal regions, including retroperitoneum: Secondary | ICD-10-CM

## 2017-03-20 DIAGNOSIS — R1033 Periumbilical pain: Secondary | ICD-10-CM

## 2017-03-24 ENCOUNTER — Ambulatory Visit
Admission: RE | Admit: 2017-03-24 | Discharge: 2017-03-24 | Disposition: A | Discharge status: Home / Self Care | Payer: Medicare Other | Source: Ambulatory Visit | Attending: Gastroenterology | Admitting: Gastroenterology

## 2017-03-24 DIAGNOSIS — R1033 Periumbilical pain: Secondary | ICD-10-CM

## 2017-03-24 DIAGNOSIS — R935 Abnormal findings on diagnostic imaging of other abdominal regions, including retroperitoneum: Secondary | ICD-10-CM

## 2017-03-24 MED ORDER — IOPAMIDOL (ISOVUE-370) INJECTION 76%
100.0000 mL | Freq: Once | INTRAVENOUS | Status: AC | PRN
  Administered 2017-03-24: 100 mL via INTRAVENOUS

## 2017-04-07 NOTE — Progress Notes (Deleted)
Subjective:   Kaitlyn Flowers is a 76 y.o. female who presents for Medicare Annual (Subsequent) preventive examination.  The Patient was informed that the wellness visit is to identify future health risk and educate and initiate measures that can reduce risk for increased disease through the lifespan.    Annual Wellness Assessment  Reports health as   Preventive Screening -Counseling & Management  Medicare Annual Preventive Care Visit - Subsequent Last OV 02/18/2017 Lab chol/hdl 2   Mammogram due - from 2007 Colonoscopy 02/2011 Dexa; 01/2016 + osteoporosis Flu vaccine not available  Describes Health as poor, fair, good or great?   VS reviewed;  02/18/17 12/23/16 10/03/16   BP 120/70 110/68 120/78     Diet   BMI  Exercise  Dental  Stressors:   Sleep patterns:   Pain?   Advanced Directives  Patient Care Team: Panosh, Standley Brooking, MD as PCP - Lisa Roca, MD (Neurosurgery) Carlyle Basques, MD as Consulting Physician (Infectious Diseases) Assessed for additional providers  Immunization History  Administered Date(s) Administered  . Influenza Split 05/29/2012  . Influenza Whole 06/30/2007  . Influenza, High Dose Seasonal PF 07/18/2014, 05/23/2015, 06/21/2016  . Pneumococcal Conjugate-13 06/21/2016  . Pneumococcal Polysaccharide-23 12/18/2010  . Tdap 12/18/2010  . Zoster 06/12/2012   Required Immunizations needed today  Screening test up to date or reviewed for plan of completion Health Maintenance Due  Topic Date Due  . MAMMOGRAM  12/24/2007  . INFLUENZA VACCINE  04/02/2017                 Objective:     Vitals: LMP  (LMP Unknown)   There is no height or weight on file to calculate BMI.   Tobacco History  Smoking Status  . Former Smoker  . Packs/day: 0.25  . Years: 2.00  . Types: Cigarettes  . Quit date: 09/03/1967  Smokeless Tobacco  . Never Used     Counseling given: Not Answered   Past Medical History:  Diagnosis Date   . Anemia   . Osteopenia   . PONV (postoperative nausea and vomiting)   . Seizures ([place])   . 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  . Wears hearing aid    Past Surgical History:  Procedure Laterality Date  . ABDOMINAL HYSTERECTOMY  1973   1/2 of ovary left cyst on ovary  . APPENDECTOMY     removed with hysterectomy  . LUMBAR WOUND DEBRIDEMENT  12/06/2011   Procedure: LUMBAR WOUND DEBRIDEMENT;  Surgeon: Kristeen Miss, MD;  Location: [facility];  Service: Neurosurgery;  Laterality: N/A;  Repair of Pseudomeningocele Lumbar wound  . ROTATOR CUFF REPAIR     left   . shoulder rotator culff   2006  . spinal cord tumor  1987   2012 october    Family History  Problem Relation Age of Onset  . Stroke Mother        2010  . Heart attack Father   . Anesthesia problems Neg Hx    History  Sexual Activity  . Sexual activity: Not on file    Outpatient Encounter Prescriptions as of 04/08/2017  Medication Sig  . aspirin EC 325 MG tablet Take 1 tablet (325 mg total) by mouth daily.  Marland Kitchen b complex vitamins capsule Take 1 capsule by mouth daily.  . butalbital-acetaminophen-caffeine (FIORICET, ESGIC) 50-325-40 MG tablet Take 1 tablet as needed twice a day for headache. Do not take more than 2-3 times  a week  . CALCIUM PO Take 1,000 Units by mouth daily.   Marland Kitchen CRANBERRY PO Take by mouth 3 (three) times daily.  Marland Kitchen ESTRACE VAGINAL 0.1 MG/GM vaginal cream USE SMALL AMOUNT VAGINALLY 2-3 TIMES PER WEEK  . lamoTRIgine (LAMICTAL) 25 MG tablet Take 1 tablet (25 mg total) by mouth daily.  Marland Kitchen LORazepam (ATIVAN) 1 MG tablet Take 1 tablet as needed for seizure. Do not take more than 2 tablets in a 24 hour period  . MAGNESIUM PO Take 1,200 mg by mouth every evening.  . Meth-Hyo-M Bl-Na Phos-Ph Sal (URO-MP) 118 MG CAPS Take 118 mg by mouth daily as needed. UTI symptoms  . methenamine (HIPREX) 1 g tablet Take 1 g by mouth 2 (two) times daily with a meal.  . Omega-3 Fatty Acids (FISH OIL PO)  Take by mouth 2 (two) times daily.  . Probiotic Product (PROBIOTIC PO) Take by mouth 2 (two) times daily.  Marland Kitchen RAPAFLO 8 MG CAPS capsule Take 8 mg by mouth every evening.  . traMADol (ULTRAM) 50 MG tablet    No facility-administered encounter medications on file as of 04/08/2017.     Activities of Daily Living In your present state of health, do you have any difficulty performing the following activities: 10/31/2016  Hearing? N  Vision? N  Difficulty concentrating or making decisions? N  Walking or climbing stairs? Y  Dressing or bathing? N  Some recent data might be hidden    Patient Care Team: Panosh, Standley Brooking, MD as PCP - Lisa Roca, MD (Neurosurgery) Carlyle Basques, MD as Consulting Physician (Infectious Diseases)    Assessment:    *** Exercise Activities and Dietary recommendations    Goals    None     Fall Risk Fall Risk  09/09/2016 07/22/2016 07/10/2016 05/29/2016 11/20/2015  Falls in the past year? Yes Yes No Yes No  Number falls in past yr: 1 - - 1 -  Injury with Fall? No - - No -   Depression Screen PHQ 2/9 Scores 11/20/2015 11/13/2015 11/08/2015  PHQ - 2 Score 1 0 0  PHQ- 9 Score 1 - -     Cognitive Function        Immunization History  Administered Date(s) Administered  . Influenza Split 05/29/2012  . Influenza Whole 06/30/2007  . Influenza, High Dose Seasonal PF 07/18/2014, 05/23/2015, 06/21/2016  . Pneumococcal Conjugate-13 06/21/2016  . Pneumococcal Polysaccharide-23 12/18/2010  . Tdap 12/18/2010  . Zoster 06/12/2012   Screening Tests Health Maintenance  Topic Date Due  . MAMMOGRAM  12/24/2007  . INFLUENZA VACCINE  04/02/2017  . TETANUS/TDAP  12/17/2020  . COLONOSCOPY  02/06/2021  . DEXA SCAN  Completed  . PNA vac Low Risk Adult  Completed      Plan:   ***   I have personally reviewed and noted the following in the patient's chart:   . Medical and social history . Use of alcohol, tobacco or illicit drugs  . Current medications  and supplements . Functional ability and status . Nutritional status . Physical activity . Advanced directives . List of other physicians . Hospitalizations, surgeries, and ER visits in previous 12 months . Vitals . Screenings to include cognitive, depression, and falls . Referrals and appointments  In addition, I have reviewed and discussed with patient certain preventive protocols, quality metrics, and best practice recommendations. A written personalized care plan for preventive services as well as general preventive health recommendations were provided to patient.     WUJWJ,XBJYN,  RN  04/07/2017

## 2017-04-08 ENCOUNTER — Ambulatory Visit: Payer: Medicare Other

## 2017-04-17 ENCOUNTER — Encounter: Payer: Self-pay | Admitting: Internal Medicine

## 2017-04-17 ENCOUNTER — Ambulatory Visit (INDEPENDENT_AMBULATORY_CARE_PROVIDER_SITE_OTHER): Payer: Medicare Other | Admitting: Internal Medicine

## 2017-04-17 VITALS — BP 99/60 | HR 90 | Temp 97.8°F | Ht 61.0 in | Wt 98.0 lb

## 2017-04-17 DIAGNOSIS — R300 Dysuria: Secondary | ICD-10-CM | POA: Diagnosis not present

## 2017-04-17 DIAGNOSIS — N390 Urinary tract infection, site not specified: Secondary | ICD-10-CM

## 2017-04-17 MED ORDER — ESTRADIOL 10 MCG VA TABS: 1.0000 | ORAL_TABLET | VAGINAL | 11 refills | Status: DC

## 2017-04-17 NOTE — Progress Notes (Signed)
Patient ID: Kaitlyn Flowers, female   DOB: Aug 08, 1941, 76 y.o.   MRN: [id number]  HPI Kaitlyn Flowers is a76yo F with history of recurrent UTI, we have not seen her in over a year but her urologist,Dr otelin, has been managing her urinary symptoms. Roughly getting treated for uti every other month. She was briefly placed her on chronic suppression with macrobid. Then switched to methamine but still had abtx. She was Last given Iv injection roughly 2 wk. 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 Many she reports having dysuria,has frequency with difficulty starting her urinary stream. Feels like she has bladder pressure. She states that she almost daily feels like she has dysuria but differentiates urinary frequency as her "uti". When she has bladder pressure she self-catheterizes. She is unaware of any urinary anatomy abn per her work up with dr. Edwena Blow. She feels that her uti -bacteria is becoming more resistant. She uses estradiol external cream  Has been using urinatry cathteter if Urinary retention  Hx of fosfomycin -in the past   Used to eat and have abdominal cramping, with associated - seen by dr Cristina Gong, improving  Outpatient Encounter Prescriptions as of 04/17/2017  Medication Sig  . aspirin EC 325 MG tablet Take 1 tablet (325 mg total) by mouth daily.  . butalbital-acetaminophen-caffeine (FIORICET, ESGIC) 50-325-40 MG tablet Take 1 tablet as needed twice a day for headache. Do not take more than 2-3 times a week  . CALCIUM PO Take 1,000 Units by mouth daily.   Marland Kitchen CRANBERRY PO Take by mouth 3 (three) times daily.  Marland Kitchen ESTRACE VAGINAL 0.1 MG/GM vaginal cream USE SMALL AMOUNT VAGINALLY 2-3 TIMES PER WEEK  . lamoTRIgine (LAMICTAL) 25 MG tablet Take 1 tablet (25 mg total) by mouth daily.  Marland Kitchen MAGNESIUM PO Take 1,200 mg by mouth every evening.  . Meth-Hyo-M Bl-Na Phos-Ph Sal (URO-MP) 118 MG CAPS Take 118 mg by mouth daily as needed. UTI symptoms  . Omega-3 Fatty Acids (FISH OIL  PO) Take by mouth 2 (two) times daily.  . Probiotic Product (PROBIOTIC PO) Take by mouth 2 (two) times daily.  Marland Kitchen RAPAFLO 8 MG CAPS capsule Take 8 mg by mouth every evening.  . traMADol (ULTRAM) 50 MG tablet   . b complex vitamins capsule Take 1 capsule by mouth daily.  Marland Kitchen LORazepam (ATIVAN) 1 MG tablet Take 1 tablet as needed for seizure. Do not take more than 2 tablets in a 24 hour period (Patient not taking: Reported on 04/17/2017)  . methenamine (HIPREX) 1 g tablet Take 1 g by mouth 2 (two) times daily with a meal.   No facility-administered encounter medications on file as of 04/17/2017.      Patient Active Problem List   Diagnosis Date Noted  . Labile hypertension 09/27/2016  . Autonomic dysfunction 09/27/2016  . Chronic pain syndrome 09/10/2016  . Localization-related idiopathic epilepsy and epileptic syndromes with seizures of localized onset, not intractable, without status epilepticus ([place]) 08/01/2016  . Hyponatremia 04/01/2016  . Aphasia   . Abdominal cramping 01/22/2016  . Nocturnal leg cramps 12/07/2015  . UTI (lower urinary tract infection) 10/02/2015  . 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 Sacroiliitis ([place]) 06/06/2014  . Lumbar radicular pain 06/06/2014  . Right low back pain 01/07/2014  . Anemia 01/07/2014  . Osteoporosis 12/26/2013  . Glucosuria 12/16/2012  . Tick bite of upper arm 12/16/2012  . UTI (urinary tract infection) 12/16/2012  . Back pain with radiation 06/12/2012  .  Plantar wart of right foot 12/29/2011  . 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/11/2011  . Fatigue 11/11/2011  . 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 Urinary frequency 09/02/2011  . Dysuria 09/02/2011  . Medicare annual wellness visit, initial 12/18/2010  . Wears hearing aid 12/18/2010  . Medicare welcome visit 12/18/2010  . Back pain 11/23/2010  . Spinal cord tumor   . ABDOMINAL BRUIT 07/21/2009  . NEOPLASM, SKIN, UNCERTAIN BEHAVIOR  58/05/9832  . HYPERKALEMIA 05/12/2007  . ANEMIA NOS 05/12/2007  . DISORDER, STOMACH FUNCTION NEC 04/07/2007  - hx of intussecpetion   Health Maintenance Due  Topic Date Due  . MAMMOGRAM  12/24/2007  . INFLUENZA VACCINE  04/02/2017     Review of Systems Weight loss over the last summer ( #8-10), no regained weight but abdominal discomfort is improved.  Physical Exam   BP 99/60   Pulse 90   Temp 97.8 F (36.6 C) (Oral)   Ht 5\' 1"  (1.549 m)   Wt 98 lb (44.5 kg)   LMP  (LMP Unknown)   BMI 18.52 kg/m   Physical Exam  Constitutional:  oriented to person, place, and time. appears well-developed and well-nourished. No distress.  HENT: [place]/AT, PERRLA, no scleral icterus Mouth/Throat: Oropharynx is clear and moist. No oropharyngeal exudate.  Cardiovascular: Normal rate, regular rhythm and normal heart sounds. Exam reveals no gallop and no friction rub.  No murmur heard.  Pulmonary/Chest: Effort normal and breath sounds normal. No respiratory distress.  has no wheezes.  Neck = supple, no nuchal rigidity Abdominal: Soft. Bowel sounds are normal.  exhibits no distension. There is no tenderness.  Lymphadenopathy: no cervical adenopathy. No axillary adenopathy Neurological: alert and oriented to person, place, and time.  Skin: Skin is warm and dry. No rash noted. No erythema.  Psychiatric: a normal mood and affect.  behavior is normal.    CBC Lab Results  Component Value Date   WBC 8.3 02/19/2017   RBC 3.04 (L) 08/09/2016   HGB 12.2 02/19/2017   HCT 36 02/19/2017   PLT 297 02/19/2017   MCV 95.7 08/09/2016   MCH 32.9 08/09/2016   MCHC 34.4 08/09/2016   RDW 13.6 08/09/2016   LYMPHSABS 1.8 08/09/2016   MONOABS 0.4 08/09/2016   EOSABS 0.0 08/09/2016    BMET Lab Results  Component Value Date   NA 133 (A) 02/19/2017   K 5.1 02/19/2017   CL 96 (L) 08/09/2016   CO2 24 08/09/2016   GLUCOSE 111 (H) 08/09/2016   BUN 16 02/19/2017   CREATININE 0.8 02/19/2017   CALCIUM 9.3  08/09/2016   GFRNONAA >60 08/09/2016   GFRAA >60 08/09/2016      Assessment and Plan  Recurrent uti = [facility] to see what they have been treating with which abtx regimen. I suspect since she had to resort to iv therapy, that she may have ESBL or CRE MDRO. I am concern that some of her daily symptoms are not necessarily UTI.   For now we will try to switch her estradiol cream to an intravaginal pill to see if any change in symptoms. Will give urine cx cups incase she has further symptoms that need to be treated

## 2017-04-21 ENCOUNTER — Other Ambulatory Visit: Payer: Self-pay

## 2017-04-21 ENCOUNTER — Other Ambulatory Visit: Payer: Medicare Other

## 2017-04-21 DIAGNOSIS — R300 Dysuria: Secondary | ICD-10-CM

## 2017-04-21 NOTE — Progress Notes (Unsigned)
Patient left sample for culture.   Laverle Patter, RN

## 2017-04-22 LAB — URINALYSIS
Bilirubin Urine: NEGATIVE
Glucose, UA: NEGATIVE
Hgb urine dipstick: NEGATIVE
Ketones, ur: NEGATIVE
Leukocytes, UA: NEGATIVE
Nitrite: NEGATIVE
Protein, ur: NEGATIVE
Specific Gravity, Urine: 1.013 (ref 1.001–1.035)
pH: 5.5 (ref 5.0–8.0)

## 2017-04-24 ENCOUNTER — Ambulatory Visit (INDEPENDENT_AMBULATORY_CARE_PROVIDER_SITE_OTHER): Payer: Medicare Other

## 2017-04-24 VITALS — BP 118/60 | HR 80 | Ht 61.5 in | Wt 100.3 lb

## 2017-04-24 DIAGNOSIS — Z0000 Encounter for general adult medical examination without abnormal findings: Secondary | ICD-10-CM

## 2017-04-24 LAB — CULTURE, URINE COMPREHENSIVE

## 2017-04-24 NOTE — Patient Instructions (Addendum)
Kaitlyn Flowers , Thank you for taking time to come for your Medicare Wellness Visit. I appreciate your ongoing commitment to your health goals. Please review the following plan we discussed and let me know if I can assist you in the future.   Keep doing pilates!!!  Will take flu vaccine later Keep in mind the flu shot is an inactivated vaccine and takes at least 2 weeks to build immunity. The flu virus can be dormant for 4 days prior to symptoms Taking the flu shot at the beginning of the season can reduce the risk for the entire community.      These are the goals we discussed: Goals    . patient          To enjoy life and continue to research options        This is a list of the screening recommended for you and due dates:  Health Maintenance  Topic Date Due  . Flu Shot  04/02/2017  . Tetanus Vaccine  12/17/2020  . Colon Cancer Screening  02/06/2021  . DEXA scan (bone density measurement)  Completed  . Pneumonia vaccines  Completed   Summary: Preventive Care for Adults  A healthy lifestyle and preventive care can promote health and wellness. Preventive health guidelines for adults include the following key practices.  . A routine yearly physical is a good way to check with your health care provider about your health and preventive screening. It is a chance to share any concerns and updates on your health and to receive a thorough exam.  . Visit your dentist for a routine exam and preventive care every 6 months. Brush your teeth twice a day and floss once a day. Good oral hygiene prevents tooth decay and gum disease.  . The frequency of eye exams is based on your age, health, family medical history, use  of contact lenses, and other factors. Follow your health care provider's ecommendations for frequency of eye exams.  . Eat a healthy diet. Foods like vegetables, fruits, whole grains, low-fat dairy products, and lean protein foods contain the nutrients you need without  too many calories. Decrease your intake of foods high in solid fats, added sugars, and salt. Eat the right amount of calories for you. Get information about a proper diet from your health care provider, if necessary.  . Regular physical exercise is one of the most important things you can do for your health. Most adults should get at least 150 minutes of moderate-intensity exercise (any activity that increases your heart rate and causes you to sweat) each week. In addition, most adults need muscle-strengthening exercises on 2 or more days a week.  Silver Sneakers may be a benefit available to you. To determine eligibility, you may visit the website: www.silversneakers.com or contact program at (312) 193-6770 Mon-Fri between 8AM-8PM.   . Maintain a healthy weight. The body mass index (BMI) is a screening tool to identify possible weight problems. It provides an estimate of body fat based on height and weight. Your health care provider can find your BMI and can help you achieve or maintain a healthy weight.   For adults 20 years and older: ? A BMI below 18.5 is considered underweight. ? A BMI of 18.5 to 24.9 is normal. ? A BMI of 27 to 28 is considered normal by the Institutes of Health  ? A BMI of 30 and above is considered obese.   . Maintain normal blood lipids and cholesterol levels by exercising  and minimizing your intake of saturated fat. Eat a balanced diet with plenty of fruit and vegetables. Blood tests for lipids and cholesterol should begin at age 54 and be repeated every 5 years. If your lipid or cholesterol levels are high, you are over 50, or you are at high risk for heart disease, you may need your cholesterol levels checked more frequently. Ongoing high lipid and cholesterol levels should be treated with medicines if diet and exercise are not working.  . If you smoke, find out from your health care provider how to quit. If you do not use tobacco, please do not start.  . If  you choose to drink alcohol, please do not consume more than one drink for women and 2 for men.  One drink is considered to be 12 ounces (355 mL) of beer, 5 ounces (148 mL) of wine, or 1.5 ounces (44 mL) of liquor. Moderation of alcohol intake to this level decreases your risk of breast cancer and liver damage.   . If you are 22-30 years old, ask your health care provider if you should take aspirin to prevent strokes.  . Use sunscreen. Apply sunscreen liberally and repeatedly throughout the day. You should seek shade when your shadow is shorter than you. Protect yourself by wearing long sleeves, pants, a wide-brimmed hat, and sunglasses year round, whenever you are outdoors.  . Once a month, do a whole body skin exam, using a mirror to look at the skin on your back. Tell your health care provider of new moles, moles that have irregular borders, moles that are larger than a pencil eraser, or moles that have changed in shape or color.  Last, if you have completed an Advanced Directive; please bring a copy and review with your physician and then we will scan to the medical record    Fall Prevention in the [place] can cause injuries and can affect people from all age groups. There are many simple things that you can do to make your home safe and to help prevent falls. What can I do on the outside of my home?  Regularly repair the edges of walkways and driveways and fix any cracks.  Remove high doorway thresholds.  Trim any shrubbery on the main path into your home.  Use bright outdoor lighting.  Clear walkways of debris and clutter, including tools and rocks.  Regularly check that handrails are securely fastened and in good repair. Both sides of any steps should have handrails.  Install guardrails along the edges of any raised decks or porches.  Have leaves, snow, and ice cleared regularly.  Use sand or salt on walkways during winter months.  In the garage, clean up any spills  right away, including grease or oil spills. What can I do in the bathroom?  Use night lights.  Install grab bars by the toilet and in the tub and shower. Do not use towel bars as grab bars.  Use non-skid mats or decals on the floor of the tub or shower.  If you need to sit down while you are in the shower, use a plastic, non-slip stool.  Keep the floor dry. Immediately clean up any water that spills on the floor.  Remove soap buildup in the tub or shower on a regular basis.  Attach bath mats securely with double-sided non-slip rug tape.  Remove throw rugs and other tripping hazards from the floor. What can I do in the bedroom?  Use night lights.  Make  sure that a bedside light is easy to reach.  Do not use oversized bedding that drapes onto the floor.  Have a firm chair that has side arms to use for getting dressed.  Remove throw rugs and other tripping hazards from the floor. What can I do in the kitchen?  Clean up any spills right away.  Avoid walking on wet floors.  Place frequently used items in easy-to-reach places.  If you need to reach for something above you, use a sturdy step stool that has a grab bar.  Keep electrical cables out of the way.  Do not use floor polish or wax that makes floors slippery. If you have to use wax, make sure that it is non-skid floor wax.  Remove throw rugs and other tripping hazards from the floor. What can I do in the stairways?  Do not leave any items on the stairs.  Make sure that there are handrails on both sides of the stairs. Fix handrails that are broken or loose. Make sure that handrails are as long as the stairways.  Check any carpeting to make sure that it is firmly attached to the stairs. Fix any carpet that is loose or worn.  Avoid having throw rugs at the top or bottom of stairways, or secure the rugs with carpet tape to prevent them from moving.  Make sure that you have a light switch at the top of the stairs and  the bottom of the stairs. If you do not have them, have them installed. What are some other fall prevention tips?  Wear closed-toe shoes that fit well and support your feet. Wear shoes that have rubber soles or low heels.  When you use a stepladder, make sure that it is completely opened and that the sides are firmly locked. Have someone hold the ladder while you are using it. Do not climb a closed stepladder.  Add color or contrast paint or tape to grab bars and handrails in your home. Place contrasting color strips on the first and last steps.  Use mobility aids as needed, such as canes, walkers, scooters, and crutches.  Turn on lights if it is dark. Replace any light bulbs that burn out.  Set up furniture so that there are clear paths. Keep the furniture in the same spot.  Fix any uneven floor surfaces.  Choose a carpet design that does not hide the edge of steps of a stairway.  Be aware of any and all pets.  Review your medicines with your healthcare provider. Some medicines can cause dizziness or changes in blood pressure, which increase your risk of falling. Talk with your health care provider about other ways that you can decrease your risk of falls. This may include working with a physical therapist or trainer to improve your strength, balance, and endurance. This information is not intended to replace advice given to you by your health care provider. Make sure you discuss any questions you have with your health care provider. Document Released: 08/09/2002 Document Revised: 01/16/2016 Document Reviewed: 09/23/2014 Elsevier Interactive Patient Education  2017 [employer].

## 2017-04-24 NOTE — Progress Notes (Signed)
Subjective:   Kaitlyn Flowers is a 76 y.o. female who presents for Medicare Annual/Subsequent preventive examination.  The Patient was informed that the wellness visit is to identify future health risk and educate and initiate measures that can reduce risk for increased disease through the lifespan.    Annual Wellness Assessment  Reports health as challenging Lost her wife x 2 yo  Has chronic back pain since 2013 spinal surgery  Tried the spinal cord stim Takes a low dose of tramadol   Going back to spinal doctor tomorrow and will ask questions for further tx   Sleeps on her stomach but cannot sleep on her back   Has frequent UTI    Preventive Screening -Counseling & Management  Medicare Annual Preventive Care Visit - Subsequent Last OV Panosh 01/2017 and labs at that time   Colonoscopy 04/2011  Repeat 01/2021 Mammogram 09/2005 - decided to hold for now Dexa 01/2016 -Also goes to [facility]; started taking Vit D  Monitors her sodium    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  She does pilates which does help her, but they are modified   VS reviewed;   Diet  Vegetarian diet long term  Eats fish   BMI 18  Exercise Thwarted now due to back pain but still enjoys  pilates   States she has Good support systems Close friends and son in town     Cardiac Risk Factors Addressed Hyperlipidemia - chol /hdl ratio 2; LDL 114; hdl 111; trig 78  Diabetes neg  Was taking [facility] wart that helps her get out of "slump"   Has stairs in her home and climbs one step at a time The home is big enough to move downstairs if she needs to  No plans to move as yet   Advanced Directives completed   Patient Care Team: Panosh, Standley Brooking, MD as PCP - Lisa Roca, MD (Neurosurgery) Carlyle Basques, MD as Consulting Physician (Infectious Diseases) Assessed for additional  providers  Immunization History  Administered Date(s) Administered  . Influenza Split 05/29/2012  . Influenza Whole 06/30/2007  . Influenza, High Dose Seasonal PF 07/18/2014, 05/23/2015, 06/21/2016  . Pneumococcal Conjugate-13 06/21/2016  . Pneumococcal Polysaccharide-23 12/18/2010  . Tdap 12/18/2010  . Zoster 06/12/2012   Required Immunizations needed today  Screening test up to date or reviewed for plan of completion Health Maintenance Due  Topic Date Due  . INFLUENZA VACCINE  04/02/2017    Declined flu vaccine to day       Objective:    Vitals: BP 118/60   Pulse 80   Ht 5' 1.5" (1.562 m)   Wt 100 lb 5 oz (45.5 kg)   LMP  (LMP Unknown)   SpO2 96%   BMI 18.65 kg/m   Body mass index is 18.65 kg/m.  Tobacco History  Smoking Status  . Former Smoker  . Packs/day: 0.25  . Years: 2.00  . Types: Cigarettes  . Quit date: 09/03/1967  Smokeless Tobacco  . Never Used    Comment: quit 48 years      Counseling given: Yes   Past Medical History:  Diagnosis Date  . Anemia   . Osteopenia   . PONV (postoperative nausea and vomiting)   . Seizures ([place])   . 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  . Wears hearing aid    Past Surgical History:  Procedure Laterality Date  . ABDOMINAL HYSTERECTOMY  1973   1/2 of ovary left cyst on ovary  . APPENDECTOMY     removed with hysterectomy  . LUMBAR WOUND DEBRIDEMENT  12/06/2011   Procedure: LUMBAR WOUND DEBRIDEMENT;  Surgeon: Kristeen Miss, MD;  Location: [facility];  Service: Neurosurgery;  Laterality: N/A;  Repair of Pseudomeningocele Lumbar wound  . ROTATOR CUFF REPAIR     left   . shoulder rotator culff   2006  . spinal cord tumor  1987   2012 october    Family History  Problem Relation Age of Onset  . Stroke Mother        2010  . Heart attack Father   . Anesthesia problems Neg Hx    History  Sexual Activity  . Sexual activity: Not on file    Outpatient Encounter Prescriptions as of  04/24/2017  Medication Sig  . aspirin EC 325 MG tablet Take 1 tablet (325 mg total) by mouth daily.  Marland Kitchen b complex vitamins capsule Take 1 capsule by mouth daily.  Marland Kitchen CALCIUM PO Take 1,000 Units by mouth daily.   Marland Kitchen CRANBERRY PO Take by mouth 3 (three) times daily.  Marland Kitchen ESTRACE VAGINAL 0.1 MG/GM vaginal cream USE SMALL AMOUNT VAGINALLY 2-3 TIMES PER WEEK  . Estradiol 10 MCG TABS vaginal tablet Place 1 tablet (10 mcg total) vaginally 2 (two) times a week.  . lamoTRIgine (LAMICTAL) 25 MG tablet Take 1 tablet (25 mg total) by mouth daily.  Marland Kitchen MAGNESIUM PO Take 1,200 mg by mouth every evening.  . Meth-Hyo-M Bl-Na Phos-Ph Sal (URO-MP) 118 MG CAPS Take 118 mg by mouth daily as needed. UTI symptoms  . Omega-3 Fatty Acids (FISH OIL PO) Take by mouth 2 (two) times daily.  . Probiotic Product (PROBIOTIC PO) Take by mouth 2 (two) times daily.  Marland Kitchen RAPAFLO 8 MG CAPS capsule Take 8 mg by mouth every evening.  . traMADol (ULTRAM) 50 MG tablet   . butalbital-acetaminophen-caffeine (FIORICET, ESGIC) 50-325-40 MG tablet Take 1 tablet as needed twice a day for headache. Do not take more than 2-3 times a week (Patient not taking: Reported on 04/24/2017)   No facility-administered encounter medications on file as of 04/24/2017.     Activities of Daily Living In your present state of health, do you have any difficulty performing the following activities: 04/24/2017 10/31/2016  Hearing? N N  Vision? N N  Difficulty concentrating or making decisions? N N  Walking or climbing stairs? Y Y  Dressing or bathing? N N  Doing errands, shopping? N -  Preparing Food and eating ? N -  Using the Toilet? N -  In the past six months, have you accidently leaked urine? Y -  Do you have problems with loss of bowel control? N -  Managing your Medications? N -  Managing your Finances? N -  Housekeeping or managing your Housekeeping? N -  Some recent data might be hidden    Patient Care Team: Panosh, Standley Brooking, MD as PCP -  Lisa Roca, MD (Neurosurgery) Carlyle Basques, MD as Consulting Physician (Infectious Diseases)   Assessment:     Exercise Activities and Dietary recommendations Continue pililates   Current Exercise Habits: Structured exercise class, Time (Minutes): 45 (does pilates ), Frequency (Times/Week): 3, Weekly Exercise (Minutes/Week): 135  Goals    . patient          To  enjoy life and continue to research options       Fall Risk Fall Risk  04/24/2017 09/09/2016 07/22/2016 07/10/2016 05/29/2016  Falls in the past year? Yes Yes Yes No Yes  Number falls in past yr: 1 1 - - 1  Injury with Fall? - No - - No  Follow up Education provided - - - -   Depression Screen PHQ 2/9 Scores 04/24/2017 11/20/2015 11/13/2015 11/08/2015  PHQ - 2 Score 1 1 0 0  PHQ- 9 Score - 1 - -    Cognitive Function     Ad8 score reviewed for issues:  Issues making decisions:  Less interest in hobbies / activities:  Repeats questions, stories (family complaining):  Trouble using ordinary gadgets (microwave, computer, phone):  Forgets the month or year:   Mismanaging finances:   Remembering appts:  Daily problems with thinking and/or memory: Ad8 score is=0        Immunization History  Administered Date(s) Administered  . Influenza Split 05/29/2012  . Influenza Whole 06/30/2007  . Influenza, High Dose Seasonal PF 07/18/2014, 05/23/2015, 06/21/2016  . Pneumococcal Conjugate-13 06/21/2016  . Pneumococcal Polysaccharide-23 12/18/2010  . Tdap 12/18/2010  . Zoster 06/12/2012   Screening Tests Health Maintenance  Topic Date Due  . INFLUENZA VACCINE  04/02/2017  . TETANUS/TDAP  12/17/2020  . COLONOSCOPY  02/06/2021  . DEXA SCAN  Completed  . PNA vac Low Risk Adult  Completed      Plan:     PCP Notes  Health Maintenance Declined flu vaccine Postponing mammogram due to pain in back Dexa completed 2017;    Abnormal Screens  none  Referrals  none  Patient concerns; Had  injection x 2 to back, the 2nd one seemed to irritate the nerve and she is going back to the spinal doctor tomorrow   Nurse Concerns; Managing with back pain;   Next PCP apt As needed       I have personally reviewed and noted the following in the patient's chart:   . Medical and social history . Use of alcohol, tobacco or illicit drugs  . Current medications and supplements . Functional ability and status . Nutritional status . Physical activity . Advanced directives . List of other physicians . Hospitalizations, surgeries, and ER visits in previous 12 months . Vitals . Screenings to include cognitive, depression, and falls . Referrals and appointments  In addition, I have reviewed and discussed with patient certain preventive protocols, quality metrics, and best practice recommendations. A written personalized care plan for preventive services as well as general preventive health recommendations were provided to patient.     Wynetta Fines, RN  04/24/2017

## 2017-04-25 ENCOUNTER — Telehealth: Payer: Self-pay | Admitting: *Deleted

## 2017-04-25 NOTE — Progress Notes (Signed)
Signed in PCP absence.  Colin Benton R., DO

## 2017-04-25 NOTE — Telephone Encounter (Signed)
Please advise on patient's urine results from 8/20.  She is heading out of town, would like results first. Lavone Neri, Lanice Schwab, RN

## 2017-04-28 NOTE — Telephone Encounter (Signed)
I tried calling but couldn't get through. I left a message previously to her calling. Can you call her back and let her know that her UA did not show any signs of infection. Her urine culture identified coagulase negative staph which is a bacteria that is commonly found in urinary tract that is not thought to cause infection. At this time, I did not think she needs to be given abtx

## 2017-04-29 NOTE — Telephone Encounter (Signed)
Left message with results and Dr Storm Frisk interpretation and advice. Landis Gandy, RN

## 2017-05-13 ENCOUNTER — Ambulatory Visit (INDEPENDENT_AMBULATORY_CARE_PROVIDER_SITE_OTHER): Payer: Medicare Other | Admitting: Internal Medicine

## 2017-05-13 ENCOUNTER — Encounter: Payer: Self-pay | Admitting: Internal Medicine

## 2017-05-13 VITALS — BP 140/80 | HR 74 | Temp 97.9°F | Wt 101.8 lb

## 2017-05-13 DIAGNOSIS — Z87440 Personal history of urinary (tract) infections: Secondary | ICD-10-CM

## 2017-05-13 DIAGNOSIS — I998 Other disorder of circulatory system: Secondary | ICD-10-CM | POA: Diagnosis not present

## 2017-05-13 NOTE — Progress Notes (Signed)
Chief Complaint  Patient presents with  . Hypertension    HPI: Kaitlyn Flowers 76 y.o. SDA  Today    " problems with BP"  She has been noted to have some labile pressure readings seen in the past by cardiology not felt to be significant for medication felt to be related to UTIs and her seizure episode. However recently she felt a little "wonky"with such things as headaches in the morning so she started checking her blood pressure and was felt to beup in the 150 range Previously it had been fine and she wasn't checking it and not on medication.  Felt off and so took  Medication.  Tramadol.half of the 50 mg 3 times a day  No change in  Dosing of meds.  150/90   With her last reading. She hasn't been back to the neurologist recently wonders if she should still stay on the Lamictal.She is seeing in the infectious disease clinic in regard to her recurrent UTIs trying to avoid multiple antibiotics. She is no longer doing self caths. Is recently did not seem to make a difference. ROS: See pertinent positives and negatives per HPI.  Past Medical History:  Diagnosis Date  . Anemia   . Osteopenia   . PONV (postoperative nausea and vomiting)   . Seizures ([place])   . 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  . Wears hearing aid     Family History  Problem Relation Age of Onset  . Stroke Mother        2010  . Heart attack Father   . Anesthesia problems Neg Hx     Social History   Social History  . Marital status: Legally Separated    Spouse name: N/A  . Number of children: N/A  . Years of education: N/A   Social History Main Topics  . Smoking status: Former Smoker    Packs/day: 0.25    Years: 2.00    Types: Cigarettes    Quit date: 09/03/1967  . Smokeless tobacco: Never Used     Comment: quit 48 years   . Alcohol use 2.4 oz/week    4 Glasses of wine per week     Comment: have a glass of wine with dinner   . Drug use: No  . Sexual activity: Not Asked     Other Topics Concern  . 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 aspirin EC 325 MG tablet Take 1 tablet (325 mg total) by mouth daily. 30 tablet 11  . b complex vitamins capsule Take 1 capsule by mouth daily.    . butalbital-acetaminophen-caffeine (FIORICET, ESGIC) 50-325-40 MG tablet Take 1 tablet as needed twice a day for headache. Do not take more than 2-3 times a week 10 tablet 4  . CALCIUM PO Take 1,000 Units by mouth daily.     Marland Kitchen CRANBERRY PO Take by mouth 3 (three) times daily.    Marland Kitchen ESTRACE VAGINAL 0.1 MG/GM vaginal cream USE SMALL AMOUNT VAGINALLY 2-3 TIMES PER WEEK  0  . Estradiol 10 MCG TABS vaginal tablet Place 1 tablet (10 mcg total) vaginally 2 (two) times a week. 8 tablet 11  . lamoTRIgine (LAMICTAL) 25 MG tablet Take 1 tablet (25 mg  total) by mouth daily. 90 tablet 1  . MAGNESIUM PO Take 1,200 mg by mouth every evening.    . Meth-Hyo-M Bl-Na Phos-Ph Sal (URO-MP) 118 MG CAPS Take 118 mg by mouth daily as needed. UTI symptoms  3  . Omega-3 Fatty Acids (FISH OIL PO) Take by mouth 2 (two) times daily.    . Probiotic Product (PROBIOTIC PO) Take by mouth 2 (two) times daily.    Marland Kitchen RAPAFLO 8 MG CAPS capsule Take 8 mg by mouth every evening.    . traMADol (ULTRAM) 50 MG tablet      No facility-administered medications prior to visit.      EXAM:  BP 140/80 (BP Location: Right Arm, Patient Position: Sitting, Cuff Size: Normal)   Pulse 74   Temp 97.9 F (36.6 C) (Oral)   Wt 101 lb 12.8 oz (46.2 kg)   LMP  (LMP Unknown)   BMI 18.92 kg/m   Body mass index is 18.92 kg/m. repeat blood pressure sitting right arm 138/60. GENERAL: vitals reviewed and listed above, alert, oriented, appears well hydrated and in no acute distress she looks well for her and nontoxic prefers to stand over sitting  long periods HEENT: atraumatic, conjunctiva  clear, no obvious abnormalities on inspection of external nose and ears  NECK: no obvious masses on inspection palpation  LUNGS: clear to auscultation bilaterally, no wheezes, rales or rhonchi, good air movement CV: HRRR, no clubbing cyanosis or  peripheral edema nl cap refill  MS: moves all extremities without noticeable focal  abnormality PSYCH: pleasant and cooperative, no obvious depression or anxiety BP Readings from Last 3 Encounters:  05/13/17 140/80  04/24/17 118/60  04/17/17 99/60   Lab Results  Component Value Date   WBC 8.3 02/19/2017   HGB 12.2 02/19/2017   HCT 36 02/19/2017   PLT 297 02/19/2017   GLUCOSE 111 (H) 08/09/2016   CHOL 241 (H) 04/02/2016   TRIG 78.0 04/02/2016   HDL 111.30 04/02/2016   LDLDIRECT 110.1 11/27/2010   LDLCALC 114 (H) 04/02/2016   ALT 11 02/19/2017   AST 14 02/19/2017   NA 133 (A) 02/19/2017   K 5.1 02/19/2017   CL 96 (L) 08/09/2016   CREATININE 0.8 02/19/2017   BUN 16 02/19/2017   CO2 24 08/09/2016   TSH 1.28 02/13/2016   INR 1.01 03/25/2016   Wt Readings from Last 3 Encounters:  05/13/17 101 lb 12.8 oz (46.2 kg)  04/24/17 100 lb 5 oz (45.5 kg)  04/17/17 98 lb (44.5 kg)    ASSESSMENT AND PLAN:  Discussed the following assessment and plan:  Fluctuating blood pressure  Hx: UTI (urinary tract infection) Do not advise adding a blood pressure antihypertensive medicine her fluctuating blood pressure may be a secondary phenomenon. She should monitor blood pressure for 2 weeks including symptoms of concern such as headache and get appointment with neurology question if medications could be adding a problem. If future she remains high we can see her back for further intervention.she can send in her readings my chart. In the past she was put on antihypertensive medicines and her blood pressure went too low. She has a history of hyponatremia did not check today if ongoing may need to be rechecked  by her endocrinology team. -Patient advised to return or notify health care team  if symptoms worsen ,persist or new concerns arise.  Patient Instructions  Take blood pressure readings twice a day for 14  days and then periodically . And send in readings  .  But no medication for now  Poss effect of your feeling and not the cause .   Monitor if headaches other problems .    consideration  Of possible side effects of meds?    Advise     See neurology  .  Make sure  You are following up with endocrinology .   Your repeat BP  Is    Better on repeat 138/62     Standley Brooking. Panosh M.D.

## 2017-05-13 NOTE — Patient Instructions (Addendum)
Take blood pressure readings twice a day for 14  days and then periodically . And send in readings  .  But no medication for now  Poss effect of your feeling and not the cause .   Monitor if headaches other problems .    consideration  Of possible side effects of meds?    Advise     See neurology  .  Make sure  You are following up with endocrinology .   Your repeat BP  Is    Better on repeat 138/62

## 2017-05-26 ENCOUNTER — Ambulatory Visit: Payer: Medicare Other | Admitting: Internal Medicine

## 2017-06-02 ENCOUNTER — Other Ambulatory Visit: Payer: Self-pay | Admitting: Neurology

## 2017-06-03 ENCOUNTER — Encounter: Payer: Self-pay | Admitting: Internal Medicine

## 2017-06-03 ENCOUNTER — Ambulatory Visit (INDEPENDENT_AMBULATORY_CARE_PROVIDER_SITE_OTHER): Payer: Medicare Other | Admitting: Internal Medicine

## 2017-06-03 VITALS — BP 129/73 | HR 74 | Temp 98.1°F | Ht 61.5 in | Wt 100.0 lb

## 2017-06-03 DIAGNOSIS — N390 Urinary tract infection, site not specified: Secondary | ICD-10-CM | POA: Diagnosis not present

## 2017-06-03 DIAGNOSIS — Z23 Encounter for immunization: Secondary | ICD-10-CM | POA: Diagnosis not present

## 2017-06-03 NOTE — Progress Notes (Signed)
RFV :follow up for recurrent uti  Patient ID: Kaitlyn Flowers, female   DOB: 09/03/1940, 76 y.o.   MRN: [id number]  HPI  Kaitlyn Flowers reports feeling better with cutting back on self catheterization as well as using estrodiol vaginal tabs. Has only needed to self cath 2x since last visit. She also reports that she had what sounds like gastritis. Taking ppi as needed to help symptoms. She had associated weight loss of #5 that she has yet to gain back. Also followed by dr Regis Bill her pcp and dr buccini [facility]. Outpatient Encounter Prescriptions as of 06/03/2017  Medication Sig  . b complex vitamins capsule Take 1 capsule by mouth daily.  Marland Kitchen CALCIUM PO Take 1,000 Units by mouth daily.   Marland Kitchen CRANBERRY PO Take by mouth 3 (three) times daily.  . Estradiol 10 MCG TABS vaginal tablet Place 1 tablet (10 mcg total) vaginally 2 (two) times a week.  . lamoTRIgine (LAMICTAL) 25 MG tablet Take 1 tablet (25 mg total) by mouth daily.  Marland Kitchen MAGNESIUM PO Take 1,200 mg by mouth every evening.  . Meth-Hyo-M Bl-Na Phos-Ph Sal (URO-MP) 118 MG CAPS Take 118 mg by mouth daily as needed. UTI symptoms  . Omega-3 Fatty Acids (FISH OIL PO) Take by mouth 2 (two) times daily.  . Probiotic Product (PROBIOTIC PO) Take by mouth 2 (two) times daily.  Marland Kitchen RAPAFLO 8 MG CAPS capsule Take 8 mg by mouth every evening.  . traMADol (ULTRAM) 50 MG tablet   . butalbital-acetaminophen-caffeine (FIORICET, ESGIC) 50-325-40 MG tablet Take 1 tablet as needed twice a day for headache. Do not take more than 2-3 times a week (Patient not taking: Reported on 06/03/2017)  . [employer] ASPIRIN EC 325 MG EC tablet TAKE 1 TABLET (325 MG TOTAL) BY MOUTH DAILY. (Patient not taking: Reported on 06/03/2017)  . ESTRACE VAGINAL 0.1 MG/GM vaginal cream USE SMALL AMOUNT VAGINALLY 2-3 TIMES PER WEEK  . [DISCONTINUED] LYRICA 50 MG capsule TAKE 1 CAPSULE AT BEDTIME X2DAYS THEN TWICE DAILY X2 DAYS THEN 3 TIMES DAILY   No facility-administered encounter medications on file  as of 06/03/2017.      Patient Active Problem List   Diagnosis Date Noted  . Labile hypertension 09/27/2016  . Autonomic dysfunction 09/27/2016  . Chronic pain syndrome 09/10/2016  . Localization-related idiopathic epilepsy and epileptic syndromes with seizures of localized onset, not intractable, without status epilepticus ([place]) 08/01/2016  . Hyponatremia 04/01/2016  . Aphasia   . Abdominal cramping 01/22/2016  . Nocturnal leg cramps 12/07/2015  . UTI (lower urinary tract infection) 10/02/2015  . 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 Sacroiliitis ([place]) 06/06/2014  . Lumbar radicular pain 06/06/2014  . Right low back pain 01/07/2014  . Anemia 01/07/2014  . Osteoporosis 12/26/2013  . Glucosuria 12/16/2012  . Tick bite of upper arm 12/16/2012  . UTI (urinary tract infection) 12/16/2012  . Back pain with radiation 06/12/2012  . Plantar wart of right foot 12/29/2011  . 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/11/2011  . Fatigue 11/11/2011  . 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 Urinary frequency 09/02/2011  . Dysuria 09/02/2011  . Medicare annual wellness visit, initial 12/18/2010  . Wears hearing aid 12/18/2010  . Medicare welcome visit 12/18/2010  . Back pain 11/23/2010  . Spinal cord tumor   . ABDOMINAL BRUIT 07/21/2009  . NEOPLASM, SKIN, UNCERTAIN BEHAVIOR 60/73/7106  . HYPERKALEMIA 05/12/2007  . ANEMIA NOS 05/12/2007  . DISORDER, STOMACH FUNCTION NEC 04/07/2007  Health Maintenance Due  Topic Date Due  . INFLUENZA VACCINE  04/02/2017     Review of Systems  Constitutional: Negative for fever, chills, diaphoresis, activity change, appetite change, fatigue and unexpected weight change.  HENT: Negative for congestion, sore throat, rhinorrhea, sneezing, trouble swallowing and sinus pressure.  Eyes: Negative for photophobia and visual disturbance.  Respiratory: Negative for cough, chest tightness, shortness of breath,  wheezing and stridor.  Cardiovascular: Negative for chest pain, palpitations and leg swelling.  Gastrointestinal: Negative for nausea, vomiting, abdominal pain, diarrhea, constipation, blood in stool, abdominal distention and anal bleeding.  Genitourinary: Negative for dysuria, hematuria, flank pain and difficulty urinating.  Musculoskeletal: chronic low back pain Skin: Negative for color change, pallor, rash and wound.  Neurological: Negative for dizziness, tremors, weakness and light-headedness.  Hematological: Negative for adenopathy. Does not bruise/bleed easily.  Psychiatric/Behavioral: Negative for behavioral problems, confusion, sleep disturbance, dysphoric mood, decreased concentration and agitation.    Physical Exam   BP 129/73   Pulse 74   Temp 98.1 F (36.7 C) (Oral)   Ht 5' 1.5" (1.562 m)   Wt 100 lb (45.4 kg)   LMP  (LMP Unknown)   BMI 18.59 kg/m   Did not examine CBC Lab Results  Component Value Date   WBC 8.3 02/19/2017   RBC 3.04 (L) 08/09/2016   HGB 12.2 02/19/2017   HCT 36 02/19/2017   PLT 297 02/19/2017   MCV 95.7 08/09/2016   MCH 32.9 08/09/2016   MCHC 34.4 08/09/2016   RDW 13.6 08/09/2016   LYMPHSABS 1.8 08/09/2016   MONOABS 0.4 08/09/2016   EOSABS 0.0 08/09/2016    BMET Lab Results  Component Value Date   NA 133 (A) 02/19/2017   K 5.1 02/19/2017   CL 96 (L) 08/09/2016   CO2 24 08/09/2016   GLUCOSE 111 (H) 08/09/2016   BUN 16 02/19/2017   CREATININE 0.8 02/19/2017   CALCIUM 9.3 08/09/2016   GFRNONAA >60 08/09/2016   GFRAA >60 08/09/2016      Assessment and Plan  Recurrent uti = stable, no symptoms presently. Will give extra urine specimen  Health maintenance = will give flu vac

## 2017-06-06 ENCOUNTER — Encounter: Payer: Self-pay | Admitting: Neurology

## 2017-06-06 ENCOUNTER — Ambulatory Visit (INDEPENDENT_AMBULATORY_CARE_PROVIDER_SITE_OTHER): Payer: Medicare Other | Admitting: Neurology

## 2017-06-06 VITALS — BP 130/82 | HR 78 | Ht 61.5 in | Wt 102.0 lb

## 2017-06-06 DIAGNOSIS — G40009 Localization-related (focal) (partial) idiopathic epilepsy and epileptic syndromes with seizures of localized onset, not intractable, without status epilepticus: Secondary | ICD-10-CM

## 2017-06-06 DIAGNOSIS — R29898 Other symptoms and signs involving the musculoskeletal system: Secondary | ICD-10-CM

## 2017-06-06 DIAGNOSIS — G44209 Tension-type headache, unspecified, not intractable: Secondary | ICD-10-CM

## 2017-06-06 MED ORDER — LAMOTRIGINE 25 MG PO TABS: 25.0000 mg | ORAL_TABLET | Freq: Every day | ORAL | 3 refills | Status: DC

## 2017-06-06 NOTE — Progress Notes (Signed)
NEUROLOGY FOLLOW UP OFFICE NOTE  Kaitlyn Flowers [id number]  HISTORY OF PRESENT ILLNESS: I had the pleasure of seeing Kaitlyn Flowers in follow-up in the neurology clinic on 06/06/2017. The patient was last seen 9 months ago. She was being seen for seizures, she had a syncopal episode on 03/03/16, then a transient episode of aphasia on 7/24 and again last 07/21/16. Stroke workup in July was unremarkable. She started taking full dose aspirin. With recurrence of transient aphasia, this time with headache, considerations included seizure versus complicated migraine. She had an EEG done which showed bilateral temporal slowing, left greater than right, at times sharply contoured. There were a few sharp transients that appeared more epileptogenic over the left frontotemporal region. She had side effects on Topamax and was switched to Lamotrigine. She has only been taking Lamotrigine 25mg  1/2 tablet every night. The full tablet was making her feel spacey. She presents today due to headaches. She reports they are better since her last visit, she has them maybe once a week or less, but she would wake up with a mild headache that resolves 30 minutes after being up. She only had to take Tylenol one time. She was concerned they were related to her BP, but it would not be elevated each time. She mostly has mild frontal pressure with no nausea/vomiting, vision changes, photo/phonophobia, no nasal congestion. She has not found any association with sleep, but sometimes she has more when she is stressed (not consistently). She has also noticed her right leg is intermittently weak. She would be doing pilates and do well sometimes but other times her right leg would not be responsive/coordinated. It can last all day sometimes. It would be this bad around once a week. In the past it would cause her to fall down stairs, she has been more careful and has not had any falls since at least March 2018. 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 She has been dealing with worse back pain and takes Tramadol regularly 1/2 tab 4 times a day for the past 3 months. Gabapentin and Lyrica put her in a stupor.  HPI 05/29/2016: This is a very pleasant 76 yo RH woman who presented after a syncopal episode on 03/03/16 then a transient episode of aphasia on 03/25/16. She lives alone and was sitting outside last 03/03/16. She recalls turning around to get water, then blacked out. She woke up on the ground and found her glasses broken, she hit the right side of her face. No tongue bite/incontinence. She denied any prodromal symptoms, no dizziness or headaches. She had been drinking a lot of water due to recurrent UTIs since January 2017. She went to the ER, it was felt fall was likely a result of orthostatic hypotension from sitting out in the heat. No bloodwork on record from that visit. Then on 03/25/16 she was reading something off her phone and could not make sense of it. She showed the phone to her friend who said it looked fine. Then she found she could not "get/grab my words." Her friend drove her to the ER, she felt her friend was driving the wrong way and she was thinking this, but could not say it. This lasted 20-30 minutes, no associated focal numbness/tingling/weakness, headaches, or dizziness. Her BP on arrival was 139/72. I personally reviewed head CT without contrast which did not show any acute changes. She was found to have a sodium level of 123. She was also diagnosed with another UTI  with >100,000 aerococcus urinae. She denies any further recurrence of symptoms. She denies any olfactory/gustatory hallucinations, deja vu, rising epigastric sensation, focal numbness/tingling/weakness, myoclonic jerks. She has chronic back pain and self-catheterizes herself every night. She has chronic right leg numbness and weakness. For the past 1-1/2 years, she has had pain the right sacral area, worsened by sitting. She continues to work with PT.     She underwent a stroke workup, I personally reviewed images available. 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 MRA head did not show any intracranial stenosis. Carotid dopplers did not show any evidence of ICA stenosis bilaterally. Her echocardiogram showed normal EF 60-65%, normal RV size and systolic function, no significant valvular abnormalities, left atrium normal size. Lipid panel showed an LDL of 114, total cholesterol 241. She started to take a daily baby aspirin.  She saw her PCP on 11/6 reporting random headaches and wondered if related to her BP. She was scheduled for an earlier appointment today, then yesterday had another transient episode of aphasia. 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 When she tried to speak, her words were jumbled. She states the aphasia lasted 30-40 minutes. A friend took her home then to her PCP office at noon, where bloodwork was done. She went to the ER at 11pm, reporting severe headache that started an hour prior. She had an intense pressure and throbbing over the left temporal region, 8/10, with nausea and vomiting. Headache lasted until this morning. No photo/phono/phonophobia. There was no aphasia noted in the ER, headache was treated with IV fluids and APAP. She denied any focal weakness or paresthesias. She denied having a headache with the previous episode of aphasia. She has no prior history of migraines. Over the past 2 weeks, she has been having random headaches localized on different parts of her head, occurring every 3-4 days, resolving after Tylenol intake.   Her mother had a stroke. Otherwise she had a normal birth and early development.  There is no history of febrile convulsions, CNS infections such as meningitis/encephalitis, significant traumatic brain injury, neurosurgical procedures, or  family history of seizures.  PAST MEDICAL HISTORY: Past Medical History:  Diagnosis Date  . Anemia   . Osteopenia   . PONV (postoperative nausea and vomiting)   . Seizures ([place])   . 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  . Wears hearing aid     MEDICATIONS: Current Outpatient Prescriptions on File Prior to Visit  Medication Sig Dispense Refill  . b complex vitamins capsule Take 1 capsule by mouth daily.    Marland Kitchen CALCIUM PO Take 1,000 Units by mouth daily.     Marland Kitchen CRANBERRY PO Take by mouth 3 (three) times daily.    . [employer] ASPIRIN EC 325 MG EC tablet TAKE 1 TABLET (325 MG TOTAL) BY MOUTH DAILY. 30 tablet 7  . ESTRACE VAGINAL 0.1 MG/GM vaginal cream USE SMALL AMOUNT VAGINALLY 2-3 TIMES PER WEEK  0  . Estradiol 10 MCG TABS vaginal tablet Place 1 tablet (10 mcg total) vaginally 2 (two) times a week. 8 tablet 11  . lamoTRIgine (LAMICTAL) 25 MG tablet Take 1 tablet (25 mg total) by mouth daily. (taking 1/2 tablet qhs) 90 tablet 1  . MAGNESIUM PO Take 1,200 mg by mouth every evening.    . Meth-Hyo-M Barnett Hatter  Phos-Ph Sal (URO-MP) 118 MG CAPS Take 118 mg by mouth daily as needed. UTI symptoms  3  . Omega-3 Fatty Acids (FISH OIL PO) Take by mouth 2 (two) times daily.    . Probiotic Product (PROBIOTIC PO) Take by mouth 2 (two) times daily.    Marland Kitchen RAPAFLO 8 MG CAPS capsule Take 8 mg by mouth every evening.    . traMADol (ULTRAM) 50 MG tablet     . butalbital-acetaminophen-caffeine (FIORICET, ESGIC) 50-325-40 MG tablet Take 1 tablet as needed twice a day for headache. Do not take more than 2-3 times a week (Patient not taking: Reported on 06/06/2017) 10 tablet 4   No current facility-administered medications on file prior to visit.     ALLERGIES: Allergies  Allergen Reactions  . Ibuprofen-Famotidine Other (See Comments)    Elevated BP  . Penicillins Rash    FAMILY HISTORY: Family History  Problem Relation Age of Onset  . Stroke Mother        2010  . Heart attack Father    . Anesthesia problems Neg Hx     SOCIAL HISTORY: Social History   Social History  . Marital status: Legally Separated    Spouse name: N/A  . Number of children: N/A  . Years of education: N/A   Occupational History  . Not on file.   Social History Main Topics  . Smoking status: Former Smoker    Packs/day: 0.25    Years: 2.00    Types: Cigarettes    Quit date: 09/03/1967  . Smokeless tobacco: Never Used     Comment: quit 48 years   . Alcohol use 2.4 oz/week    4 Glasses of wine per week     Comment: have a glass of wine with dinner   . Drug use: No  . Sexual activity: Not on file   Other Topics Concern  . 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   Born [place]: Constitutional: No fevers, chills, or sweats, no generalized fatigue, change in appetite Eyes: No visual changes, double vision, eye pain Ear, nose and throat: No hearing loss, ear pain, nasal congestion, sore throat Cardiovascular: No chest pain, palpitations Respiratory:  No shortness of breath at rest or with exertion, wheezes GastrointestinaI: No nausea, vomiting, diarrhea, abdominal pain, fecal incontinence Genitourinary:  No dysuria, urinary retention or frequency Musculoskeletal:  + neck pain, back pain Integumentary: No rash, pruritus, skin lesions Neurological: as above Psychiatric: No depression, insomnia, anxiety Endocrine: No palpitations, fatigue, diaphoresis, mood swings, change in appetite, change in weight, increased thirst Hematologic/Lymphatic:  No anemia, purpura, petechiae. Allergic/Immunologic: no itchy/runny eyes, nasal congestion, recent allergic reactions, rashes  PHYSICAL EXAM: Vitals:   06/06/17 1301  BP: 130/82  Pulse: 78  SpO2: 99%   General: No acute distress Head:  Normocephalic/atraumatic Neck: supple, no paraspinal tenderness, full range of  motion Heart:  Regular rate and rhythm Lungs:  Clear to auscultation bilaterally Back: No paraspinal tenderness Skin/Extremities: No rash, no edema.  Neurological Exam: alert and oriented to person, place, and time. No aphasia or dysarthria. Fund of knowledge is appropriate.  Recent and remote memory are intact.  Attention and concentration are normal.    Able to name objects and repeat phrases, read and write. Cranial nerves: Pupils equal, round, reactive to light.  Fundoscopic exam unremarkable, no  papilledema. Extraocular movements intact with no nystagmus. Visual fields full. Facial sensation intact. No facial asymmetry. Tongue, uvula, palate midline.  Motor: Bulk and tone normal, muscle strength 5/5 throughout with no pronator drift.  Sensation to light touch, temperature and vibration intact.  No extinction to double simultaneous stimulation.  Deep tendon reflexes +2 on both UE and left patella, unable to elicit on right patella. Toes downgoing.  Finger to nose testing intact.  Gait slow and cautious due to back pain, no ataxia. Romberg negative.  IMPRESSION: This is a pleasant 76 yo RH woman who had a syncopal episode on 03/03/16, then a transient episode of expressive aphasia last 03/25/16. Stroke workup was unremarkable, MRI brain did not show any acute changes, no significant stenosis seen intracranially or with carotid dopplers. Echo normal. She had another transient episode of aphasia on 07/21/16, this time followed by intense headache. No headache with previous event. Her EEG was abnormal with bilateral temporal slowing and rare left frontotemporal epileptiform discharges. 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 She had side effects on Topamax, currently on very low dose Lamotrigine 25mg  1/2 tab qhs with no side effects and no aphasic episodes since 07/2016. She is reporting mild headaches once a week suggestive of tension-type headaches,  neurological exam non-focal today. She is also reporting intermittent episodes of right leg incoordination, this appears to be more related to back issues rather than seizure, however we discussed increasing Lamotrigine dose to 25mg  qhs and monitor if headaches improve and right leg episodes lessen. We discussed how Tramadol can lower seizure threshold in certain patients, she was advised to minimize as much as she can and monitor for any seizures. She is aware of [place] driving laws to stop driving after a seizure until 6 months seizure-free. She will follow-up in 4-5 months.   Thank you for allowing me to participate in her care.  Please do not hesitate to call for any questions or concerns.  The duration of this appointment visit was 25 minutes of face-to-face time with the patient.  Greater than 50% of this time was spent in counseling, explanation of diagnosis, planning of further management, and coordination of care.   Ellouise Newer, M.D.   CC: Dr. Regis Bill

## 2017-06-06 NOTE — Patient Instructions (Signed)
1. Increase Lamictal 25mg : Try doing 1/2 tablet twice a day, but if having problems with this, do 1 tablet at night. See if there is any improvement in leg symptoms 2. Continue to monitor for any seizures while on Tramadol 3. Follow-up in 4-5 months, call for any changes  Seizure Precautions: 1. If medication has been prescribed for you to prevent seizures, take it exactly as directed.  Do not stop taking the medicine without talking to your doctor first, even if you have not had a seizure in a long time.   2. Avoid activities in which a seizure would cause danger to yourself or to others.  Don't operate dangerous machinery, swim alone, or climb in high or dangerous places, such as on ladders, roofs, or girders.  Do not drive unless your doctor says you may.  3. If you have any warning that you may have a seizure, lay down in a safe place where you can't hurt yourself.    4.  No driving for 6 months from last seizure, as per [facility].   Please refer to the following link on the [place] website for more information: [email or web address]   5.  Maintain good sleep hygiene. Avoid alcohol.  6.  Contact your doctor if you have any problems that may be related to the medicine you are taking.  7.  Call 911 and bring the patient back to the ED if:        A.  The seizure lasts longer than 5 minutes.       B.  The patient doesn't awaken shortly after the seizure  C.  The patient has new problems such as difficulty seeing, speaking or moving  D.  The patient was injured during the seizure  E.  The patient has a temperature over 102 F (39C)  F.  The patient vomited and now is having trouble breathing

## 2017-06-11 ENCOUNTER — Encounter: Payer: Self-pay | Admitting: Neurology

## 2017-06-11 DIAGNOSIS — G44209 Tension-type headache, unspecified, not intractable: Secondary | ICD-10-CM | POA: Insufficient documentation

## 2017-06-11 DIAGNOSIS — R29898 Other symptoms and signs involving the musculoskeletal system: Secondary | ICD-10-CM | POA: Insufficient documentation

## 2017-06-24 ENCOUNTER — Encounter: Payer: Self-pay | Admitting: Internal Medicine

## 2017-06-24 ENCOUNTER — Ambulatory Visit (INDEPENDENT_AMBULATORY_CARE_PROVIDER_SITE_OTHER): Payer: Medicare Other | Admitting: Internal Medicine

## 2017-06-24 VITALS — BP 124/72 | HR 64 | Wt 100.0 lb

## 2017-06-24 DIAGNOSIS — M810 Age-related osteoporosis without current pathological fracture: Secondary | ICD-10-CM | POA: Diagnosis not present

## 2017-06-24 DIAGNOSIS — E871 Hypo-osmolality and hyponatremia: Secondary | ICD-10-CM

## 2017-06-24 LAB — SODIUM: Sodium: 135 mEq/L (ref 135–145)

## 2017-06-24 NOTE — Progress Notes (Signed)
Patient ID: Kaitlyn Flowers, female   DOB: [birth year], 76 y.o.   MRN: [id number]   HPI  Kaitlyn Flowers is a 76 y.o.-year-old female, returning for f/u for of hyponatremia and OP. Last visit 12/2016.  She is on Lamictal. No more seizures since last visit.  Since last visit, she had recurrent UTIs, almost back to back >> was cathing, taking preventative ABx. She stopped both of these >> since then (03/2017) no UTIs! She now sees infectious ds. (Dr. Baxter Flattery).  She also had ulcers in GI tract >> started mastic gum >> feels much better.  Hyponatremia:  Reviewed and addended hx: Pt had orthostatic syncope 03/03/2016 >> ED. She then returned to ED with diffic. Speaking, mild confusion and blurry vision on 03/25/2016 >> CT head neg. for stroke >> assumed to be a TIA or neuro changes from UTI. Na was 123 then.  She started to have UTIs beginning of 2017 (pseudomonas), had PICC line >> then oral ABx. She had several recurrences.  She had a urinary catheter - which she was using 3x a day as it was suspected that she could not empty the bladder well. The frequency was then reduced to every other day, but she developed a new UTI, and now she is again cathing every day.  She started to drink more water in last month as she developed dry mouth. She was also trying to increase water intake with her UTIs.   MRI/MRA on 06/08/2016 did not show a pituitary mass. It showed chronic microvascular ds.   End of 2017, she developed reading aphasia, confusion and thenverbal aphasia - lasted 30 min >> saw PCP (Na 132)  >> the episode repeated, also + HA >> went to the ED (Na 129). Sees Dr Delice Lesch (neuro).   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 She tried a spinal cord stimulator >> did not work at all.  I and Os - ~1.5-2L per day.  No HA, no increased thirst (only has this occas. dry mouth)  Reviewed Na levels - stable: Lab Results  Component Value Date   NA 133 (A) 02/19/2017   NA  133 (L) 12/23/2016   NA 129 (L) 08/09/2016   NA 132 (L) 07/22/2016   NA 129 (L) 07/10/2016   NA 132 (L) 07/09/2016   NA 130 (L) 06/21/2016   NA 132 (L) 04/02/2016   NA 126 (L) 03/27/2016   NA 124 (L) 03/25/2016   NA 123 (L) 03/25/2016   NA 135 02/13/2016   NA 135 11/02/2015   NA 135 07/11/2014   NA 132 (L) 02/10/2014   Other pertinent labs (04/02/2016): Osmolality     278 - 305 mOsm/kg 271 (L)  Cortisol, Plasma     ug/dL 10.2  C206 ACTH     6 - 50 pg/mL 16  Uric Acid, Serum     2.4 - 7.0 mg/dL 2.7  Osmolality, Urine     50 - 1,200 mOsm/kg 304  Potassium Urine     12 - 129 mmol/L 28  Sodium, Urine     28 - 272 mmol/L 77  Labs pointed towards improving SIADH/water O/L without appropriate renal water wasting. No adrenal insufficiency. No hypothyroidism. No HTG.  OP: Reviewed and addended hx: Pt was dx with OP in 2015.   She broke her R ankle roller skating. Now also sprained ankle.  No dizziness/vertigo/orthostasis.  I reviewed pt's DEXA scans:  Date L1-L4 T score FN T score  12/10/2013 -  1.7 RFN: -2.7 LFN: -2.5   L1-L4 decreased by 5.8% from 2012 Total mean hip BMD decreased 7.7%* from 2012   01/08/2016 Lumbar spine (L1-L4) Femoral neck (FN)  T-score - 1.9  RFN: - 2.5 LFN: - 2.7  Change in BMD from previous DXA test (%) - 1.9%  + 0.4%   (*) statistically significant  I suggested Fosamax 70 mg weekly,but she refused - in 03/2016   No h/o Hyper or hypocalcemia. No h/o kidney stones. Lab Results  Component Value Date   PTH 59 02/13/2016   PTH 66 (H) 07/11/2014   CALCIUM 9.3 08/09/2016   CALCIUM 9.8 07/09/2016   CALCIUM 10.1 06/21/2016   CALCIUM 9.9 04/02/2016   CALCIUM 9.6 03/27/2016   CALCIUM 9.6 03/25/2016   CALCIUM 9.8 02/13/2016   CALCIUM 9.3 02/13/2016   CALCIUM 10.1 11/02/2015   CALCIUM 10.0 07/11/2014   CALCIUM 9.8 07/11/2014   No h/o thyrotoxicosis. Reviewed TSH recent levels:  Lab Results  Component Value Date   TSH 1.28 02/13/2016   TSH  1.21 07/11/2014   TSH 1.11 11/11/2011   TSH 1.12 11/27/2010   + h/o vitamin D deficiency.  She was taking 5000 IU vit D daily >> stopped after 07/2016 check .  Last vitamin D - normal: Lab Results  Component Value Date   VD25OH 78.21 12/23/2016   VD25OH 64.30 07/22/2016   VD25OH 55.43 07/09/2016   VD25OH 94.31 04/02/2016   VD25OH 60.65 02/10/2014    She does take calcium supplements: calcium citrate.   No CKD. Last BUN/Cr: Lab Results  Component Value Date   BUN 16 02/19/2017   CREATININE 0.8 02/19/2017   + weight bearing exercises: walks, Pilates.  Pt does have a FH of osteoporosis in mother/sisters. + fractures: kyphosis in mother.   Menopause was at 26 - surgical TAH + BSO   ROS: Constitutional: + weight loss, no fatigue, no subjective hyperthermia, no subjective hypothermia Eyes: no blurry vision, no xerophthalmia ENT: no sore throat, no nodules palpated in throat, no dysphagia, no odynophagia, no hoarseness Cardiovascular: no CP/no SOB/no palpitations/+ swelling hands and feet - intermittent Respiratory: no cough/no SOB/no wheezing Gastrointestinal: no N/no V/no D/no C/no acid reflux Musculoskeletal: no muscle aches/no joint aches Skin: no rashes, no hair loss Neurological: no tremors/no numbness/no tingling/no dizziness, + nerve pain   I reviewed pt's medications, allergies, PMH, social hx, family hx, and changes were documented in the history of present illness. Otherwise, unchanged from my initial visit note.  Past Medical History:  Diagnosis Date  . Anemia   . Osteopenia   . PONV (postoperative nausea and vomiting)   . Seizures ([place])   . 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  . Wears hearing aid    Past Surgical History:  Procedure Laterality Date  . ABDOMINAL HYSTERECTOMY  1973   1/2 of ovary left cyst on ovary  . APPENDECTOMY     removed with hysterectomy  . LUMBAR WOUND DEBRIDEMENT  12/06/2011   Procedure: LUMBAR WOUND  DEBRIDEMENT;  Surgeon: Kristeen Miss, MD;  Location: [facility];  Service: Neurosurgery;  Laterality: N/A;  Repair of Pseudomeningocele Lumbar wound  . ROTATOR CUFF REPAIR     left   . shoulder rotator culff   2006  . spinal cord tumor  1987   2012 october    History   Social History  . Marital Status: married    Spouse Name: N/A    Number of  Children: 2   Occupational History  . consultant   Social History Main Topics  . Smoking status: Former Smoker -- 0.25 packs/day for 2 years    Types: Cigarettes    Quit date: 09/03/1967  . Smokeless tobacco: Not on file  . Alcohol Use: 2.4 oz/week    4-5 Glasses of wine per week  . 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 b complex vitamins capsule Take 1 capsule by mouth daily.    Marland Kitchen CALCIUM PO Take 1,000 Units by mouth daily.     Marland Kitchen CRANBERRY PO Take by mouth 3 (three) times daily.    . [employer] ASPIRIN EC 325 MG EC tablet TAKE 1 TABLET (325 MG TOTAL) BY MOUTH DAILY. 30 tablet 7  . Estradiol 10 MCG TABS vaginal tablet Place 1 tablet (10 mcg total) vaginally 2 (two) times a week. 8 tablet 11  . lamoTRIgine (LAMICTAL) 25 MG tablet Take 1 tablet (25 mg total) by mouth daily. 90 tablet 3  . MAGNESIUM PO Take 1,200 mg by mouth every evening.    . Meth-Hyo-M Bl-Na Phos-Ph Sal (URO-MP) 118 MG CAPS Take 118 mg by mouth daily as needed. UTI symptoms  3  . Omega-3 Fatty Acids (FISH OIL PO) Take by mouth 2 (two) times daily.    . Probiotic Product (PROBIOTIC PO) Take by mouth 2 (two) times daily.    Marland Kitchen RAPAFLO 8 MG CAPS capsule Take 8 mg by mouth every evening.    . traMADol (ULTRAM) 50 MG tablet     . butalbital-acetaminophen-caffeine (FIORICET, ESGIC) 50-325-40 MG tablet Take 1 tablet as needed twice a day for headache. Do not take more  than 2-3 times a week (Patient not taking: Reported on 06/06/2017) 10 tablet 4   No current facility-administered medications on file prior to visit.    Allergies  Allergen Reactions  . Ibuprofen-Famotidine Other (See Comments)    Elevated BP  . Penicillins Rash   Family History  Problem Relation Age of Onset  . Stroke Mother        2010  . Heart attack Father   . Anesthesia problems Neg Hx    PE: BP 124/72 (BP Location: Left Arm, Patient Position: Sitting)   Pulse 64   Wt 100 lb (45.4 kg)   LMP  (LMP Unknown)   SpO2 93%   BMI 18.59 kg/m  Wt Readings from Last 3 Encounters:  06/24/17 100 lb (45.4 kg)  06/06/17 102 lb (46.3 kg)  06/03/17 100 lb (45.4 kg)   Constitutional: normal weight, in NAD Eyes: PERRLA, EOMI, no exophthalmos ENT: moist mucous membranes, no thyromegaly, no cervical lymphadenopathy Cardiovascular: RRR, No MRG Respiratory: CTA B Gastrointestinal: abdomen soft, NT, ND, BS+ Musculoskeletal: no deformities, strength intact in all 4 Skin: moist, warm, no rashes Neurological: no tremor with outstretched hands, DTR normal in all 4  Assessment: 1. Hyponatremia  2. Osteoporosis  Plan: 1. Hyponatremia - Patient developed hyponatremia abruptly in 03/2016. She initially presented with orthostatic syncope and had difficulty speaking, mild confusion, blurry vision in the setting of a UTI. Her head CT and later an MRI was normal and her symptoms resolved. At that time, sodium was 123. Subsequent sodium levels have been improving, and are now stable,  slightly low, at 133. She was seen by neurology and was diagnosed with possible seizures - she is currently on Lamictal.  No episodes since last visit. - We continue with fluid restriction (intended approximately 1.5 L a day, but realistically up to almost 2 L a day), with stable sodium levels this year, latest being in 01/2017, at 133. At today's visit, we discussed that no interventions are needed for now, although,  she is aware that sodium tablets are another option, as discussed at last visit, although she has labile blood pressure and I would prefer not to use these. We will recheck a sodium level today. -  I again reviewed labs from last visits >> point towards either SIADH or increase water intake that overcomes renal mechanisms of urine dilution ( I suspect the latter).Treatment of choice for this condition is fluid restriction. - I will see her back in 7 mo  2. Osteoporosis - will check another DEXA in 2019 and plan to meet right after this t review it. - latest vit D was great at last visit >> will not repeat  Office Visit on 06/24/2017  Component Date Value Ref Range Status  . Sodium 06/24/2017 135  135 - 145 mEq/L Final   Na finally normal!  Philemon Kingdom, MD PhD [facility] Endocrinology

## 2017-06-24 NOTE — Patient Instructions (Signed)
Please call me in May of 2019 to order a new DEXA scan.  Please schedule another appt. At the end of May 2019.  Please stop at the lab.

## 2017-06-25 ENCOUNTER — Telehealth: Payer: Self-pay

## 2017-06-25 NOTE — Telephone Encounter (Signed)
Called patient and gave lab results. Patient had no questions or concerns.  

## 2017-06-25 NOTE — Telephone Encounter (Signed)
-----   Message from Philemon Kingdom, MD sent at 06/24/2017  5:05 PM EDT ----- Almyra Free, can you please call pt: Good news: sodium level is normal, 135!

## 2017-07-21 ENCOUNTER — Telehealth: Payer: Self-pay | Admitting: Neurology

## 2017-07-21 NOTE — Telephone Encounter (Signed)
Pt called and wants to know if she can switch her medications to gabapentin

## 2017-07-22 NOTE — Telephone Encounter (Signed)
She had previously reported that gabapentin had put her into a stupor. Does she recall the dose she was on when she had this side effect? We can switch to gabapentin, but would do low dose and slowly increase depending on how she is feeling. Thanks

## 2017-07-22 NOTE — Telephone Encounter (Signed)
LMOM asking pt to return call to the office for message below.

## 2017-07-28 ENCOUNTER — Other Ambulatory Visit: Payer: Medicare Other

## 2017-07-28 DIAGNOSIS — R300 Dysuria: Secondary | ICD-10-CM

## 2017-07-28 NOTE — Telephone Encounter (Signed)
Pt returned my call over the holiday weekend, leaving a message asking for return call.   Returned pt's call, no answer.  LMOM asking for return call to get answers for below.

## 2017-07-29 LAB — URINALYSIS, ROUTINE W REFLEX MICROSCOPIC
Bilirubin Urine: NEGATIVE
Glucose, UA: NEGATIVE
Hgb urine dipstick: NEGATIVE
Ketones, ur: NEGATIVE
Leukocytes, UA: NEGATIVE
Nitrite: NEGATIVE
Protein, ur: NEGATIVE
Specific Gravity, Urine: 1.011 (ref 1.001–1.03)
pH: 7.5 (ref 5.0–8.0)

## 2017-07-30 LAB — URINE CULTURE
MICRO NUMBER:: 81323878
SPECIMEN QUALITY:: ADEQUATE

## 2017-08-05 ENCOUNTER — Telehealth: Payer: Self-pay | Admitting: Neurology

## 2017-08-05 NOTE — Telephone Encounter (Signed)
Pt called and said she called before about medication and has not heard back yet about that, did not say what medication it was

## 2017-08-14 ENCOUNTER — Other Ambulatory Visit: Payer: Self-pay

## 2017-08-14 ENCOUNTER — Encounter: Payer: Self-pay | Admitting: Neurology

## 2017-08-14 MED ORDER — GABAPENTIN 100 MG PO CAPS: 100.0000 mg | ORAL_CAPSULE | Freq: Every day | ORAL | 11 refills | Status: DC

## 2017-08-18 ENCOUNTER — Telehealth: Payer: Self-pay

## 2017-08-18 NOTE — Telephone Encounter (Signed)
Spoke with pt last week about adding gabapentin and decreasing tramadol.  Pt is now on day 5 of gabapentin and states that it is helping and she is doing well on it.  Advised that she should be on both medications for a few days prior to tapering tramadol.  Pt agreed.  She will do 3 day titrate of gabapentin with 5 day decrease of tramadol.

## 2017-09-02 HISTORY — PX: SPINAL CORD STIMULATOR INSERTION: SHX5378

## 2017-09-18 ENCOUNTER — Encounter: Payer: Self-pay | Admitting: Internal Medicine

## 2017-09-25 ENCOUNTER — Ambulatory Visit: Payer: Self-pay

## 2017-09-25 NOTE — Telephone Encounter (Signed)
Patient called in with c/o "feeling like my heart is racing." She says "Sunday I got out the bed and felt like my heart was racing, I got a little nauseated, my vision got a little blurry and I had some mild woozy head. I sat down and rested and within an hour, everything subsided. This happened again 2 more days this week, but I was up in the kitchen making breakfast and I kept on doing what I was doing, I didn\'t feel the need to sit down, and the symptoms lasted about 30 minutes. Today, I felt it, so I called in. Right now as I\'m talking to you, it is all easing off, my vision is not blurred, my head still feels foggy but not lightheaded feeling, I still feel mild nausea but it is fading too." I instructed her how to check her apical pulse, she found it and counted x 30 seconds on my count, HR 98. I asked how did the pulse feel, she said "it felt steady, regular, no extra beats." I asked was it bounding, she said "yes, that\'s a good way to describe it, it was very noticeable." I asked at any time during those episodes did she have chest pain, SOB, vomit, feeling like she was going to pass out, she said "no, nothing other than what I already said and it was very mild."  Protocol indicates see PCP within 4 hours, but I edited to see within 24 hours since her symptoms subsided, an appointment made for tomorrow, 09/26/17 at 4pm with Dr. Burchette, care advice given, patient advised if the symptoms come back between now and her appointment, to call back or go to the ED for evaluation, she verbalized understanding.  Reason for Disposition . Age > 60 years (Exception: brief heart beat symptoms that went away and now feels well)  Answer Assessment - Initial Assessment Questions 1. DESCRIPTION: "Please describe your heart rate or heart beat that you are having" (e.g., fast/slow, regular/irregular, skipped or extra beats, "palpitations")     Steady, regular, hard 2. ONSET: "When did it start?" (Minutes, hours or  days)      Sunday was the first time, happened a couple more times 3. DURATION: "How long does it last" (e.g., seconds, minutes, hours)     About 1 hour the first time, then about 30 minutes each time 4. PATTERN "Does it come and go, or has it been constant since it started?"  "Does it get worse with exertion?"   "Are you feeling it now?"     Come and go, not any worse with exertion. Feeling a little nauseated 5. TAP: "Using your hand, can you tap out what you are feeling on a chair or table in front of you, so that I can hear?" (Note: not all patients can do this)       Unable 6. HEART RATE: "Can you tell me your heart rate?" "How many beats in 15 seconds?"  (Note: not all patients can do this)       98  beats (measured x 30 seconds by triager) 7. RECURRENT SYMPTOM: "Have you ever had this before?" If so, ask: "When was the last time?" and "What happened that time?"      No 8. CAUSE: "What do you think is causing the palpitations?"     Unsure 9. CARDIAC HISTORY: "Do you have any history of heart disease?" (e.g., heart attack, angina, bypass surgery, angioplasty, arrhythmia)      Denies 10. OTHER SYMPTOMS: "Do  you have any other symptoms?" (e.g., dizziness, chest pain, sweating, difficulty breathing)       Mild nausea, mild vision change but corrected now, mild lightheadedness but corrected now, head feels a little foggy but getting better. 11. PREGNANCY: "Is there any chance you are pregnant?" "When was your last menstrual period?"       No  Protocols used: HEART RATE AND HEARTBEAT QUESTIONS-A-AH

## 2017-09-26 ENCOUNTER — Other Ambulatory Visit: Payer: Self-pay

## 2017-09-26 ENCOUNTER — Ambulatory Visit: Payer: Medicare Other | Admitting: Family Medicine

## 2017-09-26 ENCOUNTER — Ambulatory Visit: Payer: Medicare Other | Admitting: Neurology

## 2017-09-26 ENCOUNTER — Encounter: Payer: Self-pay | Admitting: Family Medicine

## 2017-09-26 ENCOUNTER — Encounter: Payer: Self-pay | Admitting: Neurology

## 2017-09-26 VITALS — BP 138/70 | HR 78 | Resp 12 | Ht 61.0 in | Wt 103.0 lb

## 2017-09-26 VITALS — BP 120/68 | HR 85 | Temp 98.3°F | Wt 104.0 lb

## 2017-09-26 DIAGNOSIS — R5383 Other fatigue: Secondary | ICD-10-CM | POA: Diagnosis not present

## 2017-09-26 DIAGNOSIS — G40009 Localization-related (focal) (partial) idiopathic epilepsy and epileptic syndromes with seizures of localized onset, not intractable, without status epilepticus: Secondary | ICD-10-CM | POA: Diagnosis not present

## 2017-09-26 DIAGNOSIS — E871 Hypo-osmolality and hyponatremia: Secondary | ICD-10-CM | POA: Diagnosis not present

## 2017-09-26 DIAGNOSIS — R000 Tachycardia, unspecified: Secondary | ICD-10-CM | POA: Diagnosis not present

## 2017-09-26 DIAGNOSIS — R002 Palpitations: Secondary | ICD-10-CM

## 2017-09-26 DIAGNOSIS — R110 Nausea: Secondary | ICD-10-CM

## 2017-09-26 DIAGNOSIS — R42 Dizziness and giddiness: Secondary | ICD-10-CM

## 2017-09-26 NOTE — Progress Notes (Signed)
Subjective:     Patient ID: Kaitlyn Flowers, female   DOB: [birth year], 77 y.o.   MRN: [id number]  HPI   Patient seen with multiple nonspecific symptoms including one month history of fatigue and onset Sunday of intermittent sensation of her heart "racing "intermittently and some nonspecific dizziness. She does have seizure disorder and was seen by neurologist this morning. She denies any recent chest pain. No dyspnea. No clear aggravating or alleviating factors.  Patient does have prior history of hyponatremia. Minimal caffeine use. Walks for exercise and walking without any problems. On Lamictal per neurology. She's had some nausea but no vomiting. No abdominal pain. Denies recent headache.  History of syncopal episode 03/25/16. Her stroke evaluation was unremarkable. She had MRI the brain which showed no acute changes. She's had previous carotid Dopplers which were unremarkable. Echocardiogram normal. EEG showed abnormal bilateral temporal slowing and rare left frontotemporal epileptiform discharges.  She had intolerance with Topamax and was placed on low-dose Lamictal.  She is on very low-dose tramadol 50 mg one half tablet 3 times daily  Past Medical History:  Diagnosis Date  . Anemia   . Osteopenia   . PONV (postoperative nausea and vomiting)   . Seizures ([place])   . 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  . Wears hearing aid    Past Surgical History:  Procedure Laterality Date  . ABDOMINAL HYSTERECTOMY  1973   1/2 of ovary left cyst on ovary  . APPENDECTOMY     removed with hysterectomy  . LUMBAR WOUND DEBRIDEMENT  12/06/2011   Procedure: LUMBAR WOUND DEBRIDEMENT;  Surgeon: Kristeen Miss, MD;  Location: [facility];  Service: Neurosurgery;  Laterality: N/A;  Repair of Pseudomeningocele Lumbar wound  . ROTATOR CUFF REPAIR     left   . shoulder rotator culff   2006  . spinal cord tumor  1987   2012 october     reports that she quit smoking about 50 years  ago. Her smoking use included cigarettes. She has a 0.50 pack-year smoking history. she has never used smokeless tobacco. She reports that she drinks about 2.4 oz of alcohol per week. She reports that she does not use drugs. family history includes Heart attack in her father; Stroke in her mother. Allergies  Allergen Reactions  . Ibuprofen-Famotidine Other (See Comments)    Elevated BP  . Penicillins Rash     Review of Systems  Constitutional: Positive for fatigue. Negative for chills and fever.  Respiratory: Negative for shortness of breath and wheezing.   Cardiovascular: Negative for chest pain and leg swelling.  Gastrointestinal: Positive for nausea. Negative for abdominal pain, constipation, diarrhea and vomiting.  Genitourinary: Negative for dysuria.  Neurological: Positive for dizziness. Negative for weakness.  Hematological: Negative for adenopathy. Does not bruise/bleed easily.  Psychiatric/Behavioral: Negative for confusion.       Objective:   Physical Exam  Constitutional: She is oriented to person, place, and time. She appears well-developed and well-nourished.  HENT:  Right Ear: External ear normal.  Left Ear: External ear normal.  Mouth/Throat: Oropharynx is clear and moist.  Neck: Neck supple. No thyromegaly present.  Cardiovascular: Normal rate and regular rhythm.  Pulmonary/Chest: Effort normal and breath sounds normal. No respiratory distress. She has no wheezes. She has no rales.  Musculoskeletal: She exhibits no edema.  Neurological: She is alert and oriented to person, place, and time. No cranial nerve deficit.  Psychiatric: She has a normal mood and  affect. Her behavior is normal.       Assessment:     Patient has seizure disorder presents with one month history of nonspecific fatigue and onset about 5 days ago of some intermittent sensation of heart "racing" with nausea (without vomiting) and nonspecific dizziness.    Plan:     -Check further labs  with TSH, B12, comprehensive metabolic panel, CBC -Check EKG=NSR with no acute changes. -Continue to minimize caffeine intake -consider event monitor if subjective heart racing persists.  Eulas Post MD [facility]

## 2017-09-26 NOTE — Patient Instructions (Signed)
We will call you with labs done today Keep caffeine intake low. Follow up for any chest pain, syncope, or other new symptoms.

## 2017-09-26 NOTE — Patient Instructions (Signed)
1. Try taking the Lamotrigine 25mg  1/2 tablet twice a day 2. Proceed with PCP visit and bloodwork, and if unremarkable, we will plan for brain imaging 3. Try tapering off Tramadol as discussed 4. Follow-up in 4 months, call for any changes  Seizure Precautions: 1. If medication has been prescribed for you to prevent seizures, take it exactly as directed.  Do not stop taking the medicine without talking to your doctor first, even if you have not had a seizure in a long time.   2. Avoid activities in which a seizure would cause danger to yourself or to others.  Don't operate dangerous machinery, swim alone, or climb in high or dangerous places, such as on ladders, roofs, or girders.  Do not drive unless your doctor says you may.  3. If you have any warning that you may have a seizure, lay down in a safe place where you can't hurt yourself.    4.  No driving for 6 months from last seizure, as per [facility].   Please refer to the following link on the [facility] website for more information: http://www.epilepsyfoundation.org/answerplace/Social/driving/drivingu.cfm   5.  Maintain good sleep hygiene. Avoid alcohol.  6.  Contact your doctor if you have any problems that may be related to the medicine you are taking.  7.  Call 911 and bring the patient back to the ED if:        A.  The seizure lasts longer than 5 minutes.       B.  The patient doesn't awaken shortly after the seizure  C.  The patient has new problems such as difficulty seeing, speaking or moving  D.  The patient was injured during the seizure  E.  The patient has a temperature over 102 F (39C)  F.  The patient vomited and now is having trouble breathing

## 2017-09-26 NOTE — Progress Notes (Signed)
NEUROLOGY FOLLOW UP OFFICE NOTE  Kaitlyn Flowers [id number]  DOB: 10-25-1940  HISTORY OF PRESENT ILLNESS: I had the pleasure of seeing Kaitlyn Flowers in follow-up in the neurology clinic on 09/26/2017. The patient was last seen 3 months ago. She was being seen for seizures, she had a syncopal episode on 03/03/16, then a transient episode of aphasia on 7/24 and again last 07/21/16. Stroke workup in July was unremarkable. She started taking full dose aspirin. With recurrence of transient aphasia, this time with headache, considerations included seizure versus complicated migraine. She had an EEG done which showed bilateral temporal slowing, left greater than right, at times sharply contoured. There were a few sharp transients that appeared more epileptogenic over the left frontotemporal region. She had side effects on Topamax and was switched to Lamotrigine. On her last visit, she reported headaches suggestive of tension-type headaches. SHe was also reporting intermittent right leg weakness. She was advised to increase Lamotrigine, she was on a very low dose.  She presents today for an earlier visit due to new intermittent symptoms that started last Sunday. She woke up feeling off balance, sick to her stomach, and "racy" with too much energy like she had a whole bunch of caffeine. No palpitations. She lay back down and when she got up she felt better. She also had a funny frontal head pressure. She reports that the day prior, she started using CBD drops which seemed to help with her leg pain, but stopped it when she had these symptoms. A couple of days later, mid or late morning, she realized she again was feeling nauseated with her head feeling weird and "really off." Symptoms gradually improved in an hour. A couple of days later it happened again. She denies any further episodes of transient aphasia or syncope. She feels like her vision is off, "a little slow," but not so much right now. She has  always had some exhaustion, but more intensely feeling it this past week, like she is "just slogging through" the day. She gets good sleep, but wakes up and wants to go back to bed. She reports an incident recently when a friend dropped her off at home and she could not see the handle on her fence, felt slightly confused, then a split second later the handle was right there. She has tried the CBD again a few days ago and feels she still has the episodes even if she did not take it. She alternates the Lamictal to 1 tab qhs or 1/2 tab qhs, depending on what her next day would be like. Taking the whole pill makes her hard to wake up in the morning. She reports her right leg is fine with the exercises and chiropractor visits. She takes Tramadol 1/2 tab TID, but is not sure if this is helping, and is interested in tapering it off. She briefly did another trial of gabapentin but did not like it and stopped after a week.   HPI 05/29/2016: This is a very pleasant 77 yo RH woman who presented after a syncopal episode on 03/03/16 then a transient episode of aphasia on 03/25/16. She lives alone and was sitting outside last 03/03/16. She recalls turning around to get water, then blacked out. She woke up on the ground and found her glasses broken, she hit the right side of her face. No tongue bite/incontinence. She denied any prodromal symptoms, no dizziness or headaches. She had been drinking a lot of water due to recurrent UTIs since January 2017.  She went to the ER, it was felt fall was likely a result of orthostatic hypotension from sitting out in the heat. No bloodwork on record from that visit. Then on 03/25/16 she was reading something off her phone and could not make sense of it. She showed the phone to her friend who said it looked fine. Then she found she could not "get/grab my words." Her friend drove her to the ER, she felt her friend was driving the wrong way and she was thinking this, but could not say it. This lasted  20-30 minutes, no associated focal numbness/tingling/weakness, headaches, or dizziness. Her BP on arrival was 139/72. I personally reviewed head CT without contrast which did not show any acute changes. She was found to have a sodium level of 123. She was also diagnosed with another UTI with >100,000 aerococcus urinae. She denies any further recurrence of symptoms. She denies any olfactory/gustatory hallucinations, deja vu, rising epigastric sensation, focal numbness/tingling/weakness, myoclonic jerks. She has chronic back pain and self-catheterizes herself every night. She has chronic right leg numbness and weakness. For the past 1-1/2 years, she has had pain the right sacral area, worsened by sitting. She continues to work with PT.   She underwent a stroke workup, I personally reviewed images available. 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 MRA head did not show any intracranial stenosis. Carotid dopplers did not show any evidence of ICA stenosis bilaterally. Her echocardiogram showed normal EF 60-65%, normal RV size and systolic function, no significant valvular abnormalities, left atrium normal size. Lipid panel showed an LDL of 114, total cholesterol 241. She started to take a daily baby aspirin.  She saw her PCP on 11/6 reporting random headaches and wondered if related to her BP. She was scheduled for an earlier appointment today, then yesterday had another transient episode of aphasia. 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 When she tried to speak, her words were jumbled. She states the aphasia lasted 30-40 minutes. A friend took her home then to her PCP office at noon, where bloodwork was done. She went to the ER at 11pm, reporting severe headache that started an hour prior. She had an intense pressure and throbbing over the left temporal  region, 8/10, with nausea and vomiting. Headache lasted until this morning. No photo/phono/phonophobia. There was no aphasia noted in the ER, headache was treated with IV fluids and APAP. She denied any focal weakness or paresthesias. She denied having a headache with the previous episode of aphasia. She has no prior history of migraines. Over the past 2 weeks, she has been having random headaches localized on different parts of her head, occurring every 3-4 days, resolving after Tylenol intake.   Her mother had a stroke. Otherwise she had a normal birth and early development.  There is no history of febrile convulsions, CNS infections such as meningitis/encephalitis, significant traumatic brain injury, neurosurgical procedures, or family history of seizures.  PAST MEDICAL HISTORY: Past Medical History:  Diagnosis Date  . Anemia   . Osteopenia   . PONV (postoperative nausea and vomiting)   . Seizures ([place])   . 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  . Wears hearing aid     MEDICATIONS:  Outpatient Encounter Medications as of 09/26/2017  Medication Sig Note  . b complex  vitamins capsule Take 1 capsule by mouth daily.   Marland Kitchen CALCIUM PO Take 1,000 Units by mouth daily.    . [employer] ASPIRIN EC 325 MG EC tablet TAKE 1 TABLET (325 MG TOTAL) BY MOUTH DAILY.   Marland Kitchen Estradiol 10 MCG TABS vaginal tablet Place 1 tablet (10 mcg total) vaginally 2 (two) times a week.   . lamoTRIgine (LAMICTAL) 25 MG tablet Take 1 tablet (25 mg total) by mouth daily.   Marland Kitchen MAGNESIUM PO Take 1,200 mg by mouth every evening.   . Meth-Hyo-M Bl-Na Phos-Ph Sal (URO-MP) 118 MG CAPS Take 118 mg by mouth daily as needed. UTI symptoms   . Omega-3 Fatty Acids (FISH OIL PO) Take by mouth 2 (two) times daily.   . Probiotic Product (PROBIOTIC PO) Take by mouth 2 (two) times daily.   . traMADol (ULTRAM) 50 MG tablet    . [DISCONTINUED] butalbital-acetaminophen-caffeine (FIORICET, ESGIC) 50-325-40 MG tablet Take 1 tablet  as needed twice a day for headache. Do not take more than 2-3 times a week 04/24/2017: Never taken this   . [DISCONTINUED] CRANBERRY PO Take by mouth 3 (three) times daily.   . [DISCONTINUED] RAPAFLO 8 MG CAPS capsule Take 8 mg by mouth every evening.   . gabapentin (NEURONTIN) 100 MG capsule Take 1 capsule (100 mg total) by mouth at bedtime. (Patient not taking: Reported on 09/26/2017)    No facility-administered encounter medications on file as of 09/26/2017.      ALLERGIES: Allergies  Allergen Reactions  . Ibuprofen-Famotidine Other (See Comments)    Elevated BP  . Penicillins Rash    FAMILY HISTORY: Family History  Problem Relation Age of Onset  . Stroke Mother        2010  . Heart attack Father   . Anesthesia problems Neg Hx     SOCIAL HISTORY: Social History   Socioeconomic History  . Marital status: Legally Separated    Spouse name: Not on file  . Number of children: Not on file  . Years of education: Not on file  . Highest education level: Not on file  Social Needs  . Financial resource strain: Not on file  . Food insecurity - worry: Not on file  . Food insecurity - inability: Not on file  . Transportation needs - medical: Not on file  . Transportation needs - non-medical: Not on file  Occupational History  . Not on file  Tobacco Use  . Smoking status: Former Smoker    Packs/day: 0.25    Years: 2.00    Pack years: 0.50    Types: Cigarettes    Last attempt to quit: 09/03/1967    Years since quitting: 50.0  . Smokeless tobacco: Never Used  . Tobacco comment: quit 48 years   Substance and Sexual Activity  . Alcohol use: Yes    Alcohol/week: 2.4 oz    Types: 4 Glasses of wine per week    Comment: have a glass of wine with dinner   . Drug use: No  . Sexual activity: Not on file  Other Topics Concern  . 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   Born in [place] calls [place] home in [place]: Constitutional: No fevers, chills, or sweats, no generalized fatigue, change in appetite Eyes: No visual changes, double vision, eye pain Ear, nose and throat: No hearing loss, ear pain, nasal congestion, sore throat  Cardiovascular: No chest pain, palpitations Respiratory:  No shortness of breath at rest or with exertion, wheezes GastrointestinaI: No nausea, vomiting, diarrhea, abdominal pain, fecal incontinence Genitourinary:  No dysuria, urinary retention or frequency Musculoskeletal:  + neck pain, back pain Integumentary: No rash, pruritus, skin lesions Neurological: as above Psychiatric: No depression, insomnia, anxiety Endocrine: No palpitations, fatigue, diaphoresis, mood swings, change in appetite, change in weight, increased thirst Hematologic/Lymphatic:  No anemia, purpura, petechiae. Allergic/Immunologic: no itchy/runny eyes, nasal congestion, recent allergic reactions, rashes  PHYSICAL EXAM: Vitals:   09/26/17 1311  BP: 138/70  Pulse: 78  Resp: 12   General: No acute distress Head:  Normocephalic/atraumatic Neck: supple, no paraspinal tenderness, full range of motion Heart:  Regular rate and rhythm Lungs:  Clear to auscultation bilaterally Back: No paraspinal tenderness Skin/Extremities: No rash, no edema.  Neurological Exam: alert and oriented to person, place, and time. No aphasia or dysarthria. Fund of knowledge is appropriate.  Recent and remote memory are intact. 3/3 delayed recall. Attention and concentration are normal.    Able to name objects and repeat phrases, read and write. Cranial nerves: Pupils equal, round, reactive to light.  Fundoscopic exam unremarkable, no papilledema. Extraocular movements intact with no nystagmus. Visual fields full. Facial sensation intact. No facial asymmetry. Tongue, uvula, palate midline.  Motor: Bulk and tone normal, muscle strength 5/5 throughout with no pronator drift.  Sensation to light touch, temperature  and vibration intact.  No extinction to double simultaneous stimulation.  Deep tendon reflexes +1 on both UE and left patella, unable to elicit on right patella. Toes downgoing.  Finger to nose testing intact.  Gait narrow-based and steady, mild difficulty with tandem walk but able, no ataxia. Romberg negative.  IMPRESSION: This is a pleasant 77 yo RH woman who had a syncopal episode on 03/03/16, then a transient episode of expressive aphasia last 03/25/16. Stroke workup was unremarkable, MRI brain did not show any acute changes, no significant stenosis seen intracranially or with carotid dopplers. Echo normal. She had another transient episode of aphasia on 07/21/16, this time followed by intense headache. No headache with previous event. Her EEG was abnormal with bilateral temporal slowing and rare left frontotemporal epileptiform discharges. 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 She had side effects on Topamax, currently on very low dose Lamotrigine 25mg  1/2 tab to 1 tab qhs wit no aphasic episodes since 07/2016. She presents today for new symptoms where she intermittently feels off balanced, with a weird sensation in her head, as well as exhaustion. Her neurological exam is normal today. Etiology of symptoms is unclear, she will be seeing her PCP, recommend adding on TSH, and B12 if bloodwork will be done. We discussed that if bloodwork is normal, we will do brain imaging. She will try taking Lamotrigine 25mg  1/2 tab BID (this is still a low dose). She is interested in tapering Tramadol, we discussed slowly weaning off medication. She is aware of [place] driving laws to stop driving after a seizure until 6 months seizure-free. She will follow-up in 4-5 months.   Thank you for allowing me to participate in her care.  Please do not hesitate to call for any questions or concerns.  The duration of this appointment visit was 25 minutes of face-to-face time  with the patient.  Greater than 50% of this time was spent in counseling, explanation of diagnosis, planning of further management, and coordination of care.   Ellouise Newer, M.D.  CC: Dr. Regis Bill

## 2017-09-29 ENCOUNTER — Telehealth: Payer: Self-pay

## 2017-09-29 NOTE — Telephone Encounter (Signed)
LMOM relaying message below.  

## 2017-09-29 NOTE — Telephone Encounter (Signed)
-----   Message from Cameron Sprang, MD sent at 09/29/2017  9:51 AM EST ----- Pls let patient know the bloodwork is normal. We can proceed with MRI brain without contrast, if she is still feeling off. Thanks

## 2017-09-30 LAB — CBC WITH DIFFERENTIAL/PLATELET
Basophils Absolute: 43 cells/uL (ref 0–200)
Basophils Relative: 0.6 %
Eosinophils Absolute: 78 cells/uL (ref 15–500)
Eosinophils Relative: 1.1 %
HCT: 31.6 % — ABNORMAL LOW (ref 35.0–45.0)
Hemoglobin: 10.7 g/dL — ABNORMAL LOW (ref 11.7–15.5)
Lymphs Abs: 2158 cells/uL (ref 850–3900)
MCH: 32.7 pg (ref 27.0–33.0)
MCHC: 33.9 g/dL (ref 32.0–36.0)
MCV: 96.6 fL (ref 80.0–100.0)
MPV: 9.7 fL (ref 7.5–12.5)
Monocytes Relative: 7.5 %
Neutro Abs: 4288 cells/uL (ref 1500–7800)
Neutrophils Relative %: 60.4 %
Platelets: 240 10*3/uL (ref 140–400)
RBC: 3.27 10*6/uL — ABNORMAL LOW (ref 3.80–5.10)
RDW: 12.4 % (ref 11.0–15.0)
Total Lymphocyte: 30.4 %
WBC mixed population: 533 cells/uL (ref 200–950)
WBC: 7.1 10*3/uL (ref 3.8–10.8)

## 2017-09-30 LAB — COMPREHENSIVE METABOLIC PANEL
Chloride: 97 mmol/L — ABNORMAL LOW (ref 98–110)
Potassium: 5.1 mmol/L (ref 3.5–5.3)
Sodium: 132 mmol/L — ABNORMAL LOW (ref 135–146)

## 2017-09-30 LAB — VITAMIN B12: Vitamin B-12: 1231 pg/mL — ABNORMAL HIGH (ref 200–1100)

## 2017-09-30 LAB — TSH: TSH: 0.83 mIU/L (ref 0.40–4.50)

## 2017-10-10 ENCOUNTER — Ambulatory Visit: Payer: Medicare Other | Admitting: Neurology

## 2017-11-12 ENCOUNTER — Telehealth: Payer: Self-pay | Admitting: Neurology

## 2017-11-12 NOTE — Telephone Encounter (Signed)
She has seen a Neurologist with the Pain clinic at [facility]. He is wanting her to follow Up due to her falling. Please Call. Thanks

## 2018-01-05 ENCOUNTER — Telehealth: Payer: Self-pay | Admitting: Internal Medicine

## 2018-01-05 NOTE — Telephone Encounter (Signed)
Pt called about needing a order to get a bone density. Please advise and Thank you!  Call pt @ [phone].

## 2018-01-06 NOTE — Telephone Encounter (Signed)
Spoke to patient. Confirmed received message. Per last OV pt to call and request scan. Will fwd to Dr. Cruzita Lederer.  Pt verbalized understanding.

## 2018-01-07 ENCOUNTER — Telehealth: Payer: Self-pay | Admitting: Internal Medicine

## 2018-01-07 ENCOUNTER — Other Ambulatory Visit: Payer: Self-pay | Admitting: Internal Medicine

## 2018-01-07 DIAGNOSIS — M810 Age-related osteoporosis without current pathological fracture: Secondary | ICD-10-CM

## 2018-01-07 NOTE — Telephone Encounter (Addendum)
Scheduled appt for 05/09 @ 10:30a.m. Provided pt with appt info.  Left detailed message on vmail w/ scheduling phone number if pt unable to make appt scheduled.

## 2018-01-07 NOTE — Telephone Encounter (Signed)
Pt called I relayed the information about the dexa scan to her for tomorrow

## 2018-01-07 NOTE — Telephone Encounter (Signed)
C, I ordered this for her to be done at [facility].  Can you please schedule this?

## 2018-01-08 ENCOUNTER — Ambulatory Visit (INDEPENDENT_AMBULATORY_CARE_PROVIDER_SITE_OTHER)
Admission: RE | Admit: 2018-01-08 | Discharge: 2018-01-08 | Disposition: A | Discharge status: Home / Self Care | Payer: Medicare Other | Source: Ambulatory Visit | Attending: Internal Medicine | Admitting: Internal Medicine

## 2018-01-08 DIAGNOSIS — M810 Age-related osteoporosis without current pathological fracture: Secondary | ICD-10-CM

## 2018-01-28 ENCOUNTER — Ambulatory Visit: Payer: Medicare Other | Admitting: Neurology

## 2018-01-28 ENCOUNTER — Encounter

## 2018-02-09 ENCOUNTER — Other Ambulatory Visit: Payer: Medicare Other

## 2018-02-09 ENCOUNTER — Other Ambulatory Visit: Payer: Self-pay | Admitting: *Deleted

## 2018-02-09 DIAGNOSIS — N3000 Acute cystitis without hematuria: Secondary | ICD-10-CM

## 2018-02-10 LAB — URINALYSIS, ROUTINE W REFLEX MICROSCOPIC
Bilirubin Urine: NEGATIVE
Glucose, UA: NEGATIVE
Hyaline Cast: NONE SEEN /LPF
Ketones, ur: NEGATIVE
Nitrite: NEGATIVE
RBC / HPF: NONE SEEN /HPF (ref 0–2)
Specific Gravity, Urine: 1.011 (ref 1.001–1.03)
Squamous Epithelial / LPF: NONE SEEN /HPF (ref <=5)
WBC, UA: >=60 /HPF — AB (ref 0–5)
pH: 7.5 (ref 5.0–8.0)

## 2018-02-10 LAB — URINE CULTURE
MICRO NUMBER:: 90693616
SPECIMEN QUALITY:: ADEQUATE

## 2018-02-11 ENCOUNTER — Telehealth: Payer: Self-pay

## 2018-02-11 NOTE — Telephone Encounter (Signed)
Pt called today with questions on labs she had done a couple of days ago. Pt stated that on mychart she sees the urine culture, but does not see results. Pt would like for Dr. Baxter Flattery to go over labs with her. [place]

## 2018-02-13 NOTE — Telephone Encounter (Signed)
Her urine culture did not identify any bacteria. So no abtx is needed

## 2018-02-13 NOTE — Telephone Encounter (Signed)
Patient called stating she gave a urine sample on Monday 02/09/2018 she states results are not in My chart yet.  Informed her Dr. Baxter Flattery would have to review results and then release then to her.  Patient verbalized understanding. Pricilla Riffle RN

## 2018-02-16 NOTE — Telephone Encounter (Signed)
Called patient and informed her per Dr. Baxter Flattery that her urine culture did not identify any bacteria and antibiotics is not needed at this time.  Patient verbalized understanding. Pricilla Riffle RN

## 2018-04-07 ENCOUNTER — Other Ambulatory Visit: Payer: Self-pay | Admitting: Physician Assistant

## 2018-04-07 DIAGNOSIS — M545 Low back pain: Principal | ICD-10-CM

## 2018-04-07 DIAGNOSIS — G8929 Other chronic pain: Secondary | ICD-10-CM

## 2018-04-13 ENCOUNTER — Ambulatory Visit
Admission: RE | Admit: 2018-04-13 | Discharge: 2018-04-13 | Disposition: A | Discharge status: Home / Self Care | Payer: Medicare Other | Source: Ambulatory Visit | Attending: Physician Assistant | Admitting: Physician Assistant

## 2018-04-13 DIAGNOSIS — G8929 Other chronic pain: Secondary | ICD-10-CM

## 2018-04-13 DIAGNOSIS — M545 Low back pain: Principal | ICD-10-CM

## 2018-04-29 ENCOUNTER — Encounter: Payer: Self-pay | Admitting: Internal Medicine

## 2018-04-29 ENCOUNTER — Ambulatory Visit: Payer: Medicare Other | Admitting: Internal Medicine

## 2018-04-29 VITALS — BP 90/58 | HR 60 | Ht 61.0 in | Wt 101.2 lb

## 2018-04-29 DIAGNOSIS — M810 Age-related osteoporosis without current pathological fracture: Secondary | ICD-10-CM

## 2018-04-29 LAB — BASIC METABOLIC PANEL WITH GFR
BUN/Creatinine Ratio: 33 (calc) — ABNORMAL HIGH (ref 6–22)
BUN: 27 mg/dL — ABNORMAL HIGH (ref 7–25)
CO2: 29 mmol/L (ref 20–32)
Calcium: 10.2 mg/dL (ref 8.6–10.4)
Chloride: 98 mmol/L (ref 98–110)
Creat: 0.82 mg/dL (ref 0.60–0.93)
GFR, Est African American: 81 mL/min/{1.73_m2} (ref >=60)
GFR, Est Non African American: 70 mL/min/{1.73_m2} (ref >=60)
Glucose, Bld: 75 mg/dL (ref 65–99)
Potassium: 4.8 mmol/L (ref 3.5–5.3)
Sodium: 137 mmol/L (ref 135–146)

## 2018-04-29 LAB — VITAMIN D 25 HYDROXY (VIT D DEFICIENCY, FRACTURES): VITD: 46.63 ng/mL (ref 30.00–100.00)

## 2018-04-29 NOTE — Progress Notes (Signed)
Patient ID: Kaitlyn Flowers, female   DOB: [birth year], 77 y.o.   MRN: [id number]   HPI  Kaitlyn Flowers is a 77 y.o.-year-old female, returning for f/u for of hyponatremia and OP. Last visit 06/2017.  She has a history of seizures and is on Lamictal.  No seizures since last visit.  She continues to see infectious disease (Dr. Graylon Good) for her chronic UTIs.  Her scoliosis is getting worse >> increased pain >> will get a spinal cord stimulator.  She may need to have surgery to correct her progressing scoliosis.  She is seen in the scoliosis clinic at [facility].  She was advised that she needs to strengthen her bones before the surgery ideally with teriparatide or abaloparatide.  Hyponatremia:  Reviewed history: Pt had orthostatic syncope 03/03/2016 >> ED. She then returned to ED with diffic. Speaking, mild confusion and blurry vision on 03/25/2016 >> CT head neg. for stroke >> assumed to be a TIA or neuro changes from UTI. Na was 123 then.  She started to have UTIs beginning of 2017 (pseudomonas), had PICC line >> then oral ABx. She had several recurrences.  She had a urinary catheter - which she was using 3x a day as it was suspected that she could not empty the bladder well. The frequency was then reduced to every other day, but she developed a new UTI, and now she is again cathing every day.  She started to drink more water in last month as she developed dry mouth. She was also trying to increase water intake with her UTIs.   MRI/MRA on 06/08/2016 did not show a pituitary mass. It showed chronic microvascular ds.   End of 2017, she developed reading aphasia, confusion and thenverbal aphasia - lasted 30 min >> saw PCP (Na 132)  >> the episode repeated, also + HA >> went to the ED (Na 129). Sees Dr Delice Lesch (neuro).   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 She tried a spinal cord stimulator >> did not work at all.  I&O's, approximately 1.5 to 2 L/day.  She denies  headaches or increased thirst -but she does have occasional dry mouth.  Reviewed her sodium levels -slightly low: Lab Results  Component Value Date   NA 132 (L) 09/26/2017   NA 135 06/24/2017   NA 133 (A) 02/19/2017   NA 133 (L) 12/23/2016   NA 129 (L) 08/09/2016   NA 132 (L) 07/22/2016   NA 129 (L) 07/10/2016   NA 132 (L) 07/09/2016   NA 130 (L) 06/21/2016   NA 132 (L) 04/02/2016   NA 126 (L) 03/27/2016   NA 124 (L) 03/25/2016   NA 123 (L) 03/25/2016   NA 135 02/13/2016   NA 135 11/02/2015   Reviewed other pertinent labs from 04/02/2016: Osmolality     278 - 305 mOsm/kg 271 (L)  Cortisol, Plasma     ug/dL 10.2  C206 ACTH     6 - 50 pg/mL 16  Uric Acid, Serum     2.4 - 7.0 mg/dL 2.7  Osmolality, Urine     50 - 1,200 mOsm/kg 304  Potassium Urine     12 - 129 mmol/L 28  Sodium, Urine     28 - 272 mmol/L 77  Labs pointed towards improving SIADH/water overload without appropriate renal water wasting.    She did not have adrenal insufficiency, hypothyroidism, no hypertriglyceridemia.  OP: Reviewed and addended history: Pt was dx with OP in 2015.  She broke her R ankle roller skating. Now also sprained ankle.  No dizziness/vertigo/orthostasis.  Reviewed her previous DXA scan's:  01/08/2018 Lumbar spine L1-L4 (L2) Femoral neck (FN)  T-score -0.1 RFN: -2.7 LFN: -2.5  Change in BMD from previous DXA test (%) +11.1%* -1.0%  (*) statistically significant   Date L1-L4 T score FN T score  12/10/2013 -1.7 RFN: -2.7 LFN: -2.5   L1-L4 decreased by 5.8% from 2012 Total mean hip BMD decreased 7.7%* from 2012   01/08/2016 Lumbar spine (L1-L4) Femoral neck (FN)  T-score - 1.9  RFN: - 2.5 LFN: - 2.7  Change in BMD from previous DXA test (%) - 1.9%  + 0.4%   (*) statistically significant  I suggested Fosamax weekly, but she refused in 03/2016.  No history of hypo-or hypercalcemia, no history of kidney stones. Lab Results  Component Value Date   PTH 59 02/13/2016    PTH 66 (H) 07/11/2014   CALCIUM 9.3 08/09/2016   CALCIUM 9.8 07/09/2016   CALCIUM 10.1 06/21/2016   CALCIUM 9.9 04/02/2016   CALCIUM 9.6 03/27/2016   CALCIUM 9.6 03/25/2016   CALCIUM 9.8 02/13/2016   CALCIUM 9.3 02/13/2016   CALCIUM 10.1 11/02/2015   CALCIUM 10.0 07/11/2014   CALCIUM 9.8 07/11/2014   No history of thyrotoxicosis.  Latest TFTs: Lab Results  Component Value Date   TSH 0.83 09/26/2017   TSH 1.28 02/13/2016   TSH 1.21 07/11/2014   TSH 1.11 11/11/2011   She has a history of vitamin D deficiency.  She was taking 5000 IU vit D daily >> then off >> 1600 IU x 2-3 a day.  Last vitamin D -excellent: Lab Results  Component Value Date   VD25OH 78.21 12/23/2016   VD25OH 64.30 07/22/2016   VD25OH 55.43 07/09/2016   VD25OH 94.31 04/02/2016   VD25OH 60.65 02/10/2014    She does take calcium citrate.  No CKD. Last BUN/Cr: Lab Results  Component Value Date   BUN 16 02/19/2017   CREATININE 0.8 02/19/2017   For exercise, she walks and she does Pilates.  Pt does have a FH of osteoporosis in mother/sisters.  + Kyphosis in mother.   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    She is seen at [facility] for degenerative scoliosis.  ROS: Constitutional: no weight gain/no weight loss, no fatigue, no subjective hyperthermia, no subjective hypothermia Eyes: no blurry vision, no xerophthalmia ENT: no sore throat, no nodules palpated in throat, no dysphagia, no odynophagia, no hoarseness Cardiovascular: no CP/no SOB/no palpitations/no leg swelling Respiratory: no cough/no SOB/no wheezing Gastrointestinal: no N/no V/no D/no C/no acid reflux Musculoskeletal: no muscle aches/no joint aches Skin: no rashes, no hair loss Neurological: no tremors/no numbness/no tingling/no dizziness  I reviewed pt's medications, allergies, PMH, social hx, family hx,  and changes were documented in the history of present illness. Otherwise, unchanged from my initial visit note.  Past Medical History:  Diagnosis Date  . Anemia   . Osteopenia   . PONV (postoperative nausea and vomiting)   . Seizures ([place])   . 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  . Wears hearing aid    Past Surgical History:  Procedure Laterality Date  . ABDOMINAL HYSTERECTOMY  1973   1/2 of ovary left cyst on ovary  . APPENDECTOMY     removed with hysterectomy  .  LUMBAR WOUND DEBRIDEMENT  12/06/2011   Procedure: LUMBAR WOUND DEBRIDEMENT;  Surgeon: Kristeen Miss, MD;  Location: [facility];  Service: Neurosurgery;  Laterality: N/A;  Repair of Pseudomeningocele Lumbar wound  . ROTATOR CUFF REPAIR     left   . shoulder rotator culff   2006  . spinal cord tumor  1987   2012 october    History   Social History  . Marital Status: married    Spouse Name: N/A    Number of Children: 2   Occupational History  . consultant   Social History Main Topics  . Smoking status: Former Smoker -- 0.25 packs/day for 2 years    Types: Cigarettes    Quit date: 09/03/1967  . Smokeless tobacco: Not on file  . Alcohol Use: 2.4 oz/week    4-5 Glasses of wine per week  . 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 b complex vitamins capsule Take 1 capsule by mouth daily.    Marland Kitchen CALCIUM PO Take 1,000 Units by mouth daily.     . [employer] ASPIRIN EC 325 MG EC tablet TAKE 1 TABLET (325 MG TOTAL) BY MOUTH DAILY. 30 tablet 7  . Estradiol 10 MCG TABS vaginal tablet Place 1 tablet (10 mcg total) vaginally 2 (two) times a week. 8 tablet 11  . lamoTRIgine (LAMICTAL) 25 MG tablet Take 1 tablet (25 mg total) by mouth daily. 90 tablet 3  . MAGNESIUM PO Take 1,200 mg by mouth every  evening.    . Meth-Hyo-M Bl-Na Phos-Ph Sal (URO-MP) 118 MG CAPS Take 118 mg by mouth daily as needed. UTI symptoms  3  . Omega-3 Fatty Acids (FISH OIL PO) Take by mouth 2 (two) times daily.    . Probiotic Product (PROBIOTIC PO) Take by mouth 2 (two) times daily.    . traMADol (ULTRAM) 50 MG tablet      No current facility-administered medications on file prior to visit.    Allergies  Allergen Reactions  . Ibuprofen-Famotidine Other (See Comments)    Elevated BP  . Penicillins Rash   Family History  Problem Relation Age of Onset  . Stroke Mother        2010  . Heart attack Father   . Anesthesia problems Neg Hx    PE: BP (!) 90/58   Pulse 60   Ht 5\' 1"  (1.549 m)   Wt 101 lb 3.2 oz (45.9 kg)   LMP  (LMP Unknown)   BMI 19.12 kg/m  Wt Readings from Last 3 Encounters:  04/29/18 101 lb 3.2 oz (45.9 kg)  09/26/17 104 lb (47.2 kg)  09/26/17 103 lb (46.7 kg)   Constitutional: Normal weight, in NAD Eyes: PERRLA, EOMI, no exophthalmos ENT: moist mucous membranes, no thyromegaly, no cervical lymphadenopathy Cardiovascular: RRR, No MRG Respiratory: CTA B Gastrointestinal: abdomen soft, NT, ND, BS+ Musculoskeletal: + Significant scoliosis (wears a brace), strength intact in all 4 Skin: moist, warm, no rashes Neurological: no tremor with outstretched hands, DTR normal in all 4  Assessment: 1. Hyponatremia  2. Osteoporosis  Plan: 1. Hyponatremia -Patient developed hyponatremia abruptly 03/2016.  She initially presented with orthostatic syncope and difficulty speaking, mild confusion, blurry vision in the setting of a UTI.  Her head CT  and later and MRI were normal and her symptoms resolved.  At that time, sodium was 123.  Subsequent sodium levels have been improving and they are now slightly low, with the last level being 132.  Of note, she has a history of possible seizures and is currently on Lamictal. -We continue to manage her with fluid restriction (intended approximately 1.5  L a day, but realistically she is up to almost 2 L a day).  We discussed in the past the sodium tablets are another option although she has labile blood pressure and I would prefer not to use these. -I again reviewed labs from last visits, which point towards either SIADH or increased water intake that overcomes renal mechanisms of urine dilution (I suspect the latter).  Treatment of choice for this condition is fluid restriction. -We will check her sodium again today  2. Osteoporosis -We reviewed together the results and images of her most recent DEXA scan (01/08/2018).  Her femoral neck scores are still osteoporotic, fairly stable from last check, while the spine score increased.  However, L2 vertebra was excluded from analysis due to degenerative changes as opposed to the DEXA scan from 2017, in which all vertebrae were analyzed -She is here today to discuss about options for treatment.  Reviewing her chart, she had a meningioma resected from her thoracic spine, followed by skeletal radiotherapy.  This will contraindicate the use of teriparatide (Forteo) or abaloparatyde (Tymlos).  I explained that the areas of the bone exposed to radiation therapy are at risk for osteosarcoma if stimulated by the above medications.  In this case, we discussed about either using Prolia or Reclast.  I explained the mechanism of action, benefits, and possible side effects of the above medications.  No dental work in progress.  She agrees with Prolia.  She was given more information about it. -We will recheck a vitamin D today along with a BMP.  If these are normal, we will go ahead with a Prolia preauthorization.  - time spent with the patient: 30 minutes, of which >50% was spent in obtaining information about her symptoms, reviewing her previous labs, evaluations, and discussing options for treatment for her osteoporosis (please see the discussed topics above); she had a number of questions which I addressed.  Office  Visit on 04/29/2018  Component Date Value Ref Range Status  . VITD 04/29/2018 46.63  30.00 - 100.00 ng/mL Final  . Glucose, Bld 04/29/2018 75  65 - 99 mg/dL Final   Comment: .            Fasting reference interval .   . BUN 04/29/2018 27* 7 - 25 mg/dL Final  . Creat 04/29/2018 0.82  0.60 - 0.93 mg/dL Final   Comment: For patients >19 years of age, the reference limit for Creatinine is approximately 13% higher for people identified as African-American. .   . GFR, Est Non African American 04/29/2018 70  > OR = 60 mL/min/1.18m2 Final  . GFR, Est African American 04/29/2018 81  > OR = 60 mL/min/1.44m2 Final  . BUN/Creatinine Ratio 04/29/2018 33* 6 - 22 (calc) Final  . Sodium 04/29/2018 137  135 - 146 mmol/L Final  . Potassium 04/29/2018 4.8  3.5 - 5.3 mmol/L Final  . Chloride 04/29/2018 98  98 - 110 mmol/L Final  . CO2 04/29/2018 29  20 - 32 mmol/L Final  . Calcium 04/29/2018 10.2  8.6 - 10.4 mg/dL Final   Msg sent: Dear Ms Mariel Craft, Labs are good, including  sodium level! Will start the process to get Prolia approved. We will let you know when to come for the injection.  Sincerely, Philemon Kingdom MD  Philemon Kingdom, MD PhD [facility]

## 2018-04-29 NOTE — Patient Instructions (Addendum)
Please stop at the lab.  Please come back for a follow-up appointment in 1 year.  Denosumab: Patient drug information (Up-to-date) Copyright [phone] [employer] rights reserved.  Brand Names: U.S.  ProliaDelton See What is this drug used for?  It is used to treat soft, brittle bones (osteoporosis).  It is used for bone growth.  It is used when treating some cancers.  It may be given to you for other reasons. Talk with the doctor. What do I need to tell my doctor BEFORE I take this drug?  All products:  If you have an allergy to denosumab or any other part of this drug.  If you are allergic to any drugs like this one, any other drugs, foods, or other substances. Tell your doctor about the allergy and what signs you had, like rash; hives; itching; shortness of breath; wheezing; cough; swelling of face, lips, tongue, or throat; or any other signs.  If you have low calcium levels.  Prolia:  If you are pregnant or may be pregnant. Do not take this drug if you are pregnant.  This is not a list of all drugs or health problems that interact with this drug.  Tell your doctor and pharmacist about all of your drugs (prescription or OTC, natural products, vitamins) and health problems. You must check to make sure that it is safe for you to take this drug with all of your drugs and health problems. Do not start, stop, or change the dose of any drug without checking with your doctor. What are some things I need to know or do while I take this drug?  All products:  Tell dentists, surgeons, and other doctors that you use this drug.  This drug may raise the chance of a broken leg. Talk with your doctor.  Have your blood work checked. Talk with your doctor.  Have a bone density test. Talk with your doctor.  Take calcium and vitamin D as you were told by your doctor.  Have a dental exam before starting this drug.  Take good care of your teeth. See a dentist often.  If you smoke,  talk with your doctor.  Do not give to a child. Talk with your doctor.  Tell your doctor if you are breast-feeding. You will need to talk about any risks to your baby.  Delton See:  This drug may cause harm to the unborn baby if you take it while you are pregnant. If you get pregnant while taking this drug, call your doctor right away.  Prolia:  Very bad infections have been reported with use of this drug. If you have any infection, are taking antibiotics now or in the recent past, or have many infections, talk with your doctor.  You may have more chance of getting an infection. Wash hands often. Stay away from people with infections, colds, or flu.  Use birth control that you can trust to prevent pregnancy while taking this drug.  If you are a man and your sex partner is pregnant or gets pregnant at any time while you are being treated, talk with your doctor. What are some side effects that I need to call my doctor about right away?  WARNING/CAUTION: Even though it may be rare, some people may have very bad and sometimes deadly side effects when taking a drug. Tell your doctor or get medical help right away if you have any of the following signs or symptoms that may be related to a very  bad side effect:  All products:  Signs of an allergic reaction, like rash; hives; itching; red, swollen, blistered, or peeling skin with or without fever; wheezing; tightness in the chest or throat; trouble breathing or talking; unusual hoarseness; or swelling of the mouth, face, lips, tongue, or throat.  Signs of low calcium levels like muscle cramps or spasms, numbness and tingling, or seizures.  Mouth sores.  Any new or strange groin, hip, or thigh pain.  This drug may cause jawbone problems. The chance may be higher the longer you take this drug. The chance may be higher if you have cancer, dental problems, dentures that do not fit well, anemia, blood clotting problems, or an infection. The chance may  also be higher if you are having dental work or if you are getting chemo, some steroid drugs, or radiation. Call your doctor right away if you have jaw swelling or pain.  Xgeva:  Not hungry.  Muscle pain or weakness.  Seizures.  Shortness of breath.  Prolia:  Signs of infection. These include a fever of 100.57F (38C) or higher, chills, very bad sore throat, ear or sinus pain, cough, more sputum or change in color of sputum, pain with passing urine, mouth sores, wound that will not heal, or anal itching or pain.  Signs of a pancreas problem (pancreatitis) like very bad stomach pain, very bad back pain, or very bad upset stomach or throwing up.  Chest pain.  A heartbeat that does not feel normal.  Very bad skin irritation.  Feeling very tired or weak.  Bladder pain or pain when passing urine or change in how much urine is passed.  Passing urine often.  Swelling in the arms or legs. What are some other side effects of this drug?  All drugs may cause side effects. However, many people have no side effects or only have minor side effects. Call your doctor or get medical help if any of these side effects or any other side effects bother you or do not go away:  Xgeva:  Feeling tired or weak.  Headache.  Upset stomach or throwing up.  Loose stools (diarrhea).  Cough.  Prolia:  Back pain.  Muscle or joint pain.  Sore throat.  Runny nose.  Pain in arms or legs.  These are not all of the side effects that may occur. If you have questions about side effects, call your doctor. Call your doctor for medical advice about side effects.  You may report side effects to your national health agency. How is this drug best taken?  Use this drug as ordered by your doctor. Read and follow the dosing on the label closely.  It is given as a shot into the fatty part of the skin. What do I do if I miss a dose?  Call the doctor to find out what to do. How do I store and/or throw  out this drug?  This drug will be given to you in a hospital or doctor's office. You will not store it at home.  Keep all drugs out of the reach of children and pets.  Check with your pharmacist about how to throw out unused drugs.  General drug facts  If your symptoms or health problems do not get better or if they become worse, call your doctor.  Do not share your drugs with others and do not take anyone else's drugs.  Keep a list of all your drugs (prescription, natural products, vitamins, OTC) with you. Give this  list to your doctor.  Talk with the doctor before starting any new drug, including prescription or OTC, natural products, or vitamins.  Some drugs may have another patient information leaflet. If you have any questions about this drug, please talk with your doctor, pharmacist, or other health care provider.  If you think there has been an overdose, call your poison control center or get medical care right away. Be ready to tell or show what was taken, how much, and when it happened.

## 2018-04-30 ENCOUNTER — Encounter: Payer: Self-pay | Admitting: Internal Medicine

## 2018-06-16 ENCOUNTER — Ambulatory Visit: Payer: Medicare Other

## 2018-06-18 ENCOUNTER — Ambulatory Visit: Payer: Medicare Other

## 2018-07-01 ENCOUNTER — Telehealth: Payer: Self-pay

## 2018-07-01 NOTE — Telephone Encounter (Signed)
LMTCB and schedule nurse visit for Prolia- patient owes $100 out-of-pocket at check-in and is ready to be scheduled

## 2018-07-04 ENCOUNTER — Encounter: Payer: Self-pay | Admitting: Internal Medicine

## 2018-07-15 ENCOUNTER — Ambulatory Visit: Payer: Medicare Other

## 2018-07-22 ENCOUNTER — Emergency Department (HOSPITAL_COMMUNITY): Payer: Medicare Other

## 2018-07-22 ENCOUNTER — Emergency Department (HOSPITAL_COMMUNITY)
Admission: EM | Admit: 2018-07-22 | Discharge: 2018-07-22 | Disposition: A | Discharge status: Home / Self Care | Payer: Medicare Other | Attending: Emergency Medicine | Admitting: Emergency Medicine

## 2018-07-22 ENCOUNTER — Encounter (HOSPITAL_COMMUNITY): Payer: Self-pay

## 2018-07-22 DIAGNOSIS — N3000 Acute cystitis without hematuria: Secondary | ICD-10-CM | POA: Diagnosis not present

## 2018-07-22 DIAGNOSIS — Z87891 Personal history of nicotine dependence: Secondary | ICD-10-CM | POA: Diagnosis not present

## 2018-07-22 DIAGNOSIS — M545 Low back pain: Secondary | ICD-10-CM | POA: Diagnosis present

## 2018-07-22 DIAGNOSIS — I10 Essential (primary) hypertension: Secondary | ICD-10-CM | POA: Insufficient documentation

## 2018-07-22 DIAGNOSIS — Z79899 Other long term (current) drug therapy: Secondary | ICD-10-CM | POA: Diagnosis not present

## 2018-07-22 DIAGNOSIS — M5441 Lumbago with sciatica, right side: Secondary | ICD-10-CM | POA: Insufficient documentation

## 2018-07-22 LAB — CBC WITH DIFFERENTIAL/PLATELET
Abs Immature Granulocytes: 0.02 10*3/uL (ref 0.00–0.07)
Basophils Absolute: 0 10*3/uL (ref 0.0–0.1)
Basophils Relative: 0 %
Eosinophils Absolute: 0.1 10*3/uL (ref 0.0–0.5)
Eosinophils Relative: 1 %
HCT: 35.1 % — ABNORMAL LOW (ref 36.0–46.0)
Hemoglobin: 11.4 g/dL — ABNORMAL LOW (ref 12.0–15.0)
Immature Granulocytes: 0 %
Lymphocytes Relative: 24 %
Lymphs Abs: 1.6 10*3/uL (ref 0.7–4.0)
MCH: 32.5 pg (ref 26.0–34.0)
MCHC: 32.5 g/dL (ref 30.0–36.0)
MCV: 100 fL (ref 80.0–100.0)
Monocytes Absolute: 0.5 10*3/uL (ref 0.1–1.0)
Monocytes Relative: 7 %
Neutro Abs: 4.5 10*3/uL (ref 1.7–7.7)
Neutrophils Relative %: 68 %
Platelets: 244 10*3/uL (ref 150–400)
RBC: 3.51 MIL/uL — ABNORMAL LOW (ref 3.87–5.11)
RDW: 13.4 % (ref 11.5–15.5)
WBC: 6.7 10*3/uL (ref 4.0–10.5)
nRBC: 0 % (ref 0.0–0.2)

## 2018-07-22 LAB — URINALYSIS, ROUTINE W REFLEX MICROSCOPIC
Bacteria, UA: NONE SEEN
Bilirubin Urine: NEGATIVE
Glucose, UA: NEGATIVE mg/dL
Hgb urine dipstick: NEGATIVE
Ketones, ur: NEGATIVE mg/dL
Nitrite: POSITIVE — AB
Protein, ur: NEGATIVE mg/dL
Specific Gravity, Urine: 1.008 (ref 1.005–1.030)
WBC, UA: >50 WBC/hpf — ABNORMAL HIGH (ref 0–5)
pH: 8 (ref 5.0–8.0)

## 2018-07-22 LAB — COMPREHENSIVE METABOLIC PANEL
ALT: 14 U/L (ref 0–44)
AST: 23 U/L (ref 15–41)
Albumin: 4.1 g/dL (ref 3.5–5.0)
Alkaline Phosphatase: 54 U/L (ref 38–126)
Anion gap: 6 (ref 5–15)
BUN: 17 mg/dL (ref 8–23)
CO2: 28 mmol/L (ref 22–32)
Calcium: 9.8 mg/dL (ref 8.9–10.3)
Chloride: 102 mmol/L (ref 98–111)
Creatinine, Ser: 0.78 mg/dL (ref 0.44–1.00)
GFR calc Af Amer: >60 mL/min (ref >60)
GFR calc non Af Amer: >60 mL/min (ref >60)
Glucose, Bld: 89 mg/dL (ref 70–99)
Potassium: 4.1 mmol/L (ref 3.5–5.1)
Sodium: 136 mmol/L (ref 135–145)
Total Bilirubin: 0.5 mg/dL (ref 0.3–1.2)
Total Protein: 6.8 g/dL (ref 6.5–8.1)

## 2018-07-22 MED ORDER — FOSFOMYCIN TROMETHAMINE 3 G PO PACK
3.0000 g | PACK | Freq: Once | ORAL | Status: AC
  Administered 2018-07-22: 3 g via ORAL
  Filled 2018-07-22 (×2): qty 3

## 2018-07-22 MED ORDER — MORPHINE SULFATE (PF) 4 MG/ML IV SOLN
4.0000 mg | Freq: Once | INTRAVENOUS | Status: AC
  Administered 2018-07-22: 4 mg via INTRAVENOUS
  Filled 2018-07-22: qty 1

## 2018-07-22 MED ORDER — LIDOCAINE 5 % EX PTCH: 1.0000 | MEDICATED_PATCH | CUTANEOUS | 0 refills | Status: DC

## 2018-07-22 MED ORDER — ONDANSETRON HCL 4 MG/2ML IJ SOLN
4.0000 mg | Freq: Once | INTRAMUSCULAR | Status: AC
  Administered 2018-07-22: 4 mg via INTRAVENOUS
  Filled 2018-07-22: qty 2

## 2018-07-22 MED ORDER — GADOBUTROL 1 MMOL/ML IV SOLN
5.0000 mL | Freq: Once | INTRAVENOUS | Status: AC | PRN
  Administered 2018-07-22: 5 mL via INTRAVENOUS

## 2018-07-22 MED ORDER — LIDOCAINE 5 % EX PTCH
1.0000 | MEDICATED_PATCH | CUTANEOUS | Status: DC
  Administered 2018-07-22: 1 via TRANSDERMAL
  Filled 2018-07-22: qty 1

## 2018-07-22 NOTE — Discharge Instructions (Signed)
Your MRI images today did not show any acute change or abnormality such as infection.  Your urinalysis does show evidence of infection.  Please use the Lidoderm patches to help with your discomfort and continue your home pain regimen of tramadol.  Please stay hydrated.  You received a dose of antibiotics to treat the UTI.  Please follow-up with your pain team and spine team for further management.  If any symptoms change or worsen, please return to the nearest emergency department.

## 2018-07-22 NOTE — ED Provider Notes (Signed)
[facility] EMERGENCY DEPARTMENT Provider Note   CSN: [id number] Arrival date & time: 07/22/18  1110     History   Chief Complaint Chief Complaint  Patient presents with  . Back Pain  . Leg Pain    HPI Kaitlyn Flowers is a 77 y.o. female.  The history is provided by the patient and medical records. No language interpreter was used.  Back Pain   This is a new problem. The current episode started more than 1 week ago. The problem occurs constantly. The problem has not changed since onset.The pain is associated with no known injury. The pain is present in the lumbar spine. The quality of the pain is described as stabbing and shooting. The pain radiates to the right thigh. The pain is at a severity of 10/10. The pain is severe. The symptoms are aggravated by twisting, bending and certain positions. The pain is the same all the time. Associated symptoms include numbness and weakness. Pertinent negatives include no chest pain, no fever, no headaches, no abdominal pain, no abdominal swelling, no bowel incontinence, no perianal numbness, no bladder incontinence, no dysuria, no pelvic pain and no leg pain.    Past Medical History:  Diagnosis Date  . Anemia   . Osteopenia   . PONV (postoperative nausea and vomiting)   . Seizures ([place])   . 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  . Wears hearing aid     Patient Active Problem List   Diagnosis Date Noted  . Tension-type headache, not intractable 06/11/2017  . Right leg weakness 06/11/2017  . Labile hypertension 09/27/2016  . Autonomic dysfunction 09/27/2016  . Chronic pain syndrome 09/10/2016  . Localization-related idiopathic epilepsy and epileptic syndromes with seizures of localized onset, not intractable, without status epilepticus ([place]) 08/01/2016  . Hyponatremia 04/01/2016  . Aphasia   . Abdominal cramping 01/22/2016  . Nocturnal leg cramps 12/07/2015  . UTI (lower urinary tract  infection) 10/02/2015  . 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 Sacroiliitis ([place]) 06/06/2014  . Lumbar radicular pain 06/06/2014  . Right low back pain 01/07/2014  . Anemia 01/07/2014  . Osteoporosis 12/26/2013  . Glucosuria 12/16/2012  . Tick bite of upper arm 12/16/2012  . UTI (urinary tract infection) 12/16/2012  . Back pain with radiation 06/12/2012  . Plantar wart of right foot 12/29/2011  . 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/11/2011  . Fatigue 11/11/2011  . 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 Urinary frequency 09/02/2011  . Dysuria 09/02/2011  . Medicare annual wellness visit, initial 12/18/2010  . Wears hearing aid 12/18/2010  . Medicare welcome visit 12/18/2010  . Back pain 11/23/2010  . Spinal cord tumor   . ABDOMINAL BRUIT 07/21/2009  . NEOPLASM, SKIN, UNCERTAIN BEHAVIOR 90/30/0923  . HYPERKALEMIA 05/12/2007  . ANEMIA NOS 05/12/2007  . DISORDER, STOMACH FUNCTION NEC 04/07/2007    Past Surgical History:  Procedure Laterality Date  . ABDOMINAL HYSTERECTOMY  1973   1/2 of ovary left cyst on ovary  . APPENDECTOMY     removed with hysterectomy  . LUMBAR WOUND DEBRIDEMENT  12/06/2011   Procedure: LUMBAR WOUND DEBRIDEMENT;  Surgeon: Kristeen Miss, MD;  Location: [facility];  Service: Neurosurgery;  Laterality: N/A;  Repair of Pseudomeningocele Lumbar wound  . ROTATOR CUFF REPAIR     left   . shoulder rotator culff   2006  . spinal cord tumor  1987   2012 october  OB History   None      Home Medications    Prior to Admission medications   Medication Sig Start Date End Date Taking? Authorizing Provider  b complex vitamins capsule Take 1 capsule by mouth daily.    [provider]  CALCIUM PO Take 1,000 Units by mouth daily.     [provider]  [facility] ASPIRIN EC 325 MG EC tablet TAKE 1 TABLET (325 MG TOTAL) BY MOUTH DAILY. 06/02/17   Cameron Sprang, MD  Estradiol 10 MCG TABS vaginal tablet  Place 1 tablet (10 mcg total) vaginally 2 (two) times a week. 04/17/17   Carlyle Basques, MD  lamoTRIgine (LAMICTAL) 25 MG tablet Take 1 tablet (25 mg total) by mouth daily. 06/06/17   Cameron Sprang, MD  MAGNESIUM PO Take 1,200 mg by mouth every evening.    [provider]  Meth-Hyo-M Bl-Na Phos-Ph Sal (URO-MP) 118 MG CAPS Take 118 mg by mouth daily as needed. UTI symptoms 01/11/16   [provider]  Omega-3 Fatty Acids (FISH OIL PO) Take by mouth 2 (two) times daily.    [provider]  Probiotic Product (PROBIOTIC PO) Take by mouth 2 (two) times daily.    [provider]  traMADol Veatrice Bourbon) 50 MG tablet     [provider]    Family History Family History  Problem Relation Age of Onset  . Stroke Mother        2010  . Heart attack Father   . Anesthesia problems Neg Hx     Social History Social History   Tobacco Use  . Smoking status: Former Smoker    Packs/day: 0.25    Years: 2.00    Pack years: 0.50    Types: Cigarettes    Last attempt to quit: 09/03/1967    Years since quitting: 50.9  . Smokeless tobacco: Never Used  . Tobacco comment: quit 48 years   Substance Use Topics  . Alcohol use: Yes    Alcohol/week: 4.0 standard drinks    Types: 4 Glasses of wine per week    Comment: have a glass of wine with dinner   . Drug use: No     Allergies   Ibuprofen-famotidine and Penicillins   Review of Systems Review of Systems  Constitutional: Negative for chills, diaphoresis, fatigue and fever.  HENT: Negative for congestion.   Eyes: Negative for visual disturbance.  Respiratory: Negative for cough, chest tightness, shortness of breath and wheezing.   Cardiovascular: Negative for chest pain, palpitations and leg swelling.  Gastrointestinal: Negative for abdominal pain, bowel incontinence, constipation, diarrhea, nausea and vomiting.  Genitourinary: Negative for bladder incontinence, dysuria, flank pain and pelvic pain.    Musculoskeletal: Positive for back pain. Negative for neck pain and neck stiffness.  Skin: Negative for rash and wound.  Neurological: Positive for weakness and numbness. Negative for dizziness, light-headedness and headaches.  Psychiatric/Behavioral: Negative for agitation.  All other systems reviewed and are negative.    Physical Exam Updated Vital Signs LMP  (LMP Unknown)   Physical Exam  Constitutional: She is oriented to person, place, and time. She appears well-developed and well-nourished. No distress.  HENT:  Head: Normocephalic and atraumatic.  Mouth/Throat: Oropharynx is clear and moist. No oropharyngeal exudate.  Eyes: Pupils are equal, round, and reactive to light. Conjunctivae are normal.  Neck: Normal range of motion. Neck supple.  Cardiovascular: Normal rate and regular rhythm.  No murmur heard. Pulmonary/Chest: Effort normal and breath sounds normal. No  respiratory distress. She has no wheezes. She has no rales. She exhibits no tenderness.  Abdominal: Soft. She exhibits no distension. There is no tenderness.  Musculoskeletal: She exhibits tenderness. She exhibits no edema.       Lumbar back: She exhibits tenderness and pain.       Back:  Neurological: She is alert and oriented to person, place, and time. She is not disoriented. She displays no tremor. A sensory deficit is present. No cranial nerve deficit. She exhibits abnormal muscle tone. Coordination normal. GCS eye subscore is 4. GCS verbal subscore is 5. GCS motor subscore is 6.  Subjective numbness in right leg compared to left.  Slight weakness in right leg compared to left.  Next  Normal gait.  Skin: Skin is warm and dry. Capillary refill takes less than 2 seconds. No rash noted. She is not diaphoretic. No erythema.  Psychiatric: She has a normal mood and affect.  Nursing note and vitals reviewed.    ED Treatments / Results  Labs (all labs ordered are listed, but only abnormal results are  displayed) Labs Reviewed  CBC WITH DIFFERENTIAL/PLATELET - Abnormal; Notable for the following components:      Result Value   RBC 3.51 (*)    Hemoglobin 11.4 (*)    HCT 35.1 (*)    All other components within normal limits  URINALYSIS, ROUTINE W REFLEX MICROSCOPIC - Abnormal; Notable for the following components:   APPearance HAZY (*)    Nitrite POSITIVE (*)    Leukocytes, UA LARGE (*)    WBC, UA >50 (*)    All other components within normal limits  URINE CULTURE  COMPREHENSIVE METABOLIC PANEL    EKG None  Radiology Mr Lumbar Spine W Wo Contrast  Result Date: 07/22/2018 CLINICAL DATA:  Initial evaluation for acute lower back pain with right lower extremity pain and numbness. Recent spinal stimulator device placement. EXAM: MRI LUMBAR SPINE WITHOUT AND WITH CONTRAST TECHNIQUE: Multiplanar and multiecho pulse sequences of the lumbar spine were obtained without and with intravenous contrast. CONTRAST:  5 cc of Gadavist. COMPARISON:  Prior MRI from 04/13/2018 FINDINGS: Segmentation: Normal segmentation. Lowest well-formed disc labeled the L5-S1 level. Alignment: Dextroscoliosis, stable. 5 mm anterolisthesis of L5 on S1 also unchanged. Trace retrolisthesis of L2 on L3. Vertebrae: Vertebral body heights well maintained without evidence for acute or chronic fracture. Postoperative changes from prior posterior laminectomy from T12-L1 again seen, stable. Underlying bone marrow signal intensity within normal limits. No discrete or worrisome osseous lesions. Scattered multilevel reactive endplate changes related underlying scoliotic curvature and degenerative spondylolysis. No other abnormal marrow edema or enhancement. Conus medullaris and cauda equina: Conus extends to the L1 level. Conus and cauda equina appear normal. Paraspinal and other soft tissues: Paraspinous soft tissues within normal limits. Few scattered nonenhancing simple appearing renal cysts noted bilaterally. Visualized visceral  structures otherwise unremarkable. Disc levels: L1-2: Mild diffuse disc bulge with disc desiccation. Mild facet hypertrophy. No significant canal or foraminal stenosis. L2-3: Chronic intervertebral disc space narrowing with diffuse disc bulge and disc desiccation. Reactive endplate changes with marginal endplate osteophytic spurring, greater on the left. Mild facet and ligament flavum hypertrophy. Resultant mild canal with moderate left lateral recess stenosis (descending left L3 nerve root level). Mild left L2 foraminal narrowing. Appearance is stable from previous. L3-4: Chronic intervertebral disc space narrowing with diffuse disc bulge and disc desiccation. Bilateral facet and ligament flavum hypertrophy. Resultant mild to moderate canal with moderate bilateral lateral recess stenosis, worse on the  right. Foramina remain patent. L4-5: Mild diffuse disc bulge with disc desiccation and intervertebral disc space narrowing, asymmetric to the right. Right-sided reactive endplate changes with marginal endplate osteophytic spurring. Moderate facet and ligament flavum hypertrophy, worse on the right. Resultant mild canal with moderate right lateral recess narrowing, stable. Moderate right L4 foraminal narrowing also unchanged. L5-S1: 5 mm anterolisthesis. Associated broad posterior pseudo disc bulge, asymmetric to the right. Moderate facet and ligament flavum hypertrophy. Resultant moderate right lateral recess narrowing with the bulging disc contacting the descending S1 nerve root (series 8, image 29). Moderate to advanced right L5 foraminal stenosis impinging upon the exiting right L5 nerve root (series 7, image 6). Appearance relatively stable from previous. IMPRESSION: 1. No evidence for cord compression or other acute abnormality within the lumbar spine. 2. 5 mm anterolisthesis of L5 on S1 with resultant moderate right foraminal and subarticular stenosis, potentially affecting either the right L5 or descending S1  nerve roots. 3. Disc bulging with facet hypertrophy at L4-5 with resultant moderate right foraminal and lateral recess stenosis, potentially affecting either the right L4 or descending L5 nerve roots. 4. Multifactorial degenerative changes at L2-3 and L3-4 with resultant mild to moderate canal and bilateral lateral recess narrowing as above. 5. Stable postoperative changes from prior laminectomy at T12-L1. 6. Overall, appearance of the lumbar spine is stable as compared to recent MRI from 04/13/2018. Electronically Signed   By: Jeannine Boga M.D.   On: 07/22/2018 14:56    Procedures Procedures (including critical care time)  Medications Ordered in ED Medications  lidocaine (LIDODERM) 5 % 1 patch (1 patch Transdermal Patch Applied 07/22/18 1538)  morphine 4 MG/ML injection 4 mg (4 mg Intravenous Given 07/22/18 1148)  ondansetron (ZOFRAN) injection 4 mg (4 mg Intravenous Given 07/22/18 1146)  gadobutrol (GADAVIST) 1 MMOL/ML injection 5 mL (5 mLs Intravenous Contrast Given 07/22/18 1429)  morphine 4 MG/ML injection 4 mg (4 mg Intravenous Given 07/22/18 1454)  ondansetron (ZOFRAN) injection 4 mg (4 mg Intravenous Given 07/22/18 1451)  fosfomycin (MONUROL) packet 3 g (3 g Oral Given 07/22/18 1621)     Initial Impression / Assessment and Plan / ED Course  I have reviewed the triage vital signs and the nursing notes.  Pertinent labs & imaging results that were available during my care of the patient were reviewed by me and considered in my medical decision making (see chart for details).     Kaitlyn Flowers is a 77 y.o. 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  Patient reports that several weeks ago she had  a lumbar spine stimulator placed to help with chronic back pain.  She reports that since that time she has had gradually worsening back pain.  She reports is 9 9 out of 10 and worse than before the stimulator.  She reports she has had right leg numbness that has been worsening the entire right leg.  She reports the pain radiates down her right leg.  She reports that she intimately has weakness in the right leg and is persistent currently.  She denies recent trauma or injuries.  She denies any urinary symptoms, fevers, chills, chest pain, shortness of breath, nausea, vomiting, or abdominal pain.   On exam, patient has low back tenderness.  Patient has surgical wounds that appear well-healing and are not erythematous.  No drainage seen.  Patient had negative straight leg raise on the right but had 4 out of 5 weakness on the right compared to left.  Patient had decreased sensation on the right compared to left.  Normal pulses in both DP arteries.  No hip tenderness.  Lungs clear chest is nontender.  Upper back nontender.  Due to report that she just had a procedure on her low back with new numbness, intermittent weakness, and worsened pain, patient will have MRI with and without contrast to look both for nerve impingement injury or infection.  Patient will have screen laboratory testing and be given pain and nausea medicine.    Anticipate reassessment after work-up.  MRI showed no significant changes from prior.  No acute abnormality seen.  Patient has degenerative disease and other abnormalities but no evidence of new infection after her stimulator procedure.  Patient was however found to have urinary tract infection.  Patient given dose of fosfomycin given her reported history of resistant bacteria in the past.  Patient was able to ambulate after a Lidoderm patch was placed.  Patient will add this to her pain regimen at home.  Patient will follow-up with her spine team and her pain team.  Patient  understood return precautions for new or worsened symptoms.  Patient discharged in good condition with improved symptoms.  Final Clinical Impressions(s) / ED Diagnoses   Final diagnoses:  Acute right-sided low back pain with right-sided sciatica  Acute cystitis without hematuria    ED Discharge Orders         Ordered    lidocaine (LIDODERM) 5 %  Every 24 hours     07/22/18 1535         Clinical Impression: 1. Acute right-sided low back pain with right-sided sciatica   2. Acute cystitis without hematuria     Disposition: Discharge  Condition: Good  I have discussed the results, Dx and Tx plan with the pt(& family if present). He/she/they expressed understanding and agree(s) with the plan. Discharge instructions discussed at great length. Strict return precautions discussed and pt &/or family have verbalized understanding of the instructions. No further questions at time of discharge.    Discharge Medication List as of 07/22/2018  3:36 PM    START taking these medications   Details  lidocaine (LIDODERM) 5 % Place 1 patch onto the skin daily. Remove & Discard patch within 12 hours or as directed by MD, Starting Wed 07/22/2018, Print        Follow Up: Your pain MD and your spine MD     [place] [address] [id number] mc [place]       T[place]eler, Gwenyth Allegra, MD 07/22/18 [phone]

## 2018-07-22 NOTE — ED Triage Notes (Signed)
Pt from home; c/o lower back and shooting R leg/foot pain and worsening R leg numbness; pt endorses chronic back pain; spinal core stimulator placed several weeks ago, pain and numbness worsening since placement; pt referred by MD at [facility] to ED for further evaluation; denies fever, chills, loss of bowel/bladder control, trauma, or cp

## 2018-07-22 NOTE — ED Notes (Addendum)
Per EDP request, called Radiology and s/w Malia to request the pt's scan(MRI of Lumbar Spine) onto a disk. Bettey Mare stated this should take approx 30 mins. EDP and pt made aware.

## 2018-07-22 NOTE — ED Notes (Signed)
Pt aware that urine sample is needed, but is unable to provide one at this time 

## 2018-07-22 NOTE — ED Notes (Signed)
Patient transported to MRI 

## 2018-07-23 ENCOUNTER — Ambulatory Visit: Payer: Medicare Other

## 2018-07-23 LAB — URINE CULTURE: Culture: >=100000 — AB

## 2018-07-24 ENCOUNTER — Telehealth: Payer: Self-pay | Admitting: Emergency Medicine

## 2018-07-24 NOTE — Telephone Encounter (Signed)
Post ED Visit - Positive Culture Follow-up  Culture report reviewed by antimicrobial stewardship pharmacist:  []  Elenor Quinones, Pharm.D. []  Heide Guile, Pharm.D., BCPS AQ-ID []  Parks Neptune, Pharm.D., BCPS []  Alycia Rossetti, Pharm.D., BCPS []  [place], Pharm.D., BCPS, AAHIVP []  Legrand Como, Pharm.D., BCPS, AAHIVP [x]  Salome Arnt, PharmD, BCPS []  Johnnette Gourd, PharmD, BCPS []  Hughes Better, PharmD, BCPS []  Leeroy Cha, PharmD  Positive urine culture Treated with fosfomycin, organism sensitive to the same and no further patient follow-up is required at this time.  Hazle Nordmann 07/24/2018, 11:44 AM

## 2018-07-27 ENCOUNTER — Other Ambulatory Visit: Payer: Self-pay | Admitting: Internal Medicine

## 2018-07-27 MED ORDER — SULFAMETHOXAZOLE-TRIMETHOPRIM 800-160 MG PO TABS: 1.0000 | ORAL_TABLET | Freq: Two times a day (BID) | ORAL | 0 refills | Status: DC

## 2018-08-11 ENCOUNTER — Ambulatory Visit: Payer: Medicare Other

## 2018-08-18 ENCOUNTER — Ambulatory Visit: Payer: Medicare Other

## 2018-08-27 ENCOUNTER — Other Ambulatory Visit: Payer: Medicare Other

## 2018-08-27 DIAGNOSIS — N3000 Acute cystitis without hematuria: Secondary | ICD-10-CM

## 2018-08-28 ENCOUNTER — Telehealth: Payer: Self-pay | Admitting: Behavioral Health

## 2018-08-28 LAB — URINALYSIS, ROUTINE W REFLEX MICROSCOPIC
Bilirubin Urine: NEGATIVE
Glucose, UA: NEGATIVE
Hgb urine dipstick: NEGATIVE
Ketones, ur: NEGATIVE
Leukocytes, UA: NEGATIVE
Nitrite: NEGATIVE
Protein, ur: NEGATIVE
Specific Gravity, Urine: 1.009 (ref 1.001–1.03)
pH: 6 (ref 5.0–8.0)

## 2018-08-28 LAB — URINE CULTURE
MICRO NUMBER:: 91540871
SPECIMEN QUALITY:: ADEQUATE

## 2018-08-28 NOTE — Telephone Encounter (Addendum)
Patient called for results of urinalysis and urine culture.  Urinalysis appears negative.  Patient states she is sure she has a urinary tract infection and does not want to go through the weekend uncomfortable.  [facility] [address]. Pricilla Riffle RN

## 2018-09-01 ENCOUNTER — Other Ambulatory Visit: Payer: Self-pay

## 2018-09-01 ENCOUNTER — Encounter

## 2018-09-01 ENCOUNTER — Encounter: Payer: Self-pay | Admitting: Neurology

## 2018-09-01 ENCOUNTER — Ambulatory Visit: Payer: Medicare Other | Admitting: Neurology

## 2018-09-01 VITALS — BP 122/64 | Ht 61.0 in | Wt 108.0 lb

## 2018-09-01 DIAGNOSIS — R29898 Other symptoms and signs involving the musculoskeletal system: Secondary | ICD-10-CM

## 2018-09-01 DIAGNOSIS — G40009 Localization-related (focal) (partial) idiopathic epilepsy and epileptic syndromes with seizures of localized onset, not intractable, without status epilepticus: Secondary | ICD-10-CM

## 2018-09-01 NOTE — Patient Instructions (Signed)
1. Schedule 1-hour EEG. Our office will call you with results and instructions  2. Continue with PT, monitor right leg weakness  3. Follow-up in 1 year, call for any changes

## 2018-09-01 NOTE — Progress Notes (Signed)
NEUROLOGY FOLLOW UP OFFICE NOTE  Kaitlyn Flowers [id number]  DOB: [birth year]  HISTORY OF PRESENT ILLNESS: I had the pleasure of seeing Kaitlyn Flowers in follow-up in the neurology clinic on 09/01/2018. The patient was last seen almost a year ago for seizures. She had a syncopal episode on 03/03/16, then a transient episode of aphasia on 7/24 and again last 07/21/16. Stroke workup in July 2017 was unremarkable, she is taking full dose aspirin. She had an EEG done which showed bilateral temporal slowing, left greater than right, at times sharply contoured. There were a few sharp transients that appeared more epileptogenic over the left frontotemporal region. She has been taking low dose Lamotrigine 50mg  qhs and denies any similar episodes of transient aphasia or loss of consciousness since 07/2016. She denies any headaches, dizziness, vision changes, no falls. Her main concern continues to be her back pain and right leg pain and weakness, with right foot numbness. She can only walk for 10-15 minutes then "there's nothing there, it's dangerous." She denies any falls. She has seen Ortho and Pain Management at [facility], back surgery has been discussed, however she is hesitant. She is walks in the pool, swimming makes the pain worse. She tried a spinal cord stimulator which she did not tolerate, leads were taken out a couple of weeks ago. She is currently on buprenorphine patch and Lyrica. She is noticing significant constipation, but also feels fuzzy-headed and drowsy despite taking Lyrica at night. She denies any falls. She has osteoporosis with plans to start Prolia. She denies any headaches, dizziness, vision changes, dysarthria/dysphagia.   HPI 05/29/2016: This is a very pleasant 77 yo RH woman who presented after a syncopal episode on 03/03/16 then a transient episode of aphasia on 03/25/16. She lives alone and was sitting outside last 03/03/16. She recalls turning around to get water, then blacked out. She  woke up on the ground and found her glasses broken, she hit the right side of her face. No tongue bite/incontinence. She denied any prodromal symptoms, no dizziness or headaches. She had been drinking a lot of water due to recurrent UTIs since January 2017. She went to the ER, it was felt fall was likely a result of orthostatic hypotension from sitting out in the heat. No bloodwork on record from that visit. Then on 03/25/16 she was reading something off her phone and could not make sense of it. She showed the phone to her friend who said it looked fine. Then she found she could not "get/grab my words." Her friend drove her to the ER, she felt her friend was driving the wrong way and she was thinking this, but could not say it. This lasted 20-30 minutes, no associated focal numbness/tingling/weakness, headaches, or dizziness. Her BP on arrival was 139/72. I personally reviewed head CT without contrast which did not show any acute changes. She was found to have a sodium level of 123. She was also diagnosed with another UTI with >100,000 aerococcus urinae. She denies any further recurrence of symptoms. She denies any olfactory/gustatory hallucinations, deja vu, rising epigastric sensation, focal numbness/tingling/weakness, myoclonic jerks. She has chronic back pain and self-catheterizes herself every night. She has chronic right leg numbness and weakness. For the past 1-1/2 years, she has had pain the right sacral area, worsened by sitting. She continues to work with PT.   She underwent a stroke workup, I personally reviewed images available. 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 MRA head did not show any intracranial stenosis. Carotid dopplers did not show any evidence of ICA stenosis bilaterally. Her echocardiogram showed normal EF 60-65%, normal RV size and systolic function, no significant valvular abnormalities, left atrium  normal size. Lipid panel showed an LDL of 114, total cholesterol 241. She started to take a daily baby aspirin.  She saw her PCP on 11/6 reporting random headaches and wondered if related to her BP. She was scheduled for an earlier appointment today, then yesterday had another transient episode of aphasia. 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 When she tried to speak, her words were jumbled. She states the aphasia lasted 30-40 minutes. A friend took her home then to her PCP office at noon, where bloodwork was done. She went to the ER at 11pm, reporting severe headache that started an hour prior. She had an intense pressure and throbbing over the left temporal region, 8/10, with nausea and vomiting. Headache lasted until this morning. No photo/phono/phonophobia. There was no aphasia noted in the ER, headache was treated with IV fluids and APAP. She denied any focal weakness or paresthesias. She denied having a headache with the previous episode of aphasia. She has no prior history of migraines. Over the past 2 weeks, she has been having random headaches localized on different parts of her head, occurring every 3-4 days, resolving after Tylenol intake.   Her mother had a stroke. Otherwise she had a normal birth and early development.  There is no history of febrile convulsions, CNS infections such as meningitis/encephalitis, significant traumatic brain injury, neurosurgical procedures, or family history of seizures.  PAST MEDICAL HISTORY: Past Medical History:  Diagnosis Date  . Anemia   . Osteopenia   . PONV (postoperative nausea and vomiting)   . Seizures ([place])   . 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  . Wears hearing aid     MEDICATIONS:  Outpatient Encounter Medications as of 09/01/2018  Medication Sig  . b complex vitamins capsule Take 1 capsule by mouth daily.  . Buprenorphine 15 MCG/HR  PTWK Place onto the skin.  Marland Kitchen CALCIUM PO Take 1,000 Units by mouth daily.   . CVS ASPIRIN EC 325 MG EC tablet TAKE 1 TABLET (325 MG TOTAL) BY MOUTH DAILY.  Marland Kitchen Estradiol 10 MCG TABS vaginal tablet Place 1 tablet (10 mcg total) vaginally 2 (two) times a week.  . Lactobacillus Rhamnosus, GG, (CULTURELLE) CAPS Take by mouth.  . lamoTRIgine (LAMICTAL) 25 MG tablet Take 1 tablet (25 mg total) by mouth daily.  Marland Kitchen lidocaine (LIDODERM) 5 % Place 1 patch onto the skin daily. Remove & Discard patch within 12 hours or as directed by MD  . MAGNESIUM PO Take 1,200 mg by mouth every evening.  . Meth-Hyo-M Bl-Na Phos-Ph Sal (URO-MP) 118 MG CAPS Take 118 mg by mouth daily as needed. UTI symptoms  . methenamine (HIPREX) 1 g tablet Take by mouth.  . Omega-3 Fatty Acids (FISH OIL PO) Take by mouth 2 (two) times daily.  . pregabalin (LYRICA) 50 MG capsule Take by mouth.  . Probiotic Product (PROBIOTIC PO) Take by mouth 2 (two) times daily.  . silodosin (RAPAFLO) 8 MG CAPS capsule Take 8 mg by mouth daily. with food  . sulfamethoxazole-trimethoprim (BACTRIM DS,SEPTRA DS) 800-160 MG tablet Take 1 tablet by mouth 2 (two) times daily.  . traMADol Veatrice Bourbon)  50 MG tablet    No facility-administered encounter medications on file as of 09/01/2018.      ALLERGIES: Allergies  Allergen Reactions  . Ibuprofen-Famotidine Other (See Comments)    Elevated BP  . Penicillins Rash    FAMILY HISTORY: Family History  Problem Relation Age of Onset  . Stroke Mother        2010  . Heart attack Father   . Anesthesia problems Neg Hx     SOCIAL HISTORY: Social History   Socioeconomic History  . Marital status: Legally Separated    Spouse name: Not on file  . Number of children: Not on file  . Years of education: Not on file  . Highest education level: Not on file  Occupational History  . Not on file  Social Needs  . Financial resource strain: Not on file  . Food insecurity:    Worry: Not on file    Inability: Not  on file  . Transportation needs:    Medical: Not on file    Non-medical: Not on file  Tobacco Use  . Smoking status: Former Smoker    Packs/day: 0.25    Years: 2.00    Pack years: 0.50    Types: Cigarettes    Last attempt to quit: 09/03/1967    Years since quitting: 51.0  . Smokeless tobacco: Never Used  . Tobacco comment: quit 48 years   Substance and Sexual Activity  . Alcohol use: Yes    Alcohol/week: 4.0 standard drinks    Types: 4 Glasses of wine per week    Comment: have a glass of wine with dinner   . Drug use: No  . Sexual activity: Not on file  Lifestyle  . Physical activity:    Days per week: Not on file    Minutes per session: Not on file  . Stress: Not on file  Relationships  . Social connections:    Talks on phone: Not on file    Gets together: Not on file    Attends religious service: Not on file    Active member of club or organization: Not on file    Attends meetings of clubs or organizations: Not on file    Relationship status: Not on file  . Intimate partner violence:    Fear of current or ex partner: Not on file    Emotionally abused: Not on file    Physically abused: Not on file    Forced sexual activity: Not on file  Other Topics Concern  . 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   Born [place]: Constitutional: No fevers, chills, or sweats, no generalized fatigue, change in appetite Eyes: No visual changes, double vision, eye pain Ear, nose and throat: No hearing loss, ear pain, nasal congestion, sore throat Cardiovascular: No chest pain, palpitations Respiratory:  No shortness of breath at rest or with exertion, wheezes GastrointestinaI: No nausea, vomiting, diarrhea, abdominal pain, fecal incontinence Genitourinary:  No dysuria, urinary retention or frequency Musculoskeletal:  + neck pain, back pain Integumentary: No rash,  pruritus, skin lesions Neurological: as above Psychiatric: No depression, insomnia, anxiety Endocrine: No palpitations, fatigue, diaphoresis, mood swings, change in appetite, change in weight, increased thirst Hematologic/Lymphatic:  No anemia, purpura, petechiae. Allergic/Immunologic: no itchy/runny eyes, nasal congestion, recent allergic reactions, rashes  PHYSICAL EXAM: Vitals:   09/01/18 0835  BP: 122/64   General: No acute distress Head:  Normocephalic/atraumatic Neck: supple, no paraspinal tenderness, full range of motion Heart:  Regular rate and rhythm Lungs:  Clear to auscultation bilaterally Back: No paraspinal tenderness Skin/Extremities: No rash, no edema.  Neurological Exam: alert and oriented to person, place, and time. No aphasia or dysarthria. Fund of knowledge is appropriate.  Recent and remote memory are intact. Attention and concentration are normal.    Able to name objects and repeat phrases, read and write. Cranial nerves: Pupils equal, round, reactive to light.  Fundoscopic exam unremarkable, no papilledema. Extraocular movements intact with no nystagmus. Visual fields full. Facial sensation intact. No facial asymmetry. Tongue, uvula, palate midline.  Motor: Bulk and tone normal, muscle strength 5/5 on both UE and left LE. 4-/5 right hip flexion and right knee extension, otherwise 5/5. Sensation to pin, cold, vibration but reports tingling with application of cold stimulus. Deep tendon reflexes brisk +2 throughout except for absent right patellar reflex. Toes downgoing.  Finger to nose testing intact.  Gait narrow-based and steady, mild difficulty with tandem walk but able, no ataxia. Romberg negative.  IMPRESSION: This is a pleasant 77 yo RH woman who had a syncopal episode on 03/03/16, then a transient episode of expressive aphasia last 03/25/16. Stroke workup was unremarkable, MRI brain did not show any acute changes, no significant stenosis seen intracranially or with  carotid dopplers. Echo normal. She had another transient episode of aphasia on 07/21/16, this time followed by intense headache. No headache with previous event. Her EEG was abnormal with bilateral temporal slowing and rare left frontotemporal epileptiform discharges. 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 She is on a low dose of Lamotrigine 50mg  qhs and expresses interest to wean off medication now that 2 years seizure-free. A 1-hour EEG will be done, if normal, we discussed weaning off Lamotrigine, as well as risks for breakthrough seizure with any medication adjustment. She continues to deal with a lot of back pain, but now has more right leg weakness. Continue with plans for physical therapy and follow-up with Pain Management and Ortho/Neurosurgery if weakness progresses despite PT. She is aware of [place] driving laws to stop driving after a seizure until 6 months seizure-free. She will follow-up in 1 year and knows to call for any changes.  Thank you for allowing me to participate in her care.  Please do not hesitate to call for any questions or concerns.  The duration of this appointment visit was 30 minutes of face-to-face time with the patient.  Greater than 50% of this time was spent in counseling, explanation of diagnosis, planning of further management, and coordination of care.   Ellouise Newer, M.D.   CC: Dr. Regis Bill

## 2018-09-03 ENCOUNTER — Ambulatory Visit: Payer: Medicare Other

## 2018-09-03 DIAGNOSIS — M810 Age-related osteoporosis without current pathological fracture: Secondary | ICD-10-CM

## 2018-09-03 MED ORDER — DENOSUMAB 60 MG/ML ~~LOC~~ SOSY
60.0000 mg | PREFILLED_SYRINGE | Freq: Once | SUBCUTANEOUS | Status: AC
  Administered 2018-09-03: 60 mg via SUBCUTANEOUS

## 2018-09-03 NOTE — Progress Notes (Signed)
Per orders of Dr. Gherghe injection of Prolia given today by Melissa, Certified Medical Assistant . Patient tolerated injection well.  

## 2018-09-09 ENCOUNTER — Other Ambulatory Visit: Payer: Medicare Other

## 2018-09-14 ENCOUNTER — Ambulatory Visit (INDEPENDENT_AMBULATORY_CARE_PROVIDER_SITE_OTHER): Payer: Medicare Other | Admitting: Neurology

## 2018-09-14 DIAGNOSIS — G40009 Localization-related (focal) (partial) idiopathic epilepsy and epileptic syndromes with seizures of localized onset, not intractable, without status epilepticus: Secondary | ICD-10-CM

## 2018-09-15 ENCOUNTER — Telehealth: Payer: Self-pay

## 2018-09-15 DIAGNOSIS — D489 Neoplasm of uncertain behavior, unspecified: Secondary | ICD-10-CM

## 2018-09-15 NOTE — Telephone Encounter (Signed)
Pt has been informed that the referral has been placed

## 2018-09-15 NOTE — Telephone Encounter (Signed)
Ok to do a referral but she may be able to get her own appt without a referral .   Please  [facility] dermatology,  Surgery [facility] center,   etc

## 2018-09-15 NOTE — Telephone Encounter (Signed)
Copied from [place] [id number]. Topic: Referral - Request for Referral >> Sep 15, 2018 11:19 AM Reyne Dumas L wrote: Has patient seen PCP for this complaint? No - but pt just wants referral doesn't want to be seen by PCP *If NO, is insurance requiring patient see PCP for this issue before PCP can refer them? Referral for which specialty: dermatology Preferred provider/office: no preference Reason for referral: place on nose that keeps changing.  Sometimes dark, sometimes red, sometimes scabby and bleeding.  Pt can be reached at [phone]

## 2018-09-22 NOTE — Procedures (Signed)
ELECTROENCEPHALOGRAM REPORT  Date of Study: 09/14/2018  Patient's Name: Kaitlyn Flowers MRN: [id number] Date of Birth: 12-26-1940  Referring Provider: Dr. Ellouise Newer  Clinical History: This is a 78 year old woman with episodes of expressive aphasia, seizure-free for 2 years, interested in weaning off medication  Medications: LAMICTAL25 MG tablet  LYRICA 50 MG capsule b complex vitamins capsule  Buprenorphine 15 MCG/HR PTWK  CALCIUM PO  CVS ASPIRIN EC 325 MG EC tablet  Estradiol 10 MCG TABS vaginal tablet  CULTURELLE CAPS  LIDODERM 5 %  MAGNESIUM PO  URO-MP 118 MG CAPS  HIPREX 1 g tablet  FISH OIL PO  PROBIOTIC PO  RAPAFLO 8 MG CAPS capsule  BACTRIM DS,SEPTRA DS 800-160 MG tablet  ULTRAM 50 MG tablet   Technical Summary: A multichannel digital 1-hour EEG recording measured by the international 10-20 system with electrodes applied with paste and impedances below 5000 ohms performed in our laboratory with EKG monitoring in an awake and asleep patient.  Hyperventilation and photic stimulation were performed.  The digital EEG was referentially recorded, reformatted, and digitally filtered in a variety of bipolar and referential montages for optimal display.    Description: The patient is awake and asleep during the recording.  During maximal wakefulness, there is a symmetric, medium voltage 9.5-10 Hz posterior dominant rhythm that attenuates with eye opening.  There is frequent independent focal 4-5 Hz theta slowing over the bilateral temporal regions, left greater than right, at times sharply contoured without clear epileptogenic potential. During drowsiness and sleep, there is an increase in theta slowing of the background.  Vertex waves and symmetric sleep spindles were seen.  Hyperventilation and photic stimulation did not elicit any abnormalities.  There were occasional sharp waves over the left anterior temporal region. There were no electrographic seizures seen.    EKG lead  was unremarkable.  Impression: This 1-hour awake and asleep EEG is abnormal due to the presence of: 1. Focal slowing over the bilateral temporal regions, left greater than right 2. Occasional epileptiform discharges over the left anterior temporal region  Clinical Correlation of the above findings indicates focal cerebral dysfunction over the bilateral temporal regions suggestive of underlying structural or physiologic abnormality. There is a tendency for seizures to arise from the left anterior temporal region. Clinical correlation is advised.   Ellouise Newer, M.D.

## 2018-09-24 ENCOUNTER — Telehealth: Payer: Self-pay

## 2018-09-24 NOTE — Telephone Encounter (Signed)
We have not seen patient since 2018 Pt has appt with then in June but she is wanting to be seen sooner and was hoping that we could help to expedite the process.

## 2018-09-24 NOTE — Telephone Encounter (Signed)
Copied from [place] [id number]. Topic: Referral - Request for Referral >> Sep 15, 2018 11:19 AM Kaitlyn Flowers wrote: Has patient seen PCP for this complaint? No - but pt just wants referral doesn't want to be seen by PCP *If NO, is insurance requiring patient see PCP for this issue before PCP can refer them? Referral for which specialty: dermatology Preferred provider/office: no preference Reason for referral: place on nose that keeps changing.  Sometimes dark, sometimes red, sometimes scabby and bleeding.  Pt can be reached at [phone] >> Sep 24, 2018 10:49 AM Kaitlyn Flowers wrote: Pt called in and stated that she called [facility] office and they could not get her in til June.  She stated that she wants to be seen before June.  The spot on her face is growing and is concerned.     Best number  [phone]

## 2018-09-25 NOTE — Telephone Encounter (Signed)
Referral probably help without an ov  To disc urgency of problem . She could consider seeing surgery skin center or other dermatologist

## 2018-09-30 ENCOUNTER — Ambulatory Visit: Payer: Medicare Other | Admitting: Family Medicine

## 2018-09-30 ENCOUNTER — Encounter: Payer: Self-pay | Admitting: Family Medicine

## 2018-09-30 ENCOUNTER — Other Ambulatory Visit: Payer: Self-pay

## 2018-09-30 ENCOUNTER — Ambulatory Visit: Payer: Self-pay | Admitting: *Deleted

## 2018-09-30 VITALS — BP 122/80 | HR 78 | Temp 97.8°F | Ht 61.0 in | Wt 106.6 lb

## 2018-09-30 DIAGNOSIS — M549 Dorsalgia, unspecified: Secondary | ICD-10-CM | POA: Diagnosis not present

## 2018-09-30 DIAGNOSIS — G894 Chronic pain syndrome: Secondary | ICD-10-CM

## 2018-09-30 DIAGNOSIS — R600 Localized edema: Secondary | ICD-10-CM

## 2018-09-30 NOTE — Progress Notes (Signed)
Subjective:     Patient ID: Kaitlyn Flowers, female   DOB: 1941/05/31, 78 y.o.   MRN: [id number]  HPI Patient is seen with some bilateral leg edema left greater than right.  She has chronic back pain and is followed by pain management at [facility].  Few weeks ago she was on Lyrica but had intolerance in terms of ongoing sedation and edema.  She stopped Lyrica and her edema resolved.  She did have some recurrence of edema about a week ago.  No other new medications.  She does not particularly note any pattern in terms of her edema being worse late in the day.  Weight is stable.  Denies exertional dyspnea.  No orthopnea.  Recent albumin 4.1.  No history of chronic kidney disease.  No nonsteroidal use.  Denies any calf or thigh pain.  Chronic problems include chronic back pain, scoliosis, osteoporosis She had echocardiogram 04/25/2016 with ejection fraction 60 to 65% and comment of grade 1 diastolic dysfunction.  Past Medical History:  Diagnosis Date  . Anemia   . Osteopenia   . PONV (postoperative nausea and vomiting)   . Seizures ([place])   . 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  . Wears hearing aid    Past Surgical History:  Procedure Laterality Date  . ABDOMINAL HYSTERECTOMY  1973   1/2 of ovary left cyst on ovary  . APPENDECTOMY     removed with hysterectomy  . LUMBAR WOUND DEBRIDEMENT  12/06/2011   Procedure: LUMBAR WOUND DEBRIDEMENT;  Surgeon: Kristeen Miss, MD;  Location: [facility];  Service: Neurosurgery;  Laterality: N/A;  Repair of Pseudomeningocele Lumbar wound  . ROTATOR CUFF REPAIR     left   . shoulder rotator culff   2006  . spinal cord tumor  1987   2012 october     reports that she quit smoking about 51 years ago. Her smoking use included cigarettes. She has a 0.50 pack-year smoking history. She has never used smokeless tobacco. She reports current alcohol use of about 4.0 standard drinks of alcohol per week. She reports that she does not use  drugs. family history includes Heart attack in her father; Stroke in her mother. Allergies  Allergen Reactions  . Ibuprofen-Famotidine Other (See Comments)    Elevated BP  . Fentanyl Other (See Comments)    Constipates for about 2 weeks  . Penicillins Rash     Review of Systems  Constitutional: Negative for appetite change, fatigue, fever and unexpected weight change.  Eyes: Negative for visual disturbance.  Respiratory: Negative for cough, chest tightness, shortness of breath and wheezing.   Cardiovascular: Positive for leg swelling. Negative for chest pain and palpitations.  Gastrointestinal: Negative for abdominal pain.  Genitourinary: Negative for dysuria.  Musculoskeletal: Positive for back pain.  Neurological: Negative for dizziness, seizures, syncope, weakness, light-headedness and headaches.       Objective:   Physical Exam Constitutional:      Appearance: Normal appearance.  Neck:     Musculoskeletal: Neck supple.  Cardiovascular:     Rate and Rhythm: Normal rate and regular rhythm.  Pulmonary:     Effort: Pulmonary effort is normal.     Breath sounds: Normal breath sounds. No wheezing or rales.  Musculoskeletal:     Comments: She has trace edema left ankle and lower leg.  No edema noted right lower extremity this time though she states this is intermittent.  No calf tenderness.  Feet are warm  to touch with good distal pulses  Skin:    Capillary Refill: Capillary refill takes less than 2 seconds.     Comments: She has some mild varicosities lower legs and ankles bilaterally  Neurological:     Mental Status: She is alert.        Assessment:     Mild bilateral leg edema left greater than right.  She does not have any red flags such as weight gain, orthopnea, exertional dyspnea, calf pain, etc.  Suspect she has some mild venous stasis edema along with some mild diastolic dysfunction.  She was on Lyrica but has been off now for about 3 weeks    Plan:     -We  recommend frequent leg elevation and consider support stockings for some mild compression -Would not recommend any diuretics at this time. -Follow-up with primary for any progressive swelling, dyspnea, or other concerns  Eulas Post MD [facility]

## 2018-09-30 NOTE — Patient Instructions (Signed)
Peripheral Edema  Peripheral edema is swelling that is caused by a buildup of fluid. Peripheral edema most often affects the lower legs, ankles, and feet. It can also develop in the arms, hands, and face. The area of the body that has peripheral edema will look swollen. It may also feel heavy or warm. Your clothes may start to feel tight. Pressing on the area may make a temporary dent in your skin. You may not be able to move your arm or leg as much as usual. There are many causes of peripheral edema. It can be a complication of other diseases, such as congestive heart failure, kidney disease, or a problem with your blood circulation. It also can be a side effect of certain medicines. It often happens to women during pregnancy. Sometimes, the cause is not known. Treating the underlying condition is often the only treatment for peripheral edema. Follow these instructions at home: Pay attention to any changes in your symptoms. Take these actions to help with your discomfort:  Raise (elevate) your legs while you are sitting or lying down.  Move around often to prevent stiffness and to lessen swelling. Do not sit or stand for long periods of time.  Wear support stockings as told by your health care provider.  Follow instructions from your health care provider about limiting salt (sodium) in your diet. Sometimes eating less salt can reduce swelling.  Take over-the-counter and prescription medicines only as told by your health care provider. Your health care provider may prescribe medicine to help your body get rid of excess water (diuretic).  Keep all follow-up visits as told by your health care provider. This is important. Contact a health care provider if:  You have a fever.  Your edema starts suddenly or is getting worse, especially if you are pregnant or have a medical condition.  You have swelling in only one leg.  You have increased swelling and pain in your legs. Get help right away  if:  You develop shortness of breath, especially when you are lying down.  You have pain in your chest or abdomen.  You feel weak.  You faint. This information is not intended to replace advice given to you by your health care provider. Make sure you discuss any questions you have with your health care provider. Document Released: 09/26/2004 Document Revised: 01/22/2016 Document Reviewed: 03/01/2015 Elsevier Interactive Patient Education  2019 [place] legs frequently during the day  Consider support stockings (knee high).   Follow up for any shortness of breath or increased edema.

## 2018-09-30 NOTE — Telephone Encounter (Signed)
Pt called with complaints of swelling of both legs L>R; she says that this started about 3 weeks ago when started on lyrica; this medication was stopped and the swelling got better; the pt said that the left ankle returned almost to normal but the swelling never completely went away; for the past 5 days the swelling in her left ankle/leg has gotten worse; the swelling is in her right ankle; the pt also says that if she is standing for long periods she has  burning and tingling in both lower legs; recommendations made per nurse triage protocol; pt  accepted appointment with Dr Regis Bill, [facility], 10/01/2018 at 0915; she verbalized understanding; will route to office for notification.   Reason for Disposition . [1] MODERATE leg swelling (e.g., swelling extends up to knees) AND [2] new onset or worsening  Answer Assessment - Initial Assessment Questions 1. ONSET: "When did the swelling start?" (e.g., minutes, hours, days)     09/25/2018 2. LOCATION: "What part of the leg is swollen?"  "Are both legs swollen or just one leg?"     Left ankle > right ankle  3. SEVERITY: "How bad is the swelling?" (e.g., localized; mild, moderate, severe)  - Localized - small area of swelling localized to one leg  - MILD pedal edema - swelling limited to foot and ankle, pitting edema < 1/4 inch (6 mm) deep, rest and elevation eliminate most or all swelling  - MODERATE edema - swelling of lower leg to knee, pitting edema > 1/4 inch (6 mm) deep, rest and elevation only partially reduce swelling  - SEVERE edema - swelling extends above knee, facial or hand swelling present      moderate 4. REDNESS: "Does the swelling look red or infected?"     no 5. PAIN: "Is the swelling painful to touch?" If so, ask: "How painful is it?"   (Scale 1-10; mild, moderate or severe)     no 6. FEVER: "Do you have a fever?" If so, ask: "What is it, how was it measured, and when did it start?"      no 7. CAUSE: "What do you think is causing  the leg swelling?"     Not sure 8. MEDICAL HISTORY: "Do you have a history of heart failure, kidney disease, liver failure, or cancer?"     no 9. RECURRENT SYMPTOM: "Have you had leg swelling before?" If so, ask: "When was the last time?" "What happened that time?"     No; swelling happened when she started lyrica 10. OTHER SYMPTOMS: "Do you have any other symptoms?" (e.g., chest pain, difficulty breathing)    burning and tingling in bil lower legs 11. PREGNANCY: "Is there any chance you are pregnant?" "When was your last menstrual period?"       no  Protocols used: LEG SWELLING AND EDEMA-A-AH

## 2018-10-01 ENCOUNTER — Ambulatory Visit: Payer: Medicare Other | Admitting: Internal Medicine

## 2018-10-02 NOTE — Telephone Encounter (Signed)
Referral has been placed to dermatology already

## 2018-11-18 ENCOUNTER — Ambulatory Visit: Payer: Medicare Other

## 2019-03-03 ENCOUNTER — Ambulatory Visit: Payer: Self-pay | Admitting: *Deleted

## 2019-03-03 NOTE — Progress Notes (Signed)
Virtual Visit via Video Note  I connected with@ on 03/05/19 at  9:30 AM EDT by a video enabled telemedicine application and verified that I am speaking with the correct person using two identifiers. Location patient: home Location provider:r home office Persons participating in the virtual visit: patient, provider  [place] national recommendations  regarding COVID 19 pandemic   video visit is advised over in office visit for this patient.  Patient aware  of the limitations of evaluation and management by telemedicine and  availability of in person appointments. and agreed to proceed.   HPI: Kaitlyn Flowers presents for video visit Last visit  With me 2018 2019 Onset a week ago of frequent loose post prandial stooling  With some cramps at times yesterday .  No fever blood vomiting some nausea . No travel new meds recent antibiotics  And no hx of same .  No uti sx  Di have recent steroid injection but no hx of issues with this. No direct covid exp but one in her circle jsut got tested  utd on colonoscopy and released from reg screening .  No weight loss  Noted at this time  ROS: See pertinent positives and negatives per HPI.  Past Medical History:  Diagnosis Date  . Anemia   . Osteopenia   . PONV (postoperative nausea and vomiting)   . Seizures ([place])   . 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  . Wears hearing aid     Past Surgical History:  Procedure Laterality Date  . ABDOMINAL HYSTERECTOMY  1973   1/2 of ovary left cyst on ovary  . APPENDECTOMY     removed with hysterectomy  . LUMBAR WOUND DEBRIDEMENT  12/06/2011   Procedure: LUMBAR WOUND DEBRIDEMENT;  Surgeon: Kristeen Miss, MD;  Location: [facility];  Service: Neurosurgery;  Laterality: N/A;  Repair of Pseudomeningocele Lumbar wound  . ROTATOR CUFF REPAIR     left   . shoulder rotator culff   2006  . spinal cord tumor  1987   2012 october     Family History  Problem Relation Age of Onset  .  Stroke Mother        2010  . Heart attack Father   . Anesthesia problems Neg Hx     Social History   Tobacco Use  . Smoking status: Former Smoker    Packs/day: 0.25    Years: 2.00    Pack years: 0.50    Types: Cigarettes    Quit date: 09/03/1967    Years since quitting: 51.5  . Smokeless tobacco: Never Used  . Tobacco comment: quit 48 years   Substance Use Topics  . Alcohol use: Yes    Alcohol/week: 4.0 standard drinks    Types: 4 Glasses of wine per week    Comment: have a glass of wine with dinner   . Drug use: No      Current Outpatient Medications:  .  b complex vitamins capsule, Take 1 capsule by mouth daily., Disp: , Rfl:  .  Buprenorphine 15 MCG/HR PTWK, Place onto the skin., Disp: , Rfl:  .  CALCIUM PO, Take 1,000 Units by mouth daily. , Disp: , Rfl:  .  [employer] ASPIRIN EC 325 MG EC tablet, TAKE 1 TABLET (325 MG TOTAL) BY MOUTH DAILY., Disp: 30 tablet, Rfl: 7 .  Estradiol 10 MCG TABS vaginal tablet, Place 1 tablet (10 mcg total) vaginally 2 (two) times a week., Disp: 8  tablet, Rfl: 11 .  Lactobacillus Rhamnosus, GG, (CULTURELLE) CAPS, Take by mouth., Disp: , Rfl:  .  lamoTRIgine (LAMICTAL) 25 MG tablet, Take 1 tablet (25 mg total) by mouth daily., Disp: 90 tablet, Rfl: 3 .  lidocaine (LIDODERM) 5 %, Place 1 patch onto the skin daily. Remove & Discard patch within 12 hours or as directed by MD, Disp: 14 patch, Rfl: 0 .  MAGNESIUM PO, Take 1,200 mg by mouth every evening., Disp: , Rfl:  .  Meth-Hyo-M Bl-Na Phos-Ph Sal (URO-MP) 118 MG CAPS, Take 118 mg by mouth daily as needed. UTI symptoms, Disp: , Rfl: 3 .  Omega-3 Fatty Acids (FISH OIL PO), Take by mouth 2 (two) times daily., Disp: , Rfl:  .  Probiotic Product (PROBIOTIC PO), Take by mouth 2 (two) times daily., Disp: , Rfl:  .  silodosin (RAPAFLO) 8 MG CAPS capsule, Take 8 mg by mouth daily. with food, Disp: , Rfl: 0  EXAM: BP Readings from Last 3 Encounters:  09/30/18 122/80  09/01/18 122/64  07/22/18 125/60     VITALS per patient if applicable: GENERAL: alert, oriented, appears well and in no acute distress HEENT: atraumatic, conjunttiva clear, no obvious abnormalities on inspection of external nose and ears NECK: normal movements of the head and neck LUNGS: on inspection no signs of respiratory distress, breathing rate appears normal, no obvious gross SOB, gasping or wheezing CV: no obvious cyanosis nl skin color  MS: moves all visible extremities without noticeable abnormality PSYCH/NEURO: pleasant and cooperative, no obvious depression or anxiety, speech and thought processing grossly intact  ASSESSMENT AND PLAN:  Discussed the following assessment and plan:    ICD-10-CM   1. Acute diarrhea  R19.7   2. Other fatigue  R53.83    Counseled.  Poss acute infectious  No alarm sx  But needs eval if  persistent or progressive  coubt c diff   And no travel. Malaise   No severe and no fever.   poss covid   In social circle   Reasonable to get tested  And if ok then follow for another week  Diet etc  Ok to use imodium as needed  And disc alarm  Sx   Expectant management and discussion of plan and treatment with opportunity to ask questions and all were answered. The patient agreed with the plan and demonstrated an understanding of the instructions.   Advised to call back or seek an in-person evaluation if worsening  or having  further concerns .  Shanon Ace, MD

## 2019-03-03 NOTE — Telephone Encounter (Signed)
Pt scheduled with PCP

## 2019-03-03 NOTE — Telephone Encounter (Signed)
Summary: body aches, diarrhea, fatigue    Patient calling with complaints of diarrhea, body aches, fatigue, and a general feeling of being unwell for x1 week. She is requesting call back from NT to discuss further. Please advise.      Call to patient- she has been having diarrhea for 1 week now- 2-3 times/day- loose stools with no changes or improvement. Patient has been hydrating- but she is starting to feel fatigued and feels it is time to reach out. Call to office for appointment. Reason for Disposition . [1] MILD diarrhea (e.g., 1-3 or more stools than normal in past 24 hours) without known cause AND [2] present >  7 days  Answer Assessment - Initial Assessment Questions 1. DIARRHEA SEVERITY: "How bad is the diarrhea?" "How many extra stools have you had in the past 24 hours than normal?"    - NO DIARRHEA (SCALE 0)   - MILD (SCALE 1-3): Few loose or mushy BMs; increase of 1-3 stools over normal daily number of stools; mild increase in ostomy output.   -  MODERATE (SCALE 4-7): Increase of 4-6 stools daily over normal; moderate increase in ostomy output. * SEVERE (SCALE 8-10; OR 'WORST POSSIBLE'): Increase of 7 or more stools daily over normal; moderate increase in ostomy output; incontinence.     mild 2. ONSET: "When did the diarrhea begin?"      1 week 3. BM CONSISTENCY: "How loose or watery is the diarrhea?"      loose 4. VOMITING: "Are you also vomiting?" If so, ask: "How many times in the past 24 hours?"      No- some nausea 5. ABDOMINAL PAIN: "Are you having any abdominal pain?" If yes: "What does it feel like?" (e.g., crampy, dull, intermittent, constant)      no 6. ABDOMINAL PAIN SEVERITY: If present, ask: "How bad is the pain?"  (e.g., Scale 1-10; mild, moderate, or severe)   - MILD (1-3): doesn't interfere with normal activities, abdomen soft and not tender to touch    - MODERATE (4-7): interferes with normal activities or awakens from sleep, tender to touch    - SEVERE (8-10):  excruciating pain, doubled over, unable to do any normal activities       n/a 7. ORAL INTAKE: If vomiting, "Have you been able to drink liquids?" "How much fluids have you had in the past 24 hours?"     Patient is hydrating- 60 oz 8. HYDRATION: "Any signs of dehydration?" (e.g., dry mouth [not just dry lips], too weak to stand, dizziness, new weight loss) "When did you last urinate?"     Tired, no changes in urination- harder to start stream- some burning with stream 9. EXPOSURE: "Have you traveled to a foreign country recently?" "Have you been exposed to anyone with diarrhea?" "Could you have eaten any food that was spoiled?"     Friend has been ill with intestinal virus within past week, friend tested + COVID- 6 weeks ago 10. ANTIBIOTIC USE: "Are you taking antibiotics now or have you taken antibiotics in the past 2 months?"       no 11. OTHER SYMPTOMS: "Do you have any other symptoms?" (e.g., fever, blood in stool)       Body aches 12. PREGNANCY: "Is there any chance you are pregnant?" "When was your last menstrual period?"       n/a  Protocols used: DIARRHEA-A-AH

## 2019-03-04 ENCOUNTER — Other Ambulatory Visit: Payer: Self-pay

## 2019-03-04 ENCOUNTER — Encounter: Payer: Self-pay | Admitting: Internal Medicine

## 2019-03-04 ENCOUNTER — Telehealth: Payer: Self-pay | Admitting: General Practice

## 2019-03-04 ENCOUNTER — Ambulatory Visit (INDEPENDENT_AMBULATORY_CARE_PROVIDER_SITE_OTHER): Payer: Medicare Other | Admitting: Internal Medicine

## 2019-03-04 DIAGNOSIS — R5383 Other fatigue: Secondary | ICD-10-CM

## 2019-03-04 DIAGNOSIS — R197 Diarrhea, unspecified: Secondary | ICD-10-CM

## 2019-03-04 DIAGNOSIS — Z20822 Contact with and (suspected) exposure to covid-19: Secondary | ICD-10-CM

## 2019-03-04 NOTE — Addendum Note (Signed)
Addended by: Dimple Nanas on: 03/04/2019 01:13 PM   Modules accepted: Orders

## 2019-03-04 NOTE — Telephone Encounter (Signed)
Please call pt back to schedule.  I do not see an order, and unable to reach anyone. Pt is at home now.

## 2019-03-04 NOTE — Telephone Encounter (Signed)
Pt has been scheduled for covid testing.  Pt was referred by: Shanon Ace MD

## 2019-03-04 NOTE — Telephone Encounter (Signed)
lvm for pt to return call to schedule covid testing.

## 2019-03-08 ENCOUNTER — Other Ambulatory Visit: Payer: Medicare Other

## 2019-03-08 DIAGNOSIS — Z20822 Contact with and (suspected) exposure to covid-19: Secondary | ICD-10-CM

## 2019-03-11 NOTE — Progress Notes (Signed)
Virtual Visit via Video Note  I connected with@ on 03/12/19 at 11:15 AM EDT by a video enabled telemedicine application and verified that I am speaking with the correct person using two identifiers. Location patient: home Location provider:work office Persons participating in the virtual visit: patient, provider  [place] national recommendations  regarding COVID 19 pandemic   video visit is advised over in office visit for this patient.  Patient aware  of the limitations of evaluation and management by telemedicine and  availability of in person appointments. and agreed to proceed.   HPI: Kaitlyn Flowers presents for video visit    Fu change  in in bowel habits  Loose stools   Since last week got better  And felt normal for 3 days and then in last 3 days  Having loose stools again  In am and after eating about 3 x per day  No watery , fever blood abd pain or nausea vomiting .  NOt sure   If should be concern about anything else.   Pet dog had similar went to vet had some medication better and now has diarrhea again even at night.    Had covid  testing and not heard about results .   Weight down to 103 but feels not going down anumore and better  ROS: See pertinent positives and negatives per HPI.  Past Medical History:  Diagnosis Date  . Anemia   . Osteopenia   . PONV (postoperative nausea and vomiting)   . Seizures ([place])   . 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  . Wears hearing aid     Past Surgical History:  Procedure Laterality Date  . ABDOMINAL HYSTERECTOMY  1973   1/2 of ovary left cyst on ovary  . APPENDECTOMY     removed with hysterectomy  . LUMBAR WOUND DEBRIDEMENT  12/06/2011   Procedure: LUMBAR WOUND DEBRIDEMENT;  Surgeon: Kristeen Miss, MD;  Location: [facility];  Service: Neurosurgery;  Laterality: N/A;  Repair of Pseudomeningocele Lumbar wound  . ROTATOR CUFF REPAIR     left   . shoulder rotator culff   2006  . spinal cord tumor   1987   2012 october     Family History  Problem Relation Age of Onset  . Stroke Mother        2010  . Heart attack Father   . Anesthesia problems Neg Hx     Social History   Tobacco Use  . Smoking status: Former Smoker    Packs/day: 0.25    Years: 2.00    Pack years: 0.50    Types: Cigarettes    Quit date: 09/03/1967    Years since quitting: 51.5  . Smokeless tobacco: Never Used  . Tobacco comment: quit 48 years   Substance Use Topics  . Alcohol use: Yes    Alcohol/week: 4.0 standard drinks    Types: 4 Glasses of wine per week    Comment: have a glass of wine with dinner   . Drug use: No      Current Outpatient Medications:  .  b complex vitamins capsule, Take 1 capsule by mouth daily., Disp: , Rfl:  .  Buprenorphine 15 MCG/HR PTWK, Place onto the skin., Disp: , Rfl:  .  CALCIUM PO, Take 1,000 Units by mouth daily. , Disp: , Rfl:  .  CVS ASPIRIN EC 325 MG EC tablet, TAKE 1 TABLET (325 MG TOTAL) BY MOUTH DAILY., Disp: 30  tablet, Rfl: 7 .  Estradiol 10 MCG TABS vaginal tablet, Place 1 tablet (10 mcg total) vaginally 2 (two) times a week., Disp: 8 tablet, Rfl: 11 .  Lactobacillus Rhamnosus, GG, (CULTURELLE) CAPS, Take by mouth., Disp: , Rfl:  .  lamoTRIgine (LAMICTAL) 25 MG tablet, Take 1 tablet (25 mg total) by mouth daily., Disp: 90 tablet, Rfl: 3 .  lidocaine (LIDODERM) 5 %, Place 1 patch onto the skin daily. Remove & Discard patch within 12 hours or as directed by MD, Disp: 14 patch, Rfl: 0 .  MAGNESIUM PO, Take 1,200 mg by mouth every evening., Disp: , Rfl:  .  Meth-Hyo-M Bl-Na Phos-Ph Sal (URO-MP) 118 MG CAPS, Take 118 mg by mouth daily as needed. UTI symptoms, Disp: , Rfl: 3 .  Omega-3 Fatty Acids (FISH OIL PO), Take by mouth 2 (two) times daily., Disp: , Rfl:  .  Probiotic Product (PROBIOTIC PO), Take by mouth 2 (two) times daily., Disp: , Rfl:  .  silodosin (RAPAFLO) 8 MG CAPS capsule, Take 8 mg by mouth daily. with food, Disp: , Rfl: 0  EXAM: BP Readings from  Last 3 Encounters:  09/30/18 122/80  09/01/18 122/64  07/22/18 125/60    VITALS per patient if applicable: vido visit changed into phone viist for technical reasons  sasys weight 103  GENERAL: alert, oriented, appears well and in no acute distress  HEENT: atraumatic, conjunttiva clear, no obvious abnormalities on inspection of external nose and ears NECK: normal movements of the head and neck LUNGS: on inspection no signs of respiratory distress, breathing rate appears normal, no obvious gross SOB, gasping or wheezing CV: no obvious cyanosis   PSYCH/NEURO: pleasant and cooperative, no obvious depression or anxiety, speech and thought processing grossly intact   ASSESSMENT AND PLAN:  Discussed the following assessment and plan:    ICD-10-CM   1. Acute diarrhea  R19.7 Giardia/cryptosporidium (EIA)    Giardia/cryptosporidium (EIA)  2. Loose stools  R19.5 Giardia/cryptosporidium (EIA)    Giardia/cryptosporidium (EIA)   Poss recovering Gi ilness with IBS sx   Interesting that dog has diarrhea also .  Her genral malaise nausea is gone which may be a sign of convalescence  .    Plan stool test for giardia cryto if possibel could try culturelle  fluoristor albeit iffy   Data behind  Efficacy  Can  Add  metamucil fiber   And then fu if  persistent or progressive  She will check with vet about  Presumed diagnosis and if  Infectious .  Counseled.   Expectant management and discussion of plan and treatment with opportunity to ask questions and all were answered. The patient agreed with the plan and demonstrated an understanding of the instructions.  also sent message  After visit  That   magnesium and associ with loose stools Advised to call back or seek an in-person evaluation if worsening  or having  further concerns . Shanon Ace, MD

## 2019-03-12 ENCOUNTER — Other Ambulatory Visit: Payer: Self-pay

## 2019-03-12 ENCOUNTER — Ambulatory Visit (INDEPENDENT_AMBULATORY_CARE_PROVIDER_SITE_OTHER): Payer: Medicare Other | Admitting: Internal Medicine

## 2019-03-12 ENCOUNTER — Encounter: Payer: Self-pay | Admitting: Internal Medicine

## 2019-03-12 DIAGNOSIS — R197 Diarrhea, unspecified: Secondary | ICD-10-CM

## 2019-03-12 DIAGNOSIS — R195 Other fecal abnormalities: Secondary | ICD-10-CM

## 2019-03-13 LAB — NOVEL CORONAVIRUS, NAA: SARS-CoV-2, NAA: NOT DETECTED

## 2019-03-22 LAB — GIARDIA/CRYPTOSPORIDIUM (EIA)
MICRO NUMBER:: 660525
MICRO NUMBER:: 660526
RESULT:: NOT DETECTED
RESULT:: NOT DETECTED
SPECIMEN QUALITY:: ADEQUATE
SPECIMEN QUALITY:: ADEQUATE

## 2019-03-25 ENCOUNTER — Telehealth: Payer: Self-pay | Admitting: Internal Medicine

## 2019-03-25 NOTE — Telephone Encounter (Signed)
Patient is having surgery done on her back next month and was concerned about her prolia and wanted to see if she could get it done before that.   Please Advise, Thanks

## 2019-03-26 ENCOUNTER — Ambulatory Visit: Payer: Medicare Other

## 2019-03-26 ENCOUNTER — Other Ambulatory Visit: Payer: Self-pay

## 2019-03-26 DIAGNOSIS — M810 Age-related osteoporosis without current pathological fracture: Secondary | ICD-10-CM

## 2019-03-26 MED ORDER — DENOSUMAB 60 MG/ML ~~LOC~~ SOSY
60.0000 mg | PREFILLED_SYRINGE | Freq: Once | SUBCUTANEOUS | Status: AC
  Administered 2019-03-26: 15:00:00 60 mg via SUBCUTANEOUS

## 2019-03-26 NOTE — Telephone Encounter (Signed)
Patient is scheduled for Prolia injection 03/26/19-she owes $90 at check-in

## 2019-03-26 NOTE — Progress Notes (Signed)
Per orders of Dr. Gherghe injection of Prolia given today by Coren Crownover, Certified Medical Assistant . Patient tolerated injection well.  

## 2019-04-02 ENCOUNTER — Other Ambulatory Visit: Payer: Self-pay | Admitting: *Deleted

## 2019-04-02 ENCOUNTER — Other Ambulatory Visit: Payer: Self-pay | Admitting: Neurological Surgery

## 2019-04-05 ENCOUNTER — Encounter: Payer: Self-pay | Admitting: Vascular Surgery

## 2019-04-19 ENCOUNTER — Telehealth: Payer: Self-pay | Admitting: *Deleted

## 2019-04-19 ENCOUNTER — Other Ambulatory Visit: Payer: Medicare Other

## 2019-04-19 ENCOUNTER — Telehealth (INDEPENDENT_AMBULATORY_CARE_PROVIDER_SITE_OTHER): Payer: Medicare Other | Admitting: Family Medicine

## 2019-04-19 ENCOUNTER — Other Ambulatory Visit: Payer: Self-pay

## 2019-04-19 DIAGNOSIS — R300 Dysuria: Secondary | ICD-10-CM | POA: Diagnosis not present

## 2019-04-19 MED ORDER — SULFAMETHOXAZOLE-TRIMETHOPRIM 800-160 MG PO TABS: 1.0000 | ORAL_TABLET | Freq: Two times a day (BID) | ORAL | 0 refills | Status: AC

## 2019-04-19 NOTE — Telephone Encounter (Signed)
Patient called to report that she is having painful urination, hard to start and little comes out but she feels full. She advised she knows she has a UTI. Advised patient she has not been seen here since 2018 and the last 2 test we did on urine were negative so she should contact her PCP and if she needs to be seen her they can refer her. She asked that I ask Dr Baxter Flattery and let her know what she says.

## 2019-04-19 NOTE — Progress Notes (Signed)
Virtual Visit via Video Note  I connected with Kaitlyn Flowers on 04/19/19 at  3:00 PM EDT by a video enabled telemedicine application 2/2 OMVEH-20 pandemic and verified that I am speaking with the correct person using two identifiers.  Location patient: home Location provider:work or home office Persons participating in the virtual visit: patient, provider  I discussed the limitations of evaluation and management by telemedicine and the availability of in person appointments. The patient expressed understanding and agreed to proceed.   HPI: Pt feels like she is getting a UTI.  Was seen in the past by ID for UTIs.  Has dysuria, difficulty starting stream, feeling of "drops of urine that will go but don't".  Symptoms x 1 wk.  Denies fever, chills, back pain that differs from her normal.  Drinks 40-60 oz of water per day.     ROS: See pertinent positives and negatives per HPI.  Past Medical History:  Diagnosis Date  . Anemia   . Arthritis   . Back pain   . Cancer ([place])   . Leg weakness   . Osteopenia   . PONV (postoperative nausea and vomiting)   . Right leg pain   . Seizures ([place])   . 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  . Urinary tract infection   . Wears hearing aid     Past Surgical History:  Procedure Laterality Date  . ABDOMINAL HYSTERECTOMY  1973   1/2 of ovary left cyst on ovary  . APPENDECTOMY     removed with hysterectomy  . LUMBAR WOUND DEBRIDEMENT  12/06/2011   Procedure: LUMBAR WOUND DEBRIDEMENT;  Surgeon: Kristeen Miss, MD;  Location: [facility];  Service: Neurosurgery;  Laterality: N/A;  Repair of Pseudomeningocele Lumbar wound  . ROTATOR CUFF REPAIR     left   . shoulder rotator culff   2006  . spinal cord tumor  1987   2012 october     Family History  Problem Relation Age of Onset  . Stroke Mother        2010  . Heart attack Father   . Anesthesia problems Neg Hx      Current Outpatient  Medications:  .  b complex vitamins capsule, Take 1 capsule by mouth daily., Disp: , Rfl:  .  Buprenorphine 15 MCG/HR PTWK, Place onto the skin., Disp: , Rfl:  .  CALCIUM PO, Take 1,000 Units by mouth daily. , Disp: , Rfl:  .  CVS ASPIRIN EC 325 MG EC tablet, TAKE 1 TABLET (325 MG TOTAL) BY MOUTH DAILY., Disp: 30 tablet, Rfl: 7 .  Estradiol 10 MCG TABS vaginal tablet, Place 1 tablet (10 mcg total) vaginally 2 (two) times a week., Disp: 8 tablet, Rfl: 11 .  Lactobacillus Rhamnosus, GG, (CULTURELLE) CAPS, Take by mouth., Disp: , Rfl:  .  lamoTRIgine (LAMICTAL) 25 MG tablet, Take 1 tablet (25 mg total) by mouth daily., Disp: 90 tablet, Rfl: 3 .  lidocaine (LIDODERM) 5 %, Place 1 patch onto the skin daily. Remove & Discard patch within 12 hours or as directed by MD, Disp: 14 patch, Rfl: 0 .  MAGNESIUM PO, Take 1,200 mg by mouth every evening., Disp: , Rfl:  .  Meth-Hyo-M Bl-Na Phos-Ph Sal (URO-MP) 118 MG CAPS, Take 118 mg by mouth daily as needed. UTI symptoms, Disp: , Rfl: 3 .  Omega-3 Fatty Acids (FISH OIL PO), Take by mouth 2 (two) times daily., Disp: , Rfl:  .  Probiotic Product (PROBIOTIC PO), Take by mouth 2 (two) times daily., Disp: , Rfl:  .  silodosin (RAPAFLO) 8 MG CAPS capsule, Take 8 mg by mouth daily. with food, Disp: , Rfl: 0  EXAM:  VITALS per patient if applicable:  GENERAL: alert, oriented, appears well and in no acute distress  HEENT: atraumatic, conjunctiva clear, no obvious abnormalities on inspection of external nose and ears  NECK: normal movements of the head and neck  LUNGS: on inspection no signs of respiratory distress, breathing rate appears normal, no obvious gross SOB, gasping or wheezing  CV: no obvious cyanosis  MS: moves all visible extremities without noticeable abnormality  PSYCH/NEURO: pleasant and cooperative, no obvious depression or anxiety, speech and thought processing grossly intact  ASSESSMENT AND PLAN:  Discussed the following assessment and  plan:  Dysuria  -Plan: Urine Culture, POCT urinalysis dipstick -discussed starting abx if needed based on UA results.  F/u prn   I discussed the assessment and treatment plan with the patient. The patient was provided an opportunity to ask questions and all were answered. The patient agreed with the plan and demonstrated an understanding of the instructions.   The patient was advised to call back or seek an in-person evaluation if the symptoms worsen or if the condition fails to improve as anticipated.    Billie Ruddy, MD

## 2019-04-19 NOTE — Telephone Encounter (Signed)
Patient returned call and made aware to contact her PCP if she need to be seen then PCP can send referral to [facility].  Eugenia Mcalpine, LPN

## 2019-04-20 LAB — POCT URINALYSIS DIPSTICK
Bilirubin, UA: NEGATIVE
Blood, UA: NEGATIVE
Glucose, UA: NEGATIVE
Ketones, UA: NEGATIVE
Nitrite, UA: NEGATIVE
Odor: NEGATIVE
Protein, UA: NEGATIVE
Spec Grav, UA: 1.015 (ref 1.010–1.025)
Urobilinogen, UA: 0.2 E.U./dL
pH, UA: 6 (ref 5.0–8.0)

## 2019-04-20 NOTE — Addendum Note (Signed)
Addended by: Elmer Picker on: 04/20/2019 12:01 PM   Modules accepted: Orders

## 2019-04-21 ENCOUNTER — Telehealth: Payer: Self-pay

## 2019-04-21 NOTE — Telephone Encounter (Signed)
Copied from [place] [id number]. Topic: General - Inquiry >> Apr 21, 2019 10:58 AM Kaitlyn Flowers, NT wrote: Reason for CRM: Patient called in stating she is waiting on a call from a nurse to go over lab results with her. Please advise. Patient stated it is in regards to her urine specimen.

## 2019-04-22 ENCOUNTER — Telehealth: Payer: Self-pay

## 2019-04-22 LAB — URINE CULTURE
MICRO NUMBER:: 784307
Result:: NO GROWTH
SPECIMEN QUALITY:: ADEQUATE

## 2019-04-22 NOTE — Telephone Encounter (Signed)
Copied from [place] [id number]. Topic: General - Inquiry >> Apr 22, 2019  8:26 AM Celene Kras A wrote: Pt called and is requesting to have her results. Pt is very upset because she has not been contacted. Please advise.  >> Apr 21, 2019  2:54 PM Mcneil, Jacinto Reap wrote: Pt called once again stating she is waiting to hear back from someone regarding the urine culture. Pt stated she needs to know if she will be prescribed a medication or what the next step will be.

## 2019-04-23 NOTE — Telephone Encounter (Signed)
Spoke with pt verbalized understanding that Dr Volanda Napoleon has not viewed the urine results, advised pt that the office will call her with the results when they are received, verbalized understanding

## 2019-04-26 NOTE — Telephone Encounter (Signed)
Pt called requesting to speak with nurse staff about recent labs. Please advise

## 2019-04-28 NOTE — Telephone Encounter (Signed)
Pt stated she has called for the last six days to speak with someone about her lab results. She has seen them on mychart but is not clear on the meaning and stated has asked to speak with a nurse but no one has called her back. She also stated that the medicine she was prescribed sulfamethoxazole-trimethoprim (BACTRIM DS) 800-160 MG tablet Made her sick and she is still experiencing burning and difficulty urinating / please advise

## 2019-04-29 NOTE — Telephone Encounter (Signed)
Spoke with pt advised to stop medication, pt has a virtual visit scheduled with her PCP Dr Regis Bill

## 2019-04-30 ENCOUNTER — Other Ambulatory Visit: Payer: Self-pay

## 2019-04-30 ENCOUNTER — Telehealth (INDEPENDENT_AMBULATORY_CARE_PROVIDER_SITE_OTHER): Payer: Medicare Other | Admitting: Internal Medicine

## 2019-04-30 ENCOUNTER — Encounter: Payer: Self-pay | Admitting: Internal Medicine

## 2019-04-30 DIAGNOSIS — R29898 Other symptoms and signs involving the musculoskeletal system: Secondary | ICD-10-CM | POA: Diagnosis not present

## 2019-04-30 DIAGNOSIS — R399 Unspecified symptoms and signs involving the genitourinary system: Secondary | ICD-10-CM

## 2019-04-30 DIAGNOSIS — M549 Dorsalgia, unspecified: Secondary | ICD-10-CM

## 2019-04-30 DIAGNOSIS — Z87440 Personal history of urinary (tract) infections: Secondary | ICD-10-CM | POA: Diagnosis not present

## 2019-04-30 NOTE — Progress Notes (Signed)
Virtual Visit via Video Note  I connected with@ on 04/30/19 at  2:30 PM EDT by a video enabled telemedicine application and verified that I am speaking with the correct person using two identifiers. Location patient: home Location provider:work office Persons participating in the virtual visit: patient, provider  [place]th national recommendations  regarding COVID 19 pandemic   video visit is advised over in office visit for this patient.  Patient aware  of the limitations of evaluation and management by telemedicine and  availability of in person appointments. and agreed to proceed.   HPI: Kaitlyn Flowers presents for video visit  She is to have  Spine surgery ant L5 S1  fusion in a few weeks and has been having  Intermittent dysuria and urgency  And wants to make sure nothing else before surgeyr  Dr Volanda Napoleon  August 17 vv  u/a had some leuk and  no growth   Had som nausea w bactrim  so stopped?  Also requests   applic for handicapped  Tag for now  Sometimes very hard to walk has weakness in her right leg  Drives  Not all day and very cautions at this time.  ROS: See pertinent positives and negatives per HPI. No fever  falll recently   Past Medical History:  Diagnosis Date  . Anemia   . Arthritis   . Back pain   . Cancer ([place])   . Leg weakness   . Osteopenia   . PONV (postoperative nausea and vomiting)   . Right leg pain   . Seizures ([place])   . 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  . Urinary tract infection   . Wears hearing aid     Past Surgical History:  Procedure Laterality Date  . ABDOMINAL HYSTERECTOMY  1973   1/2 of ovary left cyst on ovary  . APPENDECTOMY     removed with hysterectomy  . LUMBAR WOUND DEBRIDEMENT  12/06/2011   Procedure: LUMBAR WOUND DEBRIDEMENT;  Surgeon: Kristeen Miss, MD;  Location: [facility];  Service: Neurosurgery;  Laterality: N/A;  Repair of Pseudomeningocele Lumbar wound  . ROTATOR CUFF REPAIR     left   . shoulder rotator culff   2006  . spinal cord tumor  1987   2012 october     Family History  Problem Relation Age of Onset  . Stroke Mother        2010  . Heart attack Father   . Anesthesia problems Neg Hx     Social History   Tobacco Use  . Smoking status: Former Smoker    Packs/day: 0.25    Years: 2.00    Pack years: 0.50    Types: Cigarettes    Quit date: 09/03/1967    Years since quitting: 51.6  . Smokeless tobacco: Never Used  . Tobacco comment: quit 48 years   Substance Use Topics  . Alcohol use: Yes    Alcohol/week: 4.0 standard drinks    Types: 4 Glasses of wine per week    Comment: have a glass of wine with dinner   . Drug use: No      Current Outpatient Medications:  .  b complex vitamins capsule, Take 1 capsule by mouth daily., Disp: , Rfl:  .  Buprenorphine 15 MCG/HR PTWK, Place onto the skin., Disp: , Rfl:  .  CALCIUM PO, Take 1,000 Units by mouth daily. , Disp: , Rfl:  .  CVS ASPIRIN  EC 325 MG EC tablet, TAKE 1 TABLET (325 MG TOTAL) BY MOUTH DAILY., Disp: 30 tablet, Rfl: 7 .  Estradiol 10 MCG TABS vaginal tablet, Place 1 tablet (10 mcg total) vaginally 2 (two) times a week., Disp: 8 tablet, Rfl: 11 .  Lactobacillus Rhamnosus, GG, (CULTURELLE) CAPS, Take by mouth., Disp: , Rfl:  .  lamoTRIgine (LAMICTAL) 25 MG tablet, Take 1 tablet (25 mg total) by mouth daily., Disp: 90 tablet, Rfl: 3 .  lidocaine (LIDODERM) 5 %, Place 1 patch onto the skin daily. Remove & Discard patch within 12 hours or as directed by MD, Disp: 14 patch, Rfl: 0 .  MAGNESIUM PO, Take 1,200 mg by mouth every evening., Disp: , Rfl:  .  Meth-Hyo-M Bl-Na Phos-Ph Sal (URO-MP) 118 MG CAPS, Take 118 mg by mouth daily as needed. UTI symptoms, Disp: , Rfl: 3 .  Omega-3 Fatty Acids (FISH OIL PO), Take by mouth 2 (two) times daily., Disp: , Rfl:  .  Probiotic Product (PROBIOTIC PO), Take by mouth 2 (two) times daily., Disp: , Rfl:  .  silodosin (RAPAFLO) 8 MG CAPS capsule, Take 8 mg by mouth  daily. with food, Disp: , Rfl: 0  EXAM: BP Readings from Last 3 Encounters:  09/30/18 122/80  09/01/18 122/64  07/22/18 125/60    VITALS per patient if applicable:  GENERAL: alert, oriented, appears well and in no acute distress  HEENT: atraumatic, conjunttiva clear, no obvious abnormalities on inspection of external nose and ears NECK: normal movements of the head and neck LUNGS: on inspection no signs of respiratory distress, breathing rate appears normal, no obvious gross SOB, gasping or wheezing CV: no obvious cyanosis   PSYCH/NEURO: pleasant and cooperative, no obvious depression or anxiety, speech and thought processing grossly intact Las ucx ng and ua  Green and 1+ leuk   ASSESSMENT AND PLAN:  Discussed the following assessment and plan:    ICD-10-CM   1. Lower urinary tract symptoms (LUTS)  R39.9 Urinalysis with Reflex Microscopic    Urine Culture  2. Hx: UTI (urinary tract infection)  Z87.440 Urinalysis with Reflex Microscopic    Urine Culture  3. Back pain with radiation  M54.9   4. Right leg weakness  R29.898    Intermittent sx but hx   recurrent utis and some resistant   Ones    Repeat  ya with micro next week with cx look at diet foods in case contributing to sx  No fever other alarm sx  Ok to do temp handicapped applic  Disc driving safety pat  Taking precautions Counseled.   Expectant management and discussion of plan and treatment with opportunity to ask questions and all were answered. The patient agreed with the plan and demonstrated an understanding of the instructions. Advised to call back or seek an in-person evaluation if worsening  or having  further concerns .   Shanon Ace, MD

## 2019-05-03 ENCOUNTER — Ambulatory Visit: Payer: Medicare Other | Admitting: Internal Medicine

## 2019-05-05 ENCOUNTER — Encounter: Payer: Self-pay | Admitting: Vascular Surgery

## 2019-05-05 ENCOUNTER — Other Ambulatory Visit: Payer: Self-pay

## 2019-05-05 ENCOUNTER — Ambulatory Visit: Payer: Medicare Other | Admitting: Vascular Surgery

## 2019-05-05 VITALS — BP 126/75 | HR 78 | Temp 97.6°F | Resp 18 | Ht 61.0 in | Wt 105.3 lb

## 2019-05-05 DIAGNOSIS — M5137 Other intervertebral disc degeneration, lumbosacral region: Secondary | ICD-10-CM

## 2019-05-05 DIAGNOSIS — M5136 Other intervertebral disc degeneration, lumbar region: Secondary | ICD-10-CM | POA: Diagnosis not present

## 2019-05-05 NOTE — H&P (View-Only) (Signed)
REASON FOR CONSULT:    To evaluate for anterior retroperitoneal exposure of L5-S1.  The consult is requested by Dr. Ellene Route.  ASSESSMENT & PLAN:   DEGENERATIVE DISC DISEASE L5-S1: This patient appears to be a good candidate for anterior retroperitoneal exposure of L5-S1. I have reviewed our role in exposure of the spine in order to allow anterior lumbar interbody fusion at the appropriate levels. We have discussed the potential complications of surgery, including but not limited to, arterial or venous injury, thrombosis, or bleeding. We have also discussed the potential risks of wound healing problems, the development of a hernia, nerve injury, leg swelling, or other unpredictable medical problems. All the patient's questions were answered and they are agreeable to proceed.  Deitra Mayo, MD, FACS [facility] [phone] Office: [phone]   HPI:   Kaitlyn Flowers is a pleasant 78 y.o. female, with a history of low back pain and pain and numbness in the right leg.  She is felt to be a candidate for anterior lumbar interbody fusion at L5-S1 by Dr. Ellene Route and we have been asked to provide anterior retroperitoneal exposure.  I do not get any history of claudication, rest pain, or nonhealing ulcers.  She has no real risk factors for peripheral vascular disease.  Specifically she denies diabetes, hypertension, hypercholesterolemia, family history of premature cardiovascular disease or tobacco use.  She is had a previous hysterectomy via a lower midline incision.  Past Medical History:  Diagnosis Date  . Anemia   . Arthritis   . Back pain   . Cancer ([place])   . Leg weakness   . Osteopenia   . PONV (postoperative nausea and vomiting)   . Right leg pain   . Seizures ([place])   . 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  . Urinary tract infection   . Wears hearing aid     Family History  Problem Relation Age of Onset  . Stroke Mother    2010  . Heart attack Father   . Anesthesia problems Neg Hx     SOCIAL HISTORY: Social History   Socioeconomic History  . Marital status: Widowed    Spouse name: Not on file  . Number of children: Not on file  . Years of education: Not on file  . Highest education level: Not on file  Occupational History  . Not on file  Social Needs  . Financial resource strain: Not on file  . Food insecurity    Worry: Not on file    Inability: Not on file  . Transportation needs    Medical: Not on file    Non-medical: Not on file  Tobacco Use  . Smoking status: Former Smoker    Packs/day: 0.25    Years: 2.00    Pack years: 0.50    Types: Cigarettes    Quit date: 09/03/1967    Years since quitting: 51.7  . Smokeless tobacco: Never Used  . Tobacco comment: quit 48 years   Substance and Sexual Activity  . Alcohol use: Yes    Alcohol/week: 4.0 standard drinks    Types: 4 Glasses of wine per week    Comment: have a glass of wine with dinner   . Drug use: No  . Sexual activity: Not on file  Lifestyle  . Physical activity    Days per week: Not on file    Minutes per session: Not on file  . Stress: Not on file  Relationships  . Social Herbalist on phone: Not on file    Gets together: Not on file    Attends religious service: Not on file    Active member of club or organization: Not on file    Attends meetings of clubs or organizations: Not on file    Relationship status: Not on file  . Intimate partner violence    Fear of current or ex partner: Not on file    Emotionally abused: Not on file    Physically abused: Not on file    Forced sexual activity: Not on file  Other Topics Concern  . 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 Ibuprofen-Famotidine Other (See Comments)    Elevated BP  . Fentanyl Other (See Comments)     Constipates for about 2 weeks  . Pregabalin Other (See Comments)    AMS, Swelling  . Penicillins Rash    Current Outpatient Medications  Medication Sig Dispense Refill  . b complex vitamins capsule Take 1 capsule by mouth daily.    . Buprenorphine 15 MCG/HR PTWK Place onto the skin.    Marland Kitchen CALCIUM PO Take 1,000 Units by mouth daily.     . CVS ASPIRIN EC 325 MG EC tablet TAKE 1 TABLET (325 MG TOTAL) BY MOUTH DAILY. 30 tablet 7  . Estradiol 10 MCG TABS vaginal tablet Place 1 tablet (10 mcg total) vaginally 2 (two) times a week. 8 tablet 11  . Lactobacillus Rhamnosus, GG, (CULTURELLE) CAPS Take by mouth.    . lamoTRIgine (LAMICTAL) 25 MG tablet Take 1 tablet (25 mg total) by mouth daily. 90 tablet 3  . lidocaine (LIDODERM) 5 % Place 1 patch onto the skin daily. Remove & Discard patch within 12 hours or as directed by MD 14 patch 0  . MAGNESIUM PO Take 1,200 mg by mouth every evening.    . Meth-Hyo-M Bl-Na Phos-Ph Sal (URO-MP) 118 MG CAPS Take 118 mg by mouth daily as needed. UTI symptoms  3  . Omega-3 Fatty Acids (FISH OIL PO) Take by mouth 2 (two) times daily.    . Probiotic Product (PROBIOTIC PO) Take by mouth 2 (two) times daily.    . silodosin (RAPAFLO) 8 MG CAPS capsule Take 8 mg by mouth daily. with food  0   No current facility-administered medications for this visit.     REVIEW OF SYSTEMS:  [X]  denotes positive finding, [ ]  denotes negative finding Cardiac  Comments:  Chest pain or chest pressure:    Shortness of breath upon exertion:    Short of breath when lying flat:    Irregular heart rhythm:        Vascular    Pain in calf, thigh, or hip brought on by ambulation:    Pain in feet at night that wakes you up from your sleep:     Blood clot in your veins:    Leg swelling:         Pulmonary    Oxygen at home:    Productive cough:     Wheezing:         Neurologic    Sudden weakness in arms or legs:     Sudden numbness in arms or legs:     Sudden onset of difficulty  speaking or slurred  speech:    Temporary loss of vision in one eye:     Problems with dizziness:         Gastrointestinal    Blood in stool:     Vomited blood:         Genitourinary    Burning when urinating:     Blood in urine:        Psychiatric    Major depression:         Hematologic    Bleeding problems:    Problems with blood clotting too easily:        Skin    Rashes or ulcers:        Constitutional    Fever or chills:     PHYSICAL EXAM:   Vitals:   05/05/19 0849  BP: 126/75  Pulse: 78  Resp: 18  Temp: 97.6 F (36.4 C)  SpO2: 100%  Weight: 105 lb 4.8 oz (47.8 kg)  Height: 5\' 1"  (1.549 m)    GENERAL: The patient is a well-nourished female, in no acute distress. The vital signs are documented above. CARDIAC: There is a regular rate and rhythm.  VASCULAR: I do not detect carotid bruits. She has palpable femoral, popliteal, and pedal pulses bilaterally. She has no significant lower extremity swelling. PULMONARY: There is good air exchange bilaterally without wheezing or rales. ABDOMEN: Soft and non-tender with normal pitched bowel sounds.  MUSCULOSKELETAL: There are no major deformities or cyanosis. NEUROLOGIC: No focal weakness or paresthesias are detected. SKIN: There are no ulcers or rashes noted. PSYCHIATRIC: The patient has a normal affect.  DATA:    I reviewed her CT of the lumbar spine from 2018.  From an anatomic standpoint I did not see any complicating factors.  I reviewed her MRI more recently it was done in 2020.  This shows significant degenerative disc disease at L5-S1.  There are also changes at L4-L5 and L2-L3 and L3-L4.

## 2019-05-05 NOTE — Progress Notes (Signed)
REASON FOR CONSULT:    To evaluate for anterior retroperitoneal exposure of L5-S1.  The consult is requested by Dr. Ellene Route.  ASSESSMENT & PLAN:   DEGENERATIVE DISC DISEASE L5-S1: This patient appears to be a good candidate for anterior retroperitoneal exposure of L5-S1. I have reviewed our role in exposure of the spine in order to allow anterior lumbar interbody fusion at the appropriate levels. We have discussed the potential complications of surgery, including but not limited to, arterial or venous injury, thrombosis, or bleeding. We have also discussed the potential risks of wound healing problems, the development of a hernia, nerve injury, leg swelling, or other unpredictable medical problems. All the patient's questions were answered and they are agreeable to proceed.  Deitra Mayo, MD, FACS Beeper [phone] Office: [phone]   HPI:   Kaitlyn Flowers is a pleasant 78 y.o. female, with a history of low back pain and pain and numbness in the right leg.  She is felt to be a candidate for anterior lumbar interbody fusion at L5-S1 by Dr. Ellene Route and we have been asked to provide anterior retroperitoneal exposure.  I do not get any history of claudication, rest pain, or nonhealing ulcers.  She has no real risk factors for peripheral vascular disease.  Specifically she denies diabetes, hypertension, hypercholesterolemia, family history of premature cardiovascular disease or tobacco use.  She is had a previous hysterectomy via a lower midline incision.  Past Medical History:  Diagnosis Date  . Anemia   . Arthritis   . Back pain   . Cancer ([place])   . Leg weakness   . Osteopenia   . PONV (postoperative nausea and vomiting)   . Right leg pain   . Seizures ([place])   . 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  . Urinary tract infection   . Wears hearing aid     Family History  Problem Relation Age of Onset  . Stroke Mother    2010  . Heart attack Father   . Anesthesia problems Neg Hx     SOCIAL HISTORY: Social History   Socioeconomic History  . Marital status: Widowed    Spouse name: Not on file  . Number of children: Not on file  . Years of education: Not on file  . Highest education level: Not on file  Occupational History  . Not on file  Social Needs  . Financial resource strain: Not on file  . Food insecurity    Worry: Not on file    Inability: Not on file  . Transportation needs    Medical: Not on file    Non-medical: Not on file  Tobacco Use  . Smoking status: Former Smoker    Packs/day: 0.25    Years: 2.00    Pack years: 0.50    Types: Cigarettes    Quit date: 09/03/1967    Years since quitting: 51.7  . Smokeless tobacco: Never Used  . Tobacco comment: quit 48 years   Substance and Sexual Activity  . Alcohol use: Yes    Alcohol/week: 4.0 standard drinks    Types: 4 Glasses of wine per week    Comment: have a glass of wine with dinner   . Drug use: No  . Sexual activity: Not on file  Lifestyle  . Physical activity    Days per week: Not on file    Minutes per session: Not on file  . Stress: Not on file  Relationships  . Social Herbalist on phone: Not on file    Gets together: Not on file    Attends religious service: Not on file    Active member of club or organization: Not on file    Attends meetings of clubs or organizations: Not on file    Relationship status: Not on file  . Intimate partner violence    Fear of current or ex partner: Not on file    Emotionally abused: Not on file    Physically abused: Not on file    Forced sexual activity: Not on file  Other Topics Concern  . 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 Ibuprofen-Famotidine Other (See Comments)    Elevated BP  . Fentanyl Other (See Comments)     Constipates for about 2 weeks  . Pregabalin Other (See Comments)    AMS, Swelling  . Penicillins Rash    Current Outpatient Medications  Medication Sig Dispense Refill  . b complex vitamins capsule Take 1 capsule by mouth daily.    . Buprenorphine 15 MCG/HR PTWK Place onto the skin.    Marland Kitchen CALCIUM PO Take 1,000 Units by mouth daily.     . [facility] ASPIRIN EC 325 MG EC tablet TAKE 1 TABLET (325 MG TOTAL) BY MOUTH DAILY. 30 tablet 7  . Estradiol 10 MCG TABS vaginal tablet Place 1 tablet (10 mcg total) vaginally 2 (two) times a week. 8 tablet 11  . Lactobacillus Rhamnosus, GG, (CULTURELLE) CAPS Take by mouth.    . lamoTRIgine (LAMICTAL) 25 MG tablet Take 1 tablet (25 mg total) by mouth daily. 90 tablet 3  . lidocaine (LIDODERM) 5 % Place 1 patch onto the skin daily. Remove & Discard patch within 12 hours or as directed by MD 14 patch 0  . MAGNESIUM PO Take 1,200 mg by mouth every evening.    . Meth-Hyo-M Bl-Na Phos-Ph Sal (URO-MP) 118 MG CAPS Take 118 mg by mouth daily as needed. UTI symptoms  3  . Omega-3 Fatty Acids (FISH OIL PO) Take by mouth 2 (two) times daily.    . Probiotic Product (PROBIOTIC PO) Take by mouth 2 (two) times daily.    . silodosin (RAPAFLO) 8 MG CAPS capsule Take 8 mg by mouth daily. with food  0   No current facility-administered medications for this visit.     REVIEW OF SYSTEMS:  [X]  denotes positive finding, [ ]  denotes negative finding Cardiac  Comments:  Chest pain or chest pressure:    Shortness of breath upon exertion:    Short of breath when lying flat:    Irregular heart rhythm:        Vascular    Pain in calf, thigh, or hip brought on by ambulation:    Pain in feet at night that wakes you up from your sleep:     Blood clot in your veins:    Leg swelling:         Pulmonary    Oxygen at home:    Productive cough:     Wheezing:         Neurologic    Sudden weakness in arms or legs:     Sudden numbness in arms or legs:     Sudden onset of difficulty  speaking or slurred  speech:    Temporary loss of vision in one eye:     Problems with dizziness:         Gastrointestinal    Blood in stool:     Vomited blood:         Genitourinary    Burning when urinating:     Blood in urine:        Psychiatric    Major depression:         Hematologic    Bleeding problems:    Problems with blood clotting too easily:        Skin    Rashes or ulcers:        Constitutional    Fever or chills:     PHYSICAL EXAM:   Vitals:   05/05/19 0849  BP: 126/75  Pulse: 78  Resp: 18  Temp: 97.6 F (36.4 C)  SpO2: 100%  Weight: 105 lb 4.8 oz (47.8 kg)  Height: 5\' 1"  (1.549 m)    GENERAL: The patient is a well-nourished female, in no acute distress. The vital signs are documented above. CARDIAC: There is a regular rate and rhythm.  VASCULAR: I do not detect carotid bruits. She has palpable femoral, popliteal, and pedal pulses bilaterally. She has no significant lower extremity swelling. PULMONARY: There is good air exchange bilaterally without wheezing or rales. ABDOMEN: Soft and non-tender with normal pitched bowel sounds.  MUSCULOSKELETAL: There are no major deformities or cyanosis. NEUROLOGIC: No focal weakness or paresthesias are detected. SKIN: There are no ulcers or rashes noted. PSYCHIATRIC: The patient has a normal affect.  DATA:    I reviewed her CT of the lumbar spine from 2018.  From an anatomic standpoint I did not see any complicating factors.  I reviewed her MRI more recently it was done in 2020.  This shows significant degenerative disc disease at L5-S1.  There are also changes at L4-L5 and L2-L3 and L3-L4.

## 2019-05-12 NOTE — Pre-Procedure Instructions (Signed)
[facility] - [place], [place] - [address] [place] [address] Phone: [phone] Fax: [phone]  [id number] [place], [place] [id number] [place] [id number] Phone: [phone] Fax: [phone]      Your procedure is scheduled on  05-17-19 from 0730-1209  Report to [facility] Main Entrance "A" at 0530 A.M., and check in at the Admitting office.  Call this number if you have problems the morning of surgery:  [phone]  Call [phone] if you have any questions prior to your surgery date Monday-Friday 8am-4pm    Remember:  Do not eat or drink after midnight the night before your surgery    Take these medicines the morning of surgery with A SIP OF WATER : traMADol (ULTRAM)  7 days prior to surgery STOP taking any Aspirin (unless otherwise instructed by your surgeon), Aleve, Naproxen, Ibuprofen, Motrin, Advil, Goody's, BC's, all herbal medications, fish oil, and all vitamins.    The Morning of Surgery  Do not wear jewelry, make-up or nail polish.  Do not wear lotions, powders, or perfumes, or deodorant  Do not shave 48 hours prior to surgery.    Do not bring valuables to the hospital.  [facility] is not responsible for any belongings or valuables.  If you are a smoker, DO NOT Smoke 24 hours prior to surgery IF you wear a CPAP at night please bring your mask, tubing, and machine the morning of surgery   Remember that you must have someone to transport you home after your surgery, and remain with you for 24 hours if you are discharged the same day.   Contacts, glasses, hearing aids, dentures or bridgework may not be worn into surgery.    Leave your suitcase in the car.  After surgery it may be brought to your room.  For patients admitted to the hospital, discharge time will be determined by your treatment team.  Patients discharged the  day of surgery will not be allowed to drive home.    Special instructions:   [place]- Preparing For Surgery  Before surgery, you can play an important role. Because skin is not sterile, your skin needs to be as free of germs as possible. You can reduce the number of germs on your skin by washing with CHG (chlorahexidine gluconate) Soap before surgery.  CHG is an antiseptic cleaner which kills germs and bonds with the skin to continue killing germs even after washing.    Oral Hygiene is also important to reduce your risk of infection.  Remember - BRUSH YOUR TEETH THE MORNING OF SURGERY WITH YOUR REGULAR TOOTHPASTE  Please do not use if you have an allergy to CHG or antibacterial soaps. If your skin becomes reddened/irritated stop using the CHG.  Do not shave (including legs and underarms) for at least 48 hours prior to first CHG shower. It is OK to shave your face.  Please follow these instructions carefully.   1. Shower the NIGHT BEFORE SURGERY and the MORNING OF SURGERY with CHG Soap.   2. If you chose to wash your hair, wash your hair first as usual with your normal shampoo.  3. After you shampoo, rinse your hair and body thoroughly to remove the shampoo.  4. Use CHG as you would any other liquid soap. You can apply CHG directly to the skin and wash gently with a scrungie or a clean washcloth.  5. Apply the CHG Soap to your body ONLY FROM THE NECK DOWN.  Do not use on open wounds or open sores. Avoid contact with your eyes, ears, mouth and genitals (private parts). Wash Face and genitals (private parts)  with your normal soap.   6. Wash thoroughly, paying special attention to the area where your surgery will be performed.  7. Thoroughly rinse your body with warm water from the neck down.  8. DO NOT shower/wash with your normal soap after using and rinsing off the CHG Soap.  9. Pat yourself dry with a CLEAN TOWEL.  10. Wear CLEAN PAJAMAS to bed the night before surgery, wear  comfortable clothes the morning of surgery  11. Place CLEAN SHEETS on your bed the night of your first shower and DO NOT SLEEP WITH PETS.    Day of Surgery:  Do not apply any deodorants/lotions. Please shower the morning of surgery with the CHG soap  Please wear clean clothes to the hospital/surgery center.   Remember to brush your teeth WITH YOUR REGULAR TOOTHPASTE.   Please read over the  fact sheets that you were given.

## 2019-05-13 ENCOUNTER — Encounter (HOSPITAL_COMMUNITY): Payer: Self-pay

## 2019-05-13 ENCOUNTER — Other Ambulatory Visit: Payer: Self-pay

## 2019-05-13 ENCOUNTER — Encounter (HOSPITAL_COMMUNITY)
Admission: RE | Admit: 2019-05-13 | Discharge: 2019-05-13 | Disposition: A | Discharge status: Home / Self Care | Payer: Medicare Other | Source: Ambulatory Visit | Attending: Neurological Surgery | Admitting: Neurological Surgery

## 2019-05-13 ENCOUNTER — Other Ambulatory Visit (HOSPITAL_COMMUNITY)
Admission: RE | Admit: 2019-05-13 | Discharge: 2019-05-13 | Disposition: A | Discharge status: Home / Self Care | Payer: Medicare Other | Source: Ambulatory Visit | Attending: Neurological Surgery | Admitting: Neurological Surgery

## 2019-05-13 DIAGNOSIS — Z01812 Encounter for preprocedural laboratory examination: Secondary | ICD-10-CM | POA: Insufficient documentation

## 2019-05-13 DIAGNOSIS — Z20828 Contact with and (suspected) exposure to other viral communicable diseases: Secondary | ICD-10-CM | POA: Diagnosis not present

## 2019-05-13 HISTORY — DX: Depression, unspecified: F32.A

## 2019-05-13 LAB — CBC
HCT: 34.6 % — ABNORMAL LOW (ref 36.0–46.0)
Hemoglobin: 11.4 g/dL — ABNORMAL LOW (ref 12.0–15.0)
MCH: 33.5 pg (ref 26.0–34.0)
MCHC: 32.9 g/dL (ref 30.0–36.0)
MCV: 101.8 fL — ABNORMAL HIGH (ref 80.0–100.0)
Platelets: 242 10*3/uL (ref 150–400)
RBC: 3.4 MIL/uL — ABNORMAL LOW (ref 3.87–5.11)
RDW: 13.3 % (ref 11.5–15.5)
WBC: 7.1 10*3/uL (ref 4.0–10.5)
nRBC: 0 % (ref 0.0–0.2)

## 2019-05-13 LAB — BASIC METABOLIC PANEL
Anion gap: 9 (ref 5–15)
BUN: 16 mg/dL (ref 8–23)
CO2: 25 mmol/L (ref 22–32)
Calcium: 9 mg/dL (ref 8.9–10.3)
Chloride: 101 mmol/L (ref 98–111)
Creatinine, Ser: 0.72 mg/dL (ref 0.44–1.00)
GFR calc Af Amer: >60 mL/min (ref >60)
GFR calc non Af Amer: >60 mL/min (ref >60)
Glucose, Bld: 74 mg/dL (ref 70–99)
Potassium: 4.6 mmol/L (ref 3.5–5.1)
Sodium: 135 mmol/L (ref 135–145)

## 2019-05-13 LAB — TYPE AND SCREEN
ABO/RH(D): O POS
Antibody Screen: NEGATIVE

## 2019-05-13 LAB — SURGICAL PCR SCREEN
MRSA, PCR: NEGATIVE
Staphylococcus aureus: NEGATIVE

## 2019-05-13 NOTE — Pre-Procedure Instructions (Addendum)
Report to [facility] Main Entrance "A" at 0530 A.M on Monday, September 14 and check in at the Admitting office.  Your procedure is scheduled on from 0730-1209    Call this number if you have problems the morning of surgery:  [phone]  Call [phone] if you have any questions prior to your surgery date Monday-Friday 8am-4pm    Remember:  Do not eat or drink after midnight the night before your surgery   Take these medicines the morning of surgery with A SIP OF WATER : traMADol (ULTRAM)  7 days prior to surgery STOP taking any Aspirin (unless otherwise instructed by your surgeon), Aleve, Naproxen, Ibuprofen, Motrin, Advil, Goody's, BC's, all herbal medications, fish oil, and all vitamins.    The Morning of Surgery  Do not wear jewelry, make-up or nail polish.  Do not wear lotions, powders, or perfumes, or deodorant  Do not shave 48 hours prior to surgery.    Do not bring valuables to the hospital.  [facility] is not responsible for any belongings or valuables.  If you are a smoker, DO NOT Smoke 24 hours prior to surgery IF you wear a CPAP at night please bring your mask, tubing, and machine the morning of surgery   Remember that you must have someone to transport you home after your surgery, and remain with you for 24 hours if you are discharged the same day.   Contacts, glasses, hearing aids, dentures or bridgework may not be worn into surgery.    Leave your suitcase in the car.  After surgery it may be brought to your room.  For patients admitted to the hospital, discharge time will be determined by your treatment team.  Patients discharged the day of surgery will not be allowed to drive home.    Special instructions:   [place]- Preparing For Surgery  Before surgery, you can play an important role. Because skin is not sterile, your skin needs to be as free of germs as possible. You can reduce the number of germs on your skin by washing with CHG  (chlorahexidine gluconate) Soap before surgery.  CHG is an antiseptic cleaner which kills germs and bonds with the skin to continue killing germs even after washing.    Oral Hygiene is also important to reduce your risk of infection.  Remember - BRUSH YOUR TEETH THE MORNING OF SURGERY WITH YOUR REGULAR TOOTHPASTE  Please do not use if you have an allergy to CHG or antibacterial soaps. If your skin becomes reddened/irritated stop using the CHG.  Do not shave (including legs and underarms) for at least 48 hours prior to first CHG shower. It is OK to shave your face.  Please follow these instructions carefully.   1. Shower the NIGHT BEFORE SURGERY and the MORNING OF SURGERY with CHG Soap.   2. If you chose to wash your hair, wash your hair first as usual with your normal shampoo.  3. After you shampoo,wash your face and private area with the soap you use at home, then rinse your hair and body thoroughly to remove the shampoo and soap.   4. Use CHG as you would any other liquid soap. You can apply CHG directly to the skin and wash gently with a scrungie or a clean washcloth.   5. Apply the CHG Soap to your body ONLY FROM THE NECK DOWN.  Do not use on open wounds or open sores. Avoid contact with your eyes, ears, mouth and  genitals (private parts).   6. Wash thoroughly, paying special attention to the area where your surgery will be performed.  7. Thoroughly rinse your body with warm water from the neck down.  8. DO NOT shower/wash with your normal soap after using and rinsing off the CHG Soap.  9. Pat yourself dry with a CLEAN TOWEL.  10. Wear CLEAN PAJAMAS to bed the night before surgery, wear comfortable clothes the morning of surgery  11. Place CLEAN SHEETS on your bed the night of your first shower and DO NOT SLEEP WITH PETS.  Day of Surgery: Shower as instructed above. Do not apply any deodorants/lotions. Please shower the morning of surgery with the CHG soap  Please wear clean  clothes to the hospital/surgery center.   Remember to brush your teeth WITH YOUR REGULAR TOOTHPASTE.  Please read over the  fact sheets that you were given.

## 2019-05-13 NOTE — Progress Notes (Addendum)
PCP - Dr. Dorthula Nettles  Cardiologist - no  Neurologist- Dr. Ellouise Newer  Chest x-ray - na  EKG - na  Stress Test - no  ECHO - 2017  Cardiac Cath - no   Sleep Study - no CPAP - no  LABS-CBC, BMP, T/S  ASA-no  ERAS-no  HA1C-na Fasting Blood Sugar - na Checks Blood Sugar ____0_ times a day  Anesthesia-Echo was done in 2017- patient ex[periecned a syncopal episoded,  "it was due to low NA, and has not happened again. " Seizure approximately 2016 - happened 2 times, none since." Pt denies having chest pain, sob, or fever at this time. All instructions explained to the pt, with a verbal understanding of the material. Pt agrees to go over the instructions while at home for a better understanding. Pt also instructed to self quarantine after being tested for COVID-19. The opportunity to ask questions was provided.

## 2019-05-14 LAB — NOVEL CORONAVIRUS, NAA (HOSP ORDER, SEND-OUT TO REF LAB; TAT 18-24 HRS): SARS-CoV-2, NAA: NOT DETECTED

## 2019-05-16 NOTE — Anesthesia Preprocedure Evaluation (Addendum)
Anesthesia Evaluation  Patient identified by MRN, date of birth, ID band Patient awake    Reviewed: Allergy & Precautions, NPO status , Patient's Chart, lab work & pertinent test results  History of Anesthesia Complications (+) PONV and history of anesthetic complications  Airway Mallampati: II  TM Distance: >3 FB Neck ROM: Full    Dental  (+) Dental Advisory Given, Teeth Intact   Pulmonary former smoker,    Pulmonary exam normal        Cardiovascular hypertension, (-) anginaNormal cardiovascular exam     Neuro/Psych  Headaches, Seizures -, Well Controlled,  PSYCHIATRIC DISORDERS Depression  Right foot drop     GI/Hepatic negative GI ROS, Neg liver ROS,   Endo/Other  negative endocrine ROS  Renal/GU negative Renal ROS     Musculoskeletal  (+) Arthritis ,   Abdominal   Peds  Hematology  (+) anemia ,   Anesthesia Other Findings   Reproductive/Obstetrics                            Anesthesia Physical Anesthesia Plan  ASA: III  Anesthesia Plan: General   Post-op Pain Management:    Induction: Intravenous  PONV Risk Score and Plan: 4 or greater and Treatment may vary due to age or medical condition, Ondansetron, Dexamethasone and Propofol infusion  Airway Management Planned: Oral ETT  Additional Equipment: Arterial line  Intra-op Plan:   Post-operative Plan: Extubation in OR  Informed Consent: I have reviewed the patients History and Physical, chart, labs and discussed the procedure including the risks, benefits and alternatives for the proposed anesthesia with the patient or authorized representative who has indicated his/her understanding and acceptance.     Dental advisory given  Plan Discussed with: CRNA and Anesthesiologist  Anesthesia Plan Comments:        Anesthesia Quick Evaluation

## 2019-05-17 ENCOUNTER — Inpatient Hospital Stay (HOSPITAL_COMMUNITY): Payer: Medicare Other

## 2019-05-17 ENCOUNTER — Encounter (HOSPITAL_COMMUNITY): Payer: Self-pay | Admitting: *Deleted

## 2019-05-17 ENCOUNTER — Other Ambulatory Visit: Payer: Self-pay

## 2019-05-17 ENCOUNTER — Encounter (HOSPITAL_COMMUNITY)
Admission: RE | Disposition: A | Discharge status: Home Health | Payer: Self-pay | Source: Home / Self Care | Attending: Neurological Surgery

## 2019-05-17 ENCOUNTER — Inpatient Hospital Stay (HOSPITAL_COMMUNITY)
Admission: RE | Admit: 2019-05-17 | Discharge: 2019-05-19 | DRG: 460 | Disposition: A | Discharge status: Home Health | Payer: Medicare Other | Attending: Neurological Surgery | Admitting: Neurological Surgery

## 2019-05-17 DIAGNOSIS — Z880 Allergy status to penicillin: Secondary | ICD-10-CM | POA: Diagnosis not present

## 2019-05-17 DIAGNOSIS — Z86011 Personal history of benign neoplasm of the brain: Secondary | ICD-10-CM | POA: Diagnosis not present

## 2019-05-17 DIAGNOSIS — Z79899 Other long term (current) drug therapy: Secondary | ICD-10-CM | POA: Diagnosis not present

## 2019-05-17 DIAGNOSIS — I10 Essential (primary) hypertension: Secondary | ICD-10-CM | POA: Diagnosis present

## 2019-05-17 DIAGNOSIS — M5117 Intervertebral disc disorders with radiculopathy, lumbosacral region: Secondary | ICD-10-CM | POA: Diagnosis present

## 2019-05-17 DIAGNOSIS — Z87891 Personal history of nicotine dependence: Secondary | ICD-10-CM | POA: Diagnosis not present

## 2019-05-17 DIAGNOSIS — M4317 Spondylolisthesis, lumbosacral region: Secondary | ICD-10-CM | POA: Diagnosis present

## 2019-05-17 DIAGNOSIS — Z419 Encounter for procedure for purposes other than remedying health state, unspecified: Secondary | ICD-10-CM

## 2019-05-17 DIAGNOSIS — M48061 Spinal stenosis, lumbar region without neurogenic claudication: Secondary | ICD-10-CM | POA: Diagnosis present

## 2019-05-17 DIAGNOSIS — Z90710 Acquired absence of both cervix and uterus: Secondary | ICD-10-CM | POA: Diagnosis not present

## 2019-05-17 DIAGNOSIS — Z79891 Long term (current) use of opiate analgesic: Secondary | ICD-10-CM

## 2019-05-17 DIAGNOSIS — M8580 Other specified disorders of bone density and structure, unspecified site: Secondary | ICD-10-CM | POA: Diagnosis present

## 2019-05-17 DIAGNOSIS — Z974 Presence of external hearing-aid: Secondary | ICD-10-CM

## 2019-05-17 DIAGNOSIS — Z85828 Personal history of other malignant neoplasm of skin: Secondary | ICD-10-CM

## 2019-05-17 DIAGNOSIS — Z8249 Family history of ischemic heart disease and other diseases of the circulatory system: Secondary | ICD-10-CM | POA: Diagnosis not present

## 2019-05-17 DIAGNOSIS — Z888 Allergy status to other drugs, medicaments and biological substances status: Secondary | ICD-10-CM

## 2019-05-17 DIAGNOSIS — F329 Major depressive disorder, single episode, unspecified: Secondary | ICD-10-CM | POA: Diagnosis present

## 2019-05-17 DIAGNOSIS — Z7982 Long term (current) use of aspirin: Secondary | ICD-10-CM

## 2019-05-17 DIAGNOSIS — Z823 Family history of stroke: Secondary | ICD-10-CM

## 2019-05-17 HISTORY — PX: ABDOMINAL EXPOSURE: SHX5708

## 2019-05-17 HISTORY — PX: ANTERIOR LUMBAR FUSION: SHX1170

## 2019-05-17 SURGERY — ANTERIOR LUMBAR FUSION 1 LEVEL
Anesthesia: General

## 2019-05-17 MED ORDER — PROPOFOL 500 MG/50ML IV EMUL
INTRAVENOUS | Status: DC | PRN
  Administered 2019-05-17: 10 ug/kg/min via INTRAVENOUS

## 2019-05-17 MED ORDER — SODIUM CHLORIDE 0.9 % IV SOLN
INTRAVENOUS | Status: DC | PRN
  Administered 2019-05-17: 50 ug/min via INTRAVENOUS

## 2019-05-17 MED ORDER — HYDROMORPHONE HCL 1 MG/ML IJ SOLN
INTRAMUSCULAR | Status: AC
  Filled 2019-05-17: qty 1

## 2019-05-17 MED ORDER — VANCOMYCIN HCL 1000 MG IV SOLR: INTRAVENOUS | Status: DC | PRN

## 2019-05-17 MED ORDER — ACETAMINOPHEN 650 MG RE SUPP: 650.0000 mg | RECTAL | Status: DC | PRN

## 2019-05-17 MED ORDER — ONDANSETRON HCL 4 MG/2ML IJ SOLN: 4.0000 mg | Freq: Once | INTRAMUSCULAR | Status: DC | PRN

## 2019-05-17 MED ORDER — LIDOCAINE-EPINEPHRINE 1 %-1:100000 IJ SOLN
INTRAMUSCULAR | Status: AC
  Filled 2019-05-17: qty 1

## 2019-05-17 MED ORDER — THROMBIN 5000 UNITS EX SOLR
OROMUCOSAL | Status: DC | PRN
  Administered 2019-05-17: 5 mL via TOPICAL

## 2019-05-17 MED ORDER — ESMOLOL HCL 100 MG/10ML IV SOLN
INTRAVENOUS | Status: AC
  Filled 2019-05-17: qty 10

## 2019-05-17 MED ORDER — HYDROMORPHONE HCL 1 MG/ML IJ SOLN
0.5000 mg | INTRAMUSCULAR | Status: DC | PRN
  Administered 2019-05-17 (×2): 0.5 mg via INTRAVENOUS

## 2019-05-17 MED ORDER — FLEET ENEMA 7-19 GM/118ML RE ENEM: 1.0000 | ENEMA | Freq: Once | RECTAL | Status: DC | PRN

## 2019-05-17 MED ORDER — ROCURONIUM BROMIDE 10 MG/ML (PF) SYRINGE
PREFILLED_SYRINGE | INTRAVENOUS | Status: AC
  Filled 2019-05-17: qty 20

## 2019-05-17 MED ORDER — CEFAZOLIN SODIUM-DEXTROSE 2-4 GM/100ML-% IV SOLN: 2.0000 g | Freq: Three times a day (TID) | INTRAVENOUS | Status: DC

## 2019-05-17 MED ORDER — KETOROLAC TROMETHAMINE 30 MG/ML IJ SOLN
INTRAMUSCULAR | Status: AC
  Filled 2019-05-17: qty 1

## 2019-05-17 MED ORDER — FENTANYL CITRATE (PF) 250 MCG/5ML IJ SOLN
INTRAMUSCULAR | Status: AC
  Filled 2019-05-17: qty 5

## 2019-05-17 MED ORDER — PHENYLEPHRINE 40 MCG/ML (10ML) SYRINGE FOR IV PUSH (FOR BLOOD PRESSURE SUPPORT)
PREFILLED_SYRINGE | INTRAVENOUS | Status: DC | PRN
  Administered 2019-05-17 (×2): 80 ug via INTRAVENOUS
  Administered 2019-05-17: 120 ug via INTRAVENOUS
  Administered 2019-05-17: 80 ug via INTRAVENOUS

## 2019-05-17 MED ORDER — ALBUMIN HUMAN 5 % IV SOLN
INTRAVENOUS | Status: DC | PRN
  Administered 2019-05-17: 08:00:00 via INTRAVENOUS

## 2019-05-17 MED ORDER — VANCOMYCIN HCL IN DEXTROSE 1-5 GM/200ML-% IV SOLN
1000.0000 mg | INTRAVENOUS | Status: AC
  Administered 2019-05-17: 1000 mg via INTRAVENOUS
  Filled 2019-05-17: qty 200

## 2019-05-17 MED ORDER — BUPIVACAINE HCL (PF) 0.5 % IJ SOLN
INTRAMUSCULAR | Status: AC
  Filled 2019-05-17: qty 30

## 2019-05-17 MED ORDER — ONDANSETRON HCL 4 MG/2ML IJ SOLN: 4.0000 mg | Freq: Four times a day (QID) | INTRAMUSCULAR | Status: DC | PRN

## 2019-05-17 MED ORDER — 0.9 % SODIUM CHLORIDE (POUR BTL) OPTIME
TOPICAL | Status: DC | PRN
  Administered 2019-05-17: 1000 mL

## 2019-05-17 MED ORDER — LIDOCAINE 2% (20 MG/ML) 5 ML SYRINGE
INTRAMUSCULAR | Status: DC | PRN
  Administered 2019-05-17: 80 mg via INTRAVENOUS

## 2019-05-17 MED ORDER — METHOCARBAMOL 500 MG PO TABS
500.0000 mg | ORAL_TABLET | Freq: Four times a day (QID) | ORAL | Status: DC | PRN
  Administered 2019-05-17 – 2019-05-19 (×3): 500 mg via ORAL
  Filled 2019-05-17 (×3): qty 1

## 2019-05-17 MED ORDER — BUPIVACAINE HCL (PF) 0.5 % IJ SOLN
INTRAMUSCULAR | Status: DC | PRN
  Administered 2019-05-17: 20 mL

## 2019-05-17 MED ORDER — CHLORHEXIDINE GLUCONATE 4 % EX LIQD: 60.0000 mL | Freq: Once | CUTANEOUS | Status: DC

## 2019-05-17 MED ORDER — VANCOMYCIN HCL IN DEXTROSE 750-5 MG/150ML-% IV SOLN
750.0000 mg | Freq: Once | INTRAVENOUS | Status: AC
  Administered 2019-05-18: 750 mg via INTRAVENOUS
  Filled 2019-05-17: qty 150

## 2019-05-17 MED ORDER — MENTHOL 3 MG MT LOZG: 1.0000 | LOZENGE | OROMUCOSAL | Status: DC | PRN

## 2019-05-17 MED ORDER — LIDOCAINE 2% (20 MG/ML) 5 ML SYRINGE
INTRAMUSCULAR | Status: AC
  Filled 2019-05-17: qty 10

## 2019-05-17 MED ORDER — METHOCARBAMOL 1000 MG/10ML IJ SOLN
500.0000 mg | Freq: Four times a day (QID) | INTRAVENOUS | Status: DC | PRN
  Filled 2019-05-17: qty 5

## 2019-05-17 MED ORDER — LACTATED RINGERS IV SOLN
INTRAVENOUS | Status: DC
  Administered 2019-05-17: 07:00:00 via INTRAVENOUS

## 2019-05-17 MED ORDER — TAMSULOSIN HCL 0.4 MG PO CAPS
0.4000 mg | ORAL_CAPSULE | Freq: Every day | ORAL | Status: DC
  Administered 2019-05-17 – 2019-05-19 (×3): 0.4 mg via ORAL
  Filled 2019-05-17 (×3): qty 1

## 2019-05-17 MED ORDER — ROCURONIUM BROMIDE 10 MG/ML (PF) SYRINGE
PREFILLED_SYRINGE | INTRAVENOUS | Status: DC | PRN
  Administered 2019-05-17: 20 mg via INTRAVENOUS
  Administered 2019-05-17: 50 mg via INTRAVENOUS
  Administered 2019-05-17: 20 mg via INTRAVENOUS

## 2019-05-17 MED ORDER — SODIUM CHLORIDE 0.9 % IV SOLN: 250.0000 mL | INTRAVENOUS | Status: DC

## 2019-05-17 MED ORDER — HYDROXYZINE HCL 50 MG/ML IM SOLN
50.0000 mg | INTRAMUSCULAR | Status: DC | PRN
  Administered 2019-05-17: 50 mg via INTRAMUSCULAR
  Filled 2019-05-17: qty 1

## 2019-05-17 MED ORDER — LACTATED RINGERS IV SOLN
INTRAVENOUS | Status: DC | PRN
  Administered 2019-05-17: 08:00:00 via INTRAVENOUS

## 2019-05-17 MED ORDER — OXYCODONE-ACETAMINOPHEN 5-325 MG PO TABS
1.0000 | ORAL_TABLET | ORAL | Status: DC | PRN
  Administered 2019-05-17 – 2019-05-18 (×4): 2 via ORAL
  Administered 2019-05-18: 1 via ORAL
  Administered 2019-05-19 (×2): 2 via ORAL
  Filled 2019-05-17 (×6): qty 2
  Filled 2019-05-17: qty 1

## 2019-05-17 MED ORDER — DEXAMETHASONE SODIUM PHOSPHATE 10 MG/ML IJ SOLN
INTRAMUSCULAR | Status: DC | PRN
  Administered 2019-05-17: 10 mg via INTRAVENOUS

## 2019-05-17 MED ORDER — MORPHINE SULFATE (PF) 2 MG/ML IV SOLN
2.0000 mg | INTRAVENOUS | Status: DC | PRN
  Administered 2019-05-17: 2 mg via INTRAVENOUS
  Filled 2019-05-17: qty 1

## 2019-05-17 MED ORDER — ONDANSETRON HCL 4 MG/2ML IJ SOLN
INTRAMUSCULAR | Status: DC | PRN
  Administered 2019-05-17: 4 mg via INTRAVENOUS

## 2019-05-17 MED ORDER — PHENYLEPHRINE 40 MCG/ML (10ML) SYRINGE FOR IV PUSH (FOR BLOOD PRESSURE SUPPORT)
PREFILLED_SYRINGE | INTRAVENOUS | Status: AC
  Filled 2019-05-17: qty 10

## 2019-05-17 MED ORDER — ONDANSETRON HCL 4 MG PO TABS: 4.0000 mg | ORAL_TABLET | Freq: Four times a day (QID) | ORAL | Status: DC | PRN

## 2019-05-17 MED ORDER — THROMBIN 5000 UNITS EX SOLR
CUTANEOUS | Status: AC
  Filled 2019-05-17: qty 5000

## 2019-05-17 MED ORDER — POLYETHYLENE GLYCOL 3350 17 G PO PACK: 17.0000 g | PACK | Freq: Every day | ORAL | Status: DC | PRN

## 2019-05-17 MED ORDER — THROMBIN 20000 UNITS EX SOLR
CUTANEOUS | Status: DC | PRN
  Administered 2019-05-17: 20 mL via TOPICAL

## 2019-05-17 MED ORDER — ONDANSETRON HCL 4 MG/2ML IJ SOLN
INTRAMUSCULAR | Status: AC
  Filled 2019-05-17: qty 2

## 2019-05-17 MED ORDER — FENTANYL CITRATE (PF) 250 MCG/5ML IJ SOLN
INTRAMUSCULAR | Status: DC | PRN
  Administered 2019-05-17 (×5): 50 ug via INTRAVENOUS

## 2019-05-17 MED ORDER — DOCUSATE SODIUM 100 MG PO CAPS
100.0000 mg | ORAL_CAPSULE | Freq: Two times a day (BID) | ORAL | Status: DC
  Administered 2019-05-17 – 2019-05-19 (×5): 100 mg via ORAL
  Filled 2019-05-17 (×5): qty 1

## 2019-05-17 MED ORDER — SENNA 8.6 MG PO TABS
1.0000 | ORAL_TABLET | Freq: Two times a day (BID) | ORAL | Status: DC
  Administered 2019-05-17 – 2019-05-19 (×5): 8.6 mg via ORAL
  Filled 2019-05-17 (×5): qty 1

## 2019-05-17 MED ORDER — MIDAZOLAM HCL 2 MG/2ML IJ SOLN
INTRAMUSCULAR | Status: AC
  Filled 2019-05-17: qty 2

## 2019-05-17 MED ORDER — SODIUM CHLORIDE 0.9% FLUSH
3.0000 mL | Freq: Two times a day (BID) | INTRAVENOUS | Status: DC
  Administered 2019-05-17 – 2019-05-18 (×2): 3 mL via INTRAVENOUS

## 2019-05-17 MED ORDER — THROMBIN 20000 UNITS EX SOLR
CUTANEOUS | Status: AC
  Filled 2019-05-17: qty 20000

## 2019-05-17 MED ORDER — MIDAZOLAM HCL 2 MG/2ML IJ SOLN
INTRAMUSCULAR | Status: DC | PRN
  Administered 2019-05-17: 2 mg via INTRAVENOUS

## 2019-05-17 MED ORDER — PROPOFOL 10 MG/ML IV BOLUS
INTRAVENOUS | Status: AC
  Filled 2019-05-17: qty 20

## 2019-05-17 MED ORDER — PHENOL 1.4 % MT LIQD: 1.0000 | OROMUCOSAL | Status: DC | PRN

## 2019-05-17 MED ORDER — CHLORHEXIDINE GLUCONATE CLOTH 2 % EX PADS: 6.0000 | MEDICATED_PAD | Freq: Once | CUTANEOUS | Status: DC

## 2019-05-17 MED ORDER — KETAMINE HCL 50 MG/5ML IJ SOSY
PREFILLED_SYRINGE | INTRAMUSCULAR | Status: AC
  Filled 2019-05-17: qty 5

## 2019-05-17 MED ORDER — DEXAMETHASONE SODIUM PHOSPHATE 10 MG/ML IJ SOLN
INTRAMUSCULAR | Status: AC
  Filled 2019-05-17: qty 1

## 2019-05-17 MED ORDER — PROPOFOL 10 MG/ML IV BOLUS
INTRAVENOUS | Status: DC | PRN
  Administered 2019-05-17: 120 mg via INTRAVENOUS
  Administered 2019-05-17: 60 mg via INTRAVENOUS

## 2019-05-17 MED ORDER — SODIUM CHLORIDE 0.9 % IV SOLN
INTRAVENOUS | Status: DC | PRN
  Administered 2019-05-17: 500 mL

## 2019-05-17 MED ORDER — TRAMADOL HCL 50 MG PO TABS
50.0000 mg | ORAL_TABLET | Freq: Three times a day (TID) | ORAL | Status: DC
  Administered 2019-05-17 – 2019-05-18 (×3): 50 mg via ORAL
  Filled 2019-05-17 (×4): qty 1

## 2019-05-17 MED ORDER — ESMOLOL HCL 100 MG/10ML IV SOLN
INTRAVENOUS | Status: DC | PRN
  Administered 2019-05-17: 30 mg via INTRAVENOUS

## 2019-05-17 MED ORDER — BISACODYL 10 MG RE SUPP: 10.0000 mg | Freq: Every day | RECTAL | Status: DC | PRN

## 2019-05-17 MED ORDER — KETAMINE HCL 10 MG/ML IJ SOLN
INTRAMUSCULAR | Status: DC | PRN
  Administered 2019-05-17: 10 mg via INTRAVENOUS
  Administered 2019-05-17: 20 mg via INTRAVENOUS
  Administered 2019-05-17: 10 mg via INTRAVENOUS

## 2019-05-17 MED ORDER — ACETAMINOPHEN 325 MG PO TABS: 650.0000 mg | ORAL_TABLET | ORAL | Status: DC | PRN

## 2019-05-17 MED ORDER — LAMOTRIGINE 25 MG PO TABS
25.0000 mg | ORAL_TABLET | Freq: Every evening | ORAL | Status: DC
  Administered 2019-05-17 – 2019-05-18 (×2): 25 mg via ORAL
  Filled 2019-05-17 (×3): qty 1

## 2019-05-17 MED ORDER — SUGAMMADEX SODIUM 200 MG/2ML IV SOLN
INTRAVENOUS | Status: DC | PRN
  Administered 2019-05-17: 200 mg via INTRAVENOUS

## 2019-05-17 MED ORDER — SODIUM CHLORIDE 0.9% FLUSH
3.0000 mL | INTRAVENOUS | Status: DC | PRN
  Administered 2019-05-18: 3 mL via INTRAVENOUS
  Filled 2019-05-17: qty 3

## 2019-05-17 SURGICAL SUPPLY — 89 items
ADH SKN CLS APL DERMABOND .7 (GAUZE/BANDAGES/DRESSINGS) ×1
APPLIER CLIP 11 MED OPEN (CLIP) ×2
APR CLP MED 11 20 MLT OPN (CLIP) ×1
BAG DECANTER FOR FLEXI CONT (MISCELLANEOUS) ×2 IMPLANT
BASKET BONE COLLECTION (BASKET) IMPLANT
BONE CANC CHIPS 20CC PCAN1/4 (Bone Implant) ×2 IMPLANT
BUR BARREL STRAIGHT FLUTE 4.0 (BURR) ×1 IMPLANT
BUR MATCHSTICK NEURO 3.0 LAGG (BURR) ×1 IMPLANT
CANISTER SUCT 3000ML PPV (MISCELLANEOUS) ×2 IMPLANT
CHIPS CANC BONE 20CC PCAN1/4 (Bone Implant) ×1 IMPLANT
CLIP APPLIE 11 MED OPEN (CLIP) ×2 IMPLANT
COVER BACK TABLE 60X90IN (DRAPES) ×3 IMPLANT
COVER WAND RF STERILE (DRAPES) ×3 IMPLANT
DECANTER SPIKE VIAL GLASS SM (MISCELLANEOUS) ×2 IMPLANT
DERMABOND ADVANCED (GAUZE/BANDAGES/DRESSINGS) ×1
DERMABOND ADVANCED .7 DNX12 (GAUZE/BANDAGES/DRESSINGS) ×2 IMPLANT
DRAPE C-ARM 42X72 X-RAY (DRAPES) ×5 IMPLANT
DRAPE C-ARMOR (DRAPES) ×1 IMPLANT
DRAPE INCISE IOBAN 66X45 STRL (DRAPES) ×1 IMPLANT
DRAPE LAPAROTOMY 100X72X124 (DRAPES) ×2 IMPLANT
DURAPREP 26ML APPLICATOR (WOUND CARE) ×2 IMPLANT
ELECT BLADE 4.0 EZ CLEAN MEGAD (MISCELLANEOUS) ×2
ELECT REM PT RETURN 9FT ADLT (ELECTROSURGICAL) ×2
ELECTRODE BLDE 4.0 EZ CLN MEGD (MISCELLANEOUS) ×1 IMPLANT
ELECTRODE REM PT RTRN 9FT ADLT (ELECTROSURGICAL) ×1 IMPLANT
GAUZE 4X4 16PLY RFD (DISPOSABLE) IMPLANT
GLOVE BIOGEL PI IND STRL 7.5 (GLOVE) IMPLANT
GLOVE BIOGEL PI IND STRL 8 (GLOVE) ×1 IMPLANT
GLOVE BIOGEL PI IND STRL 8.5 (GLOVE) IMPLANT
GLOVE BIOGEL PI INDICATOR 7.5 (GLOVE)
GLOVE BIOGEL PI INDICATOR 8 (GLOVE) ×1
GLOVE BIOGEL PI INDICATOR 8.5 (GLOVE) ×1
GLOVE ECLIPSE 7.5 STRL STRAW (GLOVE) ×2 IMPLANT
GLOVE ECLIPSE 8.5 STRL (GLOVE) ×2 IMPLANT
GLOVE EXAM NITRILE XL STR (GLOVE) IMPLANT
GOWN STRL REUS W/ TWL LRG LVL3 (GOWN DISPOSABLE) ×3 IMPLANT
GOWN STRL REUS W/ TWL XL LVL3 (GOWN DISPOSABLE) ×1 IMPLANT
GOWN STRL REUS W/TWL 2XL LVL3 (GOWN DISPOSABLE) ×2 IMPLANT
GOWN STRL REUS W/TWL LRG LVL3 (GOWN DISPOSABLE) ×6
GOWN STRL REUS W/TWL XL LVL3 (GOWN DISPOSABLE) ×2
GRAFT BNE CANC CHIPS 1-8 20CC (Bone Implant) IMPLANT
HEMOSTAT POWDER KIT SURGIFOAM (HEMOSTASIS) ×1 IMPLANT
INSERT FOGARTY 61MM (MISCELLANEOUS) IMPLANT
INSERT FOGARTY SM (MISCELLANEOUS) IMPLANT
KIT BASIN OR (CUSTOM PROCEDURE TRAY) ×2 IMPLANT
KIT INFUSE X SMALL 1.4CC (Orthopedic Implant) ×1 IMPLANT
KIT TURNOVER KIT B (KITS) ×4 IMPLANT
LOOP VESSEL MAXI BLUE (MISCELLANEOUS) IMPLANT
LOOP VESSEL MINI RED (MISCELLANEOUS) IMPLANT
NDL HYPO 25X1 1.5 SAFETY (NEEDLE) IMPLANT
NDL SPNL 18GX3.5 QUINCKE PK (NEEDLE) IMPLANT
NEEDLE HYPO 25X1 1.5 SAFETY (NEEDLE) ×2 IMPLANT
NEEDLE SPNL 18GX3.5 QUINCKE PK (NEEDLE) ×2 IMPLANT
NS IRRIG 1000ML POUR BTL (IV SOLUTION) ×2 IMPLANT
PACK LAMINECTOMY NEURO (CUSTOM PROCEDURE TRAY) ×2 IMPLANT
PAD ARMBOARD 7.5X6 YLW CONV (MISCELLANEOUS) ×2 IMPLANT
SCREW LUM VA IND 5.5X30 (Screw) ×1 IMPLANT
SCREW VA LUM IND 5.5X25 (Screw) ×2 IMPLANT
SPACER HEDRON 26X34X13 15D (Spacer) ×1 IMPLANT
SPONGE INTESTINAL PEANUT (DISPOSABLE) ×5 IMPLANT
SPONGE LAP 18X18 RF (DISPOSABLE) ×2 IMPLANT
SPONGE LAP 4X18 RFD (DISPOSABLE) IMPLANT
SPONGE SURGIFOAM ABS GEL 100 (HEMOSTASIS) ×2 IMPLANT
STAPLER VISISTAT (STAPLE) IMPLANT
SUT PROLENE 4 0 RB 1 (SUTURE)
SUT PROLENE 4-0 RB1 .5 CRCL 36 (SUTURE) IMPLANT
SUT PROLENE 5 0 CC1 (SUTURE) IMPLANT
SUT PROLENE 6 0 C 1 30 (SUTURE) ×1 IMPLANT
SUT PROLENE 6 0 CC (SUTURE) IMPLANT
SUT SILK 0 TIES 10X30 (SUTURE) ×2 IMPLANT
SUT SILK 2 0 TIES 10X30 (SUTURE) ×4 IMPLANT
SUT SILK 2 0SH CR/8 30 (SUTURE) IMPLANT
SUT SILK 3 0 TIES 10X30 (SUTURE) ×2 IMPLANT
SUT SILK 3 0SH CR/8 30 (SUTURE) IMPLANT
SUT VIC AB 0 CT1 27 (SUTURE) ×2
SUT VIC AB 0 CT1 27XBRD ANBCTR (SUTURE) ×3 IMPLANT
SUT VIC AB 1 CT1 18XBRD ANBCTR (SUTURE) ×1 IMPLANT
SUT VIC AB 1 CT1 8-18 (SUTURE) ×2
SUT VIC AB 2-0 CP2 18 (SUTURE) ×1 IMPLANT
SUT VIC AB 2-0 CTB1 (SUTURE) ×2 IMPLANT
SUT VIC AB 3-0 SH 27 (SUTURE) ×2
SUT VIC AB 3-0 SH 27X BRD (SUTURE) ×1 IMPLANT
SUT VIC AB 3-0 SH 8-18 (SUTURE) ×2 IMPLANT
SUT VIC AB 4-0 RB1 18 (SUTURE) ×1 IMPLANT
SUT VICRYL 4-0 PS2 18IN ABS (SUTURE) IMPLANT
TOWEL GREEN STERILE (TOWEL DISPOSABLE) ×4 IMPLANT
TOWEL GREEN STERILE FF (TOWEL DISPOSABLE) ×6 IMPLANT
TRAY FOLEY MTR SLVR 16FR STAT (SET/KITS/TRAYS/PACK) ×2 IMPLANT
WATER STERILE IRR 1000ML POUR (IV SOLUTION) ×2 IMPLANT

## 2019-05-17 NOTE — Op Note (Signed)
    NAME: Kaitlyn Flowers    MRN: [id number] DOB: 17-Mar-1941    DATE OF OPERATION: 05/17/2019  PREOP DIAGNOSIS:    Degenerative disc disease L5-S1  POSTOP DIAGNOSIS:    Same  PROCEDURE:    Anterior retroperitoneal exposure of L5-S1  EXPOSURE SURGEON: Judeth Cornfield. Scot Dock, MD, FACS  SPINE SURGEON: Kristeen Miss, MD  ANESTHESIA: General  EBL: Minimal  INDICATIONS:    Kiamesha Holihan is a 78 y.o. female who presents for anterior lumbar interbody fusion.  We were asked to provide anterior retroperitoneal exposure  FINDINGS:   No significant calcific disease in the left common iliac artery and external iliac artery  TECHNIQUE:   The patient was taken to the operating room and received a general anesthetic.  The level of the L5-S1 disc was marked under fluoroscopy.  The abdomen was prepped and draped in usual sterile fashion.  A transverse incision was made to the left of the midline at the marked level.  The dissection was carried down to the anterior rectus sheath.  The anterior rectus sheath was divided transversely and this incision was extended slightly past the linea alba and slightly past the linea semilunaris.  The anterior rectus sheath was mobilized superiorly and inferiorly.  The retroperitoneal space was entered laterally and the dissection carried bluntly down to the psoas and into the iliac artery which was mobilized bluntly.  Dissection was further extended to the sacral promontory.  The middle sacral vessels were divided between electrocautery and clips.  This allowed mobilization of the left common iliac vein.  The Thompson retractor system was placed.  The blade on the right was placed.  We used a reverse lip retractor to maintain exposure.  A second retractor was placed to the left of the disc space.  Retractors were then placed superiorly and inferiorly allowing exposure of the L5-S1 disc.  This was confirmed under fluoroscopy.  The remainder of the dictation is as  per Dr. Ellene Route.  Deitra Mayo, MD, FACS [facility]  DATE OF DICTATION:   05/17/2019

## 2019-05-17 NOTE — Interval H&P Note (Signed)
History and Physical Interval Note:  05/17/2019 7:19 AM  Kaitlyn Flowers  has presented today for surgery, with the diagnosis of Right Foot Drop.  The various methods of treatment have been discussed with the patient and family. After consideration of risks, benefits and other options for treatment, the patient has consented to  Procedure(s) with comments: Lumbar 5 Sacral 1 Anterior lumbar interbody fusion (N/A) - Lumbar 5 Sacral 1 Anterior lumbar interbody fusion ABDOMINAL EXPOSURE (N/A) as a surgical intervention.  The patient's history has been reviewed, patient examined, no change in status, stable for surgery.  I have reviewed the patient's chart and labs.  Questions were answered to the patient's satisfaction.     Deitra Mayo

## 2019-05-17 NOTE — Anesthesia Procedure Notes (Signed)
Arterial Line Insertion Start/End9/14/2020 7:15 AM Performed by: Lavell Luster, CRNA, CRNA  Preanesthetic checklist: patient identified, IV checked, risks and benefits discussed, surgical consent and monitors and equipment checked Lidocaine 1% used for infiltration Left, radial was placed Catheter size: 20 G Hand hygiene performed  and maximum sterile barriers used  Allen's test indicative of satisfactory collateral circulation Attempts: 1 Procedure performed without using ultrasound guided technique. Following insertion, dressing applied and Biopatch. Post procedure assessment: normal  Patient tolerated the procedure well with no immediate complications.

## 2019-05-17 NOTE — Anesthesia Procedure Notes (Signed)
Procedure Name: Intubation Date/Time: 05/17/2019 7:45 AM Performed by: Elayne Snare, CRNA Pre-anesthesia Checklist: Patient identified, Emergency Drugs available, Suction available and Patient being monitored Patient Re-evaluated:Patient Re-evaluated prior to induction Oxygen Delivery Method: Circle System Utilized Preoxygenation: Pre-oxygenation with 100% oxygen Induction Type: IV induction Ventilation: Mask ventilation without difficulty Laryngoscope Size: Mac and 3 Grade View: Grade II Tube type: Oral Tube size: 7.0 mm Number of attempts: 1 Airway Equipment and Method: Stylet Placement Confirmation: ETT inserted through vocal cords under direct vision,  positive ETCO2 and breath sounds checked- equal and bilateral Secured at: 21 cm Tube secured with: Tape Dental Injury: Teeth and Oropharynx as per pre-operative assessment

## 2019-05-17 NOTE — Op Note (Signed)
Date of surgery: 05/17/2019 Preoperative diagnosis: Spondylolisthesis L5-S1 with lumbar radiculopathy, neurogenic claudication. Postoperative diagnosis: Same Procedure: Anterior lumbar decompression L5-S1 with placement of interbody spacer and arthrodesis with allograft and infuse.  Spacer is globus he Dron IA 26 x 34 size spacer with 13 mm anterior height and 15 degrees lordosis to 5.5 x 25 mm HA-coated screws inferiorly and one  5.5 x 30 mm screw superiorly.  Extra small infuse 20 cc of bone chip allograft. Surgeon: Kristeen Miss Approach: Dr. Doren Custard from vascular Anesthesia: General endotracheal Indications: Kaitlyn Flowers is a 78 year old individual whose had significant back and bilateral leg pain.  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  She has been advised regarding decompression using an anterior lumbar interbody technique.  Procedure: Patient was brought to the operating room placed on table in supine position after the smooth induction of general endotracheal anesthesia fluoroscopic visualization was used to mark the skin incision.  Skin was prepped with alcohol DuraPrep and draped in a sterile fashion and Dr. Doren Custard then performed an anterior retroperitoneal approach to L5-S1.  Once the anterior longitudinal ligament in front of L5-S1 was isolated and verified with radiographic exposure I then opened the anterior longitudinal ligament and resected a considerable piece of markedly degenerated desiccated disc material.  The endplates were then curettaged free of endplate cartilage and the region of the posterior longitudinal ligament was explored.  Interbody spacer was used to open up the interspace and this facilitated further dissection of degenerated disc material in the region of the posterior longitudinal ligament once this area was  cleared and dissected both left and to the right it was felt that both nerve roots were well decompressed and the ligament did not require any opening.  The interspace was then separated to an appropriate distance it is felt that a 13 mm tall spacer would fit best into this interval ultimately we sized the interspace for a 26 x 34 mm spacer with 13 mm anterior height and 15 degrees lordosis.  This was filled with a combination of cancellus bone chips mixed with a extra small mixture of infuse and then the spacer was placed into the interval and checked radiographically.  225 mm x 5.5 mm screws were placed into the sacrum under fluoroscopic visualization these were HA-coated screws and a singular 30 mm screw was placed into L5.  Final radiographs were obtained after the retractors were removed hemostasis in the soft tissues was obtained meticulously blood loss was quite minimal at about 50 cc for this entire procedure and then the anterior rectus sheath was closed with #1 Vicryl in interrupted and a running fashion 2-0 Vicryl was used in the subcutaneous tissues and three 3 and 4-0 Vicryl in a subcuticular skin patient tolerated procedure well was returned to recovery room in stable condition.

## 2019-05-17 NOTE — Progress Notes (Signed)
Pharmacy Antibiotic Note  Kaitlyn Flowers is a 78 y.o. female admitted on 05/17/2019 s/p  Anterior lumbar decompression L5-S1 with placement of interbody spacer and arthrodesis with allograft and infuse.  Pharmacy has been consulted for vancomycin dosing.  Plan: Give Vancomycin 750mg  IV x 1, 24 hours after preop dose due to renal function.  Pharmacy will sign off  Height: 5\' 1"  (154.9 cm) Weight: 106 lb 12.8 oz (48.4 kg) IBW/kg (Calculated) : 47.8  Temp (24hrs), Avg:98.2 F (36.8 C), Min:97.7 F (36.5 C), Max:98.5 F (36.9 C)  Recent Labs  Lab 05/13/19 1044  WBC 7.1  CREATININE 0.72    Estimated Creatinine Clearance: 44.4 mL/min (by C-G formula based on SCr of 0.72 mg/dL).    Allergies  Allergen Reactions  . Ibuprofen-Famotidine Other (See Comments)    Elevated BP  . Fentanyl Other (See Comments)    Constipates for about 2 weeks  . Pregabalin Other (See Comments)    AMS, Swelling  . Penicillins Rash    Did it involve swelling of the face/tongue/throat, SOB, or low BP? No Did it involve sudden or severe rash/hives, skin peeling, or any reaction on the inside of your mouth or nose? No Did you need to seek medical attention at a hospital or doctor's office? No When did it last happen?30+ years If all above answers are "NO", may proceed with cephalosporin use.     Norm Wray A. Levada Dy, PharmD, BCPS, FNKF Clinical Pharmacist [place] Please utilize Amion for appropriate phone number to reach the unit pharmacist ([place])

## 2019-05-17 NOTE — Transfer of Care (Signed)
Immediate Anesthesia Transfer of Care Note  Patient: Kaitlyn Flowers  Procedure(s) Performed: Lumbar five Sacral one  Anterior lumbar interbody fusion (N/A ) ABDOMINAL EXPOSURE (N/A )  Patient Location: PACU  Anesthesia Type:General  Level of Consciousness: awake and patient cooperative  Airway & Oxygen Therapy: Patient Spontanous Breathing  Post-op Assessment: Report given to RN and Post -op Vital signs reviewed and stable  Post vital signs: Reviewed and stable  Last Vitals:  Vitals Value Taken Time  BP 136/89 05/17/19 1031  Temp    Pulse 83 05/17/19 1032  Resp 17 05/17/19 1032  SpO2 100 % 05/17/19 1032  Vitals shown include unvalidated device data.  Last Pain:  Vitals:   05/17/19 0630  PainSc: 7       Patients Stated Pain Goal: 2 ([id number])  Complications: No apparent anesthesia complications

## 2019-05-17 NOTE — Anesthesia Postprocedure Evaluation (Signed)
Anesthesia Post Note  Patient: Andrian Bumgarner  Procedure(s) Performed: Lumbar five Sacral one  Anterior lumbar interbody fusion (N/A ) ABDOMINAL EXPOSURE (N/A )     Patient location during evaluation: PACU Anesthesia Type: General Level of consciousness: awake and alert Pain management: pain level controlled Vital Signs Assessment: post-procedure vital signs reviewed and stable Respiratory status: spontaneous breathing, nonlabored ventilation and respiratory function stable Cardiovascular status: blood pressure returned to baseline and stable Postop Assessment: no apparent nausea or vomiting Anesthetic complications: no    Last Vitals:  Vitals:   05/17/19 1130 05/17/19 1200  BP: 134/63 (!) 123/58  Pulse: 77 68  Resp: 12 20  Temp:  36.5 C  SpO2: 93% 95%    Last Pain:  Vitals:   05/17/19 1200  TempSrc: Oral  PainSc:                  Audry Pili

## 2019-05-17 NOTE — Evaluation (Addendum)
Physical Therapy Evaluation Patient Details Name: Kaitlyn Flowers MRN: [id number] DOB: November 24, 1940 Today's Date: 05/17/2019   History of Present Illness  78yo female woh received L5-S1 anterior lumbar fusion 05/17/19. PMH seizures, spinal cord tumor, HOH, RCR  Clinical Impression  Patient received in bed, very pleasant and willing to participate in session today. She requires MinA for functional bed mobility and VC for correct technique, totalA for application of lumbar brace in sitting at EOB; MinA for functional transfers, then able to gait train approximately 124f with RW and min guard, VC to maintain lumbar precautions when ambulating and interacting with environment. She was returned to supine with MinA, positioned to comfort, and left with all needs met and bed alarm active. She will continue to benefit from skilled PT services in the acute setting, recommend skilled HHPT services and S for OOB/mobility moving forward.     Follow Up Recommendations Home health PT;Supervision for mobility/OOB    Equipment Recommendations  Rolling walker with 5" wheels;3in1 (PT)    Recommendations for Other Services       Precautions / Restrictions Precautions Precautions: Back;Other (comment) Precaution Booklet Issued: No Required Braces or Orthoses: Spinal Brace Spinal Brace: Lumbar corset;Applied in sitting position Restrictions Weight Bearing Restrictions: No      Mobility  Bed Mobility Overal bed mobility: Needs Assistance Bed Mobility: Rolling;Sidelying to Sit;Sit to Sidelying Rolling: Min guard Sidelying to sit: Min assist     Sit to sidelying: Min assist General bed mobility comments: VC and MinA for correct mechanics with functional bed mobility  Transfers Overall transfer level: Needs assistance Equipment used: Rolling walker (2 wheeled) Transfers: Sit to/from Stand Sit to Stand: Min assist         General transfer comment: MinA to boost to full standing  position  Ambulation/Gait Ambulation/Gait assistance: Min guard Gait Distance (Feet): 120 Feet Assistive device: Rolling walker (2 wheeled) Gait Pattern/deviations: Step-through pattern;Decreased step length - right;Decreased step length - left;Decreased stride length;Trendelenburg;Drifts right/left Gait velocity: decreased   General Gait Details: cues to maintain lumbar precautions/prevent twisting spine when interacting with environment during gait, min guard for safety; multiple standing rest breaks  Stairs            Wheelchair Mobility    Modified Rankin (Stroke Patients Only)       Balance Overall balance assessment: Needs assistance Sitting-balance support: Bilateral upper extremity supported;Feet supported Sitting balance-Leahy Scale: Good     Standing balance support: Bilateral upper extremity supported;During functional activity Standing balance-Leahy Scale: Fair Standing balance comment: reliant on B UE support                             Pertinent Vitals/Pain Pain Assessment: Faces Faces Pain Scale: Hurts little more Pain Location: back/surgical site Pain Descriptors / Indicators: Aching;Discomfort Pain Intervention(s): Limited activity within patient's tolerance;Monitored during session;Repositioned    Home Living Family/patient expects to be discharged to:: Private residence Living Arrangements: Alone Available Help at Discharge: Friend(s);Available PRN/intermittently Type of Home: House(condo) Home Access: Level entry     Home Layout: One level Home Equipment: Walker - 4 wheels;Cane - single point      Prior Function Level of Independence: Independent with assistive device(s)               Hand Dominance        Extremity/Trunk Assessment   Upper Extremity Assessment Upper Extremity Assessment: Generalized weakness    Lower Extremity Assessment Lower Extremity Assessment:  Generalized weakness    Cervical / Trunk  Assessment Cervical / Trunk Assessment: Normal  Communication   Communication: No difficulties  Cognition Arousal/Alertness: Awake/alert Behavior During Therapy: WFL for tasks assessed/performed Overall Cognitive Status: Within Functional Limits for tasks assessed                                        General Comments      Exercises     Assessment/Plan    PT Assessment Patient needs continued PT services  PT Problem List Decreased strength;Decreased mobility;Decreased safety awareness;Decreased coordination;Decreased activity tolerance;Decreased knowledge of precautions;Decreased balance;Pain       PT Treatment Interventions DME instruction;Therapeutic activities;Gait training;Therapeutic exercise;Patient/family education;Stair training;Balance training;Functional mobility training;Neuromuscular re-education    PT Goals (Current goals can be found in the Care Plan section)  Acute Rehab PT Goals Patient Stated Goal: go home PT Goal Formulation: With patient Time For Goal Achievement: 05/31/19 Potential to Achieve Goals: Good    Frequency Min 5X/week   Barriers to discharge        Co-evaluation               AM-PAC PT "6 Clicks" Mobility  Outcome Measure Help needed turning from your back to your side while in a flat bed without using bedrails?: A Little Help needed moving from lying on your back to sitting on the side of a flat bed without using bedrails?: A Little Help needed moving to and from a bed to a chair (including a wheelchair)?: A Little Help needed standing up from a chair using your arms (e.g., wheelchair or bedside chair)?: A Little Help needed to walk in hospital room?: A Little Help needed climbing 3-5 steps with a railing? : A Little 6 Click Score: 18    End of Session Equipment Utilized During Treatment: Back brace Activity Tolerance: Patient tolerated treatment well Patient left: in bed;with call bell/phone within reach;with  bed alarm set;with family/visitor present Nurse Communication: Mobility status;Other (comment)(temperature in her room not adjusting) PT Visit Diagnosis: Unsteadiness on feet (R26.81);Muscle weakness (generalized) (M62.81);Difficulty in walking, not elsewhere classified (R26.2);Pain Pain - Right/Left: (back) Pain - part of body: (back)    Time: 1545-1610 PT Time Calculation (min) (ACUTE ONLY): 25 min   Charges:   PT Evaluation $PT Eval Low Complexity: 1 Low PT Treatments $Gait Training: 8-22 mins        Deniece Ree PT, DPT, CBIS  Supplemental Physical Therapist [place]    Pager [phone] Acute Rehab Office [phone]

## 2019-05-17 NOTE — Progress Notes (Signed)
Patient ID: Kaitlyn Flowers, female   DOB: 1941-07-13, 78 y.o.   MRN: [id number] Vital signs are stable Patient is awake and alert Motor function appears good She has some nausea and vomiting this afternoon We will continue to observe

## 2019-05-17 NOTE — H&P (Addendum)
Kaitlyn Flowers is an 78 y.o. female.   Chief Complaint: Back and bilateral leg pain, spondylolisthesis HPI: Kaitlyn Flowers is a 78 year old individual whose had significant back and bilateral lower extremity pain.  She has a history of a meningioma in the lower portion of the spinal cord that is been resected on 2 occasions.  She recently feels that her gait has been deteriorating her balance has been deteriorating and she has had substantial low back pain with bilateral leg pain.  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  After careful consideration of her options I advised an anterior lumbar interbody arthrodesis to decompress and stabilize the joint space.  Past Medical History:  Diagnosis Date  . Anemia   . Arthritis   . Back pain   . Cancer ([place])    skin nose  . Depression    situational,due to pain  . Leg weakness   . Osteopenia   . PONV (postoperative nausea and vomiting)   . Right leg pain   . Seizures ([place])    05/13/2019- 4 years ago, "couldnt process" couldnt speak happened 2 times; none since  . 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  . Urinary tract infection   . Wears hearing aid     Past Surgical History:  Procedure Laterality Date  . ABDOMINAL HYSTERECTOMY  1973   1/2 of ovary left cyst on ovary  . APPENDECTOMY     removed with hysterectomy  . COLONOSCOPY    . LUMBAR WOUND DEBRIDEMENT  12/06/2011   Procedure: LUMBAR WOUND DEBRIDEMENT;  Surgeon: Kristeen Miss, MD;  Location: [facility];  Service: Neurosurgery;  Laterality: N/A;  Repair of Pseudomeningocele Lumbar wound  . ROTATOR CUFF REPAIR     left   . shoulder rotator culff  Left 2006  . spinal cord tumor  1987   2012 october     Family History  Problem Relation Age of Onset  . Stroke Mother        2010  . Heart attack Father   . Anesthesia problems  Neg Hx    Social History:  reports that she quit smoking about 51 years ago. Her smoking use included cigarettes. She has a 0.50 pack-year smoking history. She has never used smokeless tobacco. She reports current alcohol use of about 4.0 standard drinks of alcohol per week. She reports that she does not use drugs.  Allergies:  Allergies  Allergen Reactions  . Ibuprofen-Famotidine Other (See Comments)    Elevated BP  . Fentanyl Other (See Comments)    Constipates for about 2 weeks  . Pregabalin Other (See Comments)    AMS, Swelling  . Penicillins Rash    Did it involve swelling of the face/tongue/throat, SOB, or low BP? No Did it involve sudden or severe rash/hives, skin peeling, or any reaction on the inside of your mouth or nose? No Did you need to seek medical attention at a hospital or doctor's office? No When did it last happen?30+ years If all above answers are "NO", may proceed with cephalosporin use.     Medications Prior to Admission  Medication Sig Dispense Refill  . Calcium Citrate-Vitamin D (CALCIUM + D PO) Take 2 tablets by mouth 2 (two) times daily.    . diclofenac sodium (VOLTAREN) 1 % GEL Apply 1 application topically 4 (four) times daily as needed (pain).    Marland Kitchen  Estradiol 10 MCG TABS vaginal tablet Place 1 tablet (10 mcg total) vaginally 2 (two) times a week. 8 tablet 11  . Garlic 123XX123 MG CAPS Take 1,000 mg by mouth daily.    Marland Kitchen lamoTRIgine (LAMICTAL) 25 MG tablet Take 1 tablet (25 mg total) by mouth daily. (Patient taking differently: Take 25 mg by mouth every evening. ) 90 tablet 3  . Meth-Hyo-M Bl-Na Phos-Ph Sal (URO-MP) 118 MG CAPS Take 118 mg by mouth daily as needed (UTI symptoms).   3  . silodosin (RAPAFLO) 8 MG CAPS capsule Take 8 mg by mouth every evening.   0  . traMADol (ULTRAM) 50 MG tablet Take 50 mg by mouth 3 (three) times daily.    . TURMERIC CURCUMIN PO Take 400 mg by mouth daily.    . Vitamin D-Vitamin K (VITAMIN K2-VITAMIN D3 PO) Take 1 capsule  by mouth daily.      No results found for this or any previous visit (from the past 48 hour(s)). No results found.  Review of Systems  Constitutional: Negative.   Eyes: Negative.   Gastrointestinal: Negative.   Genitourinary: Negative.   Skin: Negative.   Neurological: Positive for sensory change and weakness.  Endo/Heme/Allergies: Negative.   Psychiatric/Behavioral: Negative.     Blood pressure (!) 169/63, pulse 82, temperature 98.2 F (36.8 C), resp. rate 16, height 5\' 1"  (1.549 m), weight 48.4 kg, SpO2 95 %. Physical Exam  Constitutional: She is oriented to person, place, and time. She appears well-developed and well-nourished.  HENT:  Head: Normocephalic and atraumatic.  Eyes: Pupils are equal, round, and reactive to light. Conjunctivae and EOM are normal.  Neck: Normal range of motion. Neck supple.  GI: Soft. Bowel sounds are normal.  Musculoskeletal: Normal range of motion.  Neurological: She is alert and oriented to person, place, and time.  Mild weakness in both gastrocs 4 out of 5 mild weakness in tibialis anterior on the right 4 out of 5 absent patellar reflexes absent Achilles reflexes.  Gait is moderately wide-based.  Skin: Skin is warm and dry.  Psychiatric: She has a normal mood and affect. Her behavior is normal. Judgment and thought content normal.     Assessment/Plan Spondylolisthesis L5-S1 with subarticular stenosis and lumbar radiculopathy.  Plan: Anterior lumbar decompression and arthrodesis with vascular surgery to plan approach.  Earleen Newport, MD 05/17/2019, 7:41 AM

## 2019-05-18 ENCOUNTER — Encounter (HOSPITAL_COMMUNITY): Payer: Self-pay | Admitting: Neurological Surgery

## 2019-05-18 MED FILL — Heparin Sodium (Porcine) Inj 1000 Unit/ML: INTRAMUSCULAR | Qty: 30 | Status: AC

## 2019-05-18 MED FILL — Sodium Chloride IV Soln 0.9%: INTRAVENOUS | Qty: 1000 | Status: AC

## 2019-05-18 NOTE — Progress Notes (Signed)
Physical Therapy Treatment Patient Details Name: Kaitlyn Flowers MRN: [id number] DOB: 03/21/1941 Today's Date: 05/18/2019    History of Present Illness 78yo female woh received L5-S1 anterior lumbar fusion 05/17/19. PMH seizures, spinal cord tumor, HOH, RCR    PT Comments    Pt progressing towards physical therapy goals. Was able to perform transfers and ambulation with gross min guard assist for balance support and safety with RW. Pt with many questions regarding management at home. Recommended BSC use at bedside at nighttime and use over the toilet during the day as pt concerned about being able to get to the bathroom in the middle of the night. Continue to recommend [facility] therapy services follow-up at d/c. Pt is 5'1" and will require a youth sized walker upon d/c as well.   Follow Up Recommendations  Home health PT;Supervision for mobility/OOB     Equipment Recommendations  Rolling walker with 5" wheels (youth size);3in1 (PT)    Recommendations for Other Services       Precautions / Restrictions Precautions Precautions: Back Precaution Booklet Issued: No Required Braces or Orthoses: Spinal Brace Spinal Brace: Lumbar corset;Applied in sitting position Restrictions Weight Bearing Restrictions: No    Mobility  Bed Mobility Overal bed mobility: Needs Assistance Bed Mobility: Rolling;Sidelying to Sit Rolling: Min guard Sidelying to sit: Min assist       General bed mobility comments: Pt was received sitting up in recliner. Was encouraged to sit back in chair at end of session as she had just gotten up with OT a few minutes before session began.   Transfers Overall transfer level: Needs assistance Equipment used: Rolling walker (2 wheeled) Transfers: Sit to/from Stand Sit to Stand: Min guard         General transfer comment: Light min guard assist for balance support as pt powered-up to full standing position. VC's for wide BOS and improved posture for stand>sit.    Ambulation/Gait Ambulation/Gait assistance: Min guard Gait Distance (Feet): 220 Feet Assistive device: Rolling walker (2 wheeled) Gait Pattern/deviations: Step-through pattern;Decreased stride length;Drifts right/left Gait velocity: decreased Gait velocity interpretation: 1.31 - 2.62 ft/sec, indicative of limited community ambulator General Gait Details: VC's for improved posture and closer walker proximity especially during turns. Pt tolerated distance well without standing rest breaks this session.    Stairs             Wheelchair Mobility    Modified Rankin (Stroke Patients Only)       Balance Overall balance assessment: Needs assistance Sitting-balance support: No upper extremity supported;Feet supported Sitting balance-Leahy Scale: Fair     Standing balance support: Bilateral upper extremity supported;During functional activity Standing balance-Leahy Scale: Fair Standing balance comment: able to maintain balance for short period of time without BUE support during functional activity                            Cognition Arousal/Alertness: Awake/alert Behavior During Therapy: WFL for tasks assessed/performed Overall Cognitive Status: Within Functional Limits for tasks assessed                                        Exercises      General Comments        Pertinent Vitals/Pain Pain Assessment: Faces Faces Pain Scale: Hurts little more Pain Location: back/surgical site Pain Descriptors / Indicators: Aching;Discomfort;Grimacing Pain Intervention(s): Limited activity within patient's  tolerance;Monitored during session;Repositioned    Home Living Family/patient expects to be discharged to:: Private residence Living Arrangements: Alone Available Help at Discharge: Friend(s);Available PRN/intermittently Type of Home: Other(Comment)(Condo) Home Access: Level entry   Home Layout: One level Home Equipment: Walker - 4 wheels;Cane  - single point;Hand held shower head;Shower seat - built in      Prior Function Level of Independence: Independent with assistive device(s)      Comments: Requires rollator for balance during functional mobility   PT Goals (current goals can now be found in the care plan section) Acute Rehab PT Goals Patient Stated Goal: go home PT Goal Formulation: With patient Time For Goal Achievement: 05/31/19 Potential to Achieve Goals: Good Progress towards PT goals: Progressing toward goals    Frequency    Min 5X/week      PT Plan Current plan remains appropriate    Co-evaluation              AM-PAC PT "6 Clicks" Mobility   Outcome Measure  Help needed turning from your back to your side while in a flat bed without using bedrails?: A Little Help needed moving from lying on your back to sitting on the side of a flat bed without using bedrails?: A Little Help needed moving to and from a bed to a chair (including a wheelchair)?: A Little Help needed standing up from a chair using your arms (e.g., wheelchair or bedside chair)?: A Little Help needed to walk in hospital room?: A Little Help needed climbing 3-5 steps with a railing? : A Little 6 Click Score: 18    End of Session Equipment Utilized During Treatment: Back brace Activity Tolerance: Patient tolerated treatment well Patient left: in chair;with call bell/phone within reach Nurse Communication: Mobility status PT Visit Diagnosis: Unsteadiness on feet (R26.81);Muscle weakness (generalized) (M62.81);Difficulty in walking, not elsewhere classified (R26.2);Pain Pain - Right/Left: (back) Pain - part of body: (back)     Time: [id number] PT Time Calculation (min) (ACUTE ONLY): 26 min  Charges:  $Gait Training: 23-37 mins                     Kaitlyn Flowers, PT, DPT Acute Rehabilitation Services Pager: [phone] Office: [phone]    Thelma Comp 05/18/2019, 11:40 AM

## 2019-05-18 NOTE — Progress Notes (Signed)
Patient ID: Kaitlyn Flowers, female   DOB: 17-Aug-1941, 78 y.o.   MRN: [id number] Vital signs are stable She is doing quite well today noting decreased leg pain Abdomen is still somewhat sore Incisions clean and dry and functionally doing quite well plan discharge in the morning

## 2019-05-18 NOTE — TOC Initial Note (Signed)
Transition of Care [facility]) - Initial/Assessment Note    Patient Details  Name: Kaitlyn Flowers MRN: [id number] Date of Birth: [birth year]  Transition of Care [facility]) CM/SW Contact:    Sharin Mons, RN Phone Number: 05/18/2019, 12:55 PM  Clinical Narrative:           S/P  Anterior retroperitoneal exposure of L5-S1. From home alone. Pt states once d/c will receive 8 hrs of PCS ( nurse aide) per day from [place], private pay and assistance from girl friend.  PT recommendations: Home health PT;Supervision for mobility/OOB. Pt agreeable to home health PT services. Choice given and selected. Referral made with [facility] pending MD's order.  NCM will continue to monitor for TOC needs.......   Expected Discharge Plan: [place] Barriers to Discharge: Continued Medical Work up   Patient Goals and CMS Choice   CMS Medicare.gov Compare Post Acute Care list provided to:: Patient Choice offered to / list presented to : Patient  Expected Discharge Plan and Services Expected Discharge Plan: [place]         Expected Discharge Date: (Pending)                         HH Arranged: PT HH Agency: [place] [facility] provide PCS/ 8hrs a day) Date HH Agency Contacted: 05/17/19 Time HH Agency Contacted: 28 Representative spoke with at [place]: [facility] Arrangements/Services     Patient language and need for interpreter reviewed:: Yes Do you feel safe going back to the place where you live?: Yes      Need for Family Participation in Patient Care: Yes (Comment) Care giver support system in place?: Yes (comment)   Criminal Activity/Legal Involvement Pertinent to Current Situation/Hospitalization: No - Comment as needed  Activities of Daily Living Home Assistive Devices/Equipment: Eyeglasses, Cane (specify quad or straight), Walker (specify type), Grab bars in shower(straight cane,  rollator) ADL Screening (condition at time of admission) Patient's cognitive ability adequate to safely complete daily activities?: Yes Is the patient deaf or have difficulty hearing?: No Does the patient have difficulty seeing, even when wearing glasses/contacts?: No Does the patient have difficulty concentrating, remembering, or making decisions?: No Patient able to express need for assistance with ADLs?: Yes Does the patient have difficulty dressing or bathing?: Yes Independently performs ADLs?: No Communication: Independent Dressing (OT): Needs assistance Is this a change from baseline?: Change from baseline, expected to last <3days Grooming: Independent Feeding: Independent Bathing: Needs assistance Is this a change from baseline?: Change from baseline, expected to last <3 days Toileting: Needs assistance Is this a change from baseline?: Change from baseline, expected to last <3 days In/Out Bed: Needs assistance Is this a change from baseline?: Change from baseline, expected to last <3 days Does the patient have difficulty walking or climbing stairs?: Yes Weakness of Legs: Right Weakness of Arms/Hands: Right  Permission Sought/Granted Permission sought to share information with : Case Manager                Emotional Assessment Appearance:: Appears stated age Attitude/Demeanor/Rapport: Gracious Affect (typically observed): Accepting Orientation: : Oriented to Self, Oriented to Place, Oriented to  Time, Oriented to Situation Alcohol / Substance Use: Not Applicable Psych Involvement: No (comment)  Admission diagnosis:  Right Foot Drop Patient Active Problem List   Diagnosis Date Noted  . Spondylolisthesis at L5-S1 level 05/17/2019  . Tension-type headache,  not intractable 06/11/2017  . Right leg weakness 06/11/2017  . Labile hypertension 09/27/2016  . Autonomic dysfunction 09/27/2016  . Chronic pain syndrome 09/10/2016  . Localization-related idiopathic epilepsy  and epileptic syndromes with seizures of localized onset, not intractable, without status epilepticus ([place]) 08/01/2016  . Hyponatremia 04/01/2016  . Aphasia   . Abdominal cramping 01/22/2016  . Nocturnal leg cramps 12/07/2015  . UTI (lower urinary tract infection) 10/02/2015  . 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 Sacroiliitis ([place]) 06/06/2014  . Lumbar radicular pain 06/06/2014  . Right low back pain 01/07/2014  . Anemia 01/07/2014  . Osteoporosis 12/26/2013  . Glucosuria 12/16/2012  . Tick bite of upper arm 12/16/2012  . UTI (urinary tract infection) 12/16/2012  . Back pain with radiation 06/12/2012  . Plantar wart of right foot 12/29/2011  . 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/11/2011  . Fatigue 11/11/2011  . 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 Urinary frequency 09/02/2011  . Dysuria 09/02/2011  . Medicare annual wellness visit, initial 12/18/2010  . Wears hearing aid 12/18/2010  . Medicare welcome visit 12/18/2010  . Back pain 11/23/2010  . Spinal cord tumor   . ABDOMINAL BRUIT 07/21/2009  . NEOPLASM, SKIN, UNCERTAIN BEHAVIOR [id number]  . HYPERKALEMIA 05/12/2007  . ANEMIA NOS 05/12/2007  . DISORDER, STOMACH FUNCTION NEC 04/07/2007   PCP:  Burnis Medin, MD Pharmacy:   [facility] [place], [place] - [address] [place] [address] Phone: [phone] Fax: [phone]  [id number] - [place], [place] [id number] [place] [id number] Phone: [phone] Fax: [phone]     Social Determinants of Health (SDOH) Interventions    Readmission Risk Interventions No flowsheet data found.

## 2019-05-18 NOTE — Progress Notes (Signed)
   VASCULAR SURGERY ASSESSMENT & PLAN:   1 Day Post-Op s/p: Anterior retroperitoneal exposure of L5-S1  Doing well.  Vascular surgery will be available as needed.   SUBJECTIVE:   Pain well controlled.  PHYSICAL EXAM:   Vitals:   05/17/19 2214 05/17/19 2331 05/18/19 0322 05/18/19 0810  BP: (!) 90/48 (!) 87/42 (!) 96/49 (!) 106/41  Pulse: 87 84 89 93  Resp:  16 16 16   Temp:  98.2 F (36.8 C) 98 F (36.7 C) 98 F (36.7 C)  TempSrc:  Oral Oral Oral  SpO2:  95% 98% 92%  Weight:      Height:       Palpable pedal pulses left foot  LABS:   Lab Results  Component Value Date   WBC 7.1 05/13/2019   HGB 11.4 (L) 05/13/2019   HCT 34.6 (L) 05/13/2019   MCV 101.8 (H) 05/13/2019   PLT 242 05/13/2019   Lab Results  Component Value Date   CREATININE 0.72 05/13/2019   Lab Results  Component Value Date   INR 1.01 03/25/2016   CBG (last 3)  No results for input(s): GLUCAP in the last 72 hours.  PROBLEM LIST:    Active Problems:   Spondylolisthesis at L5-S1 level   CURRENT MEDS:   . docusate sodium  100 mg Oral BID  . lamoTRIgine  25 mg Oral QPM  . senna  1 tablet Oral BID  . sodium chloride flush  3 mL Intravenous Q12H  . tamsulosin  0.4 mg Oral Daily  . traMADol  50 mg Oral TID    Deitra Mayo Beeper: [id number] Office: [phone] 05/18/2019

## 2019-05-18 NOTE — Progress Notes (Signed)
Occupational Therapy Evaluation Patient Details Name: Kaitlyn Flowers MRN: [id number] DOB: 07-20-1941 Today's Date: 05/18/2019    History of Present Illness 78yo female woh received L5-S1 anterior lumbar fusion 05/17/19. PMH seizures, spinal cord tumor, HOH, RCR   Clinical Impression   PTA, pt lived alone in one level condo and was independent with assistive devices for ADLs. Pt currently presents with decreased strength, balance, and increased pain. Provided pt with education regarding back precautions, use of AE, LB dressing, and toileting. Pt performed LB dressing using AE with min guard A and mod VCs for safety, sequencing, and use of AE. Pt performed toileting with min guard A and mod VCs for safety and sequencing. Pt performed hand hygiene and oral care with supervision for safety. Recommend dc home with HHOT to address compensatory strategies for ADLs. Will follow acutely as admitted.     Follow Up Recommendations  Home health OT    Equipment Recommendations  3 in 1 bedside commode    Recommendations for Other Services PT consult     Precautions / Restrictions Precautions Precautions: Back Precaution Booklet Issued: No Precaution Comments: Provided education on back precautions. able to verbalize no bending, lifting, or twisting Required Braces or Orthoses: Spinal Brace Spinal Brace: Lumbar corset;Applied in sitting position Restrictions Weight Bearing Restrictions: No      Mobility Bed Mobility Overal bed mobility: Needs Assistance Bed Mobility: Rolling;Sidelying to Sit Rolling: Min guard Sidelying to sit: Min assist         Transfers Overall transfer level: Needs assistance Equipment used: Rolling walker (2 wheeled) Transfers: Sit to/from Stand Sit to Stand: Min guard             Balance Overall balance assessment: Needs assistance Sitting-balance support: No upper extremity supported;Feet supported Sitting balance-Leahy Scale: Fair     Standing  balance support: Bilateral upper extremity supported;During functional activity Standing balance-Leahy Scale: Fair Standing balance comment: able to maintain balance for short period of time without BUE support during functional activity                           ADL either performed or assessed with clinical judgement   ADL Overall ADL's : Needs assistance/impaired Eating/Feeding: Modified independent(increased time)   Grooming: Oral care;Wash/dry hands;Supervision/safety;Standing Grooming Details (indicate cue type and reason): Pt performed hand hygiene and oral care with supervision for safety and balance Upper Body Bathing: Supervision/ safety;Sitting   Lower Body Bathing: Sit to/from stand;Min guard   Upper Body Dressing : Supervision/safety;Sitting;Cueing for safety   Lower Body Dressing: Sit to/from stand;With adaptive equipment;Min guard;Cueing for sequencing;Cueing for compensatory techniques Lower Body Dressing Details (indicate cue type and reason): Pt educated on use of reacher for LB dressing. Pt demonstrated donning underwear with reacher with min guard A and mod VCs for positioning of AE and maintaining back precautions Toilet Transfer: Min guard;Cueing for safety;Ambulation;Regular Toilet;Grab bars Toilet Transfer Details (indicate cue type and reason): Pt demonstrated toilet transfer with min guard A and VCs for safety and balance Toileting- Clothing Manipulation and Hygiene: Min guard;Cueing for safety;Sit to/from stand Toileting - Clothing Manipulation Details (indicate cue type and reason): Pt demonstrated toilet hygiene with min guard A and VS for safety and balance     Functional mobility during ADLs: Supervision/safety;Rolling walker General ADL Comments: Pt required min guard A/supervision for safety and balance with ADLs     Vision Baseline Vision/History: Wears glasses Wears Glasses: At all times Patient Visual Report: No  change from  baseline Vision Assessment?: No apparent visual deficits     Perception     Praxis      Pertinent Vitals/Pain Pain Assessment: Faces Faces Pain Scale: Hurts little more Pain Location: back/surgical site Pain Descriptors / Indicators: Aching;Discomfort;Grimacing Pain Intervention(s): Limited activity within patient's tolerance;Monitored during session;Repositioned     Hand Dominance Right   Extremity/Trunk Assessment Upper Extremity Assessment Upper Extremity Assessment: Generalized weakness   Lower Extremity Assessment Lower Extremity Assessment: Defer to PT evaluation   Cervical / Trunk Assessment Cervical / Trunk Assessment: Normal   Communication Communication Communication: No difficulties   Cognition Arousal/Alertness: Awake/alert Behavior During Therapy: WFL for tasks assessed/performed Overall Cognitive Status: Within Functional Limits for tasks assessed                                     General Comments       Exercises     Shoulder Instructions      Home Living Family/patient expects to be discharged to:: Private residence Living Arrangements: Alone Available Help at Discharge: Friend(s);Available PRN/intermittently Type of Home: Other(Comment)(Condo) Home Access: Level entry     Home Layout: One level     Bathroom Shower/Tub: Occupational psychologist: Standard     Home Equipment: Environmental consultant - 4 wheels;Cane - single point;Hand held shower head;Shower seat - built in   Additional Comments: Pt planning to have personal care aide at dc      Prior Functioning/Environment Level of Independence: Independent with assistive device(s)        Comments: Requires rollator for balance during functional mobility        OT Problem List: Decreased strength;Decreased range of motion;Decreased activity tolerance;Impaired balance (sitting and/or standing);Decreased knowledge of use of DME or AE;Decreased knowledge of  precautions;Pain      OT Treatment/Interventions: Self-care/ADL training;DME and/or AE instruction;Patient/family education    OT Goals(Current goals can be found in the care plan section) Acute Rehab OT Goals Patient Stated Goal: go home OT Goal Formulation: With patient Time For Goal Achievement: 06/01/19 Potential to Achieve Goals: Good  OT Frequency: Min 3X/week   Barriers to D/C:            Co-evaluation              AM-PAC OT "6 Clicks" Daily Activity     Outcome Measure Help from another person eating meals?: None Help from another person taking care of personal grooming?: None Help from another person toileting, which includes using toliet, bedpan, or urinal?: A Little Help from another person bathing (including washing, rinsing, drying)?: A Little Help from another person to put on and taking off regular upper body clothing?: A Little Help from another person to put on and taking off regular lower body clothing?: A Little 6 Click Score: 20   End of Session Equipment Utilized During Treatment: Back brace Nurse Communication: Mobility status  Activity Tolerance: Patient tolerated treatment well Patient left: in chair;Other (comment)(with PT present to begin session)  OT Visit Diagnosis: Unsteadiness on feet (R26.81);Muscle weakness (generalized) (M62.81);Pain Pain - part of body: (back)                Time: [id number] OT Time Calculation (min): 34 min Charges:  OT General Charges $OT Visit: 1 Visit OT Evaluation $OT Eval Low Complexity: 1 Low OT Treatments $Self Care/Home Management : 8-22 mins  Gus Rankin, OT Student  Gus Rankin 05/18/2019, 12:01 PM

## 2019-05-19 MED ORDER — DIAZEPAM 5 MG PO TABS: 5.0000 mg | ORAL_TABLET | Freq: Four times a day (QID) | ORAL | 0 refills | Status: DC | PRN

## 2019-05-19 MED ORDER — OXYCODONE-ACETAMINOPHEN 5-325 MG PO TABS: 1.0000 | ORAL_TABLET | Freq: Four times a day (QID) | ORAL | 0 refills | Status: DC | PRN

## 2019-05-19 NOTE — Progress Notes (Signed)
Physical Therapy Treatment Patient Details Name: Kaitlyn Flowers MRN: [id number] DOB: 1941-02-20 Today's Date: 05/19/2019    History of Present Illness Pt is a 78 y/o female woh received L5-S1 anterior lumbar fusion 05/17/19. PMH seizures, spinal cord tumor, HOH, RCR    PT Comments    Pt progressing towards physical therapy goals. Pt reports feeling somewhat SOB with hallway ambulation however attributed this to her thick mask. Focus of session was stair training and bed mobility training. Pt states her main goal is to be able to get herself to the [facility] in the middle of the night without assistance. Pt is able to complete with supervision for safety however will need assist to elevate LE's back up in bed to return to supine. Will continue to follow and progress as able per POC.     Follow Up Recommendations  Home health PT;Supervision for mobility/OOB     Equipment Recommendations  Rolling walker with 5" wheels;3in1 (PT)    Recommendations for Other Services       Precautions / Restrictions Precautions Precautions: Back Precaution Booklet Issued: Yes (comment) Precaution Comments: recalled 3/3 precautions Required Braces or Orthoses: Spinal Brace Spinal Brace: Lumbar corset;Applied in sitting position Restrictions Weight Bearing Restrictions: No    Mobility  Bed Mobility Overal bed mobility: Needs Assistance Bed Mobility: Rolling;Sidelying to Sit;Sit to Sidelying Rolling: Modified independent (Device/Increase time) Sidelying to sit: Supervision     Sit to sidelying: Min assist General bed mobility comments: Multiple rep practice to transition to/from EOB. Pt was able to get to EOB with supervision but required very light min assist to elevate LE's back up into bed due to abdominal pain (from incision site).   Transfers Overall transfer level: Needs assistance Equipment used: Rolling walker (2 wheeled) Transfers: Sit to/from Stand Sit to Stand: Min guard          General transfer comment: Close guard for balance to power-up to full stand. When standing from the EOB, pt had difficulty without hands on walker. Instructed in pushing up from thighs or bed rail (will have at home) if she cannot stand from walker without pulling it backwards.  Ambulation/Gait Ambulation/Gait assistance: Min guard;Supervision Gait Distance (Feet): 200 Feet Assistive device: Rolling walker (2 wheeled) Gait Pattern/deviations: Step-through pattern;Decreased stride length;Drifts right/left;Trunk flexed Gait velocity: decreased Gait velocity interpretation: 1.31 - 2.62 ft/sec, indicative of limited community ambulator General Gait Details: VC's for improved posture and closer walker proximity. Pt reports some SOB due to mask usage. Distance limited this session so we could focus on stair training.    Stairs Stairs: Yes Stairs assistance: Min assist Stair Management: One rail Left;Step to pattern;Forwards Number of Stairs: 2 General stair comments: HHA on R. Pt practiced 1 step x2 to simulate home environment. Pt was able to power-up with push through R hand. Educated on how assistance should be provided at home to give pt the optimal leverage.   Wheelchair Mobility    Modified Rankin (Stroke Patients Only)       Balance Overall balance assessment: Needs assistance Sitting-balance support: No upper extremity supported;Feet supported Sitting balance-Leahy Scale: Fair     Standing balance support: Bilateral upper extremity supported;During functional activity Standing balance-Leahy Scale: Fair Standing balance comment: able to maintain balance for short period of time without BUE support during functional activity                            Cognition Arousal/Alertness: Awake/alert Behavior During  Therapy: WFL for tasks assessed/performed Overall Cognitive Status: Within Functional Limits for tasks assessed                                         Exercises      General Comments        Pertinent Vitals/Pain Pain Assessment: Faces Faces Pain Scale: Hurts a little bit Pain Location: back/surgical site Pain Descriptors / Indicators: Aching;Discomfort;Grimacing Pain Intervention(s): Monitored during session    Home Living                      Prior Function            PT Goals (current goals can now be found in the care plan section) Acute Rehab PT Goals Patient Stated Goal: go home PT Goal Formulation: With patient Time For Goal Achievement: 05/31/19 Potential to Achieve Goals: Good Progress towards PT goals: Progressing toward goals    Frequency    Min 5X/week      PT Plan Current plan remains appropriate    Co-evaluation              AM-PAC PT "6 Clicks" Mobility   Outcome Measure  Help needed turning from your back to your side while in a flat bed without using bedrails?: A Little Help needed moving from lying on your back to sitting on the side of a flat bed without using bedrails?: A Little Help needed moving to and from a bed to a chair (including a wheelchair)?: A Little Help needed standing up from a chair using your arms (e.g., wheelchair or bedside chair)?: A Little Help needed to walk in hospital room?: A Little Help needed climbing 3-5 steps with a railing? : A Little 6 Click Score: 18    End of Session Equipment Utilized During Treatment: Back brace Activity Tolerance: Patient tolerated treatment well Patient left: in chair;with call bell/phone within reach Nurse Communication: Mobility status PT Visit Diagnosis: Unsteadiness on feet (R26.81);Muscle weakness (generalized) (M62.81);Difficulty in walking, not elsewhere classified (R26.2);Pain Pain - Right/Left: (back) Pain - part of body: (back)     Time: [id number] PT Time Calculation (min) (ACUTE ONLY): 24 min  Charges:  $Gait Training: 23-37 mins                     Rolinda Roan, PT, DPT Acute  Rehabilitation Services Pager: [phone] Office: [phone]    Thelma Comp 05/19/2019, 12:13 PM

## 2019-05-19 NOTE — Progress Notes (Signed)
Occupational Therapy Treatment Patient Details Name: Kaitlyn Flowers MRN: [id number] DOB: [birth year] Today's Date: 05/19/2019    History of present illness 78yo female who received L5-S1 anterior lumbar fusion 05/17/19. PMH seizures, spinal cord tumor, HOH, RCR   OT comments  Pt continues to make progress towards OT goals. Pt presents with decreased balance, decreased strength, and pain impacting her ability to complete ADLs. Pt recalled 3/3 back precautions. Continued education on compensatory techniques and using AE for LB dressing. Pt performed LB dressing with use of AE with supervision for safety. Pt performed UB dressing with supervision for safety. Pt completing toileting and hand hygiene with supervision- min guard A for safety and balance. Recommend dc home with HHOT. Will continue to follow acutely as admitted.    Follow Up Recommendations  Home health OT    Equipment Recommendations  3 in 1 bedside commode    Recommendations for Other Services PT consult    Precautions / Restrictions Precautions Precautions: Back Precaution Booklet Issued: Yes (comment) Precaution Comments: recalled 3/3 precautions Required Braces or Orthoses: Spinal Brace Spinal Brace: Lumbar corset;Applied in sitting position Restrictions Weight Bearing Restrictions: No       Mobility Bed Mobility Overal bed mobility: Needs Assistance Bed Mobility: Rolling;Sit to Sidelying Rolling: Min guard       Sit to sidelying: Min assist(required min A to lift feet onto bed) General bed mobility comments: Pt received in recliner. Pt requested to go back to bed, required min guard A-min A for bed mobility  Transfers Overall transfer level: Needs assistance Equipment used: Rolling walker (2 wheeled) Transfers: Sit to/from Stand Sit to Stand: Min guard         General transfer comment: pt required min guard A for balance and safety    Balance Overall balance assessment: Needs  assistance Sitting-balance support: No upper extremity supported;Feet supported Sitting balance-Leahy Scale: Fair     Standing balance support: Bilateral upper extremity supported;During functional activity Standing balance-Leahy Scale: Fair Standing balance comment: able to maintain balance for short period of time without BUE support during functional activity                           ADL either performed or assessed with clinical judgement   ADL Overall ADL's : Needs assistance/impaired     Grooming: Wash/dry hands;Oral care;Supervision/safety;Standing Grooming Details (indicate cue type and reason): Pt performed hand hygiene and oral care with supervision for safety and balance         Upper Body Dressing : Supervision/safety;Sitting Upper Body Dressing Details (indicate cue type and reason): Pt donned shirt and jacket while maintaining back precautions with supervision for safety Lower Body Dressing: Supervision/safety;With adaptive equipment;Sit to/from stand Lower Body Dressing Details (indicate cue type and reason): Pt recalled use of reacher for donning LB clothing. Pt donned LB clothing with AE with supervision. Provided education on using sock aid to don socks, pt return demonstrated sock aid for donning socks with supervision for safety. Toilet Transfer: Min guard;Ambulation;Regular Toilet;RW Toilet Transfer Details (indicate cue type and reason): Pt demonstrated toilet transfer with min guard A and VCs for safety and balance Toileting- Clothing Manipulation and Hygiene: Supervision/safety;Sit to/from stand       Functional mobility during ADLs: Rolling walker;Supervision/safety General ADL Comments: Pt performed most ADLs with supervision for safety     Vision   Vision Assessment?: No apparent visual deficits   Perception     Praxis  Cognition Arousal/Alertness: Awake/alert Behavior During Therapy: WFL for tasks assessed/performed Overall  Cognitive Status: Within Functional Limits for tasks assessed                                          Exercises     Shoulder Instructions       General Comments      Pertinent Vitals/ Pain       Pain Assessment: Faces Faces Pain Scale: Hurts a little bit Pain Location: back/surgical site Pain Descriptors / Indicators: Aching;Discomfort;Grimacing Pain Intervention(s): Monitored during session;Repositioned  Home Living                                          Prior Functioning/Environment              Frequency  Min 3X/week        Progress Toward Goals  OT Goals(current goals can now be found in the care plan section)  Progress towards OT goals: Progressing toward goals  Acute Rehab OT Goals Patient Stated Goal: go home OT Goal Formulation: With patient Time For Goal Achievement: 06/01/19 Potential to Achieve Goals: Good ADL Goals Pt Will Perform Grooming: with modified independence;standing Pt Will Perform Upper Body Dressing: with modified independence;sitting Pt Will Perform Lower Body Dressing: with modified independence;with adaptive equipment;sit to/from stand Pt Will Transfer to Toilet: with modified independence;ambulating;bedside commode Pt Will Perform Toileting - Clothing Manipulation and hygiene: with modified independence;sit to/from stand Additional ADL Goal #1: Pt will verbalize 3/3 back precautions with no more than 1 verbal cue. Additional ADL Goal #2: Pt will complete bed mobility while maintaining back precautions with mod I.  Plan Discharge plan remains appropriate    Co-evaluation                 AM-PAC OT "6 Clicks" Daily Activity     Outcome Measure   Help from another person eating meals?: None Help from another person taking care of personal grooming?: None Help from another person toileting, which includes using toliet, bedpan, or urinal?: A Little Help from another person bathing  (including washing, rinsing, drying)?: A Little Help from another person to put on and taking off regular upper body clothing?: None Help from another person to put on and taking off regular lower body clothing?: A Little 6 Click Score: 21    End of Session Equipment Utilized During Treatment: Back brace;Rolling walker  OT Visit Diagnosis: Unsteadiness on feet (R26.81);Muscle weakness (generalized) (M62.81);Pain Pain - part of body: (back)   Activity Tolerance Patient tolerated treatment well   Patient Left in bed;with call bell/phone within reach(With MD present in room)   Nurse Communication Mobility status        Time: [id number] OT Time Calculation (min): 24 min  Charges: OT General Charges $OT Visit: 1 Visit OT Treatments $Self Care/Home Management : 23-37 mins  Gus Rankin, OT Student  Gus Rankin 05/19/2019, 10:22 AM

## 2019-05-19 NOTE — Progress Notes (Signed)
Pt given D/C instructions with verbal understanding. Rx's were sent to pharmacy by MD. Pt's incision is clean and dry with no sign of infection. Pt's IV was removed prior to D/C. Pt's Home Health was arranged by CM per MD order. Pt received RW and 3-n-1 per MD order. Pt D/C'd home via wheelchair per MD order. Pt is stable @ D/C and has no other needs at this time. Holli Humbles, RN

## 2019-05-19 NOTE — Discharge Summary (Signed)
Physician Discharge Summary  Patient ID: Kaitlyn Flowers MRN: [id number] DOB/AGE: [birth year] 78 y.o.  Admit date: 05/17/2019 Discharge date: 05/19/2019  Admission Diagnoses: Lumbar spondylolisthesis L5-S1 with lumbar radiculopathy  Discharge Diagnoses: Lumbar spondylolisthesis L5-S1 with lumbar radiculopathy Active Problems:   Spondylolisthesis at L5-S1 level   Discharged Condition: good  Hospital Course: Patient was admitted to undergo surgical decompression arthrodesis at L5-S1 via an anterior approach she tolerated surgery well  Consults: None  Significant Diagnostic Studies: None  Treatments: surgery: Anterior lumbar interbody arthrodesis L5-S1 with decompression L5-S1  Discharge Exam: Blood pressure (!) 107/46, pulse (!) 104, temperature 98.8 F (37.1 C), temperature source Oral, resp. rate 16, height 5\' 1"  (1.549 m), weight 48.4 kg, SpO2 100 %. Incision is clean and dry Station and gait are intact  Disposition: Discharge disposition: 01-Home or Self Care       Discharge Instructions    Call MD for:  redness, tenderness, or signs of infection (pain, swelling, redness, odor or green/yellow discharge around incision site)   Complete by: As directed    Call MD for:  severe uncontrolled pain   Complete by: As directed    Call MD for:  temperature >100.4   Complete by: As directed    Diet - low sodium heart healthy   Complete by: As directed    Discharge instructions   Complete by: As directed    Okay to shower. Do not apply salves or appointments to incision. No heavy lifting with the upper extremities greater than 15 pounds. May resume driving when not requiring pain medication and patient feels comfortable with doing so.   Incentive spirometry RT   Complete by: As directed    Increase activity slowly   Complete by: As directed      Allergies as of 05/19/2019      Reactions   Ibuprofen-famotidine Other (See Comments)   Elevated BP   Fentanyl Other (See  Comments)   Constipates for about 2 weeks   Pregabalin Other (See Comments)   AMS, Swelling   Penicillins Rash   Did it involve swelling of the face/tongue/throat, SOB, or low BP? No Did it involve sudden or severe rash/hives, skin peeling, or any reaction on the inside of your mouth or nose? No Did you need to seek medical attention at a hospital or doctor's office? No When did it last happen?30+ years If all above answers are "NO", may proceed with cephalosporin use.      Medication List    TAKE these medications   CALCIUM + D PO Take 2 tablets by mouth 2 (two) times daily.   diazepam 5 MG tablet Commonly known as: Valium Take 1 tablet (5 mg total) by mouth every 6 (six) hours as needed for muscle spasms.   diclofenac sodium 1 % Gel Commonly known as: VOLTAREN Apply 1 application topically 4 (four) times daily as needed (pain).   Estradiol 10 MCG Tabs vaginal tablet Place 1 tablet (10 mcg total) vaginally 2 (two) times a week.   Garlic 123XX123 MG Caps Take 1,000 mg by mouth daily.   lamoTRIgine 25 MG tablet Commonly known as: LAMICTAL Take 1 tablet (25 mg total) by mouth daily. What changed: when to take this   oxyCODONE-acetaminophen 5-325 MG tablet Commonly known as: PERCOCET/ROXICET Take 1-2 tablets by mouth every 6 (six) hours as needed for moderate pain or severe pain.   silodosin 8 MG Caps capsule Commonly known as: RAPAFLO Take 8 mg by mouth every evening.   traMADol  50 MG tablet Commonly known as: ULTRAM Take 50 mg by mouth 3 (three) times daily.   TURMERIC CURCUMIN PO Take 400 mg by mouth daily.   Uro-MP 118 MG Caps Take 118 mg by mouth daily as needed (UTI symptoms).   VITAMIN K2-VITAMIN D3 PO Take 1 capsule by mouth daily.            Durable Medical Equipment  (From admission, onward)         Start     Ordered   05/18/19 1259  For home use only DME 3 n 1  Once     05/18/19 1258   05/18/19 1259  For home use only DME Walker rolling   Once    Comments: Youth walker  Question:  Patient needs a walker to treat with the following condition  Answer:  Status post spinal surgery   05/18/19 1258         Follow-up Information    [place] Follow up.   Why: Home health services arranged          Signed: Earleen Newport 05/19/2019, 9:26 AM

## 2019-05-19 NOTE — Discharge Instructions (Signed)

## 2019-05-21 ENCOUNTER — Other Ambulatory Visit (HOSPITAL_COMMUNITY): Payer: Self-pay | Admitting: Neurological Surgery

## 2019-05-21 ENCOUNTER — Ambulatory Visit (HOSPITAL_COMMUNITY)
Admission: RE | Admit: 2019-05-21 | Discharge: 2019-05-21 | Disposition: A | Discharge status: Home / Self Care | Payer: Medicare Other | Source: Ambulatory Visit | Attending: Family | Admitting: Family

## 2019-05-21 ENCOUNTER — Other Ambulatory Visit: Payer: Self-pay

## 2019-05-21 DIAGNOSIS — R609 Edema, unspecified: Secondary | ICD-10-CM | POA: Diagnosis not present

## 2019-08-09 ENCOUNTER — Ambulatory Visit: Payer: Self-pay | Admitting: *Deleted

## 2019-08-09 NOTE — Telephone Encounter (Signed)
fyi

## 2019-08-09 NOTE — Telephone Encounter (Signed)
Has not made any urine sine yesterday afternoon >12 hours. Feels some pressure. Denies pain/fever. Checked with pcp they do not perform In/Out caths in the office. Advised patient to be seen at the ED for evaluation. Stated she would go now. She will call back for follow if needed.  Reason for Disposition . [1] Unable to urinate (or only a few drops) > 4 hours AND     [2] bladder feels very full (e.g., palpable bladder or strong urge to urinate)  Answer Assessment - Initial Assessment Questions 1. SYMPTOM: "What's the main symptom you're concerned about?" (e.g., frequency, incontinence)    Unable to void 2. ONSET: "When did the   start?"     Yesterday afternoon 3. PAIN: "Is there any pain?" If so, ask: "How bad is it?" (Scale: 1-10; mild, moderate, severe)     no 4. CAUSE: "What do you think is causing the symptoms?"     Unsure 5. OTHER SYMPTOMS: "Do you have any other symptoms?" (e.g., fever, flank pain, blood in urine, pain with urination)     None of these 6. PREGNANCY: "Is there any chance you are pregnant?" "When was your last menstrual period?"     Na  Protocols used: URINARY [facility]

## 2019-08-16 ENCOUNTER — Encounter (HOSPITAL_COMMUNITY): Payer: Self-pay | Admitting: Emergency Medicine

## 2019-08-16 ENCOUNTER — Emergency Department (HOSPITAL_COMMUNITY)
Admission: EM | Admit: 2019-08-16 | Discharge: 2019-08-16 | Disposition: A | Discharge status: Home / Self Care | Payer: Medicare Other | Attending: Emergency Medicine | Admitting: Emergency Medicine

## 2019-08-16 ENCOUNTER — Other Ambulatory Visit: Payer: Self-pay

## 2019-08-16 DIAGNOSIS — Z87891 Personal history of nicotine dependence: Secondary | ICD-10-CM | POA: Diagnosis not present

## 2019-08-16 DIAGNOSIS — Z79899 Other long term (current) drug therapy: Secondary | ICD-10-CM | POA: Insufficient documentation

## 2019-08-16 DIAGNOSIS — R339 Retention of urine, unspecified: Secondary | ICD-10-CM | POA: Diagnosis not present

## 2019-08-16 DIAGNOSIS — Z85828 Personal history of other malignant neoplasm of skin: Secondary | ICD-10-CM | POA: Diagnosis not present

## 2019-08-16 LAB — CBC
HCT: 32.7 % — ABNORMAL LOW (ref 36.0–46.0)
Hemoglobin: 10.8 g/dL — ABNORMAL LOW (ref 12.0–15.0)
MCH: 34 pg (ref 26.0–34.0)
MCHC: 33 g/dL (ref 30.0–36.0)
MCV: 102.8 fL — ABNORMAL HIGH (ref 80.0–100.0)
Platelets: 196 10*3/uL (ref 150–400)
RBC: 3.18 MIL/uL — ABNORMAL LOW (ref 3.87–5.11)
RDW: 13 % (ref 11.5–15.5)
WBC: 9.7 10*3/uL (ref 4.0–10.5)
nRBC: 0 % (ref 0.0–0.2)

## 2019-08-16 LAB — BASIC METABOLIC PANEL
Anion gap: 9 (ref 5–15)
BUN: 20 mg/dL (ref 8–23)
CO2: 27 mmol/L (ref 22–32)
Calcium: 9.6 mg/dL (ref 8.9–10.3)
Chloride: 98 mmol/L (ref 98–111)
Creatinine, Ser: 0.78 mg/dL (ref 0.44–1.00)
GFR calc Af Amer: >60 mL/min (ref >60)
GFR calc non Af Amer: >60 mL/min (ref >60)
Glucose, Bld: 92 mg/dL (ref 70–99)
Potassium: 4.8 mmol/L (ref 3.5–5.1)
Sodium: 134 mmol/L — ABNORMAL LOW (ref 135–145)

## 2019-08-16 NOTE — ED Provider Notes (Signed)
[facility] DEPT Provider Note   CSN: [id number] Arrival date & time: 08/16/19  2008     History Chief Complaint  Patient presents with  . Urinary Retention    Kaitlyn Flowers is a 78 y.o. female.  78 year old female presents with urinary retention.  Patient saw urologist today for similar symptoms and had a Foley catheter removed and was able to urinate fine at that time.  But throughout the day she began to develop suprapubic pressure and discomfort.  No fever or flank pain.  No dysuria.  Called her urologist who told her to come here to have a catheter placed        Past Medical History:  Diagnosis Date  . Anemia   . Arthritis   . Back pain   . Cancer ([place])    skin nose  . Depression    situational,due to pain  . Leg weakness   . Osteopenia   . PONV (postoperative nausea and vomiting)   . Right leg pain   . Seizures ([place])    05/13/2019- 4 years ago, "couldnt process" couldnt speak happened 2 times; none since  . 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  . Urinary tract infection   . Wears hearing aid     Patient Active Problem List   Diagnosis Date Noted  . Spondylolisthesis at L5-S1 level 05/17/2019  . Tension-type headache, not intractable 06/11/2017  . Right leg weakness 06/11/2017  . Labile hypertension 09/27/2016  . Autonomic dysfunction 09/27/2016  . Chronic pain syndrome 09/10/2016  . Localization-related idiopathic epilepsy and epileptic syndromes with seizures of localized onset, not intractable, without status epilepticus ([place]) 08/01/2016  . Hyponatremia 04/01/2016  . Aphasia   . Abdominal cramping 01/22/2016  . Nocturnal leg cramps 12/07/2015  . UTI (lower urinary tract infection) 10/02/2015  . 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 Sacroiliitis ([place]) 06/06/2014  . Lumbar radicular pain 06/06/2014  . Right low back pain 01/07/2014   . Anemia 01/07/2014  . Osteoporosis 12/26/2013  . Glucosuria 12/16/2012  . Tick bite of upper arm 12/16/2012  . UTI (urinary tract infection) 12/16/2012  . Back pain with radiation 06/12/2012  . Plantar wart of right foot 12/29/2011  . 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/11/2011  . Fatigue 11/11/2011  . 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 Urinary frequency 09/02/2011  . Dysuria 09/02/2011  . Medicare annual wellness visit, initial 12/18/2010  . Wears hearing aid 12/18/2010  . Medicare welcome visit 12/18/2010  . Back pain 11/23/2010  . Spinal cord tumor   . ABDOMINAL BRUIT 07/21/2009  . NEOPLASM, SKIN, UNCERTAIN BEHAVIOR [id number]  . HYPERKALEMIA 05/12/2007  . ANEMIA NOS 05/12/2007  . DISORDER, STOMACH FUNCTION NEC 04/07/2007    Past Surgical History:  Procedure Laterality Date  . ABDOMINAL EXPOSURE N/A 05/17/2019   Procedure: ABDOMINAL EXPOSURE;  Surgeon: Angelia Mould, MD;  Location: [facility];  Service: Vascular;  Laterality: N/A;  . ABDOMINAL HYSTERECTOMY  1973   1/2 of ovary left cyst on ovary  . ANTERIOR LUMBAR FUSION N/A 05/17/2019   Procedure: Lumbar five Sacral one  Anterior lumbar interbody fusion;  Surgeon: Kristeen Miss, MD;  Location: [facility];  Service: Neurosurgery;  Laterality: N/A;  . APPENDECTOMY     removed with hysterectomy  . COLONOSCOPY    . LUMBAR WOUND DEBRIDEMENT  12/06/2011   Procedure: LUMBAR WOUND DEBRIDEMENT;  Surgeon: Kristeen Miss, MD;  Location:  [facility];  Service: Neurosurgery;  Laterality: N/A;  Repair of Pseudomeningocele Lumbar wound  . ROTATOR CUFF REPAIR     left   . shoulder rotator culff  Left 2006  . spinal cord tumor  1987   2012 october      OB History   No obstetric history on file.     Family History  Problem Relation Age of Onset  . Stroke Mother        2010  . Heart attack Father   . Anesthesia problems Neg Hx     Social History   Tobacco Use  . Smoking status: Former Smoker    Packs/day:  0.25    Years: 2.00    Pack years: 0.50    Types: Cigarettes    Quit date: 09/03/1967    Years since quitting: 51.9  . Smokeless tobacco: Never Used  . Tobacco comment: quit 48 years   Substance Use Topics  . Alcohol use: Yes    Alcohol/week: 4.0 standard drinks    Types: 4 Glasses of wine per week    Comment: have a glass of wine with dinner   . Drug use: No    Home Medications Prior to Admission medications   Medication Sig Start Date End Date Taking? Authorizing Provider  Calcium Citrate-Vitamin D (CALCIUM + D PO) Take 2 tablets by mouth 2 (two) times daily.    [provider]  diazepam (VALIUM) 5 MG tablet Take 1 tablet (5 mg total) by mouth every 6 (six) hours as needed for muscle spasms. 05/19/19   Kristeen Miss, MD  diclofenac sodium (VOLTAREN) 1 % GEL Apply 1 application topically 4 (four) times daily as needed (pain).    [provider]  Estradiol 10 MCG TABS vaginal tablet Place 1 tablet (10 mcg total) vaginally 2 (two) times a week. 04/17/17   Carlyle Basques, MD  Garlic [id number] MG CAPS Take 1,000 mg by mouth daily.    [provider]  lamoTRIgine (LAMICTAL) 25 MG tablet Take 1 tablet (25 mg total) by mouth daily. Patient taking differently: Take 25 mg by mouth every evening.  06/06/17   Cameron Sprang, MD  Meth-Hyo-M Barnett Hatter Phos-Ph Sal (URO-MP) 118 MG CAPS Take 118 mg by mouth daily as needed (UTI symptoms).  01/11/16   [provider]  oxyCODONE-acetaminophen (PERCOCET/ROXICET) 5-325 MG tablet Take 1-2 tablets by mouth every 6 (six) hours as needed for moderate pain or severe pain. 05/19/19   Kristeen Miss, MD  silodosin (RAPAFLO) 8 MG CAPS capsule Take 8 mg by mouth every evening.  06/05/18   [provider]  traMADol (ULTRAM) 50 MG tablet Take 50 mg by mouth 3 (three) times daily.    [provider]  TURMERIC CURCUMIN PO Take 400 mg by mouth daily.    [provider]  Vitamin D-Vitamin K (VITAMIN K2-VITAMIN D3 PO) Take  1 capsule by mouth daily.    [provider]    Allergies    Ibuprofen-famotidine, Fentanyl, Pregabalin, and Penicillins  Review of Systems   Review of Systems  All other systems reviewed and are negative.   Physical Exam Updated Vital Signs BP 137/64 (BP Location: Right Arm)   Pulse 89   Temp 98.2 F (36.8 C) (Oral)   Resp 14   Ht 1.549 m (5\' 1" )   Wt 47.6 kg   LMP  (LMP Unknown)   SpO2 100%   BMI 19.84 kg/m   Physical Exam Vitals and nursing  note reviewed.  Constitutional:      General: She is not in acute distress.    Appearance: Normal appearance. She is well-developed. She is not toxic-appearing.  HENT:     Head: Normocephalic and atraumatic.  Eyes:     General: Lids are normal.     Conjunctiva/sclera: Conjunctivae normal.     Pupils: Pupils are equal, round, and reactive to light.  Neck:     Thyroid: No thyroid mass.     Trachea: No tracheal deviation.  Cardiovascular:     Rate and Rhythm: Normal rate and regular rhythm.     Heart sounds: Normal heart sounds. No murmur. No gallop.   Pulmonary:     Effort: Pulmonary effort is normal. No respiratory distress.     Breath sounds: Normal breath sounds. No stridor. No decreased breath sounds, wheezing, rhonchi or rales.  Abdominal:     General: Bowel sounds are normal. There is no distension.     Palpations: Abdomen is soft.     Tenderness: There is abdominal tenderness in the suprapubic area. There is no rebound.    Musculoskeletal:        General: No tenderness. Normal range of motion.     Cervical back: Normal range of motion and neck supple.  Skin:    General: Skin is warm and dry.     Findings: No abrasion or rash.  Neurological:     Mental Status: She is alert and oriented to person, place, and time.     GCS: GCS eye subscore is 4. GCS verbal subscore is 5. GCS motor subscore is 6.     Cranial Nerves: No cranial nerve deficit.     Sensory: No sensory deficit.  Psychiatric:        Speech:  Speech normal.        Behavior: Behavior normal.     ED Results / Procedures / Treatments   Labs (all labs ordered are listed, but only abnormal results are displayed) Labs Reviewed  BASIC METABOLIC PANEL - Abnormal; Notable for the following components:      Result Value   Sodium 134 (*)    All other components within normal limits  CBC - Abnormal; Notable for the following components:   RBC 3.18 (*)    Hemoglobin 10.8 (*)    HCT 32.7 (*)    MCV 102.8 (*)    All other components within normal limits  URINE CULTURE  URINALYSIS, ROUTINE W REFLEX MICROSCOPIC    EKG None  Radiology No results found.  Procedures Procedures (including critical care time)  Medications Ordered in ED Medications - No data to display  ED Course  I have reviewed the triage vital signs and the nursing notes.  Pertinent labs & imaging results that were available during my care of the patient were reviewed by me and considered in my medical decision making (see chart for details).    MDM Rules/Calculators/A&P                      Nursing to place Foley catheter and patient to follow-up with her urologist Final Clinical Impression(s) / ED Diagnoses Final diagnoses:  None    Rx / DC Orders ED Discharge Orders    None       Lacretia Leigh, MD 08/16/19 2221

## 2019-08-16 NOTE — ED Notes (Signed)
Pt verbalized discharge instructions and follow up care. Alert and ambulatory. No IV. Demonstrated proper use of leg bag

## 2019-08-16 NOTE — Discharge Instructions (Addendum)
Follow up with your urologist tomorrow

## 2019-08-17 ENCOUNTER — Other Ambulatory Visit: Payer: Self-pay | Admitting: Neurological Surgery

## 2019-08-17 DIAGNOSIS — M21371 Foot drop, right foot: Secondary | ICD-10-CM

## 2019-08-25 ENCOUNTER — Ambulatory Visit: Payer: Medicare Other | Attending: Internal Medicine

## 2019-08-25 DIAGNOSIS — Z20822 Contact with and (suspected) exposure to covid-19: Secondary | ICD-10-CM

## 2019-08-26 LAB — NOVEL CORONAVIRUS, NAA: SARS-CoV-2, NAA: NOT DETECTED

## 2019-09-01 ENCOUNTER — Other Ambulatory Visit: Payer: Self-pay

## 2019-09-01 ENCOUNTER — Ambulatory Visit
Admission: RE | Admit: 2019-09-01 | Discharge: 2019-09-01 | Disposition: A | Discharge status: Home / Self Care | Payer: Medicare Other | Source: Ambulatory Visit | Attending: Neurological Surgery | Admitting: Neurological Surgery

## 2019-09-01 DIAGNOSIS — M21371 Foot drop, right foot: Secondary | ICD-10-CM

## 2019-09-01 MED ORDER — GADOBENATE DIMEGLUMINE 529 MG/ML IV SOLN
9.0000 mL | Freq: Once | INTRAVENOUS | Status: AC | PRN
  Administered 2019-09-01: 9 mL via INTRAVENOUS

## 2019-09-08 ENCOUNTER — Other Ambulatory Visit: Payer: Self-pay

## 2019-09-08 ENCOUNTER — Encounter: Payer: Self-pay | Admitting: Neurology

## 2019-09-08 ENCOUNTER — Telehealth (INDEPENDENT_AMBULATORY_CARE_PROVIDER_SITE_OTHER): Payer: Medicare PPO | Admitting: Neurology

## 2019-09-08 VITALS — Ht 61.0 in | Wt 105.0 lb

## 2019-09-08 DIAGNOSIS — M21371 Foot drop, right foot: Secondary | ICD-10-CM | POA: Diagnosis not present

## 2019-09-08 DIAGNOSIS — M6281 Muscle weakness (generalized): Secondary | ICD-10-CM | POA: Diagnosis not present

## 2019-09-08 DIAGNOSIS — G40009 Localization-related (focal) (partial) idiopathic epilepsy and epileptic syndromes with seizures of localized onset, not intractable, without status epilepticus: Secondary | ICD-10-CM

## 2019-09-08 DIAGNOSIS — M4326 Fusion of spine, lumbar region: Secondary | ICD-10-CM | POA: Diagnosis not present

## 2019-09-08 MED ORDER — LAMOTRIGINE 25 MG PO TABS: 25.0000 mg | ORAL_TABLET | Freq: Every evening | ORAL | 3 refills | Status: DC

## 2019-09-08 NOTE — Progress Notes (Signed)
Virtual Visit via Video Note The purpose of this virtual visit is to provide medical care while limiting exposure to the novel coronavirus.    Consent was obtained for video visit:  Yes.   Answered questions that patient had about telehealth interaction:  Yes.   I discussed the limitations, risks, security and privacy concerns of performing an evaluation and management service by telemedicine. I also discussed with the patient that there may be a patient responsible charge related to this service. The patient expressed understanding and agreed to proceed.  Pt location: Home Physician Location: office Name of referring provider:  Burnis Medin, MD I connected with Kaitlyn Flowers at patients initiation/request on 09/08/2019 at  8:30 AM EST by video enabled telemedicine application and verified that I am speaking with the correct person using two identifiers. Pt MRN:  [id number] Pt DOB:  03-20-1941 Video Participants:  Kaitlyn Flowers   History of Present Illness:  The patient was seen as a virtual video visit on 09/08/2019. She was last seen over a year ago in the neurology clinic for seizures. She had a syncopal episode in 03/2016, 2 episodes of transient aphasia in 03/2016 and 07/2016. Stroke workup negative, her EEG in 2017 was abnormal with bilateral temporal slowing and left frontotemporal sharp waves. She has been on low dose Lamotrigine and had been seizure-free for 2 years, expressing interest in medication discontinuation, however her repeat EEG in 09/2018 again showed occasional epileptiform discharges over the left anterior temporal region. She has continued on very low dose Lamotrigine 25mg  qhs and denies any seizures or seizure-like symptoms since 2017. She felt sluggish and fuzzy on 50mg  dose. She denies any staring/unresponsive episodes, gaps in time, olfactory/gustatory hallucinations, myoclonic jerks. No headaches, dizziness, diplopia, no side effects on low dose  Lamotrigine.  Since her last visit, she has had lumbar surgery in 05/2019 due to substantial back pain with bilateral leg pain and right leg weakness. She reports that the pain had been better but recently flared up and down again. The shooting pain has resolved, however she still has a "needle-y, burning" pain in both lower legs. She has a right foot drop, with right foot numbness (better than before). She has a hard time putting on her shoe on the right. No falls. She has been doing PT.   History On Initial Assessment 05/29/2016: This is a very pleasant 79 yo RH woman who presented after a syncopal episode on 03/03/16 then a transient episode of aphasia on 03/25/16. She lives alone and was sitting outside last 03/03/16. She recalls turning around to get water, then blacked out. She woke up on the ground and found her glasses broken, she hit the right side of her face. No tongue bite/incontinence. She denied any prodromal symptoms, no dizziness or headaches. She had been drinking a lot of water due to recurrent UTIs since January 2017. She went to the ER, it was felt fall was likely a result of orthostatic hypotension from sitting out in the heat. No bloodwork on record from that visit. Then on 03/25/16 she was reading something off her phone and could not make sense of it. She showed the phone to her friend who said it looked fine. Then she found she could not "get/grab my words." Her friend drove her to the ER, she felt her friend was driving the wrong way and she was thinking this, but could not say it. This lasted 20-30 minutes, no associated focal numbness/tingling/weakness, headaches, or dizziness. Her BP  on arrival was 139/72. I personally reviewed head CT without contrast which did not show any acute changes. She was found to have a sodium level of 123. She was also diagnosed with another UTI with >100,000 aerococcus urinae. She denies any further recurrence of symptoms. She denies any olfactory/gustatory  hallucinations, deja vu, rising epigastric sensation, focal numbness/tingling/weakness, myoclonic jerks. She has chronic back pain and self-catheterizes herself every night. She has chronic right leg numbness and weakness. For the past 1-1/2 years, she has had pain the right sacral area, worsened by sitting. She continues to work with PT.   She underwent a stroke workup, I personally reviewed images available. 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 MRA head did not show any intracranial stenosis. Carotid dopplers did not show any evidence of ICA stenosis bilaterally. Her echocardiogram showed normal EF 60-65%, normal RV size and systolic function, no significant valvular abnormalities, left atrium normal size. Lipid panel showed an LDL of 114, total cholesterol 241. She started to take a daily baby aspirin.  She saw her PCP on 11/6 reporting random headaches and wondered if related to her BP. She was scheduled for an earlier appointment today, then yesterday had another transient episode of aphasia. 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 When she tried to speak, her words were jumbled. She states the aphasia lasted 30-40 minutes. A friend took her home then to her PCP office at noon, where bloodwork was done. She went to the ER at 11pm, reporting severe headache that started an hour prior. She had an intense pressure and throbbing over the left temporal region, 8/10, with nausea and vomiting. Headache lasted until this morning. No photo/phono/phonophobia. There was no aphasia noted in the ER, headache was treated with IV fluids and APAP. She denied any focal weakness or paresthesias. She denied having a headache with the previous episode of aphasia. She has no prior history of migraines. Over the past 2 weeks, she has been having random headaches  localized on different parts of her head, occurring every 3-4 days, resolving after Tylenol intake.   Her mother had a stroke. Otherwise she had a normal birth and early development.  There is no history of febrile convulsions, CNS infections such as meningitis/encephalitis, significant traumatic brain injury, neurosurgical procedures, or family history of seizures.   Current Outpatient Medications on File Prior to Visit  Medication Sig Dispense Refill  . Calcium Citrate-Vitamin D (CALCIUM + D PO) Take 2 tablets by mouth 2 (two) times daily.    . diazepam (VALIUM) 5 MG tablet Take 1 tablet (5 mg total) by mouth every 6 (six) hours as needed for muscle spasms. 30 tablet 0  . diclofenac sodium (VOLTAREN) 1 % GEL Apply 1 application topically 4 (four) times daily as needed (pain).    . Estradiol 10 MCG TABS vaginal tablet Place 1 tablet (10 mcg total) vaginally 2 (two) times a week. 8 tablet 11  . Garlic [id number] MG CAPS Take 1,000 mg by mouth daily.    . Meth-Hyo-M Bl-Na Phos-Ph Sal (URO-MP) 118 MG CAPS Take 118 mg by mouth daily as needed (UTI symptoms).   3  . silodosin (RAPAFLO) 8 MG CAPS capsule Take 8 mg by mouth every evening.   0  . traMADol (ULTRAM) 50 MG tablet Take 50 mg by mouth 3 (three) times daily.    Marland Kitchen  TURMERIC CURCUMIN PO Take 400 mg by mouth daily.    . Vitamin D-Vitamin K (VITAMIN K2-VITAMIN D3 PO) Take 1 capsule by mouth daily.     No current facility-administered medications on file prior to visit.     Observations/Objective:   Vitals:   09/08/19 0813  Weight: 105 lb (47.6 kg)  Height: 5\' 1"  (1.549 m)   GEN:  The patient appears stated age and is in NAD.  Neurological examination: Patient is awake, alert, oriented x 3. No aphasia or dysarthria. Intact fluency and comprehension. Remote and recent memory intact. Able to name and repeat. Cranial nerves: Extraocular movements intact with no nystagmus. No facial asymmetry. Motor: Unable to do formal muscle testing, at least  antigravity on all extremities with right foot drop with steppage gait.   Assessment and Plan:   This is a pleasant 79 yo RH woman who had a syncopal episode on 03/03/16, then a transient episode of expressive aphasia last 03/25/16. Stroke workup was unremarkable, MRI brain did not show any acute changes, no significant stenosis seen intracranially or with carotid dopplers. Echo normal. She had another transient episode of aphasia on 07/21/16, this time followed by intense headache. No headache with previous event. Her EEG was abnormal with bilateral temporal slowing and rare left frontotemporal epileptiform discharges. 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 Repeat EEG in 09/2018 showed similar findings. We discussed the need for seizure medication due to abnormal EEG (increased risk for recurrent seizures). She has been on a very low dose of Lamotrigine 25mg  qhs with no seizures since 07/2016. Her main issue today is the right foot drop that has not improved post-lumbar surgery. She also reports burning paresthesias in both legs. We discussed the option of doing an EMG/NCV of both legs, she will discuss this with Dr. Ellene Route. Continue PT. She is aware of [place] driving laws to stop driving after a seizure until 6 months seizure-free. She will follow-up in 6 months and knows to call for any changes.   Follow Up Instructions:   -I discussed the assessment and treatment plan with the patient. The patient was provided an opportunity to ask questions and all were answered. The patient agreed with the plan and demonstrated an understanding of the instructions.   The patient was advised to call back or seek an in-person evaluation if the symptoms worsen or if the condition fails to improve as anticipated.     Cameron Sprang, MD

## 2019-09-09 ENCOUNTER — Ambulatory Visit: Payer: Medicare Other | Attending: Internal Medicine

## 2019-09-09 DIAGNOSIS — Z20822 Contact with and (suspected) exposure to covid-19: Secondary | ICD-10-CM

## 2019-09-10 DIAGNOSIS — M21371 Foot drop, right foot: Secondary | ICD-10-CM | POA: Diagnosis not present

## 2019-09-11 LAB — NOVEL CORONAVIRUS, NAA: SARS-CoV-2, NAA: DETECTED — AB

## 2019-09-13 ENCOUNTER — Encounter: Payer: Self-pay | Admitting: *Deleted

## 2019-09-13 ENCOUNTER — Telehealth: Payer: Self-pay | Admitting: *Deleted

## 2019-09-13 ENCOUNTER — Other Ambulatory Visit: Payer: Self-pay | Admitting: Nurse Practitioner

## 2019-09-13 DIAGNOSIS — U071 COVID-19: Secondary | ICD-10-CM

## 2019-09-13 NOTE — Telephone Encounter (Signed)
Sorry you are not feeling well.     Can set up a virtual visit  Or phone to discuss any concerns that I can help you with     Also  proceed and get the monoclonal antibody infusion   As it seems to help people from getting complications of  COVID 19  And get better quicker

## 2019-09-13 NOTE — Progress Notes (Signed)
  I connected by phone with Kaitlyn Flowers on 09/13/2019 at 2:39 PM to discuss the potential use of an new treatment for mild to moderate COVID-19 viral infection in non-hospitalized patients.  This patient is a 79 y.o. female that meets the FDA criteria for Emergency Use Authorization of bamlanivimab or casirivimab\imdevimab.  Has a (+) direct SARS-CoV-2 viral test result  Has mild or moderate COVID-19   Is ? 78 years of age and weighs ? 40 kg  Is NOT hospitalized due to COVID-19  Is NOT requiring oxygen therapy or requiring an increase in baseline oxygen flow rate due to COVID-19  Is within 10 days of symptom onset  Has at least one of the high risk factor(s) for progression to severe COVID-19 and/or hospitalization as defined in EUA.  Specific high risk criteria : >/= 78 yo   I have spoken and communicated the following to the patient or parent/caregiver:  1. FDA has authorized the emergency use of bamlanivimab and casirivimab\imdevimab for the treatment of mild to moderate COVID-19 in adults and pediatric patients with positive results of direct SARS-CoV-2 viral testing who are 54 years of age and older weighing at least 40 kg, and who are at high risk for progressing to severe COVID-19 and/or hospitalization.  2. The significant known and potential risks and benefits of bamlanivimab and casirivimab\imdevimab, and the extent to which such potential risks and benefits are unknown.  3. Information on available alternative treatments and the risks and benefits of those alternatives, including clinical trials.  4. Patients treated with bamlanivimab and casirivimab\imdevimab should continue to self-isolate and use infection control measures (e.g., wear mask, isolate, social distance, avoid sharing personal items, clean and disinfect "high touch" surfaces, and frequent handwashing) according to CDC guidelines.   5. The patient or parent/caregiver has the option to accept or refuse  bamlanivimab or casirivimab\imdevimab .  After reviewing this information with the patient, The patient agreed to proceed with receiving the bamlanimivab infusion and will be provided a copy of the Fact sheet prior to receiving the infusion.Fenton Foy 09/13/2019 2:39 PM

## 2019-09-13 NOTE — Telephone Encounter (Signed)
Patient informed. 

## 2019-09-13 NOTE — Telephone Encounter (Signed)
Please advise 

## 2019-09-13 NOTE — Telephone Encounter (Signed)
Copied from [facility] [id number]. Topic: General - Other >> Sep 13, 2019 11:23 AM Leward Quan A wrote: Reason for CRM: Patient called to inquire of Dr Regis Bill about what she should be doing since testing positive for covid and also getting her set up for the Covid infusion states that she is feeling horrible. Please call patient at Ph# [phone]

## 2019-09-16 ENCOUNTER — Ambulatory Visit (HOSPITAL_COMMUNITY)
Admission: RE | Admit: 2019-09-16 | Discharge: 2019-09-16 | Disposition: A | Discharge status: Home / Self Care | Payer: Medicare Other | Source: Ambulatory Visit | Attending: Pulmonary Disease | Admitting: Pulmonary Disease

## 2019-09-16 DIAGNOSIS — Z23 Encounter for immunization: Secondary | ICD-10-CM | POA: Insufficient documentation

## 2019-09-16 DIAGNOSIS — U071 COVID-19: Secondary | ICD-10-CM | POA: Diagnosis present

## 2019-09-16 MED ORDER — METHYLPREDNISOLONE SODIUM SUCC 125 MG IJ SOLR: 125.0000 mg | Freq: Once | INTRAMUSCULAR | Status: DC | PRN

## 2019-09-16 MED ORDER — EPINEPHRINE 0.3 MG/0.3ML IJ SOAJ: 0.3000 mg | Freq: Once | INTRAMUSCULAR | Status: DC | PRN

## 2019-09-16 MED ORDER — ALBUTEROL SULFATE HFA 108 (90 BASE) MCG/ACT IN AERS: 2.0000 | INHALATION_SPRAY | Freq: Once | RESPIRATORY_TRACT | Status: DC | PRN

## 2019-09-16 MED ORDER — DIPHENHYDRAMINE HCL 50 MG/ML IJ SOLN: 50.0000 mg | Freq: Once | INTRAMUSCULAR | Status: DC | PRN

## 2019-09-16 MED ORDER — SODIUM CHLORIDE 0.9 % IV SOLN
700.0000 mg | Freq: Once | INTRAVENOUS | Status: AC
  Administered 2019-09-16: 700 mg via INTRAVENOUS
  Filled 2019-09-16: qty 20

## 2019-09-16 MED ORDER — SODIUM CHLORIDE 0.9 % IV SOLN: INTRAVENOUS | Status: DC | PRN

## 2019-09-16 MED ORDER — FAMOTIDINE IN NACL 20-0.9 MG/50ML-% IV SOLN: 20.0000 mg | Freq: Once | INTRAVENOUS | Status: DC | PRN

## 2019-09-16 NOTE — Progress Notes (Signed)
Patient ID: Kaitlyn Flowers, female   DOB: 25-Jun-1941, 79 y.o.   MRN: [id number]  Diagnosis: [id number]  Physician:  Procedure: Covid Infusion Clinic Med: bamlanivimab infusion - Provided patient with bamlanimivab fact sheet for patients, parents and caregivers prior to infusion.  Complications: No immediate complications noted.  Discharge: Discharged home   Heide Scales 09/16/2019

## 2019-09-16 NOTE — Discharge Instructions (Signed)

## 2019-09-18 ENCOUNTER — Ambulatory Visit (HOSPITAL_COMMUNITY)
Admission: EM | Admit: 2019-09-18 | Discharge: 2019-09-18 | Disposition: A | Discharge status: Home / Self Care | Payer: Medicare PPO | Attending: Family Medicine | Admitting: Family Medicine

## 2019-09-18 ENCOUNTER — Other Ambulatory Visit: Payer: Medicare Other

## 2019-09-18 ENCOUNTER — Ambulatory Visit (INDEPENDENT_AMBULATORY_CARE_PROVIDER_SITE_OTHER): Payer: Medicare PPO

## 2019-09-18 ENCOUNTER — Other Ambulatory Visit: Payer: Self-pay

## 2019-09-18 ENCOUNTER — Encounter (HOSPITAL_COMMUNITY): Payer: Self-pay

## 2019-09-18 DIAGNOSIS — U071 COVID-19: Secondary | ICD-10-CM

## 2019-09-18 DIAGNOSIS — E860 Dehydration: Secondary | ICD-10-CM | POA: Diagnosis not present

## 2019-09-18 DIAGNOSIS — R0602 Shortness of breath: Secondary | ICD-10-CM | POA: Diagnosis not present

## 2019-09-18 DIAGNOSIS — N3000 Acute cystitis without hematuria: Secondary | ICD-10-CM

## 2019-09-18 DIAGNOSIS — E871 Hypo-osmolality and hyponatremia: Secondary | ICD-10-CM | POA: Diagnosis not present

## 2019-09-18 DIAGNOSIS — J1282 Pneumonia due to coronavirus disease 2019: Secondary | ICD-10-CM

## 2019-09-18 LAB — BASIC METABOLIC PANEL
Anion gap: 13 (ref 5–15)
BUN: 12 mg/dL (ref 8–23)
CO2: 23 mmol/L (ref 22–32)
Calcium: 7.6 mg/dL — ABNORMAL LOW (ref 8.9–10.3)
Chloride: 91 mmol/L — ABNORMAL LOW (ref 98–111)
Creatinine, Ser: 0.68 mg/dL (ref 0.44–1.00)
GFR calc Af Amer: >60 mL/min (ref >60)
GFR calc non Af Amer: >60 mL/min (ref >60)
Glucose, Bld: 109 mg/dL — ABNORMAL HIGH (ref 70–99)
Potassium: 3.4 mmol/L — ABNORMAL LOW (ref 3.5–5.1)
Sodium: 127 mmol/L — ABNORMAL LOW (ref 135–145)

## 2019-09-18 LAB — POCT URINALYSIS DIP (DEVICE)
Bilirubin Urine: NEGATIVE
Glucose, UA: NEGATIVE mg/dL
Ketones, ur: 40 mg/dL — AB
Nitrite: POSITIVE — AB
Protein, ur: 30 mg/dL — AB
Specific Gravity, Urine: 1.015 (ref 1.005–1.030)
Urobilinogen, UA: 1 mg/dL (ref 0.0–1.0)
pH: 6 (ref 5.0–8.0)

## 2019-09-18 LAB — CBC
HCT: 33.2 % — ABNORMAL LOW (ref 36.0–46.0)
Hemoglobin: 11.5 g/dL — ABNORMAL LOW (ref 12.0–15.0)
MCH: 32.6 pg (ref 26.0–34.0)
MCHC: 34.6 g/dL (ref 30.0–36.0)
MCV: 94.1 fL (ref 80.0–100.0)
Platelets: 228 10*3/uL (ref 150–400)
RBC: 3.53 MIL/uL — ABNORMAL LOW (ref 3.87–5.11)
RDW: 13.2 % (ref 11.5–15.5)
WBC: 10.1 10*3/uL (ref 4.0–10.5)
nRBC: 0 % (ref 0.0–0.2)

## 2019-09-18 MED ORDER — CEPHALEXIN 500 MG PO CAPS: 500.0000 mg | ORAL_CAPSULE | Freq: Two times a day (BID) | ORAL | 0 refills | Status: AC

## 2019-09-18 MED ORDER — SODIUM CHLORIDE 0.9 % IV BOLUS
500.0000 mL | Freq: Once | INTRAVENOUS | Status: AC
  Administered 2019-09-18: 500 mL via INTRAVENOUS

## 2019-09-18 MED ORDER — ONDANSETRON HCL 4 MG PO TABS: 4.0000 mg | ORAL_TABLET | Freq: Three times a day (TID) | ORAL | 0 refills | Status: DC | PRN

## 2019-09-18 NOTE — ED Triage Notes (Signed)
Pt present dehydration and extremely fatigue. Symptoms started a week ago.

## 2019-09-18 NOTE — ED Notes (Signed)
Re-catheterized patient, urine sample obtained. Orthostatics repeated, provider notified.

## 2019-09-18 NOTE — Discharge Instructions (Signed)
Begin taking the keflex tonight, it is 1 tablet 2 times a day for 5 days  I would like for you to return tomorrow for recheck of your blood work and re-evaluation.  Take the zofran tonight, drink pedialyte tonight and drink water with food.  If you have worsening shortness of breath, chest pain, feel like you will pass out, high fever, call 911 or go to the Emergency Department.

## 2019-09-18 NOTE — ED Notes (Signed)
This RN assisted pt in self-catheterizing. No urine obtained. Will retry after fluid bolus admin.

## 2019-09-18 NOTE — ED Provider Notes (Signed)
[place]    CSN: [id number] Arrival date & time: 09/18/19  1435      History   Chief Complaint Chief Complaint  Patient presents with  . Dehydration  . Fatigue    HPI Kaitlyn Flowers is a 79 y.o. female.   Patient reports to urgent care today for dehydration and worsening Covid symptoms. Patient was Covid Positive on 09/09/2019 and is now status post bamlanivimab infusion on 09/16/2019. She reports intermittent improvement in her fatigue, nausea, vomiting and shortness of breath until following this infusion. Since then she has noticed worsening fatigue, continued intolerance of food or water intake, and also shortness of breath. She denies chest pain. She does report her cough returned today and seemed worse but is non-productive.  She feels very dehydrated and notes dry mouth. She denies fever and chills. She reports some mild diarrhea that is also new to starting in the last 2-3 days.   She has not taken anything for the nausea and vomiting.   She has a history of urinary retention and utilized a straight catheter to urinate, but she has not noted lower abdominal pain or strong odor.      Past Medical History:  Diagnosis Date  . Anemia   . Arthritis   . Back pain   . Cancer ([place])    skin nose  . Depression    situational,due to pain  . Leg weakness   . Osteopenia   . PONV (postoperative nausea and vomiting)   . Right leg pain   . Seizures ([place])    05/13/2019- 4 years ago, "couldnt process" couldnt speak happened 2 times; none since  . 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  . Urinary tract infection   . Wears hearing aid     Patient Active Problem List   Diagnosis Date Noted  . Spondylolisthesis at L5-S1 level 05/17/2019  . Tension-type headache, not intractable 06/11/2017  . Right leg weakness 06/11/2017  . Labile hypertension 09/27/2016  . Autonomic dysfunction 09/27/2016  . Chronic pain syndrome  09/10/2016  . Localization-related idiopathic epilepsy and epileptic syndromes with seizures of localized onset, not intractable, without status epilepticus ([place]) 08/01/2016  . Hyponatremia 04/01/2016  . Aphasia   . Abdominal cramping 01/22/2016  . Nocturnal leg cramps 12/07/2015  . UTI (lower urinary tract infection) 10/02/2015  . 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 Sacroiliitis ([place]) 06/06/2014  . Lumbar radicular pain 06/06/2014  . Right low back pain 01/07/2014  . Anemia 01/07/2014  . Osteoporosis 12/26/2013  . Glucosuria 12/16/2012  . Tick bite of upper arm 12/16/2012  . UTI (urinary tract infection) 12/16/2012  . Back pain with radiation 06/12/2012  . Plantar wart of right foot 12/29/2011  . 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/11/2011  . Fatigue 11/11/2011  . 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 Urinary frequency 09/02/2011  . Dysuria 09/02/2011  . Medicare annual wellness visit, initial 12/18/2010  . Wears hearing aid 12/18/2010  . Medicare welcome visit 12/18/2010  . Back pain 11/23/2010  . Spinal cord tumor   . ABDOMINAL BRUIT 07/21/2009  . NEOPLASM, SKIN, UNCERTAIN BEHAVIOR [id number]  . HYPERKALEMIA 05/12/2007  . ANEMIA NOS 05/12/2007  . DISORDER, STOMACH FUNCTION NEC 04/07/2007    Past Surgical History:  Procedure Laterality Date  . ABDOMINAL EXPOSURE N/A 05/17/2019   Procedure: ABDOMINAL EXPOSURE;  Surgeon: Angelia Mould, MD;  Location: [facility];  Service: Vascular;  Laterality: N/A;  . ABDOMINAL HYSTERECTOMY  1973   1/2 of ovary left cyst on ovary  . ANTERIOR LUMBAR FUSION N/A 05/17/2019   Procedure: Lumbar five Sacral one  Anterior lumbar interbody fusion;  Surgeon: Kristeen Miss, MD;  Location: [facility];  Service: Neurosurgery;  Laterality: N/A;  . APPENDECTOMY     removed with hysterectomy  . COLONOSCOPY    . LUMBAR WOUND DEBRIDEMENT  12/06/2011   Procedure: LUMBAR WOUND DEBRIDEMENT;  Surgeon: Kristeen Miss, MD;  Location: [facility];  Service: Neurosurgery;  Laterality: N/A;  Repair of Pseudomeningocele Lumbar wound  . ROTATOR CUFF REPAIR     left   . shoulder rotator culff  Left 2006  . spinal cord tumor  1987   2012 october     OB History   No obstetric history on file.      Home Medications    Prior to Admission medications   Medication Sig Start Date End Date Taking? Authorizing Provider  Calcium Citrate-Vitamin D (CALCIUM + D PO) Take 2 tablets by mouth 2 (two) times daily.    [provider]  cephALEXin (KEFLEX) 500 MG capsule Take 1 capsule (500 mg total) by mouth 2 (two) times daily for 5 days. 09/18/19 09/23/19  Jazlene Bares, Marguerita Beards, PA-C  diazepam (VALIUM) 5 MG tablet Take 1 tablet (5 mg total) by mouth every 6 (six) hours as needed for muscle spasms. 05/19/19   Kristeen Miss, MD  diclofenac sodium (VOLTAREN) 1 % GEL Apply 1 application topically 4 (four) times daily as needed (pain).    [provider]  Estradiol 10 MCG TABS vaginal tablet Place 1 tablet (10 mcg total) vaginally 2 (two) times a week. 04/17/17   Carlyle Basques, MD  Garlic [id number] MG CAPS Take 1,000 mg by mouth daily.    [provider]  lamoTRIgine (LAMICTAL) 25 MG tablet Take 1 tablet (25 mg total) by mouth every evening. 09/08/19   Cameron Sprang, MD  Meth-Hyo-M Barnett Hatter Phos-Ph Sal (URO-MP) 118 MG CAPS Take 118 mg by mouth daily as needed (UTI symptoms).  01/11/16   [provider]  ondansetron (ZOFRAN) 4 MG tablet Take 1 tablet (4 mg total) by mouth every 8 (eight) hours as needed for nausea or vomiting. 09/18/19   Korinna Tat, Marguerita Beards, PA-C  silodosin (RAPAFLO) 8 MG CAPS capsule Take 8 mg by mouth every evening.  06/05/18   [provider]  traMADol (ULTRAM) 50 MG tablet Take 50 mg by mouth 3 (three) times daily.    [provider]  TURMERIC CURCUMIN PO Take 400 mg by mouth daily.    [provider]  Vitamin D-Vitamin K (VITAMIN K2-VITAMIN D3 PO) Take 1 capsule  by mouth daily.    [provider]    Family History Family History  Problem Relation Age of Onset  . Stroke Mother        2010  . Heart attack Father   . Anesthesia problems Neg Hx     Social History Social History   Tobacco Use  . Smoking status: Former Smoker    Packs/day: 0.25    Years: 2.00    Pack years: 0.50    Types: Cigarettes    Quit date: 09/03/1967    Years since quitting: 52.0  . Smokeless tobacco: Never Used  . Tobacco comment: quit 48 years   Substance Use Topics  . Alcohol use: Yes    Alcohol/week: 4.0 standard drinks    Types: 4 Glasses  of wine per week    Comment: have a glass of wine with dinner   . Drug use: No     Allergies   Ibuprofen-famotidine, Fentanyl, Pregabalin, and Penicillins   Review of Systems Review of Systems  Constitutional: Positive for appetite change and fatigue. Negative for chills and fever.  HENT: Negative for congestion.   Respiratory: Positive for cough and shortness of breath.   Cardiovascular: Negative for chest pain and palpitations.  Gastrointestinal: Positive for diarrhea, nausea and vomiting. Negative for abdominal pain and blood in stool.  Genitourinary: Positive for difficulty urinating. Negative for dysuria, flank pain and pelvic pain.  Musculoskeletal: Negative for arthralgias, back pain and myalgias.  Skin: Negative for rash.  Neurological: Positive for light-headedness. Negative for syncope, speech difficulty and headaches.     Physical Exam Triage Vital Signs ED Triage Vitals  Enc Vitals Group     BP 09/18/19 1608 131/61     Pulse Rate 09/18/19 1608 91     Resp 09/18/19 1608 16     Temp 09/18/19 1608 97.8 F (36.6 C)     Temp Source 09/18/19 1608 Oral     SpO2 09/18/19 1608 98 %     Weight --      Height --      Head Circumference --      Peak Flow --      Pain Score 09/18/19 1611 0     Pain Loc --      Pain Edu? --      Excl. in [place]? --    Orthostatic VS for the past 24 hrs:  BP-  Lying Pulse- Lying BP- Sitting Pulse- Sitting BP- Standing at 0 minutes Pulse- Standing at 0 minutes  09/18/19 1744 148/73 92 156/73 97 140/72 101  09/18/19 1644 145/70 89 140/70 93 116/65 100    Updated Vital Signs BP 131/61 (BP Location: Right Arm)   Pulse 91   Temp 97.8 F (36.6 C) (Oral)   Resp 16   LMP  (LMP Unknown)   SpO2 98%   Visual Acuity Right Eye Distance:   Left Eye Distance:   Bilateral Distance:    Right Eye Near:   Left Eye Near:    Bilateral Near:     Physical Exam Vitals and nursing note reviewed.  Constitutional:      General: She is not in acute distress.    Appearance: She is well-developed. She is ill-appearing.     Comments: Appears fatigued  HENT:     Head: Normocephalic and atraumatic.     Mouth/Throat:     Mouth: Mucous membranes are dry.     Pharynx: Oropharynx is clear.  Eyes:     Conjunctiva/sclera: Conjunctivae normal.     Pupils: Pupils are equal, round, and reactive to light.  Cardiovascular:     Rate and Rhythm: Normal rate and regular rhythm.     Heart sounds: No murmur. No friction rub. No gallop.   Pulmonary:     Effort: Pulmonary effort is normal. No respiratory distress.     Breath sounds: Normal breath sounds. No wheezing, rhonchi or rales.  Abdominal:     General: Bowel sounds are normal.     Palpations: Abdomen is soft.     Tenderness: There is no abdominal tenderness. There is no right CVA tenderness or left CVA tenderness.  Musculoskeletal:     Cervical back: Neck supple.     Right lower leg: No edema.     Left  lower leg: No edema.  Skin:    General: Skin is warm and dry.     Capillary Refill: Capillary refill takes less than 2 seconds.     Coloration: Skin is not jaundiced.     Findings: No rash.  Neurological:     General: No focal deficit present.     Mental Status: She is alert and oriented to person, place, and time.  Psychiatric:        Mood and Affect: Mood normal.        Behavior: Behavior normal.         Thought Content: Thought content normal.        Judgment: Judgment normal.      UC Treatments / Results  Labs (all labs ordered are listed, but only abnormal results are displayed) Labs Reviewed  BASIC METABOLIC PANEL - Abnormal; Notable for the following components:      Result Value   Sodium 127 (*)    Potassium 3.4 (*)    Chloride 91 (*)    Glucose, Bld 109 (*)    Calcium 7.6 (*)    All other components within normal limits  CBC - Abnormal; Notable for the following components:   RBC 3.53 (*)    Hemoglobin 11.5 (*)    HCT 33.2 (*)    All other components within normal limits  POCT URINALYSIS DIP (DEVICE) - Abnormal; Notable for the following components:   Ketones, ur 40 (*)    Hgb urine dipstick SMALL (*)    Protein, ur 30 (*)    Nitrite POSITIVE (*)    Leukocytes,Ua LARGE (*)    All other components within normal limits  URINE CULTURE    EKG   Radiology DG Chest 2 View  Result Date: 09/18/2019 CLINICAL DATA:  79 year old female with positive COVID-19 and increasing shortness of breath EXAM: CHEST - 2 VIEW COMPARISON:  Chest radiograph dated 08/09/2016. FINDINGS: Bilateral predominantly peripheral and subpleural hazy densities, new since the prior radiograph most consistent with multifocal pneumonia, likely viral or atypical in etiology. Clinical correlation and follow-up recommended. There is no pleural effusion or pneumothorax. The cardiac silhouette is within normal is. No acute osseous pathology. Osteopenia with degenerative changes of spine. IMPRESSION: Multifocal pneumonia, likely viral or atypical in etiology. Clinical correlation and follow-up recommended. Electronically Signed   By: Anner Crete M.D.   On: 09/18/2019 18:18    Procedures Procedures (including critical care time)  Medications Ordered in UC Medications  sodium chloride 0.9 % bolus 500 mL (0 mLs Intravenous Stopped 09/18/19 1744)    Initial Impression / Assessment and Plan / UC Course  I  have reviewed the triage vital signs and the nursing notes.  Pertinent labs & imaging results that were available during my care of the patient were reviewed by me and considered in my medical decision making (see chart for details).     #COVID #Dehydration #UTI - Orthostatic [id number] systolic. 550ml NS bolus given patient with improvement in orthostatic 148-140. Chest xray consistent with Viral pneumonia, considering Covid infection this correlates. Saturating and 95-98% with subjective shortness of breath. UA with nitirites and leukocytes, treating with Keflex. BMP- NA 127, K 3.4, Ca 7.6- done pre- 555ml bolus of NS.  - Discussion with Attending on shift about ED referral for continued observation vs discharge to home with return tomorrow for re-evaluation of Sodium and respiratory status. Given she had improvement follow NS bolus, is saturating well, ambulating without issue and neurological well and  UTI is likely causing acute worsening symptoms, feel she can follow up tomorrow for re-evaluation and lab draw. Discussed this with patient, she feels ok with this plan and given strict ED precautions of worsening shortness of breath, fever. She agrees to return at 10 AM tomorrow to be evaluated.  - zofran given for nausea, recommended pedialyte and small meals  Final Clinical Impressions(s) / UC Diagnoses   Final diagnoses:  Dehydration  COVID-19  Hyponatremia  Acute cystitis without hematuria     Discharge Instructions     Begin taking the keflex tonight, it is 1 tablet 2 times a day for 5 days  I would like for you to return tomorrow for recheck of your blood work and re-evaluation.  Take the zofran tonight, drink pedialyte tonight and drink water with food.  If you have worsening shortness of breath, chest pain, feel like you will pass out, high fever, call 911 or go to the Emergency Department.       ED Prescriptions    Medication Sig Dispense Auth. Provider   cephALEXin  (KEFLEX) 500 MG capsule Take 1 capsule (500 mg total) by mouth 2 (two) times daily for 5 days. 10 capsule Maximillian Habibi, Marguerita Beards, PA-C   ondansetron (ZOFRAN) 4 MG tablet Take 1 tablet (4 mg total) by mouth every 8 (eight) hours as needed for nausea or vomiting. 4 tablet Hubbard Seldon, Marguerita Beards, PA-C     PDMP not reviewed this encounter.   Purnell Shoemaker, PA-C 09/18/19 1848

## 2019-09-19 ENCOUNTER — Ambulatory Visit (HOSPITAL_COMMUNITY)
Admission: EM | Admit: 2019-09-19 | Discharge: 2019-09-19 | Disposition: A | Discharge status: Home / Self Care | Payer: Medicare PPO | Attending: Physician Assistant | Admitting: Physician Assistant

## 2019-09-19 ENCOUNTER — Encounter (HOSPITAL_COMMUNITY): Payer: Self-pay

## 2019-09-19 DIAGNOSIS — E871 Hypo-osmolality and hyponatremia: Secondary | ICD-10-CM | POA: Insufficient documentation

## 2019-09-19 DIAGNOSIS — E876 Hypokalemia: Secondary | ICD-10-CM

## 2019-09-19 DIAGNOSIS — U071 COVID-19: Secondary | ICD-10-CM | POA: Insufficient documentation

## 2019-09-19 LAB — BASIC METABOLIC PANEL
Anion gap: 13 (ref 5–15)
BUN: 10 mg/dL (ref 8–23)
CO2: 22 mmol/L (ref 22–32)
Calcium: 7.3 mg/dL — ABNORMAL LOW (ref 8.9–10.3)
Chloride: 96 mmol/L — ABNORMAL LOW (ref 98–111)
Creatinine, Ser: 0.73 mg/dL (ref 0.44–1.00)
GFR calc Af Amer: >60 mL/min (ref >60)
GFR calc non Af Amer: >60 mL/min (ref >60)
Glucose, Bld: 100 mg/dL — ABNORMAL HIGH (ref 70–99)
Potassium: 3.2 mmol/L — ABNORMAL LOW (ref 3.5–5.1)
Sodium: 131 mmol/L — ABNORMAL LOW (ref 135–145)

## 2019-09-19 MED ORDER — POTASSIUM CHLORIDE CRYS ER 20 MEQ PO TBCR: 20.0000 meq | EXTENDED_RELEASE_TABLET | Freq: Every day | ORAL | 0 refills | Status: DC

## 2019-09-19 NOTE — ED Triage Notes (Signed)
Patient presents to Urgent Care with complaints of continued shortness of breath since being diagnosed w/ covid. Patient reports is here today for follow up of blood work she had drawn yesterday.

## 2019-09-19 NOTE — ED Provider Notes (Signed)
[facility]    CSN: [id number] Arrival date & time: 09/19/19  1012      History   Chief Complaint Chief Complaint  Patient presents with  . Follow-up    HPI Kaitlyn Flowers is a 79 y.o. female.   Patient reports to urgent care today for follow up of yesterdays visit for shortness of breath, nausea, vomiting, dehydration, abnormal labs on BMP and UTI in the context of a COVID 19 diagnosis on 09/09/2019. She reports taking the zofran and keflex last night. The zofran helped with nausea and she was able to keep water and some mashed potatoes down. She was unable to tolerate pedialyte. She states she is neither feeling better or worse. She again states that her Shortness of breath is new within the last 2-3 days. She continues to have a mild non-productive cough. She continues to not have fever or chills.   She continues to endorse fatigue but has not felt faint or light headed. No headache or vision changes.   She states a history of low sodium and previously was just told to decrease water intake.   She had infusion of bamlanivimab on 09/16/2019.      Past Medical History:  Diagnosis Date  . Anemia   . Arthritis   . Back pain   . Cancer ([place])    skin nose  . Depression    situational,due to pain  . Leg weakness   . Osteopenia   . PONV (postoperative nausea and vomiting)   . Right leg pain   . Seizures ([place])    05/13/2019- 4 years ago, "couldnt process" couldnt speak happened 2 times; none since  . 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  . Urinary tract infection   . Wears hearing aid     Patient Active Problem List   Diagnosis Date Noted  . Spondylolisthesis at L5-S1 level 05/17/2019  . Tension-type headache, not intractable 06/11/2017  . Right leg weakness 06/11/2017  . Labile hypertension 09/27/2016  . Autonomic dysfunction 09/27/2016  . Chronic pain syndrome 09/10/2016  . Localization-related idiopathic  epilepsy and epileptic syndromes with seizures of localized onset, not intractable, without status epilepticus ([place]) 08/01/2016  . Hyponatremia 04/01/2016  . Aphasia   . Abdominal cramping 01/22/2016  . Nocturnal leg cramps 12/07/2015  . UTI (lower urinary tract infection) 10/02/2015  . 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 Sacroiliitis ([place]) 06/06/2014  . Lumbar radicular pain 06/06/2014  . Right low back pain 01/07/2014  . Anemia 01/07/2014  . Osteoporosis 12/26/2013  . Glucosuria 12/16/2012  . Tick bite of upper arm 12/16/2012  . UTI (urinary tract infection) 12/16/2012  . Back pain with radiation 06/12/2012  . Plantar wart of right foot 12/29/2011  . 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/11/2011  . Fatigue 11/11/2011  . 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 Urinary frequency 09/02/2011  . Dysuria 09/02/2011  . Medicare annual wellness visit, initial 12/18/2010  . Wears hearing aid 12/18/2010  . Medicare welcome visit 12/18/2010  . Back pain 11/23/2010  . Spinal cord tumor   . ABDOMINAL BRUIT 07/21/2009  . NEOPLASM, SKIN, UNCERTAIN BEHAVIOR [id number]  . HYPERKALEMIA 05/12/2007  . ANEMIA NOS 05/12/2007  . DISORDER, STOMACH FUNCTION NEC 04/07/2007    Past Surgical History:  Procedure Laterality Date  . ABDOMINAL EXPOSURE N/A 05/17/2019   Procedure: ABDOMINAL EXPOSURE;  Surgeon: Angelia Mould, MD;  Location: [facility];  Service: Vascular;  Laterality: N/A;  . ABDOMINAL HYSTERECTOMY  1973   1/2 of ovary left cyst on ovary  . ANTERIOR LUMBAR FUSION N/A 05/17/2019   Procedure: Lumbar five Sacral one  Anterior lumbar interbody fusion;  Surgeon: Kristeen Miss, MD;  Location: [facility];  Service: Neurosurgery;  Laterality: N/A;  . APPENDECTOMY     removed with hysterectomy  . COLONOSCOPY    . LUMBAR WOUND DEBRIDEMENT  12/06/2011   Procedure: LUMBAR WOUND DEBRIDEMENT;  Surgeon: Kristeen Miss, MD;  Location: [facility];  Service:  Neurosurgery;  Laterality: N/A;  Repair of Pseudomeningocele Lumbar wound  . ROTATOR CUFF REPAIR     left   . shoulder rotator culff  Left 2006  . spinal cord tumor  1987   2012 october     OB History   No obstetric history on file.      Home Medications    Prior to Admission medications   Medication Sig Start Date End Date Taking? Authorizing Provider  Calcium Citrate-Vitamin D (CALCIUM + D PO) Take 2 tablets by mouth 2 (two) times daily.    [provider]  cephALEXin (KEFLEX) 500 MG capsule Take 1 capsule (500 mg total) by mouth 2 (two) times daily for 5 days. 09/18/19 09/23/19  Taqwa Deem, Marguerita Beards, PA-C  diazepam (VALIUM) 5 MG tablet Take 1 tablet (5 mg total) by mouth every 6 (six) hours as needed for muscle spasms. 05/19/19   Kristeen Miss, MD  diclofenac sodium (VOLTAREN) 1 % GEL Apply 1 application topically 4 (four) times daily as needed (pain).    [provider]  Estradiol 10 MCG TABS vaginal tablet Place 1 tablet (10 mcg total) vaginally 2 (two) times a week. 04/17/17   Carlyle Basques, MD  Garlic [id number] MG CAPS Take 1,000 mg by mouth daily.    [provider]  lamoTRIgine (LAMICTAL) 25 MG tablet Take 1 tablet (25 mg total) by mouth every evening. 09/08/19   Cameron Sprang, MD  Meth-Hyo-M Barnett Hatter Phos-Ph Sal (URO-MP) 118 MG CAPS Take 118 mg by mouth daily as needed (UTI symptoms).  01/11/16   [provider]  ondansetron (ZOFRAN) 4 MG tablet Take 1 tablet (4 mg total) by mouth every 8 (eight) hours as needed for nausea or vomiting. 09/18/19   Tully Mcinturff, Marguerita Beards, PA-C  potassium chloride SA (KLOR-CON) 20 MEQ tablet Take 1 tablet (20 mEq total) by mouth daily for 7 days. 09/19/19 09/26/19  Esaiah Wanless, Marguerita Beards, PA-C  silodosin (RAPAFLO) 8 MG CAPS capsule Take 8 mg by mouth every evening.  06/05/18   [provider]  traMADol (ULTRAM) 50 MG tablet Take 50 mg by mouth 3 (three) times daily.    [provider]  TURMERIC CURCUMIN PO Take 400 mg by mouth  daily.    [provider]  Vitamin D-Vitamin K (VITAMIN K2-VITAMIN D3 PO) Take 1 capsule by mouth daily.    [provider]    Family History Family History  Problem Relation Age of Onset  . Stroke Mother        2010  . Heart attack Father   . Anesthesia problems Neg Hx     Social History Social History   Tobacco Use  . Smoking status: Former Smoker    Packs/day: 0.25    Years: 2.00    Pack years: 0.50    Types: Cigarettes    Quit date: 09/03/1967    Years since quitting: 52.0  . Smokeless tobacco: Never Used  .  Tobacco comment: quit 48 years   Substance Use Topics  . Alcohol use: Yes    Alcohol/week: 4.0 standard drinks    Types: 4 Glasses of wine per week    Comment: have a glass of wine with dinner   . Drug use: No     Allergies   Ibuprofen-famotidine, Fentanyl, Pregabalin, and Penicillins   Review of Systems Review of Systems  Constitutional: Positive for fatigue. Negative for chills and fever.  HENT: Negative for congestion, ear pain, sinus pressure, sinus pain and sore throat.   Eyes: Negative for pain and visual disturbance.  Respiratory: Positive for cough and shortness of breath.   Cardiovascular: Negative for chest pain and palpitations.  Gastrointestinal: Positive for nausea. Negative for abdominal pain, diarrhea and vomiting.  Genitourinary: Negative for dysuria and hematuria.  Musculoskeletal: Negative for arthralgias, back pain and myalgias.  Skin: Negative for color change and rash.  Neurological: Positive for seizures (history of). Negative for dizziness, syncope, light-headedness and headaches.  All other systems reviewed and are negative.    Physical Exam Triage Vital Signs ED Triage Vitals [09/19/19 1025]  Enc Vitals Group     BP      Pulse      Resp      Temp      Temp src      SpO2      Weight      Height      Head Circumference      Peak Flow      Pain Score 0     Pain Loc      Pain Edu?      Excl. in [place]?      No data found.  Updated Vital Signs BP 139/67 (BP Location: Right Arm)   Pulse 91   Temp 97.8 F (36.6 C) (Oral)   Resp 19   LMP  (LMP Unknown)   SpO2 95%   Visual Acuity Right Eye Distance:   Left Eye Distance:   Bilateral Distance:    Right Eye Near:   Left Eye Near:    Bilateral Near:     Physical Exam Vitals and nursing note reviewed.  Constitutional:      General: She is not in acute distress.    Appearance: She is well-developed. She is ill-appearing. She is not toxic-appearing or diaphoretic.  HENT:     Head: Normocephalic and atraumatic.     Mouth/Throat:     Mouth: Mucous membranes are moist.     Pharynx: Oropharynx is clear.  Eyes:     General: No scleral icterus.    Extraocular Movements: Extraocular movements intact.     Conjunctiva/sclera: Conjunctivae normal.     Pupils: Pupils are equal, round, and reactive to light.  Cardiovascular:     Rate and Rhythm: Normal rate and regular rhythm.     Heart sounds: No murmur. No friction rub. No gallop.   Pulmonary:     Effort: Pulmonary effort is normal. No respiratory distress.     Breath sounds: Normal breath sounds. No wheezing, rhonchi or rales.  Abdominal:     Palpations: Abdomen is soft.     Tenderness: There is no abdominal tenderness.  Musculoskeletal:     Cervical back: Normal range of motion and neck supple.     Right lower leg: No edema.     Left lower leg: No edema.  Skin:    General: Skin is warm and dry.     Capillary Refill: Capillary  refill takes less than 2 seconds.  Neurological:     General: No focal deficit present.     Mental Status: She is alert and oriented to person, place, and time.     Cranial Nerves: No cranial nerve deficit.     Sensory: No sensory deficit.     Motor: No weakness.  Psychiatric:        Mood and Affect: Mood normal.        Behavior: Behavior normal.        Thought Content: Thought content normal.        Judgment: Judgment normal.      UC Treatments /  Results  Labs (all labs ordered are listed, but only abnormal results are displayed) Labs Reviewed  BASIC METABOLIC PANEL - Abnormal; Notable for the following components:      Result Value   Sodium 131 (*)    Potassium 3.2 (*)    Chloride 96 (*)    Glucose, Bld 100 (*)    Calcium 7.3 (*)    All other components within normal limits    EKG   Radiology DG Chest 2 View  Result Date: 09/18/2019 CLINICAL DATA:  79 year old female with positive COVID-19 and increasing shortness of breath EXAM: CHEST - 2 VIEW COMPARISON:  Chest radiograph dated 08/09/2016. FINDINGS: Bilateral predominantly peripheral and subpleural hazy densities, new since the prior radiograph most consistent with multifocal pneumonia, likely viral or atypical in etiology. Clinical correlation and follow-up recommended. There is no pleural effusion or pneumothorax. The cardiac silhouette is within normal is. No acute osseous pathology. Osteopenia with degenerative changes of spine. IMPRESSION: Multifocal pneumonia, likely viral or atypical in etiology. Clinical correlation and follow-up recommended. Electronically Signed   By: Anner Crete M.D.   On: 09/18/2019 18:18    Procedures Procedures (including critical care time)  Medications Ordered in UC Medications - No data to display  Initial Impression / Assessment and Plan / UC Course  I have reviewed the triage vital signs and the nursing notes.  Pertinent labs & imaging results that were available during my care of the patient were reviewed by me and considered in my medical decision making (see chart for details).     #COVID #electrolyte imbalance - Hemodynamically stable, saturating at 95% and not in respiratory distress. Discussed ED precautions for worsening shortness of breath, fever or chest pain.  - Na with improvement 127-131 this AM. Recommending PO hydration with salting food.  - K slightly down, expected post hydration, repletion with 46meq KCL  daily x 7 days - Instructed to follow up with PCP tomorrow for re-evaluation this week.  - Home COVID monitoring ordered yesterday.   Patient agrees and feels confident in plan.  Final Clinical Impressions(s) / UC Diagnoses   Final diagnoses:  COVID-19  Hyponatremia  Hypokalemia     Discharge Instructions     Take the potassium tablets 1 time a day for the next week.  Continue to drink 4-6 bottles of water a day and add salt to your foods. Eat small hearty meals, soups are good as well.   Please call your primary care provider for follow up tomorrow.   Continue previous treatments prescribed  Go directly to the Emergency Department or call 911 if you have severe chest pain, worsening shortness of breath, severe diarrhea or feel as though you might pass out.       ED Prescriptions    Medication Sig Dispense Auth. Provider   potassium chloride SA (  KLOR-CON) 20 MEQ tablet  (Status: Discontinued) Take 1 tablet (20 mEq total) by mouth daily. 1 tablet Anahit Klumb, Marguerita Beards, PA-C   potassium chloride SA (KLOR-CON) 20 MEQ tablet Take 1 tablet (20 mEq total) by mouth daily for 7 days. 7 tablet Anastacia Reinecke, Marguerita Beards, PA-C     PDMP not reviewed this encounter.   Purnell Shoemaker, PA-C 09/19/19 1143

## 2019-09-19 NOTE — Discharge Instructions (Addendum)
Take the potassium tablets 1 time a day for the next week.  Continue to drink 4-6 bottles of water a day and add salt to your foods. Eat small hearty meals, soups are good as well.   Please call your primary care provider for follow up tomorrow.   Continue previous treatments prescribed  Go directly to the Emergency Department or call 911 if you have severe chest pain, worsening shortness of breath, severe diarrhea or feel as though you might pass out.

## 2019-09-20 LAB — URINE CULTURE: Culture: >=100000 — AB

## 2019-09-21 ENCOUNTER — Telehealth (HOSPITAL_COMMUNITY): Payer: Self-pay | Admitting: Emergency Medicine

## 2019-09-21 ENCOUNTER — Other Ambulatory Visit: Payer: Self-pay

## 2019-09-21 ENCOUNTER — Telehealth (INDEPENDENT_AMBULATORY_CARE_PROVIDER_SITE_OTHER): Payer: Medicare PPO | Admitting: Internal Medicine

## 2019-09-21 ENCOUNTER — Ambulatory Visit: Payer: Self-pay | Admitting: *Deleted

## 2019-09-21 ENCOUNTER — Encounter: Payer: Self-pay | Admitting: Internal Medicine

## 2019-09-21 DIAGNOSIS — U071 COVID-19: Secondary | ICD-10-CM | POA: Diagnosis not present

## 2019-09-21 DIAGNOSIS — E871 Hypo-osmolality and hyponatremia: Secondary | ICD-10-CM

## 2019-09-21 DIAGNOSIS — E876 Hypokalemia: Secondary | ICD-10-CM

## 2019-09-21 MED ORDER — POTASSIUM CHLORIDE ER 10 MEQ PO TBCR: 20.0000 meq | EXTENDED_RELEASE_TABLET | Freq: Every day | ORAL | 0 refills | Status: DC

## 2019-09-21 MED ORDER — SULFAMETHOXAZOLE-TRIMETHOPRIM 800-160 MG PO TABS: 1.0000 | ORAL_TABLET | Freq: Two times a day (BID) | ORAL | 0 refills | Status: AC

## 2019-09-21 NOTE — Telephone Encounter (Signed)
Pt was given keflex for treatment, possible resistance. Contacted pt, she states she did not have many symptoms to begin with so unable to really tell if she is feeling better. Reviewed with Roland Rack, will switch to bactrim BID x5 days. Pt verbalized understanding, all questions answered.

## 2019-09-21 NOTE — Telephone Encounter (Signed)
She was diagnosed with COVID-19 on 09/09/2019.   She has since developed a dry cough that keeps her up at night, shortness of breath and extreme fatigue.    See triage notes.  I warm transferred her call to Tammy in Dr. Velora Mediate office to be scheduled.   Pt was agreeable to this and agreeable to doing a video chat.   She's done them before.  I sent my notes to the office.   Reason for Disposition . [1] MILD difficulty breathing (e.g., minimal/no SOB at rest, SOB with walking, pulse <100) AND [2] NEW-onset or WORSE than normal  Answer Assessment - Initial Assessment Questions 1. RESPIRATORY STATUS: "Describe your breathing?" (e.g., wheezing, shortness of breath, unable to speak, severe coughing)      I've been sick.   I had COVID-19.   I'm short of breath and coughing.  No energy. 2. ONSET: "When did this breathing problem begin?"      A  Few days ago I started coughing and having the shortness of breath.   I'm short of breath when I'm up and about.   Going from sitting to standing will cause me to be short of breath.   3. PATTERN "Does the difficult breathing come and go, or has it been constant since it started?"      Yes 4. SEVERITY: "How bad is your breathing?" (e.g., mild, moderate, severe)    - MILD: No SOB at rest, mild SOB with walking, speaks normally in sentences, can lay down, no retractions, pulse < 100.    - MODERATE: SOB at rest, SOB with minimal exertion and prefers to sit, cannot lie down flat, speaks in phrases, mild retractions, audible wheezing, pulse 100-120.    - SEVERE: Very SOB at rest, speaks in single words, struggling to breathe, sitting hunched forward, retractions, pulse > 120      I feel so weak. 5. RECURRENT SYMPTOM: "Have you had difficulty breathing before?" If so, ask: "When was the last time?" and "What happened that time?"      No 6. CARDIAC HISTORY: "Do you have any history of heart disease?" (e.g., heart attack, angina, bypass surgery, angioplasty)       No 7. LUNG HISTORY: "Do you have any history of lung disease?"  (e.g., pulmonary embolus, asthma, emphysema)     No 8. CAUSE: "What do you think is causing the breathing problem?"      I don't know I assumed because I had COVID-19. 9. OTHER SYMPTOMS: "Do you have any other symptoms? (e.g., dizziness, runny nose, cough, chest pain, fever)     No other symptoms except very tired. 10. PREGNANCY: "Is there any chance you are pregnant?" "When was your last menstrual period?"       N/A due to age 79. TRAVEL: "Have you traveled out of the country in the last month?" (e.g., travel history, exposures)       No travels.  Protocols used: BREATHING DIFFICULTY-A-AH

## 2019-09-21 NOTE — Progress Notes (Signed)
Virtual Visit via Video Note  I connected with@ on 09/21/19 at  3:00 PM EST by a video enabled telemedicine application and verified that I am speaking with the correct person using two identifiers. Location patient: home Location provider:work  office Persons participating in the virtual visit: patient, provider  [place] national recommendations  regarding COVID 19 pandemic   video visit is advised over in office visit for this patient.  Patient aware  of the limitations of evaluation and management by telemedicine and  availability of in person appointments. and agreed to proceed.   HPI: Kaitlyn Flowers presents for video visit  Post Ed visit  Jan 17 after dx covid  On Jan 7th and had uti   klebsiella  spc put on kelfex but changed to septra bid for 5 days today  Based on sensitivity  Not sure hwo she is feeling still sick   Not worse  Was unable to take the potassium pill so far  gagging cause so larger   She received the monoclonal aby rx and some help but still cough and sob  Doe  Was seen  both Jan16 and 17  For sx and noted to have hyponatremia and hypokalemia   See notes  Initial pulse ox 98 and then 95  . cxray c/w viral pna.  NO fever  Cough some sob  Doe but not struggling to breath at rest just tired.  Asks about when may get better . Lives alone but has  supports and someone to call .  instr from UC from Jan17 Take the potassium tablets 1 time a day for the next week.  Continue to drink 4-6 bottles of water a day and add salt to your foods. Eat small hearty meals, soups are good as well.   Please call your primary care provider for follow up tomorrow.   ROS: See pertinent positives and negatives per HPI.  Past Medical History:  Diagnosis Date  . Anemia   . Arthritis   . Back pain   . Cancer ([place])    skin nose  . Depression    situational,due to pain  . Leg weakness   . Osteopenia   . PONV (postoperative nausea and vomiting)   . Right leg pain   . Seizures  ([place])    05/13/2019- 4 years ago, "couldnt process" couldnt speak happened 2 times; none since  . 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  . Urinary tract infection   . Wears hearing aid     Past Surgical History:  Procedure Laterality Date  . ABDOMINAL EXPOSURE N/A 05/17/2019   Procedure: ABDOMINAL EXPOSURE;  Surgeon: Angelia Mould, MD;  Location: [facility];  Service: Vascular;  Laterality: N/A;  . ABDOMINAL HYSTERECTOMY  1973   1/2 of ovary left cyst on ovary  . ANTERIOR LUMBAR FUSION N/A 05/17/2019   Procedure: Lumbar five Sacral one  Anterior lumbar interbody fusion;  Surgeon: Kristeen Miss, MD;  Location: [facility];  Service: Neurosurgery;  Laterality: N/A;  . APPENDECTOMY     removed with hysterectomy  . COLONOSCOPY    . LUMBAR WOUND DEBRIDEMENT  12/06/2011   Procedure: LUMBAR WOUND DEBRIDEMENT;  Surgeon: Kristeen Miss, MD;  Location: [facility];  Service: Neurosurgery;  Laterality: N/A;  Repair of Pseudomeningocele Lumbar wound  . ROTATOR CUFF REPAIR     left   . shoulder rotator culff  Left 2006  . spinal cord tumor  1987  2012 october     Family History  Problem Relation Age of Onset  . Stroke Mother        2010  . Heart attack Father   . Anesthesia problems Neg Hx     Social History   Tobacco Use  . Smoking status: Former Smoker    Packs/day: 0.25    Years: 2.00    Pack years: 0.50    Types: Cigarettes    Quit date: 09/03/1967    Years since quitting: 52.0  . Smokeless tobacco: Never Used  . Tobacco comment: quit 48 years   Substance Use Topics  . Alcohol use: Yes    Alcohol/week: 4.0 standard drinks    Types: 4 Glasses of wine per week    Comment: have a glass of wine with dinner   . Drug use: No      Current Outpatient Medications:  .  Calcium Citrate-Vitamin D (CALCIUM + D PO), Take 2 tablets by mouth 2 (two) times daily., Disp: , Rfl:  .  cephALEXin (KEFLEX) 500 MG capsule, Take 1 capsule (500 mg total)  by mouth 2 (two) times daily for 5 days., Disp: 10 capsule, Rfl: 0 .  diazepam (VALIUM) 5 MG tablet, Take 1 tablet (5 mg total) by mouth every 6 (six) hours as needed for muscle spasms., Disp: 30 tablet, Rfl: 0 .  diclofenac sodium (VOLTAREN) 1 % GEL, Apply 1 application topically 4 (four) times daily as needed (pain)., Disp: , Rfl:  .  Estradiol 10 MCG TABS vaginal tablet, Place 1 tablet (10 mcg total) vaginally 2 (two) times a week., Disp: 8 tablet, Rfl: 11 .  Garlic 123XX123 MG CAPS, Take 1,000 mg by mouth daily., Disp: , Rfl:  .  lamoTRIgine (LAMICTAL) 25 MG tablet, Take 1 tablet (25 mg total) by mouth every evening., Disp: 90 tablet, Rfl: 3 .  Meth-Hyo-M Bl-Na Phos-Ph Sal (URO-MP) 118 MG CAPS, Take 118 mg by mouth daily as needed (UTI symptoms). , Disp: , Rfl: 3 .  ondansetron (ZOFRAN) 4 MG tablet, Take 1 tablet (4 mg total) by mouth every 8 (eight) hours as needed for nausea or vomiting., Disp: 4 tablet, Rfl: 0 .  potassium chloride (KLOR-CON 10) 10 MEQ tablet, Take 2 tablets (20 mEq total) by mouth daily. For 7 days, Disp: 14 tablet, Rfl: 0 .  potassium chloride SA (KLOR-CON) 20 MEQ tablet, Take 1 tablet (20 mEq total) by mouth daily for 7 days., Disp: 7 tablet, Rfl: 0 .  silodosin (RAPAFLO) 8 MG CAPS capsule, Take 8 mg by mouth every evening. , Disp: , Rfl: 0 .  sulfamethoxazole-trimethoprim (BACTRIM DS) 800-160 MG tablet, Take 1 tablet by mouth 2 (two) times daily for 5 days., Disp: 10 tablet, Rfl: 0 .  traMADol (ULTRAM) 50 MG tablet, Take 50 mg by mouth 3 (three) times daily., Disp: , Rfl:  .  TURMERIC CURCUMIN PO, Take 400 mg by mouth daily., Disp: , Rfl:  .  Vitamin D-Vitamin K (VITAMIN K2-VITAMIN D3 PO), Take 1 capsule by mouth daily., Disp: , Rfl:   EXAM: BP Readings from Last 3 Encounters:  09/19/19 139/67  09/18/19 131/61  09/16/19 129/73    VITALS per patient if applicable:  GENERAL: alert, oriented, appears well and in no acute distress  ocass cough but no   resp distress looks   Mildly ill   HEENT: atraumatic, conjunttiva clear, no obvious abnormalities on inspection of external nose and ears  NECK: normal movements of the head and neck  LUNGS: on inspection no signs of respiratory distress, breathing rate appears normal, no obvious gross SOB, gasping or wheezing  CV: no obvious cyanosis PSYCH/NEURO: pleasant and cooperative, no obvious depression or anxiety, speech and thought processing grossly intact Lab Results  Component Value Date   WBC 10.1 09/18/2019   HGB 11.5 (L) 09/18/2019   HCT 33.2 (L) 09/18/2019   PLT 228 09/18/2019   GLUCOSE 100 (H) 09/19/2019   CHOL 241 (H) 04/02/2016   TRIG 78.0 04/02/2016   HDL 111.30 04/02/2016   LDLDIRECT 110.1 11/27/2010   LDLCALC 114 (H) 04/02/2016   ALT 14 07/22/2018   AST 23 07/22/2018   NA 131 (L) 09/19/2019   K 3.2 (L) 09/19/2019   CL 96 (L) 09/19/2019   CREATININE 0.73 09/19/2019   BUN 10 09/19/2019   CO2 22 09/19/2019   TSH 0.83 09/26/2017   INR 1.01 03/25/2016    ASSESSMENT AND PLAN:  Discussed the following assessment and plan:    ICD-10-CM   1. COVID-19  U07.1   2. Hyponatremia  123456 Basic metabolic panel  3. Hypokalemia  [id number] Basic metabolic panel   Disc   Seek emergent care if sob gets worse but seems to be stable at this time  Will send in 10 me potassium cause not  Able to take swallow  larger  20 meq pill.  antibiotic now for  uti  Sensitive  Organism  Please give [place] update of  How doing Friday am or as needed  Will need to  Figure out how  to get   Fu labs done  She is day 13 out from  [facility] lab test. will place future orders at [place].   Expectant management and discussion of plan and treatment with opportunity to ask questions and all were answered. The patient agreed with the plan and demonstrated an understanding of the instructions.   Advised to call back or seek an in-person evaluation if worsening  or having  further concerns . Return for status report on  friday and then plan fu   arrange fu lab to be done .  Outside external source  DATA REVIEWED:   2 prev ed uc notes and labs and xray  And culture   Total time on date  of service including record review ordering and plan of care:   35 minutes    Shanon Ace, MD

## 2019-09-21 NOTE — Telephone Encounter (Signed)
fyi

## 2019-09-22 ENCOUNTER — Telehealth: Payer: Self-pay

## 2019-09-22 ENCOUNTER — Telehealth: Payer: Self-pay | Admitting: *Deleted

## 2019-09-22 MED ORDER — ONDANSETRON HCL 4 MG PO TABS: 4.0000 mg | ORAL_TABLET | Freq: Three times a day (TID) | ORAL | 0 refills | Status: DC | PRN

## 2019-09-22 NOTE — Telephone Encounter (Signed)
Please advise 

## 2019-09-22 NOTE — Telephone Encounter (Signed)
Ask if  She wants [place] to refill her zofran to take before she takes the antibiotic   If so can send in   # 14 zofran

## 2019-09-22 NOTE — Telephone Encounter (Signed)
Copied from [place] [id number]. Topic: General - Inquiry >> Sep 22, 2019  3:46 PM Virl Axe D wrote: Reason for CRM: Pt stated the sulfa for her UTI is making her nauseous. She wanted Dr. Regis Bill to be aware.

## 2019-09-22 NOTE — Addendum Note (Signed)
Addended by: Modena Morrow R on: 09/22/2019 04:32 PM   Modules accepted: Orders

## 2019-09-22 NOTE — Telephone Encounter (Signed)
Pt states she is having burning and stinging. Pt states she just started taking the right antibiotic yesterday. And  is wondering if there is anything we can send in for the discomfort until antibiotics start working

## 2019-09-22 NOTE — Telephone Encounter (Signed)
Copied from [place] [id number]. Topic: General - Other >> Sep 22, 2019 12:41 PM Alanda Slim E wrote: Reason for CRM: Pt wants to speak with nurse about her UTI/ Pt is still having symptoms/ please advise

## 2019-09-22 NOTE — Telephone Encounter (Signed)
Pt has been told info below and medication sent in

## 2019-09-23 ENCOUNTER — Other Ambulatory Visit: Payer: Self-pay

## 2019-09-23 ENCOUNTER — Other Ambulatory Visit: Payer: Self-pay | Admitting: Neurological Surgery

## 2019-09-23 DIAGNOSIS — M21371 Foot drop, right foot: Secondary | ICD-10-CM

## 2019-09-23 MED ORDER — NITROFURANTOIN MONOHYD MACRO 100 MG PO CAPS: 100.0000 mg | ORAL_CAPSULE | Freq: Two times a day (BID) | ORAL | 0 refills | Status: DC

## 2019-09-23 NOTE — Telephone Encounter (Signed)
Contact her for more information   If she thinks the  Medication is doing it  Then we can try macrobid 100 mg 1 pobid for 5 days  Disp 10 but assess how her breathing is doing   She needs to get her bmp repeated next week and I place th order in thesystem for [facility].

## 2019-09-23 NOTE — Telephone Encounter (Signed)
Please advise 

## 2019-09-23 NOTE — Telephone Encounter (Signed)
Pt states that her breathing is better and that nauseous only after taking antibiotic macrobid has been sent in

## 2019-09-23 NOTE — Telephone Encounter (Signed)
Patient called in stating she is still having UTI symptoms as well as still feeling very nauseous. Pt states zofran is not helping. Please advise.

## 2019-09-28 ENCOUNTER — Ambulatory Visit (INDEPENDENT_AMBULATORY_CARE_PROVIDER_SITE_OTHER): Payer: Medicare PPO | Admitting: Internal Medicine

## 2019-09-28 ENCOUNTER — Encounter: Payer: Self-pay | Admitting: Internal Medicine

## 2019-09-28 ENCOUNTER — Other Ambulatory Visit: Payer: Self-pay

## 2019-09-28 DIAGNOSIS — E876 Hypokalemia: Secondary | ICD-10-CM

## 2019-09-28 DIAGNOSIS — R3989 Other symptoms and signs involving the genitourinary system: Secondary | ICD-10-CM

## 2019-09-28 DIAGNOSIS — R399 Unspecified symptoms and signs involving the genitourinary system: Secondary | ICD-10-CM | POA: Diagnosis not present

## 2019-09-28 DIAGNOSIS — E871 Hypo-osmolality and hyponatremia: Secondary | ICD-10-CM

## 2019-09-28 DIAGNOSIS — U071 COVID-19: Secondary | ICD-10-CM

## 2019-09-28 NOTE — Progress Notes (Signed)
Virtual Visit via Video Note  I connected with@ on 09/28/19 at  1:30 PM EST by a video enabled telemedicine application and verified that I am speaking with the correct person using two identifiers. Location patient: home Location provider:work  office Persons participating in the virtual visit: patient, provider  [place] national recommendations  regarding COVID 19 pandemic   video visit is advised over in office visit for this patient.  Patient aware  of the limitations of evaluation and management by telemedicine and  availability of in person appointments. and agreed to proceed.   HPI: Kaitlyn Flowers presents for video visit Covid sx a lot better  Cough about gone and no  Sob feels much better  Just  lingering fatigue  She is 19+ days out from covid  And a lot better   UTI : see notes  Had septra  And nausea and changed to [employer] but still has  Burning feeling all the time localized and some dysuria  No blood     She had recently gone back to straight cath  Because of retention      Seen by Dr Karsten Ro  about 6 weeks ago.  Has fu next week.    ROS: See pertinent positives and negatives per HPI. No fever cough sob  Hematuria   Past Medical History:  Diagnosis Date  . Anemia   . Arthritis   . Back pain   . Cancer ([place])    skin nose  . Depression    situational,due to pain  . Leg weakness   . Osteopenia   . PONV (postoperative nausea and vomiting)   . Right leg pain   . Seizures ([place])    05/13/2019- 4 years ago, "couldnt process" couldnt speak happened 2 times; none since  . 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  . Urinary tract infection   . Wears hearing aid     Past Surgical History:  Procedure Laterality Date  . ABDOMINAL EXPOSURE N/A 05/17/2019   Procedure: ABDOMINAL EXPOSURE;  Surgeon: Angelia Mould, MD;  Location: [place];  Service: Vascular;  Laterality: N/A;  . ABDOMINAL HYSTERECTOMY  1973   1/2  of ovary left cyst on ovary  . ANTERIOR LUMBAR FUSION N/A 05/17/2019   Procedure: Lumbar five Sacral one  Anterior lumbar interbody fusion;  Surgeon: Kristeen Miss, MD;  Location: [place];  Service: Neurosurgery;  Laterality: N/A;  . APPENDECTOMY     removed with hysterectomy  . COLONOSCOPY    . LUMBAR WOUND DEBRIDEMENT  12/06/2011   Procedure: LUMBAR WOUND DEBRIDEMENT;  Surgeon: Kristeen Miss, MD;  Location: [place] [facility];  Service: Neurosurgery;  Laterality: N/A;  Repair of Pseudomeningocele Lumbar wound  . ROTATOR CUFF REPAIR     left   . shoulder rotator culff  Left 2006  . spinal cord tumor  1987   2012 october     Family History  Problem Relation Age of Onset  . Stroke Mother        2010  . Heart attack Father   . Anesthesia problems Neg Hx     Social History   Tobacco Use  . Smoking status: Former Smoker    Packs/day: 0.25    Years: 2.00    Pack years: 0.50    Types: Cigarettes    Quit date: 09/03/1967    Years since quitting: 52.1  . Smokeless tobacco: Never Used  . Tobacco comment: quit  48 years   Substance Use Topics  . Alcohol use: Yes    Alcohol/week: 4.0 standard drinks    Types: 4 Glasses of wine per week    Comment: have a glass of wine with dinner   . Drug use: No      Current Outpatient Medications:  .  Calcium Citrate-Vitamin D (CALCIUM + D PO), Take 2 tablets by mouth 2 (two) times daily., Disp: , Rfl:  .  diazepam (VALIUM) 5 MG tablet, Take 1 tablet (5 mg total) by mouth every 6 (six) hours as needed for muscle spasms., Disp: 30 tablet, Rfl: 0 .  diclofenac sodium (VOLTAREN) 1 % GEL, Apply 1 application topically 4 (four) times daily as needed (pain)., Disp: , Rfl:  .  Estradiol 10 MCG TABS vaginal tablet, Place 1 tablet (10 mcg total) vaginally 2 (two) times a week., Disp: 8 tablet, Rfl: 11 .  Garlic 123XX123 MG CAPS, Take 1,000 mg by mouth daily., Disp: , Rfl:  .  lamoTRIgine (LAMICTAL) 25 MG tablet, Take 1 tablet (25 mg total) by mouth every evening.,  Disp: 90 tablet, Rfl: 3 .  Meth-Hyo-M Bl-Na Phos-Ph Sal (URO-MP) 118 MG CAPS, Take 118 mg by mouth daily as needed (UTI symptoms). , Disp: , Rfl: 3 .  nitrofurantoin, macrocrystal-monohydrate, (MACROBID) 100 MG capsule, Take 1 capsule (100 mg total) by mouth 2 (two) times daily., Disp: 10 capsule, Rfl: 0 .  ondansetron (ZOFRAN) 4 MG tablet, Take 1 tablet (4 mg total) by mouth every 8 (eight) hours as needed for nausea or vomiting., Disp: 14 tablet, Rfl: 0 .  potassium chloride (KLOR-CON 10) 10 MEQ tablet, Take 2 tablets (20 mEq total) by mouth daily. For 7 days, Disp: 14 tablet, Rfl: 0 .  potassium chloride SA (KLOR-CON) 20 MEQ tablet, Take 1 tablet (20 mEq total) by mouth daily for 7 days., Disp: 7 tablet, Rfl: 0 .  silodosin (RAPAFLO) 8 MG CAPS capsule, Take 8 mg by mouth every evening. , Disp: , Rfl: 0 .  traMADol (ULTRAM) 50 MG tablet, Take 50 mg by mouth 3 (three) times daily., Disp: , Rfl:  .  TURMERIC CURCUMIN PO, Take 400 mg by mouth daily., Disp: , Rfl:  .  Vitamin D-Vitamin K (VITAMIN K2-VITAMIN D3 PO), Take 1 capsule by mouth daily., Disp: , Rfl:   EXAM: BP Readings from Last 3 Encounters:  09/19/19 139/67  09/18/19 131/61  09/16/19 129/73    VITALS per patient if applicable: well appearing and non toxic   No cough  During visit   GENERAL: alert, oriented, appears well and in no acute distress HEENT: atraumatic, conjunttiva clear, no obvious abnormalities on inspection of external nose and ears NECK: normal movements of the head and neck LUNGS: on inspection no signs of respiratory distress, breathing rate appears normal, no obvious gross SOB, gasping or wheezing CV: no obvious cyanosi  PSYCH/NEURO: pleasant and cooperative, no obvious depression or anxiety, speech and thought processing grossly intact Lab Results  Component Value Date   WBC 10.1 09/18/2019   HGB 11.5 (L) 09/18/2019   HCT 33.2 (L) 09/18/2019   PLT 228 09/18/2019   GLUCOSE 100 (H) 09/19/2019   CHOL 241 (H)  04/02/2016   TRIG 78.0 04/02/2016   HDL 111.30 04/02/2016   LDLDIRECT 110.1 11/27/2010   LDLCALC 114 (H) 04/02/2016   ALT 14 07/22/2018   AST 23 07/22/2018   NA 131 (L) 09/19/2019   K 3.2 (L) 09/19/2019   CL 96 (L) 09/19/2019  CREATININE 0.73 09/19/2019   BUN 10 09/19/2019   CO2 22 09/19/2019   TSH 0.83 09/26/2017   INR 1.01 03/25/2016    ASSESSMENT AND PLAN:  Discussed the following assessment and plan:    ICD-10-CM   1. Suspected UTI partly rx   R39.89 Urinalysis, Routine w reflex microscopic    Urine Culture   clinically only partly resonded to antibiotic  2. COVID-19 convalescent  19+ days out and better   U07.1    ok to be out isolation but precautions  3. Hyponatremia  E87.1 Urinalysis, Routine w reflex microscopic    Urine Culture    Basic metabolic panel    Magnesium  4. Lower urinary tract symptoms (LUTS)  R39.9 Urinalysis, Routine w reflex microscopic    Urine Culture   on self cath at this time  5. Low blood potassium  E87.6 Magnesium   Plan urine cath and re culture and lab work at [facility]   Expectant management and discussion of plan and treatment with opportunity to ask questions and all were answered. The patient agreed with the plan and demonstrated an understanding of the instructions.   Advised to call back or seek an in-person evaluation if worsening  or having  further concerns . Return for get labs and urine  and contact dr Valda Favia team about next step for dysuria .  Outside external source  DATA REVIEWED:   Total time on date  of service including record review ordering and plan of care:  32 minutes     Shanon Ace, MD

## 2019-09-29 ENCOUNTER — Other Ambulatory Visit (INDEPENDENT_AMBULATORY_CARE_PROVIDER_SITE_OTHER): Payer: Medicare PPO

## 2019-09-29 ENCOUNTER — Telehealth: Payer: Self-pay | Admitting: Internal Medicine

## 2019-09-29 DIAGNOSIS — E876 Hypokalemia: Secondary | ICD-10-CM | POA: Diagnosis not present

## 2019-09-29 DIAGNOSIS — R399 Unspecified symptoms and signs involving the genitourinary system: Secondary | ICD-10-CM | POA: Diagnosis not present

## 2019-09-29 DIAGNOSIS — R3989 Other symptoms and signs involving the genitourinary system: Secondary | ICD-10-CM

## 2019-09-29 DIAGNOSIS — E871 Hypo-osmolality and hyponatremia: Secondary | ICD-10-CM

## 2019-09-29 LAB — URINALYSIS, ROUTINE W REFLEX MICROSCOPIC
Bilirubin Urine: NEGATIVE
Hgb urine dipstick: NEGATIVE
Ketones, ur: NEGATIVE
Leukocytes,Ua: NEGATIVE
Nitrite: NEGATIVE
RBC / HPF: NONE SEEN (ref 0–?)
Specific Gravity, Urine: <=1.005 — AB (ref 1.000–1.030)
Total Protein, Urine: NEGATIVE
Urine Glucose: NEGATIVE
Urobilinogen, UA: 0.2 (ref 0.0–1.0)
pH: 6 (ref 5.0–8.0)

## 2019-09-29 LAB — BASIC METABOLIC PANEL
BUN: 17 mg/dL (ref 6–23)
CO2: 28 mEq/L (ref 19–32)
Calcium: 9.4 mg/dL (ref 8.4–10.5)
Chloride: 98 mEq/L (ref 96–112)
Creatinine, Ser: 0.81 mg/dL (ref 0.40–1.20)
GFR: 68.48 mL/min (ref >60.00)
Glucose, Bld: 103 mg/dL — ABNORMAL HIGH (ref 70–99)
Potassium: 4.4 mEq/L (ref 3.5–5.1)
Sodium: 136 mEq/L (ref 135–145)

## 2019-09-29 LAB — MAGNESIUM: Magnesium: 1.8 mg/dL (ref 1.5–2.5)

## 2019-09-29 NOTE — Telephone Encounter (Signed)
Per pt, she would like her urinalysis results faxed to [facility] Fax [phone]. She stated Dr. Regis Bill, and she discusses that they would be sent over when they come back.  Pt would like a call when this is complete; please call  [phone]

## 2019-09-29 NOTE — Telephone Encounter (Signed)
Waiting for this to be put into system

## 2019-09-29 NOTE — Telephone Encounter (Signed)
Please advise 

## 2019-09-29 NOTE — Progress Notes (Signed)
Blood and urine results are good  UA is clear  await culture but no obvious infection. I advise FU with dr Karsten Ro as we discussed

## 2019-09-29 NOTE — Telephone Encounter (Signed)
Pt was seen by [facility] on 09/28/19 and was referred to a [employer]. He has not received that referral because they do not have acces to the messaging system that was used. His fax number [phone]  He would like the referral and notes faxed to him at the number provided.  Pt was like to be contacted once this has been done at [phone]

## 2019-09-30 LAB — URINE CULTURE
MICRO NUMBER:: 10086606
SPECIMEN QUALITY:: ADEQUATE

## 2019-10-01 ENCOUNTER — Telehealth: Payer: Self-pay | Admitting: Internal Medicine

## 2019-10-01 NOTE — Telephone Encounter (Signed)
This has been faxed.

## 2019-10-01 NOTE — Telephone Encounter (Signed)
Pt would like a call from [place] to discuss the results of her recent urine test. Pt would also like to know if she can continue treatment through [facility] because her Urologist is unavailable. Pt can be contact at [phone]

## 2019-10-01 NOTE — Progress Notes (Signed)
No  predominant bacteria    few bacteria but wasn't evaluated .  If this was a cath specimen  may still be important I am sending this  infor  to dr Karsten Ro team  and have then five opinion

## 2019-10-01 NOTE — Telephone Encounter (Signed)
Spoke to patient test results will be sent over to her urlogist no treatment needed at this time

## 2019-10-05 DIAGNOSIS — N8110 Cystocele, unspecified: Secondary | ICD-10-CM | POA: Diagnosis not present

## 2019-10-05 DIAGNOSIS — M21371 Foot drop, right foot: Secondary | ICD-10-CM | POA: Diagnosis not present

## 2019-10-05 DIAGNOSIS — M6281 Muscle weakness (generalized): Secondary | ICD-10-CM | POA: Diagnosis not present

## 2019-10-05 DIAGNOSIS — M4326 Fusion of spine, lumbar region: Secondary | ICD-10-CM | POA: Diagnosis not present

## 2019-10-05 DIAGNOSIS — N3020 Other chronic cystitis without hematuria: Secondary | ICD-10-CM | POA: Diagnosis not present

## 2019-10-06 DIAGNOSIS — M4326 Fusion of spine, lumbar region: Secondary | ICD-10-CM | POA: Diagnosis not present

## 2019-10-06 DIAGNOSIS — M21371 Foot drop, right foot: Secondary | ICD-10-CM | POA: Diagnosis not present

## 2019-10-06 DIAGNOSIS — M6281 Muscle weakness (generalized): Secondary | ICD-10-CM | POA: Diagnosis not present

## 2019-10-11 ENCOUNTER — Ambulatory Visit
Admission: RE | Admit: 2019-10-11 | Discharge: 2019-10-11 | Disposition: A | Discharge status: Home / Self Care | Payer: Medicare PPO | Source: Ambulatory Visit | Attending: Neurological Surgery | Admitting: Neurological Surgery

## 2019-10-11 ENCOUNTER — Other Ambulatory Visit: Payer: Self-pay

## 2019-10-11 DIAGNOSIS — M545 Low back pain: Secondary | ICD-10-CM | POA: Diagnosis not present

## 2019-10-11 DIAGNOSIS — M21371 Foot drop, right foot: Secondary | ICD-10-CM

## 2019-10-13 DIAGNOSIS — M6281 Muscle weakness (generalized): Secondary | ICD-10-CM | POA: Diagnosis not present

## 2019-10-13 DIAGNOSIS — M5416 Radiculopathy, lumbar region: Secondary | ICD-10-CM | POA: Diagnosis not present

## 2019-10-13 DIAGNOSIS — M21371 Foot drop, right foot: Secondary | ICD-10-CM | POA: Diagnosis not present

## 2019-10-13 DIAGNOSIS — M4326 Fusion of spine, lumbar region: Secondary | ICD-10-CM | POA: Diagnosis not present

## 2019-10-15 DIAGNOSIS — M6281 Muscle weakness (generalized): Secondary | ICD-10-CM | POA: Diagnosis not present

## 2019-10-15 DIAGNOSIS — N8110 Cystocele, unspecified: Secondary | ICD-10-CM | POA: Diagnosis not present

## 2019-10-15 DIAGNOSIS — M21371 Foot drop, right foot: Secondary | ICD-10-CM | POA: Diagnosis not present

## 2019-10-15 DIAGNOSIS — R3914 Feeling of incomplete bladder emptying: Secondary | ICD-10-CM | POA: Diagnosis not present

## 2019-10-15 DIAGNOSIS — M4326 Fusion of spine, lumbar region: Secondary | ICD-10-CM | POA: Diagnosis not present

## 2019-10-19 ENCOUNTER — Telehealth: Payer: Self-pay | Admitting: Internal Medicine

## 2019-10-19 DIAGNOSIS — M4326 Fusion of spine, lumbar region: Secondary | ICD-10-CM | POA: Diagnosis not present

## 2019-10-19 DIAGNOSIS — M21371 Foot drop, right foot: Secondary | ICD-10-CM | POA: Diagnosis not present

## 2019-10-19 DIAGNOSIS — M6281 Muscle weakness (generalized): Secondary | ICD-10-CM | POA: Diagnosis not present

## 2019-10-19 NOTE — Telephone Encounter (Signed)
Pt submitted to prolia portal awaiting SOB

## 2019-10-27 DIAGNOSIS — M6281 Muscle weakness (generalized): Secondary | ICD-10-CM | POA: Diagnosis not present

## 2019-10-27 DIAGNOSIS — M4326 Fusion of spine, lumbar region: Secondary | ICD-10-CM | POA: Diagnosis not present

## 2019-10-27 DIAGNOSIS — M21371 Foot drop, right foot: Secondary | ICD-10-CM | POA: Diagnosis not present

## 2019-10-28 DIAGNOSIS — Z8739 Personal history of other diseases of the musculoskeletal system and connective tissue: Secondary | ICD-10-CM | POA: Diagnosis not present

## 2019-10-28 DIAGNOSIS — M5136 Other intervertebral disc degeneration, lumbar region: Secondary | ICD-10-CM | POA: Diagnosis not present

## 2019-10-29 DIAGNOSIS — M6281 Muscle weakness (generalized): Secondary | ICD-10-CM | POA: Diagnosis not present

## 2019-10-29 DIAGNOSIS — M4326 Fusion of spine, lumbar region: Secondary | ICD-10-CM | POA: Diagnosis not present

## 2019-10-29 DIAGNOSIS — M21371 Foot drop, right foot: Secondary | ICD-10-CM | POA: Diagnosis not present

## 2019-11-02 DIAGNOSIS — M6281 Muscle weakness (generalized): Secondary | ICD-10-CM | POA: Diagnosis not present

## 2019-11-02 DIAGNOSIS — M21371 Foot drop, right foot: Secondary | ICD-10-CM | POA: Diagnosis not present

## 2019-11-02 DIAGNOSIS — M4326 Fusion of spine, lumbar region: Secondary | ICD-10-CM | POA: Diagnosis not present

## 2019-11-04 DIAGNOSIS — L814 Other melanin hyperpigmentation: Secondary | ICD-10-CM | POA: Diagnosis not present

## 2019-11-04 DIAGNOSIS — L308 Other specified dermatitis: Secondary | ICD-10-CM | POA: Diagnosis not present

## 2019-11-04 DIAGNOSIS — L821 Other seborrheic keratosis: Secondary | ICD-10-CM | POA: Diagnosis not present

## 2019-11-04 DIAGNOSIS — Z85828 Personal history of other malignant neoplasm of skin: Secondary | ICD-10-CM | POA: Diagnosis not present

## 2019-11-08 ENCOUNTER — Telehealth: Payer: Self-pay | Admitting: Internal Medicine

## 2019-11-08 NOTE — Telephone Encounter (Signed)
Pt had covid in January. She heard that she can't receive the vaccine until after 90 days of having it. Pt would like to know about her immune status-when can she receive it?   Pt also would like to know when is she available to take the [place] vaccine? And if it will effect getting the covid vaccine?  Pt can be reached at  [phone]

## 2019-11-08 NOTE — Telephone Encounter (Signed)
Patient notified of update  and verbalized understanding. 

## 2019-11-08 NOTE — Telephone Encounter (Signed)
Patient is calling asking about rescheduling Prolia shot. Patient would like to be contacted at ph# [phone].

## 2019-11-08 NOTE — Telephone Encounter (Signed)
If you had the infusion ( antibodies)  You should wait  90 days from then  Because that will interfere with vaccine efficacy Would wait  90 days for either vaccine.  Otherwise   No need to wait  But no rush since you probably have some immunity  Ok to get shingles vaccine  Also  But many people prefer to separate 2 weeks to delineate  side effects  Possible.

## 2019-11-08 NOTE — Telephone Encounter (Signed)
Please advise 

## 2019-11-09 NOTE — Telephone Encounter (Signed)
She will have a $40 copay, needs a PA. Awaiting completion of that.

## 2019-11-12 NOTE — Telephone Encounter (Signed)
Received notification from [facility] that PA is required for Prolia injection. Called insurance and initiated PA and received key code for [email or web address] in order to complete PA, as this is the fastest and preferred way of completing PA.  Key is: [id number]  Ref #: [id number]   Will complete PA. And document accordingly.

## 2019-11-12 NOTE — Telephone Encounter (Signed)
Key is not working through [email or web address]. Calling pt's insurance to find out why.

## 2019-11-16 DIAGNOSIS — R3914 Feeling of incomplete bladder emptying: Secondary | ICD-10-CM | POA: Diagnosis not present

## 2019-11-16 DIAGNOSIS — N312 Flaccid neuropathic bladder, not elsewhere classified: Secondary | ICD-10-CM | POA: Diagnosis not present

## 2019-11-19 NOTE — Telephone Encounter (Signed)
Pt has been approved for Prolia, $40 admin fee  LMTCB to get pt scheduled. She is due for this shot now

## 2019-11-30 ENCOUNTER — Other Ambulatory Visit: Payer: Self-pay

## 2019-11-30 ENCOUNTER — Ambulatory Visit: Payer: Medicare PPO

## 2019-11-30 DIAGNOSIS — M810 Age-related osteoporosis without current pathological fracture: Secondary | ICD-10-CM

## 2019-11-30 MED ORDER — DENOSUMAB 60 MG/ML ~~LOC~~ SOSY
60.0000 mg | PREFILLED_SYRINGE | Freq: Once | SUBCUTANEOUS | Status: AC
  Administered 2019-11-30: 60 mg via SUBCUTANEOUS

## 2019-11-30 NOTE — Progress Notes (Addendum)
Per Dr Cruzita Lederer prolia injection given to patient.  Patient tolerated injection well.    I supervised the injection. Philemon Kingdom, MD PhD [facility]

## 2019-12-08 DIAGNOSIS — N8110 Cystocele, unspecified: Secondary | ICD-10-CM | POA: Diagnosis not present

## 2019-12-08 DIAGNOSIS — R3914 Feeling of incomplete bladder emptying: Secondary | ICD-10-CM | POA: Diagnosis not present

## 2019-12-08 DIAGNOSIS — N312 Flaccid neuropathic bladder, not elsewhere classified: Secondary | ICD-10-CM | POA: Diagnosis not present

## 2019-12-10 NOTE — Progress Notes (Signed)
This visit occurred during the SARS-CoV-2 public health emergency.  Safety protocols were in place, including screening questions prior to the visit, additional usage of staff PPE, and extensive cleaning of exam room while observing appropriate contact time as indicated for disinfecting solutions.    Chief Complaint  Patient presents with  . Hypertension    Has had some elevated BP readings, has had some headaches in the morning and some pulsing in head in the evening    HPI: Kaitlyn Flowers 79 y.o. come in for   Concern  About BP140/80    When getting some AM  Some headaches throbbing and awakenings.   Now better  After taking allegra   Seems to be better   had  Groggy and sleepy.   Not sure if connected.   [facility].  Runny nose  3 x  . Has woken   Sweaty and top headaches.    Hx of covid 21 Sep 2019  Mostly Gi sx .  Now 90 days and to get vaccine. Right foot drop with afo when driving. Under eval for urine cath ok at night but in day not able to cath standing  To get a pessary as trial   Limits fluids sometimes cause of this Feels pretty good other wise  ROS: See pertinent positives and negatives per HPI. Back and rle predicament seems to be remote se of earlier radiation  To back  Past Medical History:  Diagnosis Date  . Anemia   . Arthritis   . Back pain   . Cancer ([place])    skin nose  . Depression    situational,due to pain  . Leg weakness   . Osteopenia   . PONV (postoperative nausea and vomiting)   . Right leg pain   . Seizures ([place])    05/13/2019- 4 years ago, "couldnt process" couldnt speak happened 2 times; none since  . 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  . Urinary tract infection   . Wears hearing aid     Family History  Problem Relation Age of Onset  . Stroke Mother        2010  . Heart attack Father   . Anesthesia problems Neg Hx     Social History   Socioeconomic History  . Marital  status: Widowed    Spouse name: Not on file  . Number of children: Not on file  . Years of education: Not on file  . Highest education level: Not on file  Occupational History  . Not on file  Tobacco Use  . Smoking status: Former Smoker    Packs/day: 0.25    Years: 2.00    Pack years: 0.50    Types: Cigarettes    Quit date: 09/03/1967    Years since quitting: 52.3  . Smokeless tobacco: Never Used  . Tobacco comment: quit 48 years   Substance and Sexual Activity  . Alcohol use: Yes    Alcohol/week: 4.0 standard drinks    Types: 4 Glasses of wine per week    Comment: have a glass of wine with dinner   . Drug use: No  . Sexual activity: Not on file  Other Topics Concern  . 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 Difficulty of Paying Living Expenses:   Food Insecurity:   . Worried About Charity fundraiser in the Last Year:   . Arboriculturist in the Last Year:   Transportation Needs:   . Film/video editor (Medical):   Marland Kitchen Lack of Transportation (Non-Medical):   Physical Activity:   . Days of Exercise per Week:   . Minutes of Exercise per Session:   Stress:   . Feeling of Stress :   Social Connections:   . Frequency of Communication with Friends and Family:   . Frequency of Social Gatherings with Friends and Family:   . Attends Religious Services:   . Active Member of Clubs or Organizations:   . Attends Archivist Meetings:   Marland Kitchen Marital Status:     Outpatient Medications Prior to Visit  Medication Sig Dispense Refill  . Calcium Citrate-Vitamin D (CALCIUM + D PO) Take 2 tablets by mouth 2 (two) times daily.    . diclofenac sodium (VOLTAREN) 1 % GEL Apply 1 application topically 4 (four) times daily as needed (pain).    . Estradiol 10  MCG TABS vaginal tablet Place 1 tablet (10 mcg total) vaginally 2 (two) times a week. 8 tablet 11  . Garlic 123XX123 MG CAPS Take 1,000 mg by mouth daily.    Marland Kitchen lamoTRIgine (LAMICTAL) 25 MG tablet Take 1 tablet (25 mg total) by mouth every evening. 90 tablet 3  . Meth-Hyo-M Bl-Na Phos-Ph Sal (URO-MP) 118 MG CAPS Take 118 mg by mouth daily as needed (UTI symptoms).   3  . nitrofurantoin, macrocrystal-monohydrate, (MACROBID) 100 MG capsule Take 1 capsule (100 mg total) by mouth 2 (two) times daily. 10 capsule 0  . silodosin (RAPAFLO) 8 MG CAPS capsule Take 8 mg by mouth every evening.   0  . traMADol (ULTRAM) 50 MG tablet Take 50 mg by mouth 3 (three) times daily.    . Vitamin D-Vitamin K (VITAMIN K2-VITAMIN D3 PO) Take 1 capsule by mouth daily.    . diazepam (VALIUM) 5 MG tablet Take 1 tablet (5 mg total) by mouth every 6 (six) hours as needed for muscle spasms. (Patient not taking: Reported on 12/13/2019) 30 tablet 0  . ondansetron (ZOFRAN) 4 MG tablet Take 1 tablet (4 mg total) by mouth every 8 (eight) hours as needed for nausea or vomiting. (Patient not taking: Reported on 12/13/2019) 14 tablet 0  . potassium chloride (KLOR-CON 10) 10 MEQ tablet Take 2 tablets (20 mEq total) by mouth daily. For 7 days (Patient not taking: Reported on 12/13/2019) 14 tablet 0  . potassium chloride SA (KLOR-CON) 20 MEQ tablet Take 1 tablet (20 mEq total) by mouth daily for 7 days. (Patient not taking: Reported on 12/13/2019) 7 tablet 0  . TURMERIC CURCUMIN PO Take 400 mg by mouth daily.     No facility-administered medications prior to visit.     EXAM:  BP 128/64   Pulse 85   Temp 97.9 F (36.6 C) (Temporal)   Ht 5\' 1"  (1.549 m)   Wt 103 lb 9.6 oz (47 kg)   LMP  (LMP Unknown)   SpO2 95%   BMI 19.58 kg/m   Body mass index is 19.58 kg/m. BP Readings from Last 3 Encounters:  12/13/19 128/64  09/19/19 139/67  09/18/19 131/61   Wt Readings from Last 3 Encounters:  12/13/19 103 lb 9.6 oz (47 kg)  09/08/19 105  lb (47.6 kg)  08/16/19 105 lb (47.6 kg)     GENERAL: vitals reviewed and listed above, alert, oriented, appears well hydrated and in no acute distress HEENT: atraumatic, conjunctiva  clear, no obvious abnormalities on inspection of external nose and ears OP :masked  Look quite well today  NECK: no obvious masses on inspection palpation  LUNGS: clear to auscultation bilaterally, no wheezes, rales or rhonchi, good air movement CV: HRRR, no clubbing cyanosis or  peripheral edema nl cap refill  MS: moves all extremities without noticeable focal  Abnormality right foot in afo  Walk with cane assistance  PSYCH: pleasant and cooperative, no obvious depression or anxiety Lab Results  Component Value Date   WBC 10.1 09/18/2019   HGB 11.5 (L) 09/18/2019   HCT 33.2 (L) 09/18/2019   PLT 228 09/18/2019   GLUCOSE 103 (H) 09/29/2019   CHOL 241 (H) 04/02/2016   TRIG 78.0 04/02/2016   HDL 111.30 04/02/2016   LDLDIRECT 110.1 11/27/2010   LDLCALC 114 (H) 04/02/2016   ALT 14 07/22/2018   AST 23 07/22/2018   NA 136 09/29/2019   K 4.4 09/29/2019   CL 98 09/29/2019   CREATININE 0.81 09/29/2019   BUN 17 09/29/2019   CO2 28 09/29/2019   TSH 0.83 09/26/2017   INR 1.01 03/25/2016   BP Readings from Last 3 Encounters:  12/13/19 128/64  09/19/19 139/67  09/18/19 131/61    ASSESSMENT AND PLAN:  Discussed the following assessment and plan:  Headache, unspecified headache type - better after starting allegra   Elevated blood pressure reading - nl today  Sx better  Can check bp forgot monitor but can bring to a visit to vet as "good enough"  Monitor headaches last labs were ok sodium nl etc   Appears to respond to allergy med so follow  She can send in readings   Follow  -Patient advised to return or notify health care team  if  new concerns arise.  Patient Instructions  Take blood pressure readings twice a day for 3-5  days and then periodically .To ensure below 140/90   .Send in readings      As indicated  120/80 is perfect but  Below 140/90 is acceptable.     How to Take Your Blood Pressure You can take your blood pressure at home with a machine. You may need to check your blood pressure at home:  To check if you have high blood pressure (hypertension).  To check your blood pressure over time.  To make sure your blood pressure medicine is working. Supplies needed: You will need a blood pressure machine, or monitor. You can buy one at a drugstore or online. When choosing one:  Choose one with an arm cuff.  Choose one that wraps around your upper arm. Only one finger should fit between your arm and the cuff.  Do not choose one that measures your blood pressure from your wrist or finger. Your doctor can suggest a monitor. How to prepare Avoid these things for 30 minutes before checking your blood pressure:  Drinking caffeine.  Drinking alcohol.  Eating.  Smoking.  Exercising. Five minutes before checking your blood pressure:  Pee.  Sit in a dining chair. Avoid sitting in a soft couch or armchair.  Be quiet. Do not talk. How to take your blood pressure Follow the instructions that came with your machine. If you have a digital blood pressure monitor, these may be the instructions: 1. Sit  up straight. 2. Place your feet on the floor. Do not cross your ankles or legs. 3. Rest your left arm at the level of your heart. You may rest it on a table, desk, or chair. 4. Pull up your shirt sleeve. 5. Wrap the blood pressure cuff around the upper part of your left arm. The cuff should be 1 inch (2.5 cm) above your elbow. It is best to wrap the cuff around bare skin. 6. Fit the cuff snugly around your arm. You should be able to place only one finger between the cuff and your arm. 7. Put the cord inside the groove of your elbow. 8. Press the power button. 9. Sit quietly while the cuff fills with air and loses air. 10. Write down the numbers on the screen. 11. Wait 2-3  minutes and then repeat steps 1-10. What do the numbers mean? Two numbers make up your blood pressure. The first number is called systolic pressure. The second is called diastolic pressure. An example of a blood pressure reading is "120 over 80" (or 120/80). If you are an adult and do not have a medical condition, use this guide to find out if your blood pressure is normal: Normal  First number: below 120.  Second number: below 80. Elevated  First number: 120-129.  Second number: below 80. Hypertension stage 1  First number: 130-139.  Second number: 80-89. Hypertension stage 2  First number: 140 or above.  Second number: 93 or above. Your blood pressure is above normal even if only the top or bottom number is above normal. Follow these instructions at home:  Check your blood pressure as often as your doctor tells you to.  Take your monitor to your next doctor's appointment. Your doctor will: ? Make sure you are using it correctly. ? Make sure it is working right.  Make sure you understand what your blood pressure numbers should be.  Tell your doctor if your medicines are causing side effects. Contact a doctor if:  Your blood pressure keeps being high. Get help right away if:  Your first blood pressure number is higher than 180.  Your second blood pressure number is higher than 120. This information is not intended to replace advice given to you by your health care provider. Make sure you discuss any questions you have with your health care provider. Document Revised: 08/01/2017 Document Reviewed: 01/26/2016 Elsevier Patient Education  2020 [employer] Linzie Boursiquot M.D.

## 2019-12-13 ENCOUNTER — Other Ambulatory Visit: Payer: Self-pay

## 2019-12-13 ENCOUNTER — Encounter: Payer: Self-pay | Admitting: Internal Medicine

## 2019-12-13 ENCOUNTER — Ambulatory Visit: Payer: Medicare PPO | Admitting: Internal Medicine

## 2019-12-13 VITALS — BP 128/64 | HR 85 | Temp 97.9°F | Ht 61.0 in | Wt 103.6 lb

## 2019-12-13 DIAGNOSIS — R030 Elevated blood-pressure reading, without diagnosis of hypertension: Secondary | ICD-10-CM

## 2019-12-13 DIAGNOSIS — R519 Headache, unspecified: Secondary | ICD-10-CM

## 2019-12-13 NOTE — Patient Instructions (Addendum)
Take blood pressure readings twice a day for 3-5  days and then periodically .To ensure below 140/90   .Send in readings     As indicated  120/80 is perfect but  Below 140/90 is acceptable.     How to Take Your Blood Pressure You can take your blood pressure at home with a machine. You may need to check your blood pressure at home:  To check if you have high blood pressure (hypertension).  To check your blood pressure over time.  To make sure your blood pressure medicine is working. Supplies needed: You will need a blood pressure machine, or monitor. You can buy one at a drugstore or online. When choosing one:  Choose one with an arm cuff.  Choose one that wraps around your upper arm. Only one finger should fit between your arm and the cuff.  Do not choose one that measures your blood pressure from your wrist or finger. Your doctor can suggest a monitor. How to prepare Avoid these things for 30 minutes before checking your blood pressure:  Drinking caffeine.  Drinking alcohol.  Eating.  Smoking.  Exercising. Five minutes before checking your blood pressure:  Pee.  Sit in a dining chair. Avoid sitting in a soft couch or armchair.  Be quiet. Do not talk. How to take your blood pressure Follow the instructions that came with your machine. If you have a digital blood pressure monitor, these may be the instructions: 1. Sit up straight. 2. Place your feet on the floor. Do not cross your ankles or legs. 3. Rest your left arm at the level of your heart. You may rest it on a table, desk, or chair. 4. Pull up your shirt sleeve. 5. Wrap the blood pressure cuff around the upper part of your left arm. The cuff should be 1 inch (2.5 cm) above your elbow. It is best to wrap the cuff around bare skin. 6. Fit the cuff snugly around your arm. You should be able to place only one finger between the cuff and your arm. 7. Put the cord inside the groove of your elbow. 8. Press the power  button. 9. Sit quietly while the cuff fills with air and loses air. 10. Write down the numbers on the screen. 11. Wait 2-3 minutes and then repeat steps 1-10. What do the numbers mean? Two numbers make up your blood pressure. The first number is called systolic pressure. The second is called diastolic pressure. An example of a blood pressure reading is "120 over 80" (or 120/80). If you are an adult and do not have a medical condition, use this guide to find out if your blood pressure is normal: Normal  First number: below 120.  Second number: below 80. Elevated  First number: 120-129.  Second number: below 80. Hypertension stage 1  First number: 130-139.  Second number: 80-89. Hypertension stage 2  First number: 140 or above.  Second number: 83 or above. Your blood pressure is above normal even if only the top or bottom number is above normal. Follow these instructions at home:  Check your blood pressure as often as your doctor tells you to.  Take your monitor to your next doctor's appointment. Your doctor will: ? Make sure you are using it correctly. ? Make sure it is working right.  Make sure you understand what your blood pressure numbers should be.  Tell your doctor if your medicines are causing side effects. Contact a doctor if:  Your blood  pressure keeps being high. Get help right away if:  Your first blood pressure number is higher than 180.  Your second blood pressure number is higher than 120. This information is not intended to replace advice given to you by your health care provider. Make sure you discuss any questions you have with your health care provider. Document Revised: 08/01/2017 Document Reviewed: 01/26/2016 Elsevier Patient Education  2020 [employer].

## 2019-12-16 ENCOUNTER — Ambulatory Visit: Payer: Medicare PPO | Attending: Internal Medicine

## 2019-12-16 ENCOUNTER — Encounter: Payer: Self-pay | Admitting: Obstetrics & Gynecology

## 2019-12-16 ENCOUNTER — Other Ambulatory Visit: Payer: Self-pay

## 2019-12-16 ENCOUNTER — Ambulatory Visit: Payer: Medicare PPO | Admitting: Obstetrics & Gynecology

## 2019-12-16 VITALS — BP 120/80 | HR 80 | Temp 97.2°F | Resp 16 | Ht 61.0 in | Wt 106.0 lb

## 2019-12-16 DIAGNOSIS — Z23 Encounter for immunization: Secondary | ICD-10-CM

## 2019-12-16 DIAGNOSIS — N8111 Cystocele, midline: Secondary | ICD-10-CM | POA: Diagnosis not present

## 2019-12-16 NOTE — Progress Notes (Signed)
79 y.o. G1P0 Widowed White or Caucasian female here for prolapse of bladder and possible pessary use.  Has seen Dr. Matilde Sprang who referred her.  Denies any vaginal bleeding or discharge.  Does typically feel like she empties her bladder completely.  Does not surgical correction.  Does not have much urinary leaking.  She had a hysterectomy in 1973 with partial ovary removal due to ovarian cyst.  Pt had one prior pregnancy.  Never had any abnormal pap smears.  No LMP recorded (lmp unknown). Patient has had a hysterectomy.          Sexually active: No.  The current method of family planning is status post hysterectomy.    Exercising: Yes.    walking & pilates Smoker:  no  Health Maintenance: Pap:  unsure History of abnormal Pap:  no MMG:  Yrs ago Colonoscopy:  2012 BMD:   2019 TDaP:  2012 Pneumonia vaccine(s):  2017 Zoster: 2013 Hep C testing: not done Screening Labs: not indicated today   reports that she quit smoking about 52 years ago. Her smoking use included cigarettes. She has a 0.50 pack-year smoking history. She has never used smokeless tobacco. She reports current alcohol use of about 4.0 standard drinks of alcohol per week. She reports that she does not use drugs.  Past Medical History:  Diagnosis Date  . Anemia   . Arthritis   . Back pain   . Cancer ([place])    skin nose  . Depression    situational,due to pain  . Leg weakness   . Osteopenia   . PONV (postoperative nausea and vomiting)   . Right leg pain   . Seizures ([place])    05/13/2019- 4 years ago, "couldnt process" couldnt speak happened 2 times; none since  . 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  . Urinary tract infection   . Wears hearing aid     Past Surgical History:  Procedure Laterality Date  . ABDOMINAL EXPOSURE N/A 05/17/2019   Procedure: ABDOMINAL EXPOSURE;  Surgeon: Angelia Mould, MD;  Location: [facility];  Service: Vascular;  Laterality: N/A;  .  ABDOMINAL HYSTERECTOMY  1973   1/2 of ovary left cyst on ovary  . ANTERIOR LUMBAR FUSION N/A 05/17/2019   Procedure: Lumbar five Sacral one  Anterior lumbar interbody fusion;  Surgeon: Kristeen Miss, MD;  Location: [facility];  Service: Neurosurgery;  Laterality: N/A;  . APPENDECTOMY     removed with hysterectomy  . COLONOSCOPY    . LUMBAR WOUND DEBRIDEMENT  12/06/2011   Procedure: LUMBAR WOUND DEBRIDEMENT;  Surgeon: Kristeen Miss, MD;  Location: [facility];  Service: Neurosurgery;  Laterality: N/A;  Repair of Pseudomeningocele Lumbar wound  . ROTATOR CUFF REPAIR     left   . shoulder rotator culff  Left 2006  . spinal cord tumor  1987   2012 october     Current Outpatient Medications  Medication Sig Dispense Refill  . Calcium Citrate-Vitamin D (CALCIUM + D PO) Take 2 tablets by mouth 2 (two) times daily.    . diclofenac sodium (VOLTAREN) 1 % GEL Apply 1 application topically 4 (four) times daily as needed (pain).    . Estradiol 10 MCG TABS vaginal tablet Place 1 tablet (10 mcg total) vaginally 2 (two) times a week. 8 tablet 11  . Garlic 123XX123 MG CAPS Take 1,000 mg by mouth daily.    Marland Kitchen lamoTRIgine (LAMICTAL) 25 MG tablet Take 1 tablet (  25 mg total) by mouth every evening. 90 tablet 3  . Meth-Hyo-M Bl-Na Phos-Ph Sal (URO-MP) 118 MG CAPS Take 118 mg by mouth daily as needed (UTI symptoms).   3  . nitrofurantoin, macrocrystal-monohydrate, (MACROBID) 100 MG capsule Take 1 capsule (100 mg total) by mouth 2 (two) times daily. 10 capsule 0  . silodosin (RAPAFLO) 8 MG CAPS capsule Take 8 mg by mouth every evening.   0  . traMADol (ULTRAM) 50 MG tablet Take 50 mg by mouth 3 (three) times daily.    . Vitamin D-Vitamin K (VITAMIN K2-VITAMIN D3 PO) Take 1 capsule by mouth daily.     No current facility-administered medications for this visit.    Family History  Problem Relation Age of Onset  . Stroke Mother        2010  . Heart attack Father   . Cancer Paternal Grandmother     Review of Systems   Constitutional: Negative.   HENT: Negative.   Eyes: Negative.   Respiratory: Negative.   Cardiovascular: Negative.   Gastrointestinal: Negative.   Endocrine: Negative.   Genitourinary:       Prolapse, trouble urinating with prolapse  Musculoskeletal: Negative.   Skin: Negative.   Allergic/Immunologic: Negative.   Neurological: Negative.   Psychiatric/Behavioral: Negative.     Exam:   BP 120/80   Pulse 80   Temp (!) 97.2 F (36.2 C) (Skin)   Resp 16   Ht 5\' 1"  (1.549 m)   Wt 106 lb (48.1 kg)   LMP  (LMP Unknown)   BMI 20.03 kg/m   Height: 5\' 1"  (154.9 cm)  Ht Readings from Last 3 Encounters:  12/16/19 5\' 1"  (1.549 m)  12/13/19 5\' 1"  (1.549 m)  09/08/19 5\' 1"  (1.549 m)    General appearance: alert, cooperative and appears stated age Head: Normocephalic, without obvious abnormality, atraumatic Extremities: extremities normal, atraumatic, no cyanosis or edema Skin: Skin color, texture, turgor normal. No rashes or lesions Lymph nodes: Cervical, supraclavicular, and axillary nodes normal. No abnormal inguinal nodes palpated Neurologic: Grossly normal   Pelvic: External genitalia:  no lesions              Urethra:  normal appearing urethra with no masses, tenderness or lesions              Bartholins and Skenes: normal                 Vagina: atrophic appearing vagina with normal color and discharge, no lesions, 3rd degree cystocele              Cervix: absent              Pap taken: No. Bimanual Exam:  Uterus:  uterus absent              Adnexa: no mass, fullness, tenderness  Pt is desirous of trying to use a pessary.  Fitting occurred today.  In dorsal lithotomy position, #4 incontinence ring with support was placed.  Although this could be placed, she could feel the presence of the pessary so it was removed.  A #3 incontinence ring was then placed.  She was much more comfortable with this pessary.  Pt was able to walk and void with pessary.  She could not expel  pessary with Valsalva maneuver.  Fitting pessary was removed and Milex #3 incontinence ring with support was placed.     Chaperone, Royal Hawthorn, CMA, was present for exam.  A:  Cystocele,  desirous of conservative management  P:   #3 incontinence ring pessary was placed today.  Recheck 2 weeks.  Will teach removal and replacement at follow up visit. Uses vagifem but will consider switching to another vaginal estrogen cream product or vaginal Vit E product when has follow up.

## 2019-12-16 NOTE — Progress Notes (Signed)
   Covid-19 Vaccination [facility]  Name:  Kaitlyn Flowers    MRN: [id number] DOB: 04-17-1941  12/16/2019  Ms. Mikula was observed post Covid-19 immunization for 15 minutes without incident. She was provided with Vaccine Information Sheet and instruction to access the V-Safe system.   Ms. Bonitz was instructed to call 911 with any severe reactions post vaccine: Marland Kitchen Difficulty breathing  . Swelling of face and throat  . A fast heartbeat  . A bad rash all over body  . Dizziness and weakness   Immunizations Administered    Name Date Dose VIS Date Route   Pfizer COVID-19 Vaccine 12/16/2019 11:55 AM 0.3 mL 08/13/2019 Intramuscular   Manufacturer: [place]   Lot: [id number]   [place]: [id number]

## 2019-12-19 ENCOUNTER — Encounter: Payer: Self-pay | Admitting: Obstetrics & Gynecology

## 2019-12-20 ENCOUNTER — Telehealth: Payer: Self-pay | Admitting: Obstetrics & Gynecology

## 2019-12-20 DIAGNOSIS — N8111 Cystocele, midline: Secondary | ICD-10-CM

## 2019-12-20 NOTE — Telephone Encounter (Signed)
Left message for pt to return call to triage RN. 

## 2019-12-20 NOTE — Telephone Encounter (Signed)
Patient is having problems with pessary.

## 2019-12-21 DIAGNOSIS — L718 Other rosacea: Secondary | ICD-10-CM | POA: Diagnosis not present

## 2019-12-21 DIAGNOSIS — L820 Inflamed seborrheic keratosis: Secondary | ICD-10-CM | POA: Diagnosis not present

## 2019-12-21 DIAGNOSIS — Z85828 Personal history of other malignant neoplasm of skin: Secondary | ICD-10-CM | POA: Diagnosis not present

## 2019-12-21 NOTE — Telephone Encounter (Signed)
Pessary placed 12/16/19.  New pt/referral from Dr Matilde Sprang on 4/15 Cystocele  Has Vagifem Rx, but not currently using.  Hysterectomy 1973 with partial ovary removed d/t cyst.   Spoke with pt. Pt reports 2 days after having pessary placed, pt states  " cant hold urine" and not making it to the restroom while soaking pad and pants. . Pt denies all UTI sx. Pt states now not drinking as much fluids and it has helped some. Tried drinking small amount of water today and had leaking.  Pt has pessary recheck scheduled on 4/27.  Advised will discuss with Dr Sabra Heck and return call to pt for recommendations. Pt agreeable.   Routing to Dr Sabra Heck.

## 2019-12-21 NOTE — Telephone Encounter (Signed)
Spoke with pt. Pt given recommendations per Dr Sabra Heck. Pt agreeable.  Nurse visit scheduled for Urine micro and culture 4/21 at 110 am. Pt verbalized understanding. Orders placed.  Pt also states having leaking when bladder full from drinking fluids and movement, sneezing and coughing.   Routing to Dr Sabra Heck for review.  Encounter closed.

## 2019-12-21 NOTE — Telephone Encounter (Signed)
She should come and give a urine sample for a urine culture and micro to rule out infection.

## 2019-12-21 NOTE — Telephone Encounter (Signed)
Patient is returning call.  °

## 2019-12-22 ENCOUNTER — Other Ambulatory Visit: Payer: Self-pay

## 2019-12-22 ENCOUNTER — Ambulatory Visit (INDEPENDENT_AMBULATORY_CARE_PROVIDER_SITE_OTHER): Payer: Medicare PPO

## 2019-12-22 DIAGNOSIS — N8111 Cystocele, midline: Secondary | ICD-10-CM | POA: Diagnosis not present

## 2019-12-22 NOTE — Progress Notes (Signed)
Pt here today for urine cx and micro to rule out infection. Pt complains of burning when urinating, and unable to hold urine in. Leaking two days after pessary was placed. Has appt 12/28/2019 to recheck pessary.

## 2019-12-23 DIAGNOSIS — M4727 Other spondylosis with radiculopathy, lumbosacral region: Secondary | ICD-10-CM | POA: Diagnosis not present

## 2019-12-23 DIAGNOSIS — Z981 Arthrodesis status: Secondary | ICD-10-CM | POA: Diagnosis not present

## 2019-12-23 DIAGNOSIS — M48061 Spinal stenosis, lumbar region without neurogenic claudication: Secondary | ICD-10-CM | POA: Diagnosis not present

## 2019-12-23 LAB — URINALYSIS, MICROSCOPIC ONLY
Bacteria, UA: NONE SEEN
Casts: NONE SEEN /lpf
Epithelial Cells (non renal): NONE SEEN /hpf (ref 0–10)
RBC, Urine: NONE SEEN /hpf (ref 0–2)
WBC, UA: NONE SEEN /hpf (ref 0–5)

## 2019-12-24 ENCOUNTER — Telehealth: Payer: Self-pay | Admitting: *Deleted

## 2019-12-24 LAB — URINE CULTURE

## 2019-12-24 NOTE — Telephone Encounter (Signed)
Patient would like to go over results.

## 2019-12-24 NOTE — Progress Notes (Deleted)
GYNECOLOGY  VISIT  CC:   ***  HPI: 79 y.o. G1P0 Widowed White or Caucasian female here for follow up of pessary.  GYNECOLOGIC HISTORY: No LMP recorded (lmp unknown). Patient has had a hysterectomy. Contraception: hysterectomy Menopausal hormone therapy: ***  Patient Active Problem List   Diagnosis Date Noted  . Spondylolisthesis at L5-S1 level 05/17/2019  . Tension-type headache, not intractable 06/11/2017  . Right leg weakness 06/11/2017  . Labile hypertension 09/27/2016  . Autonomic dysfunction 09/27/2016  . Chronic pain syndrome 09/10/2016  . Localization-related idiopathic epilepsy and epileptic syndromes with seizures of localized onset, not intractable, without status epilepticus ([place]) 08/01/2016  . Hyponatremia 04/01/2016  . Abdominal cramping 01/22/2016  . Nocturnal leg cramps 12/07/2015  . 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 Sacroiliitis ([place]) 06/06/2014  . Lumbar radicular pain 06/06/2014  . Osteoporosis 12/26/2013  . Glucosuria 12/16/2012  . Tick bite of upper arm 12/16/2012  . Back pain with radiation 06/12/2012  . Plantar wart of right foot 12/29/2011  . 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/11/2011  . Fatigue 11/11/2011  . 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 Medicare annual wellness visit, initial 12/18/2010  . Wears hearing aid 12/18/2010  . Back pain 11/23/2010  . Spinal cord tumor   . ABDOMINAL BRUIT 07/21/2009  . NEOPLASM, SKIN, UNCERTAIN BEHAVIOR [id number]  . ANEMIA NOS 05/12/2007  . DISORDER, STOMACH FUNCTION NEC 04/07/2007    Past Medical History:  Diagnosis Date  . Anemia   . Arthritis   . Back pain   . Cancer ([place])    skin nose  . Depression    situational,due to pain  . Leg weakness   . Osteopenia   . PONV (postoperative nausea and vomiting)   . Right leg pain   . Seizures ([place])    05/13/2019- 4 years ago, "couldnt process" couldnt speak happened 2 times; none since  . 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  . Urinary tract infection   . Wears hearing aid     Past Surgical History:  Procedure Laterality Date  . ABDOMINAL EXPOSURE N/A 05/17/2019   Procedure: ABDOMINAL EXPOSURE;  Surgeon: Angelia Mould, MD;  Location: [facility];  Service: Vascular;  Laterality: N/A;  . ABDOMINAL HYSTERECTOMY  1973   1/2 of ovary left cyst on ovary  . ANTERIOR LUMBAR FUSION N/A 05/17/2019   Procedure: Lumbar five Sacral one  Anterior lumbar interbody fusion;  Surgeon: Kristeen Miss, MD;  Location: [facility];  Service: Neurosurgery;  Laterality: N/A;  . APPENDECTOMY     removed with hysterectomy  . COLONOSCOPY    . LUMBAR WOUND DEBRIDEMENT  12/06/2011   Procedure: LUMBAR WOUND DEBRIDEMENT;  Surgeon: Kristeen Miss, MD;  Location: [facility];  Service: Neurosurgery;  Laterality: N/A;  Repair of Pseudomeningocele Lumbar wound  . ROTATOR CUFF REPAIR     left   . shoulder rotator culff  Left 2006  . spinal cord tumor  1987   2012 october     MEDS:   Current Outpatient Medications on File Prior to Visit  Medication Sig Dispense Refill  . Calcium Citrate-Vitamin D (CALCIUM + D PO) Take 2 tablets by mouth 2 (two) times daily.    . diclofenac sodium (VOLTAREN) 1 % GEL Apply 1 application topically 4 (four) times daily as needed (pain).    . Estradiol 10 MCG TABS vaginal tablet Place 1 tablet (10 mcg total) vaginally 2 (  two) times a week. 8 tablet 11  . Garlic 123XX123 MG CAPS Take 1,000 mg by mouth daily.    Marland Kitchen lamoTRIgine (LAMICTAL) 25 MG tablet Take 1 tablet (25 mg total) by mouth every evening. 90 tablet 3  . Meth-Hyo-M Bl-Na Phos-Ph Sal (URO-MP) 118 MG CAPS Take 118 mg by mouth daily as needed (UTI symptoms).   3  . silodosin (RAPAFLO) 8 MG CAPS capsule Take 8 mg by mouth every evening.   0  . traMADol (ULTRAM) 50 MG tablet Take 50 mg by mouth 3 (three) times daily.    . Vitamin D-Vitamin K (VITAMIN K2-VITAMIN D3 PO) Take 1 capsule by mouth  daily.     No current facility-administered medications on file prior to visit.    ALLERGIES: Ibuprofen-famotidine, Fentanyl, Pollen extract-tree extract [pollen extract], Pregabalin, and Penicillins  Family History  Problem Relation Age of Onset  . Stroke Mother        2010  . Heart attack Father   . Cancer Paternal Grandmother     SH:  ***  Review of Systems  PHYSICAL EXAMINATION:    LMP  (LMP Unknown)     General appearance: alert, cooperative and appears stated age Neck: no adenopathy, supple, symmetrical, trachea midline and thyroid {CHL AMB PHY EX THYROID NORM [phone]::"normal to inspection and palpation"} CV:  {Exam; heart brief:31539} Lungs:  {pe lungs ob:314451::"clear to auscultation, no wheezes, rales or rhonchi, symmetric air entry"} Breasts: {Exam; breast:13139::"normal appearance, no masses or tenderness"} Abdomen: soft, non-tender; bowel sounds normal; no masses,  no organomegaly Lymph:  no inguinal LAD noted  Pelvic: External genitalia:  no lesions              Urethra:  normal appearing urethra with no masses, tenderness or lesions              Bartholins and Skenes: normal                 Vagina: normal appearing vagina with normal color and discharge, no lesions              Cervix: {CHL AMB PHY EX CERVIX NORM [phone]::"no lesions"}              Bimanual Exam:  Uterus:  {CHL AMB PHY EX UTERUS NORM DEFAULT:[phone]::"normal size, contour, position, consistency, mobility, non-tender"}              Adnexa: {CHL AMB PHY EX ADNEXA NO MASS [phone]::"no mass, fullness, tenderness"}              Rectovaginal: {yes no:314532}.  Confirms.              Anus:  normal sphincter tone, no lesions  Chaperone, ***Terence Lux, CMA, was present for exam.  Assessment: ***  Plan: ***   ~{NUMBERS; -10-45 JOINT ROM:10287} minutes spent with patient >50% of time was in face to face discussion of above.

## 2019-12-24 NOTE — Telephone Encounter (Signed)
Please explain that this is common esp in women who have to self cath before a pessary.  We may need her to see Dr. Matilde Sprang again.

## 2019-12-24 NOTE — Telephone Encounter (Signed)
Spoke with patient. Advised patient 12/22/19 UC was negative for infection. Dr. Sabra Heck still needs to review and make any additional recommendations. Patient denies any new symptoms, states she is concerned about urinary incontinence. Patient is scheduled for pessary recheck on 12/28/19. Advised patient I will provide an update to Dr. Sabra Heck and our office will f/u. Patient agreeable.     Routing to Dr. Sabra Heck to review 12/22/19 urine culture results and advise.

## 2019-12-24 NOTE — Telephone Encounter (Signed)
Left message to call Ron Junco, RN at [facility] [phone].   

## 2019-12-27 ENCOUNTER — Other Ambulatory Visit: Payer: Self-pay

## 2019-12-28 ENCOUNTER — Ambulatory Visit: Payer: Medicare PPO | Admitting: Obstetrics & Gynecology

## 2019-12-28 NOTE — Telephone Encounter (Signed)
Spoke with patient, advised per Dr. Sabra Heck. Patient reports symptoms are improving. Denies any new symptoms.  Will plan to further discuss with Dr. Sabra Heck at f/u on 4/29. Patient thankful for call.   Routing to provider for final review. Patient is agreeable to disposition. Will close encounter.

## 2019-12-28 NOTE — Progress Notes (Signed)
GYNECOLOGY  VISIT  CC:   Pessary recheck  HPI: 79 y.o. G1P0 Widowed White or Caucasian female here for follow up after pessary placement.  Patient has cystocele with incomplete bladder emptying that was requiring intermittent self-catheterization's.  Once pessary was placed, patient was able to void very easily without difficulty.  She feels like she is completely emptying her bladder.  She has no dysuria, urinary frequency or urgency symptoms.  She is having increased urinary incontinence.  This has become frustrating and was a bit surprising for her.  She has noticed that she leaks most when she is moving from a sitting or standing position, with laughing or she is lifting thing of significant size.  We discussed today the etiology of this.  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  She is really not interested in surgery.  Physical therapy was discussed.  She is interested in this.  We also discussed the bulking agents may be a solution for her.  She is aware I do not do this and will need to follow-up again with Dr. Lunette Stands.  She does not want help with an appointment will call for this.  In regards to the pessary, she would like to try and remove it today on her own.  She is not sure she can do this but she will expect to try.  She denies any vaginal bleeding vaginal discharge.  GYNECOLOGIC HISTORY: No LMP recorded (lmp unknown). Patient has had a hysterectomy. Contraception: hysterectomy Menopausal hormone therapy: None  Patient Active Problem List   Diagnosis Date Noted  . Spondylolisthesis at L5-S1 level 05/17/2019  . Tension-type headache, not intractable 06/11/2017  . Right leg weakness 06/11/2017  . Labile hypertension 09/27/2016  . Autonomic dysfunction 09/27/2016  . Chronic pain syndrome 09/10/2016  . Localization-related idiopathic  epilepsy and epileptic syndromes with seizures of localized onset, not intractable, without status epilepticus ([place]) 08/01/2016  . Hyponatremia 04/01/2016  . Abdominal cramping 01/22/2016  . Nocturnal leg cramps 12/07/2015  . 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 Sacroiliitis ([place]) 06/06/2014  . Lumbar radicular pain 06/06/2014  . Osteoporosis 12/26/2013  . Glucosuria 12/16/2012  . Tick bite of upper arm 12/16/2012  . Back pain with radiation 06/12/2012  . Plantar wart of right foot 12/29/2011  . 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/11/2011  . Fatigue 11/11/2011  . 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 Medicare annual wellness visit, initial 12/18/2010  . Wears hearing aid 12/18/2010  . Back pain 11/23/2010  . Spinal cord tumor   . ABDOMINAL BRUIT 07/21/2009  . NEOPLASM, SKIN, UNCERTAIN BEHAVIOR [id number]  . ANEMIA NOS 05/12/2007  . DISORDER, STOMACH FUNCTION NEC 04/07/2007    Past Medical History:  Diagnosis Date  . Anemia   . Arthritis   . Back pain   . Cancer ([place])    skin nose  . Depression    situational,due to pain  . Leg weakness   . Osteopenia   . PONV (postoperative nausea and vomiting)   . Right leg pain   . Seizures ([place])    05/13/2019- 4 years ago, "couldnt process" couldnt speak happened 2 times; none since  . 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  . Urinary tract infection   . Wears hearing aid     Past  Surgical History:  Procedure Laterality Date  . ABDOMINAL EXPOSURE N/A 05/17/2019   Procedure: ABDOMINAL EXPOSURE;  Surgeon: Angelia Mould, MD;  Location: [facility];  Service: Vascular;  Laterality: N/A;  . ABDOMINAL HYSTERECTOMY  1973   1/2 of ovary left cyst on ovary  . ANTERIOR LUMBAR FUSION N/A 05/17/2019   Procedure: Lumbar five Sacral one  Anterior lumbar interbody fusion;  Surgeon: Kristeen Miss, MD;  Location: [facility];  Service: Neurosurgery;   Laterality: N/A;  . APPENDECTOMY     removed with hysterectomy  . COLONOSCOPY    . LUMBAR WOUND DEBRIDEMENT  12/06/2011   Procedure: LUMBAR WOUND DEBRIDEMENT;  Surgeon: Kristeen Miss, MD;  Location: [facility];  Service: Neurosurgery;  Laterality: N/A;  Repair of Pseudomeningocele Lumbar wound  . ROTATOR CUFF REPAIR     left   . shoulder rotator culff  Left 2006  . spinal cord tumor  1987   2012 october     MEDS:   Current Outpatient Medications on File Prior to Visit  Medication Sig Dispense Refill  . Calcium Citrate-Vitamin D (CALCIUM + D PO) Take 2 tablets by mouth 2 (two) times daily.    . diclofenac sodium (VOLTAREN) 1 % GEL Apply 1 application topically 4 (four) times daily as needed (pain).    . Estradiol 10 MCG TABS vaginal tablet Place 1 tablet (10 mcg total) vaginally 2 (two) times a week. 8 tablet 11  . Garlic 123XX123 MG CAPS Take 1,000 mg by mouth daily.    Marland Kitchen lamoTRIgine (LAMICTAL) 25 MG tablet Take 1 tablet (25 mg total) by mouth every evening. 90 tablet 3  . Meth-Hyo-M Bl-Na Phos-Ph Sal (URO-MP) 118 MG CAPS Take 118 mg by mouth daily as needed (UTI symptoms).   3  . nitrofurantoin, macrocrystal-monohydrate, (MACROBID) 100 MG capsule     . silodosin (RAPAFLO) 8 MG CAPS capsule Take 8 mg by mouth every evening.   0  . traMADol (ULTRAM) 50 MG tablet Take 50 mg by mouth 3 (three) times daily.    . Vitamin D-Vitamin K (VITAMIN K2-VITAMIN D3 PO) Take 1 capsule by mouth daily.     No current facility-administered medications on file prior to visit.    ALLERGIES: Ibuprofen-famotidine, Fentanyl, Pollen extract-tree extract [pollen extract], Pregabalin, and Penicillins  Family History  Problem Relation Age of Onset  . Stroke Mother        2010  . Heart attack Father   . Cancer Paternal Grandmother     SH: Widowed, non-smoker  Review of Systems  Genitourinary:       Urinary incontinence    PHYSICAL EXAMINATION:    BP 112/74   Pulse 68   Temp (!) 97.5 F (36.4 C)  (Skin)   Resp 16   Wt 103 lb (46.7 kg)   LMP  (LMP Unknown)   BMI 19.46 kg/m     General appearance: alert, cooperative and appears stated age Lymph:  no inguinal LAD noted  Pelvic: External genitalia:  no lesions              Urethra:  normal appearing urethra with no masses, tenderness or lesions              Bartholins and Skenes: normal                 Vagina: normal appearing vagina with normal color and discharge, no lesions              Cervix: absent  Bimanual Exam:  Uterus:  uterus absent              Adnexa: no mass, fullness, tenderness  Despite instructions, patient was unsuccessful with removing her pessary.  I was able to remove it and clean it prior to replacement.  There are no vaginal lesions, vaginal discharge or bleeding at this point from the pessary.  Chaperone, Royal Hawthorn, CMA, was present for exam.  Assessment: Cystocele Incomplete bladder emptying that was requiring intermittent self catheterizations prior to pessary placement #3 incontinence ring with support pessary use Urinary incontinence with a negative urine culture  Plan: Patient is going to follow-up with urology and she will call for the appointment. Recheck with me in 3 to 4 months Rx for compounded vitamin E vaginal cream 200 units/mL, 1 mL PV twice weekly.  [facility]  Patient will displace this around the pessary. Referral for PT was made today as well   About 30 minutes total spent with patient

## 2019-12-30 ENCOUNTER — Ambulatory Visit: Payer: Medicare PPO | Admitting: Obstetrics & Gynecology

## 2019-12-30 ENCOUNTER — Encounter: Payer: Self-pay | Admitting: Obstetrics & Gynecology

## 2019-12-30 ENCOUNTER — Other Ambulatory Visit: Payer: Self-pay

## 2019-12-30 VITALS — BP 112/74 | HR 68 | Temp 97.5°F | Resp 16 | Wt 103.0 lb

## 2019-12-30 DIAGNOSIS — N8111 Cystocele, midline: Secondary | ICD-10-CM | POA: Diagnosis not present

## 2019-12-30 DIAGNOSIS — N393 Stress incontinence (female) (male): Secondary | ICD-10-CM

## 2019-12-30 DIAGNOSIS — N3642 Intrinsic sphincter deficiency (ISD): Secondary | ICD-10-CM

## 2019-12-30 DIAGNOSIS — R339 Retention of urine, unspecified: Secondary | ICD-10-CM | POA: Diagnosis not present

## 2019-12-30 DIAGNOSIS — Z4689 Encounter for fitting and adjustment of other specified devices: Secondary | ICD-10-CM | POA: Diagnosis not present

## 2019-12-30 MED ORDER — NONFORMULARY OR COMPOUNDED ITEM: 3 refills | Status: DC

## 2019-12-31 DIAGNOSIS — N8111 Cystocele, midline: Secondary | ICD-10-CM | POA: Insufficient documentation

## 2019-12-31 DIAGNOSIS — N393 Stress incontinence (female) (male): Secondary | ICD-10-CM | POA: Insufficient documentation

## 2019-12-31 DIAGNOSIS — R339 Retention of urine, unspecified: Secondary | ICD-10-CM | POA: Insufficient documentation

## 2020-01-07 DIAGNOSIS — R350 Frequency of micturition: Secondary | ICD-10-CM | POA: Diagnosis not present

## 2020-01-07 DIAGNOSIS — R3914 Feeling of incomplete bladder emptying: Secondary | ICD-10-CM | POA: Diagnosis not present

## 2020-01-10 ENCOUNTER — Ambulatory Visit: Payer: Medicare PPO | Attending: Internal Medicine

## 2020-01-10 DIAGNOSIS — Z23 Encounter for immunization: Secondary | ICD-10-CM

## 2020-01-10 NOTE — Progress Notes (Signed)
   Covid-19 Vaccination Clinic  Name:  Kaitlyn Flowers    MRN: [id number] DOB: December 10, 1940  01/10/2020  Kaitlyn Flowers was observed post Covid-19 immunization for 15 minutes without incident. She was provided with Vaccine Information Sheet and instruction to access the V-Safe system.   Kaitlyn Flowers was instructed to call 911 with any severe reactions post vaccine: Marland Kitchen Difficulty breathing  . Swelling of face and throat  . A fast heartbeat  . A bad rash all over body  . Dizziness and weakness   Immunizations Administered    Name Date Dose VIS Date Route   Pfizer COVID-19 Vaccine 01/10/2020 10:17 AM 0.3 mL 10/27/2018 Intramuscular   Manufacturer: [place]   Lot: [id number]   [place]: [id number]

## 2020-01-25 ENCOUNTER — Other Ambulatory Visit: Payer: Self-pay

## 2020-01-25 ENCOUNTER — Telehealth (INDEPENDENT_AMBULATORY_CARE_PROVIDER_SITE_OTHER): Payer: Medicare PPO | Admitting: Internal Medicine

## 2020-01-25 ENCOUNTER — Encounter: Payer: Self-pay | Admitting: Internal Medicine

## 2020-01-25 VITALS — Ht 61.0 in | Wt 100.0 lb

## 2020-01-25 DIAGNOSIS — R110 Nausea: Secondary | ICD-10-CM

## 2020-01-25 DIAGNOSIS — R5381 Other malaise: Secondary | ICD-10-CM

## 2020-01-25 DIAGNOSIS — Z8616 Personal history of COVID-19: Secondary | ICD-10-CM

## 2020-01-25 NOTE — Progress Notes (Signed)
Virtual Visit via Video Note  I connected with@ on 01/25/20 at  3:30 PM EDT by a video enabled telemedicine application and verified that I am speaking with the correct person using two identifiers. Location patient: home Location provider:work office Persons participating in the virtual visit: patient, provider  [place]Ith national recommendations  regarding COVID 19 pandemic   video visit is advised over in office visit for this patient.  Patient aware  of the limitations of evaluation and management by telemedicine and  availability of in person appointments. and agreed to proceed.   HPI: Kaitlyn Flowers presents for video visit Last week  Nausea and then achy  And hungry but gets nausea   getting better   But still immediate nausea  Similar to covid sx and is post vaccine May 10 but se went away .  Taken to eating  gramola  Cereal and almond milk.   Using pessary and has ui  Some better  Began mybetriq  New  But not assoc with sx    oin antibiotic  macrobid  Tried off all meds for a few days and no difference in sx a Friend said should get checked .  ROS: See pertinent positives and negatives per HPI. No fever sweats   Past Medical History:  Diagnosis Date  . Anemia   . Arthritis   . Back pain   . Cancer ([place])    skin nose  . Depression    situational,due to pain  . Leg weakness   . Osteopenia   . PONV (postoperative nausea and vomiting)   . Right leg pain   . Seizures ([place])    05/13/2019- 4 years ago, "couldnt process" couldnt speak happened 2 times; none since  . 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  . Urinary tract infection   . Wears hearing aid     Past Surgical History:  Procedure Laterality Date  . ABDOMINAL EXPOSURE N/A 05/17/2019   Procedure: ABDOMINAL EXPOSURE;  Surgeon: Angelia Mould, MD;  Location: [facility] [facility];  Service: Vascular;  Laterality: N/A;  . ABDOMINAL HYSTERECTOMY  1973   1/2 of ovary left cyst  on ovary  . ANTERIOR LUMBAR FUSION N/A 05/17/2019   Procedure: Lumbar five Sacral one  Anterior lumbar interbody fusion;  Surgeon: Kristeen Miss, MD;  Location: [place];  Service: Neurosurgery;  Laterality: N/A;  . APPENDECTOMY     removed with hysterectomy  . COLONOSCOPY    . LUMBAR WOUND DEBRIDEMENT  12/06/2011   Procedure: LUMBAR WOUND DEBRIDEMENT;  Surgeon: Kristeen Miss, MD;  Location: [facility];  Service: Neurosurgery;  Laterality: N/A;  Repair of Pseudomeningocele Lumbar wound  . ROTATOR CUFF REPAIR     left   . shoulder rotator culff  Left 2006  . spinal cord tumor  1987   2012 october     Family History  Problem Relation Age of Onset  . Stroke Mother        2010  . Heart attack Father   . Cancer Paternal Grandmother     Social History   Tobacco Use  . Smoking status: Former Smoker    Packs/day: 0.25    Years: 2.00    Pack years: 0.50    Types: Cigarettes    Quit date: 09/03/1967    Years since quitting: 52.4  . Smokeless tobacco: Never Used  . Tobacco comment: quit 48 years   Substance Use Topics  . Alcohol use:  Yes    Alcohol/week: 4.0 standard drinks    Types: 4 Glasses of wine per week    Comment: have a glass of wine with dinner   . Drug use: No      Current Outpatient Medications:  .  Calcium Citrate-Vitamin D (CALCIUM + D PO), Take 2 tablets by mouth 2 (two) times daily., Disp: , Rfl:  .  diclofenac sodium (VOLTAREN) 1 % GEL, Apply 1 application topically 4 (four) times daily as needed (pain)., Disp: , Rfl:  .  Garlic 123XX123 MG CAPS, Take 1,000 mg by mouth daily., Disp: , Rfl:  .  lamoTRIgine (LAMICTAL) 25 MG tablet, Take 1 tablet (25 mg total) by mouth every evening., Disp: 90 tablet, Rfl: 3 .  Meth-Hyo-M Bl-Na Phos-Ph Sal (URO-MP) 118 MG CAPS, Take 118 mg by mouth daily as needed (UTI symptoms). , Disp: , Rfl: 3 .  mirabegron ER (MYRBETRIQ) 50 MG TB24 tablet, Take 50 mg by mouth daily., Disp: , Rfl:  .  nitrofurantoin, macrocrystal-monohydrate, (MACROBID)  100 MG capsule, , Disp: , Rfl:  .  NONFORMULARY OR COMPOUNDED ITEM, Vitamin E vaginal cream 200u/ml.  One ml pv twice weekly.  Disp. 3 month supply., Disp: 24 each, Rfl: 3 .  traMADol (ULTRAM) 50 MG tablet, Take 50 mg by mouth 3 (three) times daily., Disp: , Rfl:  .  Vitamin D-Vitamin K (VITAMIN K2-VITAMIN D3 PO), Take 1 capsule by mouth daily., Disp: , Rfl:  .  Estradiol 10 MCG TABS vaginal tablet, Place 1 tablet (10 mcg total) vaginally 2 (two) times a week. (Patient not taking: Reported on 01/25/2020), Disp: 8 tablet, Rfl: 11 .  silodosin (RAPAFLO) 8 MG CAPS capsule, Take 8 mg by mouth every evening. , Disp: , Rfl: 0  EXAM: BP Readings from Last 3 Encounters:  12/30/19 112/74  12/22/19 104/60  12/16/19 120/80  was ok at urology 140 150 range some   VITALS per patient if applicable:  GENERAL: alert, oriented, appears well and in no acute distress  HEENT: atraumatic, conjunttiva clear, no obvious abnormalities on inspection of external nose and earsno jaundice   NECK: normal movements of the head and neck  LUNGS: on inspection no signs of respiratory distress, breathing rate appears normal, no obvious gross SOB, gasping or wheezing  CV: no obvious cyanosis  MS: moves all visible extremities without noticeable abnormality  PSYCH/NEURO: pleasant and cooperative, no obvious depression or anxiety, speech and thought processing grossly intact Lab Results  Component Value Date   WBC 10.1 09/18/2019   HGB 11.5 (L) 09/18/2019   HCT 33.2 (L) 09/18/2019   PLT 228 09/18/2019   GLUCOSE 103 (H) 09/29/2019   CHOL 241 (H) 04/02/2016   TRIG 78.0 04/02/2016   HDL 111.30 04/02/2016   LDLDIRECT 110.1 11/27/2010   LDLCALC 114 (H) 04/02/2016   ALT 14 07/22/2018   AST 23 07/22/2018   NA 136 09/29/2019   K 4.4 09/29/2019   CL 98 09/29/2019   CREATININE 0.81 09/29/2019   BUN 17 09/29/2019   CO2 28 09/29/2019   TSH 0.83 09/26/2017   INR 1.01 03/25/2016    ASSESSMENT AND PLAN:  Discussed  the following assessment and plan:    ICD-10-CM   1. Nausea  123456 Basic metabolic panel    CBC with Differential/Platelet    Hepatic function panel  2. Malaise  A999333 Basic metabolic panel    CBC with Differential/Platelet    Hepatic function panel  3. Personal history of covid-19  Z86.16  Always consider medicine but she tried off  For a few days    ? If couls be left over flare of covid   If not better next week then can get labs at  [facility] and go from there  [facility] to take  Antinausea med for now   Look pretty well and no toxic  Counseled.  Sees urologist next week on  Antibiotic macrobid  And now pessary for UI   Expectant management and discussion of plan and treatment with opportunity to ask questions and all were answered. The patient agreed with the plan and demonstrated an understanding of the instructions.  30 min review  And counsel gyne urology  Advised to call back or seek an in-person evaluation if worsening  or having  further concerns . Return for depending can get labs next week at [facility] if not getting better .    Shanon Ace, MD

## 2020-01-28 DIAGNOSIS — M4727 Other spondylosis with radiculopathy, lumbosacral region: Secondary | ICD-10-CM | POA: Diagnosis not present

## 2020-02-07 ENCOUNTER — Ambulatory Visit: Payer: Medicare PPO | Admitting: Physical Therapy

## 2020-02-07 DIAGNOSIS — M4156 Other secondary scoliosis, lumbar region: Secondary | ICD-10-CM | POA: Diagnosis not present

## 2020-02-07 DIAGNOSIS — M5136 Other intervertebral disc degeneration, lumbar region: Secondary | ICD-10-CM | POA: Diagnosis not present

## 2020-02-24 DIAGNOSIS — N8110 Cystocele, unspecified: Secondary | ICD-10-CM | POA: Diagnosis not present

## 2020-02-24 DIAGNOSIS — N3020 Other chronic cystitis without hematuria: Secondary | ICD-10-CM | POA: Diagnosis not present

## 2020-03-10 ENCOUNTER — Encounter: Payer: Self-pay | Admitting: Obstetrics & Gynecology

## 2020-03-10 ENCOUNTER — Ambulatory Visit: Payer: Medicare PPO | Admitting: Obstetrics & Gynecology

## 2020-03-10 ENCOUNTER — Telehealth: Payer: Self-pay

## 2020-03-10 ENCOUNTER — Other Ambulatory Visit: Payer: Self-pay

## 2020-03-10 VITALS — BP 102/68 | HR 68 | Resp 16 | Wt 100.0 lb

## 2020-03-10 DIAGNOSIS — N8111 Cystocele, midline: Secondary | ICD-10-CM

## 2020-03-10 DIAGNOSIS — R32 Unspecified urinary incontinence: Secondary | ICD-10-CM | POA: Diagnosis not present

## 2020-03-10 DIAGNOSIS — M4727 Other spondylosis with radiculopathy, lumbosacral region: Secondary | ICD-10-CM | POA: Diagnosis not present

## 2020-03-10 LAB — POCT URINALYSIS DIPSTICK
Bilirubin, UA: NEGATIVE
Blood, UA: NEGATIVE
Glucose, UA: NEGATIVE
Ketones, UA: NEGATIVE
Nitrite, UA: NEGATIVE
Protein, UA: NEGATIVE
Urobilinogen, UA: NEGATIVE E.U./dL — AB
pH, UA: 5 (ref 5.0–8.0)

## 2020-03-10 MED ORDER — NITROFURANTOIN MONOHYD MACRO 100 MG PO CAPS: 100.0000 mg | ORAL_CAPSULE | Freq: Two times a day (BID) | ORAL | 0 refills | Status: DC

## 2020-03-10 NOTE — Telephone Encounter (Signed)
Patient is calling in regards to bladder leaking with pessary.

## 2020-03-10 NOTE — Patient Instructions (Addendum)
oxytrol patch

## 2020-03-10 NOTE — Progress Notes (Signed)
GYNECOLOGY  VISIT  CC:   Urinary incontinence  HPI: 79 y.o. G1P0 Widowed White or Caucasian female here for urinary incontinence with pessary.  When pessary was initially placed in the spring, she had significant incontinence.  This gradually improved over a few weeks.  She has seen urology and myrbetriq was started.  This helped but she has horrible constipation with it.  She is now on gemtesa and it does not seem to be doing anything for her.  She is changing pads 10 times a day.  Very frustrating for her.  Denies dysuria.    poct urine-wbc tr  GYNECOLOGIC HISTORY: No LMP recorded (lmp unknown). Patient has had a hysterectomy. Contraception: hysterectomy Menopausal hormone therapy: none  Patient Active Problem List   Diagnosis Date Noted  . Incomplete bladder emptying 12/31/2019  . Midline cystocele 12/31/2019  . Spondylolisthesis at L5-S1 level 05/17/2019  . Tension-type headache, not intractable 06/11/2017  . Right leg weakness 06/11/2017  . Labile hypertension 09/27/2016  . Autonomic dysfunction 09/27/2016  . Chronic pain syndrome 09/10/2016  . Localization-related idiopathic epilepsy and epileptic syndromes with seizures of localized onset, not intractable, without status epilepticus ([place]) 08/01/2016  . Hyponatremia 04/01/2016  . Abdominal cramping 01/22/2016  . Nocturnal leg cramps 12/07/2015  . 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 Sacroiliitis ([place]) 06/06/2014  . Lumbar radicular pain 06/06/2014  . Osteoporosis 12/26/2013  . Glucosuria 12/16/2012  . Tick bite of upper arm 12/16/2012  . Back pain with radiation 06/12/2012  . Plantar wart of right foot 12/29/2011  . 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/11/2011  . Fatigue 11/11/2011  . 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 Medicare annual wellness visit, initial 12/18/2010  . Wears hearing aid 12/18/2010  . Back pain 11/23/2010  . Spinal cord tumor   . ABDOMINAL BRUIT  07/21/2009  . NEOPLASM, SKIN, UNCERTAIN BEHAVIOR 26/71/2458  . ANEMIA NOS 05/12/2007  . DISORDER, STOMACH FUNCTION NEC 04/07/2007    Past Medical History:  Diagnosis Date  . Anemia   . Arthritis   . Back pain   . Cancer ([place])    skin nose  . Depression    situational,due to pain  . Leg weakness   . Osteopenia   . PONV (postoperative nausea and vomiting)   . Right leg pain   . Seizures ([place])    05/13/2019- 4 years ago, "couldnt process" couldnt speak happened 2 times; none since  . 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  . Urinary tract infection   . Wears hearing aid     Past Surgical History:  Procedure Laterality Date  . ABDOMINAL EXPOSURE N/A 05/17/2019   Procedure: ABDOMINAL EXPOSURE;  Surgeon: Angelia Mould, MD;  Location: [facility];  Service: Vascular;  Laterality: N/A;  . ABDOMINAL HYSTERECTOMY  1973   1/2 of ovary left cyst on ovary  . ANTERIOR LUMBAR FUSION N/A 05/17/2019   Procedure: Lumbar five Sacral one  Anterior lumbar interbody fusion;  Surgeon: Kristeen Miss, MD;  Location: [facility];  Service: Neurosurgery;  Laterality: N/A;  . APPENDECTOMY     removed with hysterectomy  . COLONOSCOPY    . LUMBAR WOUND DEBRIDEMENT  12/06/2011   Procedure: LUMBAR WOUND DEBRIDEMENT;  Surgeon: Kristeen Miss, MD;  Location: [facility];  Service: Neurosurgery;  Laterality: N/A;  Repair of Pseudomeningocele Lumbar wound  . ROTATOR CUFF REPAIR     left   . shoulder rotator  culff  Left 2006  . spinal cord tumor  1987   2012 october     MEDS:   Current Outpatient Medications on File Prior to Visit  Medication Sig Dispense Refill  . Calcium Citrate-Vitamin D (CALCIUM + D PO) Take 2 tablets by mouth 2 (two) times daily.    . diclofenac sodium (VOLTAREN) 1 % GEL Apply 1 application topically 4 (four) times daily as needed (pain).    . Garlic 4665 MG CAPS Take 1,000 mg by mouth daily.    Marland Kitchen lamoTRIgine (LAMICTAL) 25 MG tablet Take 1  tablet (25 mg total) by mouth every evening. 90 tablet 3  . NONFORMULARY OR COMPOUNDED ITEM Vitamin E vaginal cream 200u/ml.  One ml pv twice weekly.  Disp. 3 month supply. 24 each 3  . traMADol (ULTRAM) 50 MG tablet Take 50 mg by mouth 3 (three) times daily.    . Vibegron (GEMTESA) 75 MG TABS Take by mouth.    . Vitamin D-Vitamin K (VITAMIN K2-VITAMIN D3 PO) Take 1 capsule by mouth daily.    . Meth-Hyo-M Bl-Na Phos-Ph Sal (URO-MP) 118 MG CAPS Take 118 mg by mouth daily as needed (UTI symptoms).  (Patient not taking: Reported on 03/10/2020)  3   No current facility-administered medications on file prior to visit.    ALLERGIES: Ibuprofen-famotidine, Fentanyl, Pollen extract-tree extract [pollen extract], Pregabalin, and Penicillins  Family History  Problem Relation Age of Onset  . Stroke Mother        2010  . Heart attack Father   . Cancer Paternal Grandmother     SH:  Widowed, non smoker  Review of Systems  Constitutional: Negative.   HENT: Negative.   Eyes: Negative.   Respiratory: Negative.   Cardiovascular: Negative.   Gastrointestinal: Negative.   Endocrine: Negative.   Genitourinary:       Urinary incontinence  Musculoskeletal: Negative.   Skin: Negative.   Allergic/Immunologic: Negative.   Neurological: Negative.   Psychiatric/Behavioral: Negative.     PHYSICAL EXAMINATION:    BP 102/68   Pulse 68   Resp 16   Wt 100 lb (45.4 kg)   LMP  (LMP Unknown)   BMI 18.89 kg/m     General appearance: alert, cooperative and appears stated age Lymph:  no inguinal LAD noted  Pelvic: External genitalia:  no lesions              Urethra:  normal appearing urethra with no masses, tenderness or lesions              Bartholins and Skenes: normal                 Vagina: normal appearing vagina with normal color and discharge, no lesions.  Pessary removed and cleansed and replaced.              Cervix: absent              Bimanual Exam:  Uterus:  uterus absent               Adnexa: no mass, fullness, tenderness  Chaperone, Royal Hawthorn, CMA, was present for exam.  Assessment: Urinary incontinence Pessary use  Plan: Urine culture and micro pending Macrobid 100mg  bid x 3 days Consider stopping Gemtesa and trying oxytrol patches.  Side effects discussed. Recheck pessary 3 months.

## 2020-03-10 NOTE — Telephone Encounter (Signed)
H/O midline cystocele  Incomplete bladder emptying that was requiring intermittent self catheterizations prior to pessary  #3 incontinence ring with support pessary use H/O urinary incontinence with neg UC on 12/2019  Spoke with pt. Pt states having urinary leaking with pessary. Pt states started last week intermittently , but worse yesterday with "flooding" and changed incontinence pad 10 times yesterday. Pt denies any vaginal bleeding/spotting, urinary urgency, bladder spasms/ abd pain, fever, chills, back pain.  Pt had pessary placed in April 2021. Pt has pessary recheck scheduled for 04/2020  Advised pt since having increased urinary leaking, OV for further evaluation is recommended. Pt agreeable. Pt scheduled for 7/9 at 145 pm. Pt offered other appts today, but cannot due to other appts already scheduled. Pt agreeable and verbalized understanding to date and time of appt. CPS neg.   Routing to Dr Sabra Heck for review Encounter closed.

## 2020-03-11 LAB — URINALYSIS, MICROSCOPIC ONLY
Bacteria, UA: NONE SEEN
Casts: NONE SEEN /lpf

## 2020-03-13 LAB — URINE CULTURE

## 2020-03-14 ENCOUNTER — Other Ambulatory Visit: Payer: Self-pay | Admitting: Neurological Surgery

## 2020-03-14 DIAGNOSIS — M4727 Other spondylosis with radiculopathy, lumbosacral region: Secondary | ICD-10-CM

## 2020-03-16 DIAGNOSIS — Z981 Arthrodesis status: Secondary | ICD-10-CM | POA: Diagnosis not present

## 2020-03-16 DIAGNOSIS — M48061 Spinal stenosis, lumbar region without neurogenic claudication: Secondary | ICD-10-CM | POA: Diagnosis not present

## 2020-03-22 ENCOUNTER — Ambulatory Visit: Payer: Medicare PPO | Admitting: Neurology

## 2020-03-22 ENCOUNTER — Other Ambulatory Visit: Payer: Self-pay

## 2020-03-22 ENCOUNTER — Encounter: Payer: Self-pay | Admitting: Neurology

## 2020-03-22 VITALS — BP 111/65 | HR 98 | Ht 61.0 in | Wt 98.2 lb

## 2020-03-22 DIAGNOSIS — G40009 Localization-related (focal) (partial) idiopathic epilepsy and epileptic syndromes with seizures of localized onset, not intractable, without status epilepticus: Secondary | ICD-10-CM | POA: Diagnosis not present

## 2020-03-22 MED ORDER — LAMOTRIGINE 25 MG PO TABS: 25.0000 mg | ORAL_TABLET | Freq: Every evening | ORAL | 3 refills | Status: DC

## 2020-03-22 NOTE — Progress Notes (Signed)
NEUROLOGY FOLLOW UP OFFICE NOTE  Kaitlyn Flowers [id number] 1941-04-09  HISTORY OF PRESENT ILLNESS: I had the pleasure of seeing Kaitlyn Flowers in follow-up in the neurology clinic on 03/22/2020.  The patient was last seen 6 months ago for seizures. She had a syncopal episode in 03/2016, 2 episodes of transient aphasia in 03/2016 and 07/2016. Stroke workup negative, her EEG in 2017 was abnormal with bialteral temporal slowing and left frontotemporal sharp waves. She has not tolerated higher doses of Lamotrigine, and takes low dose Lamotrigine 25mg  qhs with no further events since 2017. She denies any syncopal episodes or word-finding difficulties, no staring/unresponsive episodes, gaps in time, olfactory/gustatory hallucinations, myoclonic jerks. She has mostly been dealing with right leg pain, back surgery did not help, myelogram is planned. She has a right foot drop and uses a rollator for longer walks. She denies any falls. Her thinking gets fuzzy when pain is worse. Sleep is good. She denies any headaches, dizziness.   History On Initial Assessment 05/29/2016: This is a very pleasant 79 yo RH woman who presented after a syncopal episode on 03/03/16 then a transient episode of aphasia on 03/25/16. She lives alone and was sitting outside last 03/03/16. She recalls turning around to get water, then blacked out. She woke up on the ground and found her glasses broken, she hit the right side of her face. No tongue bite/incontinence. She denied any prodromal symptoms, no dizziness or headaches. She had been drinking a lot of water due to recurrent UTIs since January 2017. She went to the ER, it was felt fall was likely a result of orthostatic hypotension from sitting out in the heat. No bloodwork on record from that visit. Then on 03/25/16 she was reading something off her phone and could not make sense of it. She showed the phone to her friend who said it looked fine. Then she found she could not "get/grab my  words." Her friend drove her to the ER, she felt her friend was driving the wrong way and she was thinking this, but could not say it. This lasted 20-30 minutes, no associated focal numbness/tingling/weakness, headaches, or dizziness. Her BP on arrival was 139/72. I personally reviewed head CT without contrast which did not show any acute changes. She was found to have a sodium level of 123. She was also diagnosed with another UTI with >100,000 aerococcus urinae. She denies any further recurrence of symptoms. She denies any olfactory/gustatory hallucinations, deja vu, rising epigastric sensation, focal numbness/tingling/weakness, myoclonic jerks. She has chronic back pain and self-catheterizes herself every night. She has chronic right leg numbness and weakness. For the past 1-1/2 years, she has had pain the right sacral area, worsened by sitting. She continues to work with PT.   She underwent a stroke workup, I personally reviewed images available. 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 MRA head did not show any intracranial stenosis. Carotid dopplers did not show any evidence of ICA stenosis bilaterally. Her echocardiogram showed normal EF 60-65%, normal RV size and systolic function, no significant valvular abnormalities, left atrium normal size. Lipid panel showed an LDL of 114, total cholesterol 241. She started to take a daily baby aspirin.  She saw her PCP on 11/6 reporting random headaches and wondered if related to her BP. She was scheduled for an earlier appointment today, then yesterday had another transient episode of aphasia. She was working at [employer] for [employer],  looking at a card, then realized she could not see the words correctly. When she tried to speak, her words were jumbled. She states the aphasia lasted 30-40 minutes. A friend took her home then to her PCP office at noon, where bloodwork was done. She  went to the ER at 11pm, reporting severe headache that started an hour prior. She had an intense pressure and throbbing over the left temporal region, 8/10, with nausea and vomiting. Headache lasted until this morning. No photo/phono/phonophobia. There was no aphasia noted in the ER, headache was treated with IV fluids and APAP. She denied any focal weakness or paresthesias. She denied having a headache with the previous episode of aphasia. She has no prior history of migraines. Over the past 2 weeks, she has been having random headaches localized on different parts of her head, occurring every 3-4 days, resolving after Tylenol intake.   Her mother had a stroke. Otherwise she had a normal birth and early development.  There is no history of febrile convulsions, CNS infections such as meningitis/encephalitis, significant traumatic brain injury, neurosurgical procedures, or family history of seizures.  Diagnostic Data: EEG in 07/2016: This awake and asleep EEG is abnormal due to the presence of: 1. Independent focal slowing seen over the bilateral temporal regions, left greater than right 2. Rare epileptiform discharges over the left frontotemporal region  EEG in 09/2018:  This 1-hour awake and asleep EEG is abnormal due to the presence of: 1. Focal slowing over the bilateral temporal regions, left greater than right 2. Occasional epileptiform discharges over the left anterior temporal region   PAST MEDICAL HISTORY: Past Medical History:  Diagnosis Date  . Anemia   . Arthritis   . Back pain   . Cancer ([place])    skin nose  . Depression    situational,due to pain  . Leg weakness   . Osteopenia   . PONV (postoperative nausea and vomiting)   . Right leg pain   . Seizures ([place])    05/13/2019- 4 years ago, "couldnt process" couldnt speak happened 2 times; none since  . 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  . Urinary tract infection   . Wears  hearing aid     MEDICATIONS: Current Outpatient Medications on File Prior to Visit  Medication Sig Dispense Refill  . Calcium Citrate-Vitamin D (CALCIUM + D PO) Take 2 tablets by mouth 2 (two) times daily.    . diclofenac sodium (VOLTAREN) 1 % GEL Apply 1 application topically 4 (four) times daily as needed (pain).    . Garlic 7408 MG CAPS Take 1,000 mg by mouth daily.    Marland Kitchen lamoTRIgine (LAMICTAL) 25 MG tablet Take 1 tablet (25 mg total) by mouth every evening. 90 tablet 3  . Meth-Hyo-M Bl-Na Phos-Ph Sal (URO-MP) 118 MG CAPS Take 118 mg by mouth daily as needed (UTI symptoms).   3  . nitrofurantoin, macrocrystal-monohydrate, (MACROBID) 100 MG capsule Take 1 capsule (100 mg total) by mouth 2 (two) times daily. 10 capsule 0  . NONFORMULARY OR COMPOUNDED ITEM Vitamin E vaginal cream 200u/ml.  One ml pv twice weekly.  Disp. 3 month supply. 24 each 3  . traMADol (ULTRAM) 50 MG tablet Take 50 mg by mouth 3 (three) times daily.    . Vibegron (GEMTESA) 75 MG TABS Take by mouth.    . Vitamin D-Vitamin K (VITAMIN K2-VITAMIN D3 PO) Take 1 capsule by mouth daily.  No current facility-administered medications on file prior to visit.    ALLERGIES: Allergies  Allergen Reactions  . Ibuprofen-Famotidine Other (See Comments)    Elevated BP  . Fentanyl Other (See Comments)    Constipates for about 2 weeks  . Pollen Extract-Tree Extract [Pollen Extract]   . Pregabalin Other (See Comments)    AMS, Swelling  . Penicillins Rash    Did it involve swelling of the face/tongue/throat, SOB, or low BP? No Did it involve sudden or severe rash/hives, skin peeling, or any reaction on the inside of your mouth or nose? No Did you need to seek medical attention at a hospital or doctor's office? No When did it last happen?30+ years If all above answers are "NO", may proceed with cephalosporin use.     FAMILY HISTORY: Family History  Problem Relation Age of Onset  . Stroke Mother        2010  . Heart  attack Father   . Cancer Paternal Grandmother     SOCIAL HISTORY: Social History   Socioeconomic History  . Marital status: Widowed    Spouse name: Not on file  . Number of children: Not on file  . Years of education: Not on file  . Highest education level: Not on file  Occupational History  . Not on file  Tobacco Use  . Smoking status: Former Smoker    Packs/day: 0.25    Years: 2.00    Pack years: 0.50    Types: Cigarettes    Quit date: 09/03/1967    Years since quitting: 52.5  . Smokeless tobacco: Never Used  . Tobacco comment: quit 48 years   Vaping Use  . Vaping Use: Never used  Substance and Sexual Activity  . Alcohol use: Yes    Alcohol/week: 4.0 standard drinks    Types: 4 Glasses of wine per week    Comment: 4 a week  . Drug use: No  . Sexual activity: Not Currently    Partners: Male    Birth control/protection: Surgical    Comment: hysterectomy  Other Topics Concern  . 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 Difficulty of Paying Living Expenses:   Food Insecurity:   . Worried About Charity fundraiser in the Last Year:   . Arboriculturist in the Last Year:   Transportation Needs:   . Film/video editor (Medical):   Marland Kitchen Lack of Transportation (Non-Medical):   Physical Activity:   . Days of Exercise per Week:   . Minutes of Exercise per Session:   Stress:   . Feeling of Stress :   Social Connections:   . Frequency of Communication with Friends and Family:   . Frequency of Social Gatherings with Friends and Family:   . Attends Religious Services:   . Active Member of Clubs or Organizations:   . Attends Archivist Meetings:   Marland Kitchen Marital Status:   Intimate Partner Violence:   . Fear of Current or Ex-Partner:   .  Emotionally Abused:   Marland Kitchen Physically Abused:   . Sexually Abused:     PHYSICAL EXAM: Vitals:   03/22/20 1126  BP: 111/65  Pulse: 98  SpO2: 100%   General:  No acute distress Head:  Normocephalic/atraumatic Skin/Extremities: No rash, no edema Neurological Exam: alert and oriented to person, place, and time. No aphasia or dysarthria. Fund of knowledge is appropriate.  Recent and remote memory are intact.  Attention and concentration are normal. Cranial nerves: Pupils equal, round, reactive to light. Extraocular movements intact with no nystagmus. Visual fields full.No facial asymmetry. Motor: Bulk and tone normal, muscle strength 5/5 throughout except for right foot drop 3+/5 right foot dorsiflexion. Deep tendon reflexes +2 on right UE, +1 left UE, +2 left LE, unable to elicit on right LE. Finger to nose testing intact.  Gait slow and cautious with steppage gait due to right foot drop. She has an occasional resting tremor on the right thumb when standing.    IMPRESSION: This is a pleasant 79 yo RH woman who had a syncopal episode on 03/03/16, then a transient episode of expressive aphasia last 03/25/16. Stroke workup was unremarkable, MRI brain did not show any acute changes, no significant stenosis seen intracranially or with carotid dopplers. Echo normal. She had another transient episode of aphasia on 07/21/16, this time followed by intense headache. No headache with previous event. Her EEG was abnormal with bilateral temporal slowing and rare left frontotemporal epileptiform discharges. 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 Repeat EEG in 09/2018 showed similar findings. She has not had any further seizures since 2017 on very low dose Lamotrigine 25mg  qhs, she did not tolerate higher doses. Since no events, we have agreed to stay on low dose instead of switching to a different AED. Continue follow-up with Neurosurgery for back pain. She is  aware of [place] driving laws to stop driving after a seizure until 6 months seizure-free. Follow-up in 1 year, she knows to call for any changes.   Thank you for allowing me to participate in her care.  Please do not hesitate to call for any questions or concerns.   Ellouise Newer, M.D.   CC: Dr. Regis Bill

## 2020-03-22 NOTE — Patient Instructions (Signed)
Always good to see you! Continue Lamictal 25mg  daily. Wishing you well as you continue to work with Dr. Ellene Route. Follow-up in 1 year, call for any changes  Seizure Precautions: 1. If medication has been prescribed for you to prevent seizures, take it exactly as directed.  Do not stop taking the medicine without talking to your doctor first, even if you have not had a seizure in a long time.   2. Avoid activities in which a seizure would cause danger to yourself or to others.  Don't operate dangerous machinery, swim alone, or climb in high or dangerous places, such as on ladders, roofs, or girders.  Do not drive unless your doctor says you may.  3. If you have any warning that you may have a seizure, lay down in a safe place where you can't hurt yourself.    4.  No driving for 6 months from last seizure, as per [facility].   Please refer to the following link on the [place] website for more information: [email or web address]   5.  Maintain good sleep hygiene.   6.  Contact your doctor if you have any problems that may be related to the medicine you are taking.  7.  Call 911 and bring the patient back to the ED if:        A.  The seizure lasts longer than 5 minutes.       B.  The patient doesn't awaken shortly after the seizure  C.  The patient has new problems such as difficulty seeing, speaking or moving  D.  The patient was injured during the seizure  E.  The patient has a temperature over 102 F (39C)  F.  The patient vomited and now is having trouble breathing

## 2020-03-29 ENCOUNTER — Other Ambulatory Visit: Payer: Self-pay

## 2020-03-29 ENCOUNTER — Encounter: Payer: Self-pay | Admitting: Internal Medicine

## 2020-03-29 ENCOUNTER — Telehealth: Payer: Self-pay | Admitting: Internal Medicine

## 2020-03-29 ENCOUNTER — Ambulatory Visit: Payer: Medicare PPO | Admitting: Internal Medicine

## 2020-03-29 VITALS — BP 122/68 | HR 75 | Temp 98.7°F | Ht 61.0 in | Wt 101.2 lb

## 2020-03-29 DIAGNOSIS — R6881 Early satiety: Secondary | ICD-10-CM | POA: Diagnosis not present

## 2020-03-29 DIAGNOSIS — R109 Unspecified abdominal pain: Secondary | ICD-10-CM

## 2020-03-29 DIAGNOSIS — R634 Abnormal weight loss: Secondary | ICD-10-CM

## 2020-03-29 DIAGNOSIS — Z87440 Personal history of urinary (tract) infections: Secondary | ICD-10-CM

## 2020-03-29 DIAGNOSIS — R110 Nausea: Secondary | ICD-10-CM | POA: Diagnosis not present

## 2020-03-29 LAB — POCT URINALYSIS DIPSTICK
Bilirubin, UA: NEGATIVE
Blood, UA: NEGATIVE
Glucose, UA: NEGATIVE
Ketones, UA: NEGATIVE
Leukocytes, UA: NEGATIVE
Nitrite, UA: NEGATIVE
Protein, UA: POSITIVE — AB
Spec Grav, UA: 1.025 (ref 1.010–1.025)
Urobilinogen, UA: 0.2 E.U./dL
pH, UA: 5.5 (ref 5.0–8.0)

## 2020-03-29 NOTE — Progress Notes (Signed)
Chief Complaint  Patient presents with  . Nausea    started when her abdomen started hurting, worse in later part of day but all day  . Abdominal Pain    Left side of abdomen, going off and on for a while, worse within the last few weeks    HPI: Kaitlyn Flowers 79 y.o. come in for  increasing nausea   And recently  Left abd tenderness  Prolapsed bladder  and leaking   Not seeming like uti . gimtessa  Samples  Were helpful    But too expensive.    Nausea  And some days ok  But then  dont want to eat and now feels full but not  previously had    and left side tenderness luq   About a week.   Has had ok times .  Has lost some weight .  Also  No dysphagia  No hx pud asa advil nsiads reg use  ROS: See pertinent positives and negatives per HPI.  Past Medical History:  Diagnosis Date  . Anemia   . Arthritis   . Back pain   . Cancer ([place])    skin nose  . Depression    situational,due to pain  . Leg weakness   . Osteopenia   . PONV (postoperative nausea and vomiting)   . Right leg pain   . Seizures ([place])    05/13/2019- 4 years ago, "couldnt process" couldnt speak happened 2 times; none since  . 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  . Urinary tract infection   . Wears hearing aid     Family History  Problem Relation Age of Onset  . Stroke Mother        2010  . Heart attack Father   . Cancer Paternal Grandmother     Social History   Socioeconomic History  . Marital status: Widowed    Spouse name: Not on file  . Number of children: Not on file  . Years of education: Not on file  . Highest education level: Not on file  Occupational History  . Not on file  Tobacco Use  . Smoking status: Former Smoker    Packs/day: 0.25    Years: 2.00    Pack years: 0.50    Types: Cigarettes    Quit date: 09/03/1967    Years since quitting: 52.6  . Smokeless tobacco: Never Used  . Tobacco comment: quit 48 years   Vaping Use  .  Vaping Use: Never used  Substance and Sexual Activity  . Alcohol use: Yes    Alcohol/week: 4.0 standard drinks    Types: 4 Glasses of wine per week    Comment: 4 a week  . Drug use: No  . Sexual activity: Not Currently    Partners: Male    Birth control/protection: Surgical    Comment: hysterectomy  Other Topics Concern  . 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 Difficulty of Paying Living Expenses:   Food Insecurity:   . Worried About Charity fundraiser in the Last Year:   . Arboriculturist in the Last Year:   Transportation Needs:   .  Lack of Transportation (Medical):   Marland Kitchen Lack of Transportation (Non-Medical):   Physical Activity:   . Days of Exercise per Week:   . Minutes of Exercise per Session:   Stress:   . Feeling of Stress :   Social Connections:   . Frequency of Communication with Friends and Family:   . Frequency of Social Gatherings with Friends and Family:   . Attends Religious Services:   . Active Member of Clubs or Organizations:   . Attends Archivist Meetings:   Marland Kitchen Marital Status:     Outpatient Medications Prior to Visit  Medication Sig Dispense Refill  . Calcium Citrate-Vitamin D (CALCIUM + D PO) Take 2 tablets by mouth 2 (two) times daily.    . Garlic 0947 MG CAPS Take 1,000 mg by mouth daily.    Marland Kitchen lamoTRIgine (LAMICTAL) 25 MG tablet Take 1 tablet (25 mg total) by mouth every evening. 90 tablet 3  . MAGNESIUM PO Take by mouth. Takes a liquid form once daily    . Meth-Hyo-M Bl-Na Phos-Ph Sal (URO-MP) 118 MG CAPS Take 118 mg by mouth daily as needed (UTI symptoms).   3  . NONFORMULARY OR COMPOUNDED ITEM Vitamin E vaginal cream 200u/ml.  One ml pv twice weekly.  Disp. 3 month supply. 24 each 3  . traMADol (ULTRAM) 50 MG  tablet Take 50 mg by mouth 3 (three) times daily.    . Vibegron (GEMTESA) 75 MG TABS Take by mouth.    . Vitamin D-Vitamin K (VITAMIN K2-VITAMIN D3 PO) Take 1 capsule by mouth daily.    . diclofenac sodium (VOLTAREN) 1 % GEL Apply 1 application topically 4 (four) times daily as needed (pain). (Patient not taking: Reported on 03/29/2020)    . nitrofurantoin, macrocrystal-monohydrate, (MACROBID) 100 MG capsule Take 1 capsule (100 mg total) by mouth 2 (two) times daily. (Patient not taking: Reported on 03/29/2020) 10 capsule 0   No facility-administered medications prior to visit.     EXAM:  BP 122/68   Pulse 75   Temp 98.7 F (37.1 C) (Oral)   Ht 5\' 1"  (1.549 m)   Wt 101 lb 3.2 oz (45.9 kg)   LMP  (LMP Unknown)   SpO2 98%   BMI 19.12 kg/m   Body mass index is 19.12 kg/m. Wt Readings from Last 3 Encounters:  03/29/20 101 lb 3.2 oz (45.9 kg)  03/22/20 98 lb 3.2 oz (44.5 kg)  03/10/20 100 lb (45.4 kg)    GENERAL: vitals reviewed and listed above, alert, oriented, appears well hydrated and in no acute distress HEENT: atraumatic, conjunctiva  clear, no obvious abnormalities on inspection of external nose and ears OP : masked  NECK: no obvious masses on inspection palpation  LUNGS: clear to auscultation bilaterally, no wheezes, rales or rhonchi, good air movement CV: HRRR, no clubbing cyanosis or  peripheral edema nl cap refill  Abdomen:  Sof,t normal bowel sounds without hepatosplenomegaly, tender  llq  Some guard on deep palpation  no rebound or masses no CVA tenderness MS: moves all extremities without noticeable focal  abnormality PSYCH: pleasant and cooperative, no obvious depression or anxiety Lab Results  Component Value Date   WBC 10.1 09/18/2019   HGB 11.5 (L) 09/18/2019   HCT 33.2 (L) 09/18/2019   PLT 228 09/18/2019   GLUCOSE 103 (H) 09/29/2019   CHOL 241 (H) 04/02/2016   TRIG 78.0 04/02/2016   HDL 111.30 04/02/2016   LDLDIRECT 110.1 11/27/2010   LDLCALC 114 (H)  04/02/2016   ALT 14 07/22/2018   AST 23 07/22/2018   NA 136 09/29/2019   K 4.4 09/29/2019   CL 98 09/29/2019   CREATININE 0.81 09/29/2019   BUN 17 09/29/2019   CO2 28 09/29/2019   TSH 0.83 09/26/2017   INR 1.01 03/25/2016   BP Readings from Last 3 Encounters:  03/29/20 122/68  03/22/20 111/65  03/10/20 102/68    ASSESSMENT AND PLAN:  Discussed the following assessment and plan:  Left sided abdominal pain - Plan: Basic metabolic panel, CBC with Differential/Platelet, Hepatic function panel, Sedimentation rate, CT Abdomen Pelvis W Contrast  Hx: UTI (urinary tract infection) - Plan: POCT urinalysis dipstick, Basic metabolic panel, CBC with Differential/Platelet, Hepatic function panel, Sedimentation rate, CT Abdomen Pelvis W Contrast  Nausea - Plan: Basic metabolic panel, CBC with Differential/Platelet, Hepatic function panel, Sedimentation rate, CT Abdomen Pelvis W Contrast  Weight loss - Plan: Basic metabolic panel, CBC with Differential/Platelet, Hepatic function panel, Sedimentation rate, CT Abdomen Pelvis W Contrast  Early satiety - Plan: CT Abdomen Pelvis W Contrast Plan abd pelvic ct and labs    To ED  if worse  consider  diverticulitis other   .   -Patient advised to return or notify health care team  if  new concerns arise.  Patient Instructions  Ordering labs .  Get appt here or go to [facility]  Ordering stat ct scan hopefully  for tomorrow  Checking for diverticulitis    . If pain worse seek ed care.   In interim try adding  otc prilosec     And plan  To have dr Buccini to see you. depending on  Results and how doing   Standley Brooking. Lakiyah Arntson M.D.

## 2020-03-29 NOTE — Patient Instructions (Signed)
Ordering labs .  Get appt here or go to [facility]  Ordering stat ct scan hopefully  for tomorrow  Checking for diverticulitis    . If pain worse seek ed care.   In interim try adding  otc prilosec     And plan  To have dr Buccini to see you. depending on  Results and how doing

## 2020-03-29 NOTE — Telephone Encounter (Signed)
Please refer to pt question and advise

## 2020-03-29 NOTE — Telephone Encounter (Signed)
Patient requests to be called at ph# [phone] re: Patient's last Prolia injection was on 11/30/19, patient wants to know when she is due to have her next Prolia injection.

## 2020-03-29 NOTE — Telephone Encounter (Signed)
Called pt and informed her that she is not due for her next injection until 06/02/20, and she will be contacted to schedule this when the time comes. Pt verbalized understanding.

## 2020-03-30 ENCOUNTER — Ambulatory Visit (INDEPENDENT_AMBULATORY_CARE_PROVIDER_SITE_OTHER)
Admission: RE | Admit: 2020-03-30 | Discharge: 2020-03-30 | Disposition: A | Discharge status: Home / Self Care | Payer: Medicare PPO | Source: Ambulatory Visit | Attending: Internal Medicine | Admitting: Internal Medicine

## 2020-03-30 ENCOUNTER — Telehealth: Payer: Self-pay

## 2020-03-30 ENCOUNTER — Other Ambulatory Visit (INDEPENDENT_AMBULATORY_CARE_PROVIDER_SITE_OTHER): Payer: Medicare PPO

## 2020-03-30 DIAGNOSIS — R634 Abnormal weight loss: Secondary | ICD-10-CM

## 2020-03-30 DIAGNOSIS — R110 Nausea: Secondary | ICD-10-CM

## 2020-03-30 DIAGNOSIS — Z87440 Personal history of urinary (tract) infections: Secondary | ICD-10-CM | POA: Diagnosis not present

## 2020-03-30 DIAGNOSIS — R109 Unspecified abdominal pain: Secondary | ICD-10-CM

## 2020-03-30 DIAGNOSIS — R6881 Early satiety: Secondary | ICD-10-CM

## 2020-03-30 DIAGNOSIS — N281 Cyst of kidney, acquired: Secondary | ICD-10-CM | POA: Diagnosis not present

## 2020-03-30 LAB — CBC WITH DIFFERENTIAL/PLATELET
Basophils Absolute: 0.1 10*3/uL (ref 0.0–0.1)
Basophils Relative: 0.8 % (ref 0.0–3.0)
Eosinophils Absolute: 0 10*3/uL (ref 0.0–0.7)
Eosinophils Relative: 0.6 % (ref 0.0–5.0)
HCT: 32.5 % — ABNORMAL LOW (ref 36.0–46.0)
Hemoglobin: 10.9 g/dL — ABNORMAL LOW (ref 12.0–15.0)
Lymphocytes Relative: 30.9 % (ref 12.0–46.0)
Lymphs Abs: 2 10*3/uL (ref 0.7–4.0)
MCHC: 33.7 g/dL (ref 30.0–36.0)
MCV: 101.1 fl — ABNORMAL HIGH (ref 78.0–100.0)
Monocytes Absolute: 0.4 10*3/uL (ref 0.1–1.0)
Monocytes Relative: 5.8 % (ref 3.0–12.0)
Neutro Abs: 4 10*3/uL (ref 1.4–7.7)
Neutrophils Relative %: 61.9 % (ref 43.0–77.0)
Platelets: 255 10*3/uL (ref 150.0–400.0)
RBC: 3.21 Mil/uL — ABNORMAL LOW (ref 3.87–5.11)
RDW: 14.5 % (ref 11.5–15.5)
WBC: 6.5 10*3/uL (ref 4.0–10.5)

## 2020-03-30 LAB — HEPATIC FUNCTION PANEL
ALT: 18 U/L (ref 0–35)
AST: 22 U/L (ref 0–37)
Albumin: 4.1 g/dL (ref 3.5–5.2)
Alkaline Phosphatase: 30 U/L — ABNORMAL LOW (ref 39–117)
Bilirubin, Direct: 0.1 mg/dL (ref 0.0–0.3)
Total Bilirubin: 0.5 mg/dL (ref 0.2–1.2)
Total Protein: 6.4 g/dL (ref 6.0–8.3)

## 2020-03-30 LAB — BASIC METABOLIC PANEL
BUN: 22 mg/dL (ref 6–23)
CO2: 30 mEq/L (ref 19–32)
Calcium: 8.9 mg/dL (ref 8.4–10.5)
Chloride: 102 mEq/L (ref 96–112)
Creatinine, Ser: 0.82 mg/dL (ref 0.40–1.20)
GFR: 67.43 mL/min (ref >60.00)
Glucose, Bld: 94 mg/dL (ref 70–99)
Potassium: 4.3 mEq/L (ref 3.5–5.1)
Sodium: 136 mEq/L (ref 135–145)

## 2020-03-30 LAB — SEDIMENTATION RATE: Sed Rate: 10 mm/hr (ref 0–30)

## 2020-03-30 MED ORDER — IOHEXOL 300 MG/ML  SOLN
100.0000 mL | Freq: Once | INTRAMUSCULAR | Status: AC | PRN
  Administered 2020-03-30: 100 mL via INTRAVENOUS

## 2020-03-30 NOTE — Progress Notes (Signed)
Kidney liver ok you are still  anemia  and will need fu  with gi but lets get the  ct scan first

## 2020-03-30 NOTE — Telephone Encounter (Signed)
Called patient after 8am this morning and left a detailed voice message for patient to go to [facility] lab to have her labs done and to go by [facility] to pick up the drink for her CT scan before noon today. I advised that patient needed to have her labs done before they will do her CT scan.

## 2020-03-30 NOTE — Addendum Note (Signed)
Addended by: Marrion Coy on: 03/30/2020 08:52 AM   Modules accepted: Orders

## 2020-03-31 NOTE — Progress Notes (Signed)
Good news  no masses and no bowel abnormalities seen  ovaries ok  Stool in colon sometimes with constipation could cause  left sided pain .  Please refer to her GI  doctor dr Cristina Gong  about the nausea and weigh toss

## 2020-04-03 ENCOUNTER — Ambulatory Visit: Payer: Medicare PPO | Admitting: Obstetrics & Gynecology

## 2020-04-03 DIAGNOSIS — K5901 Slow transit constipation: Secondary | ICD-10-CM | POA: Diagnosis not present

## 2020-04-03 DIAGNOSIS — D649 Anemia, unspecified: Secondary | ICD-10-CM | POA: Diagnosis not present

## 2020-04-03 DIAGNOSIS — R109 Unspecified abdominal pain: Secondary | ICD-10-CM | POA: Diagnosis not present

## 2020-04-03 DIAGNOSIS — R634 Abnormal weight loss: Secondary | ICD-10-CM | POA: Diagnosis not present

## 2020-04-05 ENCOUNTER — Ambulatory Visit (HOSPITAL_COMMUNITY): Payer: Medicare PPO

## 2020-04-05 ENCOUNTER — Ambulatory Visit (HOSPITAL_COMMUNITY): Admission: RE | Admit: 2020-04-05 | Payer: Medicare PPO | Source: Ambulatory Visit

## 2020-04-17 DIAGNOSIS — D649 Anemia, unspecified: Secondary | ICD-10-CM | POA: Diagnosis not present

## 2020-04-20 DIAGNOSIS — N3020 Other chronic cystitis without hematuria: Secondary | ICD-10-CM | POA: Diagnosis not present

## 2020-04-20 DIAGNOSIS — N3941 Urge incontinence: Secondary | ICD-10-CM | POA: Diagnosis not present

## 2020-04-20 DIAGNOSIS — N312 Flaccid neuropathic bladder, not elsewhere classified: Secondary | ICD-10-CM | POA: Diagnosis not present

## 2020-04-26 ENCOUNTER — Encounter (HOSPITAL_COMMUNITY): Payer: Self-pay

## 2020-04-26 ENCOUNTER — Ambulatory Visit (HOSPITAL_COMMUNITY): Admission: RE | Admit: 2020-04-26 | Payer: Medicare PPO | Source: Ambulatory Visit

## 2020-04-26 ENCOUNTER — Other Ambulatory Visit (HOSPITAL_COMMUNITY): Payer: Medicare PPO

## 2020-04-27 DIAGNOSIS — Z1159 Encounter for screening for other viral diseases: Secondary | ICD-10-CM | POA: Diagnosis not present

## 2020-05-02 DIAGNOSIS — R1012 Left upper quadrant pain: Secondary | ICD-10-CM | POA: Diagnosis not present

## 2020-05-02 DIAGNOSIS — K5904 Chronic idiopathic constipation: Secondary | ICD-10-CM | POA: Diagnosis not present

## 2020-05-02 DIAGNOSIS — R933 Abnormal findings on diagnostic imaging of other parts of digestive tract: Secondary | ICD-10-CM | POA: Diagnosis not present

## 2020-05-02 DIAGNOSIS — K449 Diaphragmatic hernia without obstruction or gangrene: Secondary | ICD-10-CM | POA: Diagnosis not present

## 2020-05-02 DIAGNOSIS — D120 Benign neoplasm of cecum: Secondary | ICD-10-CM | POA: Diagnosis not present

## 2020-05-02 DIAGNOSIS — D122 Benign neoplasm of ascending colon: Secondary | ICD-10-CM | POA: Diagnosis not present

## 2020-05-03 DIAGNOSIS — D2271 Melanocytic nevi of right lower limb, including hip: Secondary | ICD-10-CM | POA: Diagnosis not present

## 2020-05-03 DIAGNOSIS — L814 Other melanin hyperpigmentation: Secondary | ICD-10-CM | POA: Diagnosis not present

## 2020-05-03 DIAGNOSIS — D649 Anemia, unspecified: Secondary | ICD-10-CM | POA: Diagnosis not present

## 2020-05-03 DIAGNOSIS — D225 Melanocytic nevi of trunk: Secondary | ICD-10-CM | POA: Diagnosis not present

## 2020-05-03 DIAGNOSIS — L821 Other seborrheic keratosis: Secondary | ICD-10-CM | POA: Diagnosis not present

## 2020-05-03 DIAGNOSIS — Z85828 Personal history of other malignant neoplasm of skin: Secondary | ICD-10-CM | POA: Diagnosis not present

## 2020-05-03 DIAGNOSIS — L718 Other rosacea: Secondary | ICD-10-CM | POA: Diagnosis not present

## 2020-05-03 DIAGNOSIS — L570 Actinic keratosis: Secondary | ICD-10-CM | POA: Diagnosis not present

## 2020-05-03 DIAGNOSIS — D2272 Melanocytic nevi of left lower limb, including hip: Secondary | ICD-10-CM | POA: Diagnosis not present

## 2020-05-03 DIAGNOSIS — L820 Inflamed seborrheic keratosis: Secondary | ICD-10-CM | POA: Diagnosis not present

## 2020-05-05 ENCOUNTER — Telehealth: Payer: Self-pay

## 2020-05-05 DIAGNOSIS — D120 Benign neoplasm of cecum: Secondary | ICD-10-CM | POA: Diagnosis not present

## 2020-05-05 DIAGNOSIS — D122 Benign neoplasm of ascending colon: Secondary | ICD-10-CM | POA: Diagnosis not present

## 2020-05-05 NOTE — Telephone Encounter (Signed)
Pt has been submitted to [facility] Portal for Prolia verification of benefits.

## 2020-05-11 DIAGNOSIS — M48061 Spinal stenosis, lumbar region without neurogenic claudication: Secondary | ICD-10-CM | POA: Diagnosis not present

## 2020-05-11 DIAGNOSIS — Z981 Arthrodesis status: Secondary | ICD-10-CM | POA: Diagnosis not present

## 2020-05-11 DIAGNOSIS — M5416 Radiculopathy, lumbar region: Secondary | ICD-10-CM | POA: Diagnosis not present

## 2020-05-11 NOTE — Telephone Encounter (Signed)
Summary of benefits indicates that pt does have coverage for Prolia, but it does require a PA. Once approved, pt will owe $40 for medication and administration. She can be scheduled anytime on or after 06/02/20

## 2020-05-17 NOTE — Telephone Encounter (Signed)
PA has been initiated via [email or web address] for Prolia.   Kaitlyn Flowers  Key: [id number]   PA Case ID: [id number] Need help? Call us at [phone] Outcome: Additional Information Required There is an existing case within the [name] environment that has the same patient, prescriber, and drug. This case must be finalized before proceeding with similar requests. Drug Prolia 60MG /ML syringes Form Nurse, adult and Medical Benefit PA Form  Mcarthur Rossetti is unable to respond with clinical questions. Please see more information at the bottom of the page for next steps.

## 2020-05-29 DIAGNOSIS — R109 Unspecified abdominal pain: Secondary | ICD-10-CM | POA: Diagnosis not present

## 2020-05-29 DIAGNOSIS — D649 Anemia, unspecified: Secondary | ICD-10-CM | POA: Diagnosis not present

## 2020-05-29 DIAGNOSIS — R634 Abnormal weight loss: Secondary | ICD-10-CM | POA: Diagnosis not present

## 2020-05-29 DIAGNOSIS — R110 Nausea: Secondary | ICD-10-CM | POA: Diagnosis not present

## 2020-05-29 DIAGNOSIS — K5901 Slow transit constipation: Secondary | ICD-10-CM | POA: Diagnosis not present

## 2020-06-07 DIAGNOSIS — Z981 Arthrodesis status: Secondary | ICD-10-CM | POA: Diagnosis not present

## 2020-06-07 DIAGNOSIS — M5416 Radiculopathy, lumbar region: Secondary | ICD-10-CM | POA: Diagnosis not present

## 2020-06-12 NOTE — Progress Notes (Signed)
GYNECOLOGY  VISIT  CC:   Pessary check  HPI: 79 y.o. G1P0 Widowed White or Caucasian female here for 53mth pessary recheck.  She's started [place].  This is really working well for her.  It is causing her dry mouth.  She has not done any physical therapy at this point.  She is ready to start this now.  Denies vaginal bleeding.  Feels, even with the [facility], she still leaks more than she should.  She's taken the medication at different times of the day and this has not really made a difference.  Does have follow up with Dr. Matilde Sprang next month.  GYNECOLOGIC HISTORY: No LMP recorded (lmp unknown). Patient has had a hysterectomy. Contraception: hysterectomy Menopausal hormone therapy: none  Patient Active Problem List   Diagnosis Date Noted  . Incomplete bladder emptying 12/31/2019  . Midline cystocele 12/31/2019  . Spondylolisthesis at L5-S1 level 05/17/2019  . Tension-type headache, not intractable 06/11/2017  . Right leg weakness 06/11/2017  . Labile hypertension 09/27/2016  . Autonomic dysfunction 09/27/2016  . Chronic pain syndrome 09/10/2016  . Localization-related idiopathic epilepsy and epileptic syndromes with seizures of localized onset, not intractable, without status epilepticus ([place]) 08/01/2016  . Hyponatremia 04/01/2016  . Abdominal cramping 01/22/2016  . Nocturnal leg cramps 12/07/2015  . 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 Sacroiliitis ([place]) 06/06/2014  . Lumbar radicular pain 06/06/2014  . Osteoporosis 12/26/2013  . Glucosuria 12/16/2012  . Tick bite of upper arm 12/16/2012  . Back pain with radiation 06/12/2012  . Plantar wart of right foot 12/29/2011  . 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/11/2011  . Fatigue 11/11/2011  . 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 Medicare annual wellness visit, initial 12/18/2010  . Wears hearing aid 12/18/2010  . Back pain 11/23/2010  . Spinal cord tumor   . ABDOMINAL BRUIT  07/21/2009  . NEOPLASM, SKIN, UNCERTAIN BEHAVIOR 79/39/0300  . ANEMIA NOS 05/12/2007  . DISORDER, STOMACH FUNCTION NEC 04/07/2007    Past Medical History:  Diagnosis Date  . Anemia   . Arthritis   . Back pain   . Cancer ([place])    skin nose  . Depression    situational,due to pain  . Leg weakness   . Osteopenia   . PONV (postoperative nausea and vomiting)   . Right leg pain   . Seizures ([place])    05/13/2019- 4 years ago, "couldnt process" couldnt speak happened 2 times; none since  . 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  . Urinary tract infection   . Wears hearing aid     Past Surgical History:  Procedure Laterality Date  . ABDOMINAL EXPOSURE N/A 05/17/2019   Procedure: ABDOMINAL EXPOSURE;  Surgeon: Angelia Mould, MD;  Location: [facility];  Service: Vascular;  Laterality: N/A;  . ABDOMINAL HYSTERECTOMY  1973   1/2 of ovary left cyst on ovary  . ANTERIOR LUMBAR FUSION N/A 05/17/2019   Procedure: Lumbar five Sacral one  Anterior lumbar interbody fusion;  Surgeon: Kristeen Miss, MD;  Location: [facility];  Service: Neurosurgery;  Laterality: N/A;  . APPENDECTOMY     removed with hysterectomy  . COLONOSCOPY    . LUMBAR WOUND DEBRIDEMENT  12/06/2011   Procedure: LUMBAR WOUND DEBRIDEMENT;  Surgeon: Kristeen Miss, MD;  Location: [facility];  Service: Neurosurgery;  Laterality: N/A;  Repair of Pseudomeningocele Lumbar wound  . ROTATOR CUFF REPAIR     left   .  shoulder rotator culff  Left 2006  . spinal cord tumor  1987   2012 october     MEDS:   Current Outpatient Medications on File Prior to Visit  Medication Sig Dispense Refill  . Acetylcysteine (N-ACETYL-L-CYSTEINE PO) Take 1 tablet by mouth daily.    Marland Kitchen CALCIUM PO Take 2.5 mLs by mouth daily. Liquid calcium    . diclofenac sodium (VOLTAREN) 1 % GEL Apply 1 application topically 4 (four) times daily as needed (pain).     Marland Kitchen GARLIC PO Take 1 capsule by mouth daily.    Marland Kitchen  lamoTRIgine (LAMICTAL) 25 MG tablet Take 1 tablet (25 mg total) by mouth every evening. 90 tablet 3  . MAGNESIUM PO Take 2.5 mLs by mouth daily. Takes a liquid form    . Multiple Vitamins-Minerals (ZINC PO) Take 2.5 mLs by mouth daily. Liquid zinc    . NONFORMULARY OR COMPOUNDED ITEM Vitamin E vaginal cream 200u/ml.  One ml pv twice weekly.  Disp. 3 month supply. (Patient taking differently: Place 1 mL vaginally 2 (two) times a week. Vitamin E vaginal cream 200u/ml.   Disp. 3 month supply.) 24 each 3  . Omega-3 Fatty Acids (OMEGA 3 PO) Take 1 capsule by mouth 2 (two) times daily.    Marland Kitchen pyridoxine (B-6) 200 MG tablet Take 200 mg by mouth daily.    . traMADol (ULTRAM) 50 MG tablet Take 50 mg by mouth 2 (two) times daily.     . Vibegron (GEMTESA) 75 MG TABS Take 75 mg by mouth every evening.     . Vitamin D-Vitamin K (VITAMIN K2-VITAMIN D3 PO) Take 1 capsule by mouth daily.    . Meth-Hyo-M Bl-Na Phos-Ph Sal (URO-MP) 118 MG CAPS Take 118 mg by mouth daily as needed (UTI symptoms).  (Patient not taking: Reported on 06/16/2020)  3   No current facility-administered medications on file prior to visit.    ALLERGIES: Ibuprofen-famotidine, Fentanyl, Pollen extract-tree extract [pollen extract], Pregabalin, and Penicillins  Family History  Problem Relation Age of Onset  . Stroke Mother        2010  . Heart attack Father   . Cancer Paternal Grandmother     SH:  Widowed, non smoker  Review of Systems  Constitutional: Negative.   HENT: Negative.   Eyes: Negative.   Respiratory: Negative.   Cardiovascular: Negative.   Gastrointestinal: Negative.   Endocrine: Negative.   Genitourinary: Negative.   Musculoskeletal: Negative.   Skin: Negative.   Allergic/Immunologic: Negative.   Neurological: Negative.   Hematological: Negative.   Psychiatric/Behavioral: Negative.     PHYSICAL EXAMINATION:    BP 112/68   Pulse 68   Resp 16   Wt 97 lb (44 kg)   LMP  (LMP Unknown)   BMI 18.33 kg/m      General appearance: alert, cooperative and appears stated age Lymph:  no inguinal LAD noted  Pelvic: External genitalia:  no lesions              Urethra:  normal appearing urethra with no masses, tenderness or lesions              Bartholins and Skenes: normal                 Vagina: normal appearing vagina with normal color and discharge, no lesions.  Pessary removed and cleaned.  No vaginal lesions noted.  Pessary replaced without difficulty.  Cervix: absent              Bimanual Exam:  Uterus:  uterus absent              Adnexa: no mass, fullness, tenderness  Chaperone, Terence Lux, CMA, was present for exam.  Assessment: Cystocele Prior hx of incomplete bladder emptying requiring intermittent self catheterization prior to pessary placement Pessary use, #3 incontinence ring with support OAB, still with leakage  Plan: Plan to recheck in about 3 months.   She will continue to use Vit E vaginal cream around the pessary Has follow up scheduled with Dr. Matilde Sprang   21 minutes of total time was spent for this patient encounter, including preparation, face-to-face counseling with the patient and coordination of care, and documentation of the encounter.

## 2020-06-16 ENCOUNTER — Ambulatory Visit: Payer: Medicare PPO | Admitting: Obstetrics & Gynecology

## 2020-06-16 ENCOUNTER — Encounter: Payer: Self-pay | Admitting: Obstetrics & Gynecology

## 2020-06-16 ENCOUNTER — Other Ambulatory Visit: Payer: Self-pay

## 2020-06-16 VITALS — BP 112/68 | HR 68 | Resp 16 | Wt 97.0 lb

## 2020-06-16 DIAGNOSIS — N3281 Overactive bladder: Secondary | ICD-10-CM | POA: Diagnosis not present

## 2020-06-16 DIAGNOSIS — N8111 Cystocele, midline: Secondary | ICD-10-CM

## 2020-06-16 DIAGNOSIS — Z4689 Encounter for fitting and adjustment of other specified devices: Secondary | ICD-10-CM

## 2020-06-16 DIAGNOSIS — R32 Unspecified urinary incontinence: Secondary | ICD-10-CM

## 2020-07-14 DIAGNOSIS — M4120 Other idiopathic scoliosis, site unspecified: Secondary | ICD-10-CM | POA: Diagnosis not present

## 2020-08-04 NOTE — Progress Notes (Unsigned)
Subjective:   Kaitlyn Flowers is a 79 y.o. female who presents for Medicare Annual (Subsequent) preventive examination.  Review of Systems    N/A        Objective:    There were no vitals filed for this visit. There is no height or weight on file to calculate BMI.  Advanced Directives 03/22/2020 08/16/2019 05/17/2019 05/17/2019 05/13/2019 05/05/2019 07/22/2018  Does Patient Have a Medical Advance Directive? Yes No Yes Yes Yes Yes Yes  Type of Paramedic of [place];Living will - [place];Living will [place];Living will Living will;Healthcare Power of [place];Living will -  Does patient want to make changes to medical advance directive? - - No - Patient declined No - Patient declined No - Patient declined No - Patient declined -  Copy of [place] in Chart? - - Yes - validated most recent copy scanned in chart (See row information) Yes - validated most recent copy scanned in chart (See row information) Yes - validated most recent copy scanned in chart (See row information) No - copy requested -  Would patient like information on creating a medical advance directive? - No - Patient declined - - - - -  Pre-existing out of facility DNR order (yellow form or pink MOST form) - - - - - - -    Current Medications (verified) Outpatient Encounter Medications as of 08/07/2020  Medication Sig  . Acetylcysteine (N-ACETYL-L-CYSTEINE PO) Take 1 tablet by mouth daily.  Marland Kitchen CALCIUM PO Take 2.5 mLs by mouth daily. Liquid calcium  . diclofenac sodium (VOLTAREN) 1 % GEL Apply 1 application topically 4 (four) times daily as needed (pain).   Marland Kitchen GARLIC PO Take 1 capsule by mouth daily.  Marland Kitchen lamoTRIgine (LAMICTAL) 25 MG tablet Take 1 tablet (25 mg total) by mouth every evening.  Marland Kitchen MAGNESIUM PO Take 2.5 mLs by mouth daily. Takes a liquid form  . Meth-Hyo-M Bl-Na Phos-Ph Sal (URO-MP) 118 MG CAPS Take 118  mg by mouth daily as needed (UTI symptoms).  (Patient not taking: Reported on 06/16/2020)  . Multiple Vitamins-Minerals (ZINC PO) Take 2.5 mLs by mouth daily. Liquid zinc  . NONFORMULARY OR COMPOUNDED ITEM Vitamin E vaginal cream 200u/ml.  One ml pv twice weekly.  Disp. 3 month supply. (Patient taking differently: Place 1 mL vaginally 2 (two) times a week. Vitamin E vaginal cream 200u/ml.   Disp. 3 month supply.)  . Omega-3 Fatty Acids (OMEGA 3 PO) Take 1 capsule by mouth 2 (two) times daily.  Marland Kitchen pyridoxine (B-6) 200 MG tablet Take 200 mg by mouth daily.  . traMADol (ULTRAM) 50 MG tablet Take 50 mg by mouth 2 (two) times daily.   . Vibegron (GEMTESA) 75 MG TABS Take 75 mg by mouth every evening.   . Vitamin D-Vitamin K (VITAMIN K2-VITAMIN D3 PO) Take 1 capsule by mouth daily.   No facility-administered encounter medications on file as of 08/07/2020.    Allergies (verified) Ibuprofen-famotidine, Fentanyl, Pollen extract-tree extract [pollen extract], Pregabalin, and Penicillins   History: Past Medical History:  Diagnosis Date  . Anemia   . Arthritis   . Back pain   . Cancer ([place])    skin nose  . Depression    situational,due to pain  . Leg weakness   . Osteopenia   . PONV (postoperative nausea and vomiting)   . Right leg pain   . Seizures ([place])    05/13/2019- 4 years ago, "  couldnt process" couldnt speak happened 2 times; none since  . 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  . Urinary tract infection   . Wears hearing aid    Past Surgical History:  Procedure Laterality Date  . ABDOMINAL EXPOSURE N/A 05/17/2019   Procedure: ABDOMINAL EXPOSURE;  Surgeon: Angelia Mould, MD;  Location: [facility];  Service: Vascular;  Laterality: N/A;  . ABDOMINAL HYSTERECTOMY  1973   1/2 of ovary left cyst on ovary  . ANTERIOR LUMBAR FUSION N/A 05/17/2019   Procedure: Lumbar five Sacral one  Anterior lumbar interbody fusion;  Surgeon: Kristeen Miss,  MD;  Location: [facility];  Service: Neurosurgery;  Laterality: N/A;  . APPENDECTOMY     removed with hysterectomy  . COLONOSCOPY    . LUMBAR WOUND DEBRIDEMENT  12/06/2011   Procedure: LUMBAR WOUND DEBRIDEMENT;  Surgeon: Kristeen Miss, MD;  Location: [facility];  Service: Neurosurgery;  Laterality: N/A;  Repair of Pseudomeningocele Lumbar wound  . ROTATOR CUFF REPAIR     left   . shoulder rotator culff  Left 2006  . spinal cord tumor  1987   2012 october    Family History  Problem Relation Age of Onset  . Stroke Mother        2010  . Heart attack Father   . Cancer Paternal Grandmother    Social History   Socioeconomic History  . Marital status: Widowed    Spouse name: Not on file  . Number of children: Not on file  . Years of education: Not on file  . Highest education level: Not on file  Occupational History  . Not on file  Tobacco Use  . Smoking status: Former Smoker    Packs/day: 0.25    Years: 2.00    Pack years: 0.50    Types: Cigarettes    Quit date: 09/03/1967    Years since quitting: 52.9  . Smokeless tobacco: Never Used  . Tobacco comment: quit 48 years   Vaping Use  . Vaping Use: Never used  Substance and Sexual Activity  . Alcohol use: Yes    Alcohol/week: 4.0 standard drinks    Types: 4 Glasses of wine per week    Comment: 4 a week  . Drug use: No  . Sexual activity: Not Currently    Partners: Male    Birth control/protection: Surgical    Comment: hysterectomy  Other Topics Concern  . 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 Difficulty of Paying Living Expenses: Not on file  Food Insecurity:   . Worried About Charity fundraiser in the Last Year: Not on file  . Ran Out of Food in the Last Year: Not on file  Transportation Needs:     . Lack of Transportation (Medical): Not on file  . Lack of Transportation (Non-Medical): Not on file  Physical Activity:   . Days of Exercise per Week: Not on file  . Minutes of Exercise per Session: Not on file  Stress:   . Feeling of Stress : Not on file  Social Connections:   . Frequency of Communication with Friends and Family: Not on file  . Frequency of Social  Gatherings with Friends and Family: Not on file  . Attends Religious Services: Not on file  . Active Member of Clubs or Organizations: Not on file  . Attends Archivist Meetings: Not on file  . Marital Status: Not on file    Tobacco Counseling Counseling given: Not Answered Comment: quit 48 years    Clinical Intake:                 Diabetic?No          Activities of Daily Living No flowsheet data found.  Patient Care Team: Panosh, Standley Brooking, MD as PCP - Lisa Roca, MD (Neurosurgery) Carlyle Basques, MD as Consulting Physician (Infectious Diseases) Cameron Sprang, MD as Consulting Physician (Neurology) Kathie Rhodes, MD (Inactive) as Consulting Physician (Urology)  Indicate any recent Medical Services you may have received from other than [facility] providers in the past year (date may be approximate).     Assessment:   This is a routine wellness examination for [place].  Hearing/Vision screen No exam data present  Dietary issues and exercise activities discussed:    Goals    . patient     To enjoy life and continue to research options       Depression Screen PHQ 2/9 Scores 06/03/2017 04/24/2017 11/20/2015 11/13/2015 11/08/2015  PHQ - 2 Score 1 1 1  0 0  PHQ- 9 Score - - 1 - -    Fall Risk Fall Risk  03/22/2020 09/08/2019 09/01/2018 09/26/2017 06/06/2017  Falls in the past year? 0 0 0 No Yes  Number falls in past yr: 0 0 0 - 2 or more  Injury with Fall? 0 0 0 - Yes  Risk for fall due to : - - - - -  Follow up - Falls evaluation completed - - -    Any stairs in or  around the home? {YES/NO:21197} If so, are there any without handrails? No  Home free of loose throw rugs in walkways, pet beds, electrical cords, etc? Yes  Adequate lighting in your home to reduce risk of falls? Yes   ASSISTIVE DEVICES UTILIZED TO PREVENT FALLS:  Life alert? {YES/NO:21197} Use of a cane, walker or w/c? {YES/NO:21197} Grab bars in the bathroom? {YES/NO:21197} Shower chair or bench in shower? {YES/NO:21197} Elevated toilet seat or a handicapped toilet? {YES/NO:21197}  TIMED UP AND GO:  Was the test performed? {YES/NO:21197}.  Length of time to ambulate 10 feet: *** sec.   {Appearance of HALP:3790240}  Cognitive Function:        Immunizations Immunization History  Administered Date(s) Administered  . Fluad Quad(high Dose 65+) 08/11/2019  . Influenza Split 05/29/2012  . Influenza Whole 06/30/2007  . Influenza, High Dose Seasonal PF 07/18/2014, 05/23/2015, 06/21/2016, 09/07/2018  . Influenza,inj,Quad PF,6+ Mos 06/03/2017  . PFIZER SARS-COV-2 Vaccination 12/16/2019, 01/10/2020  . Pneumococcal Conjugate-13 06/21/2016  . Pneumococcal Polysaccharide-23 12/18/2010  . Tdap 12/18/2010  . Zoster 06/12/2012    TDAP status: Up to date {Flu Vaccine status:2101806} Pneumococcal vaccine status: Up to date Covid-19 vaccine status: Completed vaccines  Qualifies for Shingles Vaccine? Yes   Zostavax completed Yes   Shingrix Completed?: No.    Education has been provided regarding the importance of this vaccine. Patient has been advised to call insurance company to determine out of pocket expense if they have not yet received this vaccine. Advised may also receive vaccine at local pharmacy or Health Dept. Verbalized acceptance and understanding.  Screening Tests Health Maintenance  Topic Date Due  .  Hepatitis C Screening  Never done  . INFLUENZA VACCINE  04/02/2020  . TETANUS/TDAP  12/17/2020  . DEXA SCAN  Completed  . COVID-19 Vaccine  Completed  . PNA vac Low  Risk Adult  Completed    Health Maintenance  Health Maintenance Due  Topic Date Due  . Hepatitis C Screening  Never done  . INFLUENZA VACCINE  04/02/2020    Colorectal cancer screening: No longer required.  Mammogram status: No longer required.  Bone Density status: Completed 01/08/2018. Results reflect: Bone density results: OSTEOPENIA. Repeat every 5 years.  Lung Cancer Screening: (Low Dose CT Chest recommended if Age 32-80 years, 30 pack-year currently smoking OR have quit w/in 15years.) does not qualify.   Lung Cancer Screening Referral: n/A   Additional Screening:  Hepatitis C Screening: does qualify;  Vision Screening: Recommended annual ophthalmology exams for early detection of glaucoma and other disorders of the eye. Is the patient up to date with their annual eye exam?  {YES/NO:21197} Who is the provider or what is the name of the office in which the patient attends annual eye exams? *** If pt is not established with a provider, would they like to be referred to a provider to establish care? {YES/NO:21197}.   Dental Screening: Recommended annual dental exams for proper oral hygiene  Community Resource Referral / Chronic Care Management: CRR required this visit?  {YES/NO:21197}  CCM required this visit?  {YES/NO:21197}     Plan:     I have personally reviewed and noted the following in the patient's chart:   . Medical and social history . Use of alcohol, tobacco or illicit drugs  . Current medications and supplements . Functional ability and status . Nutritional status . Physical activity . Advanced directives . List of other physicians . Hospitalizations, surgeries, and ER visits in previous 12 months . Vitals . Screenings to include cognitive, depression, and falls . Referrals and appointments  In addition, I have reviewed and discussed with patient certain preventive protocols, quality metrics, and best practice recommendations. A written  personalized care plan for preventive services as well as general preventive health recommendations were provided to patient.     Ofilia Neas, LPN   [id number]   Nurse Notes: ***

## 2020-08-07 ENCOUNTER — Ambulatory Visit: Payer: Medicare PPO

## 2020-08-08 DIAGNOSIS — M4120 Other idiopathic scoliosis, site unspecified: Secondary | ICD-10-CM | POA: Diagnosis not present

## 2020-08-11 ENCOUNTER — Telehealth: Payer: Self-pay

## 2020-08-11 NOTE — Telephone Encounter (Addendum)
ATC patient to schedule her for Prolia but she did not answer and her mailbox was full so I could not leave a message-she owes $40 at check-in and she can be scheduled at any time-PA approval is [id number] and is good  from 11/12/19 to 08/22/21

## 2020-08-11 NOTE — Progress Notes (Signed)
Subjective:   Kaitlyn Flowers is a 79 y.o. female who presents for Medicare Annual (Subsequent) preventive examination.  I connected with Kaitlyn Flowers  today by telephone and verified that I am speaking with the correct person using two identifiers. Location patient: home Location provider: work Persons participating in the virtual visit: patient, provider.   I discussed the limitations, risks, security and privacy concerns of performing an evaluation and management service by telephone and the availability of in person appointments. I also discussed with the patient that there may be a patient responsible charge related to this service. The patient expressed understanding and verbally consented to this telephonic visit.    Interactive audio and video telecommunications were attempted between this provider and patient, however failed, due to patient having technical difficulties OR patient did not have access to video capability.  We continued and completed visit with audio only.      Review of Systems    N/A  Cardiac Risk Factors include: advanced age (>49men, >31 women)     Objective:    Today's Vitals   08/14/20 1032  PainSc: 7    There is no height or weight on file to calculate BMI.  Advanced Directives 08/14/2020 03/22/2020 08/16/2019 05/17/2019 05/17/2019 05/13/2019 05/05/2019  Does Patient Have a Medical Advance Directive? Yes Yes No Yes Yes Yes Yes  Type of Paramedic of [place];Living will [place];Living will - [place];Living will [place];Living will Living will;Healthcare Power of [place];Living will  Does patient want to make changes to medical advance directive? No - Patient declined - - No - Patient declined No - Patient declined No - Patient declined No - Patient declined  Copy of [place] in Chart? Yes - validated most recent  copy scanned in chart (See row information) - - Yes - validated most recent copy scanned in chart (See row information) Yes - validated most recent copy scanned in chart (See row information) Yes - validated most recent copy scanned in chart (See row information) No - copy requested  Would patient like information on creating a medical advance directive? - - No - Patient declined - - - -  Pre-existing out of facility DNR order (yellow form or pink MOST form) - - - - - - -    Current Medications (verified) Outpatient Encounter Medications as of 08/14/2020  Medication Sig  . Acetylcysteine (N-ACETYL-L-CYSTEINE PO) Take 1 tablet by mouth daily.  Marland Kitchen CALCIUM PO Take 2.5 mLs by mouth daily. Liquid calcium  . diclofenac sodium (VOLTAREN) 1 % GEL Apply 1 application topically 4 (four) times daily as needed (pain).   Marland Kitchen GARLIC PO Take 1 capsule by mouth daily.  Marland Kitchen lamoTRIgine (LAMICTAL) 25 MG tablet Take 1 tablet (25 mg total) by mouth every evening.  Marland Kitchen MAGNESIUM PO Take 2.5 mLs by mouth daily. Takes a liquid form  . Multiple Vitamins-Minerals (ZINC PO) Take 2.5 mLs by mouth daily. Liquid zinc  . NONFORMULARY OR COMPOUNDED ITEM Vitamin E vaginal cream 200u/ml.  One ml pv twice weekly.  Disp. 3 month supply. (Patient taking differently: Place 1 mL vaginally 2 (two) times a week. Vitamin E vaginal cream 200u/ml.   Disp. 3 month supply.)  . pyridoxine (B-6) 200 MG tablet Take 200 mg by mouth daily.  . traMADol (ULTRAM) 50 MG tablet Take 50 mg by mouth 2 (two) times daily.   . Vibegron (GEMTESA) 75 MG TABS  Take 75 mg by mouth every evening.   . Vitamin D-Vitamin K (VITAMIN K2-VITAMIN D3 PO) Take 1 capsule by mouth daily.  . Meth-Hyo-M Bl-Na Phos-Ph Sal (URO-MP) 118 MG CAPS Take 118 mg by mouth daily as needed (UTI symptoms).  (Patient not taking: No sig reported)  . Omega-3 Fatty Acids (OMEGA 3 PO) Take 1 capsule by mouth 2 (two) times daily. (Patient not taking: Reported on 08/14/2020)   No  facility-administered encounter medications on file as of 08/14/2020.    Allergies (verified) Ibuprofen-famotidine, Fentanyl, Pollen extract-tree extract [pollen extract], Pregabalin, and Penicillins   History: Past Medical History:  Diagnosis Date  . Anemia   . Arthritis   . Back pain   . Cancer ([place])    skin nose  . Depression    situational,due to pain  . Leg weakness   . Osteopenia   . PONV (postoperative nausea and vomiting)   . Right leg pain   . Seizures ([place])    05/13/2019- 4 years ago, "couldnt process" couldnt speak happened 2 times; none since  . 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  . Urinary tract infection   . Wears hearing aid    Past Surgical History:  Procedure Laterality Date  . ABDOMINAL EXPOSURE N/A 05/17/2019   Procedure: ABDOMINAL EXPOSURE;  Surgeon: Angelia Mould, MD;  Location: [facility];  Service: Vascular;  Laterality: N/A;  . ABDOMINAL HYSTERECTOMY  1973   1/2 of ovary left cyst on ovary  . ANTERIOR LUMBAR FUSION N/A 05/17/2019   Procedure: Lumbar five Sacral one  Anterior lumbar interbody fusion;  Surgeon: Kristeen Miss, MD;  Location: [facility];  Service: Neurosurgery;  Laterality: N/A;  . APPENDECTOMY     removed with hysterectomy  . COLONOSCOPY    . LUMBAR WOUND DEBRIDEMENT  12/06/2011   Procedure: LUMBAR WOUND DEBRIDEMENT;  Surgeon: Kristeen Miss, MD;  Location: [facility];  Service: Neurosurgery;  Laterality: N/A;  Repair of Pseudomeningocele Lumbar wound  . ROTATOR CUFF REPAIR     left   . shoulder rotator culff  Left 2006  . spinal cord tumor  1987   2012 october    Family History  Problem Relation Age of Onset  . Stroke Mother        2010  . Heart attack Father   . Cancer Paternal Grandmother    Social History   Socioeconomic History  . Marital status: Widowed    Spouse name: Not on file  . Number of children: Not on file  . Years of education: Not on file  . Highest education  level: Not on file  Occupational History  . Not on file  Tobacco Use  . Smoking status: Former Smoker    Packs/day: 0.25    Years: 2.00    Pack years: 0.50    Types: Cigarettes    Quit date: 09/03/1967    Years since quitting: 52.9  . Smokeless tobacco: Never Used  . Tobacco comment: quit 48 years   Vaping Use  . Vaping Use: Never used  Substance and Sexual Activity  . Alcohol use: Yes    Alcohol/week: 4.0 standard drinks    Types: 4 Glasses of wine per week    Comment: 4 a week  . Drug use: No  . Sexual activity: Not Currently    Partners: Male    Birth control/protection: Surgical    Comment: hysterectomy  Other Topics Concern  . 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 Difficulty of Paying Living Expenses: Not hard at all  Food Insecurity: No Food Insecurity  . Worried About Charity fundraiser in the Last Year: Never true  . Ran Out of Food in the Last Year: Never true  Transportation Needs: No Transportation Needs  . Lack of Transportation (Medical): No  . Lack of Transportation (Non-Medical): No  Physical Activity: Sufficiently Active  . Days of Exercise per Week: 7 days  . Minutes of Exercise per Session: 30 min  Stress: No Stress Concern Present  . Feeling of Stress : Not at all  Social Connections: Moderately Integrated  . Frequency of Communication with Friends and Family: More than three times a week  . Frequency of Social Gatherings with Friends and Family: More than three times a week  . Attends Religious Services: More than 4 times per year  . Active Member of Clubs or Organizations: Yes  . Attends Archivist Meetings: More than 4 times per year  . Marital Status: Widowed    Tobacco Counseling Counseling given:  Not Answered Comment: quit 48 years    Clinical Intake:  Pre-visit preparation completed: Yes  Pain : 0-10 Pain Score: 7  Pain Type: Chronic pain Pain Location: Back Pain Orientation: Lower,Right Pain Descriptors / Indicators: Sharp,Burning Pain Onset: More than a month ago Pain Frequency: Intermittent Pain Relieving Factors: Laying on a cold pack  Pain Relieving Factors: Laying on a cold pack  Nutritional Risks: None Diabetes: No  How often do you need to have someone help you when you read instructions, pamphlets, or other written materials from your doctor or pharmacy?: 1 - Never What is the last grade level you completed in school?: Graduate School  Diabetic?No   Interpreter Needed?: No  Information entered by :: [place] of Daily Living In your present state of health, do you have any difficulty performing the following activities: 08/14/2020  Hearing? Y  Comment Has bilateral hearing aids  Vision? N  Difficulty concentrating or making decisions? Y  Comment has some issues with short term memory  Walking or climbing stairs? Y  Comment has difficulties due to back and nerve pain  Dressing or bathing? N  Doing errands, shopping? N  Preparing Food and eating ? N  Using the Toilet? N  In the past six months, have you accidently leaked urine? Y  Comment Has bladder leakage on regular basis.  Do you have problems with loss of bowel control? N  Managing your Medications? N  Managing your Finances? N  Housekeeping or managing your Housekeeping? N  Some recent data might be hidden    Patient Care Team: Panosh, Standley Brooking, MD as PCP - Lisa Roca, MD (Neurosurgery) Carlyle Basques, MD as Consulting Physician (Infectious Diseases) Cameron Sprang, MD as Consulting Physician (Neurology) Kathie Rhodes, MD (Inactive) as Consulting Physician (Urology)  Indicate any recent Medical Services you may have received from other than [facility] providers in  the past year (date may be approximate).     Assessment:   This is a routine wellness examination for [place].  Hearing/Vision screen  Hearing Screening   125Hz  250Hz  500Hz  1000Hz  2000Hz  3000Hz  4000Hz   6000Hz  8000Hz   Right ear:           Left ear:           Vision Screening Comments: Patient states gets eyes examined every couple of years   Dietary issues and exercise activities discussed: Current Exercise Habits: Home exercise routine;Structured exercise class, Type of exercise: walking;strength training/weights, Time (Minutes): 30, Frequency (Times/Week): 7, Weekly Exercise (Minutes/Week): 210, Intensity: Moderate  Goals    . patient     To enjoy life and continue to research options     . Patient Stated     I will continue to walk daily and go to and exercise class once per week.       Depression Screen PHQ 2/9 Scores 08/14/2020 06/03/2017 04/24/2017 11/20/2015 11/13/2015 11/08/2015  PHQ - 2 Score 0 1 1 1  0 0  PHQ- 9 Score 0 - - 1 - -    Fall Risk Fall Risk  08/14/2020 03/22/2020 09/08/2019 09/01/2018 09/26/2017  Falls in the past year? 0 0 0 0 No  Number falls in past yr: 0 0 0 0 -  Injury with Fall? 0 0 0 0 -  Risk for fall due to : Medication side effect - - - -  Follow up Falls evaluation completed;Falls prevention discussed - Falls evaluation completed - -    FALL RISK PREVENTION PERTAINING TO THE HOME:  Any stairs in or around the home? No  If so, are there any without handrails? No  Home free of loose throw rugs in walkways, pet beds, electrical cords, etc? Yes  Adequate lighting in your home to reduce risk of falls? Yes   ASSISTIVE DEVICES UTILIZED TO PREVENT FALLS:  Life alert? No  Use of a cane, walker or w/c? Yes  Grab bars in the bathroom? Yes  Shower chair or bench in shower? Yes  Elevated toilet seat or a handicapped toilet? Yes   Cognitive Function:      6CIT Screen 08/14/2020  What Year? 0 points  What month? 0 points  What time? 0 points   Count back from 20 0 points  Months in reverse 0 points  Repeat phrase 2 points  Total Score 2    Immunizations Immunization History  Administered Date(s) Administered  . Fluad Quad(high Dose 65+) 08/11/2019  . Influenza Split 05/29/2012  . Influenza Whole 06/30/2007  . Influenza, High Dose Seasonal PF 07/18/2014, 05/23/2015, 06/21/2016, 09/07/2018  . Influenza,inj,Quad PF,6+ Mos 06/03/2017  . PFIZER SARS-COV-2 Vaccination 12/16/2019, 01/10/2020  . Pneumococcal Conjugate-13 06/21/2016  . Pneumococcal Polysaccharide-23 12/18/2010  . Tdap 12/18/2010  . Zoster 06/12/2012  . Zoster Recombinat (Shingrix) 06/13/2020, 08/13/2020    TDAP status: Up to date  Flu Vaccine status: Due, Education has been provided regarding the importance of this vaccine. Advised may receive this vaccine at local pharmacy or Health Dept. Aware to provide a copy of the vaccination record if obtained from local pharmacy or Health Dept. Verbalized acceptance and understanding.  Pneumococcal vaccine status: Up to date  Covid-19 vaccine status: Completed vaccines  Qualifies for Shingles Vaccine? Yes   Zostavax completed Yes   Shingrix Completed?: No.    Education has been provided regarding the importance of this vaccine. Patient has been advised to call insurance company to determine out of pocket expense if they have not yet received this vaccine. Advised may also receive vaccine at local pharmacy or Health Dept. Verbalized acceptance and understanding.  Screening Tests Health Maintenance  Topic Date Due  . Hepatitis C  Screening  Never done  . COVID-19 Vaccine (3 - Pfizer risk 4-dose series) 02/07/2020  . INFLUENZA VACCINE  04/02/2020  . TETANUS/TDAP  12/17/2020  . DEXA SCAN  Completed  . PNA vac Low Risk Adult  Completed    Health Maintenance  Health Maintenance Due  Topic Date Due  . Hepatitis C Screening  Never done  . COVID-19 Vaccine (3 - Pfizer risk 4-dose series) 02/07/2020  . INFLUENZA  VACCINE  04/02/2020    Colorectal cancer screening: No longer required.   Mammogram status: No longer required due to age.  Bone Density status: Ordered 08/14/2020. Pt provided with contact info and advised to call to schedule appt.  Lung Cancer Screening: (Low Dose CT Chest recommended if Age 78-80 years, 30 pack-year currently smoking OR have quit w/in 15years.) does not qualify.   Lung Cancer Screening Referral: N/A   Additional Screening:  Hepatitis C Screening: does qualify; Completed   Vision Screening: Recommended annual ophthalmology exams for early detection of glaucoma and other disorders of the eye. Is the patient up to date with their annual eye exam?  No  Who is the provider or what is the name of the office in which the patient attends annual eye exams? Dr. Eric Form  If pt is not established with a provider, would they like to be referred to a provider to establish care? No .   Dental Screening: Recommended annual dental exams for proper oral hygiene  Community Resource Referral / Chronic Care Management: CRR required this visit?  No   CCM required this visit?  No      Plan:     I have personally reviewed and noted the following in the patient's chart:   . Medical and social history . Use of alcohol, tobacco or illicit drugs  . Current medications and supplements . Functional ability and status . Nutritional status . Physical activity . Advanced directives . List of other physicians . Hospitalizations, surgeries, and ER visits in previous 12 months . Vitals . Screenings to include cognitive, depression, and falls . Referrals and appointments  In addition, I have reviewed and discussed with patient certain preventive protocols, quality metrics, and best practice recommendations. A written personalized care plan for preventive services as well as general preventive health recommendations were provided to patient.     Ofilia Neas, LPN   [id number]    Nurse Notes: None

## 2020-08-14 ENCOUNTER — Ambulatory Visit (INDEPENDENT_AMBULATORY_CARE_PROVIDER_SITE_OTHER): Payer: Medicare PPO

## 2020-08-14 ENCOUNTER — Other Ambulatory Visit: Payer: Self-pay

## 2020-08-14 DIAGNOSIS — Z0000 Encounter for general adult medical examination without abnormal findings: Secondary | ICD-10-CM

## 2020-08-14 NOTE — Patient Instructions (Signed)
[name] , Thank you for taking time to come for your Medicare Wellness Visit. I appreciate your ongoing commitment to your health goals. Please review the following plan we discussed and let me know if I can assist you in the future.   Screening recommendations/referrals: Colonoscopy: No longer required  Mammogram: No longer required  Bone Density: Up to date, next due 01/18/2021 Recommended yearly ophthalmology/optometry visit for glaucoma screening and checkup Recommended yearly dental visit for hygiene and checkup  Vaccinations: Influenza vaccine: Currently due, you may receive this at your convenience  Pneumococcal vaccine: Completed series  Tdap vaccine: Up to date, next due 12/17/2020  Shingles vaccine: Completed series     Advanced directives: Copies on file   Conditions/risks identified: None   Next appointment: None    Preventive Care 56 Years and Older, Female Preventive care refers to lifestyle choices and visits with your health care provider that can promote health and wellness. What does preventive care include?  A yearly physical exam. This is also called an annual well check.  Dental exams once or twice a year.  Routine eye exams. Ask your health care provider how often you should have your eyes checked.  Personal lifestyle choices, including:  Daily care of your teeth and gums.  Regular physical activity.  Eating a healthy diet.  Avoiding tobacco and drug use.  Limiting alcohol use.  Practicing safe sex.  Taking low-dose aspirin every day.  Taking vitamin and mineral supplements as recommended by your health care provider. What happens during an annual well check? The services and screenings done by your health care provider during your annual well check will depend on your age, overall health, lifestyle risk factors, and family history of disease. Counseling  Your health care provider may ask you questions about your:  Alcohol  use.  Tobacco use.  Drug use.  Emotional well-being.  Home and relationship well-being.  Sexual activity.  Eating habits.  History of falls.  Memory and ability to understand (cognition).  Work and work Statistician.  Reproductive health. Screening  You may have the following tests or measurements:  Height, weight, and BMI.  Blood pressure.  Lipid and cholesterol levels. These may be checked every 5 years, or more frequently if you are over 33 years old.  Skin check.  Lung cancer screening. You may have this screening every year starting at age 14 if you have a 30-pack-year history of smoking and currently smoke or have quit within the past 15 years.  Fecal occult blood test (FOBT) of the stool. You may have this test every year starting at age 37.  Flexible sigmoidoscopy or colonoscopy. You may have a sigmoidoscopy every 5 years or a colonoscopy every 10 years starting at age 33.  Hepatitis C blood test.  Hepatitis B blood test.  Sexually transmitted disease (STD) testing.  Diabetes screening. This is done by checking your blood sugar (glucose) after you have not eaten for a while (fasting). You may have this done every 1-3 years.  Bone density scan. This is done to screen for osteoporosis. You may have this done starting at age 79.  Mammogram. This may be done every 1-2 years. Talk to your health care provider about how often you should have regular mammograms. Talk with your health care provider about your test results, treatment options, and if necessary, the need for more tests. Vaccines  Your health care provider may recommend certain vaccines, such as:  Influenza vaccine. This is recommended every year.  Tetanus, diphtheria, and acellular pertussis (Tdap, Td) vaccine. You may need a Td booster every 10 years.  Zoster vaccine. You may need this after age 11.  Pneumococcal 13-valent conjugate (PCV13) vaccine. One dose is recommended after age  6.  Pneumococcal polysaccharide (PPSV23) vaccine. One dose is recommended after age 69. Talk to your health care provider about which screenings and vaccines you need and how often you need them. This information is not intended to replace advice given to you by your health care provider. Make sure you discuss any questions you have with your health care provider. Document Released: 09/15/2015 Document Revised: 05/08/2016 Document Reviewed: 06/20/2015 Elsevier Interactive Patient Education  2017 [place] Prevention in the Home Falls can cause injuries. They can happen to people of all ages. There are many things you can do to make your home safe and to help prevent falls. What can I do on the outside of my home?  Regularly fix the edges of walkways and driveways and fix any cracks.  Remove anything that might make you trip as you walk through a door, such as a raised step or threshold.  Trim any bushes or trees on the path to your home.  Use bright outdoor lighting.  Clear any walking paths of anything that might make someone trip, such as rocks or tools.  Regularly check to see if handrails are loose or broken. Make sure that both sides of any steps have handrails.  Any raised decks and porches should have guardrails on the edges.  Have any leaves, snow, or ice cleared regularly.  Use sand or salt on walking paths during winter.  Clean up any spills in your garage right away. This includes oil or grease spills. What can I do in the bathroom?  Use night lights.  Install grab bars by the toilet and in the tub and shower. Do not use towel bars as grab bars.  Use non-skid mats or decals in the tub or shower.  If you need to sit down in the shower, use a plastic, non-slip stool.  Keep the floor dry. Clean up any water that spills on the floor as soon as it happens.  Remove soap buildup in the tub or shower regularly.  Attach bath mats securely with double-sided  non-slip rug tape.  Do not have throw rugs and other things on the floor that can make you trip. What can I do in the bedroom?  Use night lights.  Make sure that you have a light by your bed that is easy to reach.  Do not use any sheets or blankets that are too big for your bed. They should not hang down onto the floor.  Have a firm chair that has side arms. You can use this for support while you get dressed.  Do not have throw rugs and other things on the floor that can make you trip. What can I do in the kitchen?  Clean up any spills right away.  Avoid walking on wet floors.  Keep items that you use a lot in easy-to-reach places.  If you need to reach something above you, use a strong step stool that has a grab bar.  Keep electrical cords out of the way.  Do not use floor polish or wax that makes floors slippery. If you must use wax, use non-skid floor wax.  Do not have throw rugs and other things on the floor that can make you trip. What can I do  with my stairs?  Do not leave any items on the stairs.  Make sure that there are handrails on both sides of the stairs and use them. Fix handrails that are broken or loose. Make sure that handrails are as long as the stairways.  Check any carpeting to make sure that it is firmly attached to the stairs. Fix any carpet that is loose or worn.  Avoid having throw rugs at the top or bottom of the stairs. If you do have throw rugs, attach them to the floor with carpet tape.  Make sure that you have a light switch at the top of the stairs and the bottom of the stairs. If you do not have them, ask someone to add them for you. What else can I do to help prevent falls?  Wear shoes that:  Do not have high heels.  Have rubber bottoms.  Are comfortable and fit you well.  Are closed at the toe. Do not wear sandals.  If you use a stepladder:  Make sure that it is fully opened. Do not climb a closed stepladder.  Make sure that both  sides of the stepladder are locked into place.  Ask someone to hold it for you, if possible.  Clearly mark and make sure that you can see:  Any grab bars or handrails.  First and last steps.  Where the edge of each step is.  Use tools that help you move around (mobility aids) if they are needed. These include:  Canes.  Walkers.  Scooters.  Crutches.  Turn on the lights when you go into a dark area. Replace any light bulbs as soon as they burn out.  Set up your furniture so you have a clear path. Avoid moving your furniture around.  If any of your floors are uneven, fix them.  If there are any pets around you, be aware of where they are.  Review your medicines with your doctor. Some medicines can make you feel dizzy. This can increase your chance of falling. Ask your doctor what other things that you can do to help prevent falls. This information is not intended to replace advice given to you by your health care provider. Make sure you discuss any questions you have with your health care provider. Document Released: 06/15/2009 Document Revised: 01/25/2016 Document Reviewed: 09/23/2014 Elsevier Interactive Patient Education  2017 [employer].

## 2020-08-16 DIAGNOSIS — R634 Abnormal weight loss: Secondary | ICD-10-CM | POA: Diagnosis not present

## 2020-08-16 DIAGNOSIS — K5901 Slow transit constipation: Secondary | ICD-10-CM | POA: Diagnosis not present

## 2020-08-16 DIAGNOSIS — R110 Nausea: Secondary | ICD-10-CM | POA: Diagnosis not present

## 2020-08-16 DIAGNOSIS — R109 Unspecified abdominal pain: Secondary | ICD-10-CM | POA: Diagnosis not present

## 2020-08-17 DIAGNOSIS — M4120 Other idiopathic scoliosis, site unspecified: Secondary | ICD-10-CM | POA: Diagnosis not present

## 2020-08-18 ENCOUNTER — Ambulatory Visit: Payer: Medicare PPO

## 2020-08-21 DIAGNOSIS — M4120 Other idiopathic scoliosis, site unspecified: Secondary | ICD-10-CM | POA: Diagnosis not present

## 2020-08-22 ENCOUNTER — Ambulatory Visit: Payer: Medicare PPO

## 2020-09-05 ENCOUNTER — Ambulatory Visit: Payer: Medicare PPO

## 2020-09-07 DIAGNOSIS — N3941 Urge incontinence: Secondary | ICD-10-CM | POA: Diagnosis not present

## 2020-09-07 DIAGNOSIS — N3000 Acute cystitis without hematuria: Secondary | ICD-10-CM | POA: Diagnosis not present

## 2020-09-12 ENCOUNTER — Ambulatory Visit: Payer: Medicare PPO

## 2020-09-12 ENCOUNTER — Telehealth: Payer: Self-pay | Admitting: Internal Medicine

## 2020-09-12 ENCOUNTER — Other Ambulatory Visit: Payer: Self-pay

## 2020-09-12 DIAGNOSIS — M810 Age-related osteoporosis without current pathological fracture: Secondary | ICD-10-CM | POA: Diagnosis not present

## 2020-09-12 MED ORDER — DENOSUMAB 60 MG/ML ~~LOC~~ SOSY
60.0000 mg | PREFILLED_SYRINGE | Freq: Once | SUBCUTANEOUS | Status: AC
  Administered 2020-09-12: 60 mg via SUBCUTANEOUS

## 2020-09-12 NOTE — Telephone Encounter (Signed)
Patient states that last scan was May 2019 and believes it is time for a follow up.  Please let her know at [phone]

## 2020-09-12 NOTE — Progress Notes (Signed)
Prolia injection administered. Patient tolerated well.  

## 2020-09-14 ENCOUNTER — Other Ambulatory Visit: Payer: Self-pay | Admitting: Internal Medicine

## 2020-09-14 DIAGNOSIS — M810 Age-related osteoporosis without current pathological fracture: Secondary | ICD-10-CM

## 2020-09-14 NOTE — Telephone Encounter (Signed)
Kaitlyn Flowers, I ordered the bone density scan at [facility].  Please give her the telephone number for this: [phone]  To schedule it herself. I will then need to see her within the next 3 months.  I would like to have the bone density results when she returns to see me.

## 2020-09-14 NOTE — Telephone Encounter (Signed)
Should pt contact PCP or is that something you order?

## 2020-09-14 NOTE — Telephone Encounter (Signed)
Called and advised pt to call and schedule scan and call us back to schedule a follow up. Pt verbalized understanding.

## 2020-09-15 DIAGNOSIS — M4120 Other idiopathic scoliosis, site unspecified: Secondary | ICD-10-CM | POA: Diagnosis not present

## 2020-09-19 ENCOUNTER — Inpatient Hospital Stay: Admission: RE | Admit: 2020-09-19 | Payer: Medicare PPO | Source: Ambulatory Visit

## 2020-09-22 DIAGNOSIS — M4120 Other idiopathic scoliosis, site unspecified: Secondary | ICD-10-CM | POA: Diagnosis not present

## 2020-09-26 ENCOUNTER — Ambulatory Visit (INDEPENDENT_AMBULATORY_CARE_PROVIDER_SITE_OTHER)
Admission: RE | Admit: 2020-09-26 | Discharge: 2020-09-26 | Disposition: A | Discharge status: Home / Self Care | Payer: Medicare PPO | Source: Ambulatory Visit | Attending: Internal Medicine | Admitting: Internal Medicine

## 2020-09-26 ENCOUNTER — Other Ambulatory Visit: Payer: Self-pay

## 2020-09-26 DIAGNOSIS — M810 Age-related osteoporosis without current pathological fracture: Secondary | ICD-10-CM | POA: Diagnosis not present

## 2020-09-26 DIAGNOSIS — H2513 Age-related nuclear cataract, bilateral: Secondary | ICD-10-CM | POA: Diagnosis not present

## 2020-09-28 DIAGNOSIS — Z20822 Contact with and (suspected) exposure to covid-19: Secondary | ICD-10-CM | POA: Diagnosis not present

## 2020-10-06 DIAGNOSIS — M4120 Other idiopathic scoliosis, site unspecified: Secondary | ICD-10-CM | POA: Diagnosis not present

## 2020-10-10 DIAGNOSIS — M48061 Spinal stenosis, lumbar region without neurogenic claudication: Secondary | ICD-10-CM | POA: Diagnosis not present

## 2020-10-10 DIAGNOSIS — Z981 Arthrodesis status: Secondary | ICD-10-CM | POA: Diagnosis not present

## 2020-10-11 DIAGNOSIS — N3946 Mixed incontinence: Secondary | ICD-10-CM | POA: Diagnosis not present

## 2020-10-11 DIAGNOSIS — M4186 Other forms of scoliosis, lumbar region: Secondary | ICD-10-CM | POA: Diagnosis not present

## 2020-10-11 DIAGNOSIS — M545 Low back pain, unspecified: Secondary | ICD-10-CM | POA: Diagnosis not present

## 2020-10-16 ENCOUNTER — Other Ambulatory Visit: Payer: Self-pay

## 2020-10-16 ENCOUNTER — Encounter (HOSPITAL_BASED_OUTPATIENT_CLINIC_OR_DEPARTMENT_OTHER): Payer: Self-pay | Admitting: Obstetrics & Gynecology

## 2020-10-16 ENCOUNTER — Ambulatory Visit (INDEPENDENT_AMBULATORY_CARE_PROVIDER_SITE_OTHER): Payer: Medicare PPO | Admitting: Obstetrics & Gynecology

## 2020-10-16 VITALS — BP 131/71 | HR 73 | Ht 60.0 in | Wt 99.0 lb

## 2020-10-16 DIAGNOSIS — Z1231 Encounter for screening mammogram for malignant neoplasm of breast: Secondary | ICD-10-CM

## 2020-10-16 DIAGNOSIS — N3281 Overactive bladder: Secondary | ICD-10-CM | POA: Diagnosis not present

## 2020-10-16 DIAGNOSIS — N8111 Cystocele, midline: Secondary | ICD-10-CM | POA: Diagnosis not present

## 2020-10-16 DIAGNOSIS — Z4689 Encounter for fitting and adjustment of other specified devices: Secondary | ICD-10-CM

## 2020-10-16 MED ORDER — NONFORMULARY OR COMPOUNDED ITEM: 3 refills | Status: DC

## 2020-10-16 NOTE — Progress Notes (Addendum)
GYNECOLOGY  VISIT  CC:   pessary check, needs MMG  HPI: 80 y.o. G1P0 Widowed White or Caucasian female here for pessary check.  She is taking [place] and this has helped a lot.  Has just started physical therapy with [facility].  She really likes the physical therapist she is seeing.    Denies vaginal bleeding or discharge.    Needs breast exam today.  GYNECOLOGIC HISTORY: No LMP recorded (lmp unknown). Patient has had a hysterectomy. Menopausal hormone therapy: none  Patient Active Problem List   Diagnosis Date Noted  . Midline cystocele 12/31/2019  . Spondylolisthesis at L5-S1 level 05/17/2019  . Tension-type headache, not intractable 06/11/2017  . Right leg weakness 06/11/2017  . Labile hypertension 09/27/2016  . Autonomic dysfunction 09/27/2016  . Localization-related idiopathic epilepsy and epileptic syndromes with seizures of localized onset, not intractable, without status epilepticus ([place]) 08/01/2016  . Hyponatremia 04/01/2016  . Nocturnal leg cramps 12/07/2015  . 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 Osteoporosis 12/26/2013  . Glucosuria 12/16/2012  . 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/11/2011  . Fatigue 11/11/2011  . Medicare annual wellness visit, initial 12/18/2010  . Wears hearing aid 12/18/2010  . Back pain 11/23/2010  . ABDOMINAL BRUIT 07/21/2009  . NEOPLASM, SKIN, UNCERTAIN BEHAVIOR 38/25/0539  . ANEMIA NOS 05/12/2007    Past Medical History:  Diagnosis Date  . Anemia   . Arthritis   . Back pain   . Cancer ([place])    skin nose  . Depression    situational,due to pain  . Leg weakness   . Osteopenia   . PONV (postoperative nausea and vomiting)   . Right leg pain   . Seizures ([place])    05/13/2019- 4 years ago, "couldnt process" couldnt speak happened 2 times; none since  . 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  . Urinary tract infection   . Wears hearing aid      Past Surgical History:  Procedure Laterality Date  . ABDOMINAL EXPOSURE N/A 05/17/2019   Procedure: ABDOMINAL EXPOSURE;  Surgeon: Angelia Mould, MD;  Location: [place];  Service: Vascular;  Laterality: N/A;  . ABDOMINAL HYSTERECTOMY  1973   1/2 of ovary left cyst on ovary  . ANTERIOR LUMBAR FUSION N/A 05/17/2019   Procedure: Lumbar five Sacral one  Anterior lumbar interbody fusion;  Surgeon: Kristeen Miss, MD;  Location: [place];  Service: Neurosurgery;  Laterality: N/A;  . APPENDECTOMY     removed with hysterectomy  . COLONOSCOPY    . LUMBAR WOUND DEBRIDEMENT  12/06/2011   Procedure: LUMBAR WOUND DEBRIDEMENT;  Surgeon: Kristeen Miss, MD;  Location: [place] [facility];  Service: Neurosurgery;  Laterality: N/A;  Repair of Pseudomeningocele Lumbar wound  . ROTATOR CUFF REPAIR     left   . shoulder rotator culff  Left 2006  . spinal cord tumor  1987   2012 october     MEDS:   Current Outpatient Medications on File Prior to Visit  Medication Sig Dispense Refill  . Acetylcysteine (N-ACETYL-L-CYSTEINE PO) Take 1 tablet by mouth daily.    Marland Kitchen CALCIUM PO Take 2.5 mLs by mouth daily. Liquid calcium    . diclofenac sodium (VOLTAREN) 1 % GEL Apply 1 application topically 4 (four) times daily as needed (pain).     Marland Kitchen GARLIC PO Take 1 capsule by mouth daily.    Marland Kitchen lamoTRIgine (LAMICTAL) 25 MG tablet Take 1 tablet (  25 mg total) by mouth every evening. 90 tablet 3  . MAGNESIUM PO Take 2.5 mLs by mouth daily. Takes a liquid form    . Omega-3 Fatty Acids (OMEGA 3 PO) Take 1 capsule by mouth 2 (two) times daily.    Marland Kitchen pyridoxine (B-6) 200 MG tablet Take 200 mg by mouth daily.    . traMADol (ULTRAM) 50 MG tablet Take 50 mg by mouth 2 (two) times daily.     . Vibegron (GEMTESA) 75 MG TABS Take 75 mg by mouth every evening.     . Vitamin D-Vitamin K (VITAMIN K2-VITAMIN D3 PO) Take 1 capsule by mouth daily.     No current facility-administered medications on file prior to visit.    ALLERGIES:  Ibuprofen-famotidine, Fentanyl, Pregabalin, and Penicillins  Family History  Problem Relation Age of Onset  . Stroke Mother        2010  . Heart attack Father   . Cancer Paternal Grandmother     SH:  Widowed, non smoker  Review of Systems  Constitutional: Negative.   Gastrointestinal: Negative.   Genitourinary: Negative for pelvic pain, vaginal bleeding and vaginal discharge.    PHYSICAL EXAMINATION:    BP 131/71   Pulse 73   Ht 5' (1.524 m)   Wt 99 lb (44.9 kg)   LMP  (LMP Unknown)   BMI 19.33 kg/m     General appearance: alert, cooperative and appears stated age Breasts: normal appearance, no masses or tenderness Abdomen: soft, non-tender; bowel sounds normal; no masses,  no organomegaly Lymph:  no inguinal LAD noted  Pelvic: External genitalia:  no lesions              Urethra:  normal appearing urethra with no masses, tenderness or lesions              Bartholins and Skenes: normal                 Vagina: normal appearing vagina with normal color and discharge, no lesions              Cervix: absent              Bimanual Exam:  Uterus:  uterus absent              Adnexa: no mass, fullness, tenderness  Pessary removed, cleansed.  Vagina without granulation tissue.  Pessary replaced without difficulty.  Chaperone, Britt Bottom, CMA, was present for exam.  Assessment/Plan: 1. Midline cystocele - pessary in placed - recheck 3 months - pt has started pelvic PT - breast and pelvic exam completed today  2. OAB (overactive bladder) - on Gemtesa and sees Dr. Matilde Sprang  3. Encounter for screening mammogram for malignant neoplasm of breast - MM DIGITAL SCREENING BILATERAL; Future  4. Pessary maintenance - NONFORMULARY OR COMPOUNDED ITEM; Vitamin E vaginal cream 200u/ml.  One ml pv twice weekly.  Disp. 3 month supply.  Dispense: 24 each; Refill: 3    22 minutes of total time was spent for this patient encounter, including preparation, face-to-face counseling  with the patient and coordination of care, and documentation of the encounter.

## 2020-10-20 DIAGNOSIS — M4120 Other idiopathic scoliosis, site unspecified: Secondary | ICD-10-CM | POA: Diagnosis not present

## 2020-10-25 DIAGNOSIS — N3946 Mixed incontinence: Secondary | ICD-10-CM | POA: Diagnosis not present

## 2020-10-25 DIAGNOSIS — M4186 Other forms of scoliosis, lumbar region: Secondary | ICD-10-CM | POA: Diagnosis not present

## 2020-10-25 DIAGNOSIS — N8110 Cystocele, unspecified: Secondary | ICD-10-CM | POA: Diagnosis not present

## 2020-10-27 DIAGNOSIS — M4120 Other idiopathic scoliosis, site unspecified: Secondary | ICD-10-CM | POA: Diagnosis not present

## 2020-10-31 ENCOUNTER — Other Ambulatory Visit (HOSPITAL_COMMUNITY): Payer: Self-pay | Admitting: Neurological Surgery

## 2020-10-31 ENCOUNTER — Other Ambulatory Visit: Payer: Self-pay | Admitting: Neurological Surgery

## 2020-10-31 DIAGNOSIS — M4727 Other spondylosis with radiculopathy, lumbosacral region: Secondary | ICD-10-CM

## 2020-11-08 ENCOUNTER — Ambulatory Visit (HOSPITAL_COMMUNITY)
Admission: RE | Admit: 2020-11-08 | Discharge: 2020-11-08 | Disposition: A | Discharge status: Home / Self Care | Payer: Medicare PPO | Source: Ambulatory Visit | Attending: Neurological Surgery | Admitting: Neurological Surgery

## 2020-11-08 ENCOUNTER — Other Ambulatory Visit: Payer: Self-pay

## 2020-11-08 DIAGNOSIS — M4727 Other spondylosis with radiculopathy, lumbosacral region: Secondary | ICD-10-CM

## 2020-11-08 DIAGNOSIS — M48061 Spinal stenosis, lumbar region without neurogenic claudication: Secondary | ICD-10-CM | POA: Diagnosis not present

## 2020-11-08 DIAGNOSIS — I700 Atherosclerosis of aorta: Secondary | ICD-10-CM | POA: Diagnosis not present

## 2020-11-08 DIAGNOSIS — M4317 Spondylolisthesis, lumbosacral region: Secondary | ICD-10-CM | POA: Diagnosis not present

## 2020-11-08 DIAGNOSIS — M5416 Radiculopathy, lumbar region: Secondary | ICD-10-CM | POA: Diagnosis not present

## 2020-11-08 DIAGNOSIS — M965 Postradiation scoliosis: Secondary | ICD-10-CM | POA: Diagnosis not present

## 2020-11-08 DIAGNOSIS — M545 Low back pain, unspecified: Secondary | ICD-10-CM | POA: Diagnosis not present

## 2020-11-08 DIAGNOSIS — Z981 Arthrodesis status: Secondary | ICD-10-CM | POA: Insufficient documentation

## 2020-11-08 DIAGNOSIS — M419 Scoliosis, unspecified: Secondary | ICD-10-CM | POA: Diagnosis not present

## 2020-11-08 MED ORDER — HYDROCODONE-ACETAMINOPHEN 5-325 MG PO TABS
1.0000 | ORAL_TABLET | Freq: Four times a day (QID) | ORAL | Status: DC | PRN
  Administered 2020-11-08: 1 via ORAL
  Filled 2020-11-08: qty 2

## 2020-11-08 MED ORDER — LIDOCAINE HCL (PF) 1 % IJ SOLN
5.0000 mL | Freq: Once | INTRAMUSCULAR | Status: AC
  Administered 2020-11-08: 5 mL via INTRADERMAL

## 2020-11-08 MED ORDER — DIAZEPAM 5 MG PO TABS
5.0000 mg | ORAL_TABLET | Freq: Once | ORAL | Status: AC
  Filled 2020-11-08: qty 1

## 2020-11-08 MED ORDER — ONDANSETRON HCL 4 MG/2ML IJ SOLN: 4.0000 mg | Freq: Four times a day (QID) | INTRAMUSCULAR | Status: DC | PRN

## 2020-11-08 MED ORDER — DIAZEPAM 5 MG PO TABS
ORAL_TABLET | ORAL | Status: AC
  Administered 2020-11-08: 5 mg via ORAL
  Filled 2020-11-08: qty 1

## 2020-11-08 MED ORDER — IOHEXOL 180 MG/ML  SOLN
20.0000 mL | Freq: Once | INTRAMUSCULAR | Status: AC | PRN
  Administered 2020-11-08: 12 mL via INTRATHECAL

## 2020-11-08 NOTE — Procedures (Signed)
Kaitlyn Flowers is a 80 year old individual whose had significant disease in her spinal canal.  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  She has had a spondylolisthesis at L5-S1 that required anterior decompression and fusion.  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  She is been evaluated with an MRI but now myelogram is being performed as there is significant metallic artifact at U3-O7.  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  Further evaluation with CT scanning.

## 2020-11-08 NOTE — Discharge Instructions (Signed)

## 2020-11-10 DIAGNOSIS — M4120 Other idiopathic scoliosis, site unspecified: Secondary | ICD-10-CM | POA: Diagnosis not present

## 2020-11-10 DIAGNOSIS — M965 Postradiation scoliosis: Secondary | ICD-10-CM | POA: Diagnosis not present

## 2020-11-14 DIAGNOSIS — R1032 Left lower quadrant pain: Secondary | ICD-10-CM | POA: Diagnosis not present

## 2020-11-14 DIAGNOSIS — K5900 Constipation, unspecified: Secondary | ICD-10-CM | POA: Diagnosis not present

## 2020-11-16 DIAGNOSIS — M965 Postradiation scoliosis: Secondary | ICD-10-CM | POA: Diagnosis not present

## 2020-11-17 DIAGNOSIS — M4180 Other forms of scoliosis, site unspecified: Secondary | ICD-10-CM | POA: Diagnosis not present

## 2020-11-17 DIAGNOSIS — I10 Essential (primary) hypertension: Secondary | ICD-10-CM | POA: Diagnosis not present

## 2020-11-23 DIAGNOSIS — M4120 Other idiopathic scoliosis, site unspecified: Secondary | ICD-10-CM | POA: Diagnosis not present

## 2020-12-05 DIAGNOSIS — M545 Low back pain, unspecified: Secondary | ICD-10-CM | POA: Diagnosis not present

## 2020-12-05 DIAGNOSIS — N8110 Cystocele, unspecified: Secondary | ICD-10-CM | POA: Diagnosis not present

## 2020-12-05 DIAGNOSIS — N3946 Mixed incontinence: Secondary | ICD-10-CM | POA: Diagnosis not present

## 2020-12-14 ENCOUNTER — Ambulatory Visit (HOSPITAL_BASED_OUTPATIENT_CLINIC_OR_DEPARTMENT_OTHER): Payer: Medicare PPO | Admitting: Obstetrics & Gynecology

## 2020-12-15 DIAGNOSIS — M4120 Other idiopathic scoliosis, site unspecified: Secondary | ICD-10-CM | POA: Diagnosis not present

## 2021-01-03 DIAGNOSIS — M25571 Pain in right ankle and joints of right foot: Secondary | ICD-10-CM | POA: Diagnosis not present

## 2021-01-05 DIAGNOSIS — M4120 Other idiopathic scoliosis, site unspecified: Secondary | ICD-10-CM | POA: Diagnosis not present

## 2021-01-11 DIAGNOSIS — M545 Low back pain, unspecified: Secondary | ICD-10-CM | POA: Diagnosis not present

## 2021-01-11 DIAGNOSIS — N3000 Acute cystitis without hematuria: Secondary | ICD-10-CM | POA: Diagnosis not present

## 2021-01-11 DIAGNOSIS — N3946 Mixed incontinence: Secondary | ICD-10-CM | POA: Diagnosis not present

## 2021-01-19 DIAGNOSIS — M4120 Other idiopathic scoliosis, site unspecified: Secondary | ICD-10-CM | POA: Diagnosis not present

## 2021-01-26 DIAGNOSIS — M6281 Muscle weakness (generalized): Secondary | ICD-10-CM | POA: Diagnosis not present

## 2021-01-26 DIAGNOSIS — M79604 Pain in right leg: Secondary | ICD-10-CM | POA: Diagnosis not present

## 2021-01-26 DIAGNOSIS — M79605 Pain in left leg: Secondary | ICD-10-CM | POA: Diagnosis not present

## 2021-01-26 DIAGNOSIS — M5459 Other low back pain: Secondary | ICD-10-CM | POA: Diagnosis not present

## 2021-01-26 DIAGNOSIS — R262 Difficulty in walking, not elsewhere classified: Secondary | ICD-10-CM | POA: Diagnosis not present

## 2021-01-31 DIAGNOSIS — M79604 Pain in right leg: Secondary | ICD-10-CM | POA: Diagnosis not present

## 2021-01-31 DIAGNOSIS — M5459 Other low back pain: Secondary | ICD-10-CM | POA: Diagnosis not present

## 2021-01-31 DIAGNOSIS — M79605 Pain in left leg: Secondary | ICD-10-CM | POA: Diagnosis not present

## 2021-01-31 DIAGNOSIS — M6281 Muscle weakness (generalized): Secondary | ICD-10-CM | POA: Diagnosis not present

## 2021-01-31 DIAGNOSIS — R262 Difficulty in walking, not elsewhere classified: Secondary | ICD-10-CM | POA: Diagnosis not present

## 2021-02-02 DIAGNOSIS — M4125 Other idiopathic scoliosis, thoracolumbar region: Secondary | ICD-10-CM | POA: Diagnosis not present

## 2021-02-02 DIAGNOSIS — M6281 Muscle weakness (generalized): Secondary | ICD-10-CM | POA: Diagnosis not present

## 2021-02-02 DIAGNOSIS — M4180 Other forms of scoliosis, site unspecified: Secondary | ICD-10-CM | POA: Diagnosis not present

## 2021-02-02 DIAGNOSIS — R262 Difficulty in walking, not elsewhere classified: Secondary | ICD-10-CM | POA: Diagnosis not present

## 2021-02-02 DIAGNOSIS — M5459 Other low back pain: Secondary | ICD-10-CM | POA: Diagnosis not present

## 2021-02-02 DIAGNOSIS — M79605 Pain in left leg: Secondary | ICD-10-CM | POA: Diagnosis not present

## 2021-02-02 DIAGNOSIS — M79604 Pain in right leg: Secondary | ICD-10-CM | POA: Diagnosis not present

## 2021-02-02 DIAGNOSIS — M545 Low back pain, unspecified: Secondary | ICD-10-CM | POA: Diagnosis not present

## 2021-02-04 ENCOUNTER — Telehealth: Payer: Self-pay

## 2021-02-04 NOTE — Telephone Encounter (Signed)
VOB initiated via [email or web address]

## 2021-02-07 DIAGNOSIS — M79604 Pain in right leg: Secondary | ICD-10-CM | POA: Diagnosis not present

## 2021-02-07 DIAGNOSIS — R262 Difficulty in walking, not elsewhere classified: Secondary | ICD-10-CM | POA: Diagnosis not present

## 2021-02-07 DIAGNOSIS — M79605 Pain in left leg: Secondary | ICD-10-CM | POA: Diagnosis not present

## 2021-02-07 DIAGNOSIS — M5459 Other low back pain: Secondary | ICD-10-CM | POA: Diagnosis not present

## 2021-02-07 DIAGNOSIS — M6281 Muscle weakness (generalized): Secondary | ICD-10-CM | POA: Diagnosis not present

## 2021-02-08 DIAGNOSIS — Z981 Arthrodesis status: Secondary | ICD-10-CM | POA: Diagnosis not present

## 2021-02-08 DIAGNOSIS — M21371 Foot drop, right foot: Secondary | ICD-10-CM | POA: Insufficient documentation

## 2021-02-08 DIAGNOSIS — G588 Other specified mononeuropathies: Secondary | ICD-10-CM | POA: Diagnosis not present

## 2021-02-08 DIAGNOSIS — M4180 Other forms of scoliosis, site unspecified: Secondary | ICD-10-CM | POA: Diagnosis not present

## 2021-02-08 DIAGNOSIS — M48061 Spinal stenosis, lumbar region without neurogenic claudication: Secondary | ICD-10-CM | POA: Diagnosis not present

## 2021-02-09 DIAGNOSIS — M4120 Other idiopathic scoliosis, site unspecified: Secondary | ICD-10-CM | POA: Diagnosis not present

## 2021-02-19 NOTE — Telephone Encounter (Signed)
Prior Auth required

## 2021-02-20 ENCOUNTER — Ambulatory Visit: Payer: Medicare PPO

## 2021-02-22 NOTE — Telephone Encounter (Signed)
PA initiated via [email or web address]  [name] (Key: [id number])  Your information has been sent to [facility].

## 2021-02-27 NOTE — Telephone Encounter (Signed)
Kaitlyn Flowers (Key: [id number])  This request has been approved.  Please note any additional information provided by [facility] at the bottom of your screen.  Quintina Gover KeyOretha Milch - PA Case ID: [id number] Need help? Call us at [phone] Outcome Approvedon June 24 PA Case: [id number], Status: Approved, Coverage Starts on: 11/12/2019 12:00:00 AM, Coverage Ends on: 09/01/2021 12:00:00 AM. Questions? Contact [phone]. Drug Prolia 60MG /ML syringes Form Nurse, adult and Medical Benefit PA Form

## 2021-02-27 NOTE — Telephone Encounter (Signed)
Pt ready for scheduling on or after 03/13/21  Out-of-pocket cost due at time of visit: $40  Primary: [employer] Prolia co-insurance: 0% Admin fee co-insurance: $40  Secondary: n/a Prolia co-insurance:  Admin fee co-insurance:   Deductible: does not apply  Prior Auth: APPROVED PA# [id number] Valid: 11/12/19-09/01/21

## 2021-03-01 DIAGNOSIS — N3946 Mixed incontinence: Secondary | ICD-10-CM | POA: Diagnosis not present

## 2021-03-01 DIAGNOSIS — M545 Low back pain, unspecified: Secondary | ICD-10-CM | POA: Diagnosis not present

## 2021-03-01 DIAGNOSIS — N816 Rectocele: Secondary | ICD-10-CM | POA: Diagnosis not present

## 2021-03-07 DIAGNOSIS — M21371 Foot drop, right foot: Secondary | ICD-10-CM | POA: Diagnosis not present

## 2021-03-07 DIAGNOSIS — M4135 Thoracogenic scoliosis, thoracolumbar region: Secondary | ICD-10-CM | POA: Diagnosis not present

## 2021-03-07 DIAGNOSIS — M5416 Radiculopathy, lumbar region: Secondary | ICD-10-CM | POA: Diagnosis not present

## 2021-03-08 ENCOUNTER — Ambulatory Visit (HOSPITAL_BASED_OUTPATIENT_CLINIC_OR_DEPARTMENT_OTHER): Payer: Medicare PPO | Admitting: Obstetrics & Gynecology

## 2021-03-08 ENCOUNTER — Encounter (HOSPITAL_BASED_OUTPATIENT_CLINIC_OR_DEPARTMENT_OTHER): Payer: Self-pay | Admitting: Obstetrics & Gynecology

## 2021-03-08 ENCOUNTER — Other Ambulatory Visit: Payer: Self-pay

## 2021-03-08 VITALS — BP 117/63 | HR 84 | Wt 100.6 lb

## 2021-03-08 DIAGNOSIS — N8111 Cystocele, midline: Secondary | ICD-10-CM

## 2021-03-08 DIAGNOSIS — N3281 Overactive bladder: Secondary | ICD-10-CM | POA: Diagnosis not present

## 2021-03-08 DIAGNOSIS — Z4689 Encounter for fitting and adjustment of other specified devices: Secondary | ICD-10-CM

## 2021-03-08 NOTE — Progress Notes (Signed)
80 y.o. Widowed White female G1P0 here for pessary check.  She reports she has been doing PT and feels she is doing quite well with this.  She is considering having pessary removed to see what her symptoms are like now after PT.  Feels PT was helpful.  Physical therapist suggested she consider removal.  She does have incontinence now with pessary in place.  It is better since doing PT.  Wants to knows my opinion.  Discussed with pt likely recurrence of prolapse as well as possible issues with incomplete bladder emptying and possible UTIs but if she wants pessary removed and left out, we can try this.  Also, feel should be done early in a week and when I will not be out of the office due to possibly needing replacement of pessary which she cannot do herself.  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   Urinary tract infection    Wears hearing aid    Current Outpatient Medications on File Prior to Visit  Medication Sig Dispense Refill   Acetylcysteine (N-ACETYL-L-CYSTEINE PO) Take 500 mg by mouth daily.     CALCIUM PO Take 2.5 mLs by mouth daily. Liquid calcium     cyanocobalamin 2000 MCG tablet Take 2,000 mcg by mouth daily.     diclofenac sodium (VOLTAREN) 1 % GEL Apply 1 application topically 4 (four) times daily as needed (pain).      GARLIC PO Take 3,536 mg by mouth daily.     L-Theanine 200 MG CAPS Take 200 mg by mouth 2 (two) times daily.     lamoTRIgine (LAMICTAL) 25 MG tablet Take 1 tablet (25 mg total) by mouth every evening. 90 tablet 3   MAGNESIUM PO Take 1 tablet by mouth daily.     Multiple  Vitamins-Minerals (ZINC PO) Take 2.5 mg by mouth daily.     NONFORMULARY OR COMPOUNDED ITEM Vitamin E vaginal cream 200u/ml.  One ml pv twice weekly.  Disp. 3 month supply. 24 each 3   pyridoxine (B-6) 200 MG tablet Take 200 mg by mouth daily.     [place] PO Take 1.8 g by mouth 2 (two) times daily.     traMADol (ULTRAM) 50 MG tablet Take 50 mg by mouth daily.     Vibegron (GEMTESA) 75 MG TABS Take 75 mg by mouth every evening.      Vitamin D-Vitamin K (VITAMIN K2-VITAMIN D3 PO) Take 1 capsule by mouth daily.     No current facility-administered medications on file prior to visit.   Allergies  Allergen Reactions   Ibuprofen-Famotidine Other (See Comments)    Elevated BP   Fentanyl Other (See Comments)    Constipates for about 2 weeks   Pregabalin Swelling    AMS, Swelling   Penicillins Rash    Did it involve swelling of the face/tongue/throat, SOB, or low BP? No Did it involve sudden or severe rash/hives, skin peeling, or any reaction on the inside of  your mouth or nose? No Did you need to seek medical attention at a hospital or doctor's office? No When did it last happen?      30+ years If all above answers are "NO", may proceed with cephalosporin use.    Soc Hx:  widowed, non smoker  Review of Systems  Genitourinary:  Negative for frequency and urgency.       No vaginal bleeding or discharge    Exam:   BP 117/63   Pulse 84   Wt 100 lb 9.6 oz (45.6 kg)   LMP  (LMP Unknown)   BMI 19.01 kg/m   General appearance: alert, cooperative, and no distress Inguinal lymph nodes:  not enlarged  Pelvic: External genitalia:  no lesions              Urethra: normal appearing urethra with no masses, tenderness or lesions              Bartholins and Skenes: Bartholin's, Urethra, Skene's normal                 Vagina: normal appearing vagina with normal color and discharge, no lesions, atrophic              Cervix: absent   Pap obtained:  no Bimanual Exam:  Uterus:  surgically  absent, vaginal cuff well healed                               Adnexa:    not indicated  Pessary was removed without difficulty.  Pessary was cleansed.  Pessary was replaced. Patient tolerated procedure well.    Assessment/Plan: 1. Midline cystocele - recheck 3 months.  2. OAB (overactive bladder)  3. Pessary maintenance

## 2021-03-13 ENCOUNTER — Other Ambulatory Visit: Payer: Self-pay

## 2021-03-13 ENCOUNTER — Ambulatory Visit: Payer: Medicare PPO

## 2021-03-13 DIAGNOSIS — M810 Age-related osteoporosis without current pathological fracture: Secondary | ICD-10-CM | POA: Diagnosis not present

## 2021-03-13 MED ORDER — DENOSUMAB 60 MG/ML ~~LOC~~ SOSY
60.0000 mg | PREFILLED_SYRINGE | Freq: Once | SUBCUTANEOUS | Status: AC
  Administered 2021-03-13: 60 mg via SUBCUTANEOUS

## 2021-03-13 NOTE — Progress Notes (Signed)
Prolia injection administered to pt's left arm. Pt tolerated well. °

## 2021-03-14 DIAGNOSIS — M5416 Radiculopathy, lumbar region: Secondary | ICD-10-CM | POA: Diagnosis not present

## 2021-03-14 DIAGNOSIS — M4135 Thoracogenic scoliosis, thoracolumbar region: Secondary | ICD-10-CM | POA: Diagnosis not present

## 2021-03-14 DIAGNOSIS — M21371 Foot drop, right foot: Secondary | ICD-10-CM | POA: Diagnosis not present

## 2021-03-16 DIAGNOSIS — M4135 Thoracogenic scoliosis, thoracolumbar region: Secondary | ICD-10-CM | POA: Diagnosis not present

## 2021-03-16 DIAGNOSIS — M5416 Radiculopathy, lumbar region: Secondary | ICD-10-CM | POA: Diagnosis not present

## 2021-03-16 DIAGNOSIS — M21371 Foot drop, right foot: Secondary | ICD-10-CM | POA: Diagnosis not present

## 2021-03-21 DIAGNOSIS — M21371 Foot drop, right foot: Secondary | ICD-10-CM | POA: Diagnosis not present

## 2021-03-21 DIAGNOSIS — M4135 Thoracogenic scoliosis, thoracolumbar region: Secondary | ICD-10-CM | POA: Diagnosis not present

## 2021-03-21 DIAGNOSIS — M5416 Radiculopathy, lumbar region: Secondary | ICD-10-CM | POA: Diagnosis not present

## 2021-03-23 ENCOUNTER — Ambulatory Visit: Payer: Medicare PPO | Admitting: Neurology

## 2021-04-03 DIAGNOSIS — G588 Other specified mononeuropathies: Secondary | ICD-10-CM | POA: Diagnosis not present

## 2021-04-03 DIAGNOSIS — M21371 Foot drop, right foot: Secondary | ICD-10-CM | POA: Diagnosis not present

## 2021-04-03 DIAGNOSIS — M5416 Radiculopathy, lumbar region: Secondary | ICD-10-CM | POA: Diagnosis not present

## 2021-04-06 DIAGNOSIS — M21371 Foot drop, right foot: Secondary | ICD-10-CM | POA: Diagnosis not present

## 2021-04-06 DIAGNOSIS — M5416 Radiculopathy, lumbar region: Secondary | ICD-10-CM | POA: Diagnosis not present

## 2021-04-06 DIAGNOSIS — M4135 Thoracogenic scoliosis, thoracolumbar region: Secondary | ICD-10-CM | POA: Diagnosis not present

## 2021-04-10 NOTE — Telephone Encounter (Signed)
Tell her it would be safer for her to take Tylenol. You can safely take up to 3000 mg a day of Tylenol

## 2021-04-13 DIAGNOSIS — M21371 Foot drop, right foot: Secondary | ICD-10-CM | POA: Diagnosis not present

## 2021-04-13 DIAGNOSIS — M4135 Thoracogenic scoliosis, thoracolumbar region: Secondary | ICD-10-CM | POA: Diagnosis not present

## 2021-04-13 DIAGNOSIS — M5416 Radiculopathy, lumbar region: Secondary | ICD-10-CM | POA: Diagnosis not present

## 2021-04-20 DIAGNOSIS — M4135 Thoracogenic scoliosis, thoracolumbar region: Secondary | ICD-10-CM | POA: Diagnosis not present

## 2021-04-20 DIAGNOSIS — M21371 Foot drop, right foot: Secondary | ICD-10-CM | POA: Diagnosis not present

## 2021-04-20 DIAGNOSIS — M5416 Radiculopathy, lumbar region: Secondary | ICD-10-CM | POA: Diagnosis not present

## 2021-04-25 DIAGNOSIS — N3941 Urge incontinence: Secondary | ICD-10-CM | POA: Diagnosis not present

## 2021-04-26 DIAGNOSIS — M21371 Foot drop, right foot: Secondary | ICD-10-CM | POA: Diagnosis not present

## 2021-04-26 DIAGNOSIS — M4135 Thoracogenic scoliosis, thoracolumbar region: Secondary | ICD-10-CM | POA: Diagnosis not present

## 2021-04-26 DIAGNOSIS — M5416 Radiculopathy, lumbar region: Secondary | ICD-10-CM | POA: Diagnosis not present

## 2021-05-01 DIAGNOSIS — G8929 Other chronic pain: Secondary | ICD-10-CM | POA: Diagnosis not present

## 2021-05-01 DIAGNOSIS — M545 Low back pain, unspecified: Secondary | ICD-10-CM | POA: Diagnosis not present

## 2021-05-03 ENCOUNTER — Other Ambulatory Visit: Payer: Self-pay

## 2021-05-03 ENCOUNTER — Encounter: Payer: Self-pay | Admitting: Internal Medicine

## 2021-05-03 ENCOUNTER — Ambulatory Visit: Payer: Medicare PPO | Admitting: Internal Medicine

## 2021-05-03 VITALS — BP 122/82 | HR 70 | Ht 61.0 in | Wt 98.6 lb

## 2021-05-03 DIAGNOSIS — E871 Hypo-osmolality and hyponatremia: Secondary | ICD-10-CM | POA: Diagnosis not present

## 2021-05-03 DIAGNOSIS — L814 Other melanin hyperpigmentation: Secondary | ICD-10-CM | POA: Diagnosis not present

## 2021-05-03 DIAGNOSIS — D1801 Hemangioma of skin and subcutaneous tissue: Secondary | ICD-10-CM | POA: Diagnosis not present

## 2021-05-03 DIAGNOSIS — Z85828 Personal history of other malignant neoplasm of skin: Secondary | ICD-10-CM | POA: Diagnosis not present

## 2021-05-03 DIAGNOSIS — M810 Age-related osteoporosis without current pathological fracture: Secondary | ICD-10-CM | POA: Diagnosis not present

## 2021-05-03 DIAGNOSIS — E559 Vitamin D deficiency, unspecified: Secondary | ICD-10-CM | POA: Diagnosis not present

## 2021-05-03 DIAGNOSIS — D225 Melanocytic nevi of trunk: Secondary | ICD-10-CM | POA: Diagnosis not present

## 2021-05-03 DIAGNOSIS — L821 Other seborrheic keratosis: Secondary | ICD-10-CM | POA: Diagnosis not present

## 2021-05-03 LAB — BASIC METABOLIC PANEL
BUN: 27 mg/dL — ABNORMAL HIGH (ref 6–23)
CO2: 30 mEq/L (ref 19–32)
Calcium: 9.5 mg/dL (ref 8.4–10.5)
Chloride: 102 mEq/L (ref 96–112)
Creatinine, Ser: 0.83 mg/dL (ref 0.40–1.20)
GFR: 67.23 mL/min (ref >60.00)
Glucose, Bld: 90 mg/dL (ref 70–99)
Potassium: 4.9 mEq/L (ref 3.5–5.1)
Sodium: 138 mEq/L (ref 135–145)

## 2021-05-03 NOTE — Progress Notes (Signed)
Minimal    Patient ID: Kaitlyn Flowers, female   DOB: 1941-04-21, 80 y.o.   MRN: [id number]  This visit occurred during the SARS-CoV-2 public health emergency.  Safety protocols were in place, including screening questions prior to the visit, additional usage of staff PPE, and extensive cleaning of exam room while observing appropriate contact time as indicated for disinfecting solutions.   HPI  Kaitlyn Flowers is a 80 y.o.-year-old female, returning for f/u for of hyponatremia and OP. Last visit more than 3 years ago.  Interim history: No falls or fractures since last visit. Her scoliosis continues to progress.  She has significant pain. She is seen in the scoliosis clinic at [facility]. She had a fusion sx at L5-S1 to help with back pain, right leg weakness and foot drop, but this did not help >> needs to have 4 vertebrae fused, but needs to have a second opinion at [facility].  She continues to have R leg mild drop. Her Urinary Tract Infections improved lately and she does not see Dr. Graylon Good anymore.  She takes Nitrofurantoin occasionally >> nausea.   Hyponatremia:  Reviewed history:  Patient had orthostatic syncope on 03/03/2016 and went to the emergency room.  After this, she again returned to ED with difficulty speaking, mild confusion and blurry vision on 03/22/2016.  CT head was negative for stroke and this was assumed to be a TIA or neuro changes from UTI.  However, the sodium level was found to be low, at 123.  MRI/MRA on 06/08/2016 did not show a pituitary mass. It showed chronic microvascular ds.  She started to have UTIs in the beginning of 2017 (Pseudomonas)-initially treated with PICC line IV antibiotics, and then p.o. antibiotics.  She had several recurrences.  She had a urinary catheter which she was using 3 times daily as it was suspected that she could not empty her bladder well.  The frequency of her UTIs decreased, then only cathing once a day. She then had a pessary placed >>  not cathing anymore.  She started to drink more water after these episodes to improve her urinary flow, and also as she developed dry mouth.   At the end of 2017, she developed Wieting aphasia, confusion, and then verbal aphasia which lasted 30 minutes.  At that time, she saw her PCP.  The sodium returned slightly low, at 132.  The episodes repeated and she also developed headaches.  She went to the emergency room.  Sodium was lower, at 129.  She subsequently saw Dr. Delice Lesch (neurology).  Of note, she also has severe, chronic, low back pain, with neuropathy left leg.  She tried a spinal cord stimulator but this did not work.  She is seen in the scoliosis clinic at [facility].  Her scoliosis is progressing.  Is and Os: Approximately 1.5 to 2 L a day. (1200 ml water +  240 ml coffee + 240 ml hot water).  No headaches or increased thirst but she does have occasional dry mouth - from [place].  Reviewed previous sodium levels: Lab Results  Component Value Date   NA 136 03/30/2020   NA 136 09/29/2019   NA 131 (L) 09/19/2019   NA 127 (L) 09/18/2019   NA 134 (L) 08/16/2019   NA 135 05/13/2019   NA 136 07/22/2018   NA 137 04/29/2018   NA 132 (L) 09/26/2017   NA 135 06/24/2017   NA 133 (A) 02/19/2017   NA 133 (L) 12/23/2016   NA 129 (L) 08/09/2016  NA 132 (L) 07/22/2016   NA 129 (L) 07/10/2016   Reviewed other pertinent labs from 04/2016: Osmolality     278 - 305 mOsm/kg 271 (L)  Cortisol, Plasma     ug/dL 10.2  C206 ACTH     6 - 50 pg/mL 16  Uric Acid, Serum     2.4 - 7.0 mg/dL 2.7  Osmolality, Urine     50 - 1,200 mOsm/kg 304  Potassium Urine     12 - 129 mmol/L 28  Sodium, Urine     28 - 272 mmol/L 77  Labs pointed towards improving SIADH/water overload without appropriate renal water wasting.    No history of adrenal insufficiency, hypothyroidism, hypertriglyceridemia.  OP: Reviewed history: She was diagnosed with osteoporosis in 2015.  She had a right ankle fracture  rollerskating.  She denies dizziness/vertigo/orthostasis.  Reviewed her previous DXA scan reports: 09/26/2020 ([place]) Lumbar spine L1-L4 (L2) Femoral neck (FN)  T-score +0.2 RFN: -2.5 LFN: -2.5  Change in BMD from previous DXA test (%) +13%* +2.7%  (*) statistically significant  01/08/2018 ([place]) Lumbar spine L1-L4 (L2) Femoral neck (FN)  T-score -0.1 RFN: -2.7 LFN: -2.5  Change in BMD from previous DXA test (%) +11.1%* -1.0%  (*) statistically significant  Date L1-L4 T score FN T score  12/10/2013 -1.7 RFN: -2.7 LFN: -2.5   L1-L4 decreased by 5.8% from 2012 Total mean hip BMD decreased 7.7%* from 2012    01/08/2016 Lumbar spine (L1-L4) Femoral neck (FN)  T-score - 1.9  RFN: - 2.5 LFN: - 2.7  Change in BMD from previous DXA test (%) - 1.9%  + 0.4%   (*) statistically significant  Previous treatments: - I suggested Fosamax weekly but she refused in 03/2016 - we started Prolia 09/03/2018.  Subsequent doses: 03/26/2019, 11/30/2019, 09/12/2020, 03/13/2021. She tolerates this well.  No history of persistent hyper or hypercalcemia, no history of kidney stones. Lab Results  Component Value Date   PTH 59 02/13/2016   PTH 66 (H) 07/11/2014   CALCIUM 8.9 03/30/2020   CALCIUM 9.4 09/29/2019   CALCIUM 7.3 (L) 09/19/2019   CALCIUM 7.6 (L) 09/18/2019   CALCIUM 9.6 08/16/2019   CALCIUM 9.0 05/13/2019   CALCIUM 9.8 07/22/2018   CALCIUM 10.2 04/29/2018   CALCIUM 9.3 08/09/2016   CALCIUM 9.8 07/09/2016   No history of thyrotoxicosis: Lab Results  Component Value Date   TSH 0.83 09/26/2017   TSH 1.28 02/13/2016   TSH 1.21 07/11/2014   TSH 1.11 11/11/2011   She does have a history of vitamin D deficiency.  She takes approximately 3000 to 5000 units of vitamin D daily.  Vitamin D levels have been normal lately: Lab Results  Component Value Date   VD25OH 46.63 04/29/2018   VD25OH 78.21 12/23/2016   VD25OH 64.30 07/22/2016   VD25OH 55.43 07/09/2016   VD25OH 94.31 04/02/2016    VD25OH 60.65 02/10/2014   She was on calcium citrate >> >> now off.  No CKD. Last BUN/Cr: Lab Results  Component Value Date   BUN 22 03/30/2020   CREATININE 0.82 03/30/2020   She does Pilates for exercise (including weight bearing exercises). Also in PT now for weakness in L leg.   She has a family history of osteoporosis in mother/sisters.  Mother had kyphosis.  Menopause was at 78 years old surgical TAH + BSO.  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  She has degenerative scoliosis, seen  at [facility].  ROS: Constitutional: no weight gain, + 3 lbs weight loss, no fatigue, no subjective hyperthermia, no subjective hypothermia, no nocturia Eyes: no blurry vision, no xerophthalmia ENT: no sore throat, no nodules palpated in neck, no dysphagia, no odynophagia, no hoarseness, no tinnitus, + hypoacusis Cardiovascular: no CP, no SOB, no palpitations, no leg swelling Respiratory: no cough, no SOB, no wheezing Gastrointestinal: + N, no V, no D, no C, no acid reflux Musculoskeletal: + muscle, no joint aches Skin: no rash, no hair loss Neurological: no tremors, no numbness or tingling/no dizziness/no HAs, + right leg weakness Psychiatric: no depression, no anxiety  Past Medical History:  Diagnosis Date   Anemia    Arthritis    Back pain    Cancer (HCC)    skin nose   Depression    situational,due to pain   Leg weakness    Osteopenia    PONV (postoperative nausea and vomiting)    Right leg pain    Seizures ([place])    05/13/2019- 4 years ago, "couldnt process" couldnt speak happened 2 times; none since   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   Urinary tract infection    Wears hearing aid    Past Surgical History:  Procedure Laterality Date   ABDOMINAL EXPOSURE N/A 05/17/2019   Procedure: ABDOMINAL EXPOSURE;  Surgeon: Angelia Mould, MD;  Location:  [facility];  Service: Vascular;  Laterality: N/A;   ABDOMINAL HYSTERECTOMY  1973   1/2 of ovary left cyst on ovary   ANTERIOR LUMBAR FUSION N/A 05/17/2019   Procedure: Lumbar five Sacral one  Anterior lumbar interbody fusion;  Surgeon: Kristeen Miss, MD;  Location: [facility];  Service: Neurosurgery;  Laterality: N/A;   APPENDECTOMY     removed with hysterectomy   COLONOSCOPY     LUMBAR WOUND DEBRIDEMENT  12/06/2011   Procedure: LUMBAR WOUND DEBRIDEMENT;  Surgeon: Kristeen Miss, MD;  Location: [facility];  Service: Neurosurgery;  Laterality: N/A;  Repair of Pseudomeningocele Lumbar wound   ROTATOR CUFF REPAIR     left    shoulder rotator culff  Left 2006   spinal cord tumor  1987   2012 october    History   Social History   Marital Status: married    Spouse Name: N/A    Number of Children: 2   Occupational History   Optometrist   Social History Main Topics   Smoking status: Former Smoker -- 0.25 packs/day for 2 years    Types: Cigarettes    Quit date: 09/03/1967   Smokeless tobacco: Not on file   Alcohol Use: 2.4 oz/week    4-5 Glasses of wine per week   Drug Use: No   Social History Narrative   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     CALCIUM PO Take 2.5 mLs by mouth daily. Liquid calcium     cyanocobalamin 2000 MCG tablet Take 2,000 mcg by mouth daily.     diclofenac sodium (VOLTAREN) 1 % GEL Apply 1 application topically 4 (four) times daily as needed (pain).      GARLIC PO Take [id number] mg by mouth daily.     L-Theanine 200 MG CAPS Take  200 mg by mouth 2 (two) times daily.     lamoTRIgine (LAMICTAL) 25 MG tablet Take 1 tablet (25 mg total) by mouth every evening. 90 tablet 3   MAGNESIUM PO Take 1 tablet by mouth daily.     Multiple  Vitamins-Minerals (ZINC PO) Take 2.5 mg by mouth daily.     NONFORMULARY OR COMPOUNDED ITEM Vitamin E vaginal cream 200u/ml.  One ml pv twice weekly.  Disp. 3 month supply. 24 each 3   pyridoxine (B-6) 200 MG tablet Take 200 mg by mouth daily.     ST JOHNS WORT PO Take 1.8 g by mouth 2 (two) times daily.     traMADol (ULTRAM) 50 MG tablet Take 50 mg by mouth daily.     Vibegron (GEMTESA) 75 MG TABS Take 75 mg by mouth every evening.      Vitamin D-Vitamin K (VITAMIN K2-VITAMIN D3 PO) Take 1 capsule by mouth daily.     No current facility-administered medications on file prior to visit.   Allergies  Allergen Reactions   Ibuprofen-Famotidine Other (See Comments)    Elevated BP   Fentanyl Other (See Comments)    Constipates for about 2 weeks   Pregabalin Swelling    AMS, Swelling   Penicillins Rash    Did it involve swelling of the face/tongue/throat, SOB, or low BP? No Did it involve sudden or severe rash/hives, skin peeling, or any reaction on the inside of your mouth or nose? No Did you need to seek medical attention at a hospital or doctor's office? No When did it last happen?      30+ years If all above answers are "NO", may proceed with cephalosporin use.    Family History  Problem Relation Age of Onset   Stroke Mother        2010   Heart attack Father    Cancer Paternal Grandmother    PE: BP 122/82 (BP Location: Right Arm, Patient Position: Sitting, Cuff Size: Normal)   Pulse 70   Ht '5\' 1"'$  (1.549 m)   Wt 98 lb 9.6 oz (44.7 kg)   LMP  (LMP Unknown)   SpO2 96%   BMI 18.63 kg/m  Wt Readings from Last 3 Encounters:  05/03/21 98 lb 9.6 oz (44.7 kg)  03/08/21 100 lb 9.6 oz (45.6 kg)  11/08/20 100 lb (45.4 kg)   Constitutional: normal weight, in NAD Eyes: PERRLA, EOMI, no exophthalmos ENT: moist mucous membranes, no thyromegaly, no cervical lymphadenopathy Cardiovascular: RRR, No MRG Respiratory: CTA B Gastrointestinal: abdomen soft, NT, ND, BS+ Musculoskeletal: +  deformities: Significant scoliosis, strength intact in all 4 Skin: moist, warm, no rashes Neurological: no tremor with outstretched hands, DTR normal in all 4  Assessment: 1.  Hyponatremia  2.  Osteoporosis  Plan: 1. Hyponatremia -Patient developed hyponatremia in 03/2016.  She initially presented with orthostatic syncope and difficulty speaking, mild confusion, blurry vision, in the setting of a UTI.  Head CT and MRI were normal and her symptoms resolved.  Sodium was found to be 123.  Subsequent sodium levels have been improving to slightly low levels, before last visit, 132.  However, since then, her sodium levels normalized and the latest level reviewed from 05/31/2020 was 136. -Prior investigation pointed towards either SIADH or increased water intake that overcomes renal mechanisms of urine dilution (I suspect the latter). -Of note, she does have a history of seizures and is currently on Lamictal.  No recent episodes. -We continue to manage her with fluid  restrictions (intended approximately 1.5 L a day, but realistically she is up to almost 2 L a day). -We did discuss at last visit that sodium tablets are an option, but she does have labile blood pressure I do not feel that this is a good option for her -We will check her sodium again today  2. Osteoporosis -Patient returns after an absence of 3 years -At our last visit, we started her on Prolia which she continued since last visit.  She tolerates it well, without jaw/thigh/hip pain. -At this visit, we discussed about continuing for at least 6 to 10 years -I advised her  against starting the treatment abruptly due to the loss of bone density and increased risk of fracture -We reviewed together her previous DXA scans: in 01/08/2018 when her femoral scores were still in the osteoporosis range but fairly stable from the bone density from 2017 and the spine T score was slightly higher.  However, L2 vertebral was excluded from analysis due to  degenerative changes in 2019 compared to the scan from 2017, in which all vertebrae were analyzed.  She had another bone density scan 09/26/2020: Lumbar spine bone density increased significantly, while it was stable at the level of the femoral necks. -I reviewed her most recent imaging scans and she had CT of the lumbar spine on 11/08/2020 which showed significantly progressed scoliosis since 2018. -Of note, she also has a history of meningioma resected from her thoracic spine, followed by skeletal radiotherapy.  This would contraindicate the use of teriparatide or abaloparatide due to increased risk of osteosarcoma.  This would have been ideal for her and she is preparing for possible spine surgery. -However, since she is tolerating well Prolia, we can continue with this -I also encouraged weightbearing exercise as tolerated.  She is doing some of these during Pilates.  She is also walking. -At today's visit we will check a BMP and a vitamin D level -She stopped her calcium citrate since last visit and we discussed about getting 1200 mg calcium from the diet preferentially.  If she cannot get this with the diet, she needs to supplement.  However, she is having a good diet including almond milk, greens, legumes, and nuts. -Plan to do another bone density scan in 3 years from the previous -I will see her back in a year  Component     Latest Ref Rng & Units 05/03/2021  Sodium     135 - 145 mEq/L 138  Potassium     3.5 - 5.1 mEq/L 4.9  Chloride     96 - 112 mEq/L 102  CO2     19 - 32 mEq/L 30  Glucose     70 - 99 mg/dL 90  BUN     6 - 23 mg/dL 27 (H)  Creatinine     0.40 - 1.20 mg/dL 0.83  Calcium     8.4 - 10.5 mg/dL 9.5  GFR     >60.00 mL/min 67.23  Vitamin D, 25-Hydroxy     30.0 - 100.0 ng/mL 26.6 (L)  Sodium is normal. Vitamin D level is slightly low.  I would advise her to take 5000 units vitamin D3 every day and return for recheck in 2 months.  Philemon Kingdom, MD PhD [facility]  Endocrinology

## 2021-05-03 NOTE — Patient Instructions (Addendum)
Please stop at the lab.  Try to get 1200 mg calcium a day preferentially from the diet.  Please come back for a follow-up appointment in 1 year.

## 2021-05-04 DIAGNOSIS — M5416 Radiculopathy, lumbar region: Secondary | ICD-10-CM | POA: Diagnosis not present

## 2021-05-04 DIAGNOSIS — M4135 Thoracogenic scoliosis, thoracolumbar region: Secondary | ICD-10-CM | POA: Diagnosis not present

## 2021-05-04 DIAGNOSIS — M21371 Foot drop, right foot: Secondary | ICD-10-CM | POA: Diagnosis not present

## 2021-05-04 LAB — VITAMIN D 25 HYDROXY (VIT D DEFICIENCY, FRACTURES): Vit D, 25-Hydroxy: 26.6 ng/mL — ABNORMAL LOW (ref 30.0–100.0)

## 2021-05-11 DIAGNOSIS — M21371 Foot drop, right foot: Secondary | ICD-10-CM | POA: Diagnosis not present

## 2021-05-11 DIAGNOSIS — M5416 Radiculopathy, lumbar region: Secondary | ICD-10-CM | POA: Diagnosis not present

## 2021-05-11 DIAGNOSIS — M4135 Thoracogenic scoliosis, thoracolumbar region: Secondary | ICD-10-CM | POA: Diagnosis not present

## 2021-05-18 DIAGNOSIS — M21371 Foot drop, right foot: Secondary | ICD-10-CM | POA: Diagnosis not present

## 2021-05-18 DIAGNOSIS — M5416 Radiculopathy, lumbar region: Secondary | ICD-10-CM | POA: Diagnosis not present

## 2021-05-18 DIAGNOSIS — M4135 Thoracogenic scoliosis, thoracolumbar region: Secondary | ICD-10-CM | POA: Diagnosis not present

## 2021-05-21 DIAGNOSIS — M5416 Radiculopathy, lumbar region: Secondary | ICD-10-CM | POA: Diagnosis not present

## 2021-05-21 DIAGNOSIS — M21371 Foot drop, right foot: Secondary | ICD-10-CM | POA: Diagnosis not present

## 2021-05-21 DIAGNOSIS — M4135 Thoracogenic scoliosis, thoracolumbar region: Secondary | ICD-10-CM | POA: Diagnosis not present

## 2021-05-23 DIAGNOSIS — N8110 Cystocele, unspecified: Secondary | ICD-10-CM | POA: Diagnosis not present

## 2021-05-23 DIAGNOSIS — N3000 Acute cystitis without hematuria: Secondary | ICD-10-CM | POA: Diagnosis not present

## 2021-05-29 ENCOUNTER — Other Ambulatory Visit: Payer: Self-pay | Admitting: Neurology

## 2021-05-29 DIAGNOSIS — G40009 Localization-related (focal) (partial) idiopathic epilepsy and epileptic syndromes with seizures of localized onset, not intractable, without status epilepticus: Secondary | ICD-10-CM

## 2021-05-30 DIAGNOSIS — M4186 Other forms of scoliosis, lumbar region: Secondary | ICD-10-CM | POA: Diagnosis not present

## 2021-05-30 DIAGNOSIS — R338 Other retention of urine: Secondary | ICD-10-CM | POA: Diagnosis not present

## 2021-05-30 DIAGNOSIS — N8110 Cystocele, unspecified: Secondary | ICD-10-CM | POA: Diagnosis not present

## 2021-06-01 DIAGNOSIS — M21371 Foot drop, right foot: Secondary | ICD-10-CM | POA: Diagnosis not present

## 2021-06-01 DIAGNOSIS — M4135 Thoracogenic scoliosis, thoracolumbar region: Secondary | ICD-10-CM | POA: Diagnosis not present

## 2021-06-01 DIAGNOSIS — M5416 Radiculopathy, lumbar region: Secondary | ICD-10-CM | POA: Diagnosis not present

## 2021-06-06 IMAGING — MR MR LUMBAR SPINE WO/W CM
7 series · 48 of 48 positions shown · IV contrast (multihance)
Comparison: 07/22/2018

CLINICAL DATA: Right foot drop

EXAM:
MRI LUMBAR SPINE WITHOUT AND WITH CONTRAST
TECHNIQUE: Multiplanar and multiecho pulse sequences of the lumbar spine were
obtained without and with intravenous contrast.
CONTRAST:  9mL MULTIHANCE GADOBENATE DIMEGLUMINE 529 MG/ML IV SOLN

[Series 3: tirm sag · sagittal · 4.0mm · 0.55mm/px · 4 of 14 slices shown]
[im 1/14]
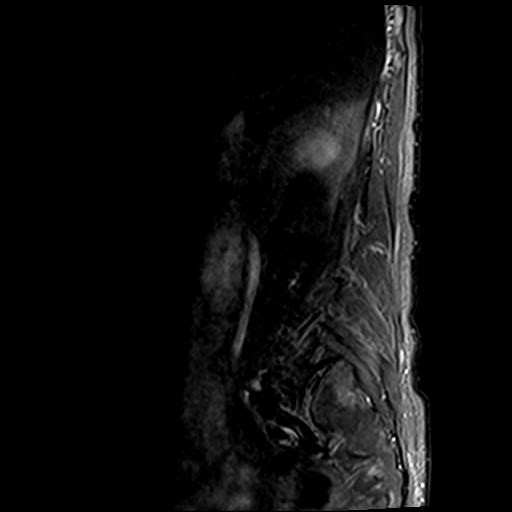
[im 5/14]
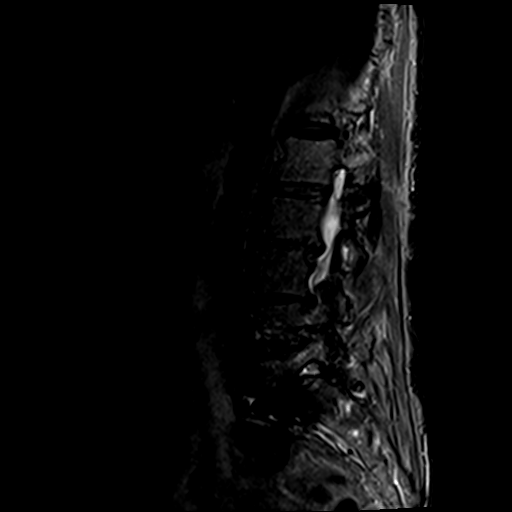
[im 9/14]
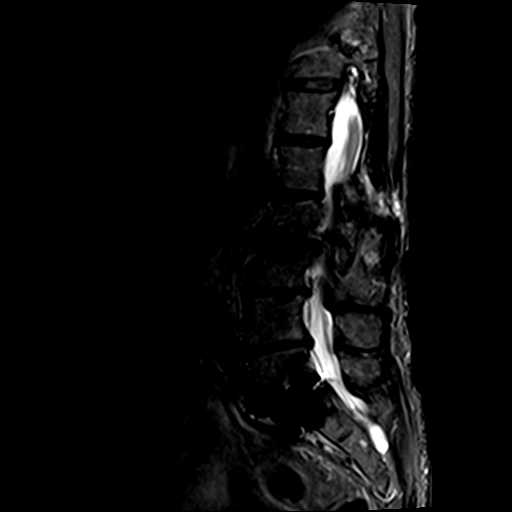
[im 14/14]
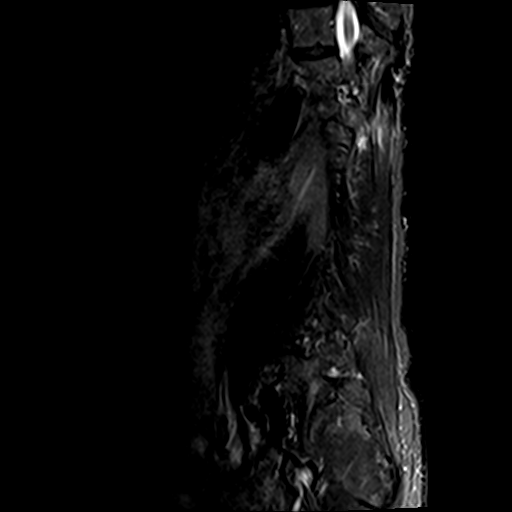

[Series 4: T1 · sagittal · 4.0mm · 1.09mm/px · 4 of 14 slices shown (1 of 2)]
[im 1/14]
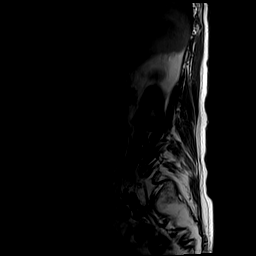
[im 5/14]
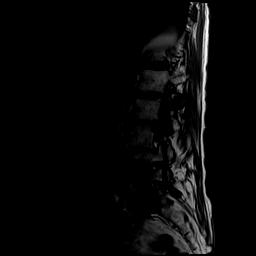
[im 9/14]
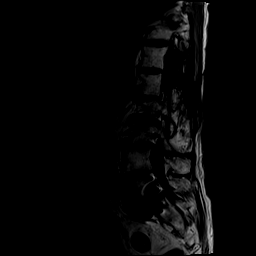
[im 14/14]
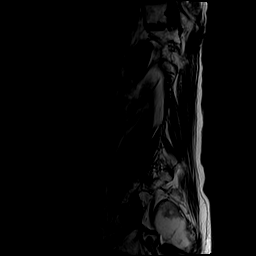

[Series 5: T1 · axial · 4.0mm · 1.04mm/px · z∈[-87,+99]mm · 10 of 30 slices shown (2 of 2)]
[im 1/30]
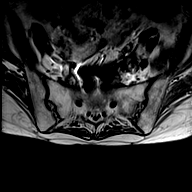
[im 4/30]
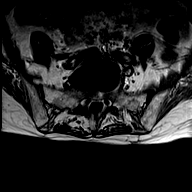
[im 7/30]
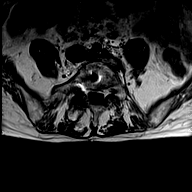
[im 10/30]
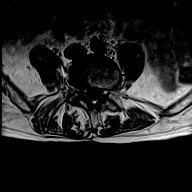
[im 13/30]
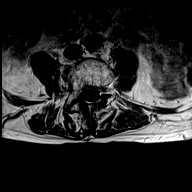
[im 17/30]
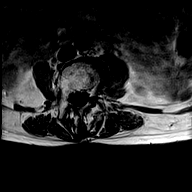
[im 20/30]
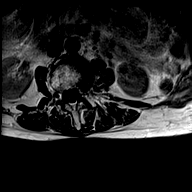
[im 23/30]
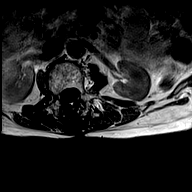
[im 26/30]
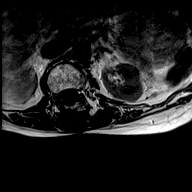
[im 30/30]
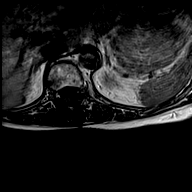

[Series 6: T2 · axial · 4.0mm · 1.04mm/px · z∈[-87,+99]mm · 10 of 30 slices shown]
[im 1/30]
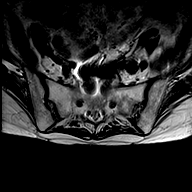
[im 4/30]
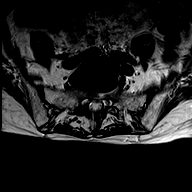
[im 7/30]
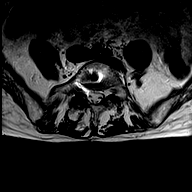
[im 10/30]
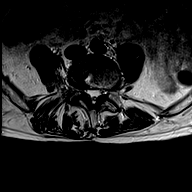
[im 13/30]
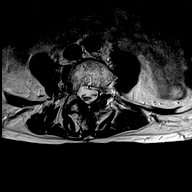
[im 17/30]
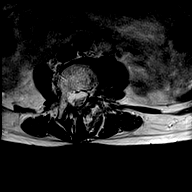
[im 20/30]
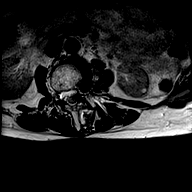
[im 23/30]
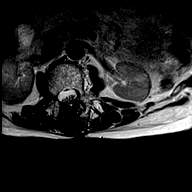
[im 26/30]
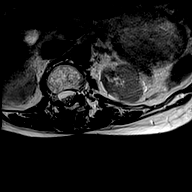
[im 30/30]
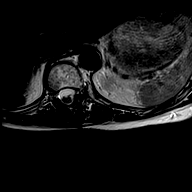

[Series 7: T2 post-contrast · sagittal · 4.0mm · 1.09mm/px · 5 of 14 slices shown]
[im 1/14]
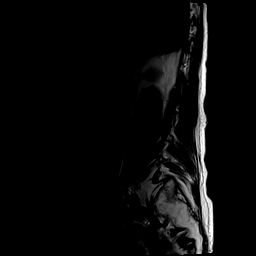
[im 4/14]
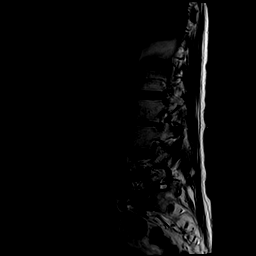
[im 7/14]
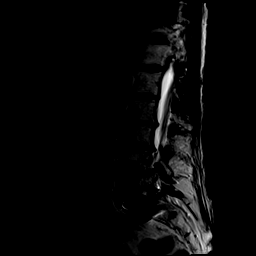
[im 10/14]
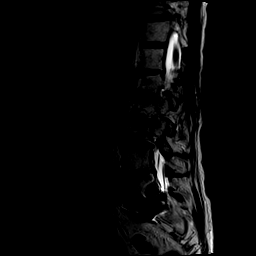
[im 14/14]
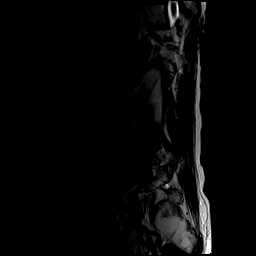

[Series 8: T1 fat-sat post-contrast · sagittal · 4.0mm · 1.09mm/px · 5 of 14 slices shown]
[im 1/14]
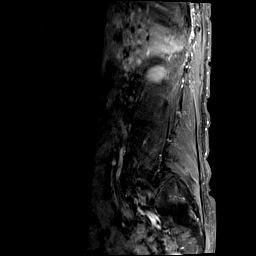
[im 4/14]
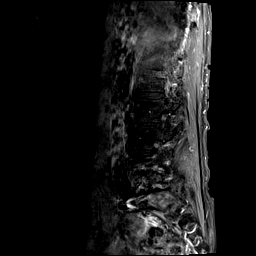
[im 7/14]
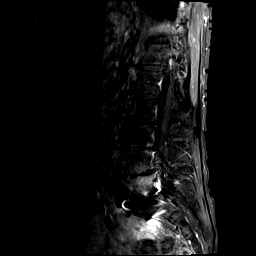
[im 10/14]
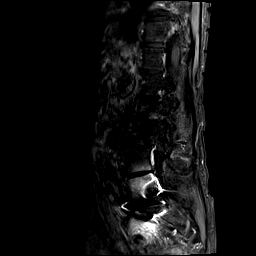
[im 14/14]
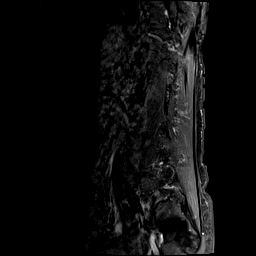

[Series 9: T1 post-contrast · axial · 4.0mm · 1.04mm/px · z∈[-87,+99]mm · 10 of 30 slices shown]
[im 1/30]
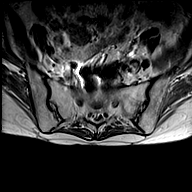
[im 4/30]
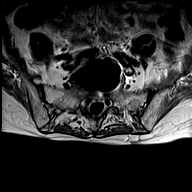
[im 7/30]
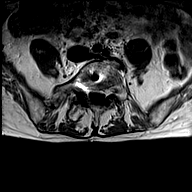
[im 10/30]
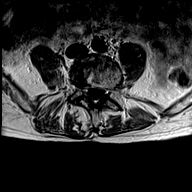
[im 13/30]
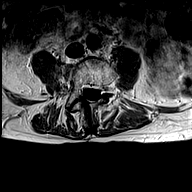
[im 17/30]
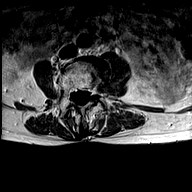
[im 20/30]
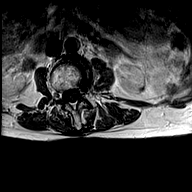
[im 23/30]
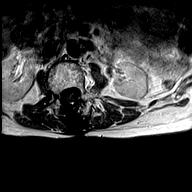
[im 26/30]
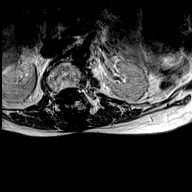
[im 30/30]
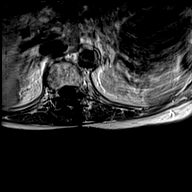

[48 of 48 positions shown; findings below may reference images not displayed]

FINDINGS: Segmentation:  Standard lumbar numbering

Alignment: Thoracic dextroscoliosis and lower lumbar levoscoliosis.
Fused anterolisthesis at L5-S1

Vertebrae: No evidence of fracture, discitis, or aggressive bone
lesion.

Conus medullaris and cauda equina: Conus extends to the L1-2 level.
Conus and cauda equina appear normal. There is history of spinal
tumor resection presumably related to laminectomy at the thoracic
lumbar junction. No abnormal enhancement or masslike finding at this
level.

Paraspinal and other soft tissues: Expected postoperative scarring

Disc levels:

T12- L1: Small right paracentral protrusion suggested on sagittal
images. Mild facet spurring. No evidence of impingement

L1-L2: Mild facet spurring and disc bulging. No evidence of
impingement

L2-L3: Advanced disc narrowing asymmetric to the left where there is
greater bulging and endplate ridging. Degenerative facet spurring.
Moderate left foraminal stenosis. Mild spinal and left subarticular
recess narrowing

L3-L4: Significant degenerative disc narrowing and bulging with
facet spurring asymmetric to the right. Mild borderline moderate
spinal stenosis. Mild right foraminal narrowing

L4-L5: Disc degeneration with asymmetric right-sided height loss,
bulging, and ridging. Asymmetric right facet spurring. High-grade
right foraminal stenosis

L5-S1:Interval ALIF. Arthrodesis status is uncertain. There could be
cage subsidence because there is still prominent foraminal narrowing
asymmetric to the right and accentuated by listhesis, with right L5
flattening. Bilateral subarticular recess stenosis worse on the
right where there is S1 impingement
IMPRESSION: 1. Advanced degenerative disease with scoliosis and L5-S1 listhesis.
2. L5-S1 interval ALIF. Question cage subsidence as there is still
compressive right foraminal narrowing. Subarticular recess narrowing
is also notable at this level with asymmetric right S1 impingement.
3. L4-5 right foraminal impingement
4. L2-3 moderate left foraminal narrowing.

## 2021-06-08 DIAGNOSIS — M5416 Radiculopathy, lumbar region: Secondary | ICD-10-CM | POA: Diagnosis not present

## 2021-06-08 DIAGNOSIS — M21371 Foot drop, right foot: Secondary | ICD-10-CM | POA: Diagnosis not present

## 2021-06-08 DIAGNOSIS — R030 Elevated blood-pressure reading, without diagnosis of hypertension: Secondary | ICD-10-CM | POA: Diagnosis not present

## 2021-06-08 DIAGNOSIS — M4135 Thoracogenic scoliosis, thoracolumbar region: Secondary | ICD-10-CM | POA: Diagnosis not present

## 2021-06-13 DIAGNOSIS — M1990 Unspecified osteoarthritis, unspecified site: Secondary | ICD-10-CM | POA: Diagnosis not present

## 2021-06-13 DIAGNOSIS — Z880 Allergy status to penicillin: Secondary | ICD-10-CM | POA: Diagnosis not present

## 2021-06-13 DIAGNOSIS — Z79899 Other long term (current) drug therapy: Secondary | ICD-10-CM | POA: Diagnosis not present

## 2021-06-13 DIAGNOSIS — G40909 Epilepsy, unspecified, not intractable, without status epilepticus: Secondary | ICD-10-CM | POA: Diagnosis not present

## 2021-06-13 DIAGNOSIS — R030 Elevated blood-pressure reading, without diagnosis of hypertension: Secondary | ICD-10-CM | POA: Diagnosis not present

## 2021-06-13 DIAGNOSIS — M810 Age-related osteoporosis without current pathological fracture: Secondary | ICD-10-CM | POA: Diagnosis not present

## 2021-06-13 DIAGNOSIS — Z8249 Family history of ischemic heart disease and other diseases of the circulatory system: Secondary | ICD-10-CM | POA: Diagnosis not present

## 2021-06-13 DIAGNOSIS — N393 Stress incontinence (female) (male): Secondary | ICD-10-CM | POA: Diagnosis not present

## 2021-06-13 DIAGNOSIS — G8929 Other chronic pain: Secondary | ICD-10-CM | POA: Diagnosis not present

## 2021-06-15 DIAGNOSIS — M4135 Thoracogenic scoliosis, thoracolumbar region: Secondary | ICD-10-CM | POA: Diagnosis not present

## 2021-06-15 DIAGNOSIS — M21371 Foot drop, right foot: Secondary | ICD-10-CM | POA: Diagnosis not present

## 2021-06-15 DIAGNOSIS — M5416 Radiculopathy, lumbar region: Secondary | ICD-10-CM | POA: Diagnosis not present

## 2021-06-18 ENCOUNTER — Encounter (HOSPITAL_BASED_OUTPATIENT_CLINIC_OR_DEPARTMENT_OTHER): Payer: Self-pay | Admitting: Obstetrics & Gynecology

## 2021-06-18 ENCOUNTER — Other Ambulatory Visit: Payer: Self-pay

## 2021-06-18 ENCOUNTER — Ambulatory Visit (HOSPITAL_BASED_OUTPATIENT_CLINIC_OR_DEPARTMENT_OTHER): Payer: Medicare PPO | Admitting: Obstetrics & Gynecology

## 2021-06-18 VITALS — BP 147/60 | HR 86 | Ht 60.0 in | Wt 100.6 lb

## 2021-06-18 DIAGNOSIS — R238 Other skin changes: Secondary | ICD-10-CM | POA: Diagnosis not present

## 2021-06-18 DIAGNOSIS — N3281 Overactive bladder: Secondary | ICD-10-CM | POA: Diagnosis not present

## 2021-06-18 DIAGNOSIS — N8111 Cystocele, midline: Secondary | ICD-10-CM

## 2021-06-18 NOTE — Progress Notes (Signed)
80 y.o. Widowed White female G1P0 here for pessary check.  She reports no vaginal discharge or bleeding.  Continues to have urinary leakage.  Wears a pad for this.  At times, the pad is very soaked.  She does have some intermittent skin iriritation that she thinks is due to the pad or urine.  Has used vaseline.  Would like additional recommendations.  She is not sexually active.    Patient has been using following pessary style and size:  incontinence ring with support #3.  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   Urinary tract infection    Wears hearing aid     Current Outpatient Medications on File Prior to Visit  Medication Sig Dispense Refill   Acetylcysteine (N-ACETYL-L-CYSTEINE PO) Take 500 mg by mouth daily.     cyanocobalamin 2000 MCG tablet Take 2,000 mcg by mouth daily.     diclofenac sodium (VOLTAREN) 1 % GEL Apply 1 application topically 4 (four) times daily as needed (pain).      L-Theanine 200 MG CAPS Take 200 mg by mouth 2 (two) times daily.     lamoTRIgine (LAMICTAL) 25 MG tablet TAKE 1 TABLET(25 MG) BY MOUTH EVERY EVENING 90 tablet 0   MAGNESIUM PO Take 1 tablet by mouth daily.     Multiple Vitamins-Minerals (ZINC PO) Take 2.5 mg by mouth daily.     NONFORMULARY OR COMPOUNDED ITEM Vitamin E vaginal cream 200u/ml.  One ml pv twice weekly.  Disp. 3 month supply. 24 each 3   pyridoxine (B-6) 200 MG tablet Take 200 mg by mouth daily.     ST JOHNS WORT PO Take 1.8 g by mouth 2 (two) times daily.     traMADol (ULTRAM) 50 MG tablet Take 50 mg by mouth daily.     Vibegron (GEMTESA) 75 MG  TABS Take 75 mg by mouth every evening.      Vitamin D-Vitamin K (VITAMIN K2-VITAMIN D3 PO) Take 1 capsule by mouth daily.     CALCIUM PO Take 2.5 mLs by mouth daily. Liquid calcium (Patient not taking: Reported on 75/64/3329)     GARLIC PO Take 5,188 mg by mouth daily. (Patient not taking: Reported on 06/18/2021)     No current facility-administered medications on file prior to visit.    Allergies  Allergen Reactions   Ibuprofen-Famotidine Other (See Comments)    Elevated BP   Fentanyl Other (See Comments)    Constipates for about 2 weeks   Pregabalin Swelling    AMS, Swelling   Penicillins Rash    Did it involve swelling of the face/tongue/throat, SOB, or low BP? No Did it involve sudden or severe rash/hives, skin peeling, or any reaction on the inside of your mouth or nose? No Did you need to seek medical attention at a hospital or doctor's office? No When did it last happen?      30+ years If all above answers are "NO", may proceed with cephalosporin use.  Review of Systems  Constitutional: Negative.   Genitourinary: Negative.    Exam:   BP (!) 147/60 (BP Location: Right Arm, Patient Position: Sitting, Cuff Size: Normal)   Pulse 86   Ht 5' (1.524 m) Comment: reported  Wt 100 lb 9.6 oz (45.6 kg)   LMP  (LMP Unknown)   BMI 19.65 kg/m   General appearance: alert and no distress Inguinal lymph nodes:  not enlarged  Pelvic: External genitalia:  no lesions              Urethra: normal appearing urethra with no masses, tenderness or lesions              Bartholins and Skenes: Bartholin's, Urethra, Skene's normal                 Vagina: normal appearing vagina with normal color and discharge, no lesions              Cervix: absent   Pap obtained:  no Bimanual Exam:  Uterus:  surgically absent, vaginal cuff well healed                               Adnexa:    normal adnexa in size, nontender and no masses                               Anus:  normal sphincter tone, no  lesions  Pessary was removed without difficulty.  Pessary was cleansed.  Pessary was replaced. Patient tolerated procedure well.    Assessment/Plan: 1. Midline cystocele - Pessary removed, cleansed and replaced. - Recheck 3 months  2. OAB (overactive bladder) - has done pelvic PT and this has helped  3. Skin irritation - topical aquaphor discussed in place of vaseline as long as irritation is just intermittent.  If persists, would recommend follow up.

## 2021-06-19 DIAGNOSIS — M5416 Radiculopathy, lumbar region: Secondary | ICD-10-CM | POA: Diagnosis not present

## 2021-06-19 DIAGNOSIS — M5116 Intervertebral disc disorders with radiculopathy, lumbar region: Secondary | ICD-10-CM | POA: Diagnosis not present

## 2021-06-19 DIAGNOSIS — M545 Low back pain, unspecified: Secondary | ICD-10-CM | POA: Diagnosis not present

## 2021-06-19 DIAGNOSIS — M419 Scoliosis, unspecified: Secondary | ICD-10-CM | POA: Diagnosis not present

## 2021-06-19 DIAGNOSIS — M21371 Foot drop, right foot: Secondary | ICD-10-CM | POA: Diagnosis not present

## 2021-06-27 DIAGNOSIS — M5416 Radiculopathy, lumbar region: Secondary | ICD-10-CM | POA: Diagnosis not present

## 2021-06-27 DIAGNOSIS — M4135 Thoracogenic scoliosis, thoracolumbar region: Secondary | ICD-10-CM | POA: Diagnosis not present

## 2021-06-27 DIAGNOSIS — M21371 Foot drop, right foot: Secondary | ICD-10-CM | POA: Diagnosis not present

## 2021-07-06 DIAGNOSIS — M4135 Thoracogenic scoliosis, thoracolumbar region: Secondary | ICD-10-CM | POA: Diagnosis not present

## 2021-07-06 DIAGNOSIS — M5416 Radiculopathy, lumbar region: Secondary | ICD-10-CM | POA: Diagnosis not present

## 2021-07-06 DIAGNOSIS — M21371 Foot drop, right foot: Secondary | ICD-10-CM | POA: Diagnosis not present

## 2021-07-20 DIAGNOSIS — M5416 Radiculopathy, lumbar region: Secondary | ICD-10-CM | POA: Diagnosis not present

## 2021-07-20 DIAGNOSIS — M4135 Thoracogenic scoliosis, thoracolumbar region: Secondary | ICD-10-CM | POA: Diagnosis not present

## 2021-07-20 DIAGNOSIS — M21371 Foot drop, right foot: Secondary | ICD-10-CM | POA: Diagnosis not present

## 2021-08-01 DIAGNOSIS — L718 Other rosacea: Secondary | ICD-10-CM | POA: Diagnosis not present

## 2021-08-01 DIAGNOSIS — Z85828 Personal history of other malignant neoplasm of skin: Secondary | ICD-10-CM | POA: Diagnosis not present

## 2021-08-02 DIAGNOSIS — M5416 Radiculopathy, lumbar region: Secondary | ICD-10-CM | POA: Diagnosis not present

## 2021-08-02 DIAGNOSIS — M4135 Thoracogenic scoliosis, thoracolumbar region: Secondary | ICD-10-CM | POA: Diagnosis not present

## 2021-08-02 DIAGNOSIS — M21371 Foot drop, right foot: Secondary | ICD-10-CM | POA: Diagnosis not present

## 2021-08-03 DIAGNOSIS — G8929 Other chronic pain: Secondary | ICD-10-CM | POA: Diagnosis not present

## 2021-08-03 DIAGNOSIS — F1120 Opioid dependence, uncomplicated: Secondary | ICD-10-CM | POA: Diagnosis not present

## 2021-08-03 DIAGNOSIS — M545 Low back pain, unspecified: Secondary | ICD-10-CM | POA: Diagnosis not present

## 2021-08-03 DIAGNOSIS — M5416 Radiculopathy, lumbar region: Secondary | ICD-10-CM | POA: Diagnosis not present

## 2021-08-10 DIAGNOSIS — M4180 Other forms of scoliosis, site unspecified: Secondary | ICD-10-CM | POA: Diagnosis not present

## 2021-08-10 DIAGNOSIS — M5416 Radiculopathy, lumbar region: Secondary | ICD-10-CM | POA: Diagnosis not present

## 2021-08-17 DIAGNOSIS — M4135 Thoracogenic scoliosis, thoracolumbar region: Secondary | ICD-10-CM | POA: Diagnosis not present

## 2021-08-17 DIAGNOSIS — M21371 Foot drop, right foot: Secondary | ICD-10-CM | POA: Diagnosis not present

## 2021-08-17 DIAGNOSIS — M5416 Radiculopathy, lumbar region: Secondary | ICD-10-CM | POA: Diagnosis not present

## 2021-08-18 NOTE — Telephone Encounter (Signed)
Last Prolia inj 03/13/21 Next Prolia inj due 09/14/21

## 2021-08-31 ENCOUNTER — Other Ambulatory Visit: Payer: Self-pay | Admitting: Neurology

## 2021-08-31 DIAGNOSIS — G40009 Localization-related (focal) (partial) idiopathic epilepsy and epileptic syndromes with seizures of localized onset, not intractable, without status epilepticus: Secondary | ICD-10-CM

## 2021-09-02 NOTE — Telephone Encounter (Signed)
Prior Auth initiated via [email or web address] KEY: [id number]

## 2021-09-12 DIAGNOSIS — N8110 Cystocele, unspecified: Secondary | ICD-10-CM | POA: Diagnosis not present

## 2021-09-12 DIAGNOSIS — N3946 Mixed incontinence: Secondary | ICD-10-CM | POA: Diagnosis not present

## 2021-09-13 NOTE — Telephone Encounter (Signed)
Prior auth APPROVED ° ° ° ° °

## 2021-09-13 NOTE — Telephone Encounter (Signed)
Pt ready for scheduling on or after 09/14/21  Out-of-pocket cost due at time of visit: $0.00  Primary: [employer] Prolia co-insurance: 0% Admin fee co-insurance: 0%  Secondary: n/a Prolia co-insurance:  Admin fee co-insurance:   Deductible: does not apply  Prior Auth: APPROVED PA# [id number] Valid: 11/12/19-09/01/22    ** This summary of benefits is an estimation of the patient's out-of-pocket cost. Exact cost may very based on individual plan coverage.

## 2021-09-20 ENCOUNTER — Encounter (HOSPITAL_BASED_OUTPATIENT_CLINIC_OR_DEPARTMENT_OTHER): Payer: Self-pay | Admitting: Obstetrics & Gynecology

## 2021-09-20 ENCOUNTER — Ambulatory Visit (HOSPITAL_BASED_OUTPATIENT_CLINIC_OR_DEPARTMENT_OTHER): Payer: Medicare PPO | Admitting: Obstetrics & Gynecology

## 2021-09-20 ENCOUNTER — Other Ambulatory Visit: Payer: Self-pay

## 2021-09-20 VITALS — BP 125/62 | HR 88 | Ht 60.0 in | Wt 102.8 lb

## 2021-09-20 DIAGNOSIS — Z4689 Encounter for fitting and adjustment of other specified devices: Secondary | ICD-10-CM

## 2021-09-20 DIAGNOSIS — N3281 Overactive bladder: Secondary | ICD-10-CM

## 2021-09-20 DIAGNOSIS — N8111 Cystocele, midline: Secondary | ICD-10-CM

## 2021-09-20 DIAGNOSIS — Z960 Presence of urogenital implants: Secondary | ICD-10-CM

## 2021-09-20 MED ORDER — NONFORMULARY OR COMPOUNDED ITEM: 3 refills | Status: DC

## 2021-09-20 NOTE — Progress Notes (Signed)
81 y.o. Widowed White female G1P1 here for pessary check.  She reports continued urinary leakage.  She has been told she is not a candidate for bladder botox.  She reports she stopped the [facility] for a few weeks and, initially, her leaking was improved.  However, that did not last long.  She has asked about other options and has been told, she is not a candidate for anything else.  We discussed possible urogynecology consult for second opinion.  She may need to get records from urology as she has done urodynamics.  She is open to this.  She is not sexually active.  Denies vaginal discharge or vaginal odor.  Uses vit e vaginal suppositories without any difficulty.  Does need RF. Denies vaginal bleeding as well.  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   Urinary tract infection    Wears hearing aid     Current Outpatient Medications on File Prior to Visit  Medication Sig Dispense Refill   Acetylcysteine (N-ACETYL-L-CYSTEINE PO) Take 500 mg by mouth daily.     CALCIUM PO Take 2.5 mLs by mouth daily. Liquid calcium     cyanocobalamin 2000 MCG tablet Take 2,000 mcg by mouth daily.     GARLIC PO Take 6,270 mg by mouth daily.     lamoTRIgine (LAMICTAL) 25 MG tablet TAKE 1 TABLET(25 MG) BY MOUTH EVERY EVENING 60 tablet 0   MAGNESIUM PO Take 1 tablet by mouth daily.     Multiple Vitamins-Minerals (ZINC PO) Take 2.5 mg by mouth daily.     pyridoxine (B-6) 200 MG tablet Take 200 mg by mouth daily.     traMADol (ULTRAM) 50 MG tablet Take 50 mg by mouth daily.     Vibegron (GEMTESA) 75 MG TABS Take 75 mg by mouth  every evening.      Vitamin D-Vitamin K (VITAMIN K2-VITAMIN D3 PO) Take 1 capsule by mouth daily.     No current facility-administered medications on file prior to visit.    Allergies  Allergen Reactions   Ibuprofen-Famotidine Other (See Comments)    Elevated BP   Fentanyl Other (See Comments)    Constipates for about 2 weeks   Pregabalin Swelling    AMS, Swelling   Penicillins Rash    Did it involve swelling of the face/tongue/throat, SOB, or low BP? No Did it involve sudden or severe rash/hives, skin peeling, or any reaction on the inside of your mouth or nose? No Did you need to seek medical attention at a hospital or doctor's office? No When did it last happen?      30+ years If all above answers are NO, may proceed with cephalosporin use.     Review of Systems  Constitutional: Negative.   Genitourinary:  Positive for frequency.       Urinary incontinence   Exam:   BP 125/62    Pulse 88    Ht 5' (1.524 m)    Wt 102 lb 12.8  oz (46.6 kg)    LMP  (LMP Unknown)    BMI 20.08 kg/m   General appearance: alert and no distress Inguinal lymph nodes:  not enlarged  Pelvic: External genitalia:  no lesions              Urethra: normal appearing urethra with no masses, tenderness or lesions              Bartholins and Skenes: Bartholin's, Urethra, Skene's normal                 Vagina: normal appearing vagina with normal color and discharge, no lesions              Cervix: absent   Pap obtained:  no Bimanual Exam:  Uterus:  surgically absent, vaginal cuff well healed                               Pessary was removed without difficulty.  Pessary was cleansed.  Pessary was replaced. Patient tolerated procedure well.    Assessment/Plan: 1. Midline cystocele - Ambulatory referral to Urogynecology  2. Pessary maintenance - NONFORMULARY OR COMPOUNDED ITEM; Vitamin E vaginal cream 200u/ml.  One ml pv twice weekly.  Disp. 3 month supply.  Dispense: 24 each; Refill: 3  3. OAB  (overactive bladder)  4. Presence of pessary - recheck 3 months

## 2021-09-23 ENCOUNTER — Encounter (HOSPITAL_BASED_OUTPATIENT_CLINIC_OR_DEPARTMENT_OTHER): Payer: Self-pay | Admitting: Obstetrics & Gynecology

## 2021-10-02 DIAGNOSIS — H903 Sensorineural hearing loss, bilateral: Secondary | ICD-10-CM | POA: Diagnosis not present

## 2021-10-02 DIAGNOSIS — H6123 Impacted cerumen, bilateral: Secondary | ICD-10-CM | POA: Diagnosis not present

## 2021-10-02 DIAGNOSIS — Z974 Presence of external hearing-aid: Secondary | ICD-10-CM | POA: Diagnosis not present

## 2021-10-02 NOTE — Telephone Encounter (Signed)
LVM to schedule Prolia Injection.

## 2021-10-05 ENCOUNTER — Telehealth: Payer: Self-pay | Admitting: Internal Medicine

## 2021-10-05 NOTE — Telephone Encounter (Signed)
Pt scheduled for Prolia.

## 2021-10-05 NOTE — Telephone Encounter (Signed)
Pt is calling to get scheduled for her prolia injection and wanted to know if and what amount her co-pay is for this injection and if she needs to have labs as well.  Pt would like to have a call back with the following information, so that she is able to schedule her appointment.

## 2021-10-08 ENCOUNTER — Telehealth (INDEPENDENT_AMBULATORY_CARE_PROVIDER_SITE_OTHER): Payer: Medicare PPO | Admitting: Neurology

## 2021-10-08 ENCOUNTER — Encounter: Payer: Self-pay | Admitting: Neurology

## 2021-10-08 ENCOUNTER — Ambulatory Visit: Payer: Medicare PPO | Admitting: Neurology

## 2021-10-08 ENCOUNTER — Other Ambulatory Visit: Payer: Self-pay

## 2021-10-08 VITALS — Ht 60.0 in | Wt 100.0 lb

## 2021-10-08 DIAGNOSIS — G40009 Localization-related (focal) (partial) idiopathic epilepsy and epileptic syndromes with seizures of localized onset, not intractable, without status epilepticus: Secondary | ICD-10-CM

## 2021-10-08 MED ORDER — LAMOTRIGINE 25 MG PO TABS: ORAL_TABLET | ORAL | 3 refills | Status: DC

## 2021-10-08 NOTE — Progress Notes (Signed)
Virtual Visit via Video Note The purpose of this virtual visit is to provide medical care while limiting exposure to the novel coronavirus.    Consent was obtained for video visit:  Yes.   Answered questions that patient had about telehealth interaction:  Yes.   I discussed the limitations, risks, security and privacy concerns of performing an evaluation and management service by telemedicine. I also discussed with the patient that there may be a patient responsible charge related to this service. The patient expressed understanding and agreed to proceed.  Pt location: Home Physician Location: office Name of referring provider:  Burnis Medin, MD I connected with Kaitlyn Flowers at patients initiation/request on 10/08/2021 at  4:00 PM EST by video enabled telemedicine application and verified that I am speaking with the correct person using two identifiers. Pt MRN:  [id number] Pt DOB:  [birth year] Video Participants:  Kaitlyn Flowers   History of Present Illness:  The patient had a virtual video visit on 10/08/2021. She was last seen in the Neurology clinic in July 2021 for seizures. She had a syncopal episode in 03/2016, 2 episodes of transient aphasia in 03/2016 and 07/2016. Stroke workup negative, her EEG in 2017 was abnormal with bialteral temporal slowing and left frontotemporal sharp waves. She has not tolerated higher doses of Lamotrigine, and takes low dose Lamotrigine 25mg  qhs with no further events since 2017. She denies any staring/unresponsive episodes, speech difficulties, confusion, gaps in time, myoclonic jerks. She denies any headaches, dizziness, vision changes, no falls. She continues to deal with back pain all the time, her scoliosis is getting worse. She continues to do exercise, meditation, and takes Tramadol once a day. Rarely does she take 2 doses.    History On Initial Assessment 05/29/2016: This is a very pleasant 81 yo RH woman who presented after a syncopal episode on  03/03/16 then a transient episode of aphasia on 03/25/16. She lives alone and was sitting outside last 03/03/16. She recalls turning around to get water, then blacked out. She woke up on the ground and found her glasses broken, she hit the right side of her face. No tongue bite/incontinence. She denied any prodromal symptoms, no dizziness or headaches. She had been drinking a lot of water due to recurrent UTIs since January 2017. She went to the ER, it was felt fall was likely a result of orthostatic hypotension from sitting out in the heat. No bloodwork on record from that visit. Then on 03/25/16 she was reading something off her phone and could not make sense of it. She showed the phone to her friend who said it looked fine. Then she found she could not "get/grab my words." Her friend drove her to the ER, she felt her friend was driving the wrong way and she was thinking this, but could not say it. This lasted 20-30 minutes, no associated focal numbness/tingling/weakness, headaches, or dizziness. Her BP on arrival was 139/72. I personally reviewed head CT without contrast which did not show any acute changes. She was found to have a sodium level of 123. She was also diagnosed with another UTI with >100,000 aerococcus urinae. She denies any further recurrence of symptoms. She denies any olfactory/gustatory hallucinations, deja vu, rising epigastric sensation, focal numbness/tingling/weakness, myoclonic jerks. She has chronic back pain and self-catheterizes herself every night. She has chronic right leg numbness and weakness. For the past 1-1/2 years, she has had pain the right sacral area, worsened by sitting. She continues to work with PT.  She underwent a stroke workup, I personally reviewed images available. 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 MRA head did not show any intracranial stenosis. Carotid dopplers did not show any  evidence of ICA stenosis bilaterally. Her echocardiogram showed normal EF 60-65%, normal RV size and systolic function, no significant valvular abnormalities, left atrium normal size. Lipid panel showed an LDL of 114, total cholesterol 241. She started to take a daily baby aspirin.  She saw her PCP on 11/6 reporting random headaches and wondered if related to her BP. She was scheduled for an earlier appointment today, then yesterday had another transient episode of aphasia. 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 When she tried to speak, her words were jumbled. She states the aphasia lasted 30-40 minutes. A friend took her home then to her PCP office at noon, where bloodwork was done. She went to the ER at 11pm, reporting severe headache that started an hour prior. She had an intense pressure and throbbing over the left temporal region, 8/10, with nausea and vomiting. Headache lasted until this morning. No photo/phono/phonophobia. There was no aphasia noted in the ER, headache was treated with IV fluids and APAP. She denied any focal weakness or paresthesias. She denied having a headache with the previous episode of aphasia. She has no prior history of migraines. Over the past 2 weeks, she has been having random headaches localized on different parts of her head, occurring every 3-4 days, resolving after Tylenol intake.   Her mother had a stroke. Otherwise she had a normal birth and early development.  There is no history of febrile convulsions, CNS infections such as meningitis/encephalitis, significant traumatic brain injury, neurosurgical procedures, or family history of seizures.  Diagnostic Data: EEG in 07/2016: This awake and asleep EEG is abnormal due to the presence of: 1. Independent focal slowing seen over the bilateral temporal regions, left greater than right 2. Rare epileptiform discharges over the left frontotemporal  region   EEG in 09/2018:  This 1-hour awake and asleep EEG is abnormal due to the presence of: 1. Focal slowing over the bilateral temporal regions, left greater than right 2. Occasional epileptiform discharges over the left anterior temporal region    Current Outpatient Medications on File Prior to Visit  Medication Sig Dispense Refill   Acetylcysteine (N-ACETYL-L-CYSTEINE PO) Take 500 mg by mouth daily.     CALCIUM PO Take 2.5 mLs by mouth daily. Liquid calcium     cyanocobalamin 2000 MCG tablet Take 2,000 mcg by mouth daily.     GARLIC PO Take 9,381 mg by mouth daily.     lamoTRIgine (LAMICTAL) 25 MG tablet TAKE 1 TABLET(25 MG) BY MOUTH EVERY EVENING 60 tablet 0   MAGNESIUM PO Take 1 tablet by mouth daily.     Multiple Vitamins-Minerals (ZINC PO) Take 2.5 mg by mouth daily.     NONFORMULARY OR COMPOUNDED ITEM Vitamin E vaginal cream 200u/ml.  One ml pv twice weekly.  Disp. 3 month supply. 24 each 3   pyridoxine (B-6) 200 MG tablet Take 200 mg by mouth daily.     traMADol (ULTRAM) 50 MG tablet Take 50 mg by mouth daily.     Vibegron (GEMTESA) 75 MG TABS Take 75 mg by mouth every evening.      Vitamin D-Vitamin K (VITAMIN K2-VITAMIN D3 PO) Take 1 capsule  by mouth daily.     No current facility-administered medications on file prior to visit.     Observations/Objective:   Vitals:   10/08/21 1615  Weight: 100 lb (45.4 kg)  Height: 5' (1.524 m)   GEN:  The patient appears stated age and is in NAD.  Neurological examination: Patient is awake, alert. No aphasia or dysarthria. Intact fluency and comprehension. Cranial nerves: Extraocular movements intact with no nystagmus. No facial asymmetry. Motor: moves all extremities symmetrically, at least anti-gravity x 4.    Assessment and Plan:   This is a pleasant 80 yo RH woman who had a syncopal episode on 03/03/16, then a transient episode of expressive aphasia last 03/25/16. Stroke workup was unremarkable, MRI brain did not show any acute  changes, no significant stenosis seen intracranially or with carotid dopplers. Echo normal. She had another transient episode of aphasia on 07/21/16, this time followed by intense headache. No headache with previous event. Her EEG was abnormal with bilateral temporal slowing and rare left frontotemporal epileptiform discharges. 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 Repeat EEG in 09/2018 showed similar findings. She continues to do well with no further seizures since 2017 on very low dose of Lamotrigine 25mg  qhs, refills sent. She has chronic back pain and takes Tramadol, discussed weighing risks of lowering seizure threshold versus benefits, she knows to minimize as much as possible. She is aware of [place] driving laws to stop driving after a seizure until 6 months seizure-free. Follow-up in 1 year, call for any changes.    Follow Up Instructions:   -I discussed the assessment and treatment plan with the patient. The patient was provided an opportunity to ask questions and all were answered. The patient agreed with the plan and demonstrated an understanding of the instructions.   The patient was advised to call back or seek an in-person evaluation if the symptoms worsen or if the condition fails to improve as anticipated.     Cameron Sprang, MD

## 2021-10-08 NOTE — Patient Instructions (Addendum)
Good to see you. Continue Lamotrigine 25mg  every night, refills sent to your [facility] pharmacy on file. Follow-up in 1 year, call for any changes.   Seizure Precautions: 1. If medication has been prescribed for you to prevent seizures, take it exactly as directed.  Do not stop taking the medicine without talking to your doctor first, even if you have not had a seizure in a long time.   2. Avoid activities in which a seizure would cause danger to yourself or to others.  Don't operate dangerous machinery, swim alone, or climb in high or dangerous places, such as on ladders, roofs, or girders.  Do not drive unless your doctor says you may.  3. If you have any warning that you may have a seizure, lay down in a safe place where you can't hurt yourself.    4.  No driving for 6 months from last seizure, as per [facility].   Please refer to the following link on the [place] website for more information: [email or web address]   5.  Maintain good sleep hygiene. Avoid alcohol.  6.  Contact your doctor if you have any problems that may be related to the medicine you are taking.  7.  Call 911 and bring the patient back to the ED if:        A.  The seizure lasts longer than 5 minutes.       B.  The patient doesn't awaken shortly after the seizure  C.  The patient has new problems such as difficulty seeing, speaking or moving  D.  The patient was injured during the seizure  E.  The patient has a temperature over 102 F (39C)  F.  The patient vomited and now is having trouble breathing

## 2021-10-10 ENCOUNTER — Ambulatory Visit (INDEPENDENT_AMBULATORY_CARE_PROVIDER_SITE_OTHER): Payer: Medicare PPO

## 2021-10-10 ENCOUNTER — Other Ambulatory Visit: Payer: Self-pay

## 2021-10-10 DIAGNOSIS — M810 Age-related osteoporosis without current pathological fracture: Secondary | ICD-10-CM | POA: Diagnosis not present

## 2021-10-10 MED ORDER — DENOSUMAB 60 MG/ML ~~LOC~~ SOSY
60.0000 mg | PREFILLED_SYRINGE | Freq: Once | SUBCUTANEOUS | Status: AC
  Administered 2021-10-10: 60 mg via SUBCUTANEOUS

## 2021-10-10 NOTE — Progress Notes (Signed)
Prolia injection administered to pt's left arm. Pt tolerated well. °

## 2021-10-24 NOTE — Telephone Encounter (Signed)
Last Prolia inj 10/10/21 Next Prolia inj 04/10/22

## 2021-11-12 ENCOUNTER — Encounter: Payer: Self-pay | Admitting: Obstetrics and Gynecology

## 2021-11-12 ENCOUNTER — Ambulatory Visit: Payer: Medicare PPO | Admitting: Obstetrics and Gynecology

## 2021-11-12 ENCOUNTER — Other Ambulatory Visit: Payer: Self-pay

## 2021-11-12 VITALS — BP 120/77 | HR 83 | Ht 60.0 in | Wt 102.0 lb

## 2021-11-12 DIAGNOSIS — N8110 Cystocele, unspecified: Secondary | ICD-10-CM | POA: Diagnosis not present

## 2021-11-12 DIAGNOSIS — N393 Stress incontinence (female) (male): Secondary | ICD-10-CM | POA: Diagnosis not present

## 2021-11-12 DIAGNOSIS — N3281 Overactive bladder: Secondary | ICD-10-CM

## 2021-11-12 DIAGNOSIS — R350 Frequency of micturition: Secondary | ICD-10-CM | POA: Diagnosis not present

## 2021-11-12 LAB — POCT URINALYSIS DIPSTICK
Appearance: NORMAL
Bilirubin, UA: NEGATIVE
Blood, UA: NEGATIVE
Glucose, UA: NEGATIVE
Ketones, UA: NEGATIVE
Leukocytes, UA: NEGATIVE
Nitrite, UA: NEGATIVE
Protein, UA: NEGATIVE
Spec Grav, UA: 1.025 (ref 1.010–1.025)
Urobilinogen, UA: 0.2 E.U./dL
pH, UA: 5.5 (ref 5.0–8.0)

## 2021-11-12 NOTE — Progress Notes (Signed)
[facility] New Patient Evaluation and Consultation  Referring Provider: Megan Salon, MD PCP: Burnis Medin, MD Date of Service: 11/12/2021  SUBJECTIVE Chief Complaint: New Patient (Initial Visit) Kaitlyn Flowers is a 81 y.o. female here for incontinence and prolapse./)  History of Present Illness: Kaitlyn Flowers is a 81 y.o. White or Caucasian female seen in consultation at the request of Dr. Sabra Heck for evaluation of prolapse and incontinence.    Review of records from Dr Sabra Heck significant for: Has a pessary in place for prolapse #3 ring with support. She is on Gemtesa for urinary incontinence.   Urinary Symptoms: Leaks urine with exercise, going from sitting to standing, with a full bladder, and with movement to the bathroom Leaks 4-6 time(s) per day. Has more leakage with movement during the day.  Pad use: 4-6 pads per day.   She is bothered by her UI symptoms. Has had urodynamic testing at [facility].  Currently on Gemtesa for < 1 year and is helping her symptoms.  Has also done pelvic physical therapy. Noticed more leakage with squats.   Day time voids 6-8.  Nocturia: 1 times per night to void. Voiding dysfunction: she empties her bladder well.  does not use a catheter to empty bladder.  When urinating, she feels dribbling after finishing  UTIs:  0  UTI's in the last year.   Denies history of blood in urine and kidney or bladder stones  Pelvic Organ Prolapse Symptoms:                  She Denies a feeling of a bulge the vaginal area.  Has a #3 RWS pessary in place- a few years. Did not have any urinary leakage before the pessary.  Before the pessary, needed to self-catheterize.   Bowel Symptom: Bowel movements: 1 time(s) per day Stool consistency: soft  Straining: no.  Splinting: no.  Incomplete evacuation: no.  She Denies accidental bowel leakage / fecal incontinence Bowel regimen: none Last colonoscopy: Date- 2021, Results-  negative  Sexual Function Sexually active: no.  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   Urinary tract infection    Wears hearing aid      Past Surgical History:   Past Surgical History:  Procedure Laterality Date   ABDOMINAL EXPOSURE N/A 05/17/2019   Procedure: ABDOMINAL EXPOSURE;  Surgeon: Angelia Mould, MD;  Location: [place];  Service: Vascular;  Laterality: N/A;   ABDOMINAL HYSTERECTOMY  1973   1/2 of ovary left cyst on ovary   ANTERIOR LUMBAR FUSION N/A 05/17/2019   Procedure: Lumbar five Sacral one  Anterior lumbar interbody fusion;  Surgeon: Kristeen Miss, MD;  Location: [place];  Service: Neurosurgery;  Laterality: N/A;   APPENDECTOMY     removed with hysterectomy   COLONOSCOPY     LUMBAR WOUND DEBRIDEMENT  12/06/2011   Procedure: LUMBAR WOUND  DEBRIDEMENT;  Surgeon: Kristeen Miss, MD;  Location: [place] [facility];  Service: Neurosurgery;  Laterality: N/A;  Repair of Pseudomeningocele Lumbar wound   ROTATOR CUFF REPAIR     left    shoulder rotator culff  Left 2006   spinal cord tumor  1987   2012 october      Past OB/GYN History: OB History  Gravida Para Term Preterm AB Living  '1 1 1     1  '$ SAB IAB Ectopic Multiple Live Births          1    # Outcome Date GA Lbr Len/2nd Weight Sex Delivery Anes PTL Lv  1 Term      Vag-Spont      S/p hysterectomy   Medications: She has a current medication list which includes the following prescription(s): acetylcysteine, calcium, cyanocobalamin,  garlic, lamotrigine, magnesium, multiple vitamins-minerals, NONFORMULARY OR COMPOUNDED ITEM, pyridoxine, tramadol, gemtesa, and vitamin d-vitamin k.   Allergies: Patient is allergic to ibuprofen-famotidine, fentanyl, pregabalin, and penicillins.   Social History:  Social History   Tobacco Use   Smoking status: Former    Packs/day: 0.25    Years: 2.00    Pack years: 0.50    Types: Cigarettes    Quit date: 09/03/1967    Years since quitting: 54.2   Smokeless tobacco: Never   Tobacco comments:    quit 48 years   Vaping Use   Vaping Use: Never used  Substance Use Topics   Alcohol use: Yes    Alcohol/week: 4.0 standard drinks    Types: 4 Glasses of wine per week    Comment: 4 a week   Drug use: No    Relationship status: widowed She lives alone.   She is employed as a Optometrist. Regular exercise: Yes: walking, pilates History of abuse: no  Family History:   Family History  Problem Relation Age of Onset   Stroke Mother        2010   Heart attack Father    Cancer Paternal Grandmother      Review of Systems: Review of Systems  Constitutional:  Negative for fever, malaise/fatigue and weight loss.  Respiratory:  Negative for cough, shortness of breath and wheezing.   Cardiovascular:  Negative for chest pain, palpitations and leg swelling.  Gastrointestinal:  Negative for abdominal pain and blood in stool.  Genitourinary:  Negative for dysuria.  Musculoskeletal:  Negative for myalgias.  Skin:  Negative for rash.  Neurological:  Negative for dizziness and headaches.  Endo/Heme/Allergies:  Bruises/bleeds easily.  Psychiatric/Behavioral:  Negative for depression. The patient is not nervous/anxious.     OBJECTIVE Physical Exam: Vitals:   11/12/21 1100  BP: 120/77  Pulse: 83  Weight: 102 lb (46.3 kg)  Height: 5' (1.524 m)    Physical Exam Constitutional:      General: She is not in acute distress. Pulmonary:     Effort: Pulmonary effort is normal.  Abdominal:      General: There is no distension.     Palpations: Abdomen is soft.     Tenderness: There is no abdominal tenderness. There is no rebound.  Musculoskeletal:        General: No swelling. Normal range of motion.  Skin:    General: Skin is warm and dry.     Findings: No rash.  Neurological:     Mental Status: She is alert and oriented to person, place, and time.  Psychiatric:        Mood and Affect:  Mood normal.        Behavior: Behavior normal.     GU / Detailed Urogynecologic Evaluation:  Pelvic Exam: Normal external female genitalia; Bartholin's and Skene's glands normal in appearance; urethral meatus normal in appearance, no urethral masses or discharge.   CST: negative  s/p hysterectomy: Speculum exam reveals normal vaginal mucosa with  atrophy and normal vaginal cuff.  Adnexa no mass, fullness, tenderness.     Pelvic floor strength I/V  Pelvic floor musculature: Right levator non-tender, Right obturator non-tender, Left levator non-tender, Left obturator non-tender  POP-Q:   POP-Q  -1                                            Aa   -1                                           Ba  -8                                              C   2.5                                            Gh  4                                            Pb  9                                            tvl   -2                                            Ap  -2                                            Bp                                                 D   Pessary fitting: A 2-1/2 in incontinence dish was placed. It was comfortable, fit well, and stayed in placed with strong cough, valsalva and bending. Lot # [id number] 05/18/26   Rectal Exam:  Normal external rectum  Post-Void Residual (PVR) by Bladder Scan: In order to evaluate bladder emptying, we discussed obtaining a postvoid residual and she agreed to this procedure.  Procedure: The ultrasound unit was placed on the patient's  abdomen in the suprapubic  region after the patient had voided. A PVR of 1 ml was obtained by bladder scan.  Laboratory Results: POC urine: negative   ASSESSMENT AND PLAN Ms. Burling is a 81 y.o. with:  1. Prolapse of anterior vaginal wall   2. SUI (stress urinary incontinence, female)   3. Overactive bladder   4. Urinary frequency     Stage II anterior, Stage I posterior, Stage I apical prolapse - For treatment of pelvic organ prolapse, we discussed options for management including expectant management, conservative management, and surgical management, such as Kegels, a pessary, pelvic floor physical therapy, and specific surgical procedures. - currently has a pessary in place  2. SUI - For treatment of stress urinary incontinence,  non-surgical options include expectant management, weight loss, physical therapy, as well as a pessary.  Surgical options include a midurethral sling, and transurethral injection of a bulking agent. - 2-1/2in incontinence dish pessary was placed today   3. OAB - currently on Gemtesa '75mg'$  daily   - Patient has had previous urodynamic testing. Requested records from [facility] urology which showed: First sensation 183m, capacity at 6759m No SUI noted during the study and no detrusor instability noted. Unable to void and no detrusor contraction was detected.  - At the time of the study, patient had been doing CISC for incomplete bladder emptying but now empties better with pessary in place. May need to repeat study if pursuing invasive procedures.  - Will follow up in 1 month to reassess symptoms with new pessary  MiJaquita FoldsMD

## 2021-11-27 ENCOUNTER — Telehealth: Payer: Self-pay | Admitting: Obstetrics and Gynecology

## 2021-11-27 NOTE — Telephone Encounter (Signed)
Spoke to the patient she will drop off a urine sample at 10:30am ?

## 2021-11-28 ENCOUNTER — Ambulatory Visit (INDEPENDENT_AMBULATORY_CARE_PROVIDER_SITE_OTHER): Payer: Medicare PPO

## 2021-11-28 ENCOUNTER — Other Ambulatory Visit (HOSPITAL_COMMUNITY)
Admission: RE | Admit: 2021-11-28 | Discharge: 2021-11-28 | Disposition: A | Discharge status: Home / Self Care | Payer: Medicare PPO | Source: Ambulatory Visit | Attending: Obstetrics and Gynecology | Admitting: Obstetrics and Gynecology

## 2021-11-28 ENCOUNTER — Other Ambulatory Visit: Payer: Self-pay

## 2021-11-28 ENCOUNTER — Encounter: Payer: Self-pay | Admitting: Obstetrics and Gynecology

## 2021-11-28 DIAGNOSIS — R82998 Other abnormal findings in urine: Secondary | ICD-10-CM

## 2021-11-28 DIAGNOSIS — R350 Frequency of micturition: Secondary | ICD-10-CM

## 2021-11-28 LAB — POCT URINALYSIS DIPSTICK
Appearance: ABNORMAL
Bilirubin, UA: NEGATIVE
Blood, UA: NEGATIVE
Glucose, UA: NEGATIVE
Ketones, UA: NEGATIVE
Nitrite, UA: POSITIVE
Protein, UA: NEGATIVE
Spec Grav, UA: 1.02 (ref 1.010–1.025)
Urobilinogen, UA: 0.2 E.U./dL
pH, UA: 6 (ref 5.0–8.0)

## 2021-11-28 MED ORDER — SULFAMETHOXAZOLE-TRIMETHOPRIM 800-160 MG PO TABS: 1.0000 | ORAL_TABLET | Freq: Two times a day (BID) | ORAL | 0 refills | Status: DC

## 2021-11-28 NOTE — Telephone Encounter (Signed)
Pt was contacted. See documentation  ?

## 2021-11-28 NOTE — Patient Instructions (Signed)
Your Urine dip that was done in office was POSITIVE.  ?I am sending the urine off for culture. We have also prescribed Bactrim to the pharmacy for you to pick up. ?We will contact you when the results are back between 3-5 days. ?If you have any questions or concerns please feel free to call us at [phone]  ?

## 2021-11-28 NOTE — Progress Notes (Signed)
Kaitlyn Flowers is a 81 y.o. female came into the office to give a urine sample ?because shei s having  UTI sx of burning pain while urinating. ? ?[facility].  ? ?Bactrim was sent to the pharmacy. ? ?

## 2021-11-30 LAB — URINE CULTURE: Culture: >=100000 — AB

## 2021-12-03 ENCOUNTER — Telehealth: Payer: Self-pay

## 2021-12-03 MED ORDER — SULFAMETHOXAZOLE-TRIMETHOPRIM 800-160 MG PO TABS
1.0000 | ORAL_TABLET | Freq: Two times a day (BID) | ORAL | 0 refills | Status: DC
Start: 2021-12-03 — End: 2022-03-22

## 2021-12-03 NOTE — Telephone Encounter (Signed)
Called the pt 2x without success. LM on the VM for the patient to call me back ?

## 2021-12-03 NOTE — Telephone Encounter (Signed)
A second round of Bactrim has been sent to the pharmacy.  ?LM on the VM for the pt to call me back ?

## 2021-12-03 NOTE — Telephone Encounter (Signed)
Kaitlyn Flowers is a 81 y.o. female called in because she is still having UTI sx. Pt said she took the bactrim prescribed last week and she still has the same sx.  ?Pt was scheduled for f/u appt on 12/04/2021. ?

## 2021-12-04 ENCOUNTER — Ambulatory Visit: Payer: Medicare PPO | Admitting: Obstetrics and Gynecology

## 2021-12-04 ENCOUNTER — Ambulatory Visit (INDEPENDENT_AMBULATORY_CARE_PROVIDER_SITE_OTHER): Payer: Medicare PPO | Admitting: Obstetrics and Gynecology

## 2021-12-04 ENCOUNTER — Encounter: Payer: Self-pay | Admitting: Obstetrics and Gynecology

## 2021-12-04 VITALS — BP 135/69 | HR 87

## 2021-12-04 DIAGNOSIS — N393 Stress incontinence (female) (male): Secondary | ICD-10-CM | POA: Diagnosis not present

## 2021-12-04 DIAGNOSIS — N8110 Cystocele, unspecified: Secondary | ICD-10-CM | POA: Diagnosis not present

## 2021-12-04 DIAGNOSIS — R339 Retention of urine, unspecified: Secondary | ICD-10-CM | POA: Diagnosis not present

## 2021-12-04 DIAGNOSIS — N3000 Acute cystitis without hematuria: Secondary | ICD-10-CM | POA: Diagnosis not present

## 2021-12-04 MED ORDER — NITROFURANTOIN MONOHYD MACRO 100 MG PO CAPS: 100.0000 mg | ORAL_CAPSULE | Freq: Two times a day (BID) | ORAL | 0 refills | Status: AC

## 2021-12-04 NOTE — Patient Instructions (Signed)

## 2021-12-04 NOTE — Progress Notes (Signed)
[facility] ?Return Visit ? ?SUBJECTIVE  ?History of Present Illness: ?Kaitlyn Flowers is a 81 y.o. female seen in follow-up after pessary fitting (2-1/2 in incontinence dish). She feels that she is not emptying her bladder well since having the pessary placed. In the beginning she did not have an issue but then over time is having  to go more often and feeling more uncomfortable. However, the pessary has fixed her leakage.  Currently being treated for urinary tract infection- was on bactrim for several days but still felt like she had symptoms so this was extended for another week. She reports that she has nausea with the medication and is requesting an alternative.  ? ?Has a history of incomplete bladder emptying requiring CISC.  ? ?Past Medical History: ?Patient  has a past medical history of Anemia, Arthritis, Back pain, Cancer ([place]), Depression, Leg weakness, Osteopenia, PONV (postoperative nausea and vomiting), Right leg pain, Seizures ([place]), Spinal cord tumor, Urinary tract infection, and Wears hearing aid.  ? ?Past Surgical History: ?She  has a past surgical history that includes Abdominal hysterectomy (1973); shoulder rotator culff  (Left, 2006); spinal cord tumor (1987); Appendectomy; Rotator cuff repair; Lumbar wound debridement (12/06/2011); Colonoscopy; Anterior lumbar fusion (N/A, 05/17/2019); and Abdominal exposure (N/A, 05/17/2019).  ? ?Medications: ?She has a current medication list which includes the following prescription(s): acetylcysteine, calcium, cyanocobalamin, garlic, lamotrigine, magnesium, multiple vitamins-minerals, nitrofurantoin (macrocrystal-monohydrate), NONFORMULARY OR COMPOUNDED ITEM, pyridoxine, sulfamethoxazole-trimethoprim, tramadol, gemtesa, and vitamin d-vitamin k.  ? ?Allergies: ?Patient is allergic to ibuprofen-famotidine, fentanyl, pregabalin, and penicillins.  ? ?Social History: ?Patient  reports that she quit smoking about 54 years ago. Her smoking use included  cigarettes. She has a 0.50 pack-year smoking history. She has never used smokeless tobacco. She reports current alcohol use of about 4.0 standard drinks per week. She reports that she does not use drugs.  ?  ?  ?OBJECTIVE  ?  ? ?Physical Exam: ?Vitals:  ? 12/04/21 1523  ?BP: 135/69  ?Pulse: 87  ? ?Gen: No apparent distress, A&O x 3. ? ?Detailed Urogynecologic Evaluation:  ?Normal external genitalia. Pessary was noted to be in place. It was removed. Speculum exam revealed normal mucosa with no lesions. She was fit with a 2-1/4 in incontinence ring pessary. It was comfortable, fit well, and stayed in placed with strong cough, valsalva and bending. Lot # [id number] Exp 05/25/25 ? ?  ?PVR- 126m  ?ASSESSMENT AND PLAN  ?  ?Kaitlyn Flowers a 81y.o. with:  ?1. Acute cystitis without hematuria   ?2. Prolapse of anterior vaginal wall   ?3. SUI (stress urinary incontinence, female)   ?4. Incomplete bladder emptying   ? ?Cystitis ?- Will change bactrim to nitrofurantoin '100mg'$  BID x 5days ? ?2. POP ?- fit for a new pessary today- 2-1/4in incontinence ring ?- We reviewed options other than a pessary and she would like to pursue surgery. We discussed vaginal repair- anterior repair with sacrospinous ligament fixation.  ? ?3. SUI/ Incomplete bladder emptying ?- We reviewed the results of her prior urodynamic study including no SUI with incomplete bladder emptying. At that time, she was performing CISC and she has not needed to do that in some time, especially since the pessary has been in place.  ?- She is having some mild symptoms of incomplete emptying today, but may be either from pessary or urinary tract infection. Downsized the pessary and she is being treated for the infection.  ?- Will have her return for urodynamic testing to better  assess as she would like the leakage fixed a the time of prolapse repair.  ? ?Return for urodynamics ? ?Jaquita Folds, MD ? ?Time spent: I spent 25 minutes dedicated to the care of this  patient on the date of this encounter to include pre-visit review of records, face-to-face time with the patient  and post visit documentation. Additional time was spent on the pessary fitting.  ? ?

## 2021-12-20 ENCOUNTER — Ambulatory Visit (HOSPITAL_BASED_OUTPATIENT_CLINIC_OR_DEPARTMENT_OTHER): Payer: Medicare PPO | Admitting: Obstetrics & Gynecology

## 2021-12-21 ENCOUNTER — Ambulatory Visit: Payer: Medicare PPO | Admitting: Obstetrics and Gynecology

## 2021-12-21 ENCOUNTER — Ambulatory Visit (HOSPITAL_BASED_OUTPATIENT_CLINIC_OR_DEPARTMENT_OTHER): Payer: Medicare PPO | Admitting: Obstetrics & Gynecology

## 2021-12-28 DIAGNOSIS — M79661 Pain in right lower leg: Secondary | ICD-10-CM | POA: Diagnosis not present

## 2022-01-04 ENCOUNTER — Encounter: Payer: Medicare PPO | Admitting: Obstetrics and Gynecology

## 2022-01-06 NOTE — Progress Notes (Deleted)
No chief complaint on file.   HPI: Kaitlyn Flowers 81 y.o. come in for Last visit by me 7 21  for abd pain ROS: See pertinent positives and negatives per HPI.  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   Urinary tract infection    Wears hearing aid     Family History  Problem Relation Age of Onset   Stroke Mother        2010   Heart attack Father    Cancer Paternal Grandmother     Social History   Socioeconomic History   Marital status: Widowed    Spouse name: Not on file   Number of children: Not on file   Years of education: Not on file   Highest education level: Not on file  Occupational History   Not on file  Tobacco Use   Smoking status: Former    Packs/day: 0.25    Years: 2.00    Pack years: 0.50    Types: Cigarettes    Quit date: 09/03/1967    Years since quitting: 54.3   Smokeless tobacco: Never   Tobacco comments:    quit 48 years   Vaping Use   Vaping Use: Never used  Substance and Sexual Activity   Alcohol use: Yes    Alcohol/week: 4.0 standard drinks    Types: 4 Glasses of wine per week    Comment: 4 a week   Drug use: No   Sexual activity: Not Currently    Partners: Male    Birth control/protection: Surgical    Comment: hysterectomy  Other Topics Concern   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     CALCIUM PO Take 2.5 mLs by mouth daily. Liquid calcium     cyanocobalamin 2000 MCG tablet Take 2,000 mcg by mouth daily.     GARLIC PO Take 7,893 mg by mouth daily.     lamoTRIgine (LAMICTAL) 25 MG tablet TAKE 1 TABLET(25 MG) BY MOUTH EVERY EVENING 90 tablet 3   MAGNESIUM PO Take 1 tablet by mouth daily.     Multiple Vitamins-Minerals (ZINC PO) Take 2.5 mg by mouth daily.     NONFORMULARY OR COMPOUNDED ITEM Vitamin E vaginal cream 200u/ml.  One ml pv twice weekly.  Disp. 3 month supply. 24 each 3   pyridoxine (B-6) 200 MG tablet Take 200 mg by mouth daily.  sulfamethoxazole-trimethoprim (BACTRIM DS) 800-160 MG tablet Take 1 tablet by mouth 2 (two) times daily. 6 tablet 0   traMADol (ULTRAM) 50 MG tablet Take 50 mg by mouth daily.     Vibegron (GEMTESA) 75 MG TABS Take 75 mg by mouth every evening.      Vitamin D-Vitamin K (VITAMIN K2-VITAMIN D3 PO) Take 1 capsule by mouth daily.     No facility-administered medications prior to visit.     EXAM:  LMP  (LMP Unknown)   There is no height or weight on file to calculate BMI.  GENERAL: vitals reviewed and listed above, alert, oriented, appears well hydrated and in no acute distress HEENT: atraumatic, conjunctiva  clear, no obvious abnormalities on inspection of external nose and ears OP : no lesion edema or exudate  NECK: no obvious masses on inspection palpation  LUNGS: clear to auscultation bilaterally, no wheezes, rales or rhonchi, good air movement CV: HRRR, no clubbing cyanosis or  peripheral edema nl cap refill  MS: moves all extremities without noticeable focal  abnormality PSYCH: pleasant and cooperative, no obvious depression or anxiety Lab Results   Component Value Date   WBC 6.5 03/30/2020   HGB 10.9 (L) 03/30/2020   HCT 32.5 (L) 03/30/2020   PLT 255.0 03/30/2020   GLUCOSE 90 05/03/2021   CHOL 241 (H) 04/02/2016   TRIG 78.0 04/02/2016   HDL 111.30 04/02/2016   LDLDIRECT 110.1 11/27/2010   LDLCALC 114 (H) 04/02/2016   ALT 18 03/30/2020   AST 22 03/30/2020   NA 138 05/03/2021   K 4.9 05/03/2021   CL 102 05/03/2021   CREATININE 0.83 05/03/2021   BUN 27 (H) 05/03/2021   CO2 30 05/03/2021   TSH 0.83 09/26/2017   INR 1.01 03/25/2016   BP Readings from Last 3 Encounters:  12/04/21 135/69  11/12/21 120/77  09/20/21 125/62    ASSESSMENT AND PLAN:  Discussed the following assessment and plan:  No diagnosis found.  -Patient advised to return or notify health care team  if  new concerns arise.  There are no Patient Instructions on file for this visit.   Standley Brooking. Zebbie Ace M.D.

## 2022-01-07 ENCOUNTER — Ambulatory Visit: Payer: Medicare PPO | Admitting: Internal Medicine

## 2022-01-07 DIAGNOSIS — M25551 Pain in right hip: Secondary | ICD-10-CM | POA: Diagnosis not present

## 2022-01-07 DIAGNOSIS — M545 Low back pain, unspecified: Secondary | ICD-10-CM | POA: Diagnosis not present

## 2022-01-11 DIAGNOSIS — M5416 Radiculopathy, lumbar region: Secondary | ICD-10-CM | POA: Diagnosis not present

## 2022-01-11 DIAGNOSIS — M4135 Thoracogenic scoliosis, thoracolumbar region: Secondary | ICD-10-CM | POA: Diagnosis not present

## 2022-01-11 DIAGNOSIS — M21371 Foot drop, right foot: Secondary | ICD-10-CM | POA: Diagnosis not present

## 2022-01-17 ENCOUNTER — Ambulatory Visit: Payer: Medicare PPO | Admitting: Obstetrics and Gynecology

## 2022-01-30 DIAGNOSIS — M4180 Other forms of scoliosis, site unspecified: Secondary | ICD-10-CM | POA: Diagnosis not present

## 2022-01-30 DIAGNOSIS — Z681 Body mass index (BMI) 19 or less, adult: Secondary | ICD-10-CM | POA: Diagnosis not present

## 2022-01-31 ENCOUNTER — Ambulatory Visit (INDEPENDENT_AMBULATORY_CARE_PROVIDER_SITE_OTHER): Payer: Medicare PPO | Admitting: Obstetrics and Gynecology

## 2022-01-31 ENCOUNTER — Encounter: Payer: Self-pay | Admitting: Obstetrics and Gynecology

## 2022-01-31 VITALS — BP 105/72 | HR 82

## 2022-01-31 DIAGNOSIS — N393 Stress incontinence (female) (male): Secondary | ICD-10-CM

## 2022-01-31 DIAGNOSIS — F1120 Opioid dependence, uncomplicated: Secondary | ICD-10-CM | POA: Diagnosis not present

## 2022-01-31 DIAGNOSIS — R3915 Urgency of urination: Secondary | ICD-10-CM | POA: Diagnosis not present

## 2022-01-31 DIAGNOSIS — Z681 Body mass index (BMI) 19 or less, adult: Secondary | ICD-10-CM | POA: Diagnosis not present

## 2022-01-31 DIAGNOSIS — M4180 Other forms of scoliosis, site unspecified: Secondary | ICD-10-CM | POA: Diagnosis not present

## 2022-01-31 LAB — POCT URINALYSIS DIPSTICK
Bilirubin, UA: NEGATIVE
Glucose, UA: NEGATIVE
Ketones, UA: NEGATIVE
Leukocytes, UA: NEGATIVE
Nitrite, UA: NEGATIVE
Protein, UA: NEGATIVE
Spec Grav, UA: 1.02 (ref 1.010–1.025)
Urobilinogen, UA: 0.2 E.U./dL
pH, UA: 5.5 (ref 5.0–8.0)

## 2022-01-31 NOTE — Progress Notes (Signed)
Kaitlyn Flowers  Referring Physician: Burnis Medin, MD Date of Flowers: 01/31/2022  Kaitlyn Flowers is a 81 y.o. female who presents for urodynamic evaluation. Indication(s) for study: SUI and incomplete bladder emptying (history of CISC)  Vital Signs: BP 105/72 (BP Location: Right Arm, Patient Position: Sitting, Cuff Size: Normal)   Pulse 82   LMP  (LMP Unknown)   SpO2 97%   Laboratory Results: A catheterized urine specimen revealed:  POC urine: negative   Voiding Diary: Not performed  Flowers Timeout:  The correct patient was verified and the correct Flowers was verified. The patient was in the correct position and safety precautions were reviewed based on at the patient's history.  Urodynamic Flowers A 60F dual lumen urodynamics catheter was placed under sterile conditions into the patient's bladder. A 60F catheter was placed into the rectum in order to measure abdominal pressure. EMG patches were placed in the appropriate position.  All connections were confirmed and calibrations/adjusted made. Saline was instilled into the bladder through the dual lumen catheters.  Cough/valsalva pressures were measured periodically during filling.  Patient was allowed to void.  The bladder was then emptied of its residual.  UROFLOW: Revealed a Qmax of 3.3 mL/sec.  She voided 21.5 mL and had a residual of 65 mL.  It was a normal pattern and represented normal habits.  CMG: This was performed with sterile water in the sitting position at a fill rate of 30 mL/min.    First sensation of fullness was 106 mLs,  First urge was 169 mLs,  Strong urge was 314 mLs and  Capacity was 564 mLs  Stress incontinence was demonstrated Lowest positive Barrier CLPP was 39 cmH20 at 200 ml. Highest negative Barrier VLPP was 87 cmH20 at 200 ml.  Detrusor function was normal, with no phasic contractions seen.    Compliance:  normal. End fill detrusor pressure was  -13cmH20.    UPP: MUCP with barrier reduction was 23 cm of water.    MICTURITION STUDY: Voiding was performed with reduction using scopettes in the sitting position.  Pdet at Qmax was 5.4 cm of water.  Qmax was 34.5 mL/sec.  It was a intermittent pattern.  She voided 563 mL and had a residual of 0 mL.  It was a volitional void, sustained detrusor contraction was not present- she had low amplitude and interrupted detrusor contractions. Abdominal straining was present with voiding.   EMG: This was performed with patches.  She had voluntary contractions, recruitment with fill was not present and urethral sphincter was relaxed with void.  The details of the Flowers with the study tracings have been scanned into EPIC.   Urodynamic Impression:  1. Sensation was normal; capacity was normal 2. Stress Incontinence was demonstrated at ISD range pressures; 3. Detrusor Overactivity was not demonstrated. 4. Emptying was dysfunctional with a normal PVR, a sustained detrusor contraction not present (low amplitude, intermittent contraction),  abdominal straining present, normal urethral sphincter activity on EMG.  Plan: - The patient will follow up  to discuss the findings and treatment options.

## 2022-01-31 NOTE — Patient Instructions (Signed)
Taking Care of Yourself after Urodynamics, Cystoscopy, Coaptite Injection, or Botox Injection   Drink plenty of water for a day or two following your procedure. Try to have about 8 ounces (one cup) at a time, and do this 6 times or more per day unless you have fluid restrictitons AVOID irritative beverages such as coffee, tea, soda, alcoholic or citrus drinks for a day or two, as this may cause burning with urination.  For the first 1-2 days after the procedure, your urine may be pink or red in color. You may have some blood in your urine as a normal side effect of the procedure. Large amounts of bleeding or difficulty urinating are NOT normal. Call the nurse line if this happens or go to the nearest Emergency Room if the bleeding is heavy or you cannot urinate at all and it is after hours. You may experience some discomfort or a burning sensation with urination after having this procedure. You can use over the counter Azo or pyridium to help with burning and follow the instructions on the packaging. If it does not improve within 1-2 days, or other symptoms appear (fever, chills, or difficulty urinating) call the office to speak to a nurse.  You may return to normal daily activities such as work, school, driving, exercising and housework on the day of the procedure.  

## 2022-02-08 ENCOUNTER — Encounter: Payer: Self-pay | Admitting: Obstetrics and Gynecology

## 2022-02-08 ENCOUNTER — Ambulatory Visit: Payer: Medicare PPO | Admitting: Obstetrics and Gynecology

## 2022-02-08 VITALS — BP 93/56 | HR 82

## 2022-02-08 DIAGNOSIS — N8110 Cystocele, unspecified: Secondary | ICD-10-CM

## 2022-02-08 DIAGNOSIS — N393 Stress incontinence (female) (male): Secondary | ICD-10-CM | POA: Diagnosis not present

## 2022-02-08 NOTE — Progress Notes (Unsigned)
[facility] Return Visit  SUBJECTIVE  History of Present Illness: Kaitlyn Flowers is a 81 y.o. female seen in follow-up after urodynamic testing. She was considering surgery for her prolapse and incontinence.   She has a 2-1/4 in incontinence dish currently and this has been working well for her. Overall she is happy with the pessary.   Urodynamic Impression:  1. Sensation was normal; capacity was normal 2. Stress Incontinence was demonstrated at ISD range pressures; 3. Detrusor Overactivity was not demonstrated. 4. Emptying was dysfunctional with a normal PVR, a sustained detrusor contraction not present (low amplitude, intermittent contraction),  abdominal straining present, normal urethral sphincter activity on EMG.  Past Medical History: Patient  has a past medical history of Anemia, Arthritis, Back pain, Cancer ([place]), Depression, Leg weakness, Osteopenia, PONV (postoperative nausea and vomiting), Right leg pain, Seizures ([place]), Spinal cord tumor, Urinary tract infection, and Wears hearing aid.   Past Surgical History: She  has a past surgical history that includes Abdominal hysterectomy (1973); shoulder rotator culff  (Left, 2006); spinal cord tumor (1987); Appendectomy; Rotator cuff repair; Lumbar wound debridement (12/06/2011); Colonoscopy; Anterior lumbar fusion (N/A, 05/17/2019); and Abdominal exposure (N/A, 05/17/2019).   Medications: She has a current medication list which includes the following prescription(s): acetylcysteine, calcium, cyanocobalamin, garlic, lamotrigine, magnesium, multiple vitamins-minerals, NONFORMULARY OR COMPOUNDED ITEM, pyridoxine, sulfamethoxazole-trimethoprim, tramadol, gemtesa, and vitamin d-vitamin k.   Allergies: Patient is allergic to ibuprofen-famotidine, fentanyl, pregabalin, and penicillins.   Social History: Patient  reports that she quit smoking about 54 years ago. Her smoking use included cigarettes. She has a 0.50 pack-year  smoking history. She has never used smokeless tobacco. She reports current alcohol use of about 4.0 standard drinks of alcohol per week. She reports that she does not use drugs.      OBJECTIVE     Physical Exam: Vitals:   02/08/22 1524  BP: (!) 93/56  Pulse: 82   Gen: No apparent distress, A&O x 3.  Detailed Urogynecologic Evaluation:  Deferred. Prior exam showed:  POP-Q:    POP-Q   -1                                            Aa   -1                                           Ba   -8                                              C    2.5                                            Gh   4                                            Pb   9  tvl    -2                                            Ap   -2                                            Bp                                                  D      ASSESSMENT AND PLAN    Kaitlyn Flowers is a 81 y.o. with:  1. SUI (stress urinary incontinence, female)   2. Prolapse of anterior vaginal wall     - We reviewed the results of the urodynamic testing and that due to her valsalva voiding, she would not be a good candidate for a sling. Reviewed the option of urethral bulking, with or without prolapse repair.  - Overall she is happy with the pessary and would like to continue it at this time. Will have her return for cleanings q 3 months.    Jaquita Folds, MD  Time spent: I spent 20 minutes dedicated to the care of this patient on the date of this encounter to include pre-visit review of records, face-to-face time with the patient and post visit documentation.

## 2022-02-11 ENCOUNTER — Encounter: Payer: Self-pay | Admitting: Obstetrics and Gynecology

## 2022-03-02 NOTE — Telephone Encounter (Signed)
Prolia VOB initiated via [email or web address]  Last Prolia inj 10/10/21 Next Prolia inj 04/10/22  Prior Auth: APPROVED PA# [id number] Valid: 11/12/19-09/01/22

## 2022-03-06 DIAGNOSIS — M545 Low back pain, unspecified: Secondary | ICD-10-CM | POA: Diagnosis not present

## 2022-03-06 DIAGNOSIS — M4180 Other forms of scoliosis, site unspecified: Secondary | ICD-10-CM | POA: Diagnosis not present

## 2022-03-14 DIAGNOSIS — M4180 Other forms of scoliosis, site unspecified: Secondary | ICD-10-CM | POA: Diagnosis not present

## 2022-03-14 DIAGNOSIS — M545 Low back pain, unspecified: Secondary | ICD-10-CM | POA: Diagnosis not present

## 2022-03-16 NOTE — Telephone Encounter (Signed)
Pt ready for scheduling on or after 04/10/22  Out-of-pocket cost due at time of visit: $40  Primary: [employer] - Medicare Adv Prolia co-insurance: 0% Admin fee co-insurance: $40  Secondary: n/a Prolia co-insurance:  Admin fee co-insurance:   Deductible: does not apply  Prior Auth: APPROVED PA# [id number] Valid: 11/12/19-09/01/22    ** This summary of benefits is an estimation of the patient's out-of-pocket cost. Exact cost may very based on individual plan coverage.

## 2022-03-18 ENCOUNTER — Telehealth: Payer: Medicare PPO | Admitting: Internal Medicine

## 2022-03-18 DIAGNOSIS — M5440 Lumbago with sciatica, unspecified side: Secondary | ICD-10-CM

## 2022-03-18 DIAGNOSIS — E785 Hyperlipidemia, unspecified: Secondary | ICD-10-CM

## 2022-03-18 DIAGNOSIS — R29898 Other symptoms and signs involving the musculoskeletal system: Secondary | ICD-10-CM | POA: Diagnosis not present

## 2022-03-18 DIAGNOSIS — R5383 Other fatigue: Secondary | ICD-10-CM | POA: Diagnosis not present

## 2022-03-18 DIAGNOSIS — G8929 Other chronic pain: Secondary | ICD-10-CM

## 2022-03-18 DIAGNOSIS — Z79899 Other long term (current) drug therapy: Secondary | ICD-10-CM

## 2022-03-18 DIAGNOSIS — R110 Nausea: Secondary | ICD-10-CM

## 2022-03-18 DIAGNOSIS — M21371 Foot drop, right foot: Secondary | ICD-10-CM | POA: Diagnosis not present

## 2022-03-18 NOTE — Progress Notes (Signed)
Virtual Visit via Video Note  I connected with Kaitlyn Flowers on 03/18/22 at 10:30 AM EDT by a video enabled telemedicine application and verified that I am speaking with the correct person using two identifiers. Location patient: home Location provider:work office Persons participating in the virtual visit: patient, provider  [place]th national recommendations  regarding COVID 19 pandemic   video visit is advised over in office visit for this patient.  Patient aware  of the limitations of evaluation and management by telemedicine and  availability of in person appointments. and agreed to proceed.   HPI: Kaitlyn Flowers presents for video visit to review a couple of problems. s tired in the middle of the day in the afternoon most recently she sleeps well feels rested when she gets up but then will feel tired and lay down to take a nap either at 1130 or afternoon sets the alarm for 20 to 30 minutes.  No real chest pain shortness of breath with this sleeps through the night but does have up for bathroom 1 time. Difficulty with nausea trying to increase her nutrition weight but his protein power makes her nausea as well or if the protein bar increasing dairy also a problem but can eat cheese or small amounts of other dairy.  It is a vegetarian. Pain management still has foot drop and chronic pain taking tramadol once or twice a day sees pain management at Dr. Clarice Pole office.  Had side effects with some other medicines so was trying Cymbalta she believes 20 mg took it once had some nausea was unable to get answers about side effects or if the symptoms would resolve.   Last visit was 5 2021   Care via [facility]  Neuro: Taking Lamictal going well to continue. Pain clinic :[name]   Weight apparently is stable. ROS: See pertinent positives and negatives per HPI.  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   Urinary tract infection    Wears hearing aid     Past Surgical History:  Procedure Laterality Date   ABDOMINAL EXPOSURE N/A 05/17/2019   Procedure: ABDOMINAL EXPOSURE;  Surgeon: Angelia Mould, MD;  Location: [facility];  Service: Vascular;  Laterality: N/A;   ABDOMINAL HYSTERECTOMY  1973   1/2 of ovary left cyst on ovary   ANTERIOR LUMBAR FUSION N/A 05/17/2019   Procedure: Lumbar five Sacral one  Anterior lumbar interbody fusion;  Surgeon: Kristeen Miss, MD;  Location: [facility];  Service: Neurosurgery;  Laterality: N/A;   APPENDECTOMY     removed with hysterectomy   COLONOSCOPY     LUMBAR WOUND DEBRIDEMENT  12/06/2011   Procedure: LUMBAR WOUND DEBRIDEMENT;  Surgeon: Kristeen Miss, MD;  Location: [facility];  Service: Neurosurgery;  Laterality: N/A;  Repair of Pseudomeningocele Lumbar wound   ROTATOR CUFF REPAIR     left    shoulder rotator culff  Left 2006   spinal cord tumor  1987   2012 october  Family History  Problem Relation Age of Onset   Stroke Mother        2010   Heart attack Father    Cancer Paternal Grandmother     Social History   Tobacco Use   Smoking status: Former    Packs/day: 0.25    Years: 2.00    Total pack years: 0.50    Types: Cigarettes    Quit date: 09/03/1967    Years since quitting: 54.5   Smokeless tobacco: Never   Tobacco comments:    quit 48 years   Vaping Use   Vaping Use: Never used  Substance Use Topics   Alcohol use: Yes    Alcohol/week: 4.0 standard drinks of alcohol    Types: 4 Glasses of wine per week    Comment: 4 a week   Drug use: No      Current Outpatient Medications:    Acetylcysteine (N-ACETYL-L-CYSTEINE  PO), Take 500 mg by mouth daily., Disp: , Rfl:    CALCIUM PO, Take 2.5 mLs by mouth daily. Liquid calcium, Disp: , Rfl:    cyanocobalamin 2000 MCG tablet, Take 2,000 mcg by mouth daily., Disp: , Rfl:    GARLIC PO, Take 1,761 mg by mouth daily., Disp: , Rfl:    lamoTRIgine (LAMICTAL) 25 MG tablet, TAKE 1 TABLET(25 MG) BY MOUTH EVERY EVENING, Disp: 90 tablet, Rfl: 3   MAGNESIUM PO, Take 1 tablet by mouth daily., Disp: , Rfl:    Multiple Vitamins-Minerals (ZINC PO), Take 2.5 mg by mouth daily., Disp: , Rfl:    NONFORMULARY OR COMPOUNDED ITEM, Vitamin E vaginal cream 200u/ml.  One ml pv twice weekly.  Disp. 3 month supply., Disp: 24 each, Rfl: 3   pyridoxine (B-6) 200 MG tablet, Take 200 mg by mouth daily., Disp: , Rfl:    sulfamethoxazole-trimethoprim (BACTRIM DS) 800-160 MG tablet, Take 1 tablet by mouth 2 (two) times daily., Disp: 6 tablet, Rfl: 0   traMADol (ULTRAM) 50 MG tablet, Take 50 mg by mouth daily., Disp: , Rfl:    Vibegron (GEMTESA) 75 MG TABS, Take 75 mg by mouth every evening. , Disp: , Rfl:    Vitamin D-Vitamin K (VITAMIN K2-VITAMIN D3 PO), Take 1 capsule by mouth daily., Disp: , Rfl:   EXAM: BP Readings from Last 3 Encounters:  02/08/22 (!) 93/56  01/31/22 105/72  12/04/21 135/69    VITALS per patient if applicable:  GENERAL: alert, oriented, appears well and in no acute distress  HEENT: atraumatic, conjunttiva clear, no obvious abnormalities on inspection of external nose and ears  NECK: normal movements of the head and neck  LUNGS: on inspection no signs of respiratory distress, breathing rate appears normal, no obvious gross SOB, gasping or wheezing  CV: no obvious cyanosis  MS: moves all visible extremities without noticeable abnormality  PSYCH/NEURO: pleasant and cooperative, no obvious depression or anxiety, speech and thought processing grossly intact Lab Results  Component Value Date   WBC 6.5 03/30/2020   HGB 10.9 (L) 03/30/2020   HCT 32.5 (L) 03/30/2020    PLT 255.0 03/30/2020   GLUCOSE 90 05/03/2021   CHOL 241 (H) 04/02/2016   TRIG 78.0 04/02/2016   HDL 111.30 04/02/2016   LDLDIRECT 110.1 11/27/2010   LDLCALC 114 (H) 04/02/2016   ALT 18 03/30/2020   AST 22 03/30/2020   NA 138 05/03/2021   K 4.9 05/03/2021   CL 102 05/03/2021   CREATININE 0.83 05/03/2021   BUN 27 (H) 05/03/2021   CO2 30  05/03/2021   TSH 0.83 09/26/2017   INR 1.01 03/25/2016    ASSESSMENT AND PLAN:  Discussed the following assessment and plan:    ICD-10-CM   1. Feeling tired  W97.98 Basic metabolic panel    CBC with Differential/Platelet    Hemoglobin A1c    Hepatic function panel    Lipid panel    TSH    T4, free    C-reactive protein    Sedimentation rate    2. Nausea  X21.1 Basic metabolic panel    CBC with Differential/Platelet    Hemoglobin A1c    Hepatic function panel    Lipid panel    TSH    T4, free    C-reactive protein    Sedimentation rate    3. Chronic bilateral low back pain with sciatica, sciatica laterality unspecified  H41.74 Basic metabolic panel   Y81.44 CBC with Differential/Platelet    Hemoglobin A1c    Hepatic function panel    Lipid panel    TSH    T4, free    C-reactive protein    Sedimentation rate    4. Foot drop, right foot  Y18.563 Basic metabolic panel    CBC with Differential/Platelet    Hemoglobin A1c    Hepatic function panel    Lipid panel    TSH    T4, free    C-reactive protein    Sedimentation rate    5. Right leg weakness  J49.702 Basic metabolic panel    CBC with Differential/Platelet    Hemoglobin A1c    Hepatic function panel    Lipid panel    TSH    T4, free    C-reactive protein    Sedimentation rate    6. Hyperlipidemia, unspecified hyperlipidemia type  O37.8 Basic metabolic panel    CBC with Differential/Platelet    Hemoglobin A1c    Hepatic function panel    Lipid panel    TSH    T4, free    C-reactive protein    Sedimentation rate     Updated labs advised  Continue nap  and monitor  Attention to hcaloruie dense foods  Ccm referral about meds and Pharmacy.   Help   May tolerate low dose cymbalta with time nausea with time.  ( Pain tramadol and trial of Cymbalta causes nausea ) Counseled.  Plan fu after labs etc  in pseron  Expectant management and discussion of plan and treatment with opportunity to ask questions and all were answered. The patient agreed with the plan and demonstrated an understanding of the instructions.   Advised to call back or seek an in-person evaluation if worsening  or having  further concerns  in interim. Return for lab  and then plan fu appt.Shanon Ace, MD

## 2022-03-20 ENCOUNTER — Telehealth: Payer: Self-pay | Admitting: Internal Medicine

## 2022-03-20 NOTE — Chronic Care Management (AMB) (Signed)
  Care Management  Note   03/20/2022 Name: Kaitlyn Flowers MRN: [id number] DOB: 09-13-1940  Kaitlyn Flowers is a 81 y.o. year old female who is a primary care patient of Panosh, Standley Brooking, MD. The care management team was consulted for assistance with chronic disease management and care coordination needs.   Kaitlyn Flowers was given information about Care Management services today including:  CCM service includes personalized support from designated clinical staff supervised by the physician, including individualized plan of care and coordination with other care providers 24/7 contact phone numbers for assistance for urgent and routine care needs. Service will only be billed when office clinical staff spend 20 minutes or more in a month to coordinate care. Only one practitioner may furnish and bill the service in a calendar month. The patient may stop CCM services at amy time (effective at the end of the month) by phone call to the office staff. The patient will be responsible for cost sharing (co-pay) or up to 20% of the service fee (after annual deductible is met)  Patient agreed to services and verbal consent obtained.  Follow up plan:   Face to Face appointment with care management team member scheduled for: 04/03/22 _0   Noelle Penner [employer] Scheduler

## 2022-03-22 ENCOUNTER — Encounter: Payer: Self-pay | Admitting: Obstetrics and Gynecology

## 2022-03-22 ENCOUNTER — Other Ambulatory Visit (INDEPENDENT_AMBULATORY_CARE_PROVIDER_SITE_OTHER): Payer: Medicare PPO

## 2022-03-22 ENCOUNTER — Ambulatory Visit: Payer: Medicare PPO | Admitting: Obstetrics and Gynecology

## 2022-03-22 VITALS — BP 123/70 | HR 85

## 2022-03-22 DIAGNOSIS — G8929 Other chronic pain: Secondary | ICD-10-CM

## 2022-03-22 DIAGNOSIS — M21371 Foot drop, right foot: Secondary | ICD-10-CM | POA: Diagnosis not present

## 2022-03-22 DIAGNOSIS — E785 Hyperlipidemia, unspecified: Secondary | ICD-10-CM

## 2022-03-22 DIAGNOSIS — R29898 Other symptoms and signs involving the musculoskeletal system: Secondary | ICD-10-CM | POA: Diagnosis not present

## 2022-03-22 DIAGNOSIS — R110 Nausea: Secondary | ICD-10-CM | POA: Diagnosis not present

## 2022-03-22 DIAGNOSIS — R5383 Other fatigue: Secondary | ICD-10-CM | POA: Diagnosis not present

## 2022-03-22 DIAGNOSIS — M5441 Lumbago with sciatica, right side: Secondary | ICD-10-CM

## 2022-03-22 DIAGNOSIS — N8110 Cystocele, unspecified: Secondary | ICD-10-CM | POA: Diagnosis not present

## 2022-03-22 DIAGNOSIS — M5440 Lumbago with sciatica, unspecified side: Secondary | ICD-10-CM

## 2022-03-22 DIAGNOSIS — N393 Stress incontinence (female) (male): Secondary | ICD-10-CM | POA: Diagnosis not present

## 2022-03-22 LAB — T4, FREE: Free T4: 0.95 ng/dL (ref 0.60–1.60)

## 2022-03-22 LAB — LIPID PANEL
Cholesterol: 224 mg/dL — ABNORMAL HIGH (ref 0–200)
HDL: 88.7 mg/dL (ref >39.00)
LDL Cholesterol: 124 mg/dL — ABNORMAL HIGH (ref 0–99)
NonHDL: 135.55
Total CHOL/HDL Ratio: 3
Triglycerides: 60 mg/dL (ref 0.0–149.0)
VLDL: 12 mg/dL (ref 0.0–40.0)

## 2022-03-22 LAB — BASIC METABOLIC PANEL
BUN: 28 mg/dL — ABNORMAL HIGH (ref 6–23)
CO2: 28 mEq/L (ref 19–32)
Calcium: 9.4 mg/dL (ref 8.4–10.5)
Chloride: 104 mEq/L (ref 96–112)
Creatinine, Ser: 0.82 mg/dL (ref 0.40–1.20)
GFR: 67.79 mL/min (ref >60.00)
Glucose, Bld: 73 mg/dL (ref 70–99)
Potassium: 4.8 mEq/L (ref 3.5–5.1)
Sodium: 139 mEq/L (ref 135–145)

## 2022-03-22 LAB — HEMOGLOBIN A1C: Hgb A1c MFr Bld: 5.7 % (ref 4.6–6.5)

## 2022-03-22 LAB — HEPATIC FUNCTION PANEL
ALT: 19 U/L (ref 0–35)
AST: 24 U/L (ref 0–37)
Albumin: 4.6 g/dL (ref 3.5–5.2)
Alkaline Phosphatase: 30 U/L — ABNORMAL LOW (ref 39–117)
Bilirubin, Direct: 0.1 mg/dL (ref 0.0–0.3)
Total Bilirubin: 0.4 mg/dL (ref 0.2–1.2)
Total Protein: 6.7 g/dL (ref 6.0–8.3)

## 2022-03-22 LAB — SEDIMENTATION RATE: Sed Rate: 8 mm/hr (ref 0–30)

## 2022-03-22 LAB — CBC WITH DIFFERENTIAL/PLATELET
Basophils Absolute: 0 10*3/uL (ref 0.0–0.1)
Basophils Relative: 0.4 % (ref 0.0–3.0)
Eosinophils Absolute: 0.1 10*3/uL (ref 0.0–0.7)
Eosinophils Relative: 0.8 % (ref 0.0–5.0)
HCT: 32.5 % — ABNORMAL LOW (ref 36.0–46.0)
Hemoglobin: 11 g/dL — ABNORMAL LOW (ref 12.0–15.0)
Lymphocytes Relative: 35.1 % (ref 12.0–46.0)
Lymphs Abs: 2.8 10*3/uL (ref 0.7–4.0)
MCHC: 33.8 g/dL (ref 30.0–36.0)
MCV: 99.1 fl (ref 78.0–100.0)
Monocytes Absolute: 0.6 10*3/uL (ref 0.1–1.0)
Monocytes Relative: 7 % (ref 3.0–12.0)
Neutro Abs: 4.6 10*3/uL (ref 1.4–7.7)
Neutrophils Relative %: 56.7 % (ref 43.0–77.0)
Platelets: 216 10*3/uL (ref 150.0–400.0)
RBC: 3.28 Mil/uL — ABNORMAL LOW (ref 3.87–5.11)
RDW: 14.1 % (ref 11.5–15.5)
WBC: 8.1 10*3/uL (ref 4.0–10.5)

## 2022-03-22 LAB — TSH: TSH: 1.42 u[IU]/mL (ref 0.35–5.50)

## 2022-03-22 LAB — C-REACTIVE PROTEIN: CRP: <1 mg/dL (ref 0.5–20.0)

## 2022-03-22 NOTE — Progress Notes (Signed)
[facility]   Subjective:     Chief Complaint:  Chief Complaint  Patient presents with   Pessary Check   History of Present Illness: Kaitlyn Flowers is a 81 y.o. female with stage II pelvic organ prolapse and stress incontinence who presents for a pessary check. She is using a size 2-1/4in incontinence ring with support pessary. The pessary has been working well and she has no complaints. She is not using vaginal estrogen. She denies vaginal bleeding. Only wearing one panty liner per day, leakage has improved significantly.  No longer taking Gemtesa.  Past Medical History: Patient  has a past medical history of Anemia, Arthritis, Back pain, Cancer ([place]), Depression, Leg weakness, Osteopenia, PONV (postoperative nausea and vomiting), Right leg pain, Seizures ([place]), Spinal cord tumor, Urinary tract infection, and Wears hearing aid.   Past Surgical History: She  has a past surgical history that includes Abdominal hysterectomy (1973); shoulder rotator culff  (Left, 2006); spinal cord tumor (1987); Appendectomy; Rotator cuff repair; Lumbar wound debridement (12/06/2011); Colonoscopy; Anterior lumbar fusion (N/A, 05/17/2019); and Abdominal exposure (N/A, 05/17/2019).   Medications: She has a current medication list which includes the following prescription(s): acetylcysteine, cyanocobalamin, garlic, lamotrigine, magnesium, multiple vitamins-minerals, NONFORMULARY OR COMPOUNDED ITEM, pyridoxine, tramadol, and vitamin d-vitamin k.   Allergies: Patient is allergic to ibuprofen-famotidine, fentanyl, pregabalin, and penicillins.   Social History: Patient  reports that she quit smoking about 54 years ago. Her smoking use included cigarettes. She has a 0.50 pack-year smoking history. She has never used smokeless tobacco. She reports current alcohol use of about 4.0 standard drinks of alcohol per week. She reports that she does not use drugs.      Objective:    Physical Exam: BP  123/70   Pulse 85   LMP  (LMP Unknown)  Gen: No apparent distress, A&O x 3. Detailed Urogynecologic Evaluation:  Pelvic Exam: Normal external female genitalia; Bartholin's and Skene's glands normal in appearance; urethral meatus normal in appearance, no urethral masses or discharge. The pessary was noted to be in place. It was removed and cleaned. Speculum exam revealed no lesions in the vagina. The pessary was replaced. It was comfortable to the patient and fit well.   POP-Q (11/12/21):    POP-Q   -1                                            Aa   -1                                           Ba   -8                                              C    2.5                                            Gh   4  Pb   9                                            tvl    -2                                            Ap   -2                                            Bp                                                  D       Assessment/Plan:    Assessment: Kaitlyn Flowers is a 81 y.o. with stage III pelvic organ prolapse and stress incontinence here for a pessary check. She is doing well.  Plan: - size 2-1/4in incontinence ring with support pessary. She will keep the pessary in place until next visit.  - She will follow-up in 3 months for a pessary check or sooner as needed.  All questions were answered.  Jaquita Folds, MD  Time spent: I spent 20 minutes dedicated to the care of this patient on the date of this encounter to include pre-visit review of records, face-to-face time with the patient and post visit documentation.

## 2022-03-26 DIAGNOSIS — M545 Low back pain, unspecified: Secondary | ICD-10-CM | POA: Diagnosis not present

## 2022-03-26 DIAGNOSIS — M4180 Other forms of scoliosis, site unspecified: Secondary | ICD-10-CM | POA: Diagnosis not present

## 2022-03-28 NOTE — Progress Notes (Signed)
Anemia slightly better , kidney function normal , no diabetes .  No explanation for how your are feeling. But  reassuring results

## 2022-04-02 ENCOUNTER — Telehealth: Payer: Self-pay | Admitting: Pharmacist

## 2022-04-02 NOTE — Chronic Care Management (AMB) (Signed)
    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 No medication changes.  Recent consult visits:  03/22/22 Jaquita Folds, MD (OBGYN) - Patient presented for Prolapse of anterior vaginal wall and other concerns. Stopped Calcium-Vit D. Stopped Bactrum. Stopped Vibegron.  02/08/22 Jaquita Folds, MD (OBGYN) - Patient presented for Stress Urinary Incontinence and other concerns. No medication changes.  01/31/22 Jaquita Folds, MD (OBGYN) - Patient presented for Urinary urgency and other concerns. No medication changes.  01/31/22 Shearon Stalls PAC, Spero Geralds ([facility]) - Patient presented for pain and other concerns. No medication changes.  01/30/22 [name] MD, Blanchie Dessert ([facility]) - Patient presented for back pain. Physical Therapy recommended. No medication changes noted.  12/04/21 Jaquita Folds, MD (OBGYN) - Patient presented for Acute cystitis without hematuria and other concerns. Prescribed Macrobid 100 mg twice daily.  11/28/21 Elita Quick, CMA (Urogynecology) - Patient presented for Urinary frequency and other concerns. Prescribed Bactrum.  11/12/21 Jaquita Folds, MD (OBGYN) - Patient presented for Prolapse of anterior vaginal wall and other concerns. No medication changes. Pessary placed.  10/16/21 Tonia Ghent Best (Audiology) - Patient presented for Sensorineural hearing loss bilateral. No other visit details available.  10/10/21 Royster, Orpha Bur, RMA (Endo) - Patient  presented for Prolia Injection.  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 No medication changes.  Hospital visits:  None in previous 6 months  Medications: Outpatient Encounter Medications as of 04/02/2022  Medication Sig   Acetylcysteine (N-ACETYL-L-CYSTEINE PO) Take 500 mg by mouth daily.   cyanocobalamin 2000 MCG tablet Take 2,000 mcg by mouth daily.   GARLIC PO Take 4,818 mg by mouth daily.   lamoTRIgine (LAMICTAL) 25 MG tablet TAKE 1 TABLET(25 MG) BY MOUTH EVERY EVENING   MAGNESIUM PO Take 1 tablet by mouth daily.   Multiple Vitamins-Minerals (ZINC PO) Take 2.5 mg by mouth daily.   NONFORMULARY OR COMPOUNDED ITEM Vitamin E vaginal cream 200u/ml.  One ml pv twice weekly.  Disp. 3 month supply.   pyridoxine (B-6) 200 MG tablet Take 200 mg by mouth daily.   traMADol (ULTRAM) 50 MG tablet Take 50 mg by mouth daily.   Vitamin D-Vitamin K (VITAMIN K2-VITAMIN D3 PO) Take 1 capsule by mouth daily.   No facility-administered encounter medications on file as of 04/02/2022.  Fill History : LAMOTRIGINE '25MG'$  TABLETS 02/23/2022 90   tramadol 50 mg tablet 04/01/2022 15 45 tablet   DULOXETINE DR '20MG'$  CAPSULES 03/04/2022 30   SOLIFENACIN 5 MG TABLET 12/12/2021 90   Gemtesa 75 mg tablet 02/06/2022 30 30 tablet     Unable to reach patient for initial questions.   Care Gaps: Hepatitis C Screening - Overdue COVID Booster - Overdue TDAP - Overdue Flu Vaccine - Overdue AWV- 12/21 MSG sent to Ramond Craver CMA to schedule. BP- 123/70 03/22/22  Star Rating Drugs: None     Ned Clines CMA Clinical Pharmacist Assistant [phone]

## 2022-04-03 ENCOUNTER — Ambulatory Visit (INDEPENDENT_AMBULATORY_CARE_PROVIDER_SITE_OTHER): Payer: Medicare PPO | Admitting: Pharmacist

## 2022-04-03 DIAGNOSIS — M810 Age-related osteoporosis without current pathological fracture: Secondary | ICD-10-CM

## 2022-04-03 DIAGNOSIS — G909 Disorder of the autonomic nervous system, unspecified: Secondary | ICD-10-CM

## 2022-04-03 DIAGNOSIS — R0989 Other specified symptoms and signs involving the circulatory and respiratory systems: Secondary | ICD-10-CM

## 2022-04-03 NOTE — Patient Instructions (Addendum)
Here are some takeaways for today: Take the duloxetine after dinner time Look into alpha lipoic acid for nerve pain Aim for 1200 mg of calcium per day for your bones  Medications to consider next for nerve pain: Gabapentin Venlafaxine (Effexor)  Maddie Jeni Salles, PharmD, [facility]  Visit Information   Goals Addressed   None    Patient Care Plan: CCM Pharmacy Care Plan     Problem Identified: Problem: Hypertension, Osteoporosis, Nerve pain, Overactive Bladder, and Seizures      Long-Range Goal: Patient-Specific Goal   Start Date: 04/03/2022  Expected End Date: 04/04/2023  This Visit's Progress: On track  Priority: High  Note:   Current Barriers:  Unable to achieve control of neuropathy   Pharmacist Clinical Goal(s):  Patient will achieve improvement in neuropathy as evidenced by patient report of symptoms  through collaboration with PharmD and provider.   Interventions: 1:1 collaboration with Panosh, Standley Brooking, MD regarding development and update of comprehensive plan of care as evidenced by provider attestation and co-signature Inter-disciplinary care team collaboration (see longitudinal plan of care) Comprehensive medication review performed; medication list updated in electronic medical record  Hypertension (BP goal <140/90) -Controlled -Current treatment: No medications -Medications previously tried: unknown  -Current home readings: does not check often -Current dietary habits: did not discuss -Current exercise habits: limited with pain -Denies hypotensive/hypertensive symptoms -Educated on Importance of home blood pressure monitoring; -Counseled to monitor BP at home as needed, document, and provide log at future appointments -Counseled on diet and exercise extensively  Osteoporosis Goal prevent fractures) -Not ideally controlled -Last DEXA Scan: 09/26/20   T-Score femoral neck: -2.5  T-Score  total hip: n/a  T-Score lumbar spine: 0.2  T-Score forearm radius: n/a  10-year probability of major osteoporotic fracture: n/a  10-year probability of hip fracture: n/a -Patient is a candidate for pharmacologic treatment due to T-Score < -2.5 in femoral neck -Current treatment  Prolia inject every 6 months (last 10/10/21) - Appropriate, Effective, Safe, Accessible Vitamin D 5000 units daily - Appropriate, Query effective, Safe, Accessible -Medications previously tried: none  -Recommend [phone] units of vitamin D daily. Recommend 1200 mg of calcium daily from dietary and supplemental sources. Recommend weight-bearing and muscle strengthening exercises for building and maintaining bone density. -Recommended repeat vitamin D with previous low level.  Neuropathy/pain (Goal: minimize pain) -Uncontrolled -Current treatment  Duloxetine 20 mg 1 capsule daily - not currently taking Tramadol 50 mg as needed - Appropriate, Query effective, Safe, Accessible -Medications previously tried: Lyrica (side effects) -Counseled on taking  Recommended repeat vitamin B12 level. Educated on next steps for medications for neuropathy including gabapentin.  Overactive bladder (Goal: minimize symptoms) -Controlled -Current treatment  No medications -Medications previously tried: Gemtesa, Solifenacin , silodosin, oxybutynin - Patient is only using a pessary right now and it is very helpful.  Seizures (Goal: prevent occurrence of seizures) -Controlled -Current treatment  Lamotrigine 25 mg 1 tablet every evening - Appropriate, Effective, Safe, Accessible -Medications previously tried: n/a  -Recommended to continue current medication   Health Maintenance -Vaccine gaps: tetanus, influenza, COVID booster -Current therapy:  N Acetylcysteine 542 mg daily Garlic 7062 mg daily Magnesium daily Vitamin B12 1000 mcg daily Vitamin B6 200 mg daily -Educated on Cost vs benefit of each product must be carefully  weighed by individual consumer -Patient is satisfied with current therapy and denies issues -Recommended to continue current medication  Patient Goals/Self-Care Activities Patient will:  - take medications as prescribed  as evidenced by patient report and record review  Follow Up Plan: The care management team will reach out to the patient again over the next 30 days.       Ms. Rinn was given information about Chronic Care Management services today including:  CCM service includes personalized support from designated clinical staff supervised by her physician, including individualized plan of care and coordination with other care providers 24/7 contact phone numbers for assistance for urgent and routine care needs. Standard insurance, coinsurance, copays and deductibles apply for chronic care management only during months in which we provide at least 20 minutes of these services. Most insurances cover these services at 100%, however patients may be responsible for any copay, coinsurance and/or deductible if applicable. This service may help you avoid the need for more expensive face-to-face services. Only one practitioner may furnish and bill the service in a calendar month. The patient may stop CCM services at any time (effective at the end of the month) by phone call to the office staff.  Patient agreed to services and verbal consent obtained.   Patient verbalizes understanding of instructions and care plan provided today and agrees to view in [place]. Active MyChart status and patient understanding of how to access instructions and care plan via MyChart confirmed with patient.    The pharmacy team will reach out to the patient again over the next 30 days.   Viona Gilmore, [facility]

## 2022-04-03 NOTE — Progress Notes (Unsigned)
Chronic Care Management Pharmacy Note  04/03/2022 Name:  Kaitlyn Flowers MRN:  [id number] DOB:  Sep 21, 1940  Summary: Patient had a lot of questions about duloxetine Pt inquired about having another provider handle pain management  Recommendations/Changes made from today's visit: -Recommended taking duloxetine with a full meal to help with nausea and moving it to bedtime if still experiencing nausea -Consider PCP or neurology neuropathy management  Good afternoon,  I just wanted to reach back out to you to see how you were doing with the duloxetine. Any luck with taking it with food or at a different time of the day?  I was unsuccessful in finding another app that calculates the calcium content in your food but I will keep searching. In the meantime, I attached a helpful hand out that you can use as a guide to calculate on your own. Just a reminder, you want to aim for about 1200 mg per day of calcium.  Best, Maddie  Plan: ***   Subjective: Kaitlyn Flowers is an 81 y.o. year old female who is a primary patient of Panosh, Standley Brooking, MD.  The CCM team was consulted for assistance with disease management and care coordination needs.    Engaged with patient face to face for initial visit in response to provider referral for pharmacy case management and/or care coordination services.   Consent to Services:  The patient was given the following information about Chronic Care Management services today, agreed to services, and gave verbal consent: 1. CCM service includes personalized support from designated clinical staff supervised by the primary care provider, including individualized plan of care and coordination with other care providers 2. 24/7 contact phone numbers for assistance for urgent and routine care needs. 3. Service will only be billed when office clinical staff spend 20 minutes or more in a month to coordinate care. 4. Only one practitioner may furnish and bill the service in  a calendar month. 5.The patient may stop CCM services at any time (effective at the end of the month) by phone call to the office staff. 6. The patient will be responsible for cost sharing (co-pay) of up to 20% of the service fee (after annual deductible is met). Patient agreed to services and consent obtained.  Patient Care Team: Panosh, Standley Brooking, MD as PCP - Lisa Roca, MD (Neurosurgery) Carlyle Basques, MD as Consulting Physician (Infectious Diseases) Cameron Sprang, MD as Consulting Physician (Neurology) Kathie Rhodes, MD (Inactive) as Consulting Physician (Urology) Viona Gilmore, [facility] as Pharmacist (Pharmacist)  Recent office visits: 03/18/22 Panosh, Standley Brooking, MD - Patient presented via video for Feeling tired and other concerns. No medication changes.  Recent consult visits: 03/22/22 Jaquita Folds, MD (OBGYN) - Patient presented for Prolapse of anterior vaginal wall and other concerns. Stopped Calcium-Vit D. Stopped Bactrum. Stopped Vibegron.   02/08/22 Jaquita Folds, MD (OBGYN) - Patient presented for Stress Urinary Incontinence and other concerns. No medication changes.   01/31/22 Jaquita Folds, MD (OBGYN) - Patient presented for Urinary urgency and other concerns. No medication changes.   01/31/22 Shearon Stalls PAC, Spero Geralds (Car Neuro [facility]Spine) - Patient presented for pain and other concerns. No medication changes.   01/30/22 [name] MD, Blanchie Dessert (Car Neuro &Spine) - Patient presented for back pain. Physical Therapy recommended. No medication changes noted.   12/04/21 Jaquita Folds, MD (OBGYN) - Patient presented for Acute cystitis without hematuria and other concerns. Prescribed Macrobid 100 mg twice daily.   11/28/21 Prince Rome  L, CMA (Urogynecology) - Patient presented for Urinary frequency and other concerns. Prescribed Bactrum.   11/12/21 Jaquita Folds, MD (OBGYN) - Patient presented for Prolapse of anterior vaginal wall and other  concerns. No medication changes. Pessary placed.   10/16/21 Tonia Ghent Best (Audiology) - Patient presented for Sensorineural hearing loss bilateral. No other visit details available.   10/10/21 Royster, Orpha Bur, RMA (Endo) - Patient presented for Prolia Injection.   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 No medication changes.  Hospital visits: None in previous 6 months   Objective:  Lab Results  Component Value Date   CREATININE 0.82 03/22/2022   BUN 28 (H) 03/22/2022   GFR 67.79 03/22/2022   GFRNONAA >60 09/19/2019   GFRAA >60 09/19/2019   NA 139 03/22/2022   K 4.8 03/22/2022   CALCIUM 9.4 03/22/2022   CO2 28 03/22/2022   GLUCOSE 73 03/22/2022    Lab Results  Component Value Date/Time   HGBA1C 5.7 03/22/2022 01:08 PM   GFR 67.79 03/22/2022 01:08 PM   GFR 67.23 05/03/2021 09:08 AM    Last diabetic Eye exam: No results found for: "HMDIABEYEEXA"  Last diabetic Foot exam: No results found for: "HMDIABFOOTEX"   Lab Results  Component Value Date   CHOL 224 (H) 03/22/2022   HDL 88.70 03/22/2022   LDLCALC 124 (H) 03/22/2022   LDLDIRECT 110.1 11/27/2010   TRIG 60.0 03/22/2022   CHOLHDL 3 03/22/2022       Latest Ref Rng & Units 03/22/2022    1:08 PM 03/30/2020   11:52 AM 07/22/2018   11:44 AM  Hepatic Function  Total Protein 6.0 - 8.3 g/dL 6.7  6.4  6.8   Albumin 3.5 - 5.2 g/dL 4.6  4.1  4.1   AST 0 - 37 U/L _0 ALT 0 - 35 U/L _1 Alk Phosphatase 39 - 117 U/L 30  30  54   Total Bilirubin 0.2 - 1.2 mg/dL 0.4  0.5  0.5   Bilirubin, Direct 0.0 - 0.3 mg/dL 0.1  0.1      Lab Results  Component Value Date/Time   TSH 1.42 03/22/2022 01:08 PM   TSH 0.83 09/26/2017 04:53 PM   FREET4 0.95 03/22/2022 01:08 PM   FREET4 0.97 02/13/2016 08:53 AM       Latest Ref Rng & Units 03/22/2022     1:08 PM 03/30/2020   11:52 AM 09/18/2019    4:54 PM  CBC  WBC 4.0 - 10.5 K/uL 8.1  6.5  10.1   Hemoglobin 12.0 - 15.0 g/dL 11.0  10.9  11.5   Hematocrit 36.0 - 46.0 % 32.5  32.5  33.2   Platelets 150.0 - 400.0 K/uL 216.0  255.0  228     Lab Results  Component Value Date/Time   VD25OH 26.6 (L) 05/03/2021 09:08 AM   VD25OH 46.63 04/29/2018 09:13 AM   VD25OH 78.21 12/23/2016 11:05 AM    Clinical ASCVD: {YES/NO:21197} The 10-year ASCVD risk score (Arnett DK, et al., 2019) is: 24.1%   Values used to calculate the score:     Age: 66 years     Sex: Female     Is Non-Hispanic African American: No     Diabetic: No     Tobacco smoker: No     Systolic Blood Pressure: 184 mmHg  Is BP treated: No     HDL Cholesterol: 88.7 mg/dL     Total Cholesterol: 224 mg/dL       06/18/2021   11:05 AM 08/14/2020   10:41 AM 06/03/2017    4:19 PM  Depression screen PHQ 2/9  Decreased Interest 0 0 0  Down, Depressed, Hopeless 0 0 1  PHQ - 2 Score 0 0 1  Altered sleeping  0   Tired, decreased energy  0   Change in appetite  0   Feeling bad or failure about yourself   0   Trouble concentrating  0   Moving slowly or fidgety/restless  0   Suicidal thoughts  0   PHQ-9 Score  0   Difficult doing work/chores  Not difficult at all      ***Other: (CHADS2VASc if Afib, MMRC or CAT for COPD, ACT, DEXA)  Social History   Tobacco Use  Smoking Status Former   Packs/day: 0.25   Years: 2.00   Total pack years: 0.50   Types: Cigarettes   Quit date: 09/03/1967   Years since quitting: 54.6  Smokeless Tobacco Never  Tobacco Comments   quit 48 years    BP Readings from Last 3 Encounters:  03/22/22 123/70  02/08/22 (!) 93/56  01/31/22 105/72   Pulse Readings from Last 3 Encounters:  03/22/22 85  02/08/22 82  01/31/22 82   Wt Readings from Last 3 Encounters:  11/12/21 102 lb (46.3 kg)  10/08/21 100 lb (45.4 kg)  09/20/21 102 lb 12.8 oz (46.6 kg)   BMI Readings from Last 3 Encounters:   11/12/21 19.92 kg/m  10/08/21 19.53 kg/m  09/20/21 20.08 kg/m    Assessment/Interventions: Review of patient past medical history, allergies, medications, health status, including review of consultants reports, laboratory and other test data, was performed as part of comprehensive evaluation and provision of chronic care management services.   SDOH:  (Social Determinants of Health) assessments and interventions performed: {yes/no:20286}  SDOH Screenings   Alcohol Screen: Low Risk  (08/14/2020)   Alcohol Screen    Last Alcohol Screening Score (AUDIT): 3  Depression (PHQ2-9): Low Risk  (06/18/2021)   Depression (PHQ2-9)    PHQ-2 Score: 0  Financial Resource Strain: Low Risk  (08/14/2020)   Overall Financial Resource Strain (CARDIA)    Difficulty of Paying Living Expenses: Not hard at all  Food Insecurity: No Food Insecurity (08/14/2020)   Hunger Vital Sign    Worried About Running Out of Food in the Last Year: Never true    Ran Out of Food in the Last Year: Never true  Housing: Low Risk  (08/14/2020)   Housing    Last Housing Risk Score: 0  Physical Activity: Sufficiently Active (08/14/2020)   Exercise Vital Sign    Days of Exercise per Week: 7 days    Minutes of Exercise per Session: 30 min  Social Connections: Moderately Integrated (08/14/2020)   Social Connection and Isolation Panel [NHANES]    Frequency of Communication with Friends and Family: More than three times a week    Frequency of Social Gatherings with Friends and Family: More than three times a week    Attends Religious Services: More than 4 times per year    Active Member of [employer] or Organizations: Yes    Attends Archivist Meetings: More than 4 times per year    Marital Status: Widowed  Stress: No Stress Concern Present (08/14/2020)   [place]  Questionnaire    Feeling of Stress : Not at all  Tobacco Use: Medium Risk (03/22/2022)   Patient  History    Smoking Tobacco Use: Former    Smokeless Tobacco Use: Never    Passive Exposure: Not on file  Transportation Needs: No Transportation Needs (08/14/2020)   PRAPARE - Hydrologist (Medical): No    Lack of Transportation (Non-Medical): No   Patient has had chronic pain and has been seeing [facility] for 10 years. She has been taking tramadol and this is not helping much anymore. She tried a dose of duloxetine and it gave her severe nausea so that is why she wanted to speak with a pharmacist about how to avoid that and or likelihood of it returning. Discussed taking it with food and if nausea returns, she could move it to bedtime to see if it would bother her at all while she sleeps. Patient took it first thing in the morning with food but it was only a piece of toast and peanut butter, which may not have been enough for her.   Patient reports she is constantly tired and in pain. She does report sleeping really well generally but this morning she woke up really early. She normally sleeps 8 hours and only gets up once during the night to go to the bathroom.   81th birthday on the [birth year] - leaving for a trip  -tried lyrica in the past = woozy and can't function; don't think she tried   -vitamin B12 - 1000 mcg  - Panosh order vitamin B12 level  -one day of it with duloxetine -   Panosh - can she take over pain   Has done PT and still doing it  Pilates a couple times a week - walking every day, PT didn't help with the pain; aquatic therapy - flare up? Pool in her community - going to help   Delice Lesch - could she manage?  Vitamin D  - recheck?  Last vitamin D Lab Results  Component Value Date   VD25OH 26.6 (L) 05/03/2021     CCM Care Plan  Allergies  Allergen Reactions   Ibuprofen-Famotidine Other (See Comments)    Elevated BP   Fentanyl Other (See Comments)    Constipates for about 2 weeks   Pregabalin Swelling    AMS, Swelling    Penicillins Rash    Did it involve swelling of the face/tongue/throat, SOB, or low BP? No Did it involve sudden or severe rash/hives, skin peeling, or any reaction on the inside of your mouth or nose? No Did you need to seek medical attention at a hospital or doctor's office? No When did it last happen?      30+ years If all above answers are "NO", may proceed with cephalosporin use.     Medications Reviewed Today     Reviewed by Jaquita Folds, MD (Physician) on 03/22/22 at 1047  Med List Status: <None>   Medication Order Taking? Sig Documenting Provider Last Dose Status Informant  Acetylcysteine (N-ACETYL-L-CYSTEINE PO) [id number] Yes Take 500 mg by mouth daily. [provider] Taking Active Self  cyanocobalamin 2000 MCG tablet [id number] Yes Take 2,000 mcg by mouth daily. [provider] Taking Active Self  GARLIC PO [id number] Yes Take 1,000 mg by mouth daily. [provider] Taking Active Self  lamoTRIgine (LAMICTAL) 25 MG tablet [id number] Yes TAKE 1 TABLET(25 MG) BY MOUTH EVERY EVENING Cameron Sprang, MD Taking Active  MAGNESIUM PO [id number] Yes Take 1 tablet by mouth daily. [provider] Taking Active Self  Multiple Vitamins-Minerals (ZINC PO) [id number] Yes Take 2.5 mg by mouth daily. [provider] Taking Active Self  NONFORMULARY OR COMPOUNDED ITEM [id number] Yes Vitamin E vaginal cream 200u/ml.  One ml pv twice weekly.  Disp. 3 month supply. Megan Salon, MD Taking Active   pyridoxine (B-6) 200 MG tablet [id number] Yes Take 200 mg by mouth daily. [provider] Taking Active Self  traMADol (ULTRAM) 50 MG tablet [id number] Yes Take 50 mg by mouth daily. [provider] Taking Active Self  Vitamin D-Vitamin K (VITAMIN K2-VITAMIN D3 PO) [id number] Yes Take 1 capsule by mouth daily. [provider] Taking Active Self            Patient Active Problem List   Diagnosis Date Noted   Foot drop, right  foot 02/08/2021   Midline cystocele 12/31/2019   Spondylolisthesis at L5-S1 level 05/17/2019   Tension-type headache, not intractable 06/11/2017   Right leg weakness 06/11/2017   Labile hypertension 09/27/2016   Autonomic dysfunction 09/27/2016   Localization-related idiopathic epilepsy and epileptic syndromes with seizures of localized onset, not intractable, without status epilepticus ([place]) 08/01/2016   Hyponatremia 04/01/2016   Nocturnal leg cramps 12/07/2015   Spinal stenosis, lumbar region, 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   Osteoporosis 12/26/2013   Glucosuria 12/16/2012   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/11/2011   Fatigue 11/11/2011   Medicare annual wellness visit, initial 12/18/2010   Wears hearing aid 12/18/2010   Back pain 11/23/2010   ABDOMINAL BRUIT 07/21/2009    Immunization History  Administered Date(s) Administered   Fluad Quad(high Dose 65+) 08/11/2019   Influenza Split 05/29/2012   Influenza Whole 06/30/2007   Influenza, High Dose Seasonal PF 07/18/2014, 05/23/2015, 06/21/2016, 09/07/2018   Influenza,inj,Quad PF,6+ Mos 06/03/2017   PFIZER(Purple Top)SARS-COV-2 Vaccination 12/16/2019, 01/10/2020, 08/02/2020   Pneumococcal Conjugate-13 06/21/2016   Pneumococcal Polysaccharide-23 12/18/2010   Tdap 12/18/2010   Zoster Recombinat (Shingrix) 06/13/2020, 08/13/2020   Zoster, Live 06/12/2012    Conditions to be addressed/monitored:  Hypertension, Osteoporosis, Overactive Bladder, and Seizures  There are no care plans that you recently modified to display for this patient.     Overactive bladder (Goal: ***) -{US controlled/uncontrolled:25276} -Current treatment  Solifenacin 5 mg 1 tablet daily - ? Not taking? Gemtesa 75 mg 1 tablet daily - ? -Medications previously tried: silodosin, oxybutynin  -{CCMPHARMDINTERVENTION:25122} -pessary using this  Seizures (Goal: ***) -{US controlled/uncontrolled:25276} -Current treatment  Lamotrigine 25 mg  1 tablet every evening -Medications previously tried: ***  -{CCMPHARMDINTERVENTION:25122}  -calcium calculator -  Health Maintenance -Vaccine gaps: *** -Current therapy:  Acetylcysteine 536 mg daily Garlic daily Magnesium Vitamin B12 2000 mcg daily Zinc? Vitamin B6 200 mg daily Vitamin D-vitamin K -Educated on {ccm supplement counseling:25128} -{CCM Patient satisfied:25129} -{CCMPHARMDINTERVENTION:25122}  Patient Goals/Self-Care Activities Patient will:  - {pharmacypatientgoals:24919}  Follow Up Plan: {CM FOLLOW UP UYQI:34742}   Medication Assistance: {MEDASSISTANCEINFO:25044}  Compliance/Adherence/Medication fill history: Care Gaps: Hep C screening, tetanus, influenza, COVID booster BP- 123/70 03/22/22  Star-Rating Drugs: None  Patient's preferred pharmacy is:  [address] [place] [address] Phone: [phone] Fax: [phone]  [facility] [place], [address] [place] [address] Phone: [phone] Fax: [phone]  Uses pill box? Yes Pt endorses 99% compliance  We discussed: {Pharmacy options:24294} Patient decided to: {US Pharmacy [facility]  Care Plan and Follow Up Patient Decision:  {FOLLOWUP:24991}  Plan: {CM FOLLOW UP XUCJ:67011}  Jeni Salles, PharmD, [facility] Clinical Pharmacist {MP practice sites:26434} [phone]

## 2022-04-08 DIAGNOSIS — F1120 Opioid dependence, uncomplicated: Secondary | ICD-10-CM | POA: Diagnosis not present

## 2022-04-08 DIAGNOSIS — M4180 Other forms of scoliosis, site unspecified: Secondary | ICD-10-CM | POA: Diagnosis not present

## 2022-04-26 DIAGNOSIS — H6123 Impacted cerumen, bilateral: Secondary | ICD-10-CM | POA: Diagnosis not present

## 2022-04-26 DIAGNOSIS — H903 Sensorineural hearing loss, bilateral: Secondary | ICD-10-CM | POA: Diagnosis not present

## 2022-04-29 ENCOUNTER — Ambulatory Visit (INDEPENDENT_AMBULATORY_CARE_PROVIDER_SITE_OTHER): Payer: Medicare PPO

## 2022-04-29 VITALS — Ht 59.0 in | Wt 102.0 lb

## 2022-04-29 DIAGNOSIS — Z0000 Encounter for general adult medical examination without abnormal findings: Secondary | ICD-10-CM | POA: Diagnosis not present

## 2022-04-29 NOTE — Progress Notes (Signed)
I connected with Kaitlyn Flowers today by telephone and verified that I am speaking with the correct person using two identifiers. Location patient: home Location provider: work Persons participating in the virtual [place], Glenna Durand LPN.   I discussed the limitations, risks, security and privacy concerns of performing an evaluation and management service by telephone and the availability of in person appointments. I also discussed with the patient that there may be a patient responsible charge related to this service. The patient expressed understanding and verbally consented to this telephonic visit.    Interactive audio and video telecommunications were attempted between this provider and patient, however failed, due to patient having technical difficulties OR patient did not have access to video capability.  We continued and completed visit with audio only.     Vital signs may be patient reported or missing.  Subjective:   Kaitlyn Flowers is a 81 y.o. female who presents for Medicare Annual (Subsequent) preventive examination.  Review of Systems     Cardiac Risk Factors include: advanced age (>77mn, >>60women);hypertension     Objective:    Today's Vitals   04/29/22 0926  Weight: 102 lb (46.3 kg)  Height: '4\' 11"'$  (1.499 m)  PainSc: 7    Body mass index is 20.6 kg/m.     04/29/2022    9:32 AM 10/08/2021    4:14 PM 11/08/2020    7:01 AM 08/14/2020   10:39 AM 03/22/2020   11:28 AM 08/16/2019    9:16 PM 05/17/2019   12:00 PM  Advanced Directives  Does Patient Have a Medical Advance Directive? Yes No Yes Yes Yes No Yes  Type of AParamedicof AF[place]Living will  [place]Living will [place]akevilleLiving will [place]rightLiving will  [place]en[place]Living will  Does patient want to make changes to medical advance directive?   No - Patient declined No - Patient  declined   No - Patient declined  Copy of [place] Chart? Yes - validated most recent copy scanned in chart (See row information)  No - copy requested Yes - validated most recent copy scanned in chart (See row information)   Yes - validated most recent copy scanned in chart (See row information)  Would patient like information on creating a medical advance directive?      No - Patient declined     Current Medications (verified) Outpatient Encounter Medications as of 04/29/2022  Medication Sig   Cholecalciferol (VITAMIN D3) 125 MCG (5000 UT) TABS Take 1 tablet by mouth daily.   cyanocobalamin 1000 MCG tablet Take 1,000 mcg by mouth daily.   GARLIC PO Take 14,403mg by mouth daily.   lamoTRIgine (LAMICTAL) 25 MG tablet TAKE 1 TABLET(25 MG) BY MOUTH EVERY EVENING   MAGNESIUM PO Take 1 tablet by mouth daily.   NONFORMULARY OR COMPOUNDED ITEM Vitamin E vaginal cream 200u/ml.  One ml pv twice weekly.  Disp. 3 month supply.   pyridoxine (B-6) 200 MG tablet Take 200 mg by mouth daily.   traMADol (ULTRAM) 50 MG tablet Take 50 mg by mouth daily.   Acetylcysteine (N-ACETYL-L-CYSTEINE PO) Take 500 mg by mouth daily. (Patient not taking: Reported on 04/29/2022)   No facility-administered encounter medications on file as of 04/29/2022.    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   Urinary tract infection    Wears hearing aid    Past Surgical History:  Procedure Laterality Date   ABDOMINAL EXPOSURE  N/A 05/17/2019   Procedure: ABDOMINAL EXPOSURE;  Surgeon: Angelia Mould, MD;  Location: [facility];  Service: Vascular;  Laterality: N/A;   ABDOMINAL HYSTERECTOMY  1973   1/2 of ovary left cyst on ovary   ANTERIOR LUMBAR FUSION N/A 05/17/2019   Procedure: Lumbar five Sacral one  Anterior lumbar interbody fusion;  Surgeon: Kristeen Miss, MD;  Location: [facility];  Service: Neurosurgery;  Laterality: N/A;   APPENDECTOMY     removed with hysterectomy   COLONOSCOPY     LUMBAR WOUND DEBRIDEMENT  12/06/2011   Procedure: LUMBAR WOUND DEBRIDEMENT;  Surgeon: Kristeen Miss, MD;  Location: [facility];  Service: Neurosurgery;  Laterality: N/A;  Repair of Pseudomeningocele Lumbar wound   ROTATOR CUFF REPAIR     left    shoulder rotator culff  Left 2006   spinal cord tumor  1987   2012 october    Family History  Problem Relation Age of Onset   Stroke Mother        2010   Heart attack Father    Cancer Paternal Grandmother    Social History   Socioeconomic History   Marital status: Widowed    Spouse name: Not on file   Number of children: Not on file   Years of education: Not on file   Highest education level: Not on file  Occupational History   Not on file  Tobacco Use   Smoking status: Former    Packs/day: 0.25    Years: 2.00    Total pack years: 0.50    Types: Cigarettes    Quit date: 09/03/1967    Years since quitting: 54.6   Smokeless tobacco: Never   Tobacco comments:    quit 48 years   Vaping Use   Vaping Use: Never used  Substance and Sexual Activity   Alcohol use: Yes    Alcohol/week: 4.0 standard drinks of alcohol    Types: 4 Glasses of wine per week    Comment: 4 a week   Drug use: No   Sexual activity: Not Currently    Partners: Male    Birth control/protection: Surgical    Comment: hysterectomy  Other Topics Concern   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   Born in [place] in [place] since [place]    Right handed   One story home   Lives alone   Social Determinants of Health   Financial Resource Strain: Low Risk  (04/29/2022)   Overall Financial Resource Strain (CARDIA)    Difficulty of Paying Living Expenses: Not hard at all  Food Insecurity: No Food Insecurity (04/29/2022)   Hunger Vital Sign    Worried About Running Out of Food in the Last Year: Never true    Ran Out of Food in the Last Year: Never true  Transportation Needs: No Transportation Needs (  04/29/2022)   PRAPARE - Hydrologist (Medical): No    Lack of Transportation (Non-Medical): No  Physical Activity: Sufficiently Active (04/29/2022)   Exercise Vital Sign    Days of Exercise per Week: 7 days    Minutes of Exercise per Session: 30 min  Stress: No Stress Concern Present (04/29/2022)   [place]    Feeling of Stress : Not at all  Social Connections: Moderately Integrated (08/14/2020)   Social Connection and Isolation Panel [NHANES]    Frequency of Communication with Friends and Family: More than three times a week    Frequency of Social Gatherings with Friends and Family: More than three times a week    Attends Religious Services: More than 4 times per year    Active Member of [employer] or Organizations: Yes    Attends Archivist Meetings: More than 4 times per year    Marital Status: Widowed    Tobacco Counseling Counseling given: Not Answered Tobacco comments: quit 48 years    Clinical Intake:  Pre-visit preparation completed: Yes  Pain : 0-10 Pain Score: 7  Pain Type: Neuropathic pain Pain Location: Back Pain Orientation: Lower Pain Radiating Towards: down legs Pain Descriptors / Indicators: Sharp Pain Onset: More than a month ago Pain Frequency: Constant     Nutritional Status: BMI of 19-24  Normal Nutritional Risks: None Diabetes: No  How often do you need to have someone help you when you  read instructions, pamphlets, or other written materials from your doctor or pharmacy?: 1 - Never What is the last grade level you completed in school?: PhD  Diabetic? no  Interpreter Needed?: No  Information entered by :: NAllen LPN   Activities of Daily Living    04/29/2022    9:34 AM  In your present state of health, do you have any difficulty performing the following activities:  Hearing? 0  Comment hearing aids  Vision? 0  Difficulty concentrating or making decisions? 0  Comment sometimes  Walking or climbing stairs? 1  Dressing or bathing? 0  Doing errands, shopping? 0  Preparing Food and eating ? N  Using the Toilet? N  In the past six months, have you accidently leaked urine? N  Do you have problems with loss of bowel control? N  Managing your Medications? N  Managing your Finances? N  Housekeeping or managing your Housekeeping? Y    Patient Care Team: Burnis Medin, MD as PCP - Lisa Roca, MD (Neurosurgery) Carlyle Basques, MD as Consulting Physician (Infectious Diseases) Cameron Sprang, MD as Consulting Physician (Neurology) Kathie Rhodes, MD (Inactive) as Consulting Physician (Urology) Viona Gilmore, [facility] as Pharmacist (Pharmacist)  Indicate any recent Medical Services you may have received from other than [facility] providers in the past year (date may be approximate).     Assessment:   This is a routine wellness examination for [place].  Hearing/Vision screen Vision Screening - Comments:: Regular eye exams, [facility]  Dietary issues and exercise activities discussed: Current Exercise Habits: Structured exercise class, Type of exercise: walking, Time (Minutes): 30, Frequency (Times/Week): 7, Weekly Exercise (Minutes/Week): 210   Goals Addressed             This Visit's Progress    Patient Stated       04/29/2022, wants to get stronger       Depression Screen    04/29/2022    9:34 AM  06/18/2021   11:05 AM 08/14/2020   10:41  AM 06/03/2017    4:19 PM 04/24/2017    2:42 PM 11/20/2015   10:33 AM 11/13/2015    2:05 PM  PHQ 2/9 Scores  PHQ - 2 Score 0 0 0 '1 1 1 '$ 0  PHQ- 9 Score   0   1     Fall Risk    04/29/2022    9:32 AM 10/08/2021    4:14 PM 08/14/2020   10:40 AM 03/22/2020   11:28 AM 09/08/2019    8:17 AM  Fall Risk   Falls in the past year? 1 0 0 0 0  Comment tripped over a root, tripped over door jam      Number falls in past yr: 1 0 0 0 0  Injury with Fall? 0 0 0 0 0  Risk for fall due to : Impaired balance/gait;Medication side effect;History of fall(s)  Medication side effect    Follow up Falls evaluation completed;Education provided;Falls prevention discussed  Falls evaluation completed;Falls prevention discussed  Falls evaluation completed    FALL RISK PREVENTION PERTAINING TO THE HOME:  Any stairs in or around the home? No  If so, are there any without handrails? N/a Home free of loose throw rugs in walkways, pet beds, electrical cords, etc? Yes  Adequate lighting in your home to reduce risk of falls? Yes   ASSISTIVE DEVICES UTILIZED TO PREVENT FALLS:  Life alert? No  Use of a cane, walker or w/c? Yes  Grab bars in the bathroom? Yes  Shower chair or bench in shower? Yes  Elevated toilet seat or a handicapped toilet? No   TIMED UP AND GO:  Was the test performed? No .       Cognitive Function:        04/29/2022    9:35 AM 08/14/2020   10:44 AM  6CIT Screen  What Year? 0 points 0 points  What month? 0 points 0 points  What time? 0 points 0 points  Count back from 20 0 points 0 points  Months in reverse 0 points 0 points  Repeat phrase 0 points 2 points  Total Score 0 points 2 points    Immunizations Immunization History  Administered Date(s) Administered   Fluad Quad(high Dose 65+) 08/11/2019   Influenza Split 05/29/2012   Influenza Whole 06/30/2007   Influenza, High Dose Seasonal PF 07/18/2014, 05/23/2015, 06/21/2016, 09/07/2018   Influenza,inj,Quad PF,6+ Mos 06/03/2017    Moderna Covid-19 Vaccine Bivalent Booster 58yr & up 08/08/2021   PFIZER(Purple Top)SARS-COV-2 Vaccination 12/16/2019, 01/10/2020, 08/02/2020, 02/12/2021   Pneumococcal Conjugate-13 06/21/2016   Pneumococcal Polysaccharide-23 12/18/2010   Tdap 12/18/2010   Zoster Recombinat (Shingrix) 06/13/2020, 08/13/2020   Zoster, Live 06/12/2012    TDAP status: Due, Education has been provided regarding the importance of this vaccine. Advised may receive this vaccine at local pharmacy or Health Dept. Aware to provide a copy of the vaccination record if obtained from local pharmacy or Health Dept. Verbalized acceptance and understanding.  Flu Vaccine status: Due, Education has been provided regarding the importance of this vaccine. Advised may receive this vaccine at local pharmacy or Health Dept. Aware to provide a copy of the vaccination record if obtained from local pharmacy or Health Dept. Verbalized acceptance and understanding.  Pneumococcal vaccine status: Up to date  Covid-19 vaccine status: Completed vaccines  Qualifies for Shingles Vaccine? Yes   Zostavax completed Yes   Shingrix Completed?: Yes  Screening Tests Health Maintenance  Topic Date  Due   TETANUS/TDAP  12/17/2020   COVID-19 Vaccine (6 - Pfizer risk series) 10/03/2021   INFLUENZA VACCINE  04/02/2022   Pneumonia Vaccine 35+ Years old  Completed   DEXA SCAN  Completed   Zoster Vaccines- Shingrix  Completed   HPV VACCINES  Aged Out    Health Maintenance  Health Maintenance Due  Topic Date Due   TETANUS/TDAP  12/17/2020   COVID-19 Vaccine (6 - Pfizer risk series) 10/03/2021   INFLUENZA VACCINE  04/02/2022    Colorectal cancer screening: No longer required.   Mammogram status: No longer required due to age.  Bone Density status: Completed 09/26/2020.  Lung Cancer Screening: (Low Dose CT Chest recommended if Age 44-80 years, 30 pack-year currently smoking OR have quit w/in 15years.) does not qualify.   Lung Cancer  Screening Referral: no  Additional Screening:  Hepatitis C Screening: does not qualify;   Vision Screening: Recommended annual ophthalmology exams for early detection of glaucoma and other disorders of the eye. Is the patient up to date with their annual eye exam?  Yes  Who is the provider or what is the name of the office in which the patient attends annual eye exams? [facility] If pt is not established with a provider, would they like to be referred to a provider to establish care? No .   Dental Screening: Recommended annual dental exams for proper oral hygiene  Community Resource Referral / Chronic Care Management: CRR required this visit?  No   CCM required this visit?  No      Plan:     I have personally reviewed and noted the following in the patient's chart:   Medical and social history Use of alcohol, tobacco or illicit drugs  Current medications and supplements including opioid prescriptions. Patient is not currently taking opioid prescriptions. Functional ability and status Nutritional status Physical activity Advanced directives List of other physicians Hospitalizations, surgeries, and ER visits in previous 12 months Vitals Screenings to include cognitive, depression, and falls Referrals and appointments  In addition, I have reviewed and discussed with patient certain preventive protocols, quality metrics, and best practice recommendations. A written personalized care plan for preventive services as well as general preventive health recommendations were provided to patient.     Kellie Simmering, LPN   9/37/9024   Nurse Notes: none  Due to this being a virtual visit, the after visit summary with patients personalized plan was offered to patient via mail or my-chart. Patient would like to access on my-chart

## 2022-04-29 NOTE — Patient Instructions (Signed)
[name] , Thank you for taking time to come for your Medicare Wellness Visit. I appreciate your ongoing commitment to your health goals. Please review the following plan we discussed and let me know if I can assist you in the future.   Screening recommendations/referrals: Colonoscopy: not required Mammogram: not required Bone Density: completed 09/26/2020 Recommended yearly ophthalmology/optometry visit for glaucoma screening and checkup Recommended yearly dental visit for hygiene and checkup  Vaccinations: Influenza vaccine: due Pneumococcal vaccine: completed 06/21/2016 Tdap vaccine: due Shingles vaccine: completed   Covid-19: 08/08/2021, 02/12/2021, 08/02/2020, 01/10/2020, 12/16/2019  Advanced directives: copy in chart  Conditions/risks identified: none  Next appointment: Follow up in one year for your annual wellness visit    Preventive Care 33 Years and Older, Female Preventive care refers to lifestyle choices and visits with your health care provider that can promote health and wellness. What does preventive care include? A yearly physical exam. This is also called an annual well check. Dental exams once or twice a year. Routine eye exams. Ask your health care provider how often you should have your eyes checked. Personal lifestyle choices, including: Daily care of your teeth and gums. Regular physical activity. Eating a healthy diet. Avoiding tobacco and drug use. Limiting alcohol use. Practicing safe sex. Taking low-dose aspirin every day. Taking vitamin and mineral supplements as recommended by your health care provider. What happens during an annual well check? The services and screenings done by your health care provider during your annual well check will depend on your age, overall health, lifestyle risk factors, and family history of disease. Counseling  Your health care provider may ask you questions about your: Alcohol use. Tobacco use. Drug use. Emotional  well-being. Home and relationship well-being. Sexual activity. Eating habits. History of falls. Memory and ability to understand (cognition). Work and work Statistician. Reproductive health. Screening  You may have the following tests or measurements: Height, weight, and BMI. Blood pressure. Lipid and cholesterol levels. These may be checked every 5 years, or more frequently if you are over 42 years old. Skin check. Lung cancer screening. You may have this screening every year starting at age 96 if you have a 30-pack-year history of smoking and currently smoke or have quit within the past 15 years. Fecal occult blood test (FOBT) of the stool. You may have this test every year starting at age 63. Flexible sigmoidoscopy or colonoscopy. You may have a sigmoidoscopy every 5 years or a colonoscopy every 10 years starting at age 50. Hepatitis C blood test. Hepatitis B blood test. Sexually transmitted disease (STD) testing. Diabetes screening. This is done by checking your blood sugar (glucose) after you have not eaten for a while (fasting). You may have this done every 1-3 years. Bone density scan. This is done to screen for osteoporosis. You may have this done starting at age 74. Mammogram. This may be done every 1-2 years. Talk to your health care provider about how often you should have regular mammograms. Talk with your health care provider about your test results, treatment options, and if necessary, the need for more tests. Vaccines  Your health care provider may recommend certain vaccines, such as: Influenza vaccine. This is recommended every year. Tetanus, diphtheria, and acellular pertussis (Tdap, Td) vaccine. You may need a Td booster every 10 years. Zoster vaccine. You may need this after age 24. Pneumococcal 13-valent conjugate (PCV13) vaccine. One dose is recommended after age 87. Pneumococcal polysaccharide (PPSV23) vaccine. One dose is recommended after age 79. Talk  to your  health care provider about which screenings and vaccines you need and how often you need them. This information is not intended to replace advice given to you by your health care provider. Make sure you discuss any questions you have with your health care provider. Document Released: 09/15/2015 Document Revised: 05/08/2016 Document Reviewed: 06/20/2015 Elsevier Interactive Patient Education  2017 [place] Prevention in the Home Falls can cause injuries. They can happen to people of all ages. There are many things you can do to make your home safe and to help prevent falls. What can I do on the outside of my home? Regularly fix the edges of walkways and driveways and fix any cracks. Remove anything that might make you trip as you walk through a door, such as a raised step or threshold. Trim any bushes or trees on the path to your home. Use bright outdoor lighting. Clear any walking paths of anything that might make someone trip, such as rocks or tools. Regularly check to see if handrails are loose or broken. Make sure that both sides of any steps have handrails. Any raised decks and porches should have guardrails on the edges. Have any leaves, snow, or ice cleared regularly. Use sand or salt on walking paths during winter. Clean up any spills in your garage right away. This includes oil or grease spills. What can I do in the bathroom? Use night lights. Install grab bars by the toilet and in the tub and shower. Do not use towel bars as grab bars. Use non-skid mats or decals in the tub or shower. If you need to sit down in the shower, use a plastic, non-slip stool. Keep the floor dry. Clean up any water that spills on the floor as soon as it happens. Remove soap buildup in the tub or shower regularly. Attach bath mats securely with double-sided non-slip rug tape. Do not have throw rugs and other things on the floor that can make you trip. What can I do in the bedroom? Use night  lights. Make sure that you have a light by your bed that is easy to reach. Do not use any sheets or blankets that are too big for your bed. They should not hang down onto the floor. Have a firm chair that has side arms. You can use this for support while you get dressed. Do not have throw rugs and other things on the floor that can make you trip. What can I do in the kitchen? Clean up any spills right away. Avoid walking on wet floors. Keep items that you use a lot in easy-to-reach places. If you need to reach something above you, use a strong step stool that has a grab bar. Keep electrical cords out of the way. Do not use floor polish or wax that makes floors slippery. If you must use wax, use non-skid floor wax. Do not have throw rugs and other things on the floor that can make you trip. What can I do with my stairs? Do not leave any items on the stairs. Make sure that there are handrails on both sides of the stairs and use them. Fix handrails that are broken or loose. Make sure that handrails are as long as the stairways. Check any carpeting to make sure that it is firmly attached to the stairs. Fix any carpet that is loose or worn. Avoid having throw rugs at the top or bottom of the stairs. If you do have throw rugs,  attach them to the floor with carpet tape. Make sure that you have a light switch at the top of the stairs and the bottom of the stairs. If you do not have them, ask someone to add them for you. What else can I do to help prevent falls? Wear shoes that: Do not have high heels. Have rubber bottoms. Are comfortable and fit you well. Are closed at the toe. Do not wear sandals. If you use a stepladder: Make sure that it is fully opened. Do not climb a closed stepladder. Make sure that both sides of the stepladder are locked into place. Ask someone to hold it for you, if possible. Clearly mark and make sure that you can see: Any grab bars or handrails. First and last  steps. Where the edge of each step is. Use tools that help you move around (mobility aids) if they are needed. These include: Canes. Walkers. Scooters. Crutches. Turn on the lights when you go into a dark area. Replace any light bulbs as soon as they burn out. Set up your furniture so you have a clear path. Avoid moving your furniture around. If any of your floors are uneven, fix them. If there are any pets around you, be aware of where they are. Review your medicines with your doctor. Some medicines can make you feel dizzy. This can increase your chance of falling. Ask your doctor what other things that you can do to help prevent falls. This information is not intended to replace advice given to you by your health care provider. Make sure you discuss any questions you have with your health care provider. Document Released: 06/15/2009 Document Revised: 01/25/2016 Document Reviewed: 09/23/2014 Elsevier Interactive Patient Education  2017 [employer].

## 2022-05-02 ENCOUNTER — Telehealth: Payer: Self-pay

## 2022-05-02 DIAGNOSIS — I10 Essential (primary) hypertension: Secondary | ICD-10-CM

## 2022-05-02 DIAGNOSIS — M810 Age-related osteoporosis without current pathological fracture: Secondary | ICD-10-CM | POA: Diagnosis not present

## 2022-05-02 NOTE — Telephone Encounter (Signed)
Last Prolia inj 04/29/22 Next Prolia inj due 11/01/22

## 2022-05-02 NOTE — Telephone Encounter (Signed)
Attempt to reach patient in regards to Dr. Regis Bill message from [facility] chart about scheduling an appointment to discuss on medication.   Left a voicemail to call back.

## 2022-05-03 ENCOUNTER — Ambulatory Visit: Payer: Medicare PPO | Admitting: Internal Medicine

## 2022-05-07 NOTE — Telephone Encounter (Signed)
Attempted to reach patient again in regards to previous message. See note  Left a voicemail to call us back.

## 2022-05-08 ENCOUNTER — Other Ambulatory Visit (HOSPITAL_COMMUNITY)
Admission: RE | Admit: 2022-05-08 | Discharge: 2022-05-08 | Disposition: A | Discharge status: Home / Self Care | Payer: Medicare PPO | Attending: Obstetrics and Gynecology | Admitting: Obstetrics and Gynecology

## 2022-05-08 ENCOUNTER — Ambulatory Visit (INDEPENDENT_AMBULATORY_CARE_PROVIDER_SITE_OTHER): Payer: Medicare PPO

## 2022-05-08 DIAGNOSIS — R350 Frequency of micturition: Secondary | ICD-10-CM

## 2022-05-08 DIAGNOSIS — Z85828 Personal history of other malignant neoplasm of skin: Secondary | ICD-10-CM | POA: Diagnosis not present

## 2022-05-08 DIAGNOSIS — L821 Other seborrheic keratosis: Secondary | ICD-10-CM | POA: Diagnosis not present

## 2022-05-08 DIAGNOSIS — L570 Actinic keratosis: Secondary | ICD-10-CM | POA: Diagnosis not present

## 2022-05-08 DIAGNOSIS — D1801 Hemangioma of skin and subcutaneous tissue: Secondary | ICD-10-CM | POA: Diagnosis not present

## 2022-05-08 DIAGNOSIS — I788 Other diseases of capillaries: Secondary | ICD-10-CM | POA: Diagnosis not present

## 2022-05-08 DIAGNOSIS — D225 Melanocytic nevi of trunk: Secondary | ICD-10-CM | POA: Diagnosis not present

## 2022-05-08 DIAGNOSIS — L814 Other melanin hyperpigmentation: Secondary | ICD-10-CM | POA: Diagnosis not present

## 2022-05-08 LAB — POCT URINALYSIS DIPSTICK
Bilirubin, UA: NEGATIVE
Blood, UA: NEGATIVE
Glucose, UA: NEGATIVE
Ketones, UA: NEGATIVE
Leukocytes, UA: NEGATIVE
Nitrite, UA: NEGATIVE
Protein, UA: NEGATIVE
Spec Grav, UA: 1.025 (ref 1.010–1.025)
Urobilinogen, UA: 0.2 E.U./dL
pH, UA: 5.5 (ref 5.0–8.0)

## 2022-05-08 NOTE — Patient Instructions (Signed)
Your Urine dip that was done in office was Negative. I am sending the urine off for culture and you can take AZO over the counter for your discomfort.  We will contact you when the results are back between 3-5 days. If a  antibiotic is needed we will sent the order to the pharmacy and you will be notified. If you have any questions or concerns please feel free to call us at [phone]

## 2022-05-08 NOTE — Progress Notes (Signed)
Kaitlyn Flowers is a 81 y.o. female arrived today with UTI sx of burning when urinating and irritation. A urine specimen was collected and POCT Urine was done. POCT Urine was Negative but still sent for culture

## 2022-05-09 LAB — URINE CULTURE: Culture: NO GROWTH

## 2022-05-10 ENCOUNTER — Ambulatory Visit: Payer: Medicare PPO | Admitting: Internal Medicine

## 2022-05-15 ENCOUNTER — Other Ambulatory Visit: Payer: Self-pay

## 2022-05-15 DIAGNOSIS — R5383 Other fatigue: Secondary | ICD-10-CM

## 2022-05-15 DIAGNOSIS — Z79899 Other long term (current) drug therapy: Secondary | ICD-10-CM

## 2022-05-15 NOTE — Progress Notes (Signed)
Lab ordered. In person appt scheduled.  Did you want her to do lab before her in person appt?

## 2022-05-15 NOTE — Telephone Encounter (Signed)
Spoke to patient. In person visit is scheduled. Lab ordered.

## 2022-05-21 ENCOUNTER — Ambulatory Visit: Payer: Medicare PPO | Admitting: Internal Medicine

## 2022-05-21 ENCOUNTER — Encounter: Payer: Self-pay | Admitting: Internal Medicine

## 2022-05-21 VITALS — BP 108/60 | HR 80 | Temp 97.9°F | Wt 103.4 lb

## 2022-05-21 DIAGNOSIS — M419 Scoliosis, unspecified: Secondary | ICD-10-CM | POA: Diagnosis not present

## 2022-05-21 DIAGNOSIS — Z23 Encounter for immunization: Secondary | ICD-10-CM

## 2022-05-21 DIAGNOSIS — G8929 Other chronic pain: Secondary | ICD-10-CM

## 2022-05-21 DIAGNOSIS — R269 Unspecified abnormalities of gait and mobility: Secondary | ICD-10-CM | POA: Diagnosis not present

## 2022-05-21 DIAGNOSIS — Z79899 Other long term (current) drug therapy: Secondary | ICD-10-CM

## 2022-05-21 DIAGNOSIS — M21371 Foot drop, right foot: Secondary | ICD-10-CM | POA: Diagnosis not present

## 2022-05-21 DIAGNOSIS — R29898 Other symptoms and signs involving the musculoskeletal system: Secondary | ICD-10-CM | POA: Diagnosis not present

## 2022-05-21 DIAGNOSIS — M5440 Lumbago with sciatica, unspecified side: Secondary | ICD-10-CM

## 2022-05-21 NOTE — Progress Notes (Signed)
Chief Complaint  Patient presents with   OTHER    Patient is here to discuss with provider about non-narcotic medication.     HPI: Kaitlyn Flowers 81 y.o. come in for Chronic disease management   Pain clinic on [address]  on tramadol bid    Seeing PA  hard to access.  And no new ideas  but asking for help.   Tramadol barely touches pain as much  and 50 bid .  Offered butrnas  not sure want to try may have used in past and no help  Tried cymbalta   caused nausea and  made 24 hours nausea .  But didn't get  help with this. Talked with maddie  take at night  might help some. Stopped temporarily for social reasons( 80 b day ) thinking about  restarting. .   Given butrans patch   want helpful  Only time   not hurting when sleeping .Intense at times  .  Exhausting  Pt via [place] team for scoliosis  not that helpful ROS: See pertinent positives and negatives per HPI. Surgery in past with residual foot drop still   back buttocks and like on fire.  And slight better I stands.  Sees  Dr. Ellene Route every 6 mos.  And no current surgery at this time.   PT  piliates a couple times per week.  Walks every day   maximal improvement   recently doing a yoga    non floot  adapted  recent help.   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   Urinary tract infection    Wears hearing aid     Family History  Problem Relation Age of Onset   Stroke Mother        2010   Heart attack Father    Cancer Paternal Grandmother     Social History   Socioeconomic History   Marital status: Widowed    Spouse  name: Not on file   Number of children: Not on file   Years of education: Not on file   Highest education level: Not on file  Occupational History   Not on file  Tobacco Use   Smoking status: Former    Packs/day: 0.25    Years: 2.00    Total pack years: 0.50    Types: Cigarettes    Quit date: 09/03/1967    Years since quitting: 54.7   Smokeless tobacco: Never   Tobacco comments:    quit 48 years   Vaping Use   Vaping Use: Never used  Substance and Sexual Activity   Alcohol use: Yes    Alcohol/week: 4.0 standard drinks of alcohol    Types: 4 Glasses of wine per week    Comment: 4 a week   Drug use: No   Sexual activity: Not Currently    Partners: Male    Birth control/protection: Surgical    Comment: hysterectomy  Other Topics Concern   Not  on file  Social History Narrative   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   Born in [place] calls [place] home in [place] since 1990   Right handed   One story home   Lives alone   Social Determinants of [place] Strain: [place]  (04/29/2022)   Overall Financial Resource Strain (CARDIA)    Difficulty of Paying Living Expenses: Not hard at all  Food Insecurity: No Food Insecurity (04/29/2022)   Hunger Vital Sign    Worried About Running Out of Food in the Last Year: Never true    Ran Out of Food in the Last Year: Never true  Transportation Needs: No Transportation Needs (04/29/2022)   PRAPARE - Hydrologist (Medical): No    Lack of Transportation (Non-Medical): No  Physical Activity: Sufficiently Active (04/29/2022)   Exercise Vital Sign    Days of Exercise per Week: 7 days    Minutes of Exercise per Session: 30 min  Stress: No Stress Concern Present (04/29/2022)   [place]    Feeling of Stress : Not at all  Social Connections: Moderately Integrated (08/14/2020)   Social Connection and Isolation Panel  [NHANES]    Frequency of Communication with Friends and Family: More than three times a week    Frequency of Social Gatherings with Friends and Family: More than three times a week    Attends Religious Services: More than 4 times per year    Active Member of [employer] or Organizations: Yes    Attends Archivist Meetings: More than 4 times per year    Marital Status: Widowed    Outpatient Medications Prior to Visit  Medication Sig Dispense Refill   Acetylcysteine (N-ACETYL-L-CYSTEINE PO) Take 500 mg by mouth daily.     Cholecalciferol (VITAMIN D3) 125 MCG (5000 UT) TABS Take 1 tablet by mouth daily.     cyanocobalamin 1000 MCG tablet Take 1,000 mcg by mouth daily.     GARLIC PO Take 8,841 mg by mouth daily.     lamoTRIgine (LAMICTAL) 25 MG tablet TAKE 1 TABLET(25 MG) BY MOUTH EVERY EVENING 90 tablet 3   MAGNESIUM PO Take 1 tablet by mouth daily.     NONFORMULARY OR COMPOUNDED ITEM Vitamin E vaginal cream 200u/ml.  One ml pv twice weekly.  Disp. 3 month supply. 24 each 3   pyridoxine (B-6) 200 MG tablet Take 200 mg by mouth daily.     traMADol (ULTRAM) 50 MG tablet Take 50 mg by mouth daily.     No facility-administered medications prior to visit.     EXAM:  BP 108/60 (BP Location: Left Arm, Patient Position: Sitting, Cuff Size: Normal)   Pulse 80   Temp 97.9 F (36.6 C) (Oral)   Wt 103 lb 6.4 oz (46.9 kg)   LMP  (LMP Unknown)   SpO2 99%   BMI 20.88 kg/m   Body mass index is 20.88 kg/m.  GENERAL: vitals reviewed and listed above, alert, oriented, appears well hydrated and in no acute distress HEENT: atraumatic, conjunctiva  clear, no obvious abnormalities on inspection of external nose and ears  exudate  Gait r mof foot drop and altered gait from this with cane walking stick  but good balance    nl cognition  PSYCH: pleasant and cooperative, no obvious depression or anxiety  BP Readings from Last 3 Encounters:  05/21/22 108/60  03/22/22 123/70  02/08/22 (!) 93/56  ASSESSMENT AND PLAN:  Discussed the following assessment and plan:  Foot drop, right foot  Gait abnormality  Right leg weakness  Medication management  Chronic bilateral low back pain with sciatica, sciatica laterality unspecified  Influenza vaccine needed - Plan: Flu Vaccine QUAD High Dose(Fluad) On going neuropathic pain underlying scoliosis with some foot drop (  rigid also  have not been practical Reviewed and  plan to restart the low dos cymbalta 20 mg as tolerated.   Would still have pain clinic  do CS management for now.  Consider water  movement . Will get opinion from [facility] dr Abran Duke al to see if can help with gait and movement  that may help her pain agumentation and keep her mobile ( she likes to hike and walk as possible)  -Patient advised to return or notify health care team  if  new concerns arise.  Patient Instructions  Retry  cymbalta  20 and let us know in a  month about how doing.  Try water therapy  again.  Will do referral to dr Abran Duke al to see if they can help with gait and mobility ideas  that could  help.   ( Rgid afos not helpful)              Mariann Laster K. Aison Malveaux M.D.

## 2022-05-21 NOTE — Patient Instructions (Addendum)
Retry  cymbalta  20 and let us know in a  month about how doing.  Try water therapy  again.  Will do referral to dr Abran Duke al to see if they can help with gait and mobility ideas  that could  help.   ( Rigid afos not helpful)

## 2022-05-27 ENCOUNTER — Other Ambulatory Visit: Payer: Self-pay | Admitting: Internal Medicine

## 2022-05-27 ENCOUNTER — Encounter: Payer: Self-pay | Admitting: Internal Medicine

## 2022-05-31 NOTE — Telephone Encounter (Signed)
Dr. Regis Bill patient.   Last OV-05-21-22 Last refill- sent by historical provider.

## 2022-06-03 MED ORDER — TRAMADOL HCL 50 MG PO TABS: 50.0000 mg | ORAL_TABLET | Freq: Every day | ORAL | 0 refills | Status: DC

## 2022-06-13 ENCOUNTER — Ambulatory Visit: Payer: Medicare PPO | Admitting: Internal Medicine

## 2022-06-13 ENCOUNTER — Encounter: Payer: Self-pay | Admitting: Internal Medicine

## 2022-06-13 VITALS — BP 120/80 | HR 71 | Ht 59.0 in | Wt 106.6 lb

## 2022-06-13 DIAGNOSIS — M810 Age-related osteoporosis without current pathological fracture: Secondary | ICD-10-CM | POA: Diagnosis not present

## 2022-06-13 DIAGNOSIS — E871 Hypo-osmolality and hyponatremia: Secondary | ICD-10-CM

## 2022-06-13 DIAGNOSIS — E559 Vitamin D deficiency, unspecified: Secondary | ICD-10-CM | POA: Diagnosis not present

## 2022-06-13 MED ORDER — DENOSUMAB 60 MG/ML ~~LOC~~ SOSY
60.0000 mg | PREFILLED_SYRINGE | Freq: Once | SUBCUTANEOUS | Status: AC
  Administered 2022-06-13: 60 mg via SUBCUTANEOUS

## 2022-06-13 NOTE — Patient Instructions (Signed)
Let's continue Prolia every 6 months.  Continue vitamin D 5000 units daily.  Please come back for a follow-up appointment in 1 year.

## 2022-06-13 NOTE — Addendum Note (Signed)
Addended by: Sarina Ill on: 06/13/2022 02:19 PM   Modules accepted: Orders

## 2022-06-13 NOTE — Progress Notes (Signed)
Patient seen today for Prolia injection.  60mg/ml given in left arm subQ.  Patient tolerated well.  Follow up 6 months or as advised by provider.Patient verified name, DOB and provided verbal consent prior to administration. 

## 2022-06-13 NOTE — Progress Notes (Signed)
Patient ID: Kaitlyn Flowers, female   DOB: 11/04/1940, 81 y.o.   MRN: [id number]  HPI  Kaitlyn Flowers is a 81 y.o.-year-old female, returning for f/u for of hyponatremia and OP. Last visit 1 year ago.  Interim history: No fractures since last visit. She fell x2 in last 6 mo - once in her house and once on a trail.  She is thinking about getting a watch with fall alarms. Her scoliosis continues to progress.  She has significant pain. She is seen in the scoliosis clinic at [facility]. She had a fusion sx at L5-S1 to help with back pain, right leg weakness and foot drop, but this did not help >> was told she have to have 4 vertebrae fused, but wanted a second opinion at [facility] >> no surgery was recommended.  She continues to have R foot drop.  Hyponatremia:  Reviewed history:  Patient had orthostatic syncope on 03/03/2016 and went to the emergency room.  After this, she again returned to ED with difficulty speaking, mild confusion and blurry vision on 03/22/2016.  CT head was negative for stroke and this was assumed to be a TIA or neuro changes from UTI.  However, the sodium level was found to be low, at 123.  MRI/MRA on 06/08/2016 did not show a pituitary mass. It showed chronic microvascular ds.  She started to have UTIs in the beginning of 2017 (Pseudomonas)-initially treated with PICC line IV antibiotics, and then p.o. antibiotics.  She had several recurrences.  She had a urinary catheter which she was using 3 times daily as it was suspected that she could not empty her bladder well.  The frequency of her UTIs decreased, then only cathing once a day. She then had a pessary placed >> not cathing anymore.  She started to drink more water after these episodes to improve her urinary flow, and also as she developed dry mouth.   At the end of 2017, she developed [place] aphasia, confusion, and then verbal aphasia which lasted 30 minutes.  At that time, she saw her PCP.  The sodium returned slightly  low, at 132.  The episodes repeated and she also developed headaches.  She went to the emergency room.  Sodium was lower, at 129.  She subsequently saw Dr. Delice Lesch (neurology).  Of note, she also has severe, chronic, low back pain, with neuropathy left leg.  She tried a spinal cord stimulator but this did not work.  She is seen in the scoliosis clinic at [facility].  Her scoliosis is progressing.  Is and Os: Approximately 1.5 to 2 L a day. (1200 ml water +  240 ml coffee + 240 ml hot water). No headaches or increased thirst.  Reviewed previous sodium levels: Lab Results  Component Value Date   NA 139 03/22/2022   NA 138 05/03/2021   NA 136 03/30/2020   NA 136 09/29/2019   NA 131 (L) 09/19/2019   NA 127 (L) 09/18/2019   NA 134 (L) 08/16/2019   NA 135 05/13/2019   NA 136 07/22/2018   NA 137 04/29/2018   NA 132 (L) 09/26/2017   NA 135 06/24/2017   NA 133 (A) 02/19/2017   NA 133 (L) 12/23/2016   NA 129 (L) 08/09/2016   Reviewed other pertinent labs from 04/2016: Osmolality     278 - 305 mOsm/kg 271 (L)  Cortisol, Plasma     ug/dL 10.2  C206 ACTH     6 - 50 pg/mL 16  Uric Acid,  Serum     2.4 - 7.0 mg/dL 2.7  Osmolality, Urine     50 - 1,200 mOsm/kg 304  Potassium Urine     12 - 129 mmol/L 28  Sodium, Urine     28 - 272 mmol/L 77  Labs pointed towards improving SIADH/water overload without appropriate renal water wasting.    No history of adrenal insufficiency, hypothyroidism, hypertriglyceridemia.  OP: Reviewed history: She was diagnosed with osteoporosis in 2015.  She had a right ankle fracture rollerskating.  She denies dizziness/vertigo/orthostasis.  Reviewed her previous DXA scan reports: 09/26/2020 ([place]) Lumbar spine L1-L4 (L2) Femoral neck (FN)  T-score +0.2 RFN: -2.5 LFN: -2.5  Change in BMD from previous DXA test (%) +13%* +2.7%  (*) statistically significant  01/08/2018 ([facility]) Lumbar spine L1-L4 (L2) Femoral neck (FN)  T-score -0.1 RFN: -2.7 LFN: -2.5   Change in BMD from previous DXA test (%) +11.1%* -1.0%  (*) statistically significant  Date L1-L4 T score FN T score  12/10/2013 -1.7 RFN: -2.7 LFN: -2.5   L1-L4 decreased by 5.8% from 2012 Total mean hip BMD decreased 7.7%* from 2012    01/08/2016 Lumbar spine (L1-L4) Femoral neck (FN)  T-score - 1.9  RFN: - 2.5 LFN: - 2.7  Change in BMD from previous DXA test (%) - 1.9%  + 0.4%   (*) statistically significant  Previous treatments: - I suggested Fosamax weekly but she refused in 03/2016 - we started Prolia 09/03/2018.  Subsequent doses: 03/26/2019, 11/30/2019, 09/12/2020, 03/13/2021, 10/10/2021. She tolerates this well.  No history of persistent hyper or hypercalcemia, no history of kidney stones. Lab Results  Component Value Date   PTH 59 02/13/2016   PTH 66 (H) 07/11/2014   CALCIUM 9.4 03/22/2022   CALCIUM 9.5 05/03/2021   CALCIUM 8.9 03/30/2020   CALCIUM 9.4 09/29/2019   CALCIUM 7.3 (L) 09/19/2019   CALCIUM 7.6 (L) 09/18/2019   CALCIUM 9.6 08/16/2019   CALCIUM 9.0 05/13/2019   CALCIUM 9.8 07/22/2018   CALCIUM 10.2 04/29/2018   No history of thyrotoxicosis: Lab Results  Component Value Date   TSH 1.42 03/22/2022   TSH 0.83 09/26/2017   TSH 1.28 02/13/2016   TSH 1.21 07/11/2014   She does have a history of vitamin D deficiency.  At last visit she was taking approximately 3000 to 5000 units of vitamin D daily.  I advised her to increase the dose to 5000 units daily.  Vitamin D levels: Lab Results  Component Value Date   VD25OH 26.6 (L) 05/03/2021   VD25OH 46.63 04/29/2018   VD25OH 78.21 12/23/2016   VD25OH 64.30 07/22/2016   VD25OH 55.43 07/09/2016   VD25OH 94.31 04/02/2016   VD25OH 60.65 02/10/2014   She was on calcium citrate >> now off.  No CKD. Last BUN/Cr: Lab Results  Component Value Date   BUN 28 (H) 03/22/2022   CREATININE 0.82 03/22/2022   She does Pilates for exercise (including weight bearing exercises). Also in PT now for weakness in L leg.    She has a family history of osteoporosis in mother/sisters.  Mother had kyphosis.  Menopause was at 42 years old surgical TAH + BSO.  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 She has degenerative scoliosis, seen at [facility]. She has a history of frequent UTIs, on nitrofurantoin.  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   Urinary tract infection    Wears hearing aid    Past Surgical History:  Procedure Laterality Date   ABDOMINAL EXPOSURE N/A 05/17/2019   Procedure: ABDOMINAL EXPOSURE;  Surgeon: Angelia Mould, MD;  Location: [facility];  Service: Vascular;  Laterality: N/A;   ABDOMINAL HYSTERECTOMY  1973   1/2 of ovary left cyst on ovary   ANTERIOR LUMBAR FUSION N/A 05/17/2019   Procedure: Lumbar five Sacral one  Anterior lumbar interbody fusion;  Surgeon: Kristeen Miss, MD;  Location: [facility];  Service: Neurosurgery;  Laterality: N/A;   APPENDECTOMY     removed with hysterectomy   COLONOSCOPY     LUMBAR WOUND DEBRIDEMENT  12/06/2011   Procedure: LUMBAR WOUND DEBRIDEMENT;  Surgeon: Kristeen Miss, MD;  Location: [facility];  Service: Neurosurgery;  Laterality: N/A;  Repair of Pseudomeningocele Lumbar wound   ROTATOR CUFF REPAIR     left    shoulder rotator culff  Left 2006   spinal cord tumor  1987   2012 october    History   Social History   Marital Status: married    Spouse Name: N/A    Number of Children: 2   Occupational History   Optometrist   Social History Main Topics   Smoking  status: Former Smoker -- 0.25 packs/day for 2 years    Types: Cigarettes    Quit date: 09/03/1967   Smokeless tobacco: Not on file   Alcohol Use: 2.4 oz/week    4-5 Glasses of wine per week   Drug Use: No   Social History Narrative   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     Cholecalciferol (VITAMIN D3) 125 MCG (5000 UT) TABS Take 1 tablet by mouth daily.     cyanocobalamin 1000 MCG tablet Take 1,000 mcg by mouth daily.     GARLIC PO Take 7,106 mg by mouth daily.     lamoTRIgine (LAMICTAL) 25 MG tablet TAKE 1 TABLET(25 MG) BY MOUTH EVERY EVENING 90 tablet 3   MAGNESIUM PO Take 1 tablet by mouth daily.     NONFORMULARY OR COMPOUNDED ITEM Vitamin E vaginal cream 200u/ml.  One ml pv twice weekly.  Disp. 3 month supply. 24 each 3   pyridoxine (B-6) 200 MG tablet Take 200 mg by mouth daily.     traMADol (ULTRAM) 50 MG tablet Take 1 tablet (50 mg total) by mouth daily. 30 tablet 0   No current facility-administered medications on file prior to visit.   Allergies  Allergen Reactions   Ibuprofen-Famotidine Other (See Comments)    Elevated BP   Fentanyl Other (See Comments)    Constipates for about 2 weeks   Pregabalin Swelling    AMS, Swelling   Penicillins Rash    Did it involve swelling of the face/tongue/throat,  SOB, or low BP? No Did it involve sudden or severe rash/hives, skin peeling, or any reaction on the inside of your mouth or nose? No Did you need to seek medical attention at a hospital or doctor's office? No When did it last happen?      30+ years If all above answers are "NO", may proceed with cephalosporin use.    Family History  Problem Relation Age of Onset   Stroke Mother        2010   Heart attack Father    Cancer Paternal  Grandmother    PE: BP 120/80 (BP Location: Right Arm, Patient Position: Sitting, Cuff Size: Normal)   Pulse 71   Ht '4\' 11"'$  (1.499 m)   Wt 106 lb 9.6 oz (48.4 kg)   LMP  (LMP Unknown)   SpO2 99%   BMI 21.53 kg/m  Wt Readings from Last 3 Encounters:  06/13/22 106 lb 9.6 oz (48.4 kg)  05/21/22 103 lb 6.4 oz (46.9 kg)  04/29/22 102 lb (46.3 kg)   Constitutional: normal weight, in NAD Eyes: PERRLA, EOMI, no exophthalmos ENT: moist mucous membranes, no thyromegaly, no cervical lymphadenopathy Cardiovascular: RRR, No MRG Respiratory: CTA B Gastrointestinal: abdomen soft, NT, ND, BS+ Musculoskeletal: + deformities: Significant scoliosis, strength intact in all 4 Skin: moist, warm, no rashes Neurological: no tremor with outstretched hands, DTR normal in all 4  Assessment: 1.  Hyponatremia  2.  Osteoporosis  Plan: 1. Hyponatremia -Patient developed hyponatremia in 03/2016.  She initially presented with orthostatic syncope and difficulty speaking, mild confusion, blurry vision, in the setting of a UTI.  Head CT and MRI were normal and her symptoms resolved.  Sodium was found to be 123.  Subsequent sodium levels have been improving to slightly low levels, before last visit, 132.  However, since then, her sodium levels normalized and the latest level reviewed from 05/31/2020 was 136. -Prior investigation pointed towards either SIADH or increased water intake that overcomes renal mechanisms of urine dilution (I suspect the latter). -Note, she does have a history of seizures and is on Lamictal.  No recent episodes. -We continue to manage her with fluid restrictions (intended approximately 1.5 L a day, but realistically she is getting approximately 1.5-2 L a day) -We discussed about salt tablets to use in the past, but she does have labile blood pressure and I did not feel this was a good option for her -Latest sodium was excellent, at 139 in 03/2022  2. Osteoporosis -Patient does not have a  history of falls or fractures since last visit -She continues on Prolia without side effects: No jaw/thigh/hip pain -However, she did not take the last injection in 04/2022 -we discussed that delaying the Prolia dose by more than 6 to 7 months is predisposing her to fast decrease in bone density and high risk of fractures -She agrees to continue with Prolia -Reviewed her previous DEXA scan report: in 01/08/2018 when her femoral scores were still in the osteoporosis range but fairly stable from the bone density from 2017 and the spine T score was slightly higher.  However, L2 vertebral was excluded from analysis due to degenerative changes in 2019 compared to the scan from 2017, in which all vertebrae were analyzed.  She had another bone density scan 09/26/2020: Lumbar spine bone density increased significantly, while it was stable at the level of the femoral necks. -Her scoliosis is worsening - on the lumbar CT from 10/2020, showed significant progress of scoliosis since 2018 -Of  note, she also has a history of meningioma resected from her thoracic spine, followed by skeletal radiotherapy.  This would contraindicate the use of teriparatide or abaloparatide due to increased risk of osteosarcoma.  This would have been ideal for her since she also possibly needed spine surgery, however, she does not do spine surgery not considered now due to concern of how she would tolerate the surgery. -Currently tolerating well Prolia, so we will continue with this -I encouraged her to do weightbearing exercises.  She does Pilates, yoga and walking. -Latest BMP was normal.  She does need a vitamin D level checked -this was ordered by PCP -patient will have it done in our office. -We discussed in the past about getting 1200 mg of calcium daily in her diet -she is having a good diet including almond milk, greens, legumes, and nuts.  I advised her if she is not getting this amt, to supplement calcium tablets. -Plan repeat bone  density next year -I will see her back in 1 year  Philemon Kingdom, MD PhD [facility] Endocrinology

## 2022-06-16 ENCOUNTER — Encounter: Payer: Self-pay | Admitting: Internal Medicine

## 2022-06-16 NOTE — Telephone Encounter (Signed)
So Im back in office     And Dr Sarajane Jews filled th tramadol   And I see that you saw Dr Cruzita Lederer about osteo[porosis management   I think we had discussed you trying the cymbalta that was given to you by the pain management prescribe.     I advise  we get a fu appt v(irtual ok) visit  in 2-4 weeks to clarify status of meds, pain etc.  And go from there .

## 2022-06-18 NOTE — Telephone Encounter (Signed)
Last OV 05/21/22 with PCP notes: Tramadol barely touches pain as much  and 50 bid .  Offered butrnas  not sure want to try may have used in past and no help

## 2022-06-19 ENCOUNTER — Telehealth (INDEPENDENT_AMBULATORY_CARE_PROVIDER_SITE_OTHER): Payer: Medicare PPO | Admitting: Internal Medicine

## 2022-06-19 ENCOUNTER — Encounter: Payer: Self-pay | Admitting: Internal Medicine

## 2022-06-19 ENCOUNTER — Telehealth: Payer: Self-pay

## 2022-06-19 VITALS — Ht 60.0 in | Wt 103.0 lb

## 2022-06-19 DIAGNOSIS — M5440 Lumbago with sciatica, unspecified side: Secondary | ICD-10-CM

## 2022-06-19 DIAGNOSIS — Z79899 Other long term (current) drug therapy: Secondary | ICD-10-CM

## 2022-06-19 DIAGNOSIS — G8929 Other chronic pain: Secondary | ICD-10-CM

## 2022-06-19 DIAGNOSIS — M419 Scoliosis, unspecified: Secondary | ICD-10-CM | POA: Diagnosis not present

## 2022-06-19 DIAGNOSIS — M5442 Lumbago with sciatica, left side: Secondary | ICD-10-CM

## 2022-06-19 DIAGNOSIS — R52 Pain, unspecified: Secondary | ICD-10-CM

## 2022-06-19 MED ORDER — DULOXETINE HCL 30 MG PO CPEP: 30.0000 mg | ORAL_CAPSULE | Freq: Every day | ORAL | 3 refills | Status: DC

## 2022-06-19 MED ORDER — TRAMADOL HCL 50 MG PO TABS: 50.0000 mg | ORAL_TABLET | Freq: Two times a day (BID) | ORAL | 0 refills | Status: DC | PRN

## 2022-06-19 NOTE — Patient Outreach (Signed)
  Care Coordination   06/19/2022 Name: Kaitlyn Flowers MRN: [id number] DOB: 1941/08/17   Care Coordination Outreach Attempts:  An unsuccessful telephone outreach was attempted today to offer the patient information about available care coordination services as a benefit of their health plan.   Follow Up Plan:  Additional outreach attempts will be made to offer the patient care coordination information and services.   Encounter Outcome:  No Answer  Care Coordination Interventions Activated:  No   Care Coordination Interventions:  No, not indicated    Peter Garter RN, BSN,CCM, [place] [facility] [phone]

## 2022-06-19 NOTE — Progress Notes (Signed)
Virtual Visit via Video Note  I connected with Kaitlyn Flowers on 06/19/22 at  4:00 PM EDT by a video enabled telemedicine application and verified that I am speaking with the correct person using two identifiers. Location patient: home Location provider:work office Persons participating in the virtual visit: patient, provider  Patient aware  of the limitations of evaluation and management by telemedicine and  availability of in person appointments. and agreed to proceed.   HPI: Kaitlyn Flowers presents for video  Fu with medication.  See last visiit  having [place] try to help with pain management with in scope of practice and Disc Cymbalta   Tried Cymbalta 20 mg again about 3-4 weeks.  Not sure was  but pain got much worse after off 3-4 days .So may be was helping a good bit Asks to try cymbalta 30 mg  Some constipation  managing with magnesium  and greens  Dr Sarajane Jews refillled the tramadol  in my absence using bid  asks about  refill at this time   ROS: See pertinent positives and negatives per HPI.  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   Urinary tract infection    Wears hearing aid     Past Surgical History:  Procedure Laterality Date   ABDOMINAL EXPOSURE N/A 05/17/2019   Procedure: ABDOMINAL EXPOSURE;  Surgeon: Angelia Mould, MD;  Location: [facility];  Service: Vascular;  Laterality: N/A;   ABDOMINAL HYSTERECTOMY  1973   1/2 of ovary left cyst on ovary   ANTERIOR LUMBAR FUSION N/A 05/17/2019   Procedure: Lumbar five Sacral one  Anterior lumbar interbody fusion;  Surgeon: Kristeen Miss, MD;  Location: [facility];  Service: Neurosurgery;  Laterality: N/A;   APPENDECTOMY     removed with hysterectomy   COLONOSCOPY     LUMBAR WOUND DEBRIDEMENT  12/06/2011   Procedure: LUMBAR WOUND DEBRIDEMENT;  Surgeon: Kristeen Miss, MD;  Location: [facility];  Service: Neurosurgery;  Laterality: N/A;  Repair of Pseudomeningocele Lumbar wound   ROTATOR CUFF REPAIR     left    shoulder rotator culff  Left 2006   spinal cord tumor  1987   2012 october     Family History  Problem Relation Age of Onset   Stroke Mother        2010   Heart attack Father    Cancer Paternal Grandmother     Social History   Tobacco Use   Smoking status: Former    Packs/day: 0.25    Years: 2.00    Total pack years: 0.50    Types: Cigarettes    Quit date: 09/03/1967    Years since quitting: 54.8   Smokeless tobacco: Never   Tobacco comments:    quit 48 years   Vaping Use   Vaping Use: Never used  Substance Use Topics   Alcohol use: Yes    Alcohol/week: 4.0 standard drinks of alcohol    Types: 4 Glasses of wine per  week    Comment: 4 a week   Drug use: No      Current Outpatient Medications:    Cholecalciferol (VITAMIN D3) 125 MCG (5000 UT) TABS, Take 1 tablet by mouth daily., Disp: , Rfl:    cyanocobalamin 1000 MCG tablet, Take 1,000 mcg by mouth daily., Disp: , Rfl:    DULoxetine (CYMBALTA) 30 MG capsule, Take 1 capsule (30 mg total) by mouth daily. Increase dosage, Disp: 30 capsule, Rfl: 3   GARLIC PO, Take 1,027 mg by mouth daily., Disp: , Rfl:    lamoTRIgine (LAMICTAL) 25 MG tablet, TAKE 1 TABLET(25 MG) BY MOUTH EVERY EVENING, Disp: 90 tablet, Rfl: 3   MAGNESIUM PO, Take 1 tablet by mouth daily., Disp: , Rfl:    NONFORMULARY OR COMPOUNDED ITEM, Vitamin E vaginal cream 200u/ml.  One ml pv twice weekly.  Disp. 3 month supply., Disp: 24 each, Rfl: 3   pyridoxine (B-6) 200 MG tablet, Take 200 mg by mouth daily., Disp: , Rfl:    Acetylcysteine (N-ACETYL-L-CYSTEINE PO), Take 500 mg by mouth  daily. (Patient not taking: Reported on 06/19/2022), Disp: , Rfl:    traMADol (ULTRAM) 50 MG tablet, Take 1 tablet (50 mg total) by mouth 2 (two) times daily as needed., Disp: 60 tablet, Rfl: 0  EXAM: BP Readings from Last 3 Encounters:  06/13/22 120/80  05/21/22 108/60  03/22/22 123/70    VITALS per patient if applicable:  GENERAL: alert, oriented, appears well and in no acute distress  HEENT: atraumatic, conjunttiva clear, no obvious abnormalities on inspection of external nose and ears  NECK: normal movements of the head and neck  LUNGS: on inspection no signs of respiratory distress, breathing rate appears normal, no obvious gross SOB, gasping or wheezing  CV: no obvious cyanosis  MS: moves all visible extremities without noticeable abnormality  PSYCH/NEURO: pleasant and cooperative, no obvious depression or anxiety, speech and thought processing grossly intact Lab Results  Component Value Date   WBC 8.1 03/22/2022   HGB 11.0 (L) 03/22/2022   HCT 32.5 (L) 03/22/2022   PLT 216.0 03/22/2022   GLUCOSE 73 03/22/2022   CHOL 224 (H) 03/22/2022   TRIG 60.0 03/22/2022   HDL 88.70 03/22/2022   LDLDIRECT 110.1 11/27/2010   LDLCALC 124 (H) 03/22/2022   ALT 19 03/22/2022   AST 24 03/22/2022   NA 139 03/22/2022   K 4.8 03/22/2022   CL 104 03/22/2022   CREATININE 0.82 03/22/2022   BUN 28 (H) 03/22/2022   CO2 28 03/22/2022   TSH 1.42 03/22/2022   INR 1.01 03/25/2016   HGBA1C 5.7 03/22/2022    ASSESSMENT AND PLAN:  Discussed the following assessment and plan:    ICD-10-CM   1. Medication management  Z79.899     2. Pain management  R52     3. Pain  R52     4. Scoliosis, unspecified scoliosis type, unspecified spinal region  M41.9     5. Chronic bilateral low back pain with sciatica, sciatica laterality unspecified  M54.40    G89.29      We will try 30 mg Cymbalta discussed measures to counteract any potential constipation side effect She is taking the tramadol  50 mg twice a day as needed most days Dr. Sarajane Jews sent in the last prescription We will send in #60 today to be used as needed.  She has not had side effects reported. Plan follow-up in 4 to 6 weeks about med effect.  Whether to continue.  Same  dosing Counseled.   Expectant management and discussion of plan and treatment with opportunity to ask questions and all were answered. The patient agreed with the plan and demonstrated an understanding of the instructions.   Advised to call back or seek an in-person evaluation if worsening  or having  further concerns  in interim. Return for 4-6 weeks med check.    Shanon Ace, MD

## 2022-06-21 ENCOUNTER — Encounter: Payer: Self-pay | Admitting: Obstetrics and Gynecology

## 2022-06-21 ENCOUNTER — Ambulatory Visit (INDEPENDENT_AMBULATORY_CARE_PROVIDER_SITE_OTHER): Payer: Medicare PPO | Admitting: Obstetrics and Gynecology

## 2022-06-21 VITALS — BP 104/66 | HR 81 | Ht 60.0 in | Wt 103.0 lb

## 2022-06-21 DIAGNOSIS — N8110 Cystocele, unspecified: Secondary | ICD-10-CM

## 2022-06-21 DIAGNOSIS — N393 Stress incontinence (female) (male): Secondary | ICD-10-CM

## 2022-06-21 NOTE — Progress Notes (Signed)
[facility]   Subjective:     Chief Complaint:  Chief Complaint  Patient presents with   Pessary Check   History of Present Illness: Kaitlyn Flowers is a 81 y.o. female with stage II pelvic organ prolapse and stress incontinence who presents for a pessary check. She is using a size 2-1/4in incontinence ring with support pessary. The pessary has been working well and she has no complaints. She denies vaginal bleeding. Working very well for her leakage.   Past Medical History: Patient  has a past medical history of Anemia, Arthritis, Back pain, Cancer ([place]), Depression, Leg weakness, Osteopenia, PONV (postoperative nausea and vomiting), Right leg pain, Seizures ([place]), Spinal cord tumor, Urinary tract infection, and Wears hearing aid.   Past Surgical History: She  has a past surgical history that includes Abdominal hysterectomy (1973); shoulder rotator culff  (Left, 2006); spinal cord tumor (1987); Appendectomy; Rotator cuff repair; Lumbar wound debridement (12/06/2011); Colonoscopy; Anterior lumbar fusion (N/A, 05/17/2019); and Abdominal exposure (N/A, 05/17/2019).   Medications: She has a current medication list which includes the following prescription(s): acetylcysteine, vitamin d3, cyanocobalamin, duloxetine, garlic, lamotrigine, magnesium, NONFORMULARY OR COMPOUNDED ITEM, pyridoxine, and tramadol.   Allergies: Patient is allergic to ibuprofen-famotidine, fentanyl, pregabalin, and penicillins.   Social History: Patient  reports that she quit smoking about 54 years ago. Her smoking use included cigarettes. She has a 0.50 pack-year smoking history. She has never used smokeless tobacco. She reports current alcohol use of about 4.0 standard drinks of alcohol per week. She reports that she does not use drugs.      Objective:    Physical Exam: BP 104/66 (BP Location: Right Arm, Patient Position: Sitting, Cuff Size: Normal)   Pulse 81   Ht 5' (1.524 m)   Wt 103 lb (46.7  kg)   LMP  (LMP Unknown)   SpO2 98%   BMI 20.12 kg/m  Gen: No apparent distress, A&O x 3.  Detailed Urogynecologic Evaluation:  Pelvic Exam: Normal external female genitalia; Bartholin's and Skene's glands normal in appearance; urethral meatus normal in appearance, no urethral masses or discharge. The pessary was noted to be in place. It was removed and cleaned. Speculum exam revealed no lesions in the vagina. The pessary was replaced. It was comfortable to the patient and fit well.   POP-Q (11/12/21):    POP-Q   -1                                            Aa   -1                                           Ba   -8                                              C    2.5                                            Gh   4  Pb   9                                            tvl    -2                                            Ap   -2                                            Bp                                                  D       Assessment/Plan:    Assessment: Kaitlyn Flowers is a 81 y.o. with stage III pelvic organ prolapse and stress incontinence here for a pessary check. She is doing well.  Plan: - size 2-1/4in incontinence ring with support pessary. She will keep the pessary in place until next visit.  - She will follow-up in 3 months for a pessary check or sooner as needed.  All questions were answered.  Jaquita Folds, MD  Time spent: I spent 15 minutes dedicated to the care of this patient on the date of this encounter to include pre-visit review of records, face-to-face time with the patient and post visit documentation.

## 2022-06-24 ENCOUNTER — Encounter: Payer: Self-pay | Admitting: *Deleted

## 2022-06-26 ENCOUNTER — Telehealth: Payer: Self-pay

## 2022-06-26 NOTE — Patient Outreach (Signed)
  Care Coordination   06/26/2022 Name: Kaitlyn Flowers MRN: [id number] DOB: [birth year]   Care Coordination Outreach Attempts:  A second unsuccessful outreach was attempted today to offer the patient with information about available care coordination services as a benefit of their health plan.     Follow Up Plan:  Additional outreach attempts will be made to offer the patient care coordination information and services.   Encounter Outcome:  No Answer  Care Coordination Interventions Activated:  No   Care Coordination Interventions:  No, not indicated    Peter Garter RN, BSN,CCM, [facility] [phone]

## 2022-06-28 NOTE — Telephone Encounter (Signed)
Spoke to patient. She pick up another dose on 06/19/2022.  Scheduled a med check appt for cymbalt on 07/24/2022. No further action needed.

## 2022-07-02 ENCOUNTER — Ambulatory Visit: Payer: Medicare PPO | Admitting: Sports Medicine

## 2022-07-02 DIAGNOSIS — M21371 Foot drop, right foot: Secondary | ICD-10-CM

## 2022-07-02 NOTE — Patient Instructions (Signed)
For your foot drop today we will try a reverse lift in your tennis shoe.  You will have a spare to place in a second pair of shoes.  Wear this reverse left at all times.  You should also work on hip abduction strength and hip rotation at your Pilates class.  Outside of Pilates your hip rehab exercises should be done as much as possible.  This will help strengthen and also relieve some of your pain.  We will follow-up with you in clinic in 4 to 6 weeks.

## 2022-07-02 NOTE — Assessment & Plan Note (Signed)
Fortunately she does not have a complete right foot drop.  We will try today for conservative interventions.  Reverse wedge was placed at the toe of right shoe insert.  She had some improvement in her gait with this intervention.  She should try this for the next 3-4 weeks.  If this does not seem to help we can consider further intervention such as custom fitted ASO.  We can discuss this at follow-up visit if needed. She is given a handout for home exercises to increase strength with hip abduction and range of motion.  She should perform these exercises a couple times a day to increase strength.I have also written this down for her to take to her Pilates instructor for further modifications during Pilates classes.  Will follow up in 4-6 weeks.

## 2022-07-02 NOTE — Progress Notes (Signed)
   New Patient Office Visit  Subjective   Patient ID: Kaitlyn Flowers, female    DOB: 02-14-1941  Age: 81 y.o. MRN: [id number]  Foot drop.  Kaitlyn Flowers presents today with chief complaint of foot drop.  She has had this right foot drop for approximately 5 years.she believes the foot drop is stemming from some low back issue is she is suffering from.  She underwent fusion of L5-S1 with no improvement of her symptoms.  Recently her foot drop has been causing her to trip more frequently when she has her concerned.  She has tried a posterior AFO in the past with minimal correction.  She also has AFO that she secures on the outside of her shoe while driving that works very well.  She normally ambulates with a cane.  She is also tried several rounds of physical therapy with no improvement in her symptoms.   ROS as listed above in HPI    Objective:     BP 122/66   Ht 5' (1.524 m)   Wt 100 lb (45.4 kg)   LMP  (LMP Unknown)   BMI 19.53 kg/m   Physical Exam Vitals reviewed.  Constitutional:      General: She is not in acute distress.    Appearance: Normal appearance. She is not ill-appearing, toxic-appearing or diaphoretic.  Pulmonary:     Effort: Pulmonary effort is normal.  Neurological:     Mental Status: She is alert.   Right foot: No obvious deformity or asymmetry at the ankle. She does have hallux valgus deformity with bunionette deformity of the first toe. No tenderness to palpation at the ankle or foot. Decreased range of motion with active dorsi flexion.full range of motion with plantarflexion and inversion and eversion.  Strength 3/5 with resisted dorsi flexion and abduction.  Strength 4/5 with plantar flexion.  Gait analysis, foot drop noted with hip hiking on the right side.  Physical exam by Dr. Stefanie Libel, decreased right hip rotation as well as 3 out of 5 strength with resisted hip abduction    Assessment & Plan:   Problem List Items Addressed This Visit        Other   Foot drop, right foot    Fortunately she does not have a complete right foot drop.  We will try today for conservative interventions.  Reverse wedge was placed at the toe of right shoe insert.  She had some improvement in her gait with this intervention.  She should try this for the next 3-4 weeks.  If this does not seem to help we can consider further intervention such as custom fitted ASO.  We can discuss this at follow-up visit if needed. She is given a handout for home exercises to increase strength with hip abduction and range of motion.  She should perform these exercises a couple times a day to increase strength.I have also written this down for her to take to her Pilates instructor for further modifications during Pilates classes.  Will follow up in 4-6 weeks.       Return in about 4 weeks (around 07/30/2022).    Elmore Guise, DO  I observed and examined the patient with the [facility] resident and agree with assessment and plan.  Note reviewed and modified by me. Ila Mcgill, MD

## 2022-07-16 ENCOUNTER — Telehealth: Payer: Self-pay

## 2022-07-16 ENCOUNTER — Telehealth (INDEPENDENT_AMBULATORY_CARE_PROVIDER_SITE_OTHER): Payer: Medicare PPO | Admitting: Internal Medicine

## 2022-07-16 ENCOUNTER — Encounter: Payer: Self-pay | Admitting: Internal Medicine

## 2022-07-16 VITALS — Ht 60.0 in | Wt 100.0 lb

## 2022-07-16 DIAGNOSIS — M792 Neuralgia and neuritis, unspecified: Secondary | ICD-10-CM | POA: Diagnosis not present

## 2022-07-16 DIAGNOSIS — R29898 Other symptoms and signs involving the musculoskeletal system: Secondary | ICD-10-CM | POA: Diagnosis not present

## 2022-07-16 DIAGNOSIS — M48062 Spinal stenosis, lumbar region with neurogenic claudication: Secondary | ICD-10-CM | POA: Diagnosis not present

## 2022-07-16 DIAGNOSIS — Z79899 Other long term (current) drug therapy: Secondary | ICD-10-CM

## 2022-07-16 DIAGNOSIS — M21371 Foot drop, right foot: Secondary | ICD-10-CM

## 2022-07-16 NOTE — Progress Notes (Signed)
Virtual Visit via Video Note  I connected with Kaitlyn Flowers on '23 11 14  '$ at  2:00 PM EST by a video enabled telemedicine application and verified that I am speaking with the correct person using two identifiers. Location patient: home Location provider:work office Persons participating in the virtual visit: patient, provider  Patient aware  of the limitations of evaluation and management by telemedicine and  availability of in person appointments. and agreed to proceed.   HPI: Kaitlyn Flowers presents for video visit Has a new pain for a few weeks without cause   feels like multiple bee stings   to upper legs  no new weakness falling  Cymbalta for other pain dosent seem to help so stopped ; doesn't think had withdrawal sx  but has had some fine tremors hands?   Saw Dr Oneida Alar and is doing much better with gait  and adjustment   has fu  11 28  Sees dr Eustace Pen for othe problem  asks if would help to get herto assess this new type pain.  No change bowel or bladder  in interim   ROS: See pertinent positives and negatives per HPI.  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   Urinary tract infection    Wears hearing aid     Past Surgical History:  Procedure Laterality Date   ABDOMINAL EXPOSURE N/A 05/17/2019   Procedure: ABDOMINAL EXPOSURE;  Surgeon: Angelia Mould, MD;  Location: [facility];  Service: Vascular;  Laterality: N/A;   ABDOMINAL HYSTERECTOMY  1973   1/2 of ovary left cyst on ovary   ANTERIOR LUMBAR FUSION N/A 05/17/2019   Procedure: Lumbar five Sacral one  Anterior lumbar  interbody fusion;  Surgeon: Kristeen Miss, MD;  Location: [facility];  Service: Neurosurgery;  Laterality: N/A;   APPENDECTOMY     removed with hysterectomy   COLONOSCOPY     LUMBAR WOUND DEBRIDEMENT  12/06/2011   Procedure: LUMBAR WOUND DEBRIDEMENT;  Surgeon: Kristeen Miss, MD;  Location: [facility];  Service: Neurosurgery;  Laterality: N/A;  Repair of Pseudomeningocele Lumbar wound   ROTATOR CUFF REPAIR     left    shoulder rotator culff  Left 2006   spinal cord tumor  1987   2012 october     Family History  Problem Relation Age of Onset   Stroke Mother        2010   Heart attack Father    Cancer Paternal Grandmother     Social History   Tobacco Use   Smoking status: Former    Packs/day: 0.25    Years: 2.00    Total pack years: 0.50    Types: Cigarettes    Quit date: 09/03/1967    Years since quitting: 54.9   Smokeless tobacco: Never   Tobacco comments:    quit 48 years   Vaping Use   Vaping Use: Never used  Substance Use Topics   Alcohol use: Yes    Alcohol/week:  4.0 standard drinks of alcohol    Types: 4 Glasses of wine per week    Comment: 4 a week   Drug use: No      Current Outpatient Medications:    Acetylcysteine (N-ACETYL-L-CYSTEINE PO), Take 500 mg by mouth daily., Disp: , Rfl:    Cholecalciferol (VITAMIN D3) 125 MCG (5000 UT) TABS, Take 1 tablet by mouth daily., Disp: , Rfl:    cyanocobalamin 1000 MCG tablet, Take 1,000 mcg by mouth daily., Disp: , Rfl:    GARLIC PO, Take 5,364 mg by mouth daily., Disp: , Rfl:    lamoTRIgine (LAMICTAL) 25 MG tablet, TAKE 1 TABLET(25 MG) BY MOUTH EVERY EVENING, Disp: 90 tablet, Rfl: 3   MAGNESIUM PO, Take 1 tablet by mouth daily., Disp: , Rfl:    NONFORMULARY OR COMPOUNDED ITEM, Vitamin E vaginal cream 200u/ml.  One ml pv twice weekly.  Disp. 3 month supply., Disp: 24 each, Rfl: 3   pyridoxine (B-6) 200 MG tablet, Take 200 mg by mouth daily., Disp: , Rfl:    traMADol (ULTRAM) 50 MG tablet, Take 1 tablet (50 mg total) by mouth  2 (two) times daily as needed., Disp: 60 tablet, Rfl: 0  EXAM: BP Readings from Last 3 Encounters:  07/02/22 122/66  06/21/22 104/66  06/13/22 120/80    VITALS per patient if applicable:  GENERAL: alert, oriented, appears well and in no acute distress  HEENT: atraumatic, conjunttiva clear, no obvious abnormalities on inspection of external nose and ears  NECK: normal movements of the head and neck  LUNGS: on inspection no signs of respiratory distress, breathing rate appears normal, no obvious gross SOB, gasping or wheezing  CV: no obvious cyanosis  PSYCH/NEURO: pleasant and cooperative, no obvious depression or anxiety, speech and thought processing grossly intact Lab Results  Component Value Date   WBC 8.1 03/22/2022   HGB 11.0 (L) 03/22/2022   HCT 32.5 (L) 03/22/2022   PLT 216.0 03/22/2022   GLUCOSE 73 03/22/2022   CHOL 224 (H) 03/22/2022   TRIG 60.0 03/22/2022   HDL 88.70 03/22/2022   LDLDIRECT 110.1 11/27/2010   LDLCALC 124 (H) 03/22/2022   ALT 19 03/22/2022   AST 24 03/22/2022   NA 139 03/22/2022   K 4.8 03/22/2022   CL 104 03/22/2022   CREATININE 0.82 03/22/2022   BUN 28 (H) 03/22/2022   CO2 28 03/22/2022   TSH 1.42 03/22/2022   INR 1.01 03/25/2016   HGBA1C 5.7 03/22/2022    ASSESSMENT AND PLAN:  Discussed the following assessment and plan:    ICD-10-CM   1. Nerve pain  M79.2 Ambulatory referral to Neurology   recent  onset  bilatterall but no bowel baldder new weakness sx  and no trauma( not her typcial spinal stenosis pain)    2. Foot drop, right foot  M21.371     3. Medication management  Z79.899 Ambulatory referral to Neurology    4. Right leg weakness  R29.898     5. Spinal stenosis, lumbar region, with neurogenic claudication  M48.062      Stopped cymbalta s ( says no help . ) Refer to Dr Delice Lesch for evaluation of new pain sounding neurologic   Fu dr Oneida Alar    gait feels better since adjusting foot wear from 10 31  Counseled.    Expectant management and discussion of plan and treatment with opportunity to ask questions and all were answered. The patient agreed with the plan and demonstrated an understanding of the instructions.  Advised to call back or seek an in-person evaluation if worsening  or having  further concerns  in interim. Return if worse iin interim, for when planned, as indicated.    Shanon Ace, MD

## 2022-07-16 NOTE — Telephone Encounter (Signed)
Per Dr. Oneida Alar: hold off on exercises for now, she may have aggravated some arthritis in her low back. Continue to use the pad we gave her for foot drop over the next week. If she continues to have pain then we may consider starting her in some formal PT so they can make sure she's doing the exercises correctly. In the meantime she can work on some very gentle hip range of motion. She will call us in a week with an update.

## 2022-07-22 ENCOUNTER — Telehealth: Payer: Self-pay

## 2022-07-22 NOTE — Telephone Encounter (Signed)
Spoke to patient. Changed  a follow up appt from 07/24/22 to 08/27/22 at 2pm.  Patient reports she has not received any call from neurology yet.  Sent a team message to referral coordinator to check on it.   Patient requesting a refill on Tramadol. Please advise!

## 2022-07-24 ENCOUNTER — Ambulatory Visit: Payer: Medicare PPO | Admitting: Internal Medicine

## 2022-07-28 ENCOUNTER — Other Ambulatory Visit: Payer: Self-pay | Admitting: Internal Medicine

## 2022-07-29 NOTE — Telephone Encounter (Signed)
Patient is aware 

## 2022-07-29 NOTE — Telephone Encounter (Signed)
I sent in tramadol today

## 2022-07-30 ENCOUNTER — Ambulatory Visit: Payer: Medicare PPO | Admitting: Sports Medicine

## 2022-07-30 VITALS — BP 110/54 | Ht 60.0 in | Wt 105.0 lb

## 2022-07-30 DIAGNOSIS — M4317 Spondylolisthesis, lumbosacral region: Secondary | ICD-10-CM | POA: Diagnosis not present

## 2022-07-30 DIAGNOSIS — M21371 Foot drop, right foot: Secondary | ICD-10-CM

## 2022-07-30 DIAGNOSIS — M48062 Spinal stenosis, lumbar region with neurogenic claudication: Secondary | ICD-10-CM | POA: Diagnosis not present

## 2022-07-30 NOTE — Progress Notes (Signed)
  SUBJECTIVE:   Chief Complaint: right foot drop   History of Presenting Illness:   Kaitlyn Flowers is an 81 year old female with a past medical history of scoliosis, osteoporosis, back pain, benign spinal cord neoplasm, R leg weakness, R foot drop, and L5-S1 fusion performed 2020 who presents with right foot drop. She was last seen at the [facility] on 10-31 and was given a shoe insert, now presenting for follow-up evaluation.  Since her visit on 10-31, the patient states that the new insert is helping keep her foot straight. She feels more steady when walking. Additionally, she can now use her right foot for both the gas and brake pedals when driving. At first, the insert was comfortable but it has started noticeably pushing up on her big toe to the point of causing discomfort.  She was also prescribed some light strengthening and mobility exercises at her last [facility] visit. After performing them for a few days, she started to experience significant back pain and has since stopped. Additionally, she experienced a two-week episode of constant bilateral lower extremity pain extending from her hips to ankles. She described this pain as a sharp stinging sensation. During these two weeks, ice packs helped alleviate the pain enough so that she could sleep and she took tramadol several times per day. This specific sensation has since resolved without recurrence.   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   Urinary tract infection    Wears hearing aid       OBJECTIVE:   BP (!) 110/54   Ht 5' (1.524 m)   Wt 105 lb (47.6 kg)   LMP  (LMP Unknown)   BMI 20.51 kg/m   Physical Examination:  Right Foot Hallux valgus deformity with mild erythema over medial bony prominence, no swelling, no tenderness to palpation, sensation to light touch intact across dermatomes L4-S1, flexion-extension and inversion-eversion RoM decreased compared to L side, strength 5/5 with plantarflexion-dorsiflexion  Gait Antalgic, high knee lift on R during swing phase that persists with both shoe insert and AFO   ASSESSMENT/PLAN:   Patient has chronic partial right foot drop secondary to moderate-severe right foraminal stenosis at L4-S1, now post L5-S1 fusion, that has caused chronic rightward degeneration.   > Avoid shoes with toe pad to prevent collision force while ambulating     > Self-directed hip mobility exercises and participation in water aerobics > Consider custom orthotic with forefoot lift if not enough help from current inserts      There are no diagnoses linked to this encounter.  No follow-ups on file.  Roswell Nickel, MD  07/30/2022, 11:07 AM    I observed and examined the patient with the resident and agree with assessment and plan.  Note reviewed and modified by me. Ila Mcgill, MD

## 2022-07-30 NOTE — Assessment & Plan Note (Signed)
We will try insole with forefoot lift longer May need a shoe change without a toe counter Trial for a couple of months but consider new custom insole if not improved

## 2022-08-20 NOTE — Therapy (Signed)
OUTPATIENT PHYSICAL THERAPY NEURO EVALUATION   Patient Name: Kaitlyn Flowers MRN: [id number] DOB:1940/10/31, 81 y.o., female Today's Date: 08/21/2022   PCP: Burnis Medin, MD  REFERRING PROVIDER: Stefanie Libel, MD  END OF SESSION:  PT End of Session - 08/21/22 1152     Visit Number 1    Number of Visits 13    Date for PT Re-Evaluation 10/02/22    Authorization Type [employer]    PT Start Time 1024   pt late   PT Stop Time 1104    PT Time Calculation (min) 40 min    Equipment Utilized During Treatment Gait belt    Activity Tolerance Patient tolerated treatment well    Behavior During Therapy WFL for tasks assessed/performed             Past Medical History:  Diagnosis Date   Anemia    Arthritis    Back pain    Cancer ([place])    skin nose   Depression    situational,due to pain   Leg weakness    Osteopenia    PONV (postoperative nausea and vomiting)    Right leg pain    Seizures ([place])    05/13/2019- 4 years ago, "couldnt process" couldnt speak happened 2 times; none since   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   Urinary tract infection    Wears hearing aid    Past Surgical History:  Procedure Laterality Date   ABDOMINAL EXPOSURE N/A 05/17/2019   Procedure: ABDOMINAL EXPOSURE;  Surgeon: Angelia Mould, MD;  Location: [place];  Service: Vascular;  Laterality: N/A;   ABDOMINAL HYSTERECTOMY  1973   1/2 of ovary left cyst on ovary   ANTERIOR LUMBAR FUSION N/A 05/17/2019   Procedure: Lumbar five Sacral one  Anterior lumbar interbody fusion;  Surgeon: Kristeen Miss, MD;  Location: [place];  Service: Neurosurgery;  Laterality: N/A;   APPENDECTOMY     removed with hysterectomy   COLONOSCOPY     LUMBAR WOUND DEBRIDEMENT  12/06/2011   Procedure: LUMBAR WOUND DEBRIDEMENT;  Surgeon: Kristeen Miss, MD;  Location: [place] NEURO ORS;  Service: Neurosurgery;  Laterality: N/A;  Repair of Pseudomeningocele Lumbar wound    ROTATOR CUFF REPAIR     left    shoulder rotator culff  Left 2006   spinal cord tumor  1987   2012 october    Patient Active Problem List   Diagnosis Date Noted   Foot drop, right foot 02/08/2021   Midline cystocele 12/31/2019   Spondylolisthesis at L5-S1 level 05/17/2019   Tension-type headache, not intractable 06/11/2017   Right leg weakness 06/11/2017   Labile hypertension 09/27/2016   Autonomic dysfunction 09/27/2016   Localization-related idiopathic epilepsy and epileptic syndromes with seizures of localized onset, not intractable, without status epilepticus ([place]) 08/01/2016   Hyponatremia 04/01/2016   Nocturnal leg cramps 12/07/2015   Spinal stenosis, lumbar region, 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   Osteoporosis 12/26/2013   Glucosuria 12/16/2012   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/11/2011   Fatigue 11/11/2011   Medicare annual wellness visit, initial 12/18/2010   Wears hearing aid 12/18/2010   Back pain 11/23/2010   ABDOMINAL BRUIT 07/21/2009    ONSET DATE: several years   REFERRING DIAG: M21.371 (ICD-10-CM) - Foot drop, right foot M43.17 (ICD-10-CM) - Spondylolisthesis at L5-S1 level M48.062 (ICD-10-CM) - Spinal stenosis, lumbar region, with neurogenic claudication  THERAPY DIAG:  Unsteadiness on feet  Muscle weakness (  generalized)  Other abnormalities of gait and mobility  Rationale for Evaluation and Treatment: Rehabilitation  SUBJECTIVE:                                                                                                                                                                                             SUBJECTIVE STATEMENT: Patient reports around 2013 she had surgery for a thoracic meningioma. Reports that R foot drop came on slowly and had a lumbar fusion as a result. Reports that she has been given off the shelf AFOs- feels that "they don't work for me." Seeing Dr. Oneida Alar for an insert in her shoe "which has helped more  than anything." Wears a different one when she drives. Reports concerns about falling- does not feel safe walking without walking pole. Feels that balance and LE strength is limited. Reports she has had PT in the past without good results, thus wanting to try aquatic therapy.  Pt accompanied by: self  PERTINENT HISTORY: Anemia, depression, osteopenia, seizures, thoracic meningioma with resection and radiation, L5-S1 anterior lumbar interbody fusion 2020  PAIN:  Are you having pain? Yes: NPRS scale: 7/10 Pain location: LB Pain description: sharp, dull, aching, pulsing Aggravating factors: sitting Relieving factors: laying down on ice  PRECAUTIONS: Fall  WEIGHT BEARING RESTRICTIONS: No  FALLS: Has patient fallen in last 6 months? No  LIVING ENVIRONMENT: Lives with: lives alone Lives in: House/apartment in August:  no stairs to enter Has following equipment at home: [place]ford Rile - 4 wheeled, shower chair, and walking pole  PLOF: Independent; has someone help with deep cleaning; doing yoga 1x/week and adaptive pilates 2x/week; walking with rollator daily   PATIENT GOALS: "want to get some strength back in my legs"  OBJECTIVE:   DIAGNOSTIC FINDINGS: 11/08/20 lumbar CT: Progressed dextroconvex thoracolumbar scoliosis, grade 1 spondylolisthesis at L5-S1 with solid-appearing fusion, Chronic advanced rightward degeneration at both L4-L5 and L5-S1  COGNITION: Overall cognitive status: Within functional limits for tasks assessed   SENSATION: Reports occasional N/T in the R lower leg and foot   POSTURE: increased thoracic kyphosis  LOWER EXTREMITY ROM:     Active  Right Eval Left Eval  Hip flexion    Hip extension    Hip abduction    Hip adduction    Hip internal rotation    Hip external rotation    Knee flexion    Knee extension    Ankle dorsiflexion -5 10  Ankle plantarflexion    Ankle inversion    Ankle eversion     (Blank rows = not tested)  LOWER EXTREMITY MMT:     MMT (in  sitting) Right Eval Left Eval  Hip flexion 3+ 4+  Hip extension    Hip abduction 4+ 4+  Hip adduction 4 4+  Hip internal rotation    Hip external rotation    Knee flexion 4- 4+  Knee extension 2 4+  Ankle dorsiflexion 2 4+  Ankle plantarflexion 4 4+  Ankle inversion    Ankle eversion    (Blank rows = not tested)   GAIT: Gait pattern: R steppage gait with high guarding  Assistive device utilized:  walking pole Level of assistance: SBA    FUNCTIONAL TESTS:   [facility] PT Assessment - 08/21/22 0001       Standardized Balance Assessment   Standardized Balance Assessment Timed Up and Go Test;Five Times Sit to Stand;Berg Balance Test    Five times sit to stand comments  12.71 sec with B armrests   limited eccentric control     Berg Balance Test   Sit to Stand Able to stand  independently using hands    Standing Unsupported Able to stand safely 2 minutes    Sitting with Back Unsupported but Feet Supported on Floor or Stool Able to sit safely and securely 2 minutes    Stand to Sit Controls descent by using hands    Transfers Able to transfer safely, definite need of hands    Standing Unsupported with Eyes Closed Able to stand 10 seconds safely    Standing Unsupported with Feet Together Able to place feet together independently and stand 1 minute safely    From Standing, Reach Forward with Outstretched Arm Can reach forward >12 cm safely (5")    From Standing Position, Pick up Object from Floor Able to pick up shoe, needs supervision    From Standing Position, Turn to Look Behind Over each Shoulder Turn sideways only but maintains balance    Turn 360 Degrees Able to turn 360 degrees safely but slowly    Standing Unsupported, Alternately Place Feet on Step/Stool Needs assistance to keep from falling or unable to try    Standing Unsupported, One Foot in [place] to place foot tandem independently and hold 30 seconds    Standing on One Leg Able to lift leg independently  and hold > 10 seconds    Total Score 43      Timed Up and Go Test   Normal TUG (seconds) 15.82   with walking stick             TODAY'S TREATMENT:                                                                                                                              DATE: 08/21/22    PATIENT EDUCATION: Education details: prognosis, POC, benefits of aquatic therapy; answered patient's questions on therapy expectations and limited muscle return d/t nerve damage  Person educated: Patient Education method: Explanation, Demonstration, Tactile cues, and Verbal cues Education comprehension: verbalized understanding  HOME EXERCISE  PROGRAM: Not yet initiated    GOALS: Goals reviewed with patient? Yes  SHORT TERM GOALS: Target date: 09/11/2022  Patient to be independent with initial HEP. Baseline: HEP initiated Goal status: INITIAL    LONG TERM GOALS: Target date: 10/02/2022  Patient to be independent with advanced HEP. Baseline: Not yet initiated  Goal status: INITIAL  Patient to demonstrate safe stair navigation with 1 handrail as needed. Baseline: pt notes significant difficulty Goal status: INITIAL  Patient to complete TUG in <14 sec with LRAD in order to decrease risk of falls.   Baseline: 15.82 Goal status: INITIAL  Patient to demonstrate 5xSTS with reduced UE support in <15 sec in order to decrease risk of falls and improve functional strength.  Baseline: 12.71 sec with B armrests Goal status: INITIAL  Patient to score at least 46/56 on Berg in order to decrease risk of falls.  Baseline: 43 Goal status: INITIAL    ASSESSMENT:  CLINICAL IMPRESSION:  Patient is an 81 y/o F presenting to OPPT with c/o imbalance and R LE weakness for the past several years. Reports that she continues to stay active with yoga, pilates, and walking but reports fear of falling and imbalance when walking without AD. Patient with hx of R foot drop with resultant L5-S1 fusion  in 2020. Reports interest in aquatic therapy today. Patient today presenting with limited R ankle dorsiflexion AROM, marked weakness in R hip flexor, quad, and anterior tibialis, and gait deviations. Patient's scores on TUG and Berg indicate an increased risk of falls. Would benefit from skilled PT services 2 x/week for 6 weeks to address aforementioned impairments in order to optimize level of function.    OBJECTIVE IMPAIRMENTS: Abnormal gait, decreased balance, decreased ROM, decreased strength, impaired flexibility, postural dysfunction, and pain.   ACTIVITY LIMITATIONS: carrying, lifting, standing, squatting, stairs, transfers, bathing, toileting, dressing, reach over head, and locomotion level  PARTICIPATION LIMITATIONS: meal prep, cleaning, laundry, driving, shopping, community activity, and church  PERSONAL FACTORS: Age, Fitness, Past/current experiences, Time since onset of injury/illness/exacerbation, and 3+ comorbidities: Anemia, depression, osteopenia, seizures, thoracic meningioma with resection and radiation, L5-S1 anterior lumbar interbody fusion 2020  are also affecting patient's functional outcome.   REHAB POTENTIAL: Good  CLINICAL DECISION MAKING: Evolving/moderate complexity  EVALUATION COMPLEXITY: Moderate  PLAN:  PT FREQUENCY: 2x/week  PT DURATION: 6 weeks  PLANNED INTERVENTIONS: Therapeutic exercises, Therapeutic activity, Neuromuscular re-education, Balance training, Gait training, Patient/Family education, Self Care, Joint mobilization, Stair training, Vestibular training, Canalith repositioning, Orthotic/Fit training, DME instructions, Aquatic Therapy, Dry Needling, Electrical stimulation, Cryotherapy, Moist heat, Taping, Manual therapy, and Re-evaluation  PLAN FOR NEXT SESSION: initiate HEP for R LE strengthening and balance; stairs, work on [place] and increased White activities towards R LE   Janene Harvey, PT, DPT 08/21/22 12:06 PM  [place] [facility] [address] [place], [place] [address] Phone # [phone] Fax # [phone]

## 2022-08-21 ENCOUNTER — Other Ambulatory Visit: Payer: Self-pay

## 2022-08-21 ENCOUNTER — Ambulatory Visit: Payer: Medicare PPO | Attending: Sports Medicine | Admitting: Physical Therapy

## 2022-08-21 ENCOUNTER — Encounter: Payer: Self-pay | Admitting: Physical Therapy

## 2022-08-21 DIAGNOSIS — R2681 Unsteadiness on feet: Secondary | ICD-10-CM | POA: Insufficient documentation

## 2022-08-21 DIAGNOSIS — R2689 Other abnormalities of gait and mobility: Secondary | ICD-10-CM | POA: Diagnosis not present

## 2022-08-21 DIAGNOSIS — M48062 Spinal stenosis, lumbar region with neurogenic claudication: Secondary | ICD-10-CM | POA: Diagnosis not present

## 2022-08-21 DIAGNOSIS — M6281 Muscle weakness (generalized): Secondary | ICD-10-CM | POA: Insufficient documentation

## 2022-08-21 DIAGNOSIS — M4317 Spondylolisthesis, lumbosacral region: Secondary | ICD-10-CM | POA: Insufficient documentation

## 2022-08-21 DIAGNOSIS — M21371 Foot drop, right foot: Secondary | ICD-10-CM | POA: Diagnosis not present

## 2022-08-22 DIAGNOSIS — M4180 Other forms of scoliosis, site unspecified: Secondary | ICD-10-CM | POA: Diagnosis not present

## 2022-08-27 ENCOUNTER — Ambulatory Visit: Payer: Medicare PPO | Admitting: Internal Medicine

## 2022-08-27 ENCOUNTER — Telehealth (INDEPENDENT_AMBULATORY_CARE_PROVIDER_SITE_OTHER): Payer: Medicare PPO | Admitting: Family Medicine

## 2022-08-27 ENCOUNTER — Encounter: Payer: Self-pay | Admitting: Family Medicine

## 2022-08-27 ENCOUNTER — Ambulatory Visit: Payer: Medicare PPO | Admitting: Physical Therapy

## 2022-08-27 VITALS — Wt 103.0 lb

## 2022-08-27 DIAGNOSIS — R52 Pain, unspecified: Secondary | ICD-10-CM | POA: Diagnosis not present

## 2022-08-27 MED ORDER — TRAMADOL HCL 50 MG PO TABS: ORAL_TABLET | ORAL | 2 refills | Status: DC

## 2022-08-27 NOTE — Progress Notes (Unsigned)
   Established Patient Office Visit  Subjective   Patient ID: Kaitlyn Flowers, female    DOB: 06/27/1941  Age: 81 y.o. MRN: [id number]  Chief Complaint  Patient presents with   Medication Refill    Patient requests refills on Tramadol  I connected with  Kaitlyn Flowers on 08/28/22 by a video enabled telemedicine application and verified that I am speaking with the correct person using two identifiers.   I discussed the limitations of evaluation and management by telemedicine. The patient expressed understanding and agreed to proceed.   Patient location: home address  Provider location: [facility] office  Patient is here for medication refills. She reports that she was given baclofen by her neurosurgeon and she reports that both medications together were making her very sleepy and "loopy". States that they are working to help with her pain but she is very sensitive to medication. Patient has a neurosurgeon that she sees regularly and I have reviewed her last CT lumbar myelogram which showed severe disc space loss at L2-3 and L3-4 that was new.       Review of Systems  All other systems reviewed and are negative.     Objective:     Wt 103 lb (46.7 kg)   LMP  (LMP Unknown)   BMI 20.12 kg/m    Physical Exam Constitutional:      Appearance: Normal appearance. She is normal weight.  Pulmonary:     Effort: Pulmonary effort is normal.  Neurological:     General: No focal deficit present.     Mental Status: She is alert and oriented to person, place, and time. Mental status is at baseline.  Psychiatric:        Mood and Affect: Mood normal.        Behavior: Behavior normal.        Thought Content: Thought content normal.        Judgment: Judgment normal.      No results found for any visits on 08/27/22.    The ASCVD Risk score (Arnett DK, et al., 2019) failed to calculate for the following reasons:   The 2019 ASCVD risk score is only valid for ages 50 to  84    Assessment & Plan:   Problem List Items Addressed This Visit   None Visit Diagnoses     Pain management    -  Primary   Relevant Medications   traMADol (ULTRAM) 50 MG tablet  Patient reports good control of pain on the tramadol however she is experiencing side effect of sedation/ disorientation, especially when taken with the baclofen. I recommended separating the medications by several hours to see if this will reduce the side effects, using them as needed only. Will refill the tramadol today for the patient, 3 month supply ordered.     I spent 20 minutes in this face to face, video enabled encounter with the patient discussing side effects and strategies to reduce the side effects, also reviewing previous CT scan. No follow-ups on file.    Farrel Conners, MD

## 2022-08-27 NOTE — Therapy (Incomplete)
OUTPATIENT PHYSICAL THERAPY NEURO TREATMENT NOTE   Patient Name: Kaitlyn Flowers MRN: [id number] DOB:[birth year], 81 y.o., female Today's Date: 08/27/2022   PCP: Burnis Medin, MD  REFERRING PROVIDER: Stefanie Libel, MD  END OF SESSION:    Past Medical History:  Diagnosis Date   Anemia    Arthritis    Back pain    Cancer ([place])    skin nose   Depression    situational,due to pain   Leg weakness    Osteopenia    PONV (postoperative nausea and vomiting)    Right leg pain    Seizures ([place])    05/13/2019- 4 years ago, "couldnt process" couldnt speak happened 2 times; none since   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   Urinary tract infection    Wears hearing aid    Past Surgical History:  Procedure Laterality Date   ABDOMINAL EXPOSURE N/A 05/17/2019   Procedure: ABDOMINAL EXPOSURE;  Surgeon: Angelia Mould, MD;  Location: [facility];  Service: Vascular;  Laterality: N/A;   ABDOMINAL HYSTERECTOMY  1973   1/2 of ovary left cyst on ovary   ANTERIOR LUMBAR FUSION N/A 05/17/2019   Procedure: Lumbar five Sacral one  Anterior lumbar interbody fusion;  Surgeon: Kristeen Miss, MD;  Location: [facility];  Service: Neurosurgery;  Laterality: N/A;   APPENDECTOMY     removed with hysterectomy   COLONOSCOPY     LUMBAR WOUND DEBRIDEMENT  12/06/2011   Procedure: LUMBAR WOUND DEBRIDEMENT;  Surgeon: Kristeen Miss, MD;  Location: [facility];  Service: Neurosurgery;  Laterality: N/A;  Repair of Pseudomeningocele Lumbar wound   ROTATOR CUFF REPAIR     left    shoulder rotator culff  Left 2006   spinal cord tumor  1987   2012 october    Patient Active Problem List   Diagnosis Date Noted   Foot drop, right foot 02/08/2021   Midline cystocele 12/31/2019   Spondylolisthesis at L5-S1 level 05/17/2019   Tension-type headache, not intractable 06/11/2017   Right leg weakness 06/11/2017   Labile hypertension 09/27/2016   Autonomic dysfunction  09/27/2016   Localization-related idiopathic epilepsy and epileptic syndromes with seizures of localized onset, not intractable, without status epilepticus ([place]) 08/01/2016   Hyponatremia 04/01/2016   Nocturnal leg cramps 12/07/2015   Spinal stenosis, lumbar region, 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   Osteoporosis 12/26/2013   Glucosuria 12/16/2012   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/11/2011   Fatigue 11/11/2011   Medicare annual wellness visit, initial 12/18/2010   Wears hearing aid 12/18/2010   Back pain 11/23/2010   ABDOMINAL BRUIT 07/21/2009    ONSET DATE: several years   REFERRING DIAG: M21.371 (ICD-10-CM) - Foot drop, right foot M43.17 (ICD-10-CM) - Spondylolisthesis at L5-S1 level M48.062 (ICD-10-CM) - Spinal stenosis, lumbar region, with neurogenic claudication  THERAPY DIAG:  No diagnosis found.  Rationale for Evaluation and Treatment: Rehabilitation  SUBJECTIVE:  SUBJECTIVE STATEMENT: ***Patient reports around 2013 she had surgery for a thoracic meningioma. Reports that R foot drop came on slowly and had a lumbar fusion as a result. Reports that she has been given off the shelf AFOs- feels that "they don't work for me." Seeing Dr. Oneida Alar for an insert in her shoe "which has helped more than anything." Wears a different one when she drives. Reports concerns about falling- does not feel safe walking without walking pole. Feels that balance and LE strength is limited. Reports she has had PT in the past without good results, thus wanting to try aquatic therapy.  Pt accompanied by: self  PERTINENT HISTORY: Anemia, depression, osteopenia, seizures, thoracic meningioma with resection and radiation, L5-S1 anterior lumbar interbody fusion 2020  PAIN:  Are you having pain? Yes: NPRS  scale: 7/10 Pain location: LB Pain description: sharp, dull, aching, pulsing Aggravating factors: sitting Relieving factors: laying down on ice  PRECAUTIONS: Fall  WEIGHT BEARING RESTRICTIONS: No  FALLS: Has patient fallen in last 6 months? No  LIVING ENVIRONMENT: Lives with: lives alone Lives in: House/apartment in [place]:  no stairs to enter Has following equipment at home: Gilford Rile - 4 wheeled, shower chair, and walking pole  PLOF: Independent; has someone help with deep cleaning; doing yoga 1x/week and adaptive pilates 2x/week; walking with rollator daily   PATIENT GOALS: "want to get some strength back in my legs"  OBJECTIVE:    TODAY'S TREATMENT: 08/27/2022 Activity Comments                      ------------------------------------------------------------------------------------------------ Objective measures below taken at initial evaluation: DIAGNOSTIC FINDINGS: 11/08/20 lumbar CT: Progressed dextroconvex thoracolumbar scoliosis, grade 1 spondylolisthesis at L5-S1 with solid-appearing fusion, Chronic advanced rightward degeneration at both L4-L5 and L5-S1  COGNITION: Overall cognitive status: Within functional limits for tasks assessed   SENSATION: Reports occasional N/T in the R lower leg and foot   POSTURE: increased thoracic kyphosis  LOWER EXTREMITY ROM:     Active  Right Eval Left Eval  Hip flexion    Hip extension    Hip abduction    Hip adduction    Hip internal rotation    Hip external rotation    Knee flexion    Knee extension    Ankle dorsiflexion -5 10  Ankle plantarflexion    Ankle inversion    Ankle eversion     (Blank rows = not tested)  LOWER EXTREMITY MMT:    MMT (in sitting) Right Eval Left Eval  Hip flexion 3+ 4+  Hip extension    Hip abduction 4+ 4+  Hip adduction 4 4+  Hip internal rotation    Hip external rotation    Knee flexion 4- 4+  Knee extension 2 4+  Ankle dorsiflexion 2 4+  Ankle plantarflexion 4  4+  Ankle inversion    Ankle eversion    (Blank rows = not tested)   GAIT: Gait pattern: R steppage gait with high guarding  Assistive device utilized:  walking pole Level of assistance: SBA    FUNCTIONAL TESTS:      TODAY'S TREATMENT:  DATE: 08/21/22    PATIENT EDUCATION: Education details: prognosis, POC, benefits of aquatic therapy; answered patient's questions on therapy expectations and limited muscle return d/t nerve damage  Person educated: Patient Education method: Explanation, Demonstration, Tactile cues, and Verbal cues Education comprehension: verbalized understanding  HOME EXERCISE PROGRAM: Not yet initiated    GOALS: Goals reviewed with patient? Yes  SHORT TERM GOALS: Target date: 09/11/2022  Patient to be independent with initial HEP. Baseline: HEP initiated Goal status: INITIAL    LONG TERM GOALS: Target date: 10/02/2022  Patient to be independent with advanced HEP. Baseline: Not yet initiated  Goal status: INITIAL  Patient to demonstrate safe stair navigation with 1 handrail as needed. Baseline: pt notes significant difficulty Goal status: INITIAL  Patient to complete TUG in <14 sec with LRAD in order to decrease risk of falls.   Baseline: 15.82 Goal status: INITIAL  Patient to demonstrate 5xSTS with reduced UE support in <15 sec in order to decrease risk of falls and improve functional strength.  Baseline: 12.71 sec with B armrests Goal status: INITIAL  Patient to score at least 46/56 on Berg in order to decrease risk of falls.  Baseline: 43 Goal status: INITIAL    ASSESSMENT:  CLINICAL IMPRESSION: *** Patient is an 81 y/o F presenting to OPPT with c/o imbalance and R LE weakness for the past several years. Reports that she continues to stay active with yoga, pilates, and walking but reports fear of falling  and imbalance when walking without AD. Patient with hx of R foot drop with resultant L5-S1 fusion in 2020. Reports interest in aquatic therapy today. Patient today presenting with limited R ankle dorsiflexion AROM, marked weakness in R hip flexor, quad, and anterior tibialis, and gait deviations. Patient's scores on TUG and Berg indicate an increased risk of falls. Would benefit from skilled PT services 2 x/week for 6 weeks to address aforementioned impairments in order to optimize level of function.    OBJECTIVE IMPAIRMENTS: Abnormal gait, decreased balance, decreased ROM, decreased strength, impaired flexibility, postural dysfunction, and pain.   ACTIVITY LIMITATIONS: carrying, lifting, standing, squatting, stairs, transfers, bathing, toileting, dressing, reach over head, and locomotion level  PARTICIPATION LIMITATIONS: meal prep, cleaning, laundry, driving, shopping, community activity, and church  PERSONAL FACTORS: Age, Fitness, Past/current experiences, Time since onset of injury/illness/exacerbation, and 3+ comorbidities: Anemia, depression, osteopenia, seizures, thoracic meningioma with resection and radiation, L5-S1 anterior lumbar interbody fusion 2020  are also affecting patient's functional outcome.   REHAB POTENTIAL: Good  CLINICAL DECISION MAKING: Evolving/moderate complexity  EVALUATION COMPLEXITY: Moderate  PLAN:  PT FREQUENCY: 2x/week  PT DURATION: 6 weeks  PLANNED INTERVENTIONS: Therapeutic exercises, Therapeutic activity, Neuromuscular re-education, Balance training, Gait training, Patient/Family education, Self Care, Joint mobilization, Stair training, Vestibular training, Canal[place] repositioning, Orthotic/Fit training, DME instructions, Aquatic Therapy, Dry Needling, Electrical stimulation, Cryotherapy, Moist heat, Taping, Manual therapy, and Re-evaluation  PLAN FOR NEXT SESSION: ***initiate HEP for R LE strengthening and balance; stairs, work on [place] and  increased [place] activities towards [employer], PT 08/27/22 7:49 AM Phone: [phone] Fax: [phone]   [place] [facility] [address], [place], [place] [address] Phone # [phone] Fax # [phone]

## 2022-08-29 ENCOUNTER — Telehealth: Payer: Self-pay | Admitting: Pharmacist

## 2022-08-29 NOTE — Chronic Care Management (AMB) (Signed)
    Chronic Care Management Pharmacy Assistant   Name: Kaitlyn Flowers  MRN: [id number] DOB: 07-Jan-1941  Reason for Encounter: Follow up on Pain and Nausea per MP   Recent office visits:  08/27/22 Farrel Conners, MD - Patient presented via video for Pain management. Stopped Garlic. recommended separating the Tramadol and Baclofen by several hours to see if this will reduce the side effects, using them as needed only.  07/16/22 Panosh, Standley Brooking, MD - Patient presented for Nerve pain and other concerns. Stopped Duloxetine HCL.  06/19/22 Panosh, Standley Brooking, MD - Patient presented for Medication Management and other concerns. Increased Duloxetine. Changed Tramadol.  05/21/22 Panosh, Standley Brooking, MD - Patient presented for Gait abnormality and other concerns. No medication changes. Patient Advised Retry cymbalta 20.  04/29/22 Kellie Simmering, LPN - Patient presented for Medicare AWV. Patient voiced goal of getting stronger. No medication changes.   Recent consult visits:  08/22/22 Ellene Route MD, Blanchie Dessert - Patient presented to [facility] for Other forms of Scoliosis site unspecified. No medication changes noted.  08/21/22 June Leap, PT - Patient presented for unsteadiness on feet and other concerns. No medication changes.   07/30/22 Stefanie Libel, MD (Sports Med) - Patient presented for Foot drop right and other concerns. No medication changes.  07/02/22 Stefanie Libel, MD (Sports Med) - Patient presented for right foot drop. No medication changes.  06/21/22 Jaquita Folds, MD (OBGYN) - Patient presented for SUI and other concerns. No medication changes.   06/13/22 Philemon Kingdom, MD (Endo) - Patient presented for Osteoporosis unspecified type unspecified pathological fracture and other concerns. Administered Prolia.   05/08/22 Elita Quick, CMA (UroGYN) - Patient presented for Urinary Frequency. No medication changes.   04/26/22 Hoshal, Davene Costain (Otolaryngology) - Patient presented for Bilateral impacted cerumen. No medication changes.  04/08/22 Shearon Stalls PAC, Spero Geralds - Patient presented to [facility] for Uncomplicated opioid dependence and other concerns. No medication changes noted.  Hospital visits:  None in previous 6 months  Medications: Outpatient Encounter Medications as of 08/29/2022  Medication Sig   Acetylcysteine (N-ACETYL-L-CYSTEINE PO) Take 500 mg by mouth daily.   baclofen (LIORESAL) 10 MG tablet Take 10 mg by mouth 4 (four) times daily.   Cholecalciferol (VITAMIN D3) 125 MCG (5000 UT) TABS Take 1 tablet by mouth daily.   cyanocobalamin 1000 MCG tablet Take 1,000 mcg by mouth daily.   lamoTRIgine (LAMICTAL) 25 MG tablet TAKE 1 TABLET(25 MG) BY MOUTH EVERY EVENING   MAGNESIUM PO Take 1 tablet by mouth daily.   NONFORMULARY OR COMPOUNDED ITEM Vitamin E vaginal cream 200u/ml.  One ml pv twice weekly.  Disp. 3 month supply.   pyridoxine (B-6) 200 MG tablet Take 200 mg by mouth daily.   traMADol (ULTRAM) 50 MG tablet TAKE 1 TABLET(50 MG) BY MOUTH TWICE DAILY AS NEEDED   No facility-administered encounter medications on file as of 08/29/2022.   Notes: Call to pt to see how her pain is and how she is doing with the duloxetine, pt reports things are going ok she is still feeling groggy but has been experimenting with the spacing out of the tramadol and baclofen per Dr Norva Riffle advice, she reports she will continue to experiment with the timing to see what works best for her.   Care Gaps: TDAP - Overdue COVID Booster - Postponed AWV -04/29/22  Star Rating Drugs: None    Ned [facility] Clinical Pharmacist Assistant [phone]

## 2022-08-30 ENCOUNTER — Encounter (HOSPITAL_BASED_OUTPATIENT_CLINIC_OR_DEPARTMENT_OTHER): Payer: Self-pay | Admitting: Physical Therapy

## 2022-08-30 ENCOUNTER — Ambulatory Visit (HOSPITAL_BASED_OUTPATIENT_CLINIC_OR_DEPARTMENT_OTHER): Payer: Medicare PPO | Attending: Sports Medicine | Admitting: Physical Therapy

## 2022-08-30 DIAGNOSIS — R2689 Other abnormalities of gait and mobility: Secondary | ICD-10-CM | POA: Diagnosis not present

## 2022-08-30 DIAGNOSIS — R2681 Unsteadiness on feet: Secondary | ICD-10-CM | POA: Diagnosis not present

## 2022-08-30 DIAGNOSIS — M6281 Muscle weakness (generalized): Secondary | ICD-10-CM

## 2022-08-30 NOTE — Therapy (Signed)
OUTPATIENT PHYSICAL THERAPY NEURO    Patient Name: Kaitlyn Flowers MRN: [id number] DOB:27-Jul-1941, 81 y.o., female Today's Date: 08/30/2022   PCP: Burnis Medin, MD  REFERRING PROVIDER: Stefanie Libel, MD  END OF SESSION:  PT End of Session - 08/30/22 1127     Visit Number 2    Number of Visits 13    Date for PT Re-Evaluation 10/02/22    Authorization Type [employer]    PT Start Time 1122    PT Stop Time 1200    PT Time Calculation (min) 38 min    Equipment Utilized During Treatment Gait belt    Activity Tolerance Patient tolerated treatment well    Behavior During Therapy WFL for tasks assessed/performed             Past Medical History:  Diagnosis Date   Anemia    Arthritis    Back pain    Cancer ([place])    skin nose   Depression    situational,due to pain   Leg weakness    Osteopenia    PONV (postoperative nausea and vomiting)    Right leg pain    Seizures ([place])    05/13/2019- 4 years ago, "couldnt process" couldnt speak happened 2 times; none since   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   Urinary tract infection    Wears hearing aid    Past Surgical History:  Procedure Laterality Date   ABDOMINAL EXPOSURE N/A 05/17/2019   Procedure: ABDOMINAL EXPOSURE;  Surgeon: Angelia Mould, MD;  Location: [facility];  Service: Vascular;  Laterality: N/A;   ABDOMINAL HYSTERECTOMY  1973   1/2 of ovary left cyst on ovary   ANTERIOR LUMBAR FUSION N/A 05/17/2019   Procedure: Lumbar five Sacral one  Anterior lumbar interbody fusion;  Surgeon: Kristeen Miss, MD;  Location: [facility];  Service: Neurosurgery;  Laterality: N/A;   APPENDECTOMY     removed with hysterectomy   COLONOSCOPY     LUMBAR WOUND DEBRIDEMENT  12/06/2011   Procedure: LUMBAR WOUND DEBRIDEMENT;  Surgeon: Kristeen Miss, MD;  Location: [facility];  Service: Neurosurgery;  Laterality: N/A;  Repair of Pseudomeningocele Lumbar wound   ROTATOR CUFF REPAIR      left    shoulder rotator culff  Left 2006   spinal cord tumor  1987   2012 october    Patient Active Problem List   Diagnosis Date Noted   Foot drop, right foot 02/08/2021   Midline cystocele 12/31/2019   Spondylolisthesis at L5-S1 level 05/17/2019   Tension-type headache, not intractable 06/11/2017   Right leg weakness 06/11/2017   Labile hypertension 09/27/2016   Autonomic dysfunction 09/27/2016   Localization-related idiopathic epilepsy and epileptic syndromes with seizures of localized onset, not intractable, without status epilepticus ([place]) 08/01/2016   Hyponatremia 04/01/2016   Nocturnal leg cramps 12/07/2015   Spinal stenosis, lumbar region, 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   Osteoporosis 12/26/2013   Glucosuria 12/16/2012   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/11/2011   Fatigue 11/11/2011   Medicare annual wellness visit, initial 12/18/2010   Wears hearing aid 12/18/2010   Back pain 11/23/2010   ABDOMINAL BRUIT 07/21/2009    ONSET DATE: several years   REFERRING DIAG: M21.371 (ICD-10-CM) - Foot drop, right foot M43.17 (ICD-10-CM) - Spondylolisthesis at L5-S1 level M48.062 (ICD-10-CM) - Spinal stenosis, lumbar region, with neurogenic claudication  THERAPY DIAG:  Unsteadiness on feet  Muscle weakness (generalized)  Other  abnormalities of gait and mobility  Rationale for Evaluation and Treatment: Rehabilitation  SUBJECTIVE: "Not afraid of water"                                                                                                                                                                                            SUBJECTIVE STATEMENT: Patient reports around 2013 she had surgery for a thoracic meningioma. Reports that R foot drop came on slowly and had a lumbar fusion as a result. Reports that she has been given off the shelf AFOs- feels that "they don't work for me." Seeing Dr. Oneida Alar for an insert in her shoe "which has helped more than  anything." Wears a different one when she drives. Reports concerns about falling- does not feel safe walking without walking pole. Feels that balance and LE strength is limited. Reports she has had PT in the past without good results, thus wanting to try aquatic therapy.  Pt accompanied by: self  PERTINENT HISTORY: Anemia, depression, osteopenia, seizures, thoracic meningioma with resection and radiation, L5-S1 anterior lumbar interbody fusion 2020  PAIN:  Are you having pain? Yes: NPRS scale: 6 /10 Pain location: LB Pain description: sharp, dull, aching, pulsing Aggravating factors: sitting Relieving factors: laying down on ice  PRECAUTIONS: Fall  WEIGHT BEARING RESTRICTIONS: No  FALLS: Has patient fallen in last 6 months? No  LIVING ENVIRONMENT: Lives with: lives alone Lives in: House/apartment in [place]:  no stairs to enter Has following equipment at home: Gilford Rile - 4 wheeled, shower chair, and walking pole  PLOF: Independent; has someone help with deep cleaning; doing yoga 1x/week and adaptive pilates 2x/week; walking with rollator daily   PATIENT GOALS: "want to get some strength back in my legs"  OBJECTIVE:   DIAGNOSTIC FINDINGS: 11/08/20 lumbar CT: Progressed dextroconvex thoracolumbar scoliosis, grade 1 spondylolisthesis at L5-S1 with solid-appearing fusion, Chronic advanced rightward degeneration at both L4-L5 and L5-S1  COGNITION: Overall cognitive status: Within functional limits for tasks assessed   SENSATION: Reports occasional N/T in the R lower leg and foot   POSTURE: increased thoracic kyphosis  LOWER EXTREMITY ROM:     Active  Right Eval Left Eval  Hip flexion    Hip extension    Hip abduction    Hip adduction    Hip internal rotation    Hip external rotation    Knee flexion    Knee extension    Ankle dorsiflexion -5 10  Ankle plantarflexion    Ankle inversion    Ankle eversion     (Blank rows = not tested)  LOWER EXTREMITY MMT:     MMT (  in sitting) Right Eval Left Eval  Hip flexion 3+ 4+  Hip extension    Hip abduction 4+ 4+  Hip adduction 4 4+  Hip internal rotation    Hip external rotation    Knee flexion 4- 4+  Knee extension 2 4+  Ankle dorsiflexion 2 4+  Ankle plantarflexion 4 4+  Ankle inversion    Ankle eversion    (Blank rows = not tested)   GAIT: Gait pattern: R steppage gait with high guarding  Assistive device utilized:  walking pole Level of assistance: SBA    FUNCTIONAL TESTS:      TODAY'S TREATMENT:                                                                                                                              Pt seen for aquatic therapy today.  Treatment took place in water 3.25-4.5 ft in depth at the [facility]. Temp of water was 91.  Pt entered/exited the pool via stairs step to pattern with hand rail.  Walking ue supported on white barbell With TrA seated on bench feet supported on water step:1/2 noodle pull dowon to knees 2x5 (difficulty maintaining right knee on water step); standing x 10 STS from bench to step with difficulty x 3 then onto pool bottom. Cues for hip and knee engagement  Pt requires the buoyancy and hydrostatic pressure of water for support, and to offload joints by unweighting joint load by at least 50 % in navel deep water and by at least 75-80% in chest to neck deep water.  Viscosity of the water is needed for resistance of strengthening. Water current perturbations provides challenge to standing balance requiring increased core activation.     PATIENT EDUCATION: Education details: prognosis, POC, benefits of aquatic therapy; answered patient's questions on therapy expectations and limited muscle return d/t nerve damage  Person educated: Patient Education method: Explanation, Demonstration, Tactile cues, and Verbal cues Education comprehension: verbalized understanding  HOME EXERCISE PROGRAM: Not yet initiated     GOALS: Goals reviewed with patient? Yes  SHORT TERM GOALS: Target date: 09/11/2022  Patient to be independent with initial HEP. Baseline: HEP initiated Goal status: INITIAL    LONG TERM GOALS: Target date: 10/02/2022  Patient to be independent with advanced HEP. Baseline: Not yet initiated  Goal status: INITIAL  Patient to demonstrate safe stair navigation with 1 handrail as needed. Baseline: pt notes significant difficulty Goal status: INITIAL  Patient to complete TUG in <14 sec with LRAD in order to decrease risk of falls.   Baseline: 15.82 Goal status: INITIAL  Patient to demonstrate 5xSTS with reduced UE support in <15 sec in order to decrease risk of falls and improve functional strength.  Baseline: 12.71 sec with B armrests Goal status: INITIAL  Patient to score at least 46/56 on Berg in order to decrease risk of falls.  Baseline: 43 Goal status: INITIAL    ASSESSMENT:  CLINICAL IMPRESSION:  Pt safe and indep in setting with therapist instructing from deck. She is directed thorugh session assessing her toleration.  She reports some discomfort in back with initial amb in 3.6 ft, moved her to 3.8-4 ft with decreased discomfort. Worked core strength in sitting and standing which she tolerates well. VC and assistance need with STS as she does not use LE much to rise relying more on bak.  She is a good candidate for aquatic therapy and will benefit from the properties of water to progress to goals.  She does not at this point have access to a swimming pool.  Will discuss later in series gaining access if so desired.   Patient is an 81 y/o F presenting to [facility] with c/o imbalance and R LE weakness for the past several years. Reports that she continues to stay active with yoga, pilates, and walking but reports fear of falling and imbalance when walking without AD. Patient with hx of R foot drop with resultant L5-S1 fusion in 2020. Reports interest in aquatic therapy today.  Patient today presenting with limited R ankle dorsiflexion AROM, marked weakness in R hip flexor, quad, and anterior tibialis, and gait deviations. Patient's scores on TUG and Berg indicate an increased risk of falls. Would benefit from skilled PT services 2 x/week for 6 weeks to address aforementioned impairments in order to optimize level of function.    OBJECTIVE IMPAIRMENTS: Abnormal gait, decreased balance, decreased ROM, decreased strength, impaired flexibility, postural dysfunction, and pain.   ACTIVITY LIMITATIONS: carrying, lifting, standing, squatting, stairs, transfers, bathing, toileting, dressing, reach over head, and locomotion level  PARTICIPATION LIMITATIONS: meal prep, cleaning, laundry, driving, shopping, community activity, and church  PERSONAL FACTORS: Age, Fitness, Past/current experiences, Time since onset of injury/illness/exacerbation, and 3+ comorbidities: Anemia, depression, osteopenia, seizures, thoracic meningioma with resection and radiation, L5-S1 anterior lumbar interbody fusion 2020  are also affecting patient's functional outcome.   REHAB POTENTIAL: Good  CLINICAL DECISION MAKING: Evolving/moderate complexity  EVALUATION COMPLEXITY: Moderate  PLAN:  PT FREQUENCY: 2x/week  PT DURATION: 6 weeks  PLANNED INTERVENTIONS: Therapeutic exercises, Therapeutic activity, Neuromuscular re-education, Balance training, Gait training, Patient/Family education, Self Care, Joint mobilization, Stair training, Vestibular training, [place] repositioning, Orthotic/Fit training, DME instructions, Aquatic Therapy, Dry Needling, Electrical stimulation, Cryotherapy, Moist heat, Taping, Manual therapy, and Re-evaluation  PLAN FOR NEXT SESSION: initiate HEP for R LE strengthening and balance; stairs, work on [place] and increased [place] activities towards [place] ([place]) Blessings Inglett MPT 08/30/22 2:58 PM

## 2022-09-02 ENCOUNTER — Ambulatory Visit (HOSPITAL_COMMUNITY)
Admission: RE | Admit: 2022-09-02 | Discharge: 2022-09-02 | Disposition: A | Discharge status: Home / Self Care | Payer: Medicare PPO | Source: Ambulatory Visit | Attending: Internal Medicine | Admitting: Internal Medicine

## 2022-09-02 ENCOUNTER — Encounter (HOSPITAL_COMMUNITY): Payer: Self-pay

## 2022-09-02 ENCOUNTER — Ambulatory Visit (INDEPENDENT_AMBULATORY_CARE_PROVIDER_SITE_OTHER): Payer: Medicare PPO

## 2022-09-02 VITALS — BP 142/64 | HR 71 | Temp 97.9°F | Resp 16

## 2022-09-02 DIAGNOSIS — R1032 Left lower quadrant pain: Secondary | ICD-10-CM

## 2022-09-02 DIAGNOSIS — R109 Unspecified abdominal pain: Secondary | ICD-10-CM

## 2022-09-02 NOTE — ED Triage Notes (Signed)
Patient with c/o left lower abdominal pain. States she is constipated and has diarrhea. For about 3 days she will be constipated and then the next 3 days has diarrhea. States she has a history of "twisting bowel". States this happens once every 6 months or so and usually goes away but isn't going away this time. States it is worse with eating.

## 2022-09-02 NOTE — ED Provider Notes (Signed)
[facility]    CSN: [id number] Arrival date & time: 09/02/22  1117      History   Chief Complaint Chief Complaint  Patient presents with   Abdominal Pain    Left side abdominal pain.  Painful and worse after I eat.  Going on for 2 weeks but getting worse. - Entered by patient    HPI Kaitlyn Flowers is a 82 y.o. female comes to the urgent care with left lower quadrant abdominal pain for about 2 weeks.  Patient says symptoms started about 2 weeks ago and has been persistent.  She describes episodes of constipation followed by diarrhea.  No abdominal distention.  No nausea, vomiting.  Left lower quadrant pain is crampy in nature.  Bowel movement is negative for blood or mucus.  She denies any changes in dietary habits.  No recent travel.  She denies any weight loss or night sweats.  No nausea or vomiting.  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   Urinary tract infection    Wears hearing aid     Patient Active Problem List   Diagnosis Date Noted   Foot drop, right foot 02/08/2021   Midline cystocele 12/31/2019   Spondylolisthesis at L5-S1 level 05/17/2019   Tension-type headache, not intractable 06/11/2017   Right leg weakness 06/11/2017   Labile hypertension 09/27/2016   Autonomic dysfunction 09/27/2016   Localization-related idiopathic epilepsy and epileptic syndromes with seizures of localized onset, not intractable, without status epilepticus ([place]) 08/01/2016   Hyponatremia 04/01/2016   Nocturnal leg cramps 12/07/2015   Spinal stenosis, lumbar  region, 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   Osteoporosis 12/26/2013   Glucosuria 12/16/2012   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/11/2011   Fatigue 11/11/2011   Medicare annual wellness visit, initial 12/18/2010   Wears hearing aid 12/18/2010   Back pain 11/23/2010   ABDOMINAL BRUIT 07/21/2009    Past Surgical History:  Procedure Laterality Date   ABDOMINAL EXPOSURE N/A 05/17/2019   Procedure: ABDOMINAL EXPOSURE;  Surgeon: Angelia Mould, MD;  Location: [facility];  Service: Vascular;  Laterality: N/A;   ABDOMINAL HYSTERECTOMY  1973   1/2 of ovary left cyst on ovary   ANTERIOR LUMBAR FUSION N/A 05/17/2019   Procedure: Lumbar five Sacral one  Anterior lumbar interbody fusion;  Surgeon: Kristeen Miss, MD;  Location: [facility];  Service: Neurosurgery;  Laterality: N/A;   APPENDECTOMY     removed with hysterectomy   COLONOSCOPY     LUMBAR WOUND DEBRIDEMENT  12/06/2011   Procedure: LUMBAR WOUND DEBRIDEMENT;  Surgeon: Kristeen Miss, MD;  Location: [facility];  Service: Neurosurgery;  Laterality: N/A;  Repair of Pseudomeningocele Lumbar wound   ROTATOR CUFF REPAIR     left    shoulder rotator culff  Left 2006   spinal cord tumor  1987   2012 october     OB History     Gravida  1  Para  1   Term  1   Preterm      AB      Living  1      SAB      IAB      Ectopic      Multiple      Live Births  1            Home Medications    Prior to Admission medications   Medication Sig Start Date End Date Taking? Authorizing Provider  Acetylcysteine (N-ACETYL-L-CYSTEINE PO) Take 500 mg by mouth daily.    [provider]  baclofen (LIORESAL) 10 MG tablet Take 10 mg by mouth 4 (four) times daily.    [provider]  Cholecalciferol (VITAMIN D3) 125 MCG (5000 UT) TABS Take 1 tablet by mouth daily.    [provider]  cyanocobalamin 1000 MCG tablet Take 1,000 mcg by mouth daily.    [provider]  lamoTRIgine (LAMICTAL) 25 MG  tablet TAKE 1 TABLET(25 MG) BY MOUTH EVERY EVENING 10/08/21   Cameron Sprang, MD  MAGNESIUM PO Take 1 tablet by mouth daily.    [provider]  NONFORMULARY OR COMPOUNDED ITEM Vitamin E vaginal cream 200u/ml.  One ml pv twice weekly.  Disp. 3 month supply. 09/20/21   Megan Salon, MD  pyridoxine (B-6) 200 MG tablet Take 200 mg by mouth daily.    [provider]  traMADol (ULTRAM) 50 MG tablet TAKE 1 TABLET(50 MG) BY MOUTH TWICE DAILY AS NEEDED 08/27/22   Farrel Conners, MD    Family History Family History  Problem Relation Age of Onset   Stroke Mother        2010   Heart attack Father    Cancer Paternal Grandmother     Social History Social History   Tobacco Use   Smoking status: Former    Packs/day: 0.25    Years: 2.00    Total pack years: 0.50    Types: Cigarettes    Quit date: 09/03/1967    Years since quitting: 55.0   Smokeless tobacco: Never   Tobacco comments:    quit 48 years   Vaping Use   Vaping Use: Never used  Substance Use Topics   Alcohol use: Yes    Alcohol/week: 4.0 standard drinks of alcohol    Types: 4 Glasses of wine per week    Comment: 4 a week   Drug use: No     Allergies   Ibuprofen-famotidine, Fentanyl, Pregabalin, and Penicillins   Review of Systems Review of Systems  Gastrointestinal:  Positive for constipation and diarrhea. Negative for abdominal pain, nausea, rectal pain and vomiting.  Genitourinary: Negative.      Physical Exam Triage Vital Signs ED Triage Vitals  Enc Vitals Group     BP 09/02/22 1143 (!) 142/64     Pulse Rate 09/02/22 1143 71     Resp 09/02/22 1143 16     Temp 09/02/22 1143 97.9 F (36.6 C)     Temp Source 09/02/22 1143 Oral     SpO2 09/02/22 1143 97 %     Weight --      Height --      Head Circumference --      Peak Flow --      Pain Score 09/02/22 1144 7     Pain Loc --      Pain Edu? --      Excl. in [place]? --  No data found.  Updated Vital Signs BP (!) 142/64 (BP Location:  Right Arm)   Pulse 71   Temp 97.9 F (36.6 C) (Oral)   Resp 16   LMP  (LMP Unknown)   SpO2 97%   Visual Acuity Right Eye Distance:   Left Eye Distance:   Bilateral Distance:    Right Eye Near:   Left Eye Near:    Bilateral Near:     Physical Exam Vitals and nursing note reviewed.  Constitutional:      General: She is not in acute distress.    Appearance: She is not ill-appearing.  Cardiovascular:     Rate and Rhythm: Normal rate and regular rhythm.  Pulmonary:     Effort: Pulmonary effort is normal.     Breath sounds: Normal breath sounds.  Abdominal:     General: Bowel sounds are normal.     Palpations: Abdomen is soft. There is no fluid wave, hepatomegaly or splenomegaly.     Tenderness: There is abdominal tenderness in the left lower quadrant. There is no guarding or rebound. Negative signs include Rovsing's sign and McBurney's sign.     Hernia: No hernia is present.  Neurological:     Mental Status: She is alert.      UC Treatments / Results  Labs (all labs ordered are listed, but only abnormal results are displayed) Labs Reviewed - No data to display  EKG   Radiology DG Abd 1 View  Result Date: 09/02/2022 CLINICAL DATA:  Abdominal pain. EXAM: ABDOMEN - 1 VIEW COMPARISON:  05/17/2019 radiograph and 03/30/2020 CT FINDINGS: The bowel gas pattern is unremarkable. There is no evidence of bowel obstruction. A small amount of stool within the colon is noted. No suspicious calcifications are identified. A severe thoracolumbar scoliosis and fusion hardware at L5-S1 again noted. No acute bony abnormalities are identified. IMPRESSION: No acute abnormalities. Unremarkable bowel gas pattern. Electronically Signed   By: Margarette Canada M.D.   On: 09/02/2022 12:47    Procedures Procedures (including critical care time)  Medications Ordered in UC Medications - No data to display  Initial Impression / Assessment and Plan / UC Course  I have reviewed the triage vital signs and  the nursing notes.  Pertinent labs & imaging results that were available during my care of the patient were reviewed by me and considered in my medical decision making (see chart for details).     1.  Abdominal pain, left lower quadrant: KUB is negative for constipation Tylenol as needed for pain Probiotic use Abdominal exam is benign with no guarding or rebound tenderness.  Bowel sounds are normal Return precautions given. Final Clinical Impressions(s) / UC Diagnoses   Final diagnoses:  Abdominal pain, left lower quadrant     Discharge Instructions      X-ray of the abdomen is negative for intestinal obstruction Increase oral fluid intake Over-the-counter probiotic use will help with diarrhea If you have worsening symptoms please return to urgent care to be reevaluated.   ED Prescriptions   None    PDMP not reviewed this encounter.   Chase Picket, MD 09/02/22 [phone]

## 2022-09-02 NOTE — Discharge Instructions (Addendum)
X-ray of the abdomen is negative for intestinal obstruction Increase oral fluid intake Over-the-counter probiotic use will help with diarrhea If you have worsening symptoms please return to urgent care to be reevaluated.

## 2022-09-04 ENCOUNTER — Ambulatory Visit (HOSPITAL_BASED_OUTPATIENT_CLINIC_OR_DEPARTMENT_OTHER): Payer: Medicare PPO | Admitting: Physical Therapy

## 2022-09-05 ENCOUNTER — Ambulatory Visit: Payer: Medicare PPO | Admitting: Neurology

## 2022-09-09 NOTE — Therapy (Incomplete)
OUTPATIENT PHYSICAL THERAPY NEURO TREATMENT   Patient Name: Kaitlyn Flowers MRN: [id number] DOB:1941/04/04, 82 y.o., female Today's Date: 09/09/2022   PCP: Burnis Medin, MD  REFERRING PROVIDER: Stefanie Libel, MD  END OF SESSION:    Past Medical History:  Diagnosis Date   Anemia    Arthritis    Back pain    Cancer ([place])    skin nose   Depression    situational,due to pain   Leg weakness    Osteopenia    PONV (postoperative nausea and vomiting)    Right leg pain    Seizures ([place])    05/13/2019- 4 years ago, "couldnt process" couldnt speak happened 2 times; none since   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   Urinary tract infection    Wears hearing aid    Past Surgical History:  Procedure Laterality Date   ABDOMINAL EXPOSURE N/A 05/17/2019   Procedure: ABDOMINAL EXPOSURE;  Surgeon: Angelia Mould, MD;  Location: [facility];  Service: Vascular;  Laterality: N/A;   ABDOMINAL HYSTERECTOMY  1973   1/2 of ovary left cyst on ovary   ANTERIOR LUMBAR FUSION N/A 05/17/2019   Procedure: Lumbar five Sacral one  Anterior lumbar interbody fusion;  Surgeon: Kristeen Miss, MD;  Location: [facility];  Service: Neurosurgery;  Laterality: N/A;   APPENDECTOMY     removed with hysterectomy   COLONOSCOPY     LUMBAR WOUND DEBRIDEMENT  12/06/2011   Procedure: LUMBAR WOUND DEBRIDEMENT;  Surgeon: Kristeen Miss, MD;  Location: [facility];  Service: Neurosurgery;  Laterality: N/A;  Repair of Pseudomeningocele Lumbar wound   ROTATOR CUFF REPAIR     left    shoulder rotator culff  Left 2006   spinal cord tumor  1987   2012 october    Patient Active Problem List   Diagnosis Date Noted   Foot drop, right foot 02/08/2021   Midline cystocele 12/31/2019   Spondylolisthesis at L5-S1 level 05/17/2019   Tension-type headache, not intractable 06/11/2017   Right leg weakness 06/11/2017   Labile hypertension 09/27/2016   Autonomic dysfunction  09/27/2016   Localization-related idiopathic epilepsy and epileptic syndromes with seizures of localized onset, not intractable, without status epilepticus ([place]) 08/01/2016   Hyponatremia 04/01/2016   Nocturnal leg cramps 12/07/2015   Spinal stenosis, lumbar region, 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   Osteoporosis 12/26/2013   Glucosuria 12/16/2012   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/11/2011   Fatigue 11/11/2011   Medicare annual wellness visit, initial 12/18/2010   Wears hearing aid 12/18/2010   Back pain 11/23/2010   ABDOMINAL BRUIT 07/21/2009    ONSET DATE: several years   REFERRING DIAG: M21.371 (ICD-10-CM) - Foot drop, right foot M43.17 (ICD-10-CM) - Spondylolisthesis at L5-S1 level M48.062 (ICD-10-CM) - Spinal stenosis, lumbar region, with neurogenic claudication  THERAPY DIAG:  No diagnosis found.  Rationale for Evaluation and Treatment: Rehabilitation  SUBJECTIVE:  SUBJECTIVE STATEMENT: Patient reports around 2013 she had surgery for a thoracic meningioma. Reports that R foot drop came on slowly and had a lumbar fusion as a result. Reports that she has been given off the shelf AFOs- feels that "they don't work for me." Seeing Dr. Oneida Alar for an insert in her shoe "which has helped more than anything." Wears a different one when she drives. Reports concerns about falling- does not feel safe walking without walking pole. Feels that balance and LE strength is limited. Reports she has had PT in the past without good results, thus wanting to try aquatic therapy.  Pt accompanied by: self  PERTINENT HISTORY: Anemia, depression, osteopenia, seizures, thoracic meningioma with resection and radiation, L5-S1 anterior lumbar interbody fusion 2020  PAIN:  Are you having pain? Yes: NPRS scale:  7/10 Pain location: LB Pain description: sharp, dull, aching, pulsing Aggravating factors: sitting Relieving factors: laying down on ice  PRECAUTIONS: Fall  WEIGHT BEARING RESTRICTIONS: No  FALLS: Has patient fallen in last 6 months? No  LIVING ENVIRONMENT: Lives with: lives alone Lives in: House/apartment in [place]:  no stairs to enter Has following equipment at home: Gilford Rile - 4 wheeled, shower chair, and walking pole  PLOF: Independent; has someone help with deep cleaning; doing yoga 1x/week and adaptive pilates 2x/week; walking with rollator daily   PATIENT GOALS: "want to get some strength back in my legs"  OBJECTIVE:      TODAY'S TREATMENT: 09/10/22 Activity Comments                          Below measures were taken at time of initial evaluation unless otherwise specified:   DIAGNOSTIC FINDINGS: 11/08/20 lumbar CT: Progressed dextroconvex thoracolumbar scoliosis, grade 1 spondylolisthesis at L5-S1 with solid-appearing fusion, Chronic advanced rightward degeneration at both L4-L5 and L5-S1  COGNITION: Overall cognitive status: Within functional limits for tasks assessed   SENSATION: Reports occasional N/T in the R lower leg and foot   POSTURE: increased thoracic kyphosis  LOWER EXTREMITY ROM:     Active  Right Eval Left Eval  Hip flexion    Hip extension    Hip abduction    Hip adduction    Hip internal rotation    Hip external rotation    Knee flexion    Knee extension    Ankle dorsiflexion -5 10  Ankle plantarflexion    Ankle inversion    Ankle eversion     (Blank rows = not tested)  LOWER EXTREMITY MMT:    MMT (in sitting) Right Eval Left Eval  Hip flexion 3+ 4+  Hip extension    Hip abduction 4+ 4+  Hip adduction 4 4+  Hip internal rotation    Hip external rotation    Knee flexion 4- 4+  Knee extension 2 4+  Ankle dorsiflexion 2 4+  Ankle plantarflexion 4 4+  Ankle inversion    Ankle eversion    (Blank rows =  not tested)   GAIT: Gait pattern: R steppage gait with high guarding  Assistive device utilized:  walking pole Level of assistance: SBA    FUNCTIONAL TESTS:      TODAY'S TREATMENT:  DATE: 08/21/22    PATIENT EDUCATION: Education details: prognosis, POC, benefits of aquatic therapy; answered patient's questions on therapy expectations and limited muscle return d/t nerve damage  Person educated: Patient Education method: Explanation, Demonstration, Tactile cues, and Verbal cues Education comprehension: verbalized understanding  HOME EXERCISE PROGRAM: Not yet initiated    GOALS: Goals reviewed with patient? Yes  SHORT TERM GOALS: Target date: 09/11/2022  Patient to be independent with initial HEP. Baseline: HEP initiated Goal status: IN PROGRESS    LONG TERM GOALS: Target date: 10/02/2022  Patient to be independent with advanced HEP. Baseline: Not yet initiated  Goal status: IN PROGRESS  Patient to demonstrate safe stair navigation with 1 handrail as needed. Baseline: pt notes significant difficulty Goal status: IN PROGRESS  Patient to complete TUG in <14 sec with LRAD in order to decrease risk of falls.   Baseline: 15.82 Goal status: IN PROGRESS  Patient to demonstrate 5xSTS with reduced UE support in <15 sec in order to decrease risk of falls and improve functional strength.  Baseline: 12.71 sec with B armrests Goal status: IN PROGRESS  Patient to score at least 46/56 on Berg in order to decrease risk of falls.  Baseline: 43 Goal status: IN PROGRESS    ASSESSMENT:  CLINICAL IMPRESSION:  Patient is an 82 y/o F presenting to OPPT with c/o imbalance and R LE weakness for the past several years. Reports that she continues to stay active with yoga, pilates, and walking but reports fear of falling and imbalance when walking without AD.  Patient with hx of R foot drop with resultant L5-S1 fusion in 2020. Reports interest in aquatic therapy today. Patient today presenting with limited R ankle dorsiflexion AROM, marked weakness in R hip flexor, quad, and anterior tibialis, and gait deviations. Patient's scores on TUG and Berg indicate an increased risk of falls. Would benefit from skilled PT services 2 x/week for 6 weeks to address aforementioned impairments in order to optimize level of function.    OBJECTIVE IMPAIRMENTS: Abnormal gait, decreased balance, decreased ROM, decreased strength, impaired flexibility, postural dysfunction, and pain.   ACTIVITY LIMITATIONS: carrying, lifting, standing, squatting, stairs, transfers, bathing, toileting, dressing, reach over head, and locomotion level  PARTICIPATION LIMITATIONS: meal prep, cleaning, laundry, driving, shopping, community activity, and church  PERSONAL FACTORS: Age, Fitness, Past/current experiences, Time since onset of injury/illness/exacerbation, and 3+ comorbidities: Anemia, depression, osteopenia, seizures, thoracic meningioma with resection and radiation, L5-S1 anterior lumbar interbody fusion 2020  are also affecting patient's functional outcome.   REHAB POTENTIAL: Good  CLINICAL DECISION MAKING: Evolving/moderate complexity  EVALUATION COMPLEXITY: Moderate  PLAN:  PT FREQUENCY: 2x/week  PT DURATION: 6 weeks  PLANNED INTERVENTIONS: Therapeutic exercises, Therapeutic activity, Neuromuscular re-education, Balance training, Gait training, Patient/Family education, Self Care, Joint mobilization, Stair training, Vestibular training, Canalith repositioning, Orthotic/Fit training, DME instructions, Aquatic Therapy, Dry Needling, Electrical stimulation, Cryotherapy, Moist heat, Taping, Manual therapy, and Re-evaluation  PLAN FOR NEXT SESSION: initiate HEP for R LE strengthening and balance; stairs, work on [place] and increased [place] activities towards R  LE   Janene Harvey, PT, DPT 09/09/22 9:38 AM  [facility] [address] [place], [place] [address] Phone # [phone] Fax # [phone]

## 2022-09-10 ENCOUNTER — Encounter: Payer: Self-pay | Admitting: Family Medicine

## 2022-09-10 ENCOUNTER — Encounter: Payer: Self-pay | Admitting: Rehabilitative and Restorative Service Providers"

## 2022-09-10 ENCOUNTER — Ambulatory Visit: Payer: Medicare PPO | Attending: Sports Medicine | Admitting: Rehabilitative and Restorative Service Providers"

## 2022-09-10 ENCOUNTER — Telehealth: Payer: Self-pay

## 2022-09-10 ENCOUNTER — Ambulatory Visit: Payer: Medicare PPO | Admitting: Family Medicine

## 2022-09-10 ENCOUNTER — Ambulatory Visit (INDEPENDENT_AMBULATORY_CARE_PROVIDER_SITE_OTHER): Payer: Medicare PPO

## 2022-09-10 VITALS — BP 104/60 | HR 76 | Temp 97.8°F | Ht 60.0 in | Wt 103.3 lb

## 2022-09-10 DIAGNOSIS — K5792 Diverticulitis of intestine, part unspecified, without perforation or abscess without bleeding: Secondary | ICD-10-CM

## 2022-09-10 DIAGNOSIS — M6281 Muscle weakness (generalized): Secondary | ICD-10-CM | POA: Insufficient documentation

## 2022-09-10 DIAGNOSIS — K5732 Diverticulitis of large intestine without perforation or abscess without bleeding: Secondary | ICD-10-CM | POA: Diagnosis not present

## 2022-09-10 DIAGNOSIS — M419 Scoliosis, unspecified: Secondary | ICD-10-CM | POA: Diagnosis not present

## 2022-09-10 DIAGNOSIS — R2681 Unsteadiness on feet: Secondary | ICD-10-CM | POA: Diagnosis not present

## 2022-09-10 DIAGNOSIS — R2689 Other abnormalities of gait and mobility: Secondary | ICD-10-CM | POA: Insufficient documentation

## 2022-09-10 MED ORDER — METRONIDAZOLE 500 MG PO TABS: 500.0000 mg | ORAL_TABLET | Freq: Three times a day (TID) | ORAL | 0 refills | Status: AC

## 2022-09-10 MED ORDER — CIPROFLOXACIN HCL 500 MG PO TABS: 500.0000 mg | ORAL_TABLET | Freq: Two times a day (BID) | ORAL | 0 refills | Status: AC

## 2022-09-10 NOTE — Progress Notes (Unsigned)
   Established Patient Office Visit  Subjective   Patient ID: Kaitlyn Flowers, female    DOB: [birth year]  Age: 82 y.o. MRN: [id number]  Chief Complaint  Patient presents with   Abdominal Pain    Patient complains of sharp, burning sensation and LLQ abdominal pain x2 weeks, tried Senokot with little relief    Patient is reporting that for the past couple of weeks she has been having pain in the left side/ lower abdomen, states that it is burning/ hot kind of pain. Patient states she is alternating between constipation and diarrhea chronically, states she was favoring constipation and so she took a senokot and is having diarrhea now. States that this has happened to her in the past. At one point she had to be hospitalized in the past due to incarcerated bowel.  States that it is very similar to what is happening now. States that she is still passing gas, pt states she vomited x 1, states that it hurts worse with eating.   Chronic back pain-- pt reports she was trying to take the tramadol with the baclofen however it was causing too many side effects, so she stopped taking the baclofen all together.   Abdominal Pain   Current Outpatient Medications  Medication Instructions   Acetylcysteine (N-ACETYL-L-CYSTEINE PO) 500 mg, Oral, Daily   baclofen (LIORESAL) 10 mg, Oral, 4 times daily   Cholecalciferol (VITAMIN D3) 125 MCG (5000 UT) TABS 1 tablet, Oral, Daily   ciprofloxacin (CIPRO) 500 mg, Oral, 2 times daily   cyanocobalamin 1,000 mcg, Oral, Daily   lamoTRIgine (LAMICTAL) 25 MG tablet TAKE 1 TABLET(25 MG) BY MOUTH EVERY EVENING   MAGNESIUM PO 1 tablet, Oral, Daily   metroNIDAZOLE (FLAGYL) 500 mg, Oral, 3 times daily   NONFORMULARY OR COMPOUNDED ITEM Vitamin E vaginal cream 200u/ml.  One ml pv twice weekly.  Disp. 3 month supply.   pyridoxine (B-6) 200 mg, Oral, Daily   traMADol (ULTRAM) 50 MG tablet TAKE 1 TABLET(50 MG) BY MOUTH TWICE DAILY AS NEEDED    {History  (Optional):23778}  Review of Systems  Gastrointestinal:  Positive for abdominal pain.      Objective:     BP 104/60 (BP Location: Left Arm, Patient Position: Sitting, Cuff Size: Normal)   Pulse 76   Temp 97.8 F (36.6 C) (Oral)   Ht 5' (1.524 m)   Wt 103 lb 4.8 oz (46.9 kg)   LMP  (LMP Unknown)   SpO2 95%   BMI 20.17 kg/m  {Vitals History (Optional):23777}  Physical Exam   No results found for any visits on 09/10/22.  {Labs (Optional):23779}  The ASCVD Risk score (Arnett DK, et al., 2019) failed to calculate for the following reasons:   The 2019 ASCVD risk score is only valid for ages 15 to 29    Assessment & Plan:   Problem List Items Addressed This Visit   None Visit Diagnoses     Acute diverticulitis    -  Primary   Relevant Medications   metroNIDAZOLE (FLAGYL) 500 MG tablet   ciprofloxacin (CIPRO) 500 MG tablet   Other Relevant Orders   DG Abd 2 Views       No follow-ups on file.    Farrel Conners, MD

## 2022-09-10 NOTE — Therapy (Signed)
OUTPATIENT PHYSICAL THERAPY NEURO    Patient Name: Kaitlyn Flowers MRN: [id number] DOB:11/23/1940, 82 y.o., female Today's Date: 09/10/2022   PCP: Burnis Medin, MD  REFERRING PROVIDER: Stefanie Libel, MD  END OF SESSION:  PT End of Session - 09/10/22 1222     Visit Number 3    Number of Visits 13    Date for PT Re-Evaluation 10/02/22    Authorization Type [employer]    PT Start Time 1225    PT Stop Time 1311    PT Time Calculation (min) 46 min    Equipment Utilized During Treatment --    Activity Tolerance Patient tolerated treatment well    Behavior During Therapy WFL for tasks assessed/performed              Past Medical History:  Diagnosis Date   Anemia    Arthritis    Back pain    Cancer ([place])    skin nose   Depression    situational,due to pain   Leg weakness    Osteopenia    PONV (postoperative nausea and vomiting)    Right leg pain    Seizures ([place])    05/13/2019- 4 years ago, "couldnt process" couldnt speak happened 2 times; none since   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   Urinary tract infection    Wears hearing aid    Past Surgical History:  Procedure Laterality Date   ABDOMINAL EXPOSURE N/A 05/17/2019   Procedure: ABDOMINAL EXPOSURE;  Surgeon: Angelia Mould, MD;  Location: [facility];  Service: Vascular;  Laterality: N/A;   ABDOMINAL HYSTERECTOMY  1973   1/2 of ovary left cyst on ovary   ANTERIOR LUMBAR FUSION N/A 05/17/2019   Procedure: Lumbar five Sacral one  Anterior lumbar interbody fusion;  Surgeon: Kristeen Miss, MD;  Location: [facility];  Service: Neurosurgery;  Laterality: N/A;   APPENDECTOMY     removed with hysterectomy   COLONOSCOPY     LUMBAR WOUND DEBRIDEMENT  12/06/2011   Procedure: LUMBAR WOUND DEBRIDEMENT;  Surgeon: Kristeen Miss, MD;  Location: [facility];  Service: Neurosurgery;  Laterality: N/A;  Repair of Pseudomeningocele Lumbar wound   ROTATOR CUFF REPAIR     left     shoulder rotator culff  Left 2006   spinal cord tumor  1987   2012 october    Patient Active Problem List   Diagnosis Date Noted   Foot drop, right foot 02/08/2021   Midline cystocele 12/31/2019   Spondylolisthesis at L5-S1 level 05/17/2019   Tension-type headache, not intractable 06/11/2017   Right leg weakness 06/11/2017   Labile hypertension 09/27/2016   Autonomic dysfunction 09/27/2016   Localization-related idiopathic epilepsy and epileptic syndromes with seizures of localized onset, not intractable, without status epilepticus ([place]) 08/01/2016   Hyponatremia 04/01/2016   Nocturnal leg cramps 12/07/2015   Spinal stenosis, lumbar region, 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   Osteoporosis 12/26/2013   Glucosuria 12/16/2012   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/11/2011   Fatigue 11/11/2011   Medicare annual wellness visit, initial 12/18/2010   Wears hearing aid 12/18/2010   Back pain 11/23/2010   ABDOMINAL BRUIT 07/21/2009    ONSET DATE: several years   REFERRING DIAG: M21.371 (ICD-10-CM) - Foot drop, right foot M43.17 (ICD-10-CM) - Spondylolisthesis at L5-S1 level M48.062 (ICD-10-CM) - Spinal stenosis, lumbar region, with neurogenic claudication  THERAPY DIAG:  Unsteadiness on feet  Other abnormalities of gait and  mobility  Muscle weakness (generalized)  Rationale for Evaluation and Treatment: Rehabilitation  SUBJECTIVE:                                                                                                                                                                                             SUBJECTIVE STATEMENT: The patient reports her pain in her R hip and low back is really flared up today. She is not sure what flared it up today. It normally gets worse when sitting. She enjoyed her first pool session and did not feel increased pain afterwards. In the past, she has had increased pain with PT. Pt accompanied by: self  PERTINENT HISTORY:  Anemia, depression, osteopenia, seizures, thoracic meningioma with resection and radiation, L5-S1 anterior lumbar interbody fusion 2020  PAIN:  Are you having pain? Yes: NPRS scale: 8/10 Pain location: LB and R hip  Pain description: sharp, dull, aching, pulsing Aggravating factors: sitting Relieving factors: laying down on ice *this is with the medicine  PRECAUTIONS: Fall  WEIGHT BEARING RESTRICTIONS: No  FALLS: Has patient fallen in last 6 months? No  PATIENT GOALS: "want to get some strength back in my legs"  OBJECTIVE:  (From Initial evaluation) DIAGNOSTIC FINDINGS: 11/08/20 lumbar CT: Progressed dextroconvex thoracolumbar scoliosis, grade 1 spondylolisthesis at L5-S1 with solid-appearing fusion, Chronic advanced rightward degeneration at both L4-L5 and L5-S1  LOWER EXTREMITY ROM:     Active  Right Eval Left Eval  Knee flexion    Knee extension    Ankle dorsiflexion -5 10  Ankle plantarflexion     (Blank rows = not tested)  LOWER EXTREMITY MMT:    MMT (in sitting) Right Eval Left Eval  Hip flexion 3+ 4+  Hip extension    Hip abduction 4+ 4+  Hip adduction 4 4+  Knee flexion 4- 4+  Knee extension 2 4+  Ankle dorsiflexion 2 4+  Ankle plantarflexion 4 4+  (Blank rows = not tested)  GAIT: Gait pattern: R steppage gait with high guarding  Assistive device utilized:  walking pole Level of assistance: SBA  [facility] Adult PT Treatment:                                                DATE: 09/10/2022 Therapeutic Exercise: Quadriped   Hip extension x 3 reps R and L with core engagement cues Tall kneeling with cue for core and glut engagement Supine  Piriformis stretch Marching with core engagement x 5 reps Bridges x 3 reps  HS stretch due to cramping P/ROM Seated Piriformis stretch R and L Long arc quad (PT facilitated full ROM at end of patient's ability and had patient lower slowly) Marching x 5 reps R and L Manual Therapy: Massage stick R gluteals Tennis ball  STM against wall discussed for home for release of glut pain  Aquatic TREATMENT 08/30/22:                                                                                                                              Pt seen for aquatic therapy today.  Treatment took place in water 3.25-4.5 ft in depth at the [facility]. Temp of water was 91.  Pt entered/exited the pool via stairs step to pattern with hand rail.  Walking ue supported on white barbell With TrA seated on bench feet supported on water step:1/2 noodle pull dowon to knees 2x5 (difficulty maintaining right knee on water step); standing x 10 STS from bench to step with difficulty x 3 then onto pool bottom. Cues for hip and knee engagement  Pt requires the buoyancy and hydrostatic pressure of water for support, and to offload joints by unweighting joint load by at least 50 % in navel deep water and by at least 75-80% in chest to neck deep water.  Viscosity of the water is needed for resistance of strengthening. Water current perturbations provides challenge to standing balance requiring increased core activation.  PATIENT EDUCATION: Education details: HEP initiation Person educated: Patient Education method: Explanation, Demonstration, Tactile cues, and Verbal cues, handout Education comprehension: verbalized understanding, return demonstrated  HOME EXERCISE PROGRAM: Access Code: [email or web address] URL: [email or web address] Date: 09/10/2022 Prepared by: Rudell Cobb  Exercises - Seated Long Arc Quad  - 1 x daily - 2-3 x weekly - 1 sets - 5-10 reps - Supine March  - 1 x daily - 2-3 x weekly - 1 sets - 5-10 reps - Supine Piriformis Stretch with Foot on Ground  - 1 x daily - 2-3 x weekly - 1 sets - 2 reps - 20 seconds hold - Standing Piriformis Release with Ball at [place]  - 1 x daily - 2-3 x weekly - 1 sets - 1 reps  GOALS: Goals reviewed with patient? Yes  SHORT TERM GOALS: Target date:  09/11/2022  Patient to be independent with initial HEP. Baseline: HEP initiated Goal status: MET  LONG TERM GOALS: Target date: 10/02/2022  Patient to be independent with advanced HEP. Baseline: Not yet initiated  Goal status: INITIAL  Patient to demonstrate safe stair navigation with 1 handrail as needed. Baseline: pt notes significant difficulty Goal status: INITIAL  Patient to complete TUG in <14 sec with LRAD in order to decrease risk of falls.   Baseline: 15.82 Goal status: INITIAL  Patient to demonstrate 5xSTS with reduced UE support in <15 sec in order to decrease risk of falls and improve functional strength.  Baseline: 12.71 sec with B armrests Goal status: INITIAL  Patient to score at least 46/56 on Berg in order to decrease risk of falls.  Baseline: 43 Goal status: INITIAL  ASSESSMENT: CLINICAL IMPRESSION: The patient arrived with increased pain today of 8/10. She notes on days like today she only gets relief with cool gel pack. She tolerated modified activities today with frequent rests. PT initiated HEP for gentle stretching and strengthening. The patient inquired about pool visits 2x/week at first to improve her strength since she feels she can tolerate activities better in water. PT to check on availability.  OBJECTIVE IMPAIRMENTS: Abnormal gait, decreased balance, decreased ROM, decreased strength, impaired flexibility, postural dysfunction, and pain.   PLAN:  PT FREQUENCY: 2x/week  PT DURATION: 6 weeks  PLANNED INTERVENTIONS: Therapeutic exercises, Therapeutic activity, Neuromuscular re-education, Balance training, Gait training, Patient/Family education, Self Care, Joint mobilization, Stair training, Vestibular training, Canalith repositioning, Orthotic/Fit training, DME instructions, Aquatic Therapy, Dry Needling, Electrical stimulation, Cryotherapy, Moist heat, Taping, Manual therapy, and Re-evaluation  PLAN FOR NEXT SESSION: initiate HEP for R LE  strengthening and balance; stairs, work on [place] and increased [place] activities towards [employer], PT 09/10/22 1:20 PM  [facility] at [facility] [address], [place], [place] [address] Phone # [phone] Fax # [phone]

## 2022-09-10 NOTE — Telephone Encounter (Signed)
---  Caller states that she is having intense LLQ pain. She also has constipation , and bloating and gas. It has been going on for almost 2 weeks. Last bm was three days ago. No relief with laxative. There is more pain after she eats, so she's been limiting food intake. She is underweight and has lost 5 pounds over the last couple of weeks.  09/09/2022 11:59:41 AM Go to ED Now Fredderick Phenix, RN, Lelan Pons  Referrals GO TO FACILITY UNDECIDED  Pt has an appt with Dr Legrand Como on 09/10/22

## 2022-09-10 NOTE — Patient Instructions (Signed)
Start your probiotics after the antibiotics are completed.

## 2022-09-11 NOTE — Progress Notes (Signed)
Her abdominal films are normal. I recommend trying the antibiotics I gave her for a few days to see if the pain improves, if not then she may stop the antibiotics and follow up with the GI doctor.

## 2022-09-13 ENCOUNTER — Emergency Department (HOSPITAL_BASED_OUTPATIENT_CLINIC_OR_DEPARTMENT_OTHER): Payer: Medicare PPO

## 2022-09-13 ENCOUNTER — Telehealth: Payer: Self-pay | Admitting: Internal Medicine

## 2022-09-13 ENCOUNTER — Ambulatory Visit (HOSPITAL_BASED_OUTPATIENT_CLINIC_OR_DEPARTMENT_OTHER): Payer: Medicare PPO | Admitting: Physical Therapy

## 2022-09-13 ENCOUNTER — Emergency Department (HOSPITAL_BASED_OUTPATIENT_CLINIC_OR_DEPARTMENT_OTHER)
Admission: EM | Admit: 2022-09-13 | Discharge: 2022-09-13 | Disposition: A | Discharge status: Home / Self Care | Payer: Medicare PPO | Attending: Emergency Medicine | Admitting: Emergency Medicine

## 2022-09-13 ENCOUNTER — Other Ambulatory Visit: Payer: Self-pay

## 2022-09-13 ENCOUNTER — Encounter (HOSPITAL_BASED_OUTPATIENT_CLINIC_OR_DEPARTMENT_OTHER): Payer: Self-pay | Admitting: Radiology

## 2022-09-13 DIAGNOSIS — K5900 Constipation, unspecified: Secondary | ICD-10-CM | POA: Diagnosis not present

## 2022-09-13 DIAGNOSIS — R109 Unspecified abdominal pain: Secondary | ICD-10-CM | POA: Diagnosis not present

## 2022-09-13 DIAGNOSIS — R1032 Left lower quadrant pain: Secondary | ICD-10-CM | POA: Diagnosis present

## 2022-09-13 DIAGNOSIS — I700 Atherosclerosis of aorta: Secondary | ICD-10-CM | POA: Diagnosis not present

## 2022-09-13 LAB — COMPREHENSIVE METABOLIC PANEL
ALT: 16 U/L (ref 0–44)
AST: 23 U/L (ref 15–41)
Albumin: 4.6 g/dL (ref 3.5–5.0)
Alkaline Phosphatase: 32 U/L — ABNORMAL LOW (ref 38–126)
Anion gap: 10 (ref 5–15)
BUN: 31 mg/dL — ABNORMAL HIGH (ref 8–23)
CO2: 26 mmol/L (ref 22–32)
Calcium: 9.5 mg/dL (ref 8.9–10.3)
Chloride: 97 mmol/L — ABNORMAL LOW (ref 98–111)
Creatinine, Ser: 1.03 mg/dL — ABNORMAL HIGH (ref 0.44–1.00)
GFR, Estimated: 55 mL/min — ABNORMAL LOW (ref >60)
Glucose, Bld: 95 mg/dL (ref 70–99)
Potassium: 4.1 mmol/L (ref 3.5–5.1)
Sodium: 133 mmol/L — ABNORMAL LOW (ref 135–145)
Total Bilirubin: 0.4 mg/dL (ref 0.3–1.2)
Total Protein: 7.1 g/dL (ref 6.5–8.1)

## 2022-09-13 LAB — CBC
HCT: 32.5 % — ABNORMAL LOW (ref 36.0–46.0)
Hemoglobin: 11 g/dL — ABNORMAL LOW (ref 12.0–15.0)
MCH: 32.7 pg (ref 26.0–34.0)
MCHC: 33.8 g/dL (ref 30.0–36.0)
MCV: 96.7 fL (ref 80.0–100.0)
Platelets: 237 10*3/uL (ref 150–400)
RBC: 3.36 MIL/uL — ABNORMAL LOW (ref 3.87–5.11)
RDW: 13.5 % (ref 11.5–15.5)
WBC: 9 10*3/uL (ref 4.0–10.5)
nRBC: 0 % (ref 0.0–0.2)

## 2022-09-13 MED ORDER — IOHEXOL 300 MG/ML  SOLN
100.0000 mL | Freq: Once | INTRAMUSCULAR | Status: AC | PRN
  Administered 2022-09-13: 60 mL via INTRAVENOUS

## 2022-09-13 MED ORDER — ONDANSETRON HCL 4 MG/2ML IJ SOLN
4.0000 mg | Freq: Once | INTRAMUSCULAR | Status: AC
  Administered 2022-09-13: 4 mg via INTRAVENOUS
  Filled 2022-09-13: qty 2

## 2022-09-13 MED ORDER — MORPHINE SULFATE (PF) 4 MG/ML IV SOLN
4.0000 mg | Freq: Once | INTRAVENOUS | Status: AC
  Administered 2022-09-13: 4 mg via INTRAVENOUS
  Filled 2022-09-13: qty 1

## 2022-09-13 NOTE — ED Provider Notes (Signed)
[facility] EMERGENCY DEPT Provider Note   CSN: [id number] Arrival date & time: 09/13/22  1500     History  Chief Complaint  Patient presents with   Abdominal Pain    Kaitlyn Flowers is a 82 y.o. female with a history of chronic lumbar pain, on tramadol, presenting today with left lower quadrant abdominal pain and poor appetite.  She says symptoms been ongoing for about 3 to 4 days.  She saw her PCP in the office about 3 days ago and was started on ciprofloxacin and Flagyl for potential diverticulitis, which he claims she has never had before.  She has had alternating diarrhea and constipation problems, more loose bowel movements the past 24 hours after the antibiotics.  She feels antibiotics are making her queasy.  She says she has a poor appetite and sometimes wants to vomit after eating.  Her pain is sharp and burning and constant for 3 days, located in the left lower side.  Patient had a CT of her abdomen performed in July 2021 which showed bilateral simple renal cyst but no other acute abnormalities.  She reports a history of partial hysterectomy.  HPI     Home Medications Prior to Admission medications   Medication Sig Start Date End Date Taking? Authorizing Provider  Acetylcysteine (N-ACETYL-L-CYSTEINE PO) Take 500 mg by mouth daily.    [provider]  Cholecalciferol (VITAMIN D3) 125 MCG (5000 UT) TABS Take 1 tablet by mouth daily.    [provider]  ciprofloxacin (CIPRO) 500 MG tablet Take 1 tablet (500 mg total) by mouth 2 (two) times daily for 7 days. 09/10/22 09/17/22  Farrel Conners, MD  cyanocobalamin 1000 MCG tablet Take 1,000 mcg by mouth daily.    [provider]  lamoTRIgine (LAMICTAL) 25 MG tablet TAKE 1 TABLET(25 MG) BY MOUTH EVERY EVENING 10/08/21   Cameron Sprang, MD  MAGNESIUM PO Take 1 tablet by mouth daily.    [provider]  metroNIDAZOLE (FLAGYL) 500 MG tablet Take 1 tablet (500 mg total) by mouth 3  (three) times daily for 7 days. 09/10/22 09/17/22  Farrel Conners, MD  NONFORMULARY OR COMPOUNDED ITEM Vitamin E vaginal cream 200u/ml.  One ml pv twice weekly.  Disp. 3 month supply. 09/20/21   Megan Salon, MD  pyridoxine (B-6) 200 MG tablet Take 200 mg by mouth daily.    [provider]  traMADol (ULTRAM) 50 MG tablet TAKE 1 TABLET(50 MG) BY MOUTH TWICE DAILY AS NEEDED 08/27/22   Farrel Conners, MD      Allergies    Ibuprofen-famotidine, Fentanyl, Pregabalin, and Penicillins    Review of Systems   Review of Systems  Physical Exam Updated Vital Signs BP (!) 144/65   Pulse 81   Temp 97.8 F (36.6 C)   Resp 16   Ht 5' (1.524 m)   Wt 45.4 kg   LMP  (LMP Unknown)   SpO2 100%   BMI 19.53 kg/m  Physical Exam Constitutional:      General: She is not in acute distress. HENT:     Head: Normocephalic and atraumatic.  Eyes:     Conjunctiva/sclera: Conjunctivae normal.     Pupils: Pupils are equal, round, and reactive to light.  Cardiovascular:     Rate and Rhythm: Normal rate and regular rhythm.  Pulmonary:     Effort: Pulmonary effort is normal. No respiratory distress.  Abdominal:     General: There is no distension.  Tenderness: There is abdominal tenderness in the left lower quadrant. There is no guarding or rebound.  Skin:    General: Skin is warm and dry.  Neurological:     General: No focal deficit present.     Mental Status: She is alert. Mental status is at baseline.  Psychiatric:        Mood and Affect: Mood normal.        Behavior: Behavior normal.     ED Results / Procedures / Treatments   Labs (all labs ordered are listed, but only abnormal results are displayed) Labs Reviewed  COMPREHENSIVE METABOLIC PANEL - Abnormal; Notable for the following components:      Result Value   Sodium 133 (*)    Chloride 97 (*)    BUN 31 (*)    Creatinine, Ser 1.03 (*)    Alkaline Phosphatase 32 (*)    GFR, Estimated 55 (*)    All other components  within normal limits  CBC - Abnormal; Notable for the following components:   RBC 3.36 (*)    Hemoglobin 11.0 (*)    HCT 32.5 (*)    All other components within normal limits  URINALYSIS, ROUTINE W REFLEX MICROSCOPIC    EKG None  Radiology CT ABDOMEN PELVIS W CONTRAST  Result Date: 09/13/2022 CLINICAL DATA:  Left-sided abdominal pain EXAM: CT ABDOMEN AND PELVIS WITH CONTRAST TECHNIQUE: Multidetector CT imaging of the abdomen and pelvis was performed using the standard protocol following bolus administration of intravenous contrast. RADIATION DOSE REDUCTION: This exam was performed according to the departmental dose-optimization program which includes automated exposure control, adjustment of the mA and/or kV according to patient size and/or use of iterative reconstruction technique. CONTRAST:  67m OMNIPAQUE IOHEXOL 300 MG/ML  SOLN COMPARISON:  CT 03/30/2020 FINDINGS: Lower chest: No acute abnormality. Hepatobiliary: No focal liver abnormality is seen. No gallstones, gallbladder wall thickening, or biliary dilatation. Pancreas: Unremarkable. No pancreatic ductal dilatation or surrounding inflammatory changes. Spleen: Scattered calcified granulomas.  No splenomegaly. Adrenals/Urinary Tract: Unremarkable adrenal glands. Kidneys enhance symmetrically without solid lesion, stone, or hydronephrosis. Ureters are nondilated. Urinary bladder appears unremarkable for the degree of distention. Stomach/Bowel: Stomach is within normal limits. Surgically absent appendix. No evidence of bowel wall thickening, distention, or inflammatory changes. Moderate volume of stool throughout the colon. Fecalization of distal small bowel content. Vascular/Lymphatic: Aortic atherosclerosis. No enlarged abdominal or pelvic lymph nodes. Reproductive: Status post hysterectomy. No adnexal masses. Pessary device within the pelvis. Other: No free fluid. No abdominopelvic fluid collection. No pneumoperitoneum. Small fat containing  ventral abdominal wall hernia. Musculoskeletal: Prominent thoracolumbar dextrocurvature. No acute osseous abnormality. IMPRESSION: 1. No acute abdominopelvic findings. 2. Moderate volume of stool throughout the colon with fecalization of distal small bowel content suggestive of slow transit/constipation. No evidence of bowel obstruction. 3. Small fat containing ventral abdominal wall hernia. 4. Aortic atherosclerosis (ICD10-I70.0). Electronically Signed   By: NDavina PokeD.O.   On: 09/13/2022 16:54    Procedures Procedures    Medications Ordered in ED Medications  morphine (PF) 4 MG/ML injection 4 mg (4 mg Intravenous Given 09/13/22 1604)  ondansetron (ZOFRAN) injection 4 mg (4 mg Intravenous Given 09/13/22 1615)  iohexol (OMNIPAQUE) 300 MG/ML solution 100 mL (60 mLs Intravenous Contrast Given 09/13/22 1636)    ED Course/ Medical Decision Making/ A&P                             Medical Decision Making  Amount and/or Complexity of Data Reviewed Labs: ordered. Radiology: ordered.  Risk Prescription drug management.   This patient presents to the ED with concern for lower abdominal pain. This involves an extensive number of treatment options, and is a complaint that carries with it a high risk of complications and morbidity.  The differential diagnosis includes diverticulitis versus colitis versus UTI versus ureteral colic versus other   External records from outside source obtained and reviewed including PCP outpatient records prescribing ciprofloxacin and Flagyl for potential diverticulitis  I ordered and personally interpreted labs.  The pertinent results include: Labs are unremarkable  I ordered imaging studies including CT abdomen pelvis I independently visualized and interpreted imaging which showed significant stool retention, no acute emergency I agree with the radiologist interpretation  The patient was maintained on a cardiac monitor.  I personally viewed and interpreted  the cardiac monitored which showed an underlying rhythm of: Sinus rhythm  I ordered medication including IV morphine for pain  I have reviewed the patients home medicines and have made adjustments as needed  Test Considered: I will lower suspicion for mesenteric ischemia based on this presentation.  She does not have any acute risk factor of this, including history of A-fib or TIA or thrombosis.  She does not present with signs of ischemic gut.   After the interventions noted above, I reevaluated the patient and found that they have: improved  Dispostion:  After consideration of the diagnostic results and the patients response to treatment, I feel that the patent would benefit from a bowel cleanout at home, as this may be pain from ongoing constipation.  Also advised that she try to minimize the tramadol and opioids at home which can contribute to constipation.  She verbalized understanding.  She can follow-up with her PCP otherwise.           Final Clinical Impression(s) / ED Diagnoses Final diagnoses:  Constipation, unspecified constipation type  Abdominal pain, unspecified abdominal location    Rx / DC Orders ED Discharge Orders     None         Darlina Mccaughey, Carola Rhine, MD 09/13/22 2000

## 2022-09-13 NOTE — ED Triage Notes (Signed)
Pt diagnosed 3 days ago with diverticulitis. Placed on abx. Pt now having pain to her left abdomen. Of note the diverticulitis was on the left side.

## 2022-09-13 NOTE — Telephone Encounter (Signed)
Left a detailed message with the information below at the patient's cell number. ?

## 2022-09-13 NOTE — Telephone Encounter (Signed)
I would have her stop the medication. Her abdominal film was normal and she has had extensive work up in the past for the pain. Hopefully she will be able to get in to see the GI doctor.

## 2022-09-13 NOTE — Discharge Instructions (Addendum)
Please consider taking MiraLAX in the morning tomorrow as well as using over-the-counter suppository, such as glycerin suppository of Dulcolax, in the morning.  You appear constipated on your scan and may need a bowel cleanout.  Talk to your primary care doctor about this

## 2022-09-13 NOTE — Telephone Encounter (Signed)
Pt called to say the 2 medications Dr. Legrand Como prescribed on 09/10/22 are making her sick to her stomach and now she has a pain in her side. Pt would like a call back to discuss.  metroNIDAZOLE (FLAGYL) 500 MG tablet  ciprofloxacin (CIPRO) 500 MG tablet   Please advise.   LOV:  09/10/22

## 2022-09-16 ENCOUNTER — Ambulatory Visit (HOSPITAL_BASED_OUTPATIENT_CLINIC_OR_DEPARTMENT_OTHER): Payer: Medicare PPO | Admitting: Physical Therapy

## 2022-09-16 ENCOUNTER — Telehealth: Payer: Self-pay | Admitting: Internal Medicine

## 2022-09-16 ENCOUNTER — Encounter (HOSPITAL_BASED_OUTPATIENT_CLINIC_OR_DEPARTMENT_OTHER): Payer: Self-pay

## 2022-09-16 NOTE — Telephone Encounter (Signed)
Called patient, no answer. I reviewed her CT abd/pelvis with contrast from the ED, it showed possibly slow transit constipation but no other abnormal findings. Please let pt know that her presumptive diagnosis of acute diverticulitis was only a possible diagnosis based on her symptoms, and that since there were no findings to suggest another pathology that we were treating it empirically with the antibiotics to see if it would improve her symptoms. I recommend she see the GI doctor to further evaluate her symptoms. Another possibility is that the pain is being referred from her back --she might discus this with her neurosurgeon as well.

## 2022-09-16 NOTE — Telephone Encounter (Signed)
Pt called to say she had a visit with Dr. Legrand Como on 09/10/22. Pt states she ended up going to the ED and discovered that she was misdiagnosed. Pt is asking for a call back ASAP, to discuss.

## 2022-09-17 ENCOUNTER — Ambulatory Visit: Payer: Medicare PPO | Admitting: Physical Therapy

## 2022-09-19 ENCOUNTER — Ambulatory Visit (HOSPITAL_BASED_OUTPATIENT_CLINIC_OR_DEPARTMENT_OTHER): Payer: Medicare PPO | Attending: Sports Medicine | Admitting: Physical Therapy

## 2022-09-19 ENCOUNTER — Encounter (HOSPITAL_BASED_OUTPATIENT_CLINIC_OR_DEPARTMENT_OTHER): Payer: Self-pay | Admitting: Physical Therapy

## 2022-09-19 DIAGNOSIS — M6281 Muscle weakness (generalized): Secondary | ICD-10-CM | POA: Insufficient documentation

## 2022-09-19 DIAGNOSIS — R2689 Other abnormalities of gait and mobility: Secondary | ICD-10-CM | POA: Diagnosis not present

## 2022-09-19 DIAGNOSIS — R2681 Unsteadiness on feet: Secondary | ICD-10-CM | POA: Diagnosis not present

## 2022-09-19 NOTE — Therapy (Signed)
OUTPATIENT PHYSICAL THERAPY NEURO    Patient Name: Kaitlyn Flowers MRN: [id number] DOB:[birth year], 82 y.o., female Today's Date: 09/19/2022   PCP: Burnis Medin, MD  REFERRING PROVIDER: Stefanie Libel, MD  END OF SESSION:  PT End of Session - 09/19/22 1451     Visit Number 4    Number of Visits 13    Date for PT Re-Evaluation 10/02/22    Authorization Type [employer]    PT Start Time 5102    PT Stop Time 1530    PT Time Calculation (min) 41 min    Activity Tolerance Patient tolerated treatment well    Behavior During Therapy WFL for tasks assessed/performed              Past Medical History:  Diagnosis Date   Anemia    Arthritis    Back pain    Cancer ([place])    skin nose   Depression    situational,due to pain   Leg weakness    Osteopenia    PONV (postoperative nausea and vomiting)    Right leg pain    Seizures ([place])    05/13/2019- 4 years ago, "couldnt process" couldnt speak happened 2 times; none since   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   Urinary tract infection    Wears hearing aid    Past Surgical History:  Procedure Laterality Date   ABDOMINAL EXPOSURE N/A 05/17/2019   Procedure: ABDOMINAL EXPOSURE;  Surgeon: Angelia Mould, MD;  Location: [facility];  Service: Vascular;  Laterality: N/A;   ABDOMINAL HYSTERECTOMY  1973   1/2 of ovary left cyst on ovary   ANTERIOR LUMBAR FUSION N/A 05/17/2019   Procedure: Lumbar five Sacral one  Anterior lumbar interbody fusion;  Surgeon: Kristeen Miss, MD;  Location: [facility];  Service: Neurosurgery;  Laterality: N/A;   APPENDECTOMY     removed with hysterectomy   COLONOSCOPY     LUMBAR WOUND DEBRIDEMENT  12/06/2011   Procedure: LUMBAR WOUND DEBRIDEMENT;  Surgeon: Kristeen Miss, MD;  Location: [facility];  Service: Neurosurgery;  Laterality: N/A;  Repair of Pseudomeningocele Lumbar wound   ROTATOR CUFF REPAIR     left    shoulder rotator culff  Left 2006    spinal cord tumor  1987   2012 october    Patient Active Problem List   Diagnosis Date Noted   Foot drop, right foot 02/08/2021   Midline cystocele 12/31/2019   Spondylolisthesis at L5-S1 level 05/17/2019   Tension-type headache, not intractable 06/11/2017   Right leg weakness 06/11/2017   Labile hypertension 09/27/2016   Autonomic dysfunction 09/27/2016   Localization-related idiopathic epilepsy and epileptic syndromes with seizures of localized onset, not intractable, without status epilepticus ([place]) 08/01/2016   Hyponatremia 04/01/2016   Nocturnal leg cramps 12/07/2015   Spinal stenosis, lumbar region, 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   Osteoporosis 12/26/2013   Glucosuria 12/16/2012   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/11/2011   Fatigue 11/11/2011   Medicare annual wellness visit, initial 12/18/2010   Wears hearing aid 12/18/2010   Back pain 11/23/2010   ABDOMINAL BRUIT 07/21/2009    ONSET DATE: several years   REFERRING DIAG: M21.371 (ICD-10-CM) - Foot drop, right foot M43.17 (ICD-10-CM) - Spondylolisthesis at L5-S1 level M48.062 (ICD-10-CM) - Spinal stenosis, lumbar region, with neurogenic claudication  THERAPY DIAG:  Unsteadiness on feet  Other abnormalities of gait and mobility  Muscle weakness (generalized)  Rationale for  Evaluation and Treatment: Rehabilitation  SUBJECTIVE:                                                                                                                                                                                             SUBJECTIVE STATEMENT: The patient reports good response to last aquatic session. Pain in high today "doctors don't even know what's going on" Pt accompanied by: self  PERTINENT HISTORY: Anemia, depression, osteopenia, seizures, thoracic meningioma with resection and radiation, L5-S1 anterior lumbar interbody fusion 2020  PAIN:  Are you having pain? Yes: NPRS scale: 8/10 Pain location: LB  and R hip  Pain description: sharp, dull, aching, pulsing Aggravating factors: sitting Relieving factors: laying down on ice *this is with the medicine  PRECAUTIONS: Fall  WEIGHT BEARING RESTRICTIONS: No  FALLS: Has patient fallen in last 6 months? No  PATIENT GOALS: "want to get some strength back in my legs"  OBJECTIVE:  (From Initial evaluation) DIAGNOSTIC FINDINGS: 11/08/20 lumbar CT: Progressed dextroconvex thoracolumbar scoliosis, grade 1 spondylolisthesis at L5-S1 with solid-appearing fusion, Chronic advanced rightward degeneration at both L4-L5 and L5-S1  LOWER EXTREMITY ROM:     Active  Right Eval Left Eval  Knee flexion    Knee extension    Ankle dorsiflexion -5 10  Ankle plantarflexion     (Blank rows = not tested)  LOWER EXTREMITY MMT:    MMT (in sitting) Right Eval Left Eval  Hip flexion 3+ 4+  Hip extension    Hip abduction 4+ 4+  Hip adduction 4 4+  Knee flexion 4- 4+  Knee extension 2 4+  Ankle dorsiflexion 2 4+  Ankle plantarflexion 4 4+  (Blank rows = not tested)  GAIT: Gait pattern: R steppage gait with high guarding  Assistive device utilized:  walking pole Level of assistance: SBA  [facility] Adult PT Treatment:                                                DATE: 09/19/2022 Therapeutic Exercise: Pt seen for aquatic therapy today.  Treatment took place in water 3.25-4.5 ft in depth at the [place]. Temp of water was 91.  Pt entered/exited the pool via stairs step to pattern with hand rail.  Walking ue supported on white barbell With TrA seated on bench feet supported on water step:1/2 noodle pull dowon to knees 2x5   STS from bench then 3rd step. VC and demonstration for weight shift and execution Yellow hand buoys [employer]  submerged at sides side bending x 10. *with rle submerged with hand buoy walking forward and back x 4 widths     [facility] Adult PT Treatment:                                                DATE:  09/10/2022 Therapeutic Exercise: Quadriped   Hip extension x 3 reps R and L with core engagement cues Tall kneeling with cue for core and glut engagement Supine  Piriformis stretch Marching with core engagement x 5 reps Bridges x 3 reps HS stretch due to cramping P/ROM Seated Piriformis stretch R and L Long arc quad (PT facilitated full ROM at end of patient's ability and had patient lower slowly) Marching x 5 reps R and L Manual Therapy: Massage stick R gluteals Tennis ball STM against wall discussed for home for release of glut pain  Aquatic TREATMENT 08/30/22:                                                                                                                              Pt seen for aquatic therapy today.  Treatment took place in water 3.25-4.5 ft in depth at the [place]. Temp of water was 91.  Pt entered/exited the pool via stairs step to pattern with hand rail.  Walking ue supported on white barbell With TrA seated on bench feet supported on water step:1/2 noodle pull dowon to knees 2x5 (difficulty maintaining right knee on water step); standing x 10 STS from bench to step with difficulty x 3 then onto pool bottom. Cues for hip and knee engagement  Pt requires the buoyancy and hydrostatic pressure of water for support, and to offload joints by unweighting joint load by at least 50 % in navel deep water and by at least 75-80% in chest to neck deep water.  Viscosity of the water is needed for resistance of strengthening. Water current perturbations provides challenge to standing balance requiring increased core activation.  PATIENT EDUCATION: Education details: HEP initiation Person educated: Patient Education method: Explanation, Demonstration, Tactile cues, and Verbal cues, handout Education comprehension: verbalized understanding, return demonstrated  HOME EXERCISE PROGRAM: Access Code: [email or web address] URL: [email or web address] Date:  09/10/2022 Prepared by: Rudell Cobb  Exercises - Seated Long Arc Quad  - 1 x daily - 2-3 x weekly - 1 sets - 5-10 reps - Supine March  - 1 x daily - 2-3 x weekly - 1 sets - 5-10 reps - Supine Piriformis Stretch with Foot on Ground  - 1 x daily - 2-3 x weekly - 1 sets - 2 reps - 20 seconds hold - Standing Piriformis Release with Ball at [place]  - 1 x daily - 2-3 x weekly - 1 sets - 1 reps  GOALS: Goals reviewed with patient? Yes  SHORT TERM GOALS:  Target date: 09/11/2022  Patient to be independent with initial HEP. Baseline: HEP initiated Goal status: MET  LONG TERM GOALS: Target date: 10/02/2022  Patient to be independent with advanced HEP. Baseline: Not yet initiated  Goal status: INITIAL  Patient to demonstrate safe stair navigation with 1 handrail as needed. Baseline: pt notes significant difficulty Goal status: INITIAL  Patient to complete TUG in <14 sec with LRAD in order to decrease risk of falls.   Baseline: 15.82 Goal status: INITIAL  Patient to demonstrate 5xSTS with reduced UE support in <15 sec in order to decrease risk of falls and improve functional strength.  Baseline: 12.71 sec with B armrests Goal status: INITIAL  Patient to score at least 46/56 on [place] in order to decrease risk of falls.  Baseline: 43 Goal status: INITIAL  ASSESSMENT: CLINICAL IMPRESSION: Pain 8/10 upon initiation of session.  She reports through session and good reduction to ~4/10. Focus on continued gentle movement, stretching. Core Strengthening activities focused on right sided firing with left sided stretching. She does fatigue and require 2 seated rest periods. She will continue to be seen [facility] at [facility] as she reports better toleration of exercises aquatic based for now.  Will consider returning land based when appropriate. Goals ongoing   OBJECTIVE IMPAIRMENTS: Abnormal gait, decreased balance, decreased ROM, decreased strength, impaired flexibility, postural dysfunction, and  pain.   PLAN:  PT FREQUENCY: 2x/week  PT DURATION: 6 weeks  PLANNED INTERVENTIONS: Therapeutic exercises, Therapeutic activity, Neuromuscular re-education, Balance training, Gait training, Patient/Family education, Self Care, Joint mobilization, Stair training, Vestibular training, [place] repositioning, Orthotic/Fit training, DME instructions, Aquatic Therapy, Dry Needling, Electrical stimulation, Cryotherapy, Moist heat, Taping, Manual therapy, and Re-evaluation  PLAN FOR NEXT SESSION: initiate HEP for R LE strengthening and balance; stairs, work on [place] and increased [place] activities towards [employer], PTMPT 09/19/22 6:20 PM

## 2022-09-20 ENCOUNTER — Ambulatory Visit: Payer: Medicare PPO | Admitting: Obstetrics and Gynecology

## 2022-09-20 NOTE — Telephone Encounter (Signed)
Spoke with patient today on the phone. We reviewed the CT today, patient has an appointment with the GI doctor Monday.

## 2022-09-20 NOTE — Telephone Encounter (Signed)
Left voicemail for patient to call the office back.   

## 2022-09-20 NOTE — Telephone Encounter (Signed)
Not sure if you wanted to try calling her again to discuss?

## 2022-09-20 NOTE — Telephone Encounter (Signed)
Pt called upset because she states she called several days ago and never got a call back. CMA was unavailable. Pt asked that CMA call back at her earliest convenience.

## 2022-09-20 NOTE — Progress Notes (Deleted)
[facility]   Subjective:     Chief Complaint: No chief complaint on file.  History of Present Illness: Kaitlyn Flowers is a 82 y.o. female with stage II pelvic organ prolapse who presents for a pessary check. She is using a size 2 1/4  incontinence ring pessary. The pessary has been working well and she has no complaints. She {ACTION; IS/IS GI:[id number] using vaginal estrogen. She denies vaginal bleeding.  Past Medical History: Patient  has a past medical history of Anemia, Arthritis, Back pain, Cancer ([place]), Depression, Leg weakness, Osteopenia, PONV (postoperative nausea and vomiting), Right leg pain, Seizures ([place]), Spinal cord tumor, Urinary tract infection, and Wears hearing aid.   Past Surgical History: She  has a past surgical history that includes Abdominal hysterectomy (1973); shoulder rotator culff  (Left, 2006); spinal cord tumor (1987); Appendectomy; Rotator cuff repair; Lumbar wound debridement (12/06/2011); Colonoscopy; Anterior lumbar fusion (N/A, 05/17/2019); and Abdominal exposure (N/A, 05/17/2019).   Medications: She has a current medication list which includes the following prescription(s): acetylcysteine, vitamin d3, cyanocobalamin, lamotrigine, magnesium, NONFORMULARY OR COMPOUNDED ITEM, pyridoxine, and tramadol.   Allergies: Patient is allergic to ibuprofen-famotidine, fentanyl, pregabalin, and penicillins.   Social History: Patient  reports that she quit smoking about 55 years ago. Her smoking use included cigarettes. She has a 0.50 pack-year smoking history. She has never used smokeless tobacco. She reports current alcohol use of about 4.0 standard drinks of alcohol per week. She reports that she does not use drugs.      Objective:    Physical Exam: LMP  (LMP Unknown)  Gen: No apparent distress, A&O x 3. Detailed Urogynecologic Evaluation:  Pelvic Exam: Normal external female genitalia; Bartholin's and Skene's glands normal in appearance;  urethral meatus {urethra:24773}, no urethral masses or discharge. The pessary was noted to be {in place:24774}. It was removed and cleaned. Speculum exam revealed {vaginal lesions:24775} in the vagina. The pessary was replaced. It was comfortable to the patient and fit well.       No data to display          Laboratory Results: Urine dipstick shows: {ua dip:315374::"negative for all components"}.    Assessment/Plan:    Assessment: Kaitlyn Flowers is a 82 y.o. with {PFD symptoms:24771} here for a pessary check. She is doing well.  Plan: She will {pessary plan:24776}. She will continue to use {lubricant:24777}. She will follow-up in *** {days/wks/mos/yrs:310907} for a pessary check or sooner as needed.  All questions were answered.   Time Spent:

## 2022-09-23 ENCOUNTER — Ambulatory Visit: Payer: Medicare PPO | Admitting: Obstetrics and Gynecology

## 2022-09-23 DIAGNOSIS — R109 Unspecified abdominal pain: Secondary | ICD-10-CM | POA: Diagnosis not present

## 2022-09-23 DIAGNOSIS — K5901 Slow transit constipation: Secondary | ICD-10-CM | POA: Diagnosis not present

## 2022-09-23 DIAGNOSIS — Z86010 Personal history of colonic polyps: Secondary | ICD-10-CM | POA: Diagnosis not present

## 2022-09-24 ENCOUNTER — Ambulatory Visit (HOSPITAL_BASED_OUTPATIENT_CLINIC_OR_DEPARTMENT_OTHER): Payer: Medicare PPO | Admitting: Physical Therapy

## 2022-09-24 ENCOUNTER — Encounter (HOSPITAL_BASED_OUTPATIENT_CLINIC_OR_DEPARTMENT_OTHER): Payer: Self-pay | Admitting: Physical Therapy

## 2022-09-24 ENCOUNTER — Ambulatory Visit: Payer: Medicare PPO | Admitting: Physical Therapy

## 2022-09-24 DIAGNOSIS — R2681 Unsteadiness on feet: Secondary | ICD-10-CM | POA: Diagnosis not present

## 2022-09-24 DIAGNOSIS — R2689 Other abnormalities of gait and mobility: Secondary | ICD-10-CM

## 2022-09-24 DIAGNOSIS — M6281 Muscle weakness (generalized): Secondary | ICD-10-CM | POA: Diagnosis not present

## 2022-09-24 NOTE — Therapy (Signed)
OUTPATIENT PHYSICAL THERAPY NEURO    Patient Name: Kaitlyn Flowers MRN: [id number] DOB:02/03/1941, 82 y.o., female Today's Date: 09/24/2022   PCP: Burnis Medin, MD  REFERRING PROVIDER: Stefanie Libel, MD  END OF SESSION:  PT End of Session - 09/24/22 1533     Visit Number 5    Number of Visits 13    Date for PT Re-Evaluation 10/02/22    Authorization Type [employer]    PT Start Time 1446    PT Stop Time 1528    PT Time Calculation (min) 42 min    Activity Tolerance Patient tolerated treatment well    Behavior During Therapy WFL for tasks assessed/performed               Past Medical History:  Diagnosis Date   Anemia    Arthritis    Back pain    Cancer ([place])    skin nose   Depression    situational,due to pain   Leg weakness    Osteopenia    PONV (postoperative nausea and vomiting)    Right leg pain    Seizures ([place])    05/13/2019- 4 years ago, "couldnt process" couldnt speak happened 2 times; none since   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   Urinary tract infection    Wears hearing aid    Past Surgical History:  Procedure Laterality Date   ABDOMINAL EXPOSURE N/A 05/17/2019   Procedure: ABDOMINAL EXPOSURE;  Surgeon: Angelia Mould, MD;  Location: [place];  Service: Vascular;  Laterality: N/A;   ABDOMINAL HYSTERECTOMY  1973   1/2 of ovary left cyst on ovary   ANTERIOR LUMBAR FUSION N/A 05/17/2019   Procedure: Lumbar five Sacral one  Anterior lumbar interbody fusion;  Surgeon: Kristeen Miss, MD;  Location: [place];  Service: Neurosurgery;  Laterality: N/A;   APPENDECTOMY     removed with hysterectomy   COLONOSCOPY     LUMBAR WOUND DEBRIDEMENT  12/06/2011   Procedure: LUMBAR WOUND DEBRIDEMENT;  Surgeon: Kristeen Miss, MD;  Location: [place] [facility];  Service: Neurosurgery;  Laterality: N/A;  Repair of Pseudomeningocele Lumbar wound   ROTATOR CUFF REPAIR     left    shoulder rotator culff  Left 2006    spinal cord tumor  1987   2012 october    Patient Active Problem List   Diagnosis Date Noted   Foot drop, right foot 02/08/2021   Midline cystocele 12/31/2019   Spondylolisthesis at L5-S1 level 05/17/2019   Tension-type headache, not intractable 06/11/2017   Right leg weakness 06/11/2017   Labile hypertension 09/27/2016   Autonomic dysfunction 09/27/2016   Localization-related idiopathic epilepsy and epileptic syndromes with seizures of localized onset, not intractable, without status epilepticus ([place]) 08/01/2016   Hyponatremia 04/01/2016   Nocturnal leg cramps 12/07/2015   Spinal stenosis, lumbar region, 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   Osteoporosis 12/26/2013   Glucosuria 12/16/2012   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/11/2011   Fatigue 11/11/2011   Medicare annual wellness visit, initial 12/18/2010   Wears hearing aid 12/18/2010   Back pain 11/23/2010   ABDOMINAL BRUIT 07/21/2009    ONSET DATE: several years   REFERRING DIAG: M21.371 (ICD-10-CM) - Foot drop, right foot M43.17 (ICD-10-CM) - Spondylolisthesis at L5-S1 level M48.062 (ICD-10-CM) - Spinal stenosis, lumbar region, with neurogenic claudication  THERAPY DIAG:  Unsteadiness on feet  Other abnormalities of gait and mobility  Muscle weakness (generalized)  Rationale  for Evaluation and Treatment: Rehabilitation  SUBJECTIVE:                                                                                                                                                                                             SUBJECTIVE STATEMENT: The patient reports good response to last aquatic session. Pain went down to a 4-5 for 24 hours" Pt accompanied by: self  PERTINENT HISTORY: Anemia, depression, osteopenia, seizures, thoracic meningioma with resection and radiation, L5-S1 anterior lumbar interbody fusion 2020  PAIN:  Are you having pain? Yes: NPRS scale: 8/10 Pain location: LB and R hip  Pain  description: sharp, dull, aching, pulsing Aggravating factors: sitting Relieving factors: laying down on ice *this is with the medicine  PRECAUTIONS: Fall  WEIGHT BEARING RESTRICTIONS: No  FALLS: Has patient fallen in last 6 months? No  PATIENT GOALS: "want to get some strength back in my legs"  OBJECTIVE:  (From Initial evaluation) DIAGNOSTIC FINDINGS: 11/08/20 lumbar CT: Progressed dextroconvex thoracolumbar scoliosis, grade 1 spondylolisthesis at L5-S1 with solid-appearing fusion, Chronic advanced rightward degeneration at both L4-L5 and L5-S1  LOWER EXTREMITY ROM:     Active  Right Eval Left Eval  Knee flexion    Knee extension    Ankle dorsiflexion -5 10  Ankle plantarflexion     (Blank rows = not tested)  LOWER EXTREMITY MMT:    MMT (in sitting) Right Eval Left Eval  Hip flexion 3+ 4+  Hip extension    Hip abduction 4+ 4+  Hip adduction 4 4+  Knee flexion 4- 4+  Knee extension 2 4+  Ankle dorsiflexion 2 4+  Ankle plantarflexion 4 4+  (Blank rows = not tested)  GAIT: Gait pattern: R steppage gait with high guarding  Assistive device utilized:  walking pole Level of assistance: SBA  [facility] Adult PT Treatment:                                                DATE: 09/24/2022 Therapeutic Exercise: Pt seen for aquatic therapy today.  Treatment took place in water 3.25-4.5 ft in depth at the [facility]. Temp of water was 91.  Pt entered/exited the pool via stairs step to pattern with hand rail.  Walking ue supported on white barbell Seated on lift:  add/abd; 3 sets of 20 reps cues for increasing speed cycling between sets Standing R QL stretching Yellow hand buoys rue submerged at sides side bending x 10. With TrA seated on bench  feet on floor:1/2 noodle pull dowon to knees 2x5  -standing wide stance then feet staggered 1/2 noodle pull down x 3reps in each position  STS from bench onto pool bottom 2x5. VC and demonstration for weight shift and  execution Balance/core firing solid noodle under arms cue for gaining stationary balance in vertical position. *with rle submerged with hand buoy walking forward and back x 4 widths    PATIENT EDUCATION: Education details: HEP initiation Person educated: Patient Education method: Explanation, Demonstration, Tactile cues, and Verbal cues, handout Education comprehension: verbalized understanding, return demonstrated  HOME EXERCISE PROGRAM: Access Code: [email or web address] URL: [email or web address] Date: 09/10/2022 Prepared by: Rudell Cobb  Exercises - Seated Long Arc Quad  - 1 x daily - 2-3 x weekly - 1 sets - 5-10 reps - Supine March  - 1 x daily - 2-3 x weekly - 1 sets - 5-10 reps - Supine Piriformis Stretch with Foot on Ground  - 1 x daily - 2-3 x weekly - 1 sets - 2 reps - 20 seconds hold - Standing Piriformis Release with Ball at [place]  - 1 x daily - 2-3 x weekly - 1 sets - 1 reps  GOALS: Goals reviewed with patient? Yes  SHORT TERM GOALS: Target date: 09/11/2022  Patient to be independent with initial HEP. Baseline: HEP initiated Goal status: MET  LONG TERM GOALS: Target date: 10/02/2022  Patient to be independent with advanced HEP. Baseline: Not yet initiated  Goal status: INITIAL  Patient to demonstrate safe stair navigation with 1 handrail as needed. Baseline: pt notes significant difficulty Goal status: INITIAL  Patient to complete TUG in <14 sec with LRAD in order to decrease risk of falls.   Baseline: 15.82 Goal status: INITIAL  Patient to demonstrate 5xSTS with reduced UE support in <15 sec in order to decrease risk of falls and improve functional strength.  Baseline: 12.71 sec with B armrests Goal status: INITIAL  Patient to score at least 46/56 on [place] in order to decrease risk of falls.  Baseline: 43 Goal status: INITIAL  ASSESSMENT: CLINICAL IMPRESSION: Continued good response to aquatic sessions.  She tolerates progression of exercises  well and is comfortable vocalizing tiredness/fatigue and discomfort.  She does not tolerate sitting for any length of time as she is unable to get her back comfortable.  Pt challenged well with balance tasks using noodle. Does require adjusting position and moving noodle from posterior to anterior to gain proper motor plan to balance.  Core weakness evident with task.  Goals ongoing      OBJECTIVE IMPAIRMENTS: Abnormal gait, decreased balance, decreased ROM, decreased strength, impaired flexibility, postural dysfunction, and pain.   PLAN:  PT FREQUENCY: 2x/week  PT DURATION: 6 weeks  PLANNED INTERVENTIONS: Therapeutic exercises, Therapeutic activity, Neuromuscular re-education, Balance training, Gait training, Patient/Family education, Self Care, Joint mobilization, Stair training, Vestibular training, Canalith repositioning, Orthotic/Fit training, DME instructions, Aquatic Therapy, Dry Needling, Electrical stimulation, Cryotherapy, Moist heat, Taping, Manual therapy, and Re-evaluation  PLAN FOR NEXT SESSION: initiate HEP for R LE strengthening and balance; stairs, work on [place] and increased [place] activities towards [place], PTMPT 09/24/22 3:33 PM

## 2022-09-27 ENCOUNTER — Encounter: Payer: Self-pay | Admitting: Neurology

## 2022-09-27 ENCOUNTER — Encounter (HOSPITAL_BASED_OUTPATIENT_CLINIC_OR_DEPARTMENT_OTHER): Payer: Self-pay | Admitting: Physical Therapy

## 2022-09-27 ENCOUNTER — Ambulatory Visit: Payer: Medicare PPO | Admitting: Neurology

## 2022-09-27 ENCOUNTER — Ambulatory Visit (HOSPITAL_BASED_OUTPATIENT_CLINIC_OR_DEPARTMENT_OTHER): Payer: Medicare PPO | Admitting: Physical Therapy

## 2022-09-27 VITALS — BP 118/62 | HR 84 | Ht 60.0 in | Wt 103.8 lb

## 2022-09-27 DIAGNOSIS — M6281 Muscle weakness (generalized): Secondary | ICD-10-CM | POA: Diagnosis not present

## 2022-09-27 DIAGNOSIS — M25552 Pain in left hip: Secondary | ICD-10-CM | POA: Diagnosis not present

## 2022-09-27 DIAGNOSIS — R2689 Other abnormalities of gait and mobility: Secondary | ICD-10-CM

## 2022-09-27 DIAGNOSIS — R2681 Unsteadiness on feet: Secondary | ICD-10-CM | POA: Diagnosis not present

## 2022-09-27 DIAGNOSIS — G629 Polyneuropathy, unspecified: Secondary | ICD-10-CM | POA: Diagnosis not present

## 2022-09-27 DIAGNOSIS — R202 Paresthesia of skin: Secondary | ICD-10-CM | POA: Diagnosis not present

## 2022-09-27 DIAGNOSIS — G40009 Localization-related (focal) (partial) idiopathic epilepsy and epileptic syndromes with seizures of localized onset, not intractable, without status epilepticus: Secondary | ICD-10-CM

## 2022-09-27 DIAGNOSIS — R52 Pain, unspecified: Secondary | ICD-10-CM

## 2022-09-27 MED ORDER — GABAPENTIN 100 MG PO CAPS: ORAL_CAPSULE | ORAL | 6 refills | Status: DC

## 2022-09-27 MED ORDER — LAMOTRIGINE 25 MG PO TABS: ORAL_TABLET | ORAL | 3 refills | Status: DC

## 2022-09-27 NOTE — Patient Instructions (Signed)
Good to see you.  Schedule EMG/NCV of left leg  2. Schedule left hip xray  3. Start gabapentin '100mg'$ : take 1 capsule every night. If it makes you too drowsy/goofy, try taking it every other night  4. Continue Lamotrigine '25mg'$  every night  5. Continue aquatherapy  6. Follow-up in 3-4 months, call for any changes   Seizure Precautions: 1. If medication has been prescribed for you to prevent seizures, take it exactly as directed.  Do not stop taking the medicine without talking to your doctor first, even if you have not had a seizure in a long time.   2. Avoid activities in which a seizure would cause danger to yourself or to others.  Don't operate dangerous machinery, swim alone, or climb in high or dangerous places, such as on ladders, roofs, or girders.  Do not drive unless your doctor says you may.  3. If you have any warning that you may have a seizure, lay down in a safe place where you can't hurt yourself.    4.  No driving for 6 months from last seizure, as per [facility].   Please refer to the following link on the [place] website for more information: [email or web address]   5.  Maintain good sleep hygiene. Avoid alcohol.  6.  Contact your doctor if you have any problems that may be related to the medicine you are taking.  7.  Call 911 and bring the patient back to the ED if:        A.  The seizure lasts longer than 5 minutes.       B.  The patient doesn't awaken shortly after the seizure  C.  The patient has new problems such as difficulty seeing, speaking or moving  D.  The patient was injured during the seizure  E.  The patient has a temperature over 102 F (39C)  F.  The patient vomited and now is having trouble breathing

## 2022-09-27 NOTE — Progress Notes (Signed)
NEUROLOGY FOLLOW UP OFFICE NOTE  Kaitlyn Flowers [id number] [birth year]  HISTORY OF PRESENT ILLNESS: I had the pleasure of seeing Kaitlyn Flowers in follow-up in the neurology clinic on 09/27/2022.  The patient was last seen a year ago for seizures. She had a syncopal episode in 03/2016, 2 episodes of transient aphasia in 03/2016 and 07/2016. Stroke workup negative, her EEG in 2017 was abnormal with bialteral temporal slowing and left frontotemporal sharp waves. She has not tolerated higher doses of Lamotrigine and has been taking low dose Lamotrigine '25mg'$  qhs since 2017 with no further episodes of aphasia or loss of consciousness. She has had chronic back pain but reports a different type of sensation localized to the left hip region. She states that her back pain and nerve pain have been over the top out of control. It is not constant but occurs frequently with pain down her legs. Recently, she feels like a swarm of bees start stinging her on the left hip area 2-3 times a day for a few hours. She has Tramadol for her chronic pain but it does not help as much anymore. She tried Cymbalta, Baclofen, Gabapentin, and Lyrica but they made her feel "goofy." She was taking them in the morning (has not tried taking them at bedtime). She has some constipation, no incontinence. She has tried lidocaine rubs which help for a short period of time.    History On Initial Assessment 05/29/2016: This is a very pleasant 82 yo RH woman who presented after a syncopal episode on 03/03/16 then a transient episode of aphasia on 03/25/16. She lives alone and was sitting outside last 03/03/16. She recalls turning around to get water, then blacked out. She woke up on the ground and found her glasses broken, she hit the right side of her face. No tongue bite/incontinence. She denied any prodromal symptoms, no dizziness or headaches. She had been drinking a lot of water due to recurrent UTIs since January 2017. She went to the ER, it  was felt fall was likely a result of orthostatic hypotension from sitting out in the heat. No bloodwork on record from that visit. Then on 03/25/16 she was reading something off her phone and could not make sense of it. She showed the phone to her friend who said it looked fine. Then she found she could not "get/grab my words." Her friend drove her to the ER, she felt her friend was driving the wrong way and she was thinking this, but could not say it. This lasted 20-30 minutes, no associated focal numbness/tingling/weakness, headaches, or dizziness. Her BP on arrival was 139/72. I personally reviewed head CT without contrast which did not show any acute changes. She was found to have a sodium level of 123. She was also diagnosed with another UTI with >100,000 aerococcus urinae. She denies any further recurrence of symptoms. She denies any olfactory/gustatory hallucinations, deja vu, rising epigastric sensation, focal numbness/tingling/weakness, myoclonic jerks. She has chronic back pain and self-catheterizes herself every night. She has chronic right leg numbness and weakness. For the past 1-1/2 years, she has had pain the right sacral area, worsened by sitting. She continues to work with PT.    She underwent a stroke workup, I personally reviewed images available. 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 MRA head did not show any intracranial stenosis. Carotid dopplers did not show any evidence of ICA stenosis bilaterally. Her echocardiogram showed normal EF  60-65%, normal RV size and systolic function, no significant valvular abnormalities, left atrium normal size. Lipid panel showed an LDL of 114, total cholesterol 241. She started to take a daily baby aspirin.  She saw her PCP on 11/6 reporting random headaches and wondered if related to her BP. She was scheduled for an earlier appointment today, then yesterday had another  transient episode of aphasia. 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 When she tried to speak, her words were jumbled. She states the aphasia lasted 30-40 minutes. A friend took her home then to her PCP office at noon, where bloodwork was done. She went to the ER at 11pm, reporting severe headache that started an hour prior. She had an intense pressure and throbbing over the left temporal region, 8/10, with nausea and vomiting. Headache lasted until this morning. No photo/phono/phonophobia. There was no aphasia noted in the ER, headache was treated with IV fluids and APAP. She denied any focal weakness or paresthesias. She denied having a headache with the previous episode of aphasia. She has no prior history of migraines. Over the past 2 weeks, she has been having random headaches localized on different parts of her head, occurring every 3-4 days, resolving after Tylenol intake.   Her mother had a stroke. Otherwise she had a normal birth and early development.  There is no history of febrile convulsions, CNS infections such as meningitis/encephalitis, significant traumatic brain injury, neurosurgical procedures, or family history of seizures.  Diagnostic Data: EEG in 07/2016: This awake and asleep EEG is abnormal due to the presence of: 1. Independent focal slowing seen over the bilateral temporal regions, left greater than right 2. Rare epileptiform discharges over the left frontotemporal region   EEG in 09/2018:  This 1-hour awake and asleep EEG is abnormal due to the presence of: 1. Focal slowing over the bilateral temporal regions, left greater than right 2. 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   Urinary tract infection    Wears hearing aid     MEDICATIONS: Current Outpatient Medications on File Prior to Visit  Medication Sig Dispense Refill   Acetylcysteine (N-ACETYL-L-CYSTEINE PO) Take 500 mg by mouth daily.     Cholecalciferol (VITAMIN D3) 125 MCG (5000 UT) TABS Take 1 tablet by mouth daily.     cyanocobalamin 1000 MCG tablet Take 1,000 mcg by mouth daily.     lamoTRIgine (LAMICTAL) 25 MG tablet TAKE 1 TABLET(25 MG) BY MOUTH EVERY EVENING 90 tablet 3   MAGNESIUM PO Take 1 tablet by mouth daily.     NONFORMULARY OR COMPOUNDED ITEM Vitamin E vaginal cream 200u/ml.  One ml pv twice weekly.  Disp. 3 month supply. 24 each 3   pyridoxine (B-6) 200 MG tablet Take 200 mg by mouth daily.     traMADol Veatrice Bourbon)  50 MG tablet TAKE 1 TABLET(50 MG) BY MOUTH TWICE DAILY AS NEEDED 60 tablet 2   No current facility-administered medications on file prior to visit.    ALLERGIES: Allergies  Allergen Reactions   Ibuprofen-Famotidine Other (See Comments)    Elevated BP   Fentanyl Other (See Comments)    Constipates for about 2 weeks   Pregabalin Swelling    AMS, Swelling   Penicillins Rash    Did it involve swelling of the face/tongue/throat, SOB, or low BP? No Did it involve sudden or severe rash/hives, skin peeling, or any reaction on the inside of your mouth or nose? No Did you need to seek medical attention at a hospital or doctor's office? No When did it last happen?      30+ years If all above answers are "NO", may proceed with cephalosporin use.     FAMILY HISTORY: Family History  Problem Relation Age of Onset   Stroke Mother        2010   Heart attack Father    Cancer Paternal Grandmother     SOCIAL  HISTORY: Social History   Socioeconomic History   Marital status: Widowed    Spouse name: Not on file   Number of children: Not on file   Years of education: Not on file   Highest education level: Not on file  Occupational History   Not on file  Tobacco Use   Smoking status: Former    Packs/day: 0.25    Years: 2.00    Total pack years: 0.50    Types: Cigarettes    Quit date: 09/03/1967    Years since quitting: 55.1   Smokeless tobacco: Never   Tobacco comments:    quit 48 years   Vaping Use   Vaping Use: Never used  Substance and Sexual Activity   Alcohol use: Yes    Alcohol/week: 4.0 standard drinks of alcohol    Types: 4 Glasses of wine per week    Comment: 4 a week   Drug use: No   Sexual activity: Not Currently    Partners: Male    Birth control/protection: Surgical    Comment: hysterectomy  Other Topics Concern   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   Born in [place] in [place] since [place]   Right handed   One story home   Lives alone   Social Determinants of Health   Financial Resource Strain: Low Risk  (04/29/2022)   Overall Financial Resource Strain (CARDIA)    Difficulty of Paying Living Expenses: Not hard at all  Food Insecurity: No Food Insecurity (04/29/2022)   Hunger Vital Sign    Worried About Running Out of Food in the Last Year: Never true    [place] in the Last Year: Never true  Transportation Needs: No Transportation Needs (04/29/2022)   PRAPARE - Hydrologist (Medical): No    Lack of Transportation (Non-Medical): No  Physical Activity: Sufficiently Active (04/29/2022)   Exercise Vital Sign    Days of Exercise per Week: 7 days    Minutes of Exercise per Session: 30 min  Stress: No Stress Concern Present (04/29/2022)   [place]    Feeling of Stress : Not at all  Social  Connections: Moderately Integrated (08/14/2020)   Social Connection and Isolation Panel [NHANES]    Frequency of Communication  with Friends and Family: More than three times a week    Frequency of Social Gatherings with Friends and Family: More than three times a week    Attends Religious Services: More than 4 times per year    Active Member of [employer] or Organizations: Yes    Attends Archivist Meetings: More than 4 times per year    Marital Status: Widowed  Intimate Partner Violence: Not At Risk (08/14/2020)   Humiliation, Afraid, Rape, and Kick questionnaire    Fear of Current or Ex-Partner: No    Emotionally Abused: No    Physically Abused: No    Sexually Abused: No     PHYSICAL EXAM: Vitals:   09/27/22 1334  BP: 118/62  Pulse: 84  SpO2: 98%   General: No acute distress Head:  Normocephalic/atraumatic Skin/Extremities: No rash, no edema Neurological Exam: alert and awake. No aphasia or dysarthria. Fund of knowledge is appropriate.  Attention and concentration are normal.   Cranial nerves: Pupils equal, round. Extraocular movements intact with no nystagmus. Visual fields full.  No facial asymmetry.  Motor: Bulk and tone normal, muscle strength 5/5 throughout except for 3/5 right foot dorsiflexion. Sensation intact to all modalities on both UE. She has hyperesthesia to pin in a circumscribe region from the side of her right buttock to mid-thigh. Intact temperature sensation. Reflexes +2 right UE, +1 left UE, +2 left LE, unable to elicit on right LE. Finger to nose testing intact.  Gait slow and cautious with steppage gait from right foot drop. No tremor today.    IMPRESSION: This is a pleasant 82 yo RH woman who had a syncopal episode on 03/03/16, then a transient episode of expressive aphasia last 03/25/16. Stroke workup was unremarkable, MRI brain did not show any acute changes, no significant stenosis seen intracranially or with carotid dopplers. Echo normal. She had another  transient episode of aphasia on 07/21/16, this time followed by intense headache. No headache with previous event. Her EEG was abnormal with bilateral temporal slowing and rare left frontotemporal epileptiform discharges. 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 Repeat EEG in 09/2018 showed similar findings. No further episodes since 2017 on very low dose Lamotrigine '25mg'$  qhs, refills sent. Main concern today is a burning sensation on the left hip region, there is some spread to the distribution of the lateral femoral cutaneous nerve (meralgia paresthetica), but due to localized symptoms, would rule out hip pathology. Hip xray will be ordered. She will be scheduled for an EMG/NCV of the left lower extremity to further evaluate symptoms. She is willing to re-try low dose gabapentin '100mg'$  qhs, side effects discussed. She may try taking it every other night if too much drowsiness occurs. Continue aquatherapy.She is aware of [place] driving laws to stop driving after a seizure until 6 months seizure-free. Follow-up in 3-4 months, call for any changes.   Thank you for allowing me to participate in her care.  Please do not hesitate to call for any questions or concerns.   Ellouise Newer, M.D.   CC: Dr. Regis Bill

## 2022-09-27 NOTE — Therapy (Signed)
OUTPATIENT PHYSICAL THERAPY NEURO    Patient Name: Kaitlyn Flowers MRN: [id number] DOB:June 22, 1941, 82 y.o., female Today's Date: 09/27/2022   PCP: Burnis Medin, MD  REFERRING PROVIDER: Stefanie Libel, MD  END OF SESSION:  PT End of Session - 09/27/22 1416     Visit Number 6    Number of Visits 13    Date for PT Re-Evaluation 10/02/22    Authorization Type [employer]    PT Start Time 1116    PT Stop Time 1200    PT Time Calculation (min) 44 min    Activity Tolerance Patient tolerated treatment well    Behavior During Therapy WFL for tasks assessed/performed                Past Medical History:  Diagnosis Date   Anemia    Arthritis    Back pain    Cancer ([place])    skin nose   Depression    situational,due to pain   Leg weakness    Osteopenia    PONV (postoperative nausea and vomiting)    Right leg pain    Seizures ([place])    05/13/2019- 4 years ago, "couldnt process" couldnt speak happened 2 times; none since   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   Urinary tract infection    Wears hearing aid    Past Surgical History:  Procedure Laterality Date   ABDOMINAL EXPOSURE N/A 05/17/2019   Procedure: ABDOMINAL EXPOSURE;  Surgeon: Angelia Mould, MD;  Location: [facility];  Service: Vascular;  Laterality: N/A;   ABDOMINAL HYSTERECTOMY  1973   1/2 of ovary left cyst on ovary   ANTERIOR LUMBAR FUSION N/A 05/17/2019   Procedure: Lumbar five Sacral one  Anterior lumbar interbody fusion;  Surgeon: Kristeen Miss, MD;  Location: [facility];  Service: Neurosurgery;  Laterality: N/A;   APPENDECTOMY     removed with hysterectomy   COLONOSCOPY     LUMBAR WOUND DEBRIDEMENT  12/06/2011   Procedure: LUMBAR WOUND DEBRIDEMENT;  Surgeon: Kristeen Miss, MD;  Location: [facility];  Service: Neurosurgery;  Laterality: N/A;  Repair of Pseudomeningocele Lumbar wound   ROTATOR CUFF REPAIR     left    shoulder rotator culff  Left 2006    spinal cord tumor  1987   2012 october    Patient Active Problem List   Diagnosis Date Noted   Foot drop, right foot 02/08/2021   Midline cystocele 12/31/2019   Spondylolisthesis at L5-S1 level 05/17/2019   Tension-type headache, not intractable 06/11/2017   Right leg weakness 06/11/2017   Labile hypertension 09/27/2016   Autonomic dysfunction 09/27/2016   Localization-related idiopathic epilepsy and epileptic syndromes with seizures of localized onset, not intractable, without status epilepticus ([place]) 08/01/2016   Hyponatremia 04/01/2016   Nocturnal leg cramps 12/07/2015   Spinal stenosis, lumbar region, 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   Osteoporosis 12/26/2013   Glucosuria 12/16/2012   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/11/2011   Fatigue 11/11/2011   Medicare annual wellness visit, initial 12/18/2010   Wears hearing aid 12/18/2010   Back pain 11/23/2010   ABDOMINAL BRUIT 07/21/2009    ONSET DATE: several years   REFERRING DIAG: M21.371 (ICD-10-CM) - Foot drop, right foot M43.17 (ICD-10-CM) - Spondylolisthesis at L5-S1 level M48.062 (ICD-10-CM) - Spinal stenosis, lumbar region, with neurogenic claudication  THERAPY DIAG:  Unsteadiness on feet  Other abnormalities of gait and mobility  Muscle weakness (generalized)  Rationale for Evaluation and Treatment: Rehabilitation  SUBJECTIVE:                                                                                                                                                                                             SUBJECTIVE STATEMENT: The patient reports increase in pain today, felt cycling on noodle increased right hip pain last session" Pt accompanied by: self  PERTINENT HISTORY: Anemia, depression, osteopenia, seizures, thoracic meningioma with resection and radiation, L5-S1 anterior lumbar interbody fusion 2020  PAIN:  Are you having pain? Yes: NPRS scale: 8/10 Pain location: LB and R hip   Pain description: sharp, dull, aching, pulsing Aggravating factors: sitting Relieving factors: laying down on ice *this is with the medicine  PRECAUTIONS: Fall  WEIGHT BEARING RESTRICTIONS: No  FALLS: Has patient fallen in last 6 months? No  PATIENT GOALS: "want to get some strength back in my legs"  OBJECTIVE:  (From Initial evaluation) DIAGNOSTIC FINDINGS: 11/08/20 lumbar CT: Progressed dextroconvex thoracolumbar scoliosis, grade 1 spondylolisthesis at L5-S1 with solid-appearing fusion, Chronic advanced rightward degeneration at both L4-L5 and L5-S1  LOWER EXTREMITY ROM:     Active  Right Eval Left Eval  Knee flexion    Knee extension    Ankle dorsiflexion -5 10  Ankle plantarflexion     (Blank rows = not tested)  LOWER EXTREMITY MMT:    MMT (in sitting) Right Eval Left Eval  Hip flexion 3+ 4+  Hip extension    Hip abduction 4+ 4+  Hip adduction 4 4+  Knee flexion 4- 4+  Knee extension 2 4+  Ankle dorsiflexion 2 4+  Ankle plantarflexion 4 4+  (Blank rows = not tested)  GAIT: Gait pattern: R steppage gait with high guarding  Assistive device utilized:  walking pole Level of assistance: SBA  [facility] Adult PT Treatment:                                                DATE: 09/24/2022 Therapeutic Exercise: Pt seen for aquatic therapy today.  Treatment took place in water 3.25-4.5 ft in depth at the [facility]. Temp of water was 91.  Pt entered/exited the pool via stairs step to pattern with hand rail.  Walking ue supported on white barbell  Standing R QL stretching Yellow hand buoys rue submerged at sides side bending 2x 10, LUE supported on floating KB. *rle submerged with yellow hand buoy walking forward and back x 4 widths *suspended with yellow  noodle under arms: decompression for a few minutes then cycling; add/abd and skiing *KB row for core firing: LE staggered then wide stance x15 reps ea position *STS from 3rd step (90%) submerged. Worked  on technique to decrase heavy use of UE and trnk to rise. Focused on LE str *mini squats on bottom step ue support on hand rails  STS from bench onto pool bottom 2x5. VC and demonstration for weight shift and execution Balance/core firing solid noodle under arms cue for gaining stationary balance in vertical position. *with     PATIENT EDUCATION: Education details: HEP initiation Person educated: Patient Education method: Explanation, Demonstration, Tactile cues, and Verbal cues, handout Education comprehension: verbalized understanding, return demonstrated  HOME EXERCISE PROGRAM: Access Code: [email or web address] URL: [email or web address] Date: 09/10/2022 Prepared by: Rudell Cobb  Exercises - Seated Long Arc Quad  - 1 x daily - 2-3 x weekly - 1 sets - 5-10 reps - Supine March  - 1 x daily - 2-3 x weekly - 1 sets - 5-10 reps - Supine Piriformis Stretch with Foot on Ground  - 1 x daily - 2-3 x weekly - 1 sets - 2 reps - 20 seconds hold - Standing Piriformis Release with Ball at [place]  - 1 x daily - 2-3 x weekly - 1 sets - 1 reps  GOALS: Goals reviewed with patient? Yes  SHORT TERM GOALS: Target date: 09/11/2022  Patient to be independent with initial HEP. Baseline: HEP initiated Goal status: MET  LONG TERM GOALS: Target date: 10/02/2022  Patient to be independent with advanced HEP. Baseline: Not yet initiated  Goal status: INITIAL  Patient to demonstrate safe stair navigation with 1 handrail as needed. Baseline: pt notes significant difficulty Goal status: INITIAL  Patient to complete TUG in <14 sec with LRAD in order to decrease risk of falls.   Baseline: 15.82 Goal status: INITIAL  Patient to demonstrate 5xSTS with reduced UE support in <15 sec in order to decrease risk of falls and improve functional strength.  Baseline: 12.71 sec with B armrests Goal status: INITIAL  Patient to score at least 46/56 on [place] in order to decrease risk of falls.  Baseline:  43 Goal status: INITIAL  ASSESSMENT: CLINICAL IMPRESSION: Some increase in pain after last session in left hip/distal rib area pt believes to be caused by straddled cycling.  We have eliminated from tasks. Replaced with under arm support of yellow noodle wrapped anteriorly around chest able to complete cycling, add/abd and skiing motion without increased hip pain also found position to relieve pain slightly. Progressed LE strengthening along with proper technique for STS which she has moderate difficulty with (due to le strength weaknesses).  Continued good response to aquatic sessions.  She tolerates progression of exercises well and is comfortable vocalizing tiredness/fatigue and discomfort.  She does not tolerate sitting for any length of time as she is unable to get her back comfortable.  Pt challenged well with balance tasks using noodle. Does require adjusting position and moving noodle from posterior to anterior to gain proper motor plan to balance.  Core weakness evident with task.  Goals ongoing      OBJECTIVE IMPAIRMENTS: Abnormal gait, decreased balance, decreased ROM, decreased strength, impaired flexibility, postural dysfunction, and pain.   PLAN:  PT FREQUENCY: 2x/week  PT DURATION: 6 weeks  PLANNED INTERVENTIONS: Therapeutic exercises, Therapeutic activity, Neuromuscular re-education, Balance training, Gait training, Patient/Family education, Self Care, Joint mobilization, Stair training, Vestibular training, Canalith repositioning, Orthotic/Fit training, DME instructions, Aquatic Therapy, Dry  Needling, Electrical stimulation, Cryotherapy, Moist heat, Taping, Manual therapy, and Re-evaluation  PLAN FOR NEXT SESSION: initiate HEP for R LE strengthening and balance; stairs, work on [place] and increased [place] activities towards [place], PTMPT 09/27/22 2:17 PM

## 2022-10-01 ENCOUNTER — Ambulatory Visit (INDEPENDENT_AMBULATORY_CARE_PROVIDER_SITE_OTHER): Payer: Medicare PPO | Admitting: Sports Medicine

## 2022-10-01 ENCOUNTER — Ambulatory Visit: Payer: Medicare PPO | Admitting: Physical Therapy

## 2022-10-01 ENCOUNTER — Ambulatory Visit (HOSPITAL_BASED_OUTPATIENT_CLINIC_OR_DEPARTMENT_OTHER): Payer: Medicare PPO | Admitting: Physical Therapy

## 2022-10-01 ENCOUNTER — Encounter (HOSPITAL_BASED_OUTPATIENT_CLINIC_OR_DEPARTMENT_OTHER): Payer: Self-pay | Admitting: Physical Therapy

## 2022-10-01 VITALS — BP 128/68 | Ht 60.0 in | Wt 102.0 lb

## 2022-10-01 DIAGNOSIS — M6281 Muscle weakness (generalized): Secondary | ICD-10-CM

## 2022-10-01 DIAGNOSIS — R2681 Unsteadiness on feet: Secondary | ICD-10-CM

## 2022-10-01 DIAGNOSIS — M21371 Foot drop, right foot: Secondary | ICD-10-CM | POA: Diagnosis not present

## 2022-10-01 DIAGNOSIS — R2689 Other abnormalities of gait and mobility: Secondary | ICD-10-CM

## 2022-10-01 NOTE — Assessment & Plan Note (Signed)
We tested an anterior panel AFO and this improves the ease of her walking We added a MT pad to the insert with reverse lift This was more comfortable We will try this conservative approach first but may need to get a custom AFO fitted in future

## 2022-10-01 NOTE — Progress Notes (Signed)
PCP: Burnis Medin, MD  Subjective:   HPI: Patient is a 82 y.o. female here for follow up of R foot drop. Marland Kitchen  At previous visits she received an orthotic forefoot lift. She was able to use this with significant improvement; she states her stride was much more equal on the R leg. She was only able to use this for three weeks and stopped due to discomfort at the plantar forefoot. Found shoes that has a more flexible toe box that is more comfortable when she had the toe lift. She otherwise has had no new symptoms of numbness or weakness. No new falls or new functional issues. She also brought in an AFO that she uses while driving, however finds that it is heavy.   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   Urinary tract infection    Wears hearing aid     Current Outpatient Medications on File Prior to Visit  Medication Sig Dispense Refill   Acetylcysteine (N-ACETYL-L-CYSTEINE PO) Take 500 mg by mouth daily.     Cholecalciferol (VITAMIN D3) 125 MCG (5000 UT) TABS Take 1 tablet by mouth daily.     cyanocobalamin 1000 MCG tablet Take 1,000 mcg by mouth daily.     gabapentin (NEURONTIN) 100 MG capsule Take 1 capsule every night 30 capsule 6   lamoTRIgine (LAMICTAL) 25 MG tablet TAKE 1 TABLET(25 MG) BY MOUTH EVERY EVENING 90 tablet 3   MAGNESIUM PO Take 1 tablet by mouth daily.     NONFORMULARY OR COMPOUNDED ITEM Vitamin E vaginal cream 200u/ml.  One ml pv twice weekly.  Disp. 3 month supply. 24 each 3   pyridoxine (B-6) 200 MG tablet Take 200 mg by mouth daily.     traMADol (ULTRAM) 50 MG tablet TAKE 1  TABLET(50 MG) BY MOUTH TWICE DAILY AS NEEDED 60 tablet 2   No current facility-administered medications on file prior to visit.    Past Surgical History:  Procedure Laterality Date   ABDOMINAL EXPOSURE N/A 05/17/2019   Procedure: ABDOMINAL EXPOSURE;  Surgeon: Angelia Mould, MD;  Location: [facility];  Service: Vascular;  Laterality: N/A;   ABDOMINAL HYSTERECTOMY  1973   1/2 of ovary left cyst on ovary   ANTERIOR LUMBAR FUSION N/A 05/17/2019   Procedure: Lumbar five Sacral one  Anterior lumbar interbody fusion;  Surgeon: Kristeen Miss, MD;  Location: [facility];  Service: Neurosurgery;  Laterality: N/A;   APPENDECTOMY     removed with hysterectomy   COLONOSCOPY     LUMBAR WOUND DEBRIDEMENT  12/06/2011   Procedure: LUMBAR WOUND DEBRIDEMENT;  Surgeon: Kristeen Miss, MD;  Location: [facility];  Service: Neurosurgery;  Laterality: N/A;  Repair of Pseudomeningocele Lumbar wound   ROTATOR CUFF REPAIR     left    shoulder rotator culff  Left 2006   spinal cord tumor  1987   2012 october     Allergies  Allergen Reactions   Ibuprofen-Famotidine Other (See Comments)  Elevated BP   Fentanyl Other (See Comments)    Constipates for about 2 weeks   Pregabalin Swelling    AMS, Swelling   Penicillins Rash    Did it involve swelling of the face/tongue/throat, SOB, or low BP? No Did it involve sudden or severe rash/hives, skin peeling, or any reaction on the inside of your mouth or nose? No Did you need to seek medical attention at a hospital or doctor's office? No When did it last happen?      30+ years If all above answers are "NO", may proceed with cephalosporin use.     BP 128/68   Ht 5' (1.524 m)   Wt 102 lb (46.3 kg)   LMP  (LMP Unknown)   BMI 19.92 kg/m       No data to display              No data to display              Objective:  Physical Exam:  Gen: NAD, comfortable in exam room  MSK  Right Foot: Hallux valgus deformity with mild erythema over medial  bony prominence, no swelling, no tenderness to palpation. Mild loss of transverse arch. Sensation to light touch intact across dermatomes L4-S1. Strength 5/5 plantarflexion, 3/5 dorsiflexion. Mild increase in callusing  Left foot Loss of long arch but not complete loss of transvers Hallux valgus   Gait: Antalgic, hip flexion with high knee lift on R during swing phase that persists with shoe insert Improved with shoe insert vs neutral insole   Assessment & Plan:  1. 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 At next follow-up, will consider creating another custom orthotic or anterior carbon fiber AFO. Advised continued LE exercises especially with hip flexion.    Estevan Oaks, MS4 [employer] [employer]  I observed and examined the patient with the medical student and agree with assessment and plan.  Note reviewed and modified by me. Ila Mcgill, MD

## 2022-10-01 NOTE — Therapy (Signed)
OUTPATIENT PHYSICAL THERAPY NEURO    Patient Name: Kaitlyn Flowers MRN: [id number] DOB:1940/09/26, 82 y.o., female Today's Date: 10/01/2022   PCP: Burnis Medin, MD  REFERRING PROVIDER: Stefanie Libel, MD  END OF SESSION:  PT End of Session - 10/01/22 1502     Visit Number 7    Number of Visits 13    Date for PT Re-Evaluation 10/02/22    Authorization Type [employer]    PT Start Time 1446    PT Stop Time 1530    PT Time Calculation (min) 44 min    Activity Tolerance Patient tolerated treatment well    Behavior During Therapy WFL for tasks assessed/performed                 Past Medical History:  Diagnosis Date   Anemia    Arthritis    Back pain    Cancer ([place])    skin nose   Depression    situational,due to pain   Leg weakness    Osteopenia    PONV (postoperative nausea and vomiting)    Right leg pain    Seizures ([place])    05/13/2019- 4 years ago, "couldnt process" couldnt speak happened 2 times; none since   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   Urinary tract infection    Wears hearing aid    Past Surgical History:  Procedure Laterality Date   ABDOMINAL EXPOSURE N/A 05/17/2019   Procedure: ABDOMINAL EXPOSURE;  Surgeon: Angelia Mould, MD;  Location: [facility];  Service: Vascular;  Laterality: N/A;   ABDOMINAL HYSTERECTOMY  1973   1/2 of ovary left cyst on ovary   ANTERIOR LUMBAR FUSION N/A 05/17/2019   Procedure: Lumbar five Sacral one  Anterior lumbar interbody fusion;  Surgeon: Kristeen Miss, MD;  Location: [facility];  Service: Neurosurgery;  Laterality: N/A;   APPENDECTOMY     removed with hysterectomy   COLONOSCOPY     LUMBAR WOUND DEBRIDEMENT  12/06/2011   Procedure: LUMBAR WOUND DEBRIDEMENT;  Surgeon: Kristeen Miss, MD;  Location: [facility];  Service: Neurosurgery;  Laterality: N/A;  Repair of Pseudomeningocele Lumbar wound   ROTATOR CUFF REPAIR     left    shoulder rotator culff  Left  2006   spinal cord tumor  1987   2012 october    Patient Active Problem List   Diagnosis Date Noted   Foot drop, right foot 02/08/2021   Midline cystocele 12/31/2019   Spondylolisthesis at L5-S1 level 05/17/2019   Tension-type headache, not intractable 06/11/2017   Right leg weakness 06/11/2017   Labile hypertension 09/27/2016   Autonomic dysfunction 09/27/2016   Localization-related idiopathic epilepsy and epileptic syndromes with seizures of localized onset, not intractable, without status epilepticus ([place]) 08/01/2016   Hyponatremia 04/01/2016   Nocturnal leg cramps 12/07/2015   Spinal stenosis, lumbar region, 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   Osteoporosis 12/26/2013   Glucosuria 12/16/2012   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/11/2011   Fatigue 11/11/2011   Medicare annual wellness visit, initial 12/18/2010   Wears hearing aid 12/18/2010   Back pain 11/23/2010   ABDOMINAL BRUIT 07/21/2009    ONSET DATE: several years   REFERRING DIAG: M21.371 (ICD-10-CM) - Foot drop, right foot M43.17 (ICD-10-CM) - Spondylolisthesis at L5-S1 level M48.062 (ICD-10-CM) - Spinal stenosis, lumbar region, with neurogenic claudication  THERAPY DIAG:  Unsteadiness on feet  Other abnormalities of gait and mobility  Muscle weakness (generalized)  Rationale for Evaluation and Treatment: Rehabilitation  SUBJECTIVE:                                                                                                                                                                                             SUBJECTIVE STATEMENT: The patient reports feeling like aquatic therapy is the best place for her to exercise. Pt accompanied by: self  PERTINENT HISTORY: Anemia, depression, osteopenia, seizures, thoracic meningioma with resection and radiation, L5-S1 anterior lumbar interbody fusion 2020  PAIN:  Are you having pain? Yes: NPRS scale: 8/10 Pain location: LB and R hip  Pain  description: sharp, dull, aching, pulsing Aggravating factors: sitting Relieving factors: laying down on ice *this is with the medicine  PRECAUTIONS: Fall  WEIGHT BEARING RESTRICTIONS: No  FALLS: Has patient fallen in last 6 months? No  PATIENT GOALS: "want to get some strength back in my legs"  OBJECTIVE:  (From Initial evaluation) DIAGNOSTIC FINDINGS: 11/08/20 lumbar CT: Progressed dextroconvex thoracolumbar scoliosis, grade 1 spondylolisthesis at L5-S1 with solid-appearing fusion, Chronic advanced rightward degeneration at both L4-L5 and L5-S1  LOWER EXTREMITY ROM:     Active  Right Eval Left Eval  Knee flexion    Knee extension    Ankle dorsiflexion -5 10  Ankle plantarflexion     (Blank rows = not tested)  LOWER EXTREMITY MMT:    MMT (in sitting) Right Eval Left Eval  Hip flexion 3+ 4+  Hip extension    Hip abduction 4+ 4+  Hip adduction 4 4+  Knee flexion 4- 4+  Knee extension 2 4+  Ankle dorsiflexion 2 4+  Ankle plantarflexion 4 4+  (Blank rows = not tested)  GAIT: Gait pattern: R steppage gait with high guarding  Assistive device utilized:  walking pole Level of assistance: SBA  [facility] Adult PT Treatment:                                                DATE: 10/01/2022 Therapeutic Exercise: Pt seen for aquatic therapy today.  Treatment took place in water 3.25-4.5 ft in depth at the [facility]. Temp of water was 91.  Pt entered/exited the pool via stairs step to pattern with hand rail.  Walking ue supported on white barbell Standing R QL stretching Yellow hand buoys rue submerged at sides side bending 2x 10, LUE supported on floating KB. *rle submerged with yellow hand buoy walking forward and back x 4 widths *suspended with yellow noodle under arms:  decompression for a few minutes then cycling; add/abd and skiing *KB row for core firing:  wide stance 3 x15 reps push/pull "2 o.clock" direction *STS from 3rd step (90%) submerged. Worked on  technique to decrase heavy use of UE and trnk to rise. Focused on LE str *mini squats on bottom step ue support on hand rails    PATIENT EDUCATION: Education details: HEP initiation Person educated: Patient Education method: Explanation, Demonstration, Tactile cues, and Verbal cues, handout Education comprehension: verbalized understanding, return demonstrated  HOME EXERCISE PROGRAM: Access Code: [email or web address] URL: [email or web address] Date: 09/10/2022 Prepared by: Rudell Cobb  Exercises - Seated Long Arc Quad  - 1 x daily - 2-3 x weekly - 1 sets - 5-10 reps - Supine March  - 1 x daily - 2-3 x weekly - 1 sets - 5-10 reps - Supine Piriformis Stretch with Foot on Ground  - 1 x daily - 2-3 x weekly - 1 sets - 2 reps - 20 seconds hold - Standing Piriformis Release with Ball at [place]  - 1 x daily - 2-3 x weekly - 1 sets - 1 reps  GOALS: Goals reviewed with patient? Yes  SHORT TERM GOALS: Target date: 09/11/2022  Patient to be independent with initial HEP. Baseline: HEP initiated Goal status: MET  LONG TERM GOALS: Target date: 10/02/2022  Patient to be independent with advanced HEP. Baseline: Not yet initiated  Goal status: INITIAL  Patient to demonstrate safe stair navigation with 1 handrail as needed. Baseline: pt notes significant difficulty Goal status: INITIAL  Patient to complete TUG in <14 sec with LRAD in order to decrease risk of falls.   Baseline: 15.82 Goal status: INITIAL  Patient to demonstrate 5xSTS with reduced UE support in <15 sec in order to decrease risk of falls and improve functional strength.  Baseline: 12.71 sec with B armrests Goal status: INITIAL  Patient to score at least 46/56 on Berg in order to decrease risk of falls.  Baseline: 43 Goal status: INITIAL  ASSESSMENT: CLINICAL IMPRESSION: No residual discomfort from last session.  Pt is progressed with strengthening and Rom with emphasis on right side strengthening with left side  stretching (QL) to open scoliotic curve.  She reports some tingling in left hip and low rib area as day progresses then relieved after night sleeping.  She continues to have a Environmental health practitioner 2 x week.  She is encouraged to plan on gaining acces to pool in order to be able to complete Aquatic HEP in near future.  Plan to recert next visit for few more sessions aquatic based.  She will then  continue with land based Pilates and aquatic HEP to manage condition.   OBJECTIVE IMPAIRMENTS: Abnormal gait, decreased balance, decreased ROM, decreased strength, impaired flexibility, postural dysfunction, and pain.   PLAN:  PT FREQUENCY: 2x/week  PT DURATION: 6 weeks  PLANNED INTERVENTIONS: Therapeutic exercises, Therapeutic activity, Neuromuscular re-education, Balance training, Gait training, Patient/Family education, Self Care, Joint mobilization, Stair training, Vestibular training, Canalith repositioning, Orthotic/Fit training, DME instructions, Aquatic Therapy, Dry Needling, Electrical stimulation, Cryotherapy, Moist heat, Taping, Manual therapy, and Re-evaluation  PLAN FOR NEXT SESSION:  re-cert. Aerobic capacity training .Aquatic HEP   Denton Meek, PTMPT 10/01/22 3:53 PM

## 2022-10-01 NOTE — Progress Notes (Unsigned)
[facility]   Subjective:     Chief Complaint: No chief complaint on file.  History of Present Illness: Kaitlyn Flowers is a 82 y.o. female with stage {Roman numeral i-vi:30201} pelvic organ prolapse who presents for a pessary check. She is using a size *** {pessary type:24772} pessary. The pessary has been working well and she has no complaints. She {ACTION; IS/IS CVK:[id number]} using vaginal estrogen. She denies vaginal bleeding.  Past Medical History: Patient  has a past medical history of Anemia, Arthritis, Back pain, Cancer ([place]), Depression, Leg weakness, Osteopenia, PONV (postoperative nausea and vomiting), Right leg pain, Seizures ([place]), Spinal cord tumor, Urinary tract infection, and Wears hearing aid.   Past Surgical History: She  has a past surgical history that includes Abdominal hysterectomy (1973); shoulder rotator culff  (Left, 2006); spinal cord tumor (1987); Appendectomy; Rotator cuff repair; Lumbar wound debridement (12/06/2011); Colonoscopy; Anterior lumbar fusion (N/A, 05/17/2019); and Abdominal exposure (N/A, 05/17/2019).   Medications: She has a current medication list which includes the following prescription(s): acetylcysteine, vitamin d3, cyanocobalamin, gabapentin, lamotrigine, magnesium, NONFORMULARY OR COMPOUNDED ITEM, pyridoxine, and tramadol.   Allergies: Patient is allergic to ibuprofen-famotidine, fentanyl, pregabalin, and penicillins.   Social History: Patient  reports that she quit smoking about 55 years ago. Her smoking use included cigarettes. She has a 0.50 pack-year smoking history. She has never used smokeless tobacco. She reports current alcohol use of about 4.0 standard drinks of alcohol per week. She reports that she does not use drugs.      Objective:    Physical Exam: LMP  (LMP Unknown)  Gen: No apparent distress, A&O x 3. Detailed Urogynecologic Evaluation:  Pelvic Exam: Normal external female genitalia; Bartholin's and Skene's  glands normal in appearance; urethral meatus {urethra:24773}, no urethral masses or discharge. The pessary was noted to be {in place:24774}. It was removed and cleaned. Speculum exam revealed {vaginal lesions:24775} in the vagina. The pessary was replaced. It was comfortable to the patient and fit well.       No data to display          Laboratory Results: Urine dipstick shows: {ua dip:315374::"negative for all components"}.    Assessment/Plan:    Assessment: Kaitlyn Flowers is a 82 y.o. with {PFD symptoms:24771} here for a pessary check. She is doing well.  Plan: She will {pessary plan:24776}. She will continue to use {lubricant:24777}. She will follow-up in *** {days/wks/mos/yrs:310907} for a pessary check or sooner as needed.  All questions were answered.   Time Spent:

## 2022-10-02 ENCOUNTER — Ambulatory Visit: Payer: Medicare PPO | Admitting: Neurology

## 2022-10-03 ENCOUNTER — Encounter: Payer: Self-pay | Admitting: Obstetrics and Gynecology

## 2022-10-03 ENCOUNTER — Ambulatory Visit: Payer: Medicare PPO | Admitting: Obstetrics and Gynecology

## 2022-10-03 VITALS — BP 154/81 | HR 76

## 2022-10-03 DIAGNOSIS — N8110 Cystocele, unspecified: Secondary | ICD-10-CM

## 2022-10-03 DIAGNOSIS — I10 Essential (primary) hypertension: Secondary | ICD-10-CM | POA: Diagnosis not present

## 2022-10-03 DIAGNOSIS — N393 Stress incontinence (female) (male): Secondary | ICD-10-CM | POA: Diagnosis not present

## 2022-10-03 NOTE — Patient Instructions (Signed)
Continue doing estrogen cream twice a week.

## 2022-10-04 ENCOUNTER — Ambulatory Visit (HOSPITAL_BASED_OUTPATIENT_CLINIC_OR_DEPARTMENT_OTHER): Payer: Medicare PPO | Admitting: Physical Therapy

## 2022-10-04 ENCOUNTER — Encounter (HOSPITAL_BASED_OUTPATIENT_CLINIC_OR_DEPARTMENT_OTHER): Payer: Self-pay | Admitting: Physical Therapy

## 2022-10-04 ENCOUNTER — Encounter (HOSPITAL_BASED_OUTPATIENT_CLINIC_OR_DEPARTMENT_OTHER): Payer: Self-pay

## 2022-10-04 DIAGNOSIS — R2681 Unsteadiness on feet: Secondary | ICD-10-CM

## 2022-10-04 DIAGNOSIS — R2689 Other abnormalities of gait and mobility: Secondary | ICD-10-CM | POA: Insufficient documentation

## 2022-10-04 DIAGNOSIS — M6281 Muscle weakness (generalized): Secondary | ICD-10-CM

## 2022-10-04 NOTE — Therapy (Signed)
Pt arrives and reports intestinal issues.  It is decided she should skip session.  Will be seen next visit for re-cert .

## 2022-10-04 NOTE — Telephone Encounter (Signed)
Prolia VOB initiated via [email or web address]  Last Prolia inj 04/29/22 Next Prolia inj due 11/01/22

## 2022-10-09 DIAGNOSIS — G9581 Conus medullaris syndrome: Secondary | ICD-10-CM | POA: Diagnosis not present

## 2022-10-15 ENCOUNTER — Ambulatory Visit: Payer: Medicare PPO | Admitting: Sports Medicine

## 2022-10-15 VITALS — BP 110/60 | Ht 60.0 in | Wt 102.0 lb

## 2022-10-15 DIAGNOSIS — M21371 Foot drop, right foot: Secondary | ICD-10-CM

## 2022-10-15 NOTE — Progress Notes (Signed)
  Kaitlyn Flowers - 82 y.o. female MRN [id number]  Date of birth: 05-Apr-1941  SUBJECTIVE:  Including CC & ROS.  CC: Follow up for right foot drop  HPI: Patient with history of L5-S1 fusion due to lumbar spondylolisthesis presenting for follow up of R foot drop.   Timeline of previous visits below:   07/02/22: First presentation to [facility] for foot drop. Was becoming problematic to the point of tripping multiple times. Patient given forefoot lift in right shoe insert and home exercises.   07/30/22: Started experiencing back pain after exercises, also experiencing toe irritation from lift. Referred to PT. Continued insole with forefoot lift.   10/01/22: Stopped using forefoot lift due to discomfort at toe, continued to have symptoms with foot drop, but eventually got new shoes with bigger toe box. These shoes were fitted with forefoot lift and MT pad. Tested anterior panel AFO, and found it to be more comfortable.  10/15/22: Continues to find benefit from forefoot lift, no longer having issues with toe irritation. However, only has these one pair of shoes with it. Brought in a metal AFO she uses for driving, which she finds heavy.    HISTORY: Past Medical, Surgical, Social, and Family History Reviewed & Updated per EMR.   Pertinent Historical Findings include: Lumbar spondylolisthesis and stenosis s/p L5-S1 fusion (2020) History of thoracic meningioma s/p surgical resection x2 (2013)  PHYSICAL EXAM:  VS: BP:110/60  HR: bpm  TEMP: ( )  RESP:   HT:5' (152.4 cm)   WT:102 lb (46.3 kg)  BMI:19.92 PHYSICAL EXAM: Gait: Hip flexion with knee lift on R during swing phase, greatly improved from prior.  Assessed gait with shoe insert and posterior AFO. Gait with this is only partially corrected.  ASSESSMENT & PLAN: See problem based charting & AVS for pt instructions.  Moderate-severe right foot drop - patient has benefited from custom shoe insert since last visit. At that time, she was tried on  anterior panel carbon fiber AFO. She would greatly benefit from a lightweight anterior panel AFO that she can alternate in other footwear to maintain activity. Her current metal AFO is too bulky for everyday/outside use and no longer is correcting her gait.  Estevan Oaks, MS4 [employer]  I observed and examined the patient with the Medical student and agree with assessment and plan.  Note reviewed and modified by me. I have done a full gait assessment in her current AFO and correction of gait is poor.  Her gait assessment in reverse shoe lift is improved but only partially.  When I placed her in a sample Ypison Brace by Genia Del her correction was excellent.  I believe we should try to get her fitted for this and then gradually increase her walking and activity. Ila Mcgill, MD

## 2022-10-15 NOTE — Assessment & Plan Note (Signed)
WE have made improvements for her that have led to less tripping and falling. These involve a lift from forefoot to midfoot in a sports insole on her RT foot. While this helps to increase her ability to walk and be active without high fall risk I think she would benefit from a lighter weight anterior panel AFO and will prescribe that.

## 2022-10-18 ENCOUNTER — Ambulatory Visit (INDEPENDENT_AMBULATORY_CARE_PROVIDER_SITE_OTHER): Payer: Medicare PPO | Admitting: Neurology

## 2022-10-18 ENCOUNTER — Telehealth: Payer: Self-pay

## 2022-10-18 DIAGNOSIS — M5417 Radiculopathy, lumbosacral region: Secondary | ICD-10-CM

## 2022-10-18 DIAGNOSIS — G629 Polyneuropathy, unspecified: Secondary | ICD-10-CM

## 2022-10-18 DIAGNOSIS — R52 Pain, unspecified: Secondary | ICD-10-CM

## 2022-10-18 DIAGNOSIS — M25552 Pain in left hip: Secondary | ICD-10-CM

## 2022-10-18 NOTE — Procedures (Signed)
  [facility]  [place], [place]  [place], [place] [address] Tel: [phone] Fax: [phone] Test Date:  10/18/2022  Patient: Kaitlyn Flowers DOB: Feb 17, 1941 Physician: Narda Amber, DO  Sex: Female Height: 5' 0"$  Ref Phys: Ellouise Newer, MD  ID#: [id number]   Technician:    History: This is a 82 year old female referred for evaluation of the left thigh paresthesias and pain.  NCV & EMG Findings: Extensive electrodiagnostic testing of the left lower extremity shows: Left sural and superficial peroneal sensory responses are within normal limits. Left peroneal motor response at the extensor digitorum brevis is reduced, and normal left tibialis anterior.  Left tibial motor response shows reduced amplitude. Left tibial H reflex study is within normal limits. Chronic motor axon loss changes are seen affecting the gastrocnemius, biceps femoris short head, rectus femoris, and adductor longus muscles, without accompanying active denervation.  Impression: Chronic radiculopathies affecting left L3, L4, and S1 nerve root/segments, mild. There is no evidence of a large fiber sensorimotor polyneuropathy affecting the left lower extremity.   ___________________________ Narda Amber, DO    Nerve Conduction Studies   Stim Site NR Peak (ms) Norm Peak (ms) O-P Amp (V) Norm O-P Amp  Left Sup Peroneal Anti Sensory (Ant Lat Mall)  32 C  12 cm    2.3 <4.6 15.4 >3  Left Sural Anti Sensory (Lat Mall)  32 C  Calf    3.3 <4.6 9.7 >3     Stim Site NR Onset (ms) Norm Onset (ms) O-P Amp (mV) Norm O-P Amp Site1 Site2 Delta-0 (ms) Dist (cm) Vel (m/s) Norm Vel (m/s)  Left Peroneal Motor (Ext Dig Brev)  32 C  Ankle    4.3 <6.0 *0.7 >2.5 B Fib Ankle 7.2 30.0 42 >40  B Fib    11.5  0.7  Poplt B Fib 1.6 9.0 56 >40  Poplt    13.1  0.7         Left Peroneal TA Motor (Tib Ant)  32 C  Fib Head    2.6 <4.5 4.0 >3 Poplit Fib Head 1.5 9.0 60 >40  Poplit    4.1 <5.7 4.0         Left  Tibial Motor (Abd Hall Brev)  32 C  Ankle    4.6 <6.0 *3.3 >4 Knee Ankle 5.9 38.0 64 >40  Knee    10.5  2.4          Electromyography   Side Muscle Ins.Act Fibs Fasc Recrt Amp Dur Poly Activation Comment  Left AntTibialis Nml Nml Nml Nml Nml Nml Nml Nml N/A  Left Gastroc Nml Nml Nml *1- *1+ *1+ *1+ Nml N/A  Left Flex Dig Long Nml Nml Nml Nml Nml Nml Nml Nml N/A  Left BicepsFemS Nml Nml Nml *1- *1+ *1+ *1+ Nml N/A  Left RectFemoris Nml Nml Nml *1- *1+ *1+ *1+ Nml N/A  Left AdductLong Nml Nml Nml *1- *1+ *1+ *1+ Nml N/A  Left GluteusMed Nml Nml Nml Nml Nml Nml Nml Nml N/A  Left Iliopsoas Nml Nml Nml Nml Nml Nml Nml Nml N/A      Waveforms:

## 2022-10-18 NOTE — Telephone Encounter (Signed)
-----   Message from Alda Berthold, DO sent at 10/18/2022  3:09 PM EST ----- Please let pt know that nerve testing shows there is nerve impingement stemming from her low back, which could cause some of her leg pain.  Recommend PT for low back strengthening.  Thanks.

## 2022-10-18 NOTE — Telephone Encounter (Signed)
Pt called she was informed nerve testing shows there is nerve impingement stemming from her low back, which could cause some of her leg pain.  Recommend PT for low back strengthening pt stated she is doing water therapy at [facility], she is asking if that will work for her therapy

## 2022-10-18 NOTE — Telephone Encounter (Signed)
Sure, she can continue this.  If her pain persists, then we can consider sending referral for out-patient PT.

## 2022-10-18 NOTE — Telephone Encounter (Signed)
Pt called informed that she can continue water therapy . If her pain persists, then we can consider sending referral for out-patient PT. Pt stated she will give it a try for 4 weeks and call us back if she needs too

## 2022-10-21 ENCOUNTER — Encounter: Payer: Self-pay | Admitting: *Deleted

## 2022-10-23 ENCOUNTER — Encounter (HOSPITAL_BASED_OUTPATIENT_CLINIC_OR_DEPARTMENT_OTHER): Payer: Self-pay | Admitting: Physical Therapy

## 2022-10-23 ENCOUNTER — Ambulatory Visit (HOSPITAL_BASED_OUTPATIENT_CLINIC_OR_DEPARTMENT_OTHER): Payer: Medicare PPO | Attending: Sports Medicine | Admitting: Physical Therapy

## 2022-10-23 DIAGNOSIS — R2681 Unsteadiness on feet: Secondary | ICD-10-CM | POA: Diagnosis not present

## 2022-10-23 DIAGNOSIS — M6281 Muscle weakness (generalized): Secondary | ICD-10-CM

## 2022-10-23 DIAGNOSIS — R2689 Other abnormalities of gait and mobility: Secondary | ICD-10-CM | POA: Diagnosis not present

## 2022-10-23 NOTE — Therapy (Signed)
OUTPATIENT PHYSICAL THERAPY NEURO  Re-cert   Patient Name: Kaitlyn Flowers MRN: [id number] DOB:04/30/1941, 82 y.o., female Today's Date: 10/23/2022   PCP: Burnis Medin, MD  REFERRING PROVIDER: Stefanie Libel, MD  END OF SESSION:  PT End of Session - 10/23/22 1739     Visit Number 8    Number of Visits 13    Date for PT Re-Evaluation 12/04/22    Authorization Type [employer]    PT Start Time 1716    PT Stop Time 1800    PT Time Calculation (min) 44 min    Activity Tolerance Patient tolerated treatment well;Patient limited by pain    Behavior During Therapy [facility] for tasks assessed/performed                 Past Medical History:  Diagnosis Date   Anemia    Arthritis    Back pain    Cancer ([place])    skin nose   Depression    situational,due to pain   Leg weakness    Osteopenia    PONV (postoperative nausea and vomiting)    Right leg pain    Seizures ([place])    05/13/2019- 4 years ago, "couldnt process" couldnt speak happened 2 times; none since   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   Urinary tract infection    Wears hearing aid    Past Surgical History:  Procedure Laterality Date   ABDOMINAL EXPOSURE N/A 05/17/2019   Procedure: ABDOMINAL EXPOSURE;  Surgeon: Angelia Mould, MD;  Location: [place];  Service: Vascular;  Laterality: N/A;   ABDOMINAL HYSTERECTOMY  1973   1/2 of ovary left cyst on ovary   ANTERIOR LUMBAR FUSION N/A 05/17/2019   Procedure: Lumbar five Sacral one  Anterior lumbar interbody fusion;  Surgeon: Kristeen Miss, MD;  Location: [place];  Service: Neurosurgery;  Laterality: N/A;   APPENDECTOMY     removed with hysterectomy   COLONOSCOPY     LUMBAR WOUND DEBRIDEMENT  12/06/2011   Procedure: LUMBAR WOUND DEBRIDEMENT;  Surgeon: Kristeen Miss, MD;  Location: [place] NEUR[facility];  Service: Neurosurgery;  Laterality: N/A;  Repair of Pseudomeningocele Lumbar wound   ROTATOR CUFF REPAIR     left     shoulder rotator culff  Left 2006   spinal cord tumor  1987   2012 october    Patient Active Problem List   Diagnosis Date Noted   Foot drop, right foot 02/08/2021   Midline cystocele 12/31/2019   Spondylolisthesis at L5-S1 level 05/17/2019   Tension-type headache, not intractable 06/11/2017   Right leg weakness 06/11/2017   Labile hypertension 09/27/2016   Autonomic dysfunction 09/27/2016   Localization-related idiopathic epilepsy and epileptic syndromes with seizures of localized onset, not intractable, without status epilepticus ([place]) 08/01/2016   Hyponatremia 04/01/2016   Nocturnal leg cramps 12/07/2015   Spinal stenosis, lumbar region, 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   Osteoporosis 12/26/2013   Glucosuria 12/16/2012   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/11/2011   Fatigue 11/11/2011   Medicare annual wellness visit, initial 12/18/2010   Wears hearing aid 12/18/2010   Back pain 11/23/2010   ABDOMINAL BRUIT 07/21/2009    ONSET DATE: several years   REFERRING DIAG: M21.371 (ICD-10-CM) - Foot drop, right foot M43.17 (ICD-10-CM) - Spondylolisthesis at L5-S1 level M48.062 (ICD-10-CM) - Spinal stenosis, lumbar region, with neurogenic claudication  THERAPY DIAG:  Unsteadiness on feet - Plan: PT plan of care cert/re-cert  Other abnormalities of gait and mobility - Plan: PT plan of care cert/re-cert  Muscle weakness (generalized) - Plan: PT plan of care cert/re-cert  Rationale for Evaluation and Treatment: Rehabilitation  SUBJECTIVE:                                                                                                                                                                                             SUBJECTIVE STATEMENT: I had the NCV on 2/16 and it has made my pain much worse.  It hasn't settled down since.  I am so mad.  They didn't tell me it was going to hurt. Pt accompanied by: self  PERTINENT HISTORY: Anemia, depression, osteopenia,  seizures, thoracic meningioma with resection and radiation, L5-S1 anterior lumbar interbody fusion 2020  PAIN:  Are you having pain? Yes: NPRS scale: 8/10 Pain location: LB and R hip  Pain description: sharp, dull, aching, pulsing Aggravating factors: sitting Relieving factors: laying down on ice *this is with the medicine  PRECAUTIONS: Fall  WEIGHT BEARING RESTRICTIONS: No  FALLS: Has patient fallen in last 6 months? No  PATIENT GOALS: "want to get some strength back in my legs"  OBJECTIVE:  (From Initial evaluation) DIAGNOSTIC FINDINGS: 11/08/20 lumbar CT: Progressed dextroconvex thoracolumbar scoliosis, grade 1 spondylolisthesis at L5-S1 with solid-appearing fusion, Chronic advanced rightward degeneration at both L4-L5 and L5-S1  NCV 10/18/22 Impression: Chronic radiculopathies affecting left L3, L4, and S1 nerve root/segments, mild. There is no evidence of a large fiber sensorimotor polyneuropathy affecting the left lower extremity.  LOWER EXTREMITY ROM:     Active  Right Eval Left Eval Right / Left 10/23/22  Knee flexion     Knee extension     Ankle dorsiflexion -5 10 -5 / 10  Ankle plantarflexion      (Blank rows = not tested)  LOWER EXTREMITY MMT:    MMT (in sitting) Right Eval Left Eval Right / Left  Hip flexion 3+ 4+ 3 / 4-  Hip extension     Hip abduction 4+ 4+   Hip adduction 4 4+   Knee flexion 4- 4+ 4-/ 4+  Knee extension 2 4+ 2 / 4+  Ankle dorsiflexion 2 4+ 2 / 4  Ankle plantarflexion 4 4+ 4 / 4  (Blank rows = not tested)  GAIT: Gait pattern: R steppage gait with high guarding  Assistive device utilized:  walking pole Level of assistance: SBA  [facility] Adult PT Treatment:  DATE: 10/23/2022 Therapeutic Exercise: Pt seen for aquatic therapy today.  Treatment took place in water 3.25-4.5 ft in depth at the [place]. Temp of water was 91.  Pt entered/exited the pool via stairs step to  pattern with hand rail.  Walking ue supported on white barbell *suspended with yellow noodle under arms: decompression cycling; add/abd and skiing Seated R QL stretching  Pt requires the buoyancy and hydrostatic pressure of water for support, and to offload joints by unweighting joint load by at least 50 % in navel deep water and by at least 75-80% in chest to neck deep water. Viscosity of the water is needed for resistance of strengthening. Water current perturbations provides challenge to standing balance requiring increased core activation.   Objective testing not tolerated today.  Will complete as able.   PATIENT EDUCATION: Education details: HEP initiation Person educated: Patient Education method: Explanation, Demonstration, Tactile cues, and Verbal cues, handout Education comprehension: verbalized understanding, return demonstrated  HOME EXERCISE PROGRAM: Access Code: [email or web address] URL: [email or web address] Date: 09/10/2022 Prepared by: Rudell Cobb  Exercises - Seated Long Arc Quad  - 1 x daily - 2-3 x weekly - 1 sets - 5-10 reps - Supine March  - 1 x daily - 2-3 x weekly - 1 sets - 5-10 reps - Supine Piriformis Stretch with Foot on Ground  - 1 x daily - 2-3 x weekly - 1 sets - 2 reps - 20 seconds hold - Standing Piriformis Release with Ball at [place] [address]  - 1 x daily - 2-3 x weekly - 1 sets - 1 reps  GOALS: Goals reviewed with patient? Yes  SHORT TERM GOALS: Target date: 09/11/2022  Patient to be independent with initial HEP. Baseline: HEP initiated Goal status: MET  LONG TERM GOALS: Target date: 12/04/2022  Patient to be independent with advanced HEP. Baseline: Not yet initiated  Goal status: ongoing 10/23/22  Patient to demonstrate safe stair navigation with 1 handrail as needed. Baseline: pt notes significant difficulty Goal status: ongoing 10/23/22  Patient to complete TUG in <14 sec with LRAD in order to decrease risk of falls.   Baseline: 15.82 Goal  status: ongoing 10/23/22  Patient to demonstrate 5xSTS with reduced UE support in <15 sec in order to decrease risk of falls and improve functional strength.  Baseline: 12.71 sec with B armrests Goal status: ongoing 10/23/22  Patient to score at least 46/56 on Berg in order to decrease risk of falls.  Baseline: 43 Goal status: ongoing 10/23/22  ASSESSMENT: CLINICAL IMPRESSION: Pt presents today for re-assessment.  She had been making good progress towards goals until NCV completed which increased her pain setting her back. She has high level of constant pain with little relief x 5 days. Up until that point she had progressed with improved management of pain and improving strength as well as reports of increased toleration to activity.  Due to flare she is unable to tolerate some objective testing today and minimal progress noted at this time.  She will continue to benefit from skilled aquatic therapy intervention to improve mobility through lengthening  and strengthening core and LE musculature, working on balance and continued progression in management of chronic condition. She will be joining [employer] [facility] this week.    OBJECTIVE IMPAIRMENTS: Abnormal gait, decreased balance, decreased ROM, decreased strength, impaired flexibility, postural dysfunction, and pain.   PLAN:  PT FREQUENCY: 1/w  PT DURATION: 6 weeks  PLANNED INTERVENTIONS: Therapeutic exercises, Therapeutic activity, Neuromuscular re-education, Balance training, Gait training,  Patient/Family education, Self Care, Joint mobilization, Stair training, Vestibular training, Canalith repositioning, Orthotic/Fit training, DME instructions, Aquatic Therapy, Dry Needling, Electrical stimulation, Cryotherapy, Moist heat, Taping, Manual therapy, and Re-evaluation  PLAN FOR NEXT SESSION:  re-cert. Aerobic capacity training .Aquatic HEP   Denton Meek, PTMPT 10/23/22 6:32 PM  Referring diagnosis? M21.371 (ICD-10-CM) - Foot  drop, right foot M43.17 (ICD-10-CM) - Spondylolisthesis at L5-S1 level M48.062 (ICD-10-CM) - Spinal stenosis, lumbar region, with neurogenic claudication Treatment diagnosis? (if different than referring diagnosis)  Unsteadiness on feet Other abnormalities of gait and mobility Muscle weakness (generalized)  What was this (referring dx) caused by? []$  Surgery []$  Fall [x]$  Ongoing issue []$  Arthritis []$  Other: ____________  Laterality: []$  Rt []$  Lt [x]$  Both  Check all possible CPT codes:  *CHOOSE 10 OR LESS*    [x]$  97110 (Therapeutic Exercise)  []$  92507 (SLP Treatment)  [x]$  97112 (Neuro Re-ed)   []$  92526 (Swallowing Treatment)   [x]$  97116 (Gait Training)   []$  64332 (Cognitive Training, 1st 15 minutes) []$  97140 (Manual Therapy)   []$  97130 (Cognitive Training, each add'l 15 minutes)  [x]$  97164 (Re-evaluation)                              []$  Other, List CPT Code ____________  [x]$  [id number] (Therapeutic Activities)     [x]$  97535 (Self Care)   []$  All codes above (97110 - 97535)  []$  97012 (Mechanical Traction)  []$  97014 (E-stim Unattended)  []$  97032 (E-stim manual)  [x]$  97033 (Ionto)  []$  97035 (Ultrasound) [x]$  97750 (Physical Performance Training) [x]$  [id number] (Aquatic Therapy) []$  97016 (Vasopneumatic Device) []$  [id number] (Paraffin) []$  95188 (Contrast Bath) []$  97597 (Wound Care 1st 20 sq cm) []$  97598 (Wound Care each add'l 20 sq cm) []$  97760 (Orthotic Fabrication, Fitting, Training Initial) []$  [id number] (Prosthetic Management and Training Initial) []$  [id number] (Orthotic or Prosthetic Training/ Modification Subsequent)

## 2022-10-24 ENCOUNTER — Other Ambulatory Visit: Payer: Self-pay | Admitting: *Deleted

## 2022-10-24 DIAGNOSIS — M21371 Foot drop, right foot: Secondary | ICD-10-CM

## 2022-10-28 NOTE — Telephone Encounter (Signed)
Prior auth renewal required for [employer]  PA PROCESS DETAILS: PA is required. PA can be initiated by calling [phone] or online at [email or web address]

## 2022-10-29 ENCOUNTER — Ambulatory Visit (HOSPITAL_BASED_OUTPATIENT_CLINIC_OR_DEPARTMENT_OTHER): Payer: Medicare PPO | Admitting: Physical Therapy

## 2022-10-29 ENCOUNTER — Encounter (HOSPITAL_BASED_OUTPATIENT_CLINIC_OR_DEPARTMENT_OTHER): Payer: Self-pay | Admitting: Physical Therapy

## 2022-10-29 DIAGNOSIS — R2689 Other abnormalities of gait and mobility: Secondary | ICD-10-CM | POA: Diagnosis not present

## 2022-10-29 DIAGNOSIS — R2681 Unsteadiness on feet: Secondary | ICD-10-CM

## 2022-10-29 DIAGNOSIS — M6281 Muscle weakness (generalized): Secondary | ICD-10-CM | POA: Diagnosis not present

## 2022-10-29 NOTE — Therapy (Signed)
OUTPATIENT PHYSICAL THERAPY NEURO  Re-cert   Patient Name: Kaitlyn Flowers MRN: [id number] DOB:[birth year], 82 y.o., female Today's Date: 10/29/2022   PCP: Burnis Medin, MD  REFERRING PROVIDER: Stefanie Libel, MD  END OF SESSION:  PT End of Session - 10/29/22 1622     Visit Number 9    Number of Visits 13    Date for PT Re-Evaluation 12/04/22    Authorization Type [employer] Medicare    PT Start Time [id number]    PT Stop Time 1645    PT Time Calculation (min) 29 min    Activity Tolerance Patient tolerated treatment well;Patient limited by pain    Behavior During Therapy WFL for tasks assessed/performed                 Past Medical History:  Diagnosis Date   Anemia    Arthritis    Back pain    Cancer ([place])    skin nose   Depression    situational,due to pain   Leg weakness    Osteopenia    PONV (postoperative nausea and vomiting)    Right leg pain    Seizures ([place])    05/13/2019- 4 years ago, "couldnt process" couldnt speak happened 2 times; none since   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   Urinary tract infection    Wears hearing aid    Past Surgical History:  Procedure Laterality Date   ABDOMINAL EXPOSURE N/A 05/17/2019   Procedure: ABDOMINAL EXPOSURE;  Surgeon: Angelia Mould, MD;  Location: [place];  Service: Vascular;  Laterality: N/A;   ABDOMINAL HYSTERECTOMY  1973   1/2 of ovary left cyst on ovary   ANTERIOR LUMBAR FUSION N/A 05/17/2019   Procedure: Lumbar five Sacral one  Anterior lumbar interbody fusion;  Surgeon: Kristeen Miss, MD;  Location: [place];  Service: Neurosurgery;  Laterality: N/A;   APPENDECTOMY     removed with hysterectomy   COLONOSCOPY     LUMBAR WOUND DEBRIDEMENT  12/06/2011   Procedure: LUMBAR WOUND DEBRIDEMENT;  Surgeon: Kristeen Miss, MD;  Location: [place] [facility];  Service: Neurosurgery;  Laterality: N/A;  Repair of Pseudomeningocele Lumbar wound   ROTATOR CUFF REPAIR     left     shoulder rotator culff  Left 2006   spinal cord tumor  1987   2012 october    Patient Active Problem List   Diagnosis Date Noted   Foot drop, right foot 02/08/2021   Midline cystocele 12/31/2019   Spondylolisthesis at L5-S1 level 05/17/2019   Tension-type headache, not intractable 06/11/2017   Right leg weakness 06/11/2017   Labile hypertension 09/27/2016   Autonomic dysfunction 09/27/2016   Localization-related idiopathic epilepsy and epileptic syndromes with seizures of localized onset, not intractable, without status epilepticus ([place]) 08/01/2016   Hyponatremia 04/01/2016   Nocturnal leg cramps 12/07/2015   Spinal stenosis, lumbar region, 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   Osteoporosis 12/26/2013   Glucosuria 12/16/2012   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/11/2011   Fatigue 11/11/2011   Medicare annual wellness visit, initial 12/18/2010   Wears hearing aid 12/18/2010   Back pain 11/23/2010   ABDOMINAL BRUIT 07/21/2009    ONSET DATE: several years   REFERRING DIAG: M21.371 (ICD-10-CM) - Foot drop, right foot M43.17 (ICD-10-CM) - Spondylolisthesis at L5-S1 level M48.062 (ICD-10-CM) - Spinal stenosis, lumbar region, with neurogenic claudication  THERAPY DIAG:  Unsteadiness on feet  Other abnormalities of gait and mobility  Muscle weakness (generalized)  Rationale for Evaluation and Treatment: Rehabilitation  SUBJECTIVE:                                                                                                                                                                                             SUBJECTIVE STATEMENT: "I did finally get rid of the pain from the NCV test a few days ago but for some reason my back pain spiked a few hours ago. I wasn't doing anything different." (6/10 normal pain to 8/10) Pt accompanied by: self  PERTINENT HISTORY: Anemia, depression, osteopenia, seizures, thoracic meningioma with resection and radiation, L5-S1 anterior  lumbar interbody fusion 2020  PAIN:  Are you having pain? Yes: NPRS scale: 8/10 Pain location: LB and R hip  Pain description: sharp, dull, aching, pulsing Aggravating factors: sitting Relieving factors: laying down on ice *this is with the medicine  PRECAUTIONS: Fall  WEIGHT BEARING RESTRICTIONS: No  FALLS: Has patient fallen in last 6 months? No  PATIENT GOALS: "want to get some strength back in my legs"  OBJECTIVE:  (From Initial evaluation) DIAGNOSTIC FINDINGS: 11/08/20 lumbar CT: Progressed dextroconvex thoracolumbar scoliosis, grade 1 spondylolisthesis at L5-S1 with solid-appearing fusion, Chronic advanced rightward degeneration at both L4-L5 and L5-S1  NCV 10/18/22 Impression: Chronic radiculopathies affecting left L3, L4, and S1 nerve root/segments, mild. There is no evidence of a large fiber sensorimotor polyneuropathy affecting the left lower extremity.  LOWER EXTREMITY ROM:     Active  Right Eval Left Eval Right / Left 10/23/22  Knee flexion     Knee extension     Ankle dorsiflexion -5 10 -5 / 10  Ankle plantarflexion      (Blank rows = not tested)  LOWER EXTREMITY MMT:    MMT (in sitting) Right Eval Left Eval Right / Left  Hip flexion 3+ 4+ 3 / 4-  Hip extension     Hip abduction 4+ 4+   Hip adduction 4 4+   Knee flexion 4- 4+ 4-/ 4+  Knee extension 2 4+ 2 / 4+  Ankle dorsiflexion 2 4+ 2 / 4  Ankle plantarflexion 4 4+ 4 / 4  (Blank rows = not tested)  GAIT: Gait pattern: R steppage gait with high guarding  Assistive device utilized:  walking pole Level of assistance: SBA  [facility] Adult PT Treatment:                                                DATE: 10/23/2022  Therapeutic Exercise: Pt seen for aquatic therapy today.  Treatment took place in water 3.25-4.5 ft in depth at the [place]. Temp of water was 91.  Pt entered/exited the pool via stairs step to pattern with hand rail.  Walking unsupported forward back and side  stepping *suspended with yellow noodle under arms: decompression cycling; add/abd and skiing *Yellow hand buoys rue submerged at sides side bending 2x 10, LUE supported on floating KB.  *Left QL stretching *forward and backward walking 1 foam hand buoys submerged to side  Pt requires the buoyancy and hydrostatic pressure of water for support, and to offload joints by unweighting joint load by at least 50 % in navel deep water and by at least 75-80% in chest to neck deep water. Viscosity of the water is needed for resistance of strengthening. Water current perturbations provides challenge to standing balance requiring increased core activation.   Objective testing not tolerated today.  Will complete as able.   PATIENT EDUCATION: Education details: HEP initiation Person educated: Patient Education method: Explanation, Demonstration, Tactile cues, and Verbal cues, handout Education comprehension: verbalized understanding, return demonstrated  HOME EXERCISE PROGRAM: Access Code: [email or web address] URL: [email or web address] Date: 09/10/2022 Prepared by: Rudell Cobb  Exercises - Seated Long Arc Quad  - 1 x daily - 2-3 x weekly - 1 sets - 5-10 reps - Supine March  - 1 x daily - 2-3 x weekly - 1 sets - 5-10 reps - Supine Piriformis Stretch with Foot on Ground  - 1 x daily - 2-3 x weekly - 1 sets - 2 reps - 20 seconds hold - Standing Piriformis Release with Ball at [employer]  - 1 x daily - 2-3 x weekly - 1 sets - 1 reps    Added Aquatics : some land exercises adapted for water:  - Standing 'L' Stretch at [employer]  - 1 x daily - 7 x weekly - 3 sets - 10 reps - TL Sidebending Stretch - Single Arm Overhead  - 1 x daily - 7 x weekly - 3 sets - 10 reps - Standing Bilateral Low Shoulder Row with Anchored Resistance  - 1 x daily - 7 x weekly - 3 sets - 10 reps - Shoulder Extension with Resistance  - 1 x daily - 7 x weekly - 3 sets - 10 reps - Kettlebell Suitcase Carry  - 1 x daily - 7 x weekly  - 3 sets - 10 reps - Standing Sidebends  - 1 x daily - 7 x weekly - 3 sets - 10 reps - Sit to Stand  - 1 x daily - 7 x weekly - 3 sets - 10 reps - Squat  - 1 x daily - 7 x weekly - 3 sets - 10 reps   GOALS: Goals reviewed with patient? Yes  SHORT TERM GOALS: Target date: 09/11/2022  Patient to be independent with initial HEP. Baseline: HEP initiated Goal status: MET  LONG TERM GOALS: Target date: 12/04/2022  Patient to be independent with advanced HEP. Baseline: Not yet initiated  Goal status: ongoing 10/23/22  Patient to demonstrate safe stair navigation with 1 handrail as needed. Baseline: pt notes significant difficulty Goal status: ongoing 10/23/22  Patient to complete TUG in <14 sec with LRAD in order to decrease risk of falls.   Baseline: 15.82 Goal status: ongoing 10/23/22  Patient to demonstrate 5xSTS with reduced UE support in <15 sec in order to decrease risk of falls and improve functional strength.  Baseline: 12.71 sec with  B armrests Goal status: ongoing 10/23/22  Patient to score at least 46/56 on Berg in order to decrease risk of falls.  Baseline: 43 Goal status: ongoing 10/23/22  ASSESSMENT: CLINICAL IMPRESSION: Spike in pain a few hours ago at home. Initiated session with decompression positioning to alleviate discomfort. Progressed through exercises as tolerated with reports of pain increasing.   Pt opted to forgo remainder of session.  Will continue to progress next visit. Goals ongoing   OBJECTIVE IMPAIRMENTS: Abnormal gait, decreased balance, decreased ROM, decreased strength, impaired flexibility, postural dysfunction, and pain.   PLAN:  PT FREQUENCY: 1/w  PT DURATION: 6 weeks  PLANNED INTERVENTIONS: Therapeutic exercises, Therapeutic activity, Neuromuscular re-education, Balance training, Gait training, Patient/Family education, Self Care, Joint mobilization, Stair training, Vestibular training, Canalith repositioning, Orthotic/Fit training, DME  instructions, Aquatic Therapy, Dry Needling, Electrical stimulation, Cryotherapy, Moist heat, Taping, Manual therapy, and Re-evaluation  PLAN FOR NEXT SESSION:   Aerobic capacity training .Aquatic HEP   Denton Meek, PTMPT 10/29/22 5:03 PM  Referring diagnosis? M21.371 (ICD-10-CM) - Foot drop, right foot M43.17 (ICD-10-CM) - Spondylolisthesis at L5-S1 level M48.062 (ICD-10-CM) - Spinal stenosis, lumbar region, with neurogenic claudication Treatment diagnosis? (if different than referring diagnosis)  Unsteadiness on feet Other abnormalities of gait and mobility Muscle weakness (generalized)  What was this (referring dx) caused by? '[]'$  Surgery '[]'$  Fall '[x]'$  Ongoing issue '[]'$  Arthritis '[]'$  Other: ____________  Laterality: '[]'$  Rt '[]'$  Lt '[x]'$  Both  Check all possible CPT codes:  *CHOOSE 10 OR LESS*    '[x]'$  97110 (Therapeutic Exercise)  '[]'$  92507 (SLP Treatment)  '[x]'$  97112 (Neuro Re-ed)   '[]'$  92526 (Swallowing Treatment)   '[x]'$  97116 (Gait Training)   '[]'$  [id number] (Cognitive Training, 1st 15 minutes) '[]'$  97140 (Manual Therapy)   '[]'$  97130 (Cognitive Training, each add'l 15 minutes)  '[x]'$  97164 (Re-evaluation)                              '[]'$  Other, List CPT Code ____________  '[x]'$  97530 (Therapeutic Activities)     '[x]'$  97535 (Self Care)   '[]'$  All codes above (97110 - 97535)  '[]'$  97012 (Mechanical Traction)  '[]'$  97014 (E-stim Unattended)  '[]'$  97032 (E-stim manual)  '[x]'$  97033 (Ionto)  '[]'$  97035 (Ultrasound) '[x]'$  97750 (Physical Performance Training) '[x]'$  [id number] (Aquatic Therapy) '[]'$  97016 (Vasopneumatic Device) '[]'$  [id number] (Paraffin) '[]'$  13086 (Contrast Bath) '[]'$  97597 (Wound Care 1st 20 sq cm) '[]'$  97598 (Wound Care each add'l 20 sq cm) '[]'$  97760 (Orthotic Fabrication, Fitting, Training Initial) '[]'$  [id number] (Prosthetic Management and Training Initial) '[]'$  [id number] (Orthotic or Prosthetic Training/ Modification Subsequent)

## 2022-11-04 NOTE — Telephone Encounter (Addendum)
Prior Authorization initiated for [employer] via [email or web address] KEY: [id number]

## 2022-11-07 ENCOUNTER — Ambulatory Visit (HOSPITAL_BASED_OUTPATIENT_CLINIC_OR_DEPARTMENT_OTHER): Payer: Medicare PPO | Admitting: Physical Therapy

## 2022-11-11 NOTE — Telephone Encounter (Signed)
Pt ready for scheduling on or after 11/01/22  Out-of-pocket cost due at time of visit: $0  Primary: [employer] [place] PPO [employer] co-insurance: 0% Admin fee co-insurance: 0%  Deductible: does not apply  Prior Auth: APPROVED PA Case ID #: [id number] Valid: 11/12/19-09/02/23  Secondary: n/a [employer] co-insurance:  Admin fee co-insurance:   Deductible: n/a  Prior Auth: n/a PA# Valid:     ** This summary of benefits is an estimation of the patient's out-of-pocket cost. Exact cost may very based on individual plan coverage.

## 2022-11-11 NOTE — Telephone Encounter (Signed)
APPROVED  Key: [id number] PA Case ID #: [id number] Valid: 11/12/19-09/02/23

## 2022-11-12 ENCOUNTER — Ambulatory Visit (HOSPITAL_BASED_OUTPATIENT_CLINIC_OR_DEPARTMENT_OTHER): Payer: Medicare PPO | Attending: Sports Medicine | Admitting: Physical Therapy

## 2022-11-12 ENCOUNTER — Encounter (HOSPITAL_BASED_OUTPATIENT_CLINIC_OR_DEPARTMENT_OTHER): Payer: Self-pay | Admitting: Physical Therapy

## 2022-11-12 DIAGNOSIS — M6281 Muscle weakness (generalized): Secondary | ICD-10-CM | POA: Insufficient documentation

## 2022-11-12 DIAGNOSIS — R2689 Other abnormalities of gait and mobility: Secondary | ICD-10-CM | POA: Diagnosis not present

## 2022-11-12 DIAGNOSIS — R2681 Unsteadiness on feet: Secondary | ICD-10-CM | POA: Insufficient documentation

## 2022-11-12 NOTE — Therapy (Signed)
OUTPATIENT PHYSICAL THERAPY NEURO     Patient Name: Kaitlyn Flowers MRN: [id number] DOB:07/02/1941, 82 y.o., female Today's Date: 11/12/2022   PCP: Burnis Medin, MD  REFERRING PROVIDER: Stefanie Libel, MD  END OF SESSION:  PT End of Session - 11/12/22 1814     Visit Number 10    Number of Visits 13    Date for PT Re-Evaluation 12/04/22    Authorization Type [employer]    Progress Note Due on Visit 17    PT Start Time 1616    PT Stop Time 1700    PT Time Calculation (min) 44 min    Activity Tolerance Patient tolerated treatment well;Patient limited by pain    Behavior During Therapy WFL for tasks assessed/performed                  Past Medical History:  Diagnosis Date   Anemia    Arthritis    Back pain    Cancer ([place])    skin nose   Depression    situational,due to pain   Leg weakness    Osteopenia    PONV (postoperative nausea and vomiting)    Right leg pain    Seizures ([place])    05/13/2019- 4 years ago, "couldnt process" couldnt speak happened 2 times; none since   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   Urinary tract infection    Wears hearing aid    Past Surgical History:  Procedure Laterality Date   ABDOMINAL EXPOSURE N/A 05/17/2019   Procedure: ABDOMINAL EXPOSURE;  Surgeon: Angelia Mould, MD;  Location: [facility];  Service: Vascular;  Laterality: N/A;   ABDOMINAL HYSTERECTOMY  1973   1/2 of ovary left cyst on ovary   ANTERIOR LUMBAR FUSION N/A 05/17/2019   Procedure: Lumbar five Sacral one  Anterior lumbar interbody fusion;  Surgeon: Kristeen Miss, MD;  Location: [facility];  Service: Neurosurgery;  Laterality: N/A;   APPENDECTOMY     removed with hysterectomy   COLONOSCOPY     LUMBAR WOUND DEBRIDEMENT  12/06/2011   Procedure: LUMBAR WOUND DEBRIDEMENT;  Surgeon: Kristeen Miss, MD;  Location: [facility];  Service: Neurosurgery;  Laterality: N/A;  Repair of Pseudomeningocele Lumbar wound    ROTATOR CUFF REPAIR     left    shoulder rotator culff  Left 2006   spinal cord tumor  1987   2012 october    Patient Active Problem List   Diagnosis Date Noted   Foot drop, right foot 02/08/2021   Midline cystocele 12/31/2019   Spondylolisthesis at L5-S1 level 05/17/2019   Tension-type headache, not intractable 06/11/2017   Right leg weakness 06/11/2017   Labile hypertension 09/27/2016   Autonomic dysfunction 09/27/2016   Localization-related idiopathic epilepsy and epileptic syndromes with seizures of localized onset, not intractable, without status epilepticus ([place]) 08/01/2016   Hyponatremia 04/01/2016   Nocturnal leg cramps 12/07/2015   Spinal stenosis, lumbar region, 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   Osteoporosis 12/26/2013   Glucosuria 12/16/2012   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/11/2011   Fatigue 11/11/2011   Medicare annual wellness visit, initial 12/18/2010   Wears hearing aid 12/18/2010   Back pain 11/23/2010   ABDOMINAL BRUIT 07/21/2009    ONSET DATE: several years   REFERRING DIAG: M21.371 (ICD-10-CM) - Foot drop, right foot M43.17 (ICD-10-CM) - Spondylolisthesis at L5-S1 level M48.062 (ICD-10-CM) - Spinal stenosis, lumbar region, with neurogenic claudication  THERAPY DIAG:  Unsteadiness on feet  Other abnormalities of gait and mobility  Muscle weakness (generalized)  Rationale for Evaluation and Treatment: Rehabilitation  SUBJECTIVE:                                                                                                                                                                                             SUBJECTIVE STATEMENT: "Doing better" Pt accompanied by: self  PERTINENT HISTORY: Anemia, depression, osteopenia, seizures, thoracic meningioma with resection and radiation, L5-S1 anterior lumbar interbody fusion 2020  PAIN:  Are you having pain? Yes: NPRS scale: 7/10 Pain location: LB and R hip  Pain description: sharp,  dull, aching, pulsing Aggravating factors: sitting Relieving factors: laying down on ice *this is with the medicine  PRECAUTIONS: Fall  WEIGHT BEARING RESTRICTIONS: No  FALLS: Has patient fallen in last 6 months? No  PATIENT GOALS: "want to get some strength back in my legs"  OBJECTIVE:  (From Initial evaluation) DIAGNOSTIC FINDINGS: 11/08/20 lumbar CT: Progressed dextroconvex thoracolumbar scoliosis, grade 1 spondylolisthesis at L5-S1 with solid-appearing fusion, Chronic advanced rightward degeneration at both L4-L5 and L5-S1  NCV 10/18/22 Impression: Chronic radiculopathies affecting left L3, L4, and S1 nerve root/segments, mild. There is no evidence of a large fiber sensorimotor polyneuropathy affecting the left lower extremity.  LOWER EXTREMITY ROM:     Active  Right Eval Left Eval Right / Left 10/23/22  Knee flexion     Knee extension     Ankle dorsiflexion -5 10 -5 / 10  Ankle plantarflexion      (Blank rows = not tested)  LOWER EXTREMITY MMT:    MMT (in sitting) Right Eval Left Eval Right / Left  Hip flexion 3+ 4+ 3 / 4-  Hip extension     Hip abduction 4+ 4+   Hip adduction 4 4+   Knee flexion 4- 4+ 4-/ 4+  Knee extension 2 4+ 2 / 4+  Ankle dorsiflexion 2 4+ 2 / 4  Ankle plantarflexion 4 4+ 4 / 4  (Blank rows = not tested)  GAIT: Gait pattern: R steppage gait with high guarding  Assistive device utilized:  walking pole Level of assistance: SBA  [facility] Adult PT Treatment:                                                DATE: 10/23/2022 Therapeutic Exercise: Pt seen for aquatic therapy today.  Treatment took place in water 3.25-4.5 ft [facility] Temp of  water was 91.  Pt entered/exited the pool via stairs step to pattern with hand rail.  Walking unsupported forward back and side stepping *suspended with yellow noodle under arms: decompression cycling; add/abd and skiing *Yellow hand buoys rue submerged at sides side bending  3x 10, LUE supported on floating KB.  *Left QL stretching *forward and backward walking 2 foam hand buoys submerged to right side  Pt requires the buoyancy and hydrostatic pressure of water for support, and to offload joints by unweighting joint load by at least 50 % in navel deep water and by at least 75-80% in chest to neck deep water. Viscosity of the water is needed for resistance of strengthening. Water current perturbations provides challenge to standing balance requiring increased core activation.   Objective testing not tolerated today.  Will complete as able.   PATIENT EDUCATION: Education details: HEP initiation Person educated: Patient Education method: Explanation, Demonstration, Tactile cues, and Verbal cues, handout Education comprehension: verbalized understanding, return demonstrated  HOME EXERCISE PROGRAM: Access Code: [email or web address] URL: [email or web address] Date: 09/10/2022 Prepared by: Rudell Cobb  Exercises - Seated Long Arc Quad  - 1 x daily - 2-3 x weekly - 1 sets - 5-10 reps - Supine March  - 1 x daily - 2-3 x weekly - 1 sets - 5-10 reps - Supine Piriformis Stretch with Foot on Ground  - 1 x daily - 2-3 x weekly - 1 sets - 2 reps - 20 seconds hold - Standing Piriformis Release with Ball at [employer]  - 1 x daily - 2-3 x weekly - 1 sets - 1 reps    Added Aquatics : some land exercises adapted for water:  - Standing 'L' Stretch at [employer]  - 1 x daily - 7 x weekly - 3 sets - 10 reps - TL Sidebending Stretch - Single Arm Overhead  - 1 x daily - 7 x weekly - 3 sets - 10 reps - Standing Bilateral Low Shoulder Row with Anchored Resistance  - 1 x daily - 7 x weekly - 3 sets - 10 reps - Shoulder Extension with Resistance  - 1 x daily - 7 x weekly - 3 sets - 10 reps - Kettlebell Suitcase Carry  - 1 x daily - 7 x weekly - 3 sets - 10 reps - Standing Sidebends  - 1 x daily - 7 x weekly - 3 sets - 10 reps - Sit to Stand  - 1 x daily - 7 x weekly - 3 sets - 10  reps - Squat  - 1 x daily - 7 x weekly - 3 sets - 10 reps -supine gluteal set   GOALS: Goals reviewed with patient? Yes  SHORT TERM GOALS: Target date: 09/11/2022  Patient to be independent with initial HEP. Baseline: HEP initiated Goal status: MET  LONG TERM GOALS: Target date: 12/04/2022  Patient to be independent with advanced HEP. Baseline: Not yet initiated  Goal status: ongoing 10/23/22  Patient to demonstrate safe stair navigation with 1 handrail as needed. Baseline: pt notes significant difficulty Goal status: ongoing 10/23/22  Patient to complete TUG in <14 sec with LRAD in order to decrease risk of falls.   Baseline: 15.82 Goal status: ongoing 10/23/22  Patient to demonstrate 5xSTS with reduced UE support in <15 sec in order to decrease risk of falls and improve functional strength.  Baseline: 12.71 sec with B armrests Goal status: ongoing 10/23/22  Patient to score at least 46/56 on Berg in order to decrease risk of  falls.  Baseline: 43 Goal status: ongoing 10/23/22  ASSESSMENT: CLINICAL IMPRESSION: Progressed with exercises for core stabilization. Initiated additional exercises  to improve isolated movements of glut and hamstring in suspended supine which she tolerates well. She requires min assist to gain position as well as demonstration for proper execution.  As she tires she loses proper positioning and is instructed to discontinue exercises.  She vu of importance of maintaining proper positioning for optimal muscle engagement of targeted muscles.  HEP laminated and issued but not yet complete. She will bring back.  Spike in pain a few hours ago at home. Initiated session with decompression positioning to alleviate discomfort. Progressed through exercises as tolerated with reports of pain increasing.   Pt opted to forgo remainder of session.  Will continue to progress next visit. Goals ongoing   OBJECTIVE IMPAIRMENTS: Abnormal gait, decreased balance, decreased ROM,  decreased strength, impaired flexibility, postural dysfunction, and pain.   PLAN:  PT FREQUENCY: 1/w  PT DURATION: 6 weeks  PLANNED INTERVENTIONS: Therapeutic exercises, Therapeutic activity, Neuromuscular re-education, Balance training, Gait training, Patient/Family education, Self Care, Joint mobilization, Stair training, Vestibular training, Canalith repositioning, Orthotic/Fit training, DME instructions, Aquatic Therapy, Dry Needling, Electrical stimulation, Cryotherapy, Moist heat, Taping, Manual therapy, and Re-evaluation  PLAN FOR NEXT SESSION:   Aerobic capacity training .Aquatic HEP   Denton Meek, PTMPT 11/12/22 6:15 PM  Referring diagnosis? M21.371 (ICD-10-CM) - Foot drop, right foot M43.17 (ICD-10-CM) - Spondylolisthesis at L5-S1 level M48.062 (ICD-10-CM) - Spinal stenosis, lumbar region, with neurogenic claudication Treatment diagnosis? (if different than referring diagnosis)  Unsteadiness on feet Other abnormalities of gait and mobility Muscle weakness (generalized)  What was this (referring dx) caused by? '[]'$  Surgery '[]'$  Fall '[x]'$  Ongoing issue '[]'$  Arthritis '[]'$  Other: ____________  Laterality: '[]'$  Rt '[]'$  Lt '[x]'$  Both  Check all possible CPT codes:  *CHOOSE 10 OR LESS*    '[x]'$  97110 (Therapeutic Exercise)  '[]'$  92507 (SLP Treatment)  '[x]'$  97112 (Neuro Re-ed)   '[]'$  92526 (Swallowing Treatment)   '[x]'$  97116 (Gait Training)   '[]'$  [id number] (Cognitive Training, 1st 15 minutes) '[]'$  97140 (Manual Therapy)   '[]'$  97130 (Cognitive Training, each add'l 15 minutes)  '[x]'$  97164 (Re-evaluation)                              '[]'$  Other, List CPT Code ____________  '[x]'$  97530 (Therapeutic Activities)     '[x]'$  97535 (Self Care)   '[]'$  All codes above (97110 - 97535)  '[]'$  97012 (Mechanical Traction)  '[]'$  97014 (E-stim Unattended)  '[]'$  97032 (E-stim manual)  '[x]'$  97033 (Ionto)  '[]'$  97035 (Ultrasound) '[x]'$  97750 (Physical Performance Training) '[x]'$  [id number] (Aquatic Therapy) '[]'$  97016 (Vasopneumatic  Device) '[]'$  [id number] (Paraffin) '[]'$  97034 (Contrast Bath) '[]'$  97597 (Wound Care 1st 20 sq cm) '[]'$  97598 (Wound Care each add'l 20 sq cm) '[]'$  97760 (Orthotic Fabrication, Fitting, Training Initial) '[]'$  [id number] (Prosthetic Management and Training Initial) '[]'$  [id number] (Orthotic or Prosthetic Training/ Modification Subsequent)

## 2022-11-19 ENCOUNTER — Ambulatory Visit (HOSPITAL_BASED_OUTPATIENT_CLINIC_OR_DEPARTMENT_OTHER): Payer: Self-pay | Admitting: Physical Therapy

## 2022-11-26 NOTE — Therapy (Unsigned)
OUTPATIENT PHYSICAL THERAPY NEURO  PHYSICAL THERAPY DISCHARGE SUMMARY  Visits from Start of Care: 11  Current functional level related to goals / functional outcomes: Pt is safe and indep with functional mobility and ADL's with AD's   Remaining deficits: Chronic pain   Education / Equipment: Management of condition; HEP   Patient agrees to discharge. Patient goals were partially met. Patient is being discharged due to maximized rehab potential.     Patient Name: Kaitlyn Flowers MRN: [id number] DOB:21-Oct-1940, 82 y.o., female Today's Date: 11/27/2022   PCP: Burnis Medin, MD  REFERRING PROVIDER: Stefanie Libel, MD  END OF SESSION:  PT End of Session - 11/27/22 1740     Visit Number 11    Number of Visits 13    Date for PT Re-Evaluation 12/04/22    Authorization Type [employer]    Progress Note Due on Visit 17    PT Start Time 1716    PT Stop Time 1800    PT Time Calculation (min) 44 min    Activity Tolerance Patient tolerated treatment well;Patient limited by pain    Behavior During Therapy [facility] for tasks assessed/performed                   Past Medical History:  Diagnosis Date   Anemia    Arthritis    Back pain    Cancer ([place])    skin nose   Depression    situational,due to pain   Leg weakness    Osteopenia    PONV (postoperative nausea and vomiting)    Right leg pain    Seizures ([place])    05/13/2019- 4 years ago, "couldnt process" couldnt speak happened 2 times; none since   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   Urinary tract infection    Wears hearing aid    Past Surgical History:  Procedure Laterality Date   ABDOMINAL EXPOSURE N/A 05/17/2019   Procedure: ABDOMINAL EXPOSURE;  Surgeon: Angelia Mould, MD;  Location: [facility];  Service: Vascular;  Laterality: N/A;   ABDOMINAL HYSTERECTOMY  1973   1/2 of ovary left cyst on ovary   ANTERIOR LUMBAR FUSION N/A 05/17/2019   Procedure:  Lumbar five Sacral one  Anterior lumbar interbody fusion;  Surgeon: Kristeen Miss, MD;  Location: [facility];  Service: Neurosurgery;  Laterality: N/A;   APPENDECTOMY     removed with hysterectomy   COLONOSCOPY     LUMBAR WOUND DEBRIDEMENT  12/06/2011   Procedure: LUMBAR WOUND DEBRIDEMENT;  Surgeon: Kristeen Miss, MD;  Location: [place];  Service: Neurosurgery;  Laterality: N/A;  Repair of Pseudomeningocele Lumbar wound   ROTATOR CUFF REPAIR     left    shoulder rotator culff  Left 2006   spinal cord tumor  1987   2012 october    Patient Active Problem List   Diagnosis Date Noted   Foot drop, right foot 02/08/2021   Midline cystocele 12/31/2019   Spondylolisthesis at L5-S1 level 05/17/2019   Tension-type headache, not intractable 06/11/2017   Right leg weakness 06/11/2017   Labile hypertension 09/27/2016   Autonomic dysfunction 09/27/2016   Localization-related idiopathic epilepsy and epileptic syndromes with seizures of localized onset, not intractable, without status epilepticus ([place]) 08/01/2016   Hyponatremia 04/01/2016   Nocturnal leg cramps 12/07/2015   Spinal stenosis, lumbar region, 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   Osteoporosis 12/26/2013   Glucosuria 12/16/2012   Hx of benign neoplasm of  spinal cord 11/11/2011   Fatigue 11/11/2011   Medicare annual wellness visit, initial 12/18/2010   Wears hearing aid 12/18/2010   Back pain 11/23/2010   ABDOMINAL BRUIT 07/21/2009    ONSET DATE: several years   REFERRING DIAG: M21.371 (ICD-10-CM) - Foot drop, right foot M43.17 (ICD-10-CM) - Spondylolisthesis at L5-S1 level M48.062 (ICD-10-CM) - Spinal stenosis, lumbar region, with neurogenic claudication  THERAPY DIAG:  Unsteadiness on feet  Other abnormalities of gait and mobility  Muscle weakness (generalized)  Rationale for Evaluation and Treatment: Rehabilitation  SUBJECTIVE:                                                                                                                                                                                              SUBJECTIVE STATEMENT: "My pain has just been bad, I just feel like lying down." Pt accompanied by: self  PERTINENT HISTORY: Anemia, depression, osteopenia, seizures, thoracic meningioma with resection and radiation, L5-S1 anterior lumbar interbody fusion 2020  PAIN:  Are you having pain? Yes: NPRS scale: 7/10 Pain location: LB and R hip  Pain description: sharp, dull, aching, pulsing Aggravating factors: sitting Relieving factors: laying down on ice *this is with the medicine  PRECAUTIONS: Fall  WEIGHT BEARING RESTRICTIONS: No  FALLS: Has patient fallen in last 6 months? No  PATIENT GOALS: "want to get some strength back in my legs"  OBJECTIVE:  (From Initial evaluation) DIAGNOSTIC FINDINGS: 11/08/20 lumbar CT: Progressed dextroconvex thoracolumbar scoliosis, grade 1 spondylolisthesis at L5-S1 with solid-appearing fusion, Chronic advanced rightward degeneration at both L4-L5 and L5-S1  NCV 10/18/22 Impression: Chronic radiculopathies affecting left L3, L4, and S1 nerve root/segments, mild. There is no evidence of a large fiber sensorimotor polyneuropathy affecting the left lower extremity.  LOWER EXTREMITY ROM:     Active  Right Eval Left Eval Right / Left 10/23/22  Knee flexion     Knee extension     Ankle dorsiflexion -5 10 -5 / 10  Ankle plantarflexion      (Blank rows = not tested)  LOWER EXTREMITY MMT:    MMT (in sitting) Right Eval Left Eval Right / Left  Hip flexion 3+ 4+ 3 / 4-  Hip extension     Hip abduction 4+ 4+   Hip adduction 4 4+   Knee flexion 4- 4+ 4-/ 4+  Knee extension 2 4+ 2 / 4+  Ankle dorsiflexion 2 4+ 2 / 4  Ankle plantarflexion 4 4+ 4 / 4  (Blank rows = not tested)  GAIT: Gait pattern: R steppage gait with high guarding  Assistive device utilized:  walking pole Level of assistance:  SBA  [facility] Adult PT Treatment:                                                 DATE: 10/23/2022 Therapeutic Exercise: Pt seen for aquatic therapy today.  Treatment took place in water 3.25-4.5 ft in depth at the [facility]. Temp of water was 91.  Pt entered/exited the pool via stairs step to pattern with hand rail.  Walking unsupported forward back and side stepping *Side bending right with yellow HB submerged on right lue resting on KB *Yellow hand buoys rue submerged at sides side bending 3x 10, LUE supported on floating KB.  *Plank with hip extension R/L x10 *forward and backward walking 2 foam hand buoys submerged to right side *KB row *Noodle press  *supine suspension with noodles: hip extension  Pt requires the buoyancy and hydrostatic pressure of water for support, and to offload joints by unweighting joint load by at least 50 % in navel deep water and by at least 75-80% in chest to neck deep water. Viscosity of the water is needed for resistance of strengthening. Water current perturbations provides challenge to standing balance requiring increased core activation.     PATIENT EDUCATION: Education details: HEP initiation Person educated: Patient Education method: Explanation, Demonstration, Tactile cues, and Verbal cues, handout Education comprehension: verbalized understanding, return demonstrated  HOME EXERCISE PROGRAM: Access Code: [email or web address] URL: [email or web address] Date: 09/10/2022 Prepared by: Rudell Cobb  Exercises - Seated Long Arc Quad  - 1 x daily - 2-3 x weekly - 1 sets - 5-10 reps - Supine March  - 1 x daily - 2-3 x weekly - 1 sets - 5-10 reps - Supine Piriformis Stretch with Foot on Ground  - 1 x daily - 2-3 x weekly - 1 sets - 2 reps - 20 seconds hold - Standing Piriformis Release with Ball at [employer]  - 1 x daily - 2-3 x weekly - 1 sets - 1 reps    Added Aquatics : some land exercises adapted for water:  - Standing 'L' Stretch at [employer]  - 1 x daily - 7 x weekly - 3 sets - 10  reps - TL Sidebending Stretch - Single Arm Overhead  - 1 x daily - 7 x weekly - 3 sets - 10 reps - Standing Bilateral Low Shoulder Row with Anchored Resistance  - 1 x daily - 7 x weekly - 3 sets - 10 reps - Shoulder Extension with Resistance  - 1 x daily - 7 x weekly - 3 sets - 10 reps - Kettlebell Suitcase Carry  - 1 x daily - 7 x weekly - 3 sets - 10 reps - Standing Sidebends  - 1 x daily - 7 x weekly - 3 sets - 10 reps - Sit to Stand  - 1 x daily - 7 x weekly - 3 sets - 10 reps - Squat  - 1 x daily - 7 x weekly - 3 sets - 10 reps -supine gluteal set   GOALS: Goals reviewed with patient? Yes  SHORT TERM GOALS: Target date: 09/11/2022  Patient to be independent with initial HEP. Baseline: HEP initiated Goal status: MET  LONG TERM GOALS: Target date: 12/04/2022  Patient to be independent with advanced HEP. Baseline: Not yet initiated  Goal status: ongoing 10/23/22; met 11/27/22  Patient to demonstrate safe stair navigation  with 1 handrail as needed. Baseline: pt notes significant difficulty Goal status: ongoing 10/23/22  Patient to complete TUG in <14 sec with LRAD in order to decrease risk of falls.   Baseline: 15.82 Goal status: ongoing 10/23/22: not met Pt declines testing due to pain  Patient to demonstrate 5xSTS with reduced UE support in <15 sec in order to decrease risk of falls and improve functional strength.  Baseline: 12.71 sec with B armrests Goal status: ongoing 10/23/22; Not met Pt declines testing due to pain  Patient to score at least 46/56 on Berg in order to decrease risk of falls.  Baseline: 43 Goal status: ongoing 10/23/22; not met Pt declines testing due to pain  ASSESSMENT: CLINICAL IMPRESSION: Pt reports missing last session due to consistent pain.  She is directed through final aquatic HEP instructing on proper execution and target movements/muscles.  She completes well following issued laminated program.  She is encouraged to be complaint as able. She does  have a membership here at [employer].  She declines final functional testing due to pain today.  She has reached her max potential and is ready for DC.  OBJECTIVE IMPAIRMENTS: Abnormal gait, decreased balance, decreased ROM, decreased strength, impaired flexibility, postural dysfunction, and pain.   PLAN:  PT FREQUENCY: 1/w  PT DURATION: 6 weeks  PLANNED INTERVENTIONS: Therapeutic exercises, Therapeutic activity, Neuromuscular re-education, Balance training, Gait training, Patient/Family education, Self Care, Joint mobilization, Stair training, Vestibular training, Canalith repositioning, Orthotic/Fit training, DME instructions, Aquatic Therapy, Dry Needling, Electrical stimulation, Cryotherapy, Moist heat, Taping, Manual therapy, and Re-evaluation  PLAN FOR NEXT SESSION:   Aerobic capacity training .Aquatic HEP   Denton Meek, PTMPT 11/27/22 6:16 PM  Referring diagnosis? M21.371 (ICD-10-CM) - Foot drop, right foot M43.17 (ICD-10-CM) - Spondylolisthesis at L5-S1 level M48.062 (ICD-10-CM) - Spinal stenosis, lumbar region, with neurogenic claudication Treatment diagnosis? (if different than referring diagnosis)  Unsteadiness on feet Other abnormalities of gait and mobility Muscle weakness (generalized)  What was this (referring dx) caused by? []  Surgery []  Fall [x]  Ongoing issue []  Arthritis []  Other: ____________  Laterality: []  Rt []  Lt [x]  Both  Check all possible CPT codes:  *CHOOSE 10 OR LESS*    [x]  97110 (Therapeutic Exercise)  []  92507 (SLP Treatment)  [x]  97112 (Neuro Re-ed)   []  16109 (Swallowing Treatment)   [x]  [id number] (Gait Training)   []  [id number] (Cognitive Training, 1st 15 minutes) []  97140 (Manual Therapy)   []  97130 (Cognitive Training, each add'l 15 minutes)  [x]  97164 (Re-evaluation)                              []  Other, List CPT Code ____________  [x]  97530 (Therapeutic Activities)     [x]  97535 (Self Care)   []  All codes above (97110 - 97535)  []  97012  (Mechanical Traction)  []  97014 (E-stim Unattended)  []  97032 (E-stim manual)  [x]  97033 (Ionto)  []  97035 (Ultrasound) [x]  97750 (Physical Performance Training) [x]  [id number] (Aquatic Therapy) []  97016 (Vasopneumatic Device) []  [id number] (Paraffin) []  97034 (Contrast Bath) []  97597 (Wound Care 1st 20 sq cm) []  97598 (Wound Care each add'l 20 sq cm) []  97760 (Orthotic Fabrication, Fitting, Training Initial) []  [id number] (Prosthetic Management and Training Initial) []  [id number] (Orthotic or Prosthetic Training/ Modification Subsequent)

## 2022-11-27 ENCOUNTER — Encounter (HOSPITAL_BASED_OUTPATIENT_CLINIC_OR_DEPARTMENT_OTHER): Payer: Self-pay | Admitting: Physical Therapy

## 2022-11-27 ENCOUNTER — Ambulatory Visit (HOSPITAL_BASED_OUTPATIENT_CLINIC_OR_DEPARTMENT_OTHER): Payer: Medicare PPO | Admitting: Physical Therapy

## 2022-11-27 DIAGNOSIS — M6281 Muscle weakness (generalized): Secondary | ICD-10-CM | POA: Diagnosis not present

## 2022-11-27 DIAGNOSIS — R2681 Unsteadiness on feet: Secondary | ICD-10-CM | POA: Diagnosis not present

## 2022-11-27 DIAGNOSIS — R2689 Other abnormalities of gait and mobility: Secondary | ICD-10-CM

## 2022-12-03 ENCOUNTER — Ambulatory Visit (HOSPITAL_BASED_OUTPATIENT_CLINIC_OR_DEPARTMENT_OTHER): Payer: Self-pay | Admitting: Physical Therapy

## 2022-12-04 ENCOUNTER — Other Ambulatory Visit: Payer: Self-pay | Admitting: Family Medicine

## 2022-12-04 DIAGNOSIS — R52 Pain, unspecified: Secondary | ICD-10-CM

## 2022-12-18 ENCOUNTER — Ambulatory Visit (INDEPENDENT_AMBULATORY_CARE_PROVIDER_SITE_OTHER): Payer: Medicare PPO

## 2022-12-18 VITALS — HR 77 | Ht 60.0 in | Wt 104.4 lb

## 2022-12-18 DIAGNOSIS — M810 Age-related osteoporosis without current pathological fracture: Secondary | ICD-10-CM

## 2022-12-18 MED ORDER — DENOSUMAB 60 MG/ML ~~LOC~~ SOSY
60.0000 mg | PREFILLED_SYRINGE | Freq: Once | SUBCUTANEOUS | Status: AC
  Administered 2022-12-18: 60 mg via SUBCUTANEOUS

## 2022-12-18 NOTE — Progress Notes (Signed)
Patient verbally confirmed name, date of birth, and correct medication to be administered. Prolia injection administered and pt tolerated well.  

## 2022-12-19 ENCOUNTER — Other Ambulatory Visit: Payer: Self-pay | Admitting: Neurology

## 2022-12-19 DIAGNOSIS — G40009 Localization-related (focal) (partial) idiopathic epilepsy and epileptic syndromes with seizures of localized onset, not intractable, without status epilepticus: Secondary | ICD-10-CM

## 2022-12-26 ENCOUNTER — Encounter: Payer: Self-pay | Admitting: Neurology

## 2022-12-30 ENCOUNTER — Encounter: Payer: Self-pay | Admitting: Neurology

## 2022-12-31 ENCOUNTER — Ambulatory Visit: Payer: Medicare PPO | Admitting: Neurology

## 2023-01-01 DIAGNOSIS — M21371 Foot drop, right foot: Secondary | ICD-10-CM | POA: Diagnosis not present

## 2023-01-01 NOTE — Progress Notes (Unsigned)
[facility]   Subjective:     Chief Complaint: No chief complaint on file.  History of Present Illness: Kaitlyn Flowers is a 82 y.o. female with stage II pelvic organ prolapse who presents for a pessary check. She is using a size 2 1/4in incontinence ring with support pessary. The pessary has been working well and she has no complaints. She is using vaginal estrogen. She denies vaginal bleeding.  Past Medical History: Patient  has a past medical history of Anemia, Arthritis, Back pain, Cancer (HCC), Depression, Leg weakness, Osteopenia, PONV (postoperative nausea and vomiting), Right leg pain, Seizures (HCC), Spinal cord tumor, Urinary tract infection, and Wears hearing aid.   Past Surgical History: She  has a past surgical history that includes Abdominal hysterectomy (1973); shoulder rotator culff  (Left, 2006); spinal cord tumor (1987); Appendectomy; Rotator cuff repair; Lumbar wound debridement (12/06/2011); Colonoscopy; Anterior lumbar fusion (N/A, 05/17/2019); and Abdominal exposure (N/A, 05/17/2019).   Medications: She has a current medication list which includes the following prescription(s): acetylcysteine, vitamin d3, cyanocobalamin, gabapentin, lamotrigine, magnesium, NONFORMULARY OR COMPOUNDED ITEM, pyridoxine, and tramadol.   Allergies: Patient is allergic to ibuprofen-famotidine, fentanyl, pregabalin, and penicillins.   Social History: Patient  reports that she quit smoking about 55 years ago. Her smoking use included cigarettes. She has a 0.50 pack-year smoking history. She has never used smokeless tobacco. She reports current alcohol use of about 4.0 standard drinks of alcohol per week. She reports that she does not use drugs.      Objective:    Physical Exam: LMP  (LMP Unknown)  Gen: No apparent distress, A&O x 3. Detailed Urogynecologic Evaluation:  Pelvic Exam: Normal external female genitalia; Bartholin's and Skene's glands normal in appearance;  urethral meatus {urethra:24773}, no urethral masses or discharge. The pessary was noted to be {in place:24774}. It was removed and cleaned. Speculum exam revealed {vaginal lesions:24775} in the vagina. The pessary was replaced. It was comfortable to the patient and fit well.       No data to display          Laboratory Results: Urine dipstick shows: {ua dip:315374::"negative for all components"}.    Assessment/Plan:    Assessment: Kaitlyn Flowers is a 82 y.o. with stage II pelvic organ prolapse here for a pessary check. She is doing well.  Plan: She will {pessary plan:24776}. She will continue to use {lubricant:24777}. She will follow-up in *** {days/wks/mos/yrs:310907} for a pessary check or sooner as needed.  All questions were answered.   Time Spent:

## 2023-01-02 ENCOUNTER — Encounter: Payer: Self-pay | Admitting: Obstetrics and Gynecology

## 2023-01-02 ENCOUNTER — Ambulatory Visit: Payer: Medicare PPO | Admitting: Obstetrics and Gynecology

## 2023-01-02 DIAGNOSIS — Z4689 Encounter for fitting and adjustment of other specified devices: Secondary | ICD-10-CM

## 2023-01-02 MED ORDER — NONFORMULARY OR COMPOUNDED ITEM: 3 refills | Status: DC

## 2023-01-02 NOTE — Patient Instructions (Signed)
Will send the fax for the compounded vitamin E cream to the pharmacy.

## 2023-01-03 ENCOUNTER — Other Ambulatory Visit: Payer: Self-pay | Admitting: Family

## 2023-01-03 DIAGNOSIS — R52 Pain, unspecified: Secondary | ICD-10-CM

## 2023-01-07 NOTE — Telephone Encounter (Signed)
Last Prolia inj 12/18/22 Next Prolia inj due 06/20/23 

## 2023-01-09 NOTE — Telephone Encounter (Signed)
Attempted to reach pt. Left a detail message that prescription requests are sent. To call back if have any question.

## 2023-01-09 NOTE — Telephone Encounter (Signed)
Patient is leaving town in 2 hours

## 2023-01-17 ENCOUNTER — Other Ambulatory Visit: Payer: Self-pay | Admitting: Neurology

## 2023-01-17 DIAGNOSIS — G40009 Localization-related (focal) (partial) idiopathic epilepsy and epileptic syndromes with seizures of localized onset, not intractable, without status epilepticus: Secondary | ICD-10-CM

## 2023-01-20 ENCOUNTER — Ambulatory Visit: Payer: Medicare PPO | Admitting: Neurology

## 2023-01-23 ENCOUNTER — Ambulatory Visit: Payer: Medicare PPO | Admitting: Sports Medicine

## 2023-01-24 DIAGNOSIS — M965 Postradiation scoliosis: Secondary | ICD-10-CM | POA: Diagnosis not present

## 2023-01-24 DIAGNOSIS — M4180 Other forms of scoliosis, site unspecified: Secondary | ICD-10-CM | POA: Diagnosis not present

## 2023-01-30 ENCOUNTER — Ambulatory Visit: Payer: Medicare PPO | Admitting: Sports Medicine

## 2023-01-30 VITALS — BP 136/70 | Ht 60.0 in

## 2023-01-30 DIAGNOSIS — M21371 Foot drop, right foot: Secondary | ICD-10-CM

## 2023-01-30 NOTE — Assessment & Plan Note (Signed)
Her foot drop is significant and prior bracing has not helped her with her gait In this new anterior panel AFO of light weight carbon she demonstrates a much more stable gait and this corrects the foot drop.  She should continue to use this for any significant walking. Re-evaluate to see if she continues to improve her gait in ~ 3 months.

## 2023-01-30 NOTE — Progress Notes (Signed)
  Kaitlyn Flowers - 82 y.o. female MRN [id number]  Date of birth: 09/15/1940    CHIEF COMPLAINT:   brace check    SUBJECTIVE:   HPI:  Pleasant 82 year old female here for a brace check.  She has had right foot drop of the right lower extremity for a number of years.  She has had multiple falls due to this.  She tried several other braces - they were heavy/ posterior support as an AFO  before this but has not found one that has really helped her.  She had difficulty walking in them.  She recently got a new AFO but has not worn it much.  She wanted to have [place] inspect it before she committed to buying it.  I had sent her for evaluation of a light weight anterior panel carbon brace to help her with push-off for walking gait.  I also gave her an insole with a reverse lift added to the forefoot and that helps her keep from stumbling by elevating great toe and forefoot.  ROS:     See HPI  PERTINENT  PMH / PSH FH / / SH:  Past Medical, Surgical, Social, and Family History Reviewed & Updated in the EMR.  Pertinent findings include:  none  OBJECTIVE: LMP  (LMP Unknown)   Physical Exam:  Vital signs are reviewed. BP 136/70   Ht 5' (1.524 m)   LMP  (LMP Unknown)   BMI 20.39 kg/m   GEN: Alert and oriented, NAD Pulm: Breathing unlabored PSY: normal mood, congruent affect  MSK: Gait - analyzed with AFO on and multiple different shoes.  Her hips are almost neutrally aligned.  She does have dextroscoliosis of the thoracic spine.  Left lower extremity has about 10 degrees of external rotation, right lower extremity has about 20 degrees of external rotation.  She has more hip flexion on the right as a compensatory mechanism than the left.  She has good toe off with the AFO on the right lower extremity.  She says the brace feels comfortable and it does not rub on the anterior part of the leg.  Gait is much more functional in this as compared to no brace or her older brace.   ASSESSMENT &  PLAN:  1. Right Foot Drop  -I think this AFO brace is a good fit for her.  It is light weight and is not irritating to the leg.  She feels more lift in the foot with wearing it.  I did add some moleskin to the backside of the brace to prevent rubbing irritation.  Will have her focus on reducing some of her right hip flexion and focus on more internal rotation of the right lower extremity while walking to have her biomechanics more neutral.  She can walk as far as she can tolerate.  She can follow-up with [place] as needed   Arvella Nigh, MD PGY-4, Sports Medicine Fellow [facility]  I observed and examined the patient with the [facility] resident and agree with assessment and plan.  Note reviewed and modified by me. Sterling Big, MD

## 2023-02-21 ENCOUNTER — Encounter: Payer: Self-pay | Admitting: Neurology

## 2023-02-21 ENCOUNTER — Ambulatory Visit: Payer: Medicare PPO | Admitting: Neurology

## 2023-02-21 VITALS — BP 106/57 | HR 87 | Ht 60.0 in | Wt 102.4 lb

## 2023-02-21 DIAGNOSIS — G40009 Localization-related (focal) (partial) idiopathic epilepsy and epileptic syndromes with seizures of localized onset, not intractable, without status epilepticus: Secondary | ICD-10-CM

## 2023-02-21 DIAGNOSIS — M5417 Radiculopathy, lumbosacral region: Secondary | ICD-10-CM

## 2023-02-21 MED ORDER — LAMOTRIGINE 25 MG PO TABS: ORAL_TABLET | ORAL | 3 refills | Status: DC

## 2023-02-21 NOTE — Progress Notes (Signed)
To  NEUROLOGY FOLLOW UP OFFICE NOTE  Kaitlyn Flowers [id number] 21-Mar-1941  HISTORY OF PRESENT ILLNESS: I had the pleasure of seeing Kaitlyn Flowers in follow-up in the neurology clinic on 02/21/2023.  The patient was last seen 5 months ago for seizures. She is on low dose Lamotrigine 25mg  at bedtime with no seizures or seizure-like symptoms since 2017. On her last visit, main concern was a burning sensation on the left hip region, with some spread to the distribution of the lateral femoral cutaneous nerve (meralgia paresthetica). Hip xray was ordered but not done. She had an EMG/NCV of the left lower extremity in 10/2022 which showed chronic radiculopathies affecting the left L3, L4, and S1 nerve root/segments, mild. No neuropathy seen. She was advised to continue aquatherapy which she completed in March but continued to report a lot of pain. She notes that pain significantly flared up for 2 weeks after her EMG, they finally eased up. She is having bilateral hip pain. She was restarted on low dose Gabapantin 100mg  at bedtime but it made her feel spacey so she stopped it. She has seen Pain Management in the past and continues to follow closely with Dr. Danielle Dess. She has been referred to another specialist in [place] to discuss options for her lower back. Due to her right foot drop, she has had a couple of falls. Last month she tripped and hit her head on the shoe rack, then 10-14 days later, she started having odd sensations in her head. She would feel a short little spark, other times a feeling of pressure all over her head. She increased the Lamotrigine 25mg  to 2 tablets every night and reports the head symptoms got better. They happen once in a while but very rare. No side effects on this dose. She continues with pool exercises. It helps with leg strength but not with pain. She is easing into the new brace for right foot drop.   History On Initial Assessment 05/29/2016: This is a very pleasant 82 yo RH  woman who presented after a syncopal episode on 81/2/17 then a transient episode of aphasia on 03/25/16. She lives alone and was sitting outside last 03/03/16. She recalls turning around to get water, then blacked out. She woke up on the ground and found her glasses broken, she hit the right side of her face. No tongue bite/incontinence. She denied any prodromal symptoms, no dizziness or headaches. She had been drinking a lot of water due to recurrent UTIs since January 2017. She went to the ER, it was felt fall was likely a result of orthostatic hypotension from sitting out in the heat. No bloodwork on record from that visit. Then on 03/25/16 she was reading something off her phone and could not make sense of it. She showed the phone to her friend who said it looked fine. Then she found she could not "get/grab my words." Her friend drove her to the ER, she felt her friend was driving the wrong way and she was thinking this, but could not say it. This lasted 20-30 minutes, no associated focal numbness/tingling/weakness, headaches, or dizziness. Her BP on arrival was 139/72. I personally reviewed head CT without contrast which did not show any acute changes. She was found to have a sodium level of 123. She was also diagnosed with another UTI with >100,000 aerococcus urinae. She denies any further recurrence of symptoms. She denies any olfactory/gustatory hallucinations, deja vu, rising epigastric sensation, focal numbness/tingling/weakness, myoclonic jerks. She has chronic back pain and  self-catheterizes herself every night. She has chronic right leg numbness and weakness. For the past 1-1/2 years, she has had pain the right sacral area, worsened by sitting. She continues to work with PT.    She underwent a stroke workup, I personally reviewed images available. 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 MRA head did not show any intracranial  stenosis. Carotid dopplers did not show any evidence of ICA stenosis bilaterally. Her echocardiogram showed normal EF 60-65%, normal RV size and systolic function, no significant valvular abnormalities, left atrium normal size. Lipid panel showed an LDL of 114, total cholesterol 241. She started to take a daily baby aspirin.  She saw her PCP on 11/6 reporting random headaches and wondered if related to her BP. She was scheduled for an earlier appointment today, then yesterday had another transient episode of aphasia. She was working at [employer] for the election, looking at a card, then realized she could not see the words correctly. When she tried to speak, her words were jumbled. She states the aphasia lasted 30-40 minutes. A friend took her home then to her PCP office at noon, where bloodwork was done. She went to the ER at 11pm, reporting severe headache that started an hour prior. She had an intense pressure and throbbing over the left temporal region, 8/10, with nausea and vomiting. Headache lasted until this morning. No photo/phono/phonophobia. There was no aphasia noted in the ER, headache was treated with IV fluids and APAP. She denied any focal weakness or paresthesias. She denied having a headache with the previous episode of aphasia. She has no prior history of migraines. Over the past 2 weeks, she has been having random headaches localized on different parts of her head, occurring every 3-4 days, resolving after Tylenol intake.   Her mother had a stroke. Otherwise she had a normal birth and early development.  There is no history of febrile convulsions, CNS infections such as meningitis/encephalitis, significant traumatic brain injury, neurosurgical procedures, or family history of seizures.  Diagnostic Data: EEG in 07/2016: This awake and asleep EEG is abnormal due to the presence of: 1. Independent focal slowing seen over the bilateral temporal regions, left greater than right 2. Rare  epileptiform discharges over the left frontotemporal region   EEG in 09/2018:  This 1-hour awake and asleep EEG is abnormal due to the presence of: 1. Focal slowing over the bilateral temporal regions, left greater than right 2. 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   Urinary tract infection    Wears hearing aid     MEDICATIONS: Current Outpatient Medications on File Prior to Visit  Medication Sig Dispense Refill   Acetylcysteine (N-ACETYL-L-CYSTEINE PO) Take 500 mg by mouth daily.     Cholecalciferol (VITAMIN D3) 125 MCG (5000 UT) TABS Take 1 tablet by mouth daily.  cyanocobalamin 1000 MCG tablet Take 1,000 mcg by mouth daily.     lamoTRIgine (LAMICTAL) 25 MG tablet TAKE 1 TABLET(25 MG) BY MOUTH EVERY EVENING 30 tablet 0   MAGNESIUM PO Take 1 tablet by mouth daily.     NONFORMULARY OR COMPOUNDED ITEM Vitamin E vaginal cream 200u/ml.  One ml pv twice weekly.  Disp. 3 month supply. 24 each 3   pyridoxine (B-6) 200 MG tablet Take 200 mg by mouth daily.     traMADol (ULTRAM) 50 MG tablet TAKE 1 TABLET(50 MG) BY MOUTH TWICE DAILY AS NEEDED 60 tablet 0   No current facility-administered medications on file prior to visit.    ALLERGIES: Allergies  Allergen Reactions   Ibuprofen-Famotidine Other (See Comments)    Elevated BP   Fentanyl Other (See Comments)    Constipates for about 2 weeks   Pregabalin Swelling    AMS, Swelling   Penicillins Rash    Did it involve swelling of the  face/tongue/throat, SOB, or low BP? No Did it involve sudden or severe rash/hives, skin peeling, or any reaction on the inside of your mouth or nose? No Did you need to seek medical attention at a hospital or doctor's office? No When did it last happen?      30+ years If all above answers are "NO", may proceed with cephalosporin use.     FAMILY HISTORY: Family History  Problem Relation Age of Onset   Stroke Mother        2010   Heart attack Father    Cancer Paternal Grandmother     SOCIAL HISTORY: Social History   Socioeconomic History   Marital status: Widowed    Spouse name: Not on file   Number of children: Not on file   Years of education: Not on file   Highest education level: Not on file  Occupational History   Not on file  Tobacco Use   Smoking status: Former    Packs/day: 0.25    Years: 2.00    Additional pack years: 0.00    Total pack years: 0.50    Types: Cigarettes    Quit date: 09/03/1967    Years since quitting: 55.5   Smokeless tobacco: Never   Tobacco comments:    quit 48 years   Vaping Use   Vaping Use: Never used  Substance and Sexual Activity   Alcohol use: Yes    Alcohol/week: 4.0 standard drinks of alcohol    Types: 4 Glasses of wine per week    Comment: 4 a week   Drug use: No   Sexual activity: Not Currently    Partners: Male    Birth control/protection: Surgical    Comment: hysterectomy  Other Topics Concern   Not on file  Social History Narrative   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   Born in [place] calls [place] home in [place] since [place]   Right handed   One story home   Lives alone   Social Determinants of Health   Financial Resource Strain: Low Risk  (04/29/2022)   Overall Financial Resource Strain (CARDIA)    Difficulty of Paying Living Expenses: Not hard at all  Food Insecurity: No Food Insecurity (04/29/2022)   Hunger Vital Sign    Worried About Running Out of Food in the Last Year: Never true    Ran Out  of Food in the Last Year: Never true  Transportation Needs: No Transportation Needs (04/29/2022)   PRAPARE - Transportation  Lack of Transportation (Medical): No    Lack of Transportation (Non-Medical): No  Physical Activity: Sufficiently Active (04/29/2022)   Exercise Vital Sign    Days of Exercise per Week: 7 days    Minutes of Exercise per Session: 30 min  Stress: No Stress Concern Present (04/29/2022)   [employer] of Occupational Health - Occupational Stress Questionnaire    Feeling of Stress : Not at all  Social Connections: Moderately Integrated (08/14/2020)   Social Connection and Isolation Panel [NHANES]    Frequency of Communication with Friends and Family: More than three times a week    Frequency of Social Gatherings with Friends and Family: More than three times a week    Attends Religious Services: More than 4 times per year    Active Member of [employer] or Organizations: Yes    Attends Banker Meetings: More than 4 times per year    Marital Status: Widowed  Intimate Partner Violence: Not At Risk (08/14/2020)   Humiliation, Afraid, Rape, and Kick questionnaire    Fear of Current or Ex-Partner: No    Emotionally Abused: No    Physically Abused: No    Sexually Abused: No     PHYSICAL EXAM: Vitals:   02/21/23 1005  BP: (!) 106/57  Pulse: 87  SpO2: 96%   General: No acute distress Head:  Normocephalic/atraumatic Skin/Extremities: No rash, no edema Neurological Exam: alert and awake. No aphasia or dysarthria. Fund of knowledge is appropriate.  Attention and concentration are normal.   Cranial nerves: Pupils equal, round. Extraocular movements intact with no nystagmus. Visual fields full.  No facial asymmetry.  Motor: Bulk and tone normal, muscle strength 5/5 on both UE, left LE, 4-/5 right hip flexion, 3/5 foot dorsiflexion. Finger to nose testing intact.  Steppage gait due to right foot drop. No tremor.   IMPRESSION: This is a pleasant 82 yo RH woman  who had a syncopal episode on 81/2/17, then a transient episode of expressive aphasia last 03/25/16. Stroke workup was unremarkable, MRI brain did not show any acute changes, no significant stenosis seen intracranially or with carotid dopplers. Echo normal. She had another transient episode of aphasia on 07/21/16, this time followed by intense headache. No headache with previous event. Her EEG was abnormal with bilateral temporal slowing and rare left frontotemporal epileptiform discharges. 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 Repeat EEG in 09/2018 showed similar findings. No further episodes since 2017 on low dose Lamotrigine. She increased dose to 50mg  at bedtime after a head injury last month followed by head sensations with improvement in these symptoms. Continue current dose, we discussed symptomatic treatment, she can monitor symptoms and reduce back to 25mg  at bedtime as tolerated. Proceed with Neurosurgery evaluation. Continue aquatherapy. She is aware of  driving laws to stop driving after a seizure until 6 months seizure-free. Follow-up in 6 months, call for any changes.    Thank you for allowing me to participate in her care.  Please do not hesitate to call for any questions or concerns.    Patrcia Dolly, M.D.   CC: Dr. Fabian Sharp

## 2023-02-21 NOTE — Patient Instructions (Signed)
Always good to see you.  Continue on Lamotrigine 25mg : take 2 tablets every night. You can try reducing back to 1 tablet every night and monitor head symptoms. If they recur, ok to go back up to 2 tablets every night  2. Proceed with evaluation with Neurosurgery in [place], wishing you all the best!  3. Follow-up in 6 months, call for any changes   Seizure Precautions: 1. If medication has been prescribed for you to prevent seizures, take it exactly as directed.  Do not stop taking the medicine without talking to your doctor first, even if you have not had a seizure in a long time.   2. Avoid activities in which a seizure would cause danger to yourself or to others.  Don't operate dangerous machinery, swim alone, or climb in high or dangerous places, such as on ladders, roofs, or girders.  Do not drive unless your doctor says you may.  3. If you have any warning that you may have a seizure, lay down in a safe place where you can't hurt yourself.    4.  No driving for 6 months from last seizure, as per [facility].   Please refer to the following link on the Epile[employer] Foundation of America's website for more information: http://www.epilepsyfoundation.org/answerplace/Social/driving/drivingu.cfm   5.  Maintain good sleep hygiene.  6.  Contact your doctor if you have any problems that may be related to the medicine you are taking.  7.  Call 911 and bring the patient back to the ED if:        A.  The seizure lasts longer than 5 minutes.       B.  The patient doesn't awaken shortly after the seizure  C.  The patient has new problems such as difficulty seeing, speaking or moving  D.  The patient was injured during the seizure  E.  The patient has a temperature over 102 F (39C)  F.  The patient vomited and now is having trouble breathing

## 2023-03-11 NOTE — Progress Notes (Deleted)
No chief complaint on file.   HPI: Kaitlyn Flowers 82 y.o. come in for  ROS: See pertinent positives and negatives per HPI.  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   Urinary tract infection    Wears hearing aid     Family History  Problem Relation Age of Onset   Stroke Mother        2010   Heart attack Father    Cancer Paternal Grandmother     Social History   Socioeconomic History   Marital status: Widowed    Spouse name: Not on file   Number of children: Not on file   Years of education: Not on file   Highest education level: Not on file  Occupational History   Not on file  Tobacco Use   Smoking status: Former    Packs/day: 0.25    Years: 2.00    Additional pack years: 0.00    Total pack years: 0.50    Types: Cigarettes    Quit date: 09/03/1967    Years since quitting: 55.5   Smokeless tobacco: Never   Tobacco comments:    quit 48 years   Vaping Use   Vaping Use: Never used  Substance and Sexual Activity   Alcohol use: Yes    Alcohol/week: 4.0 standard drinks of alcohol    Types: 4 Glasses of wine per week    Comment: 4 a week   Drug use: No   Sexual activity: Not Currently    Partners: Male    Birth control/protection: Surgical    Comment: hysterectomy  Other Topics Concern   Not on file  Social History Narrative   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   Born in [place] calls [place] home in [place] since [place]   Right handed   One story home   Lives alone   Social Determinants of Health   Financial Resource Strain: Low Risk  (04/29/2022)    Overall Financial Resource Strain (CARDIA)    Difficulty of Paying Living Expenses: Not hard at all  Food Insecurity: No Food Insecurity (04/29/2022)   Hunger Vital Sign    Worried About Running Out of Food in the Last Year: Never true    Ran Out of Food in the Last Year: Never true  Transportation Needs: No Transportation Needs (04/29/2022)   PRAPARE - Administrator, Civil Service (Medical): No    Lack of Transportation (Non-Medical): No  Physical Activity: Sufficiently Active (04/29/2022)   Exercise Vital Sign    Days of Exercise per Week: 7 days    Minutes of Exercise per Session: 30 min  Stress: No Stress Concern Present (04/29/2022)   [employer] of Occupational Health - Occupational Stress Questionnaire    Feeling of Stress : Not at all  Social Connections: Moderately Integrated (08/14/2020)   Social Connection and Isolation Panel [NHANES]    Frequency of Communication with Friends and Family: More than three times a week  Frequency of Social Gatherings with Friends and Family: More than three times a week    Attends Religious Services: More than 4 times per year    Active Member of Clubs or Organizations: Yes    Attends Banker Meetings: More than 4 times per year    Marital Status: Widowed    Outpatient Medications Prior to Visit  Medication Sig Dispense Refill   Acetylcysteine (N-ACETYL-L-CYSTEINE PO) Take 500 mg by mouth daily.     Cholecalciferol (VITAMIN D3) 125 MCG (5000 UT) TABS Take 1 tablet by mouth daily.     cyanocobalamin 1000 MCG tablet Take 1,000 mcg by mouth daily.     lamoTRIgine (LAMICTAL) 25 MG tablet Take 2 tablets every evening 180 tablet 3   MAGNESIUM PO Take 1 tablet by mouth daily.     NONFORMULARY OR COMPOUNDED ITEM Vitamin E vaginal cream 200u/ml.  One ml pv twice weekly.  Disp. 3 month supply. 24 each 3   pyridoxine (B-6) 200 MG tablet Take 200 mg by mouth daily.     traMADol (ULTRAM) 50 MG tablet TAKE 1 TABLET(50  MG) BY MOUTH TWICE DAILY AS NEEDED 60 tablet 0   No facility-administered medications prior to visit.     EXAM:  LMP  (LMP Unknown)   There is no height or weight on file to calculate BMI.  GENERAL: vitals reviewed and listed above, alert, oriented, appears well hydrated and in no acute distress HEENT: atraumatic, conjunctiva  clear, no obvious abnormalities on inspection of external nose and ears OP : no lesion edema or exudate  NECK: no obvious masses on inspection palpation  LUNGS: clear to auscultation bilaterally, no wheezes, rales or rhonchi, good air movement CV: HRRR, no clubbing cyanosis or  peripheral edema nl cap refill  MS: moves all extremities without noticeable focal  abnormality PSYCH: pleasant and cooperative, no obvious depression or anxiety Lab Results  Component Value Date   WBC 9.0 09/13/2022   HGB 11.0 (L) 09/13/2022   HCT 32.5 (L) 09/13/2022   PLT 237 09/13/2022   GLUCOSE 95 09/13/2022   CHOL 224 (H) 03/22/2022   TRIG 60.0 03/22/2022   HDL 88.70 03/22/2022   LDLDIRECT 110.1 11/27/2010   LDLCALC 124 (H) 03/22/2022   ALT 16 09/13/2022   AST 23 09/13/2022   NA 133 (L) 09/13/2022   K 4.1 09/13/2022   CL 97 (L) 09/13/2022   CREATININE 1.03 (H) 09/13/2022   BUN 31 (H) 09/13/2022   CO2 26 09/13/2022   TSH 1.42 03/22/2022   INR 1.01 03/25/2016   HGBA1C 5.7 03/22/2022   BP Readings from Last 3 Encounters:  02/21/23 (!) 106/57  01/30/23 136/70  01/02/23 124/69    ASSESSMENT AND PLAN:  Discussed the following assessment and plan:  No diagnosis found.  -Patient advised to return or notify health care team  if  new concerns arise.  There are no Patient Instructions on file for this visit.   Neta Mends. Etana Beets M.D.

## 2023-03-12 ENCOUNTER — Ambulatory Visit: Payer: Medicare PPO | Admitting: Internal Medicine

## 2023-03-13 ENCOUNTER — Ambulatory Visit: Payer: Medicare PPO | Admitting: Neurology

## 2023-03-20 ENCOUNTER — Encounter: Payer: Self-pay | Admitting: Internal Medicine

## 2023-03-20 ENCOUNTER — Ambulatory Visit: Payer: Medicare PPO | Admitting: Internal Medicine

## 2023-03-20 VITALS — BP 118/60 | HR 83 | Temp 97.9°F | Ht 60.0 in | Wt 102.0 lb

## 2023-03-20 DIAGNOSIS — R0683 Snoring: Secondary | ICD-10-CM | POA: Diagnosis not present

## 2023-03-20 DIAGNOSIS — G40909 Epilepsy, unspecified, not intractable, without status epilepticus: Secondary | ICD-10-CM

## 2023-03-20 DIAGNOSIS — R5383 Other fatigue: Secondary | ICD-10-CM

## 2023-03-20 NOTE — Patient Instructions (Signed)
Good to see you today. Will plan  on referral to assess for sleep apnea. or associated sleep . Abnormality sleep disordered breathing.

## 2023-03-20 NOTE — Progress Notes (Unsigned)
Chief Complaint  Patient presents with   Sleeping Problem    Pt reports her son told her about a month ago that she snore at night, can't catch her breathe while she sleep. Feeling tired.     HPI: Kaitlyn Flowers 82 y.o. come in for  noted problem during sleep   See  note from neurology team  unddrlying seizure  on low dose mamictal  , on going   Son was staying  at her home hear sounds like snoring and then  trying to get breath. Very loud      She feels she is sleeping  through night   but does get tired now  and more recently   a lot lays down a lot  in pm.  Over past 4-5 mos 4-5 months    Sleeps on back  no known parasomnia  Neg fam hx osa  no swalling new resp issues .  ROS: See pertinent positives and negatives per HPI.  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   Urinary tract infection    Wears hearing aid     Family History  Problem Relation Age of Onset   Stroke Mother        2010   Heart attack Father    Cancer Paternal Grandmother     Social History   Socioeconomic History   Marital status: Widowed    Spouse name: Not on file   Number of children: Not on file   Years of education: Not on file   Highest education level: Doctorate  Occupational History   Not on file  Tobacco Use   Smoking status: Former    Current packs/day: 0.00    Average packs/day: 0.3 packs/day for 2.0 years (0.5 ttl pk-yrs)    Types: Cigarettes    Start date: 09/02/1965    Quit date: 09/03/1967    Years since quitting: 55.5   Smokeless tobacco: Never   Tobacco comments:    quit 48 years    Vaping Use   Vaping status: Never Used  Substance and Sexual Activity   Alcohol use: Yes    Alcohol/week: 4.0 standard drinks of alcohol    Types: 4 Glasses of wine per week    Comment: 4 a week   Drug use: No   Sexual activity: Not Currently    Partners: Male    Birth control/protection: Surgical    Comment: hysterectomy  Other Topics Concern   Not on file  Social History Narrative   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   Born in [place] calls [place] [employer] home in [place] since 1990   Right handed   One story home   Lives alone   Social Determinants of Health   Financial Resource Strain: Low Risk  (03/12/2023)   Overall Financial Resource Strain (CARDIA)    Difficulty of Paying Living Expenses: Not hard at all  Food Insecurity: No Food Insecurity (03/12/2023)   Hunger Vital Sign  Worried About Programme researcher, broadcasting/film/video in the Last Year: Never true    Ran Out of Food in the Last Year: Never true  Transportation Needs: No Transportation Needs (03/12/2023)   PRAPARE - Administrator, Civil Service (Medical): No    Lack of Transportation (Non-Medical): No  Physical Activity: Sufficiently Active (03/12/2023)   Exercise Vital Sign    Days of Exercise per Week: 6 days    Minutes of Exercise per Session: 30 min  Stress: Stress Concern Present (03/12/2023)   [employer] of Occupational Health - Occupational Stress Questionnaire    Feeling of Stress : To some extent  Social Connections: Moderately Integrated (03/12/2023)   Social Connection and Isolation Panel [NHANES]    Frequency of Communication with Friends and Family: More than three times a week    Frequency of Social Gatherings with Friends and Family: More than three times a week    Attends Religious Services: More than 4 times per year    Active Member of [employer] or Organizations: Yes    Attends Banker Meetings: More than 4 times per year    Marital Status: Widowed    Outpatient  Medications Prior to Visit  Medication Sig Dispense Refill   Cholecalciferol (VITAMIN D3) 125 MCG (5000 UT) TABS Take 1 tablet by mouth daily.     cyanocobalamin 1000 MCG tablet Take 1,000 mcg by mouth daily.     lamoTRIgine (LAMICTAL) 25 MG tablet Take 2 tablets every evening 180 tablet 3   MAGNESIUM PO Take 1 tablet by mouth daily.     NONFORMULARY OR COMPOUNDED ITEM Vitamin E vaginal cream 200u/ml.  One ml pv twice weekly.  Disp. 3 month supply. 24 each 3   Probiotic Product (PROBIOTIC DAILY PO) Take by mouth.     pyridoxine (B-6) 200 MG tablet Take 200 mg by mouth daily.     traMADol (ULTRAM) 50 MG tablet TAKE 1 TABLET(50 MG) BY MOUTH TWICE DAILY AS NEEDED 60 tablet 0   Acetylcysteine (N-ACETYL-L-CYSTEINE PO) Take 500 mg by mouth daily.     No facility-administered medications prior to visit.     EXAM:  BP 118/60 (BP Location: Left Arm, Patient Position: Sitting, Cuff Size: Normal)   Pulse 83   Temp 97.9 F (36.6 C) (Oral)   Ht 5' (1.524 m)   Wt 102 lb (46.3 kg)   LMP  (LMP Unknown)   SpO2 97%   BMI 19.92 kg/m   Body mass index is 19.92 kg/m.  GENERAL: vitals reviewed and listed above, alert, oriented, appears well hydrated and in no acute distress standing  non toxic  nl speech  HEENT: atraumatic, conjunctiva  clear, no obvious abnormalities on inspection of external nose and ears OP : no lesion edema or exudate  NECK: no obvious masses on inspection palpation  LUNGS: clear to auscultation bilaterally, no wheezes, rales or rhonchi, good air movement mild kyphosis  CV: HRRR, no clubbing cyanosis Ambulatory  PSYCH: pleasant and cooperative, no obvious depression or anxiety Lab Results  Component Value Date   WBC 9.0 09/13/2022   HGB 11.0 (L) 09/13/2022   HCT 32.5 (L) 09/13/2022   PLT 237 09/13/2022   GLUCOSE 95 09/13/2022   CHOL 224 (H) 03/22/2022   TRIG 60.0 03/22/2022   HDL 88.70 03/22/2022   LDLDIRECT 110.1 11/27/2010   LDLCALC 124 (H) 03/22/2022   ALT 16  09/13/2022   AST 23 09/13/2022   NA 133 (L) 09/13/2022   K  4.1 09/13/2022   CL 97 (L) 09/13/2022   CREATININE 1.03 (H) 09/13/2022   BUN 31 (H) 09/13/2022   CO2 26 09/13/2022   TSH 1.42 03/22/2022   INR 1.01 03/25/2016   HGBA1C 5.7 03/22/2022   BP Readings from Last 3 Encounters:  03/20/23 118/60  02/21/23 (!) 106/57  01/30/23 136/70    ASSESSMENT AND PLAN:  Discussed the following assessment and plan:  Loud snoring - sounds obstructive  Feeling tired - recnet taking naps and unrefreshed sleep  Seizure disorder (HCC) Concern about sleep disordered breathing possibly effecting her fatigue and health  On anti seizure medication -Patient advised to return or notify health care team  if  new concerns arise.  Patient Instructions  Good to see you today. Will plan  on referral to assess for sleep apnea. or associated sleep . Abnormality sleep disordered breathing.         Neta Mends. Laveda Demedeiros M.D.

## 2023-04-17 ENCOUNTER — Encounter (INDEPENDENT_AMBULATORY_CARE_PROVIDER_SITE_OTHER): Payer: Self-pay

## 2023-04-19 NOTE — Progress Notes (Unsigned)
04/21/23- 82 yoF for sleep evaluation courtesy of Dr Fabian Sharp with concerns of snoring, daytime tiredness, witnessed apnea when son visited. Medical problem list includes Autonomic Dysfunction, Epilepsy/ Lamictal, R leg weakness, Osteoporosis, Spondylolisthesis,  Epworth score 8 Body eight today- 103 lbs Son tells her of loud snoring and witnessed apneas. She is concerned about daytime tiredness in recent months.  Not aware of complex parasomnias. Hx ENT surgery- tonsils. Denies heart/ lung problems. No sleep meds. 1 cup coffee.  Bedtime 10:30-11:30, up x1, then up 8AM. She also is concerned about easier DOE in last 2 months w/o cough or wheeze, and wonders if this is from slowly progressive scoliosis. She blames scoliosis on extensive XRT at age 86 for spinal tumor. She noticed this DOE at first if she didn't exercise regularly, and it would resolve as she became more active. Now it seems to persist.  Prior to Admission medications   Medication Sig Start Date End Date Taking? Authorizing Provider  Cholecalciferol (VITAMIN D3) 125 MCG (5000 UT) TABS Take 1 tablet by mouth daily.   Yes [provider]  cyanocobalamin 1000 MCG tablet Take 1,000 mcg by mouth daily.   Yes [provider]  lamoTRIgine (LAMICTAL) 25 MG tablet Take 2 tablets every evening 02/21/23  Yes Van Clines, MD  MAGNESIUM PO Take 1 tablet by mouth daily.   Yes [provider]  NONFORMULARY OR COMPOUNDED ITEM Vitamin E vaginal cream 200u/ml.  One ml pv twice weekly.  Disp. 3 month supply. 01/02/23  Yes Selmer Dominion, NP  Probiotic Product (PROBIOTIC DAILY PO) Take by mouth.   Yes [provider]  pyridoxine (B-6) 200 MG tablet Take 200 mg by mouth daily.   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   Urinary tract infection    Wears hearing aid    Past Surgical History:  Procedure Laterality Date   ABDOMINAL EXPOSURE N/A 05/17/2019   Procedure: ABDOMINAL EXPOSURE;  Surgeon: Chuck Hint, MD;  Location: [facility] OR;  Service: Vascular;  Laterality: N/A;   ABDOMINAL HYSTERECTOMY  1973   1/2 of ovary left cyst on ovary   ANTERIOR LUMBAR FUSION N/A 05/17/2019   Procedure: Lumbar five Sacral one  Anterior lumbar interbody fusion;  Surgeon: Barnett Abu, MD;  Location: [facility] OR;  Service: Neurosurgery;  Laterality: N/A;   APPENDECTOMY     removed with hysterectomy   COLONOSCOPY     LUMBAR WOUND DEBRIDEMENT  12/06/2011   Procedure: LUMBAR WOUND DEBRIDEMENT;  Surgeon: Barnett Abu, MD;  Location: [facility];  Service: Neurosurgery;  Laterality: N/A;  Repair of Pseudomeningocele Lumbar wound   ROTATOR CUFF REPAIR     left    shoulder rotator culff  Left 2006   spinal cord tumor  1987   2012 october  Family History  Problem Relation Age of Onset   Stroke Mother        2010   Heart attack Father    Cancer Paternal Grandmother    Social History   Socioeconomic History   Marital status: Widowed    Spouse name: Not on file   Number of children: Not on file   Years of education: Not on file   Highest education level: Doctorate  Occupational History   Not on file  Tobacco Use   Smoking status: Former    Current packs/day: 0.00    Average packs/day: 0.3 packs/day for 2.0 years (0.5 ttl pk-yrs)    Types: Cigarettes    Start date: 09/02/1965    Quit date: 09/03/1967    Years since quitting: 55.6   Smokeless tobacco: Never   Tobacco  comments:    quit 48 years   Vaping Use   Vaping status: Never Used  Substance and Sexual Activity   Alcohol use: Yes    Alcohol/week: 4.0 standard drinks of alcohol    Types: 4 Glasses of wine per week    Comment: 4 a week   Drug use: No   Sexual activity: Not Currently    Partners: Male    Birth control/protection: Surgical    Comment: hysterectomy  Other Topics Concern   Not on file  Social History Narrative   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   Born in [place] calls [place] home in [place] since 1990   Right handed   One story home   Lives alone   Social Determinants of Health   Financial Resource Strain: Low Risk  (03/12/2023)   Overall Financial Resource Strain (CARDIA)    Difficulty of Paying Living Expenses: Not hard at all  Food Insecurity: No Food Insecurity (03/12/2023)   Hunger Vital Sign    Worried About Running Out of Food in the Last Year: Never true    Ran Out of Food in the Last Year: Never true  Transportation Needs: No Transportation Needs (03/12/2023)   PRAPARE - Administrator, Civil Service (Medical): No    Lack of Transportation (Non-Medical): No  Physical Activity: Sufficiently Active (03/12/2023)   Exercise Vital Sign    Days of Exercise per Week: 6 days    Minutes of Exercise per Session: 30 min  Stress: Stress Concern Present (03/12/2023)   [employer] of Occupational Health - Occupational Stress Questionnaire    Feeling of Stress : To some extent  Social Connections: Moderately Integrated (03/12/2023)   Social Connection and Isolation Panel [NHANES]    Frequency of Communication with Friends and Family: More than three times a week    Frequency of Social Gatherings with Friends and Family: More than three times a week    Attends Religious Services: More than 4 times per year    Active Member of [employer] or Organizations: Yes    Attends Banker Meetings: More than 4 times per year    Marital Status:  Widowed  Intimate Partner Violence: Not At Risk (08/14/2020)   Humiliation, Afraid, Rape, and Kick questionnaire    Fear of Current or Ex-Partner: No    Emotionally Abused: No    Physically Abused: No    Sexually Abused: No   ROS-see HPI   + = positive Constitutional:    weight loss, night sweats, fevers, chills, fatigue, lassitude. HEENT:    headaches, difficulty swallowing, tooth/dental problems, sore throat,  sneezing, itching, ear ache, nasal congestion, post nasal drip, snoring CV:    chest pain, orthopnea, PND, swelling in lower extremities, anasarca,                                  dizziness, palpitations Resp:   shortness of breath with exertion or at rest.                productive cough,   non-productive cough, coughing up of blood.              change in color of mucus.  wheezing.   Skin:    rash or lesions. GI:  No-   heartburn, indigestion, abdominal pain, nausea, vomiting, diarrhea,                 change in bowel habits, loss of appetite GU: dysuria, change in color of urine, no urgency or frequency.   flank pain. MS:   joint pain, stiffness, decreased range of motion, back pain. Neuro-     nothing unusual Psych:  change in mood or affect.  depression or anxiety.   memory loss.  OBJ- Physical Exam    +petite General- Alert, Oriented, Affect-appropriate, Distress- none acute Skin- rash-none, lesions- none, excoriation- none Lymphadenopathy- none Head- atraumatic            Eyes- Gross vision intact, PERRLA, conjunctivae and secretions clear            Ears- Hearing, canals-normal            Nose- Clear, no-Septal dev, mucus, polyps, erosion, perforation             Throat- Mallampati III-IV , mucosa clear , drainage- none, tonsils-absent, +teeth, Neck- flexible , trachea midline, no stridor , thyroid nl, carotid no bruit Chest - symmetrical excursion , unlabored           Heart/CV- RRR , no murmur , no gallop  , no rub, nl s1 s2                           - JVD-  none , edema- none, stasis changes- none, varices- none           Lung- clear to P&A, wheeze- none, cough- none , dullness-none, rub- none           Chest wall- +scoliosis- had to stand for comfort. Abd-  Br/ Gen/ Rectal- Not done, not indicated Extrem- cyanosis- none, clubbing, none, atrophy- none, strength- nl Neuro- grossly intact to observation

## 2023-04-21 ENCOUNTER — Encounter: Payer: Self-pay | Admitting: Internal Medicine

## 2023-04-21 ENCOUNTER — Ambulatory Visit: Payer: Medicare PPO | Admitting: Internal Medicine

## 2023-04-21 VITALS — BP 118/62 | HR 74 | Ht 60.0 in | Wt 103.4 lb

## 2023-04-21 DIAGNOSIS — R0683 Snoring: Secondary | ICD-10-CM | POA: Insufficient documentation

## 2023-04-21 DIAGNOSIS — G4733 Obstructive sleep apnea (adult) (pediatric): Secondary | ICD-10-CM | POA: Insufficient documentation

## 2023-04-21 DIAGNOSIS — R0609 Other forms of dyspnea: Secondary | ICD-10-CM | POA: Diagnosis not present

## 2023-04-21 NOTE — Patient Instructions (Signed)
Order- schedule PFT   dx dyspnea on exertion  Order- schedule home sleep test    dx snoring   Please call us for results and recommendations about 2 weeks after your sleep test

## 2023-04-21 NOTE — Assessment & Plan Note (Signed)
Some degree of OSA seems likely. Appropriate discussion. Plan- Sleep study.

## 2023-04-21 NOTE — Assessment & Plan Note (Signed)
She is concerned. Dyspnea used to improve if she resumed regular exercise, but in recent months it persists. No acute event. Plan- PFT

## 2023-04-22 ENCOUNTER — Ambulatory Visit: Payer: Medicare PPO | Admitting: Sports Medicine

## 2023-04-22 ENCOUNTER — Encounter: Payer: Self-pay | Admitting: Sports Medicine

## 2023-04-22 VITALS — BP 127/65 | Ht 60.0 in | Wt 103.0 lb

## 2023-04-22 DIAGNOSIS — R29898 Other symptoms and signs involving the musculoskeletal system: Secondary | ICD-10-CM | POA: Diagnosis not present

## 2023-04-22 NOTE — Assessment & Plan Note (Addendum)
-   AFO brace has been fitting well and providing structural support for foot drop.  She has developed compensatory weakness in the right quadriceps as well as hip abductor muscle weakness. This is causing an out-toeing and her gait and soreness of the quadriceps. She also has some decreased IR likely 2/2 OA.  - Scaphoid pad placed on AFO brace today.  This improved her out-toeing some and tolerance with walking. - We will start with a hip abductor and isometric flexion exercises for strengthening - Follow-up in 6 weeks for reassessment.

## 2023-04-22 NOTE — Progress Notes (Unsigned)
Kaitlyn Flowers - 82 y.o. female MRN [id number]  Date of birth: [birth year]  PCP: Madelin Headings, MD  Subjective:  No chief complaint on file.  R leg soreness  HPI: Past Medical, Surgical, Social, and Family History Reviewed & Updated per EMR.   Patient is a 82 y.o. female here for severity: Moderate, timing: constant, progressive, non radiating, soreness located in the R anterior thigh and R lateral calf that started after getting new shoes and wearing her AFO brace. It is exacerbated by walking and alleviated by rest. She has not had any falls, numbness, weakness, fevers, chills, or swelling of the hip, knee, or ankle. No discomfort around contact points with the brace.   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   Urinary tract infection    Wears hearing aid     Current Outpatient Medications on File Prior to Visit  Medication Sig Dispense Refill   Cholecalciferol (VITAMIN D3) 125 MCG (5000 UT) TABS Take 1 tablet by mouth daily.     cyanocobalamin 1000 MCG tablet Take 1,000 mcg by mouth daily.     lamoTRIgine (LAMICTAL) 25 MG tablet Take 2 tablets every evening 180 tablet 3   MAGNESIUM PO Take 1 tablet by mouth daily.     NONFORMULARY OR COMPOUNDED ITEM Vitamin E vaginal cream 200u/ml.  One ml pv twice weekly.  Disp. 3 month supply. 24 each 3   Probiotic Product (PROBIOTIC DAILY PO) Take by mouth.     pyridoxine (B-6) 200 MG tablet Take 200 mg by mouth daily.     traMADol (ULTRAM) 50 MG tablet TAKE 1 TABLET(50 MG) BY MOUTH TWICE DAILY AS NEEDED 60 tablet 0   No current facility-administered  medications on file prior to visit.    Past Surgical History:  Procedure Laterality Date   ABDOMINAL EXPOSURE N/A 05/17/2019   Procedure: ABDOMINAL EXPOSURE;  Surgeon: Chuck Hint, MD;  Location: [facility] OR;  Service: Vascular;  Laterality: N/A;   ABDOMINAL HYSTERECTOMY  1973   1/2 of ovary left cyst on ovary   ANTERIOR LUMBAR FUSION N/A 05/17/2019   Procedure: Lumbar five Sacral one  Anterior lumbar interbody fusion;  Surgeon: Barnett Abu, MD;  Location: [facility] OR;  Service: Neurosurgery;  Laterality: N/A;   APPENDECTOMY     removed with hysterectomy   COLONOSCOPY     LUMBAR WOUND DEBRIDEMENT  12/06/2011   Procedure: LUMBAR WOUND DEBRIDEMENT;  Surgeon: Barnett Abu, MD;  Location: [facility];  Service: Neurosurgery;  Laterality: N/A;  Repair of Pseudomeningocele Lumbar wound   ROTATOR CUFF REPAIR     left    shoulder rotator culff  Left 2006   spinal cord tumor  1987   2012 october     Allergies  Allergen Reactions   Ibuprofen-Famotidine Other (See Comments)    Elevated BP   Fentanyl Other (See Comments)    Constipates for about 2 weeks   Pregabalin Swelling    AMS, Swelling   Penicillins  Rash    Did it involve swelling of the face/tongue/throat, SOB, or low BP? No Did it involve sudden or severe rash/hives, skin peeling, or any reaction on the inside of your mouth or nose? No Did you need to seek medical attention at a hospital or doctor's office? No When did it last happen?      30+ years If all above answers are "NO", may proceed with cephalosporin use.         Objective:  Physical Exam: VS: BP:127/65  HR: bpm  TEMP: ( )  RESP:   HT:5' (152.4 cm)   WT:103 lb (46.7 kg)  BMI:20.12  Gen: NAD, speaks clearly, comfortable in exam room Respiratory: normal work of breathing on room air Skin: No rashes, abrasions, or ecchymosis MSK: Trendelenburg positive on the right.  Observation reveals atrophy of the right quadriceps muscles.  3/5 strength in hip flexion,  abduction, and knee extension on the right compared to 5/5 on the left. IR and ER of the R restricted to 30 degrees but full on the left. Mild tenderness to palpation over the lateral gastroc muscles and proximal quadriceps muscles.No tenderness, abrasions or rashes over the contact surfaces of the brace.  Gait reveals a mild right hip hike with 10 degrees of out-toeing and weightbearing favoring the right using a cane in the L hand. There is decreased R hip extension and push off.     Assessment & Plan:   Right leg weakness - AFO brace has been fitting well and providing structural support.  She has developed compensatory weakness in the right quadriceps as well as hip abductor muscle weakness.  This is causing an out-toeing and her gait. - Scaphoid pad placed on AFO brace today.  This improved her out-toeing some and tolerance with walking. - We will start with a hip abductor and metric flexion exercises for strengthening - Follow-up in 6 weeks for reassessment.    Rica Mote MD [facility] Sports Medicine Fellow  I observed and examined the patient with the [facility] resident and agree with assessment and plan.  Note reviewed and modified by me. Sterling Big, MD

## 2023-04-28 ENCOUNTER — Institutional Professional Consult (permissible substitution): Payer: Medicare PPO | Admitting: Adult Health

## 2023-05-01 DIAGNOSIS — G4730 Sleep apnea, unspecified: Secondary | ICD-10-CM | POA: Diagnosis not present

## 2023-05-08 ENCOUNTER — Ambulatory Visit: Payer: Medicare PPO | Admitting: Obstetrics and Gynecology

## 2023-05-12 ENCOUNTER — Ambulatory Visit: Payer: Medicare PPO | Admitting: Obstetrics and Gynecology

## 2023-05-12 ENCOUNTER — Telehealth: Payer: Self-pay | Admitting: Internal Medicine

## 2023-05-12 NOTE — Telephone Encounter (Signed)
Patient checking on results of home sleep study. Patient phone number is [phone].

## 2023-05-13 DIAGNOSIS — L570 Actinic keratosis: Secondary | ICD-10-CM | POA: Diagnosis not present

## 2023-05-13 DIAGNOSIS — H2513 Age-related nuclear cataract, bilateral: Secondary | ICD-10-CM | POA: Diagnosis not present

## 2023-05-13 DIAGNOSIS — L821 Other seborrheic keratosis: Secondary | ICD-10-CM | POA: Diagnosis not present

## 2023-05-13 DIAGNOSIS — L814 Other melanin hyperpigmentation: Secondary | ICD-10-CM | POA: Diagnosis not present

## 2023-05-13 DIAGNOSIS — L820 Inflamed seborrheic keratosis: Secondary | ICD-10-CM | POA: Diagnosis not present

## 2023-05-13 DIAGNOSIS — D1801 Hemangioma of skin and subcutaneous tissue: Secondary | ICD-10-CM | POA: Diagnosis not present

## 2023-05-13 DIAGNOSIS — Z85828 Personal history of other malignant neoplasm of skin: Secondary | ICD-10-CM | POA: Diagnosis not present

## 2023-05-14 NOTE — Telephone Encounter (Signed)
I don't see a result for home sleep test, and can't even tell that it has been done yet.

## 2023-05-14 NOTE — Telephone Encounter (Signed)
The results will be provided through [employer] of of right now it's being analyzed. Will reach out to [employer] for an update on results.

## 2023-05-15 ENCOUNTER — Telehealth (INDEPENDENT_AMBULATORY_CARE_PROVIDER_SITE_OTHER): Payer: Medicare PPO | Admitting: Family Medicine

## 2023-05-15 VITALS — BP 120/70 | Wt 103.0 lb

## 2023-05-15 DIAGNOSIS — Z0000 Encounter for general adult medical examination without abnormal findings: Secondary | ICD-10-CM | POA: Diagnosis not present

## 2023-05-15 NOTE — Progress Notes (Signed)
PATIENT CHECK-IN and HEALTH RISK ASSESSMENT QUESTIONNAIRE:  -completed by phone/video for upcoming Medicare Preventive Visit  Pre-Visit Check-in: 1)Vitals (height, wt, BP, etc) - record in vitals section for visit on day of visit Request home vitals (wt, BP, etc.) and enter into vitals, THEN update Vital Signs SmartPhrase below at the top of the HPI. See below.  2)Review and Update Medications, Allergies PMH, Surgeries, Social history in Epic 3)Hospitalizations in the last year with date/reason?  No   4)Review and Update Care Team (patient's specialists) in Epic 5) Complete PHQ9 in Epic  6) Complete Fall Screening in Epic 7)Review all Health Maintenance Due and order under PCP if not done.  8)Medicare Wellness Questionnaire: Answer theses question about your habits: Do you drink alcohol? Yes  If yes, how many drinks do you have a day?4-5 times per wk  Have you ever smoked? No  Do you use smokeless tobacco? No  Do you use an illicit drugs? No  Do you exercises?  Yes IF so, what type and how many days/minutes per week? 5 days a week, adaptive pilates 2 times per week, walks about 25-30 minutes 5 days per week, she stands most of the time Are you sexually active? No  Typical breakfast: granola yogurt and fruit  Typical lunch: varies - leftover or salad, or sandwich  Typical dinner:protein (fish  or bean) carb  Typical snacks: fruit or granola or slice of cheese   Beverages: water  Answer theses question about you: Can you perform most household chores?yes  Do you find it hard to follow a conversation in a noisy room? Not usual  Do you often ask people to speak up or repeat themselves? Once in a while  Do you feel that you have a problem with memory? Yes, somewhat Do you balance your checkbook and or bank acounts? Yes  Do you feel safe at home? Yes  Last dentist visit? month ago  Do you need assistance with any of the following: Please note if so   Driving?  Feeding  yourself?  Getting from bed to chair?  Getting to the toilet?  Bathing or showering?  Dressing yourself?  Managing money?  Climbing a flight of stairs -has back issues   Preparing meals?  Do you have Advanced Directives in place (Living Will, Healthcare Power or Attorney)? Yes    Last eye Exam and location? 2 days ago    Do you currently use prescribed or non-prescribed narcotic or opioid pain medications? Yes - some tramadol rxd by Dr. Fabian Sharp  Do you have a history or close family history of breast, ovarian, tubal or peritoneal cancer or a family member with BRCA (breast cancer susceptibility 1 and 2) gene mutations? No   Recorded in vitals.  it is Request home vitals (wt, BP, etc.) and enter into vitals, THEN update Vital Signs SmartPhrase below at the top of the HPI. See below.   Nurse/Assistant Credentials/time stamp: Tora Perches -CMA 9:40 am   ----------------------------------------------------------------------------------------------------------------------------------------------------------------------------------------------------------------------  Vital Signs: Unable to obtain new vitals due to this being a telehealth visit.   MEDICARE ANNUAL PREVENTIVE VISIT WITH PROVIDER: (Welcome to Medicare, initial annual wellness or annual wellness exam)  Virtual Visit via Video Note  I connected with Keiran Sandvik on 05/15/23 by a video enabled telemedicine application and verified that I am speaking with the correct person using two identifiers.  Location patient: home Location provider:work or home office Persons participating in the virtual visit: patient, provider  Concerns and/or follow up today:  stable, dealing with leg issues - sees specialist   See HM section in Epic for other details of completed HM.    ROS: negative for report of fevers, unintentional weight loss, vision changes, vision loss, hearing loss or change, chest pain, sob, hemoptysis, melena,  hematochezia, hematuria, falls, bleeding or bruising, thoughts of suicide or self harm, memory loss  Patient-completed extensive health risk assessment - reviewed and discussed with the patient: See Health Risk Assessment completed with patient prior to the visit either above or in recent phone note. This was reviewed in detailed with the patient today and appropriate recommendations, orders and referrals were placed as needed per Summary below and patient instructions.   Review of Medical History: -PMH, PSH, Family History and current specialty and care providers reviewed and updated and listed below   Patient Care Team: Panosh, Neta Mends, MD as PCP - Lucia Bitter, MD (Neurosurgery) Judyann Munson, MD as Consulting Physician (Infectious Diseases) Van Clines, MD as Consulting Physician (Neurology) Verner Chol, [facility] (Inactive) as Pharmacist (Pharmacist)   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   Urinary tract infection    Wears hearing aid     Past Surgical History:  Procedure Laterality Date   ABDOMINAL EXPOSURE N/A 05/17/2019   Procedure: ABDOMINAL EXPOSURE;  Surgeon: Chuck Hint, MD;  Location: [facility] OR;  Service: Vascular;  Laterality: N/A;   ABDOMINAL HYSTERECTOMY  1973   1/2 of ovary left cyst on ovary   ANTERIOR LUMBAR FUSION N/A 05/17/2019   Procedure: Lumbar five Sacral one  Anterior lumbar interbody fusion;  Surgeon: Barnett Abu, MD;  Location: [facility] OR;  Service: Neurosurgery;  Laterality: N/A;   APPENDECTOMY     removed with hysterectomy   COLONOSCOPY      LUMBAR WOUND DEBRIDEMENT  12/06/2011   Procedure: LUMBAR WOUND DEBRIDEMENT;  Surgeon: Barnett Abu, MD;  Location: [facility];  Service: Neurosurgery;  Laterality: N/A;  Repair of Pseudomeningocele Lumbar wound   ROTATOR CUFF REPAIR     left    shoulder rotator culff  Left 2006   spinal cord tumor  1987   2012 october     Social History   Socioeconomic History   Marital status: Widowed    Spouse name: Not on file   Number of children: Not on file   Years of education: Not on file   Highest education level: Doctorate  Occupational History   Not on file  Tobacco Use   Smoking status: Former    Current packs/day: 0.00    Average packs/day: 0.3 packs/day for 2.0 years (0.5 ttl pk-yrs)    Types: Cigarettes    Start date: 09/02/1965    Quit date: 09/03/1967    Years since quitting: 55.7   Smokeless tobacco: Never   Tobacco comments:    quit 48 years   Vaping Use   Vaping status: Never Used  Substance and Sexual Activity   Alcohol use: Yes  Alcohol/week: 4.0 standard drinks of alcohol    Types: 4 Glasses of wine per week    Comment: 4 a week   Drug use: No   Sexual activity: Not Currently    Partners: Male    Birth control/protection: Surgical    Comment: hysterectomy  Other Topics Concern   Not on file  Social History Narrative   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   Born [place] home in [place] since [place]   Right handed   One story home   Lives alone   Social Determinants of Health   Financial Resource Strain: Low Risk  (03/12/2023)   Overall Financial Resource Strain (CARDIA)    Difficulty of Paying Living Expenses: Not hard at all  Food Insecurity: No Food Insecurity (03/12/2023)   Hunger Vital Sign    Worried About Running Out of Food in the Last Year: Never true    Ran Out of Food in the Last Year: Never true  Transportation Needs: No Transportation Needs (03/12/2023)   PRAPARE - Administrator, Civil Service (Medical):  No    Lack of Transportation (Non-Medical): No  Physical Activity: Sufficiently Active (03/12/2023)   Exercise Vital Sign    Days of Exercise per Week: 6 days    Minutes of Exercise per Session: 30 min  Stress: Stress Concern Present (03/12/2023)   [employer] of Occupational Health - Occupational Stress Questionnaire    Feeling of Stress : To some extent  Social Connections: Moderately Integrated (03/12/2023)   Social Connection and Isolation Panel [NHANES]    Frequency of Communication with Friends and Family: More than three times a week    Frequency of Social Gatherings with Friends and Family: More than three times a week    Attends Religious Services: More than 4 times per year    Active Member of [employer] or Organizations: Yes    Attends Banker Meetings: More than 4 times per year    Marital Status: Widowed  Intimate Partner Violence: Not At Risk (08/14/2020)   Humiliation, Afraid, Rape, and Kick questionnaire    Fear of Current or Ex-Partner: No    Emotionally Abused: No    Physically Abused: No    Sexually Abused: No    Family History  Problem Relation Age of Onset   Stroke Mother        2010   Heart attack Father    Cancer Paternal Grandmother     Current Outpatient Medications on File Prior to Visit  Medication Sig Dispense Refill   Cholecalciferol (VITAMIN D3) 125 MCG (5000 UT) TABS Take 1 tablet by mouth daily.     cyanocobalamin 1000 MCG tablet Take 1,000 mcg by mouth daily.     lamoTRIgine (LAMICTAL) 25 MG tablet Take 2 tablets every evening 180 tablet 3   MAGNESIUM PO Take 1 tablet by mouth daily.     NONFORMULARY OR COMPOUNDED ITEM Vitamin E vaginal cream 200u/ml.  One ml pv twice weekly.  Disp. 3 month supply. 24 each 3   Probiotic Product (PROBIOTIC DAILY PO) Take by mouth.     pyridoxine (B-6) 200 MG tablet Take 200 mg by mouth daily.     traMADol (ULTRAM) 50 MG tablet TAKE 1 TABLET(50 MG) BY MOUTH TWICE DAILY AS NEEDED 60 tablet 0   No  current facility-administered medications on file prior to visit.    Allergies  Allergen Reactions   Ibuprofen-Famotidine Other (See Comments)    Elevated  BP   Fentanyl Other (See Comments)    Constipates for about 2 weeks   Pregabalin Swelling    AMS, Swelling   Penicillins Rash    Did it involve swelling of the face/tongue/throat, SOB, or low BP? No Did it involve sudden or severe rash/hives, skin peeling, or any reaction on the inside of your mouth or nose? No Did you need to seek medical attention at a hospital or doctor's office? No When did it last happen?      30+ years If all above answers are "NO", may proceed with cephalosporin use.        Physical Exam Vitals requested from patient and listed below if patient had equipment and was able to obtain at home for this virtual visit: Vitals:   05/15/23 0930  BP: 120/70   Estimated body mass index is 20.12 kg/m as calculated from the following:   Height as of 04/22/23: 5' (1.524 m).   Weight as of this encounter: 103 lb (46.7 kg).  EKG (optional): deferred due to virtual visit  GENERAL: alert, oriented, no acute distress detected, full vision exam deferred due to pandemic and/or virtual encounter   HEENT: atraumatic, conjunttiva clear, no obvious abnormalities on inspection of external nose and ears  NECK: normal movements of the head and neck  LUNGS: on inspection no signs of respiratory distress, breathing rate appears normal, no obvious gross SOB, gasping or wheezing  CV: no obvious cyanosis  MS: moves all visible extremities without noticeable abnormality  PSYCH/NEURO: pleasant and cooperative, no obvious depression or anxiety, speech and thought processing grossly intact, Cognitive function grossly intact  Flowsheet Row Office Visit from 03/20/2023 in [facility] [place]  PHQ-9 Total Score 4           05/15/2023   12:17 PM 03/20/2023    1:49 PM 09/10/2022    3:04 PM 04/29/2022     9:34 AM 06/18/2021   11:05 AM  Depression screen PHQ 2/9  Decreased Interest 0 0  0 0  Down, Depressed, Hopeless 0 0  0 0  PHQ - 2 Score 0 0  0 0  Altered sleeping  2 1    Tired, decreased energy  1 1    Change in appetite  1     Feeling bad or failure about yourself   0     Trouble concentrating  0     Moving slowly or fidgety/restless  0     Suicidal thoughts  0     PHQ-9 Score  4          09/27/2022    1:38 PM 02/21/2023   10:08 AM 03/12/2023   12:55 PM 03/20/2023    1:49 PM 05/15/2023   12:17 PM  Fall Risk  Falls in the past year? 1 1 1 1    Was there an injury with Fall? 0 1 1 0 0  Fall Risk Category Calculator 1 3 3 2    Patient at Risk for Falls Due to    Other (Comment) History of fall(s)  Fall risk Follow up Falls evaluation completed Falls evaluation completed  Falls evaluation completed Education provided     SUMMARY AND PLAN:  Encounter for Medicare annual wellness exam   Discussed applicable health maintenance/preventive health measures and advised and referred or ordered per patient preferences: -she sees endo for her bone density tests and will be seeing them this year, on prolia -discussed vaccines due  Health Maintenance  Topic  Date Due   DTaP/Tdap/Td (2 - Td or Tdap) 12/17/2020   INFLUENZA VACCINE  04/03/2023   COVID-19 Vaccine (6 - 2023-24 season) 05/04/2023   Medicare Annual Wellness (AWV)  05/14/2024   Pneumonia Vaccine 35+ Years old  Completed   DEXA SCAN  Completed   Zoster Vaccines- Shingrix  Completed   HPV VACCINES  Aged Out     Education and counseling on the following was provided based on the above review of health and a plan/checklist for the patient, along with additional information discussed, was provided for the patient in the patient instructions :  -Advised on importance of completing advanced directives, discussed options for completing and provided information in patient instructions as well -Advised and counseled on a healthy  lifestyle  -Reviewed patient's current diet. Advised and counseled on a whole foods based healthy diet. A summary of a healthy diet was provided in the Patient Instructions.  -reviewed patient's current physical activity level and discussed exercise guidelines for adults. Discussed community resources and ideas for safe exercise at home to assist in meeting exercise guideline recommendations in a safe and healthy way.  -Advise yearly dental visits at minimum and regular eye exams  Follow up: see patient instructions     Patient Instructions  I really enjoyed getting to talk with you today! I am available on Tuesdays and Thursdays for virtual visits if you have any questions or concerns, or if I can be of any further assistance.   CHECKLIST FROM ANNUAL WELLNESS VISIT:  -Follow up (please call to schedule if not scheduled after visit):   -yearly for annual wellness visit with primary care office  Here is a list of your preventive care/health maintenance measures and the plan for each if any are due:  PLAN For any measures below that may be due:   Health Maintenance  Topic Date Due   DTaP/Tdap/Td (2 - Td or Tdap) 12/17/2020   INFLUENZA VACCINE  04/03/2023   COVID-19 Vaccine (6 - 2023-24 season) 05/04/2023   Medicare Annual Wellness (AWV)  05/14/2024   Pneumonia Vaccine 76+ Years old  Completed   DEXA SCAN  Completed   Zoster Vaccines- Shingrix  Completed   HPV VACCINES  Aged Out    -See a dentist at least yearly  -Get your eyes checked and then per your eye specialist's recommendations  -Other issues addressed today:   -I have included below further information regarding a healthy whole foods based diet, physical activity guidelines for adults, stress management and opportunities for social connections. I hope you find this information useful.    -----------------------------------------------------------------------------------------------------------------------------------------------------------------------------------------------------------------------------------------------------------  NUTRITION: -eat real food: lots of colorful vegetables (half the plate) and fruits -5-7 servings of vegetables and fruits per day (fresh or steamed is best), exp. 2 servings of vegetables with lunch and dinner and 2 servings of fruit per day. Berries and greens such as kale and collards are great choices.  -consume on a regular basis: whole grains (make sure first ingredient on label contains the word "whole"), fresh fruits, fish, nuts, seeds, healthy oils (such as olive oil, avocado oil, grape seed oil) -may eat small amounts of dairy and lean meat on occasion, but avoid processed meats such as ham, bacon, lunch meat, etc. -drink water -try to avoid fast food and pre-packaged foods, processed meat -most experts advise limiting sodium to < 2300mg  per day, should limit further is any chronic conditions such as high blood pressure, heart disease, diabetes, etc. The American Heart Association advised that <  1500mg  is is ideal -try to avoid foods that contain any ingredients with names you do not recognize  -try to avoid sugar/sweets (except for the natural sugar that occurs in fresh fruit) -try to avoid sweet drinks -try to avoid white rice, white bread, pasta (unless whole grain), white or yellow potatoes  EXERCISE GUIDELINES FOR ADULTS: -if you wish to increase your physical activity, do so gradually and with the approval of your doctor -STOP and seek medical care immediately if you have any chest pain, chest discomfort or trouble breathing when starting or increasing exercise  -move and stretch your body, legs, feet and arms when sitting for long periods -Physical activity guidelines for optimal health in adults: -least 150 minutes per week of  aerobic exercise (can talk, but not sing) once approved by your doctor, 20-30 minutes of sustained activity or two 10 minute episodes of sustained activity every day.  -resistance training at least 2 days per week if approved by your doctor -balance exercises 3+ days per week:   Stand somewhere where you have something sturdy to hold onto if you lose balance.    1) lift up on toes, start with 5x per day and work up to 20x   2) stand and lift on leg straight out to the side so that foot is a few inches of the floor, start with 5x each side and work up to 20x each side   3) stand on one foot, start with 5 seconds each side and work up to 20 seconds on each side  If you need ideas or help with getting more active:  -Silver sneakers https://tools.silversneakers.com  -Walk with a Doc: http://www.duncan-williams.com/  -try to include resistance (weight lifting/strength building) and balance exercises twice per week: or the following link for ideas: http://castillo-powell.com/  BuyDucts.dk  STRESS MANAGEMENT: -can try meditating, or just sitting quietly with deep breathing while intentionally relaxing all parts of your body for 5 minutes daily -if you need further help with stress, anxiety or depression please follow up with your primary doctor or contact the wonderful folks at [facility]: [phone]  SOCIAL CONNECTIONS: -options in [place] if you wish to engage in more social and exercise related activities:  -Silver sneakers https://tools.silversneakers.com  -Walk with a Doc: http://www.duncan-williams.com/  -Check out the [facility] Active Adults 50+ section on the [place] of [employer] (hiking clubs, book clubs, cards and games, chess, exercise classes, aquatic classes and much more) - see the website for  details: https://www.-[place].gov/departments/parks-recreation/active-adults50  -YouTube has lots of exercise videos for different ages and abilities as well  -[facility] (a variety of indoor and outdoor inperson activities for adults). 917-520-0641. [address]  -Virtual Online Classes (a variety of topics): see seniorplanet.org or call [phone]  -consider volunteering at a school, hospice center, church, senior center or elsewhere           Terressa Koyanagi, DO

## 2023-05-15 NOTE — Patient Instructions (Signed)
I really enjoyed getting to talk with you today! I am available on Tuesdays and Thursdays for virtual visits if you have any questions or concerns, or if I can be of any further assistance.   CHECKLIST FROM ANNUAL WELLNESS VISIT:  -Follow up (please call to schedule if not scheduled after visit):   -yearly for annual wellness visit with primary care office  Here is a list of your preventive care/health maintenance measures and the plan for each if any are due:  PLAN For any measures below that may be due:   Health Maintenance  Topic Date Due   DTaP/Tdap/Td (2 - Td or Tdap) 12/17/2020   INFLUENZA VACCINE  04/03/2023   COVID-19 Vaccine (6 - 2023-24 season) 05/04/2023   Medicare Annual Wellness (AWV)  05/14/2024   Pneumonia Vaccine 58+ Years old  Completed   DEXA SCAN  Completed   Zoster Vaccines- Shingrix  Completed   HPV VACCINES  Aged Out    -See a dentist at least yearly  -Get your eyes checked and then per your eye specialist's recommendations  -Other issues addressed today:   -I have included below further information regarding a healthy whole foods based diet, physical activity guidelines for adults, stress management and opportunities for social connections. I hope you find this information useful.   -----------------------------------------------------------------------------------------------------------------------------------------------------------------------------------------------------------------------------------------------------------  NUTRITION: -eat real food: lots of colorful vegetables (half the plate) and fruits -5-7 servings of vegetables and fruits per day (fresh or steamed is best), exp. 2 servings of vegetables with lunch and dinner and 2 servings of fruit per day. Berries and greens such as kale and collards are great choices.  -consume on a regular basis: whole grains (make sure first ingredient on label contains the word "whole"), fresh fruits,  fish, nuts, seeds, healthy oils (such as olive oil, avocado oil, grape seed oil) -may eat small amounts of dairy and lean meat on occasion, but avoid processed meats such as ham, bacon, lunch meat, etc. -drink water -try to avoid fast food and pre-packaged foods, processed meat -most experts advise limiting sodium to < 2300mg  per day, should limit further is any chronic conditions such as high blood pressure, heart disease, diabetes, etc. The American Heart Association advised that < 1500mg  is is ideal -try to avoid foods that contain any ingredients with names you do not recognize  -try to avoid sugar/sweets (except for the natural sugar that occurs in fresh fruit) -try to avoid sweet drinks -try to avoid white rice, white bread, pasta (unless whole grain), white or yellow potatoes  EXERCISE GUIDELINES FOR ADULTS: -if you wish to increase your physical activity, do so gradually and with the approval of your doctor -STOP and seek medical care immediately if you have any chest pain, chest discomfort or trouble breathing when starting or increasing exercise  -move and stretch your body, legs, feet and arms when sitting for long periods -Physical activity guidelines for optimal health in adults: -least 150 minutes per week of aerobic exercise (can talk, but not sing) once approved by your doctor, 20-30 minutes of sustained activity or two 10 minute episodes of sustained activity every day.  -resistance training at least 2 days per week if approved by your doctor -balance exercises 3+ days per week:   Stand somewhere where you have something sturdy to hold onto if you lose balance.    1) lift up on toes, start with 5x per day and work up to 20x   2) stand and lift on leg straight  out to the side so that foot is a few inches of the floor, start with 5x each side and work up to 20x each side   3) stand on one foot, start with 5 seconds each side and work up to 20 seconds on each side  If you need  ideas or help with getting more active:  -Silver sneakers https://tools.silversneakers.com  -Walk with a Doc: http://www.duncan-williams.com/  -try to include resistance (weight lifting/strength building) and balance exercises twice per week: or the following link for ideas: http://castillo-powell.com/  BuyDucts.dk  STRESS MANAGEMENT: -can try meditating, or just sitting quietly with deep breathing while intentionally relaxing all parts of your body for 5 minutes daily -if you need further help with stress, anxiety or depression please follow up with your primary doctor or contact the wonderful folks at [facility]: [phone]  SOCIAL CONNECTIONS: -options in [place] if you wish to engage in more social and exercise related activities:  -Silver sneakers https://tools.silversneakers.com  -Walk with a Doc: http://www.duncan-williams.com/  -Check out the [facility] Active Adults 50+ section on the [place] [employer] (hiking clubs, book clubs, cards and games, chess, exercise classes, aquatic classes and much more) - see the website for details: https://www.[place].gov/departments/parks-recreation/active-adults50  -YouTube has lots of exercise videos for different ages and abilities as well  -[facility] (a variety of indoor and outdoor inperson activities for adults). ([phone]. [address]  -Virtual Online Classes (a variety of topics): see seniorplanet.org or call 5181731109  -consider volunteering at a school, hospice center, church, senior center or elsewhere

## 2023-05-19 ENCOUNTER — Ambulatory Visit: Payer: Medicare PPO | Admitting: Obstetrics and Gynecology

## 2023-05-19 ENCOUNTER — Encounter: Payer: Self-pay | Admitting: Obstetrics and Gynecology

## 2023-05-19 VITALS — BP 93/58 | HR 84

## 2023-05-19 DIAGNOSIS — N393 Stress incontinence (female) (male): Secondary | ICD-10-CM

## 2023-05-19 DIAGNOSIS — N8110 Cystocele, unspecified: Secondary | ICD-10-CM | POA: Diagnosis not present

## 2023-05-19 MED ORDER — NONFORMULARY OR COMPOUNDED ITEM: 3 refills | Status: DC

## 2023-05-19 NOTE — Progress Notes (Signed)
[facility]   Subjective:     Chief Complaint:  Chief Complaint  Patient presents with   Pessary Check    Mirel Surratt is a 82 y.o. female is here for 3 month pessary check.   History of Present Illness: Sutten Junes is a 82 y.o. female with stage II pelvic organ prolapse who presents for a pessary check. She is using a size 2 1/4in incontinence ring with support pessary. The pessary has been working well and she has no complaints. She is not using vaginal estrogen as she is using compounded vitamin E cream. She denies vaginal bleeding.  Past Medical History: Patient  has a past medical history of Anemia, Arthritis, Back pain, Cancer (HCC), Depression, Leg weakness, Osteopenia, PONV (postoperative nausea and vomiting), Right leg pain, Seizures (HCC), Spinal cord tumor, Urinary tract infection, and Wears hearing aid.   Past Surgical History: She  has a past surgical history that includes Abdominal hysterectomy (1973); shoulder rotator culff  (Left, 2006); spinal cord tumor (1987); Appendectomy; Rotator cuff repair; Lumbar wound debridement (12/06/2011); Colonoscopy; Anterior lumbar fusion (N/A, 05/17/2019); and Abdominal exposure (N/A, 05/17/2019).   Medications: She has a current medication list which includes the following prescription(s): vitamin d3, cyanocobalamin, lamotrigine, magnesium, probiotic product, pyridoxine, tramadol, and NONFORMULARY OR COMPOUNDED ITEM.   Allergies: Patient is allergic to ibuprofen-famotidine, fentanyl, pregabalin, and penicillins.   Social History: Patient  reports that she quit smoking about 55 years ago. Her smoking use included cigarettes. She started smoking about 57 years ago. She has a 0.5 pack-year smoking history. She has never used smokeless tobacco. She reports current alcohol use of about 4.0 standard drinks of alcohol per week. She reports that she does not use drugs.      Objective:    Physical Exam: BP (!) 93/58    Pulse 84   LMP  (LMP Unknown)  Gen: No apparent distress, A&O x 3. Detailed Urogynecologic Evaluation:  Pelvic Exam: Normal external female genitalia; Bartholin's and Skene's glands normal in appearance; urethral meatus normal in appearance, no urethral masses or discharge. The pessary was noted to be in place. It was removed and cleaned. Speculum exam revealed no lesions in the vagina. The pessary was replaced. It was comfortable to the patient and fit well.    Assessment/Plan:    Assessment: Ms. Assante is a 82 y.o. with stage II pelvic organ prolapse here for a pessary check. She is doing well.  Plan: She will keep the pessary in place until next visit. She will continue to use vitamin E oil. She will follow-up in 4 months for a pessary check or sooner as needed.   Compounded vitamin e cream refill faxed to [facility].   All questions were answered.

## 2023-05-21 ENCOUNTER — Ambulatory Visit: Payer: Medicare PPO

## 2023-05-21 DIAGNOSIS — R0683 Snoring: Secondary | ICD-10-CM

## 2023-05-21 DIAGNOSIS — G4733 Obstructive sleep apnea (adult) (pediatric): Secondary | ICD-10-CM | POA: Diagnosis not present

## 2023-05-22 ENCOUNTER — Telehealth: Payer: Self-pay | Admitting: Internal Medicine

## 2023-05-22 DIAGNOSIS — G4733 Obstructive sleep apnea (adult) (pediatric): Secondary | ICD-10-CM

## 2023-05-22 NOTE — Telephone Encounter (Signed)
Pt called in to get he results from her sleep study

## 2023-05-23 NOTE — Telephone Encounter (Signed)
Dr. Maple Hudson, please advise on the results of pt's recent HST.   **Please please route back to triage pool and not directly back to me**

## 2023-05-24 NOTE — Telephone Encounter (Signed)
Prolia VOB initiated via [email or web address]  Next Prolia inj DUE: 06/20/23

## 2023-05-26 NOTE — Telephone Encounter (Signed)
Home sleep test showed severe obstructive sleep apnea, averaging 34 apneas/ hour. I recommend we order new DME, new CPAP auto 5-15, mask of choice, humidifier, supplies, AirView/ card  We will need to see her again in 31-90 days

## 2023-05-26 NOTE — Telephone Encounter (Signed)
Patient would like order for CPAP machine. Patient phone number is [phone].

## 2023-05-27 NOTE — Telephone Encounter (Signed)
PT calling once again about where her CPAP is. Pls call @ [phone]   Be Patient, I said.

## 2023-05-28 NOTE — Telephone Encounter (Signed)
Home sleep test showed severe obstructive sleep apnea, averaging 34 apneas/ hour. I recommend we order new DME, new CPAP auto 5-15, mask of choice, humidifier, supplies, AirView/ card   We will need to see her again in 31-90 days

## 2023-06-03 ENCOUNTER — Ambulatory Visit: Payer: Medicare PPO | Admitting: Sports Medicine

## 2023-06-05 NOTE — Telephone Encounter (Signed)
I called and spoke with the pt and notified of results  She verbalized understanding  CPAP ordered  She is aware to keep pending ov 07/24/23  Nothing further needed

## 2023-06-06 DIAGNOSIS — I10 Essential (primary) hypertension: Secondary | ICD-10-CM | POA: Diagnosis not present

## 2023-06-06 DIAGNOSIS — M4180 Other forms of scoliosis, site unspecified: Secondary | ICD-10-CM | POA: Diagnosis not present

## 2023-06-07 NOTE — Telephone Encounter (Signed)
Prior Auth REQUIRED for Prolia  Prior auth on file and valid  Prior Auth: APPROVED [place] Case ID #: [id number] Valid: 11/12/19-09/02/23

## 2023-06-16 DIAGNOSIS — G4733 Obstructive sleep apnea (adult) (pediatric): Secondary | ICD-10-CM | POA: Diagnosis not present

## 2023-06-17 ENCOUNTER — Ambulatory Visit: Payer: Medicare PPO | Admitting: Internal Medicine

## 2023-06-20 ENCOUNTER — Ambulatory Visit: Payer: Medicare PPO | Admitting: Internal Medicine

## 2023-06-20 ENCOUNTER — Ambulatory Visit: Payer: Medicare PPO

## 2023-06-20 ENCOUNTER — Encounter: Payer: Self-pay | Admitting: Internal Medicine

## 2023-06-20 VITALS — BP 124/60 | HR 91 | Ht 60.0 in | Wt 104.0 lb

## 2023-06-20 DIAGNOSIS — E559 Vitamin D deficiency, unspecified: Secondary | ICD-10-CM | POA: Diagnosis not present

## 2023-06-20 DIAGNOSIS — E871 Hypo-osmolality and hyponatremia: Secondary | ICD-10-CM

## 2023-06-20 DIAGNOSIS — M810 Age-related osteoporosis without current pathological fracture: Secondary | ICD-10-CM | POA: Diagnosis not present

## 2023-06-20 MED ORDER — DENOSUMAB 60 MG/ML ~~LOC~~ SOSY
60.0000 mg | PREFILLED_SYRINGE | Freq: Once | SUBCUTANEOUS | Status: AC
  Administered 2023-06-20: 60 mg via SUBCUTANEOUS

## 2023-06-20 NOTE — Progress Notes (Unsigned)
Patient ID: Kaitlyn Flowers, female   DOB: Sep 07, 1940, 82 y.o.   MRN: [id number]  HPI  Kaitlyn Flowers is a 82 y.o.-year-old female, returning for f/u for of hyponatremia and OP. Last visit 1 year ago.  Interim history: No fractures since last visit.  No recent falls.  Her scoliosis continues to progress.  She has significant pain. She is seen in the scoliosis clinic at [facility]. She had a fusion sx at L5-S1 to help with back pain, right leg weakness and foot drop, but this did not help >> was told she have to have 4 vertebrae fused, but wanted a second opinion at [facility] >> no surgery was recommended.  She continues to have R foot drop. She recently saw a neurosurgeon in [place] >> May need to have a sacral fusion to prevent the nerves from continuing to degenerate.  Before that, she needs to go through physical therapy again and injections. She was diagnosed with sleep apnea >> now just started on a CPAP machine.   Hyponatremia:  Reviewed history:  Patient had orthostatic syncope on 03/03/2016 and went to the emergency room.  After this, she again returned to ED with difficulty speaking, mild confusion and blurry vision on 03/22/2016.  CT head was negative for stroke and this was assumed to be a TIA or neuro changes from UTI.  However, the sodium level was found to be low, at 123.  MRI/MRA on 06/08/2016 did not show a pituitary mass. It showed chronic microvascular ds.  She started to have UTIs in the beginning of 2017 (Pseudomonas)-initially treated with PICC line IV antibiotics, and then p.o. antibiotics.  She had several recurrences.  She had a urinary catheter which she was using 3 times daily as it was suspected that she could not empty her bladder well.  The frequency of her UTIs decreased, then only cathing once a day. She then had a pessary placed >> not cathing anymore.  She started to drink more water after these episodes to improve her urinary flow, and also as she developed dry  mouth.   At the end of 2017, she developed Wieting aphasia, confusion, and then verbal aphasia which lasted 30 minutes.  At that time, she saw her PCP.  The sodium returned slightly low, at 132.  The episodes repeated and she also developed headaches.  She went to the emergency room.  Sodium was lower, at 129.  She subsequently saw Dr. Karel Jarvis (neurology).  Of note, she also has severe, chronic, low back pain, with neuropathy left leg.  She tried a spinal cord stimulator but this did not work.  She is seen in the scoliosis clinic at [facility].  Her scoliosis is progressing.  Is and Os: Approximately 1.5 to 2 L a day. (1200 ml water +  240 ml coffee + 240 ml hot water). No headaches or increased thirst.  Reviewed previous sodium levels: Lab Results  Component Value Date   NA 133 (L) 09/13/2022   NA 139 03/22/2022   NA 138 05/03/2021   NA 136 03/30/2020   NA 136 09/29/2019   NA 131 (L) 09/19/2019   NA 127 (L) 09/18/2019   NA 134 (L) 08/16/2019   NA 135 05/13/2019   NA 136 07/22/2018   NA 137 04/29/2018   NA 132 (L) 09/26/2017   NA 135 06/24/2017   NA 133 (A) 02/19/2017   NA 133 (L) 12/23/2016   Reviewed other pertinent labs from 04/2016: Osmolality     278 -  305 mOsm/kg 271 (L)  Cortisol, Plasma     ug/dL 95.2  W413 ACTH     6 - 50 pg/mL 16  Uric Acid, Serum     2.4 - 7.0 mg/dL 2.7  Osmolality, Urine     50 - 1,200 mOsm/kg 304  Potassium Urine     12 - 129 mmol/L 28  Sodium, Urine     28 - 272 mmol/L 77  Labs pointed towards improving SIADH/water overload without appropriate renal water wasting.    No history of adrenal insufficiency, hypothyroidism, hypertriglyceridemia.  OP: Reviewed history: She was diagnosed with osteoporosis in 2015.  She had a right ankle fracture rollerskating.  She denies dizziness/vertigo/orthostasis.  Reviewed her previous DXA scan reports: 09/26/2020 ([place]) Lumbar spine L1-L4 (L2) Femoral neck (FN)  T-score +0.2 RFN: -2.5 LFN: -2.5   Change in BMD from previous DXA test (%) +13%* +2.7%  (*) statistically significant  01/08/2018 ([place]) Lumbar spine L1-L4 (L2) Femoral neck (FN)  T-score -0.1 RFN: -2.7 LFN: -2.5  Change in BMD from previous DXA test (%) +11.1%* -1.0%  (*) statistically significant  Date L1-L4 T score FN T score  12/10/2013 -1.7 RFN: -2.7 LFN: -2.5   L1-L4 decreased by 5.8% from 2012 Total mean hip BMD decreased 7.7%* from 2012    01/08/2016 Lumbar spine (L1-L4) Femoral neck (FN)  T-score - 1.9  RFN: - 2.5 LFN: - 2.7  Change in BMD from previous DXA test (%) - 1.9%  + 0.4%   (*) statistically significant  Previous treatments: - I suggested Fosamax weekly but she refused in 03/2016 - we started Prolia 09/03/2018.  Subsequent doses: 03/26/2019, 11/30/2019, 09/12/2020, 03/13/2021, 10/10/2021, 06/13/2022, 12/18/2022 She tolerates this well.  No history of persistent hyper or hypercalcemia, no history of kidney stones. Lab Results  Component Value Date   PTH 59 02/13/2016   PTH 66 (H) 07/11/2014   CALCIUM 9.5 09/13/2022   CALCIUM 9.4 03/22/2022   CALCIUM 9.5 05/03/2021   CALCIUM 8.9 03/30/2020   CALCIUM 9.4 09/29/2019   CALCIUM 7.3 (L) 09/19/2019   CALCIUM 7.6 (L) 09/18/2019   CALCIUM 9.6 08/16/2019   CALCIUM 9.0 05/13/2019   CALCIUM 9.8 07/22/2018   No history of thyrotoxicosis: Lab Results  Component Value Date   TSH 1.42 03/22/2022   TSH 0.83 09/26/2017   TSH 1.28 02/13/2016   TSH 1.21 07/11/2014   She does have a history of vitamin D deficiency.  At last visit she was taking approximately 3000 to 5000 units of vitamin D daily.  I advised her to increase the dose to 5000 units daily.  Vitamin D levels: Lab Results  Component Value Date   VD25OH 26.6 (L) 05/03/2021   VD25OH 46.63 04/29/2018   VD25OH 78.21 12/23/2016   VD25OH 64.30 07/22/2016   VD25OH 55.43 07/09/2016   VD25OH 94.31 04/02/2016   VD25OH 60.65 02/10/2014   She was on calcium citrate >> now off.  No CKD. Last  BUN/Cr: Lab Results  Component Value Date   BUN 31 (H) 09/13/2022   CREATININE 1.03 (H) 09/13/2022   She does Pilates for exercise (including weight bearing exercises). Also in PT now for weakness in R leg.   She has a family history of osteoporosis in mother/sisters.  Mother had kyphosis.  Menopause was at 61 years old surgical TAH + BSO.  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 She has degenerative scoliosis, seen at [facility]. She has  a history of frequent UTIs, on nitrofurantoin.  ROS: + See HPI  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   Urinary tract infection    Wears hearing aid    Past Surgical History:  Procedure Laterality Date   ABDOMINAL EXPOSURE N/A 05/17/2019   Procedure: ABDOMINAL EXPOSURE;  Surgeon: Chuck Hint, MD;  Location: [facility] OR;  Service: Vascular;  Laterality: N/A;   ABDOMINAL HYSTERECTOMY  1973   1/2 of ovary left cyst on ovary   ANTERIOR LUMBAR FUSION N/A 05/17/2019   Procedure: Lumbar five Sacral one  Anterior lumbar interbody fusion;  Surgeon: Barnett Abu, MD;  Location: [facility] OR;  Service: Neurosurgery;  Laterality: N/A;   APPENDECTOMY     removed with hysterectomy   COLONOSCOPY     LUMBAR WOUND DEBRIDEMENT  12/06/2011   Procedure: LUMBAR WOUND DEBRIDEMENT;  Surgeon: Barnett Abu, MD;  Location: [facility];  Service: Neurosurgery;  Laterality: N/A;  Repair of Pseudomeningocele Lumbar wound   ROTATOR CUFF REPAIR     left    shoulder rotator culff  Left 2006   spinal cord tumor   1987   2012 october    History   Social History   Marital Status: married    Spouse Name: N/A    Number of Children: 2   Occupational History   Research scientist (medical)   Social History Main Topics   Smoking status: Former Smoker -- 0.25 packs/day for 2 years    Types: Cigarettes    Quit date: 09/03/1967   Smokeless tobacco: Not on file   Alcohol Use: 2.4 oz/week    4-5 Glasses of wine per week   Drug Use: No   Social History Narrative   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     cyanocobalamin 1000 MCG tablet Take 1,000 mcg by mouth daily.     lamoTRIgine (LAMICTAL) 25 MG tablet Take 2 tablets every evening 180 tablet 3   MAGNESIUM PO Take 1 tablet by mouth daily.     NONFORMULARY OR COMPOUNDED ITEM Vitamin E vaginal cream 200u/ml.  One ml pv twice weekly.  Disp. 3 month supply. 24 each 3   Probiotic Product (PROBIOTIC DAILY PO) Take by mouth.     pyridoxine (B-6) 200 MG tablet Take 200 mg by mouth daily.     traMADol (ULTRAM) 50 MG tablet TAKE 1 TABLET(50 MG) BY MOUTH TWICE DAILY AS NEEDED 60 tablet 0   No current facility-administered medications on file prior to visit.   Allergies  Allergen Reactions   Ibuprofen-Famotidine Other (See Comments)    Elevated BP   Fentanyl Other (See Comments)    Constipates for about 2 weeks   Pregabalin Swelling  AMS, Swelling   Penicillins Rash    Did it involve swelling of the face/tongue/throat, SOB, or low BP? No Did it involve sudden or severe rash/hives, skin peeling, or any reaction on the inside of your mouth or nose? No Did you need to seek medical attention at a hospital or doctor's office? No When did it last happen?      30+ years If all above answers are "NO", may proceed with  cephalosporin use.    Family History  Problem Relation Age of Onset   Stroke Mother        2010   Heart attack Father    Cancer Paternal Grandmother    PE: BP 124/60   Pulse 91   Ht 5' (1.524 m)   Wt 104 lb (47.2 kg)   LMP  (LMP Unknown)   SpO2 99%   BMI 20.31 kg/m  Wt Readings from Last 3 Encounters:  06/20/23 104 lb (47.2 kg)  05/15/23 103 lb (46.7 kg)  04/22/23 103 lb (46.7 kg)   Constitutional: normal weight, in NAD.  She walks with a cane.  Antalgic gait. Eyes: EOMI, no exophthalmos ENT: no thyromegaly, no cervical lymphadenopathy Cardiovascular: RRR, No MRG Respiratory: CTA B Musculoskeletal: + deformities: Significant scoliosis Skin: no rashes Neurological: + tremor with outstretched hands - R>L  Assessment: 1.  Hyponatremia  2.  Osteoporosis  Plan: 1. Hyponatremia -She developed hyponatremia in 03/2016.  She initially presented with orthostatic syncope and difficulty speaking, mild confusion, blurry vision, in the setting of a UTI.  Head CT and MRI were normal and her symptoms resolved.  Sodium was found to be 123.  Subsequent sodium levels have been improving and then normalized.  Her latest sodium level was slightly low, at 133 on 09/13/2022.  This is the first low value that she had since 2021.   -Prior investigation pointed towards either SIADH or increase water intake that overcame the renal mechanism of urine dilution (I suspect the latter). -Of note, she has a history of seizures and is on Lamictal, but no recent episodes -We continue to manage her with fluid restriction (intended approximately 1.5 L a day, but realistically getting approximately 1.5-2 L a day) -We discussed about salt tablets use in the past, but she does have labile blood pressure and I did not feel that this was a good option for her -We will recheck her sodium today  2. Osteoporosis -No falls or fractures since last visit -She continues on Prolia without side effects: No  jaw/thigh/hip pain -Last injection was 6 months ago, will give her another injection today -Reviewing her previous bone density reports: In 12/2017 her femoral scores were still in the osteoporosis range but fairly stable from 2017 and a spine T-score was slightly higher.  However, L2 vertebra was excluded from analysis due to degenerative changes in 2019 compared to the scan from 2017, in which all vertebrae were analyzed.  She had another bone density scan in 09/2020 which showed that the lumbar bone density was increased significantly, while the bone density at the femoral necks were stable. -She is due for another bone density scan now-will order -Patient scoliosis appears to be worsening: On the lumbar CT from 10/2020, there was significant progressive scoliosis since 2018 -Of note, she has a history of meningioma resected from her thoracic spine, followed by skeletal radiotherapy.  This would contraindicate the use of teriparatide or abaloparatide due to the increased risk of osteosarcoma. This would have been ideal for her  since she also possibly needed spine surgery, however, she is not a candidate for scoliosis surgery but based on a discussion with the neurosurgeon in [place], she may have a limited intervention, on the sacrum. -As of now, she is tolerating Prolia well and we will continue with this -She also continues exercise: Yoga, Pilates, walking.  He also does weightbearing exercises with a Gaffer. -Again discussed about getting 1200 mg of calcium daily in her diet -she is having a good diet including almond milk, greens, legumes, and nuts.  I advised her if she is not getting this amt, to supplement calcium tablets. -At today's visit we will check a vitamin D, calcium, kidney function.  She continues 5000 units vitamin D daily. -I will see her back in 1 year  Carlus Pavlov, MD PhD [facility]

## 2023-06-20 NOTE — Patient Instructions (Addendum)
Please call and schedule bone density scan at the [facility] Office: [phone].   Please make sure you get ~1200 mg calcium from the diet preferentially from the diet.  Please stop at the lab.  You should have an endocrinology follow-up appointment in 1 year.

## 2023-06-21 LAB — BASIC METABOLIC PANEL
BUN/Creatinine Ratio: 36 (calc) — ABNORMAL HIGH (ref 6–22)
BUN: 34 mg/dL — ABNORMAL HIGH (ref 7–25)
CO2: 29 mmol/L (ref 20–32)
Calcium: 10.5 mg/dL — ABNORMAL HIGH (ref 8.6–10.4)
Chloride: 98 mmol/L (ref 98–110)
Creat: 0.94 mg/dL (ref 0.60–0.95)
Glucose, Bld: 86 mg/dL (ref 65–99)
Potassium: 4.7 mmol/L (ref 3.5–5.3)
Sodium: 137 mmol/L (ref 135–146)

## 2023-06-21 LAB — VITAMIN D 25 HYDROXY (VIT D DEFICIENCY, FRACTURES): Vit D, 25-Hydroxy: 76 ng/mL (ref 30–100)

## 2023-06-22 NOTE — Telephone Encounter (Signed)
Pt ready for scheduling on or after 06/20/23  Out-of-pocket cost due at time of visit: $40  Primary: [employer] [place] [employer] PPO Prolia co-insurance: $40 Admin fee co-insurance: 0%  Deductible: does not apply  Prior Auth APPROVED [place] Case ID #: [id number] Valid: 11/12/19-09/02/23  Secondary: N/A Prolia co-insurance:  Admin fee co-insurance:  Deductible:  Prior Auth:  PA# Valid:     ** This summary of benefits is an estimation of the patient's out-of-pocket cost. Exact cost may very based on individual plan coverage.

## 2023-06-22 NOTE — Telephone Encounter (Signed)
Last Prolia inj 06/20/23 Next Prolia inj due 12/20/23

## 2023-06-23 ENCOUNTER — Other Ambulatory Visit: Payer: Self-pay | Admitting: Internal Medicine

## 2023-06-23 DIAGNOSIS — R52 Pain, unspecified: Secondary | ICD-10-CM

## 2023-06-25 ENCOUNTER — Inpatient Hospital Stay: Admission: RE | Admit: 2023-06-25 | Payer: Medicare PPO | Source: Ambulatory Visit

## 2023-06-25 ENCOUNTER — Ambulatory Visit: Payer: Medicare PPO | Admitting: Sports Medicine

## 2023-06-25 ENCOUNTER — Other Ambulatory Visit: Payer: Self-pay | Admitting: Neurological Surgery

## 2023-06-25 DIAGNOSIS — M4180 Other forms of scoliosis, site unspecified: Secondary | ICD-10-CM

## 2023-06-25 NOTE — Telephone Encounter (Signed)
So unclear if this med was dced  If still taking this please ascertain how often and for which pain  In order to refill medication.   (Depending on how oftern used may need Appt  virtual or  other for med check since this is a controlled substance . )

## 2023-06-27 ENCOUNTER — Other Ambulatory Visit: Payer: Medicare PPO

## 2023-07-03 ENCOUNTER — Ambulatory Visit: Payer: Medicare PPO | Admitting: Internal Medicine

## 2023-07-03 DIAGNOSIS — R0609 Other forms of dyspnea: Secondary | ICD-10-CM | POA: Diagnosis not present

## 2023-07-03 LAB — PULMONARY FUNCTION TEST
DL/VA % pred: 86 %
DL/VA: 3.64 ml/min/mmHg/L
DLCO cor % pred: 69 %
DLCO cor: 11.27 ml/min/mmHg
DLCO unc % pred: 69 %
DLCO unc: 11.27 ml/min/mmHg
FEF 25-75 Post: 1.33 L/s
FEF 25-75 Pre: 1.5 L/s
FEF2575-%Change-Post: -11 %
FEF2575-%Pred-Post: 117 %
FEF2575-%Pred-Pre: 132 %
FEV1-%Change-Post: -2 %
FEV1-%Pred-Post: 97 %
FEV1-%Pred-Pre: 99 %
FEV1-Post: 1.49 L
FEV1-Pre: 1.53 L
FEV1FVC-%Change-Post: -9 %
FEV1FVC-%Pred-Pre: 108 %
FEV6-%Change-Post: 7 %
FEV6-%Pred-Post: 104 %
FEV6-%Pred-Pre: 97 %
FEV6-Post: 2.06 L
FEV6-Pre: 1.91 L
FEV6FVC-%Pred-Post: 106 %
FEV6FVC-%Pred-Pre: 106 %
FVC-%Change-Post: 7 %
FVC-%Pred-Post: 98 %
FVC-%Pred-Pre: 91 %
FVC-Post: 2.06 L
FVC-Pre: 1.91 L
Post FEV1/FVC ratio: 73 %
Post FEV6/FVC ratio: 100 %
Pre FEV1/FVC ratio: 80 %
Pre FEV6/FVC Ratio: 100 %
RV % pred: 98 %
RV: 2.17 L
TLC % pred: 95 %
TLC: 4.26 L

## 2023-07-03 NOTE — Patient Instructions (Signed)
Full PFT performed today. °

## 2023-07-03 NOTE — Progress Notes (Signed)
Full PFT performed today. °

## 2023-07-09 ENCOUNTER — Other Ambulatory Visit: Payer: Medicare PPO

## 2023-07-10 ENCOUNTER — Ambulatory Visit
Admission: RE | Admit: 2023-07-10 | Discharge: 2023-07-10 | Disposition: A | Discharge status: Home / Self Care | Payer: Medicare PPO | Source: Ambulatory Visit | Attending: Neurological Surgery | Admitting: Neurological Surgery

## 2023-07-10 DIAGNOSIS — Z981 Arthrodesis status: Secondary | ICD-10-CM | POA: Diagnosis not present

## 2023-07-10 DIAGNOSIS — M47816 Spondylosis without myelopathy or radiculopathy, lumbar region: Secondary | ICD-10-CM | POA: Diagnosis not present

## 2023-07-10 DIAGNOSIS — M549 Dorsalgia, unspecified: Secondary | ICD-10-CM | POA: Diagnosis not present

## 2023-07-10 DIAGNOSIS — M4180 Other forms of scoliosis, site unspecified: Secondary | ICD-10-CM

## 2023-07-10 DIAGNOSIS — M5126 Other intervertebral disc displacement, lumbar region: Secondary | ICD-10-CM | POA: Diagnosis not present

## 2023-07-10 MED ORDER — GADOPICLENOL 0.5 MMOL/ML IV SOLN
5.0000 mL | Freq: Once | INTRAVENOUS | Status: AC | PRN
  Administered 2023-07-10: 5 mL via INTRAVENOUS

## 2023-07-17 DIAGNOSIS — G4733 Obstructive sleep apnea (adult) (pediatric): Secondary | ICD-10-CM | POA: Diagnosis not present

## 2023-07-22 ENCOUNTER — Ambulatory Visit: Payer: Medicare PPO | Admitting: Internal Medicine

## 2023-07-22 NOTE — Progress Notes (Unsigned)
04/21/23- 82 yoF for sleep evaluation courtesy of Dr Fabian Sharp with concerns of snoring, daytime tiredness, witnessed apnea when son visited. Medical problem list includes Autonomic Dysfunction, Epilepsy/ Lamictal, R leg weakness, Osteoporosis, Spondylolisthesis,  Epworth score 8 Body eight today- 103 lbs Son tells her of loud snoring and witnessed apneas. She is concerned about daytime tiredness in recent months.  Not aware of complex parasomnias. Hx ENT surgery- tonsils. Denies heart/ lung problems. No sleep meds. 1 cup coffee.  Bedtime 10:30-11:30, up x1, then up 8AM. She also is concerned about easier DOE in last 2 months w/o cough or wheeze, and wonders if this is from slowly progressive scoliosis. She blames scoliosis on extensive XRT at age 11 for spinal tumor. She noticed this DOE at first if she didn't exercise regularly, and it would resolve as she became more active. Now it seems to persist.  07/24/23- 82 yoF for sleep evaluation courtesy of Dr Fabian Sharp with concerns of snoring, daytime tiredness, witnessed apnea when son visited. Medical problem list includes Autonomic Dysfunction, Epilepsy/ Lamictal, R leg weakness, Osteoporosis, Spondylolisthesis,  HST 05/01/23- AHI 34.8/hr, snoring with desaturation to 57%/ 73 minutes</= 88%, body weight 103 lbs CPAP auto 5-15/ [employer]   ordered 06/05/23 Download compliance  Body weight today [employer] manager reported that patient has been adjusting pressure on own. PFT 07/03/23- mild Diffusion defect    ROS-see HPI   + = positive Constitutional:    weight loss, night sweats, fevers, chills, fatigue, lassitude. HEENT:    headaches, difficulty swallowing, tooth/dental problems, sore throat,       sneezing, itching, ear ache, nasal congestion, post nasal drip, snoring CV:    chest pain, orthopnea, PND, swelling in lower extremities, anasarca,                                  dizziness, palpitations Resp:   shortness of breath with exertion or at rest.                 productive cough,   non-productive cough, coughing up of blood.              change in color of mucus.  wheezing.   Skin:    rash or lesions. GI:  No-   heartburn, indigestion, abdominal pain, nausea, vomiting, diarrhea,                 change in bowel habits, loss of appetite GU: dysuria, change in color of urine, no urgency or frequency.   flank pain. MS:   joint pain, stiffness, decreased range of motion, back pain. Neuro-     nothing unusual Psych:  change in mood or affect.  depression or anxiety.   memory loss.  OBJ- Physical Exam    +petite General- Alert, Oriented, Affect-appropriate, Distress- none acute Skin- rash-none, lesions- none, excoriation- none Lymphadenopathy- none Head- atraumatic            Eyes- Gross vision intact, PERRLA, conjunctivae and secretions clear            Ears- Hearing, canals-normal            Nose- Clear, no-Septal dev, mucus, polyps, erosion, perforation             Throat- Mallampati III-IV , mucosa clear , drainage- none, tonsils-absent, +teeth, Neck- flexible , trachea midline, no stridor , thyroid nl, carotid no bruit Chest - symmetrical excursion , unlabored  Heart/CV- RRR , no murmur , no gallop  , no rub, nl s1 s2                           - JVD- none , edema- none, stasis changes- none, varices- none           Lung- clear to P&A, wheeze- none, cough- none , dullness-none, rub- none           Chest wall- +scoliosis- had to stand for comfort. Abd-  Br/ Gen/ Rectal- Not done, not indicated Extrem- cyanosis- none, clubbing, none, atrophy- none, strength- nl Neuro- grossly intact to observation

## 2023-07-24 ENCOUNTER — Encounter: Payer: Self-pay | Admitting: Internal Medicine

## 2023-07-24 ENCOUNTER — Ambulatory Visit: Payer: Medicare PPO | Admitting: Internal Medicine

## 2023-07-24 VITALS — BP 118/64 | HR 85 | Ht 60.0 in | Wt 105.4 lb

## 2023-07-24 DIAGNOSIS — R5383 Other fatigue: Secondary | ICD-10-CM

## 2023-07-24 DIAGNOSIS — G4733 Obstructive sleep apnea (adult) (pediatric): Secondary | ICD-10-CM | POA: Diagnosis not present

## 2023-07-24 DIAGNOSIS — Z23 Encounter for immunization: Secondary | ICD-10-CM | POA: Diagnosis not present

## 2023-07-24 NOTE — Patient Instructions (Addendum)
Order- DME [employer]- please change autopap range to 5-20  Order- flu vax senior

## 2023-07-30 ENCOUNTER — Telehealth: Payer: Medicare PPO | Admitting: Internal Medicine

## 2023-07-30 ENCOUNTER — Encounter: Payer: Self-pay | Admitting: Internal Medicine

## 2023-07-30 VITALS — Ht 60.0 in | Wt 105.0 lb

## 2023-07-30 DIAGNOSIS — J029 Acute pharyngitis, unspecified: Secondary | ICD-10-CM

## 2023-07-30 NOTE — Progress Notes (Signed)
Virtual Visit via Video Note  I connected with Kaitlyn Flowers on 07/30/23 at 12:30 PM EST by a video enabled telemedicine application and verified that I am speaking with the correct person using two identifiers. Location patient: home Location provider:work  office Persons participating in the virtual visit: patient, provider   Patient aware  of the limitations of evaluation and management by telemedicine and  availability of in person appointments. and agreed to proceed.   HPI: Kaitlyn Flowers presents for video visit   for 4-5 fdays of fatigue st and ear sx but without fever  congestion or cough  feels tired  Exposed to friend last week sicker with some same sx   no specific dx  otherwise .  Swallowing ok  no galnds exposure to children.   Cvid test negative a this time.     ROS: See pertinent positives and negatives per HPI.  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   Urinary tract infection    Wears hearing aid     Past Surgical History:  Procedure Laterality Date   ABDOMINAL EXPOSURE N/A 05/17/2019   Procedure: ABDOMINAL EXPOSURE;  Surgeon: Chuck Hint, MD;  Location: [facility] OR;  Service: Vascular;  Laterality: N/A;   ABDOMINAL HYSTERECTOMY  1973   1/2 of ovary left cyst on ovary   ANTERIOR LUMBAR FUSION N/A 05/17/2019   Procedure: Lumbar five Sacral one  Anterior lumbar interbody fusion;  Surgeon: Barnett Abu, MD;  Location: [facility] OR;  Service: Neurosurgery;  Laterality: N/A;   APPENDECTOMY     removed with hysterectomy   COLONOSCOPY     LUMBAR WOUND DEBRIDEMENT   12/06/2011   Procedure: LUMBAR WOUND DEBRIDEMENT;  Surgeon: Barnett Abu, MD;  Location: [facility];  Service: Neurosurgery;  Laterality: N/A;  Repair of Pseudomeningocele Lumbar wound   ROTATOR CUFF REPAIR     left    shoulder rotator culff  Left 2006   spinal cord tumor  1987   2012 october     Family History  Problem Relation Age of Onset   Stroke Mother        2010   Heart attack Father    Cancer Paternal Grandmother     Social History   Tobacco Use   Smoking status: Former    Current packs/day: 0.00    Average packs/day: 0.3 packs/day for 2.0 years (0.5 ttl pk-yrs)    Types: Cigarettes    Start date: 09/02/1965    Quit date: 09/03/1967    Years since quitting: 55.9   Smokeless tobacco: Never   Tobacco comments:    quit 48 years   Vaping Use   Vaping status: Never Used  Substance Use Topics   Alcohol use: Yes    Alcohol/week: 4.0 standard drinks of alcohol    Types: 4 Glasses of wine per week    Comment: 4 a week   Drug use: No      Current Outpatient Medications:  Cholecalciferol (VITAMIN D3) 125 MCG (5000 UT) TABS, Take 1 tablet by mouth daily., Disp: , Rfl:    cyanocobalamin 1000 MCG tablet, Take 1,000 mcg by mouth daily., Disp: , Rfl:    lamoTRIgine (LAMICTAL) 25 MG tablet, Take 2 tablets every evening, Disp: 180 tablet, Rfl: 3   MAGNESIUM PO, Take 1 tablet by mouth daily., Disp: , Rfl:    NONFORMULARY OR COMPOUNDED ITEM, Vitamin E vaginal cream 200u/ml.  One ml pv twice weekly.  Disp. 3 month supply., Disp: 24 each, Rfl: 3   Probiotic Product (PROBIOTIC DAILY PO), Take by mouth., Disp: , Rfl:    traMADol (ULTRAM) 50 MG tablet, SMARTSIG:1 Tablet(s) By Mouth Every 8-12 Hours PRN, Disp: , Rfl:    pyridoxine (B-6) 200 MG tablet, Take 200 mg by mouth daily. (Patient not taking: Reported on 07/30/2023), Disp: , Rfl:   EXAM: BP Readings from Last 3 Encounters:  07/24/23 118/64  06/20/23 124/60  05/19/23 (!) 93/58    VITALS per patient if  applicable:  GENERAL: alert, oriented, appears well and in no acute distress non toxic  nl speech and cognition   HEENT: atraumatic, conjunttiva clear, no obvious abnormalities on inspection of external nose and ears  NECK: normal movements of the head and neck  LUNGS: on inspection no signs of respiratory distress, breathing rate appears normal, no obvious gross SOB, gasping or wheezing  CV: no obvious cyanosis  PSYCH/NEURO: pleasant and cooperative, no obvious depression or anxiety, speech and thought processing grossly intact   ASSESSMENT AND PLAN:  Discussed the following assessment and plan:    ICD-10-CM   1. Sore throat  J02.9    w fatigue most likely viral etiology uri .    Appeasr like uncomplicated  viral infection with prodrome  Expect poss congestion and cough illness   Expect could last 7-10 days total or sometimes more  Reviewed alarm sx that she should seek care  in interm  At this time supportive measures rest  Ear sx are probably referred   pain Counseled. Agree with  canceling travel pans for now  Expectant management and discussion of plan and treatment with opportunity to ask questions and all were answered. The patient agreed with the plan and demonstrated an understanding of the instructions.   Advised to call back or seek an in-person evaluation if worsening  or having  further concerns  in interim. Return if symptoms worsen or fail to improve as expected.   Berniece Andreas, MD

## 2023-08-07 ENCOUNTER — Telehealth: Payer: Self-pay | Admitting: Internal Medicine

## 2023-08-07 DIAGNOSIS — G4733 Obstructive sleep apnea (adult) (pediatric): Secondary | ICD-10-CM

## 2023-08-07 NOTE — Telephone Encounter (Signed)
Patient states currently on CPAP machine. Having symptoms of sneezing, earache and sore throat in the morning. Pharmacy is [facility] [address]. Patient phone number is [phone].

## 2023-08-07 NOTE — Telephone Encounter (Signed)
Spoke with the pt  She is c/o sneezing, bilateral earache, sore throat- started a wk ago and only last for a couple of hours  She is concerned CPAP could be causing  I printed DL for your review  Please advise and route back to triage pool, thanks!

## 2023-08-07 NOTE — Telephone Encounter (Signed)
Please have her DME company change her autopap range to 4-10 and teach her how to adjust her humidifier for comfort, to prevent over drying. Hope this helps.

## 2023-08-07 NOTE — Telephone Encounter (Signed)
Patient is aware of below message/recommendations and voiced her understanding. Order placed to [employer] to change cpap settings.  Nothing further needed.

## 2023-08-12 ENCOUNTER — Emergency Department (HOSPITAL_COMMUNITY)
Admission: EM | Admit: 2023-08-12 | Discharge: 2023-08-13 | Disposition: A | Discharge status: Home / Self Care | Payer: Medicare PPO | Attending: Emergency Medicine | Admitting: Emergency Medicine

## 2023-08-12 ENCOUNTER — Emergency Department (HOSPITAL_COMMUNITY): Payer: Medicare PPO

## 2023-08-12 ENCOUNTER — Encounter (HOSPITAL_COMMUNITY): Payer: Self-pay

## 2023-08-12 DIAGNOSIS — R079 Chest pain, unspecified: Secondary | ICD-10-CM | POA: Diagnosis not present

## 2023-08-12 DIAGNOSIS — G609 Hereditary and idiopathic neuropathy, unspecified: Secondary | ICD-10-CM | POA: Insufficient documentation

## 2023-08-12 DIAGNOSIS — R200 Anesthesia of skin: Secondary | ICD-10-CM | POA: Insufficient documentation

## 2023-08-12 DIAGNOSIS — M25511 Pain in right shoulder: Secondary | ICD-10-CM | POA: Insufficient documentation

## 2023-08-12 DIAGNOSIS — G629 Polyneuropathy, unspecified: Secondary | ICD-10-CM | POA: Diagnosis not present

## 2023-08-12 DIAGNOSIS — R0789 Other chest pain: Secondary | ICD-10-CM | POA: Diagnosis not present

## 2023-08-12 LAB — BASIC METABOLIC PANEL
Anion gap: 9 (ref 5–15)
BUN: 29 mg/dL — ABNORMAL HIGH (ref 8–23)
CO2: 26 mmol/L (ref 22–32)
Calcium: 10 mg/dL (ref 8.9–10.3)
Chloride: 101 mmol/L (ref 98–111)
Creatinine, Ser: 0.93 mg/dL (ref 0.44–1.00)
GFR, Estimated: >60 mL/min (ref >60)
Glucose, Bld: 101 mg/dL — ABNORMAL HIGH (ref 70–99)
Potassium: 4.2 mmol/L (ref 3.5–5.1)
Sodium: 136 mmol/L (ref 135–145)

## 2023-08-12 LAB — TROPONIN I (HIGH SENSITIVITY)
Troponin I (High Sensitivity): 8 ng/L (ref <18)
Troponin I (High Sensitivity): 9 ng/L (ref <18)

## 2023-08-12 LAB — CBC
HCT: 31.3 % — ABNORMAL LOW (ref 36.0–46.0)
Hemoglobin: 10.2 g/dL — ABNORMAL LOW (ref 12.0–15.0)
MCH: 32.7 pg (ref 26.0–34.0)
MCHC: 32.6 g/dL (ref 30.0–36.0)
MCV: 100.3 fL — ABNORMAL HIGH (ref 80.0–100.0)
Platelets: 269 10*3/uL (ref 150–400)
RBC: 3.12 MIL/uL — ABNORMAL LOW (ref 3.87–5.11)
RDW: 14.1 % (ref 11.5–15.5)
WBC: 8.3 10*3/uL (ref 4.0–10.5)
nRBC: 0 % (ref 0.0–0.2)

## 2023-08-12 MED ORDER — DEXAMETHASONE SODIUM PHOSPHATE 10 MG/ML IJ SOLN
10.0000 mg | Freq: Once | INTRAMUSCULAR | Status: AC
Start: 2023-08-12 — End: 2023-08-12
  Administered 2023-08-12: 10 mg via INTRAMUSCULAR
  Filled 2023-08-12: qty 1

## 2023-08-12 MED ORDER — LIDOCAINE 5 % EX PTCH
1.0000 | MEDICATED_PATCH | CUTANEOUS | Status: DC
  Administered 2023-08-12: 1 via TRANSDERMAL
  Filled 2023-08-12: qty 1

## 2023-08-12 MED ORDER — OXYCODONE HCL 5 MG PO TABS
5.0000 mg | ORAL_TABLET | Freq: Once | ORAL | Status: AC
  Administered 2023-08-12: 5 mg via ORAL
  Filled 2023-08-12: qty 1

## 2023-08-12 NOTE — ED Triage Notes (Signed)
Pt has been having right sided chest pain for the last 2 to 3 days, its is only located on the right side with no radiation to the left. She also mentions that the pain has been migrating into her right arm as well. She is otherwise stable at this time, alert and oriented, and cooperative in triage.

## 2023-08-12 NOTE — ED Provider Triage Note (Signed)
Emergency Medicine Provider Triage Evaluation Note  Kaitlyn Flowers , a 82 y.o. female  was evaluated in triage.  Pt complains of chest pain for 3 days. Pt endorses right arm numbness with CP but denies shob, neck pain, headache, weakness, abd pain, NV. Denies previous clots, travel, hospitalization surgery. Denies cardiac history. Unsure of what exacerbates right sided chest pain.  Review of Systems  Positive:  Negative:   Physical Exam  BP (!) 142/68 (BP Location: Left Arm)   Pulse 82   Temp 97.7 F (36.5 C)   Resp 16   LMP  (LMP Unknown)   SpO2 100%  Gen:   Awake, no distress   Resp:  Normal effort  MSK:   Moves extremities without difficulty  Other:  No leg edema or calf tenderness  Medical Decision Making  Medically screening exam initiated at 3:16 PM.  Appropriate orders placed.  Kaitlyn Flowers was informed that the remainder of the evaluation will be completed by another provider, this initial triage assessment does not replace that evaluation, and the importance of remaining in the ED until their evaluation is complete.  Workup started, pt stable.   Kaitlyn Corrigan, PA-C 08/12/23 [phone]

## 2023-08-13 ENCOUNTER — Telehealth: Payer: Self-pay

## 2023-08-13 MED ORDER — OXYCODONE HCL 5 MG PO TABS: 5.0000 mg | ORAL_TABLET | Freq: Four times a day (QID) | ORAL | 0 refills | Status: DC | PRN

## 2023-08-13 NOTE — ED Provider Notes (Signed)
[place] EMERGENCY DEPARTMENT AT [facility] Provider Note   CSN: [id number] Arrival date & time: 08/12/23  1452     History Chief Complaint  Patient presents with   Chest Pain    Kaitlyn Flowers is a 82 y.o. female with history of scoliosis and chronic nerve pain presents emerged from today for evaluation of right shoulder pain.  Patient reports started to have pain at the aspect of her right scapula and her right shoulder then went to her forearm and then felt it in her axilla/very right lateral chest area.  Was intermittent for 3 days.  She also ports some decreased sensation on the ulnar aspect from her forearm to hand.  Denies any weakness.  Denies any coloration interpreter changes.  Denies any shortness of breath, nausea, vomiting, Donnell pain, fever, chills, mops this, and exogenous hormone use.  Pain is not changed with movement of her neck.  Has been trying her at home tramadol with some relief of symptoms.  She is allergic penicillin and ibuprofen.  Denies any tobacco, EtOH illicit drug use.   Chest Pain Associated symptoms: numbness   Associated symptoms: no abdominal pain, no cough, no fever, no nausea, no shortness of breath, no vomiting and no weakness       Home Medications Prior to Admission medications   Medication Sig Start Date End Date Taking? Authorizing Provider  Cholecalciferol (VITAMIN D3) 125 MCG (5000 UT) TABS Take 1 tablet by mouth daily.    [provider]  cyanocobalamin 1000 MCG tablet Take 1,000 mcg by mouth daily.    [provider]  lamoTRIgine (LAMICTAL) 25 MG tablet Take 2 tablets every evening 02/21/23   Van Clines, MD  MAGNESIUM PO Take 1 tablet by mouth daily.    [provider]  NONFORMULARY OR COMPOUNDED ITEM Vitamin E vaginal cream 200u/ml.  One ml pv twice weekly.  Disp. 3 month supply. 05/19/23   Selmer Dominion, NP  Probiotic Product (PROBIOTIC DAILY PO) Take by mouth.    [provider]  pyridoxine (B-6) 200 MG tablet Take 200 mg by mouth daily. Patient not taking: Reported on 07/30/2023    [provider]  traMADol (ULTRAM) 50 MG tablet SMARTSIG:1 Tablet(s) By Mouth Every 8-12 Hours PRN    [provider]      Allergies    Ibuprofen-famotidine, Fentanyl, Pregabalin, and Penicillins    Review of Systems   Review of Systems  Constitutional:  Negative for chills and fever.  Respiratory:  Negative for cough and shortness of breath.   Cardiovascular:  Positive for chest pain. Negative for leg swelling.  Gastrointestinal:  Negative for abdominal pain, nausea and vomiting.  Musculoskeletal:  Positive for arthralgias.  Neurological:  Positive for numbness. Negative for tremors and weakness.    Physical Exam Updated Vital Signs BP (!) 147/70   Pulse 72   Temp 98.1 F (36.7 C)   Resp 20   LMP  (LMP Unknown)   SpO2 100%  Physical Exam Vitals and nursing note reviewed.  Constitutional:      General: She is not in acute distress.    Appearance: She is not ill-appearing or toxic-appearing.  Cardiovascular:     Rate and Rhythm: Normal rate.     Pulses:          Radial pulses are 2+ on the right side and 2+ on the left side.       Dorsalis pedis pulses are 2+ on the right  side and 2+ on the left side.       Posterior tibial pulses are 2+ on the right side and 2+ on the left side.  Pulmonary:     Effort: Pulmonary effort is normal.     Breath sounds: Normal breath sounds.  Chest:     Chest wall: Tenderness present.       Comments: Tenderness to the marked area above.  No crepitus or any induration or fluctuance. Abdominal:     Palpations: Abdomen is soft.  Musculoskeletal:       Back:     Right lower leg: No tenderness. No edema.     Left lower leg: No tenderness. No edema.     Comments: Tenderness to the superior aspect of the right scapula.  No fluctuance, induration, or crepitus appreciated.  Has some pain with movement with rotation of  her shoulder.  Skin:    General: Skin is warm and dry.  Neurological:     Mental Status: She is alert.     Comments: Strength is equal in patient's upper bilateral extremities.  Equal grip strength.  Sensation reportedly intact and symmetric bilaterally.     ED Results / Procedures / Treatments   Labs (all labs ordered are listed, but only abnormal results are displayed) Labs Reviewed  BASIC METABOLIC PANEL - Abnormal; Notable for the following components:      Result Value   Glucose, Bld 101 (*)    BUN 29 (*)    All other components within normal limits  CBC - Abnormal; Notable for the following components:   RBC 3.12 (*)    Hemoglobin 10.2 (*)    HCT 31.3 (*)    MCV 100.3 (*)    All other components within normal limits  TROPONIN I (HIGH SENSITIVITY)  TROPONIN I (HIGH SENSITIVITY)    EKG EKG Interpretation Date/Time:  Tuesday August 12 2023 15:02:29 EST Ventricular Rate:  76 PR Interval:  140 QRS Duration:  80 QT Interval:  364 QTC Calculation: 409 R Axis:   38  Text Interpretation: Normal sinus rhythm Cannot rule out Anterior infarct , age undetermined Abnormal ECG When compared with ECG of 09-Aug-2016 12:50, PREVIOUS ECG IS PRESENT No significant change since last tracing Confirmed by Fulton Reek [phone]) on 08/12/2023 10:32:16 PM  Radiology DG Chest 2 View  Result Date: 08/12/2023 CLINICAL DATA:  Chest pain. EXAM: CHEST - 2 VIEW COMPARISON:  09/18/2019. FINDINGS: There is a stable 5 mm calcified granuloma overlying the left lower lung zone, laterally. There are also several additional sub 5 mm calcified granulomas and at least 2 calcifications with central lucency in the left retrocardiac region, unchanged since the prior study and favored benign in etiology. Bilateral lung fields are otherwise clear. No acute consolidation or lung collapse. Bilateral costophrenic angles are clear. Stable cardio-mediastinal silhouette. No acute osseous abnormalities. The soft  tissues are within normal limits. IMPRESSION: *No active cardiopulmonary disease. Electronically Signed   By: Jules Schick M.D.   On: 08/12/2023 16:44    Procedures Procedures   Medications Ordered in ED Medications  lidocaine (LIDODERM) 5 % 1 patch (1 patch Transdermal Patch Applied 08/12/23 2348)  oxyCODONE (Oxy IR/ROXICODONE) immediate release tablet 5 mg (5 mg Oral Given 08/12/23 2348)  dexamethasone (DECADRON) injection 10 mg (10 mg Intramuscular Given 08/12/23 2348)    ED Course/ Medical Decision Making/ A&P   Medical Decision Making Amount and/or Complexity of Data Reviewed Labs: ordered. Radiology: ordered.  Risk Prescription drug management.  82 y.o. female presents to the ER for evaluation of right shoulder/arm pain, right sided chest pain. Differential diagnosis includes but is not limited to ACS, pericarditis, myocarditis, aortic dissection, PE, pneumothorax, esophageal spasm or rupture, chronic angina, pneumonia, bronchitis, GERD, reflux/PUD, biliary disease, pancreatitis, costochondritis, anxiety, MSK, neuropathy. Vital signs mildly elevated BP, otherwise unremarkable. Physical exam as noted above.   I independently reviewed and interpreted the patient's labs. Troponin at 8 with repeat at 9. BMP shows mildly elevated glucose at 101. Mildly elevated BUN at 29, otherwise unremarkable. CBC shows mild anemia, but appears to be around baseline.   CXR shows No active cardiopulmonary disease. Per radiologist's interpretation.    EKG reviewed and interpreted by my attending and read as Normal sinus rhythm Cannot rule out Anterior infarct , age undetermined Abnormal ECG When compared with ECG of 09-Aug-2016 12:50, PREVIOUS ECG IS PRESENT No significant change since last tracing.  Patient having pain and tenderness of the right superior scapula.  Shorts that started on her shoulder and in her forearm.  Reports that in her inferior axilla area/underneath her right breast.   She denies any central chest pain.  Not worse on exertion.  Denies any shortness of breath.  No nausea, vomiting, fever, chills, hemoptysis, or any palpitations.  Denies any exogenous hormone use.  She also reports he is having intermittent decrease in sensation in her lower arm on the ulnar aspect off-and-on for the past 3 days as well.  I think this is likely musculoskeletal or some radiculopathy, likely brachial.  She does not have any weakness.  She reports incisions intact currently.  Equal strength and grip strength with and without resistance.  Palpable radial pulses that are symmetric.  Coloration and size appear and feel symmetric bilaterally.  My attending assessed at bedside and recommends increasing her pain medication and discharge with follow-up.  Workup does not consistent with any ACS.  Given her symptoms, this sounds more like radiculopathy comparison to any ACS.  We discussed the results of the labs/imaging. The plan new narcotic, d/c Tramadol. Recommended follow up with her provider. We discussed strict return precautions and red flag symptoms. The patient verbalized their understanding and agrees to the plan. The patient is stable and being discharged home in good condition.  Portions of this report may have been transcribed using voice recognition software. Every effort was made to ensure accuracy; however, inadvertent computerized transcription errors may be present.   I discussed this case with my attending physician who cosigned this note including patient's presenting symptoms, physical exam, and planned diagnostics and interventions. Attending physician stated agreement with plan or made changes to plan which were implemented.   Attending physician assessed patient at bedside.  Final Clinical Impression(s) / ED Diagnoses Final diagnoses:  Acute pain of right shoulder  Neuropathy    Rx / DC Orders ED Discharge Orders          Ordered    oxyCODONE (ROXICODONE) 5 MG  immediate release tablet  Every 6 hours PRN        08/13/23 0046              Achille Rich, PA-C 08/15/23 1931    Blinda Leatherwood Canary Brim, MD 08/21/23 [phone]

## 2023-08-13 NOTE — Discharge Instructions (Addendum)
You were seen in the ER today for evaluation for your shoulder pain. I have changed your narcotic pain medication to roxicodone from tramadol. Do not take the Tramadol with the Roxicodone. You can also take 1000mg  of Tylenol every 6 hours with this medication. Please be careful because the medication can make you sleepy and constipated. Follow up with your PCP/neurosurgeon regarding your pain. If you have any concerns, new or worsneing symptoms, please return to the nearest ER for re-evaluation.   Contact a health care provider if: You have new signs or symptoms of peripheral neuropathy. You are struggling emotionally from dealing with peripheral neuropathy. Your pain is not well controlled. Get help right away if: You have an injury or infection that is not healing normally. You develop new weakness in an arm or leg. You have fallen or do so frequently.

## 2023-08-13 NOTE — Telephone Encounter (Signed)
Received inbound call from patient who reports she lost her 50mg  tablet of Tramadol while at [facility] ED yesterday or today. Patient reports she was in room# 34 and in the ED waiting area. This RNCM left voicemail message for Lowella Bandy the RN Charge to determine if anyone has turned in patient's medications.   Awaiting a call back from [place].

## 2023-08-16 DIAGNOSIS — G4733 Obstructive sleep apnea (adult) (pediatric): Secondary | ICD-10-CM | POA: Diagnosis not present

## 2023-08-18 DIAGNOSIS — M25511 Pain in right shoulder: Secondary | ICD-10-CM | POA: Diagnosis not present

## 2023-08-18 DIAGNOSIS — M542 Cervicalgia: Secondary | ICD-10-CM | POA: Diagnosis not present

## 2023-08-20 ENCOUNTER — Encounter: Payer: Self-pay | Admitting: Obstetrics and Gynecology

## 2023-08-20 ENCOUNTER — Ambulatory Visit: Payer: Medicare PPO | Admitting: Obstetrics and Gynecology

## 2023-08-20 VITALS — BP 136/75 | HR 69

## 2023-08-20 DIAGNOSIS — N393 Stress incontinence (female) (male): Secondary | ICD-10-CM

## 2023-08-20 DIAGNOSIS — N812 Incomplete uterovaginal prolapse: Secondary | ICD-10-CM | POA: Diagnosis not present

## 2023-08-20 DIAGNOSIS — N362 Urethral caruncle: Secondary | ICD-10-CM

## 2023-08-20 DIAGNOSIS — N8110 Cystocele, unspecified: Secondary | ICD-10-CM

## 2023-08-20 NOTE — Progress Notes (Signed)
[facility]   Subjective:     Chief Complaint:  Chief Complaint  Patient presents with   Pessary Check    Kaitlyn Flowers is a 82 y.o. female is here due to having leaking with pessary.   History of Present Illness: Kaitlyn Flowers is a 82 y.o. female with stage II pelvic organ prolapse and stress incontinence who presents for a pessary check. She is using a size #2 incontinence ring pessary. Patient reports she has been having significant leakage after urination. She reports she will go to pee and then stand up and leak. She is using Vitamin E cream. She denies vaginal bleeding.  Past Medical History: Patient  has a past medical history of Anemia, Arthritis, Back pain, Cancer (HCC), Depression, Leg weakness, Osteopenia, PONV (postoperative nausea and vomiting), Right leg pain, Seizures (HCC), Spinal cord tumor, Urinary tract infection, and Wears hearing aid.   Past Surgical History: She  has a past surgical history that includes Abdominal hysterectomy (1973); shoulder rotator culff  (Left, 2006); spinal cord tumor (1987); Appendectomy; Rotator cuff repair; Lumbar wound debridement (12/06/2011); Colonoscopy; Anterior lumbar fusion (N/A, 05/17/2019); and Abdominal exposure (N/A, 05/17/2019).   Medications: She has a current medication list which includes the following prescription(s): vitamin d3, cyanocobalamin, lamotrigine, magnesium, NONFORMULARY OR COMPOUNDED ITEM, oxycodone, probiotic product, and pyridoxine.   Allergies: Patient is allergic to ibuprofen-famotidine, fentanyl, pregabalin, and penicillins.   Social History: Patient  reports that she quit smoking about 56 years ago. Her smoking use included cigarettes. She started smoking about 58 years ago. She has a 0.5 pack-year smoking history. She has never used smokeless tobacco. She reports current alcohol use of about 4.0 standard drinks of alcohol per week. She reports that she does not use drugs.       Objective:    Physical Exam: BP 136/75   Pulse 69   LMP  (LMP Unknown)  Gen: No apparent distress, A&O x 3. Detailed Urogynecologic Evaluation:  Pelvic Exam: Normal external female genitalia; Bartholin's and Skene's glands normal in appearance; urethral meatus with caruncle, no urethral masses or discharge. The pessary was noted to be in place but seemed to be pushing heavily on the urethra. It was removed and cleaned. Speculum exam revealed no lesions in the vagina. The pessary was changed to a size #2 incontinence dish (Lot [id number]). This pessary has a smaller knob and the silicon is less rigid and seemed more comfortable to patient.     Assessment/Plan:    Assessment: Kaitlyn Flowers is a 82 y.o. with stage II pelvic organ prolapse and stress incontinence here for a pessary check. She is doing well.  Plan: She will keep the pessary in place until next visit. She will continue to use vitamin E oil. She will follow-up in 4 months for a pessary check or sooner as needed.   Encouraged patient to call if she has any concerns or if this pessary is not comfortable.

## 2023-08-21 ENCOUNTER — Telehealth (INDEPENDENT_AMBULATORY_CARE_PROVIDER_SITE_OTHER): Payer: Medicare PPO | Admitting: Internal Medicine

## 2023-08-21 ENCOUNTER — Encounter: Payer: Self-pay | Admitting: Internal Medicine

## 2023-08-21 ENCOUNTER — Other Ambulatory Visit: Payer: Self-pay | Admitting: Internal Medicine

## 2023-08-21 DIAGNOSIS — D649 Anemia, unspecified: Secondary | ICD-10-CM

## 2023-08-21 DIAGNOSIS — R718 Other abnormality of red blood cells: Secondary | ICD-10-CM

## 2023-08-21 NOTE — Progress Notes (Signed)
Virtual Visit via Video Note  I connected with Kaitlyn Flowers on 08/21/23 at  9:00 AM EST by a video enabled telemedicine application and verified that I am speaking with the correct person using two identifiers. Location patient: home Location provider:work  office Persons participating in the virtual visit: patient, provider   Patient aware  of the limitations of evaluation and management by telemedicine and  availability of in person appointments. and agreed to proceed.   HPI: Kaitlyn Flowers presents for video visit Had ed visit for atypical cp  12 10 given oxycodone doing better  dx per Dr Eulah Pont shoulder tear and neck issue   injection helped some and doing ok  However labs were abnormal and would like interpretation and clariification Lab showed  on going chronic anemia  Mild elevated bun  ? Need fu   ROS: See pertinent positives and negatives per HPI.  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   Urinary tract infection    Wears hearing aid     Past Surgical History:  Procedure Laterality Date   ABDOMINAL EXPOSURE N/A 05/17/2019   Procedure: ABDOMINAL EXPOSURE;  Surgeon: Chuck Hint, MD;  Location: [facility] OR;  Service: Vascular;  Laterality: N/A;   ABDOMINAL HYSTERECTOMY  1973   1/2 of ovary left cyst on ovary   ANTERIOR LUMBAR FUSION N/A 05/17/2019   Procedure: Lumbar five Sacral one  Anterior lumbar interbody fusion;  Surgeon: Barnett Abu, MD;  Location: [facility] OR;  Service: Neurosurgery;  Laterality: N/A;   APPENDECTOMY     removed with hysterectomy   COLONOSCOPY      LUMBAR WOUND DEBRIDEMENT  12/06/2011   Procedure: LUMBAR WOUND DEBRIDEMENT;  Surgeon: Barnett Abu, MD;  Location: [facility];  Service: Neurosurgery;  Laterality: N/A;  Repair of Pseudomeningocele Lumbar wound   ROTATOR CUFF REPAIR     left    shoulder rotator culff  Left 2006   spinal cord tumor  1987   2012 october     Family History  Problem Relation Age of Onset   Stroke Mother        2010   Heart attack Father    Cancer Paternal Grandmother     Social History   Tobacco Use   Smoking status: Former    Current packs/day: 0.00    Average packs/day: 0.3 packs/day for 2.0 years (0.5 ttl pk-yrs)    Types: Cigarettes    Start date: 09/02/1965    Quit date: 09/03/1967    Years since quitting: 56.0   Smokeless tobacco: Never   Tobacco comments:    quit 48 years   Vaping Use   Vaping status: Never Used  Substance Use Topics   Alcohol use: Yes    Alcohol/week: 4.0 standard drinks of alcohol    Types: 4 Glasses of wine per week    Comment: 4 a week   Drug use: No      Current Outpatient Medications:    Cholecalciferol (  VITAMIN D3) 125 MCG (5000 UT) TABS, Take 1 tablet by mouth daily., Disp: , Rfl:    cyanocobalamin 1000 MCG tablet, Take 1,000 mcg by mouth daily., Disp: , Rfl:    lamoTRIgine (LAMICTAL) 25 MG tablet, Take 2 tablets every evening, Disp: 180 tablet, Rfl: 3   MAGNESIUM PO, Take 1 tablet by mouth daily., Disp: , Rfl:    NONFORMULARY OR COMPOUNDED ITEM, Vitamin E vaginal cream 200u/ml.  One ml pv twice weekly.  Disp. 3 month supply., Disp: 24 each, Rfl: 3   oxyCODONE (ROXICODONE) 5 MG immediate release tablet, Take 1 tablet (5 mg total) by mouth every 6 (six) hours as needed for severe pain (pain score 7-10)., Disp: 10 tablet, Rfl: 0   Probiotic Product (PROBIOTIC DAILY PO), Take by mouth., Disp: , Rfl:    pyridoxine (B-6) 200 MG tablet, Take 200 mg by mouth daily., Disp: , Rfl:   EXAM: BP Readings from Last 3 Encounters:  08/20/23 136/75  08/12/23 (!) 147/70   07/24/23 118/64    VITALS per patient if applicable:  GENERAL: alert, oriented, appears well and in no acute distress  HEENT: atraumatic, conjunttiva clear, no obvious abnormalities on inspection of external nose and ears  NECK: normal movements of the head and neck  LUNGS: on inspection no signs of respiratory distress, breathing rate appears normal, no obvious gross SOB, gasping or wheezing  CV: no obvious cyanosis  MS: moves all visible extremities without noticeable abnormality  PSYCH/NEURO: pleasant and cooperative, no obvious depression or anxiety, speech and thought processing grossly intact Lab Results  Component Value Date   WBC 8.3 08/12/2023   HGB 10.2 (L) 08/12/2023   HCT 31.3 (L) 08/12/2023   PLT 269 08/12/2023   GLUCOSE 101 (H) 08/12/2023   CHOL 224 (H) 03/22/2022   TRIG 60.0 03/22/2022   HDL 88.70 03/22/2022   LDLDIRECT 110.1 11/27/2010   LDLCALC 124 (H) 03/22/2022   ALT 16 09/13/2022   AST 23 09/13/2022   NA 136 08/12/2023   K 4.2 08/12/2023   CL 101 08/12/2023   CREATININE 0.93 08/12/2023   BUN 29 (H) 08/12/2023   CO2 26 08/12/2023   TSH 1.42 03/22/2022   INR 1.01 03/25/2016   HGBA1C 5.7 03/22/2022   Nl irons tudies in remote past  as normal folate and b12  ranges Ife nl 2013  neg celiac  screen  ASSESSMENT AND PLAN:  Discussed the following assessment and plan:    ICD-10-CM   1. Anemia, unspecified type  D64.9    on going chronic undefined . update screening studies .    2. Elevated MCV borderline  R71.8      No  obv cause of  anemia slightly worse  than baseline    No bleeding renal failure gfr borderline but ok . Will relook with lab iron studies, mm screen, b12 folate retic etc .   We may  have  hematology give opinion as appropriate  Reassuring that wbc and plt have been normal range.  Counseled.   Expectant management and discussion of plan and treatment with opportunity to ask questions and all were answered. The patient agreed  with the plan and demonstrated an understanding of the instructions.  record review  plan discussion   hx 30 minutes  Advised to call back or seek an in-person evaluation if worsening  or having  further concerns  in interim. Return for depending on results.    Berniece Andreas, MD

## 2023-08-21 NOTE — Progress Notes (Signed)
Future orders  for anemia reassessment

## 2023-08-24 NOTE — Progress Notes (Unsigned)
04/21/23- 82 yoF for sleep evaluation courtesy of Dr Fabian Sharp with concerns of snoring, daytime tiredness, witnessed apnea when son visited. Medical problem list includes Autonomic Dysfunction, Epilepsy/ Lamictal, R leg weakness, Osteoporosis, Spondylolisthesis,  Epworth score 8 Body eight today- 103 lbs Son tells her of loud snoring and witnessed apneas. She is concerned about daytime tiredness in recent months.  Not aware of complex parasomnias. Hx ENT surgery- tonsils. Denies heart/ lung problems. No sleep meds. 1 cup coffee.  Bedtime 10:30-11:30, up x1, then up 8AM. She also is concerned about easier DOE in last 2 months w/o cough or wheeze, and wonders if this is from slowly progressive scoliosis. She blames scoliosis on extensive XRT at age 4 for spinal tumor. She noticed this DOE at first if she didn't exercise regularly, and it would resolve as she became more active. Now it seems to persist.  07/24/23- 82 yoF for sleep evaluation courtesy of Dr Fabian Sharp with concerns of snoring, daytime tiredness, witnessed apnea when son visited. Medical problem list includes Autonomic Dysfunction, Epilepsy/ Lamictal, R leg weakness, Osteoporosis, Spondylolisthesis,  HST 05/01/23- AHI 34.8/hr, snoring with desaturation to 57%/ 73 minutes</= 88%, body weight 103 lbs CPAP auto 5-12/ [employer]   ordered 06/05/23 Download compliance -97%, AHI 9.8/hr Body weight today [employer] manager reported that patient has been adjusting pressure on own. PFT 07/03/23- mild Diffusion defect Still some daytime tiredness. Feels she sleeps soundly- more comfortable with nasal pillows. Download reviewed. We will extend auto range to 4-20 to see if we can improve control. Discussed PFT. Some days notes more DOE than others, w/o wheeze/ cough. I hear faint murmur she wan't aware of- likely trivial.  08/25/23- 82 yoF followed for OSA, Dyspnea on Exertion, complicated by Autonomic Dysfunction, Epilepsy/ Lamictal, R leg weakness,  Osteoporosis, Spondylolisthesis,  Medical problem list includes Autonomic Dysfunction, Epilepsy/ Lamictal, R leg weakness, Osteoporosis, Spondylolisthesis,  HST 05/01/23- AHI 34.8/hr, snoring with desaturation to 57%/ 73 minutes</= 88%, body weight 103 lbs CPAP auto 4-10/ [facility]   ordered 06/05/23 Download compliance- 90%, AHI 1.1/hr Body Weight today-107 lbs   CXR 08/12/23 IMPRESSION: *No active cardiopulmonary disease.     ROS-see HPI   + = positive Constitutional:    weight loss, night sweats, fevers, chills, fatigue, lassitude. HEENT:    headaches, difficulty swallowing, tooth/dental problems, sore throat,       sneezing, itching, ear ache, nasal congestion, post nasal drip, snoring CV:    chest pain, orthopnea, PND, swelling in lower extremities, anasarca,                                   dizziness, palpitations Resp:   shortness of breath with exertion or at rest.                productive cough,   non-productive cough, coughing up of blood.              change in color of mucus.  wheezing.   Skin:    rash or lesions. GI:  No-   heartburn, indigestion, abdominal pain, nausea, vomiting, diarrhea,                 change in bowel habits, loss of appetite GU: dysuria, change in color of urine, no urgency or frequency.   flank pain. MS:   joint pain, stiffness, decreased range of motion, back pain. Neuro-     nothing unusual Psych:  change  in mood or affect.  depression or anxiety.   memory loss.  OBJ- Physical Exam    +petite General- Alert, Oriented, Affect-appropriate, Distress- none acute Skin- rash-none, lesions- none, excoriation- none Lymphadenopathy- none Head- atraumatic            Eyes- Gross vision intact, PERRLA, conjunctivae and secretions clear            Ears- Hearing, canals-normal            Nose- Clear, no-Septal dev, mucus, polyps, erosion, perforation             Throat- Mallampati III-IV , mucosa clear , drainage- none, tonsils-absent, +teeth, Neck-  flexible , trachea midline, no stridor , thyroid nl, carotid no bruit Chest - symmetrical excursion , unlabored           Heart/CV- RRR , no murmur , no gallop  , no rub, nl s1 s2                           - JVD- none , edema- none, stasis changes- none, varices- none           Lung- clear to P&A, wheeze- none, cough- none , dullness-none, rub- none           Chest wall- +scoliosis- had to stand for comfort. Abd-  Br/ Gen/ Rectal- Not done, not indicated Extrem- cyanosis- none, clubbing, none, atrophy- none, strength- nl Neuro- grossly intact to observation

## 2023-08-25 ENCOUNTER — Encounter: Payer: Self-pay | Admitting: Internal Medicine

## 2023-08-25 ENCOUNTER — Ambulatory Visit: Payer: Medicare PPO | Admitting: Internal Medicine

## 2023-08-25 VITALS — BP 110/68 | HR 78 | Ht 60.0 in | Wt 107.2 lb

## 2023-08-25 DIAGNOSIS — G4733 Obstructive sleep apnea (adult) (pediatric): Secondary | ICD-10-CM

## 2023-08-25 NOTE — Patient Instructions (Signed)
Order- DME [employer]- please continue CPAP auto 4-10, mask of choice, humidifier, supplies, AirView/ card. She may need help with humidifier.  You can adjust the humidifier up and down yourself. Directions are in the booklet, or you can ask [employer].  We mentioned trying otc nasal saline GEL- any brand, as needed for dry nose.  Please cal if we can help. Hope the holidays and winter are kind to you !!

## 2023-08-27 ENCOUNTER — Other Ambulatory Visit: Payer: Self-pay | Admitting: Internal Medicine

## 2023-08-27 ENCOUNTER — Encounter: Payer: Self-pay | Admitting: Internal Medicine

## 2023-08-28 ENCOUNTER — Encounter: Payer: Self-pay | Admitting: Obstetrics and Gynecology

## 2023-08-28 ENCOUNTER — Ambulatory Visit: Payer: Medicare PPO | Admitting: Obstetrics and Gynecology

## 2023-08-28 VITALS — BP 131/62 | HR 71

## 2023-08-28 DIAGNOSIS — N8110 Cystocele, unspecified: Secondary | ICD-10-CM

## 2023-08-28 DIAGNOSIS — N393 Stress incontinence (female) (male): Secondary | ICD-10-CM

## 2023-08-28 NOTE — Progress Notes (Signed)
[facility] Return Visit  SUBJECTIVE  History of Present Illness: Kaitlyn Flowers is a 82 y.o. female seen in follow-up for SUI. Patient reports her leaking has increased despite the new pessary. She reports she urinates and then goes to stand and has an increase in leakage.     Past Medical History: Patient  has a past medical history of Anemia, Arthritis, Back pain, Cancer (HCC), Depression, Leg weakness, Osteopenia, PONV (postoperative nausea and vomiting), Right leg pain, Rotator cuff tear, Seizures (HCC), Spinal cord tumor, Urinary tract infection, and Wears hearing aid.   Past Surgical History: She  has a past surgical history that includes Abdominal hysterectomy (1973); shoulder rotator culff  (Left, 2006); spinal cord tumor (1987); Appendectomy; Rotator cuff repair; Lumbar wound debridement (12/06/2011); Colonoscopy; Anterior lumbar fusion (N/A, 05/17/2019); and Abdominal exposure (N/A, 05/17/2019).   Medications: She has a current medication list which includes the following prescription(s): vitamin d3, cyanocobalamin, lamotrigine, magnesium, NONFORMULARY OR COMPOUNDED ITEM, probiotic product, and pyridoxine.   Allergies: Patient is allergic to ibuprofen-famotidine, fentanyl, pregabalin, and penicillins.   Social History: Patient  reports that she quit smoking about 56 years ago. Her smoking use included cigarettes. She started smoking about 58 years ago. She has a 0.5 pack-year smoking history. She has never used smokeless tobacco. She reports current alcohol use of about 4.0 standard drinks of alcohol per week. She reports that she does not use drugs.     OBJECTIVE     Physical Exam: Vitals:   08/28/23 1216  BP: 131/62  Pulse: 71   Gen: No apparent distress, A&O x 3.  Detailed Urogynecologic Evaluation:  Pessary removed and left out. Patient would like to see how her leakage is without pessary in place at all.    POP-Q  -1                                             Aa   -1                                           Ba  -7                                              C   2.5                                            Gh  3                                            Pb  8                                            tvl   -2  Ap  -2                                            Bp                                                 D       ASSESSMENT AND PLAN    [name] is a 82 y.o. with:  1. Prolapse of anterior vaginal wall   2. SUI (stress urinary incontinence, female)    We discussed her anterior vaginal prolapse and that this may be dropping further and affecting her leakage. She is not currently interested in prolapse repair and is more concerned about the loss of urine.  For patient's SUI we discussed doing urethral bulking vs. Surgical sling with anterior repair. She is more interested in starting with urethral bulking. Will plan to have patient return for urethral bulking. IU[facility] handout provided on Urethral bulking and Bulkamid pamphlet provided.   As her pessary was left out, patient may call if she wants it replaced or if she changes her mind on the plan of care.    Selmer Dominion, NP

## 2023-08-29 DIAGNOSIS — G4733 Obstructive sleep apnea (adult) (pediatric): Secondary | ICD-10-CM | POA: Diagnosis not present

## 2023-09-01 DIAGNOSIS — M25531 Pain in right wrist: Secondary | ICD-10-CM | POA: Diagnosis not present

## 2023-09-04 DIAGNOSIS — S46012D Strain of muscle(s) and tendon(s) of the rotator cuff of left shoulder, subsequent encounter: Secondary | ICD-10-CM | POA: Diagnosis not present

## 2023-09-04 DIAGNOSIS — M50023 Cervical disc disorder at C6-C7 level with myelopathy: Secondary | ICD-10-CM | POA: Diagnosis not present

## 2023-09-08 ENCOUNTER — Ambulatory Visit: Payer: Medicare PPO | Admitting: Obstetrics and Gynecology

## 2023-09-08 ENCOUNTER — Encounter: Payer: Self-pay | Admitting: Obstetrics and Gynecology

## 2023-09-08 VITALS — BP 151/73 | HR 80

## 2023-09-08 DIAGNOSIS — N393 Stress incontinence (female) (male): Secondary | ICD-10-CM

## 2023-09-08 DIAGNOSIS — N8110 Cystocele, unspecified: Secondary | ICD-10-CM

## 2023-09-08 DIAGNOSIS — N812 Incomplete uterovaginal prolapse: Secondary | ICD-10-CM | POA: Diagnosis not present

## 2023-09-08 DIAGNOSIS — R339 Retention of urine, unspecified: Secondary | ICD-10-CM

## 2023-09-08 MED ORDER — SULFAMETHOXAZOLE-TRIMETHOPRIM 800-160 MG PO TABS: 1.0000 | ORAL_TABLET | Freq: Two times a day (BID) | ORAL | 0 refills | Status: AC

## 2023-09-08 NOTE — Progress Notes (Signed)
 [facility]   Subjective:     Chief Complaint:  Chief Complaint  Patient presents with   Pessary placement    Kaitlyn Flowers is a 83 y.o. female is here for for placement.   History of Present Illness: Kaitlyn Flowers is a 83 y.o. female with stage II pelvic organ prolapse and stress incontinence who presents for a pessary check. She is using a size #2 incontinence dish pessary but at last visit opted to keep the pessary out due to the leaking. She reportedly did okay for a a week and this this past Friday had retention in which she could not empty due to the prolapse. She reports she had to catheterize herself with old catheters to get out her urine. She reports she washed them and was able to get out 8-12oz of urine each time.      Past Medical History: Patient  has a past medical history of Anemia, Arthritis, Back pain, Cancer (HCC), Depression, Leg weakness, Osteopenia, PONV (postoperative nausea and vomiting), Right leg pain, Rotator cuff tear, Seizures (HCC), Spinal cord tumor, Urinary tract infection, and Wears hearing aid.   Past Surgical History: She  has a past surgical history that includes Abdominal hysterectomy (1973); shoulder rotator culff  (Left, 2006); spinal cord tumor (1987); Appendectomy; Rotator cuff repair; Lumbar wound debridement (12/06/2011); Colonoscopy; Anterior lumbar fusion (N/A, 05/17/2019); and Abdominal exposure (N/A, 05/17/2019).   Medications: She has a current medication list which includes the following prescription(s): vitamin d3, cyanocobalamin , lamotrigine , magnesium, NONFORMULARY OR COMPOUNDED ITEM, probiotic product, pyridoxine, sulfamethoxazole -trimethoprim , and tramadol .   Allergies: Patient is allergic to ibuprofen -famotidine , fentanyl , pregabalin , and penicillins.   Social History: Patient  reports that she quit smoking about 56 years ago. Her smoking use included cigarettes. She started smoking about 58 years ago. She has a  0.5 pack-year smoking history. She has never used smokeless tobacco. She reports current alcohol use of about 4.0 standard drinks of alcohol per week. She reports that she does not use drugs.      Objective:    Physical Exam: BP (!) 145/76   Pulse 80   LMP  (LMP Unknown)  Gen: No apparent distress, A&O x 3. Detailed Urogynecologic Evaluation:  Pelvic Exam: Normal external female genitalia; Bartholin's and Skene's glands normal in appearance; urethral meatus with caruncle, no urethral masses or discharge. The pessary was replaced with her #2 incontinence dish.  It was comfortable to the patient and fit well.    Assessment/Plan:    Assessment: Kaitlyn Flowers is a 83 y.o. with stage II pelvic organ prolapse and stress incontinence here for a pessary check. She is doing well.  Plan: She will keep the pessary in place until next visit. She will continue to use Vitamin E cream. She will follow-up in 3 months for a pessary check or sooner as needed.   Short course of Bactrim  given to patient due to the repeated use of old catheters and risk for UTI. Also patient was given some new catheters to have at home in case of an emergency. Patient is also still planning to undergo urethral bulking for the SUI leakage but does not desire surgical intervention for her prolapse and will keep utilizing the pessary.    All questions were answered.

## 2023-09-10 ENCOUNTER — Encounter: Payer: Self-pay | Admitting: Internal Medicine

## 2023-09-10 DIAGNOSIS — S46012D Strain of muscle(s) and tendon(s) of the rotator cuff of left shoulder, subsequent encounter: Secondary | ICD-10-CM | POA: Diagnosis not present

## 2023-09-10 DIAGNOSIS — M50023 Cervical disc disorder at C6-C7 level with myelopathy: Secondary | ICD-10-CM | POA: Diagnosis not present

## 2023-09-10 NOTE — Assessment & Plan Note (Signed)
 Plan- continue CPAP. Sleep hygiene. Short naps when needed.Gentle use of caffeine.

## 2023-09-10 NOTE — Assessment & Plan Note (Signed)
 Benefits from CPAP Plan- continue CPAP. Change auto range to 5-20.

## 2023-09-15 DIAGNOSIS — M5412 Radiculopathy, cervical region: Secondary | ICD-10-CM | POA: Diagnosis not present

## 2023-09-16 DIAGNOSIS — G4733 Obstructive sleep apnea (adult) (pediatric): Secondary | ICD-10-CM | POA: Diagnosis not present

## 2023-09-18 ENCOUNTER — Ambulatory Visit: Payer: Medicare PPO | Admitting: Neurology

## 2023-09-22 NOTE — Progress Notes (Signed)
Precertification check for Urethral Bulking Insurance: [employer] contacted through provider portal CPT code: 47425, [phone] Dx code: N39.3 (No)  Prior auth needed  Authorization/Referral Not Required Member ID [id number] Line of Business [employer] Date of Service 2023-10-23 Message   Procedure Code 1 18841  [employer] Record Number [id number] Status NO AUTH/REFERRAL REQUIRED  Procedure Code 2 L8606  [employer] Record Number [id number] Status NO AUTH/REFERRAL REQUIRED   Scanned into chart

## 2023-09-30 DIAGNOSIS — M542 Cervicalgia: Secondary | ICD-10-CM | POA: Diagnosis not present

## 2023-09-30 DIAGNOSIS — M5032 Other cervical disc degeneration, mid-cervical region, unspecified level: Secondary | ICD-10-CM | POA: Diagnosis not present

## 2023-09-30 DIAGNOSIS — M9903 Segmental and somatic dysfunction of lumbar region: Secondary | ICD-10-CM | POA: Diagnosis not present

## 2023-09-30 DIAGNOSIS — M4125 Other idiopathic scoliosis, thoracolumbar region: Secondary | ICD-10-CM | POA: Diagnosis not present

## 2023-09-30 DIAGNOSIS — M9902 Segmental and somatic dysfunction of thoracic region: Secondary | ICD-10-CM | POA: Diagnosis not present

## 2023-09-30 DIAGNOSIS — M5137 Other intervertebral disc degeneration, lumbosacral region with discogenic back pain only: Secondary | ICD-10-CM | POA: Diagnosis not present

## 2023-09-30 DIAGNOSIS — M9901 Segmental and somatic dysfunction of cervical region: Secondary | ICD-10-CM | POA: Diagnosis not present

## 2023-09-30 DIAGNOSIS — S46012D Strain of muscle(s) and tendon(s) of the rotator cuff of left shoulder, subsequent encounter: Secondary | ICD-10-CM | POA: Diagnosis not present

## 2023-09-30 DIAGNOSIS — M50023 Cervical disc disorder at C6-C7 level with myelopathy: Secondary | ICD-10-CM | POA: Diagnosis not present

## 2023-10-02 ENCOUNTER — Telehealth: Payer: Self-pay | Admitting: Obstetrics and Gynecology

## 2023-10-02 NOTE — Telephone Encounter (Signed)
Pt called in this morning saying she is electing to go surgery route instead of doing bulking.  She wanted to consult with you first.  Do we need to cancel procedure and book consult in the office with you?

## 2023-10-04 ENCOUNTER — Other Ambulatory Visit: Payer: Self-pay | Admitting: Internal Medicine

## 2023-10-05 DIAGNOSIS — G4733 Obstructive sleep apnea (adult) (pediatric): Secondary | ICD-10-CM | POA: Diagnosis not present

## 2023-10-07 DIAGNOSIS — S46012D Strain of muscle(s) and tendon(s) of the rotator cuff of left shoulder, subsequent encounter: Secondary | ICD-10-CM | POA: Diagnosis not present

## 2023-10-07 DIAGNOSIS — M50023 Cervical disc disorder at C6-C7 level with myelopathy: Secondary | ICD-10-CM | POA: Diagnosis not present

## 2023-10-07 DIAGNOSIS — M542 Cervicalgia: Secondary | ICD-10-CM | POA: Diagnosis not present

## 2023-10-17 DIAGNOSIS — G4733 Obstructive sleep apnea (adult) (pediatric): Secondary | ICD-10-CM | POA: Diagnosis not present

## 2023-10-23 ENCOUNTER — Ambulatory Visit: Payer: Medicare Other | Admitting: Obstetrics and Gynecology

## 2023-10-24 DIAGNOSIS — G959 Disease of spinal cord, unspecified: Secondary | ICD-10-CM | POA: Diagnosis not present

## 2023-10-24 DIAGNOSIS — M5412 Radiculopathy, cervical region: Secondary | ICD-10-CM | POA: Diagnosis not present

## 2023-10-24 DIAGNOSIS — M4180 Other forms of scoliosis, site unspecified: Secondary | ICD-10-CM | POA: Diagnosis not present

## 2023-11-04 ENCOUNTER — Ambulatory Visit (INDEPENDENT_AMBULATORY_CARE_PROVIDER_SITE_OTHER)

## 2023-11-04 ENCOUNTER — Other Ambulatory Visit (HOSPITAL_COMMUNITY)
Admission: RE | Admit: 2023-11-04 | Discharge: 2023-11-04 | Disposition: A | Discharge status: Home / Self Care | Source: Other Acute Inpatient Hospital | Attending: Obstetrics and Gynecology | Admitting: Obstetrics and Gynecology

## 2023-11-04 DIAGNOSIS — R350 Frequency of micturition: Secondary | ICD-10-CM

## 2023-11-04 DIAGNOSIS — N952 Postmenopausal atrophic vaginitis: Secondary | ICD-10-CM

## 2023-11-04 NOTE — Progress Notes (Signed)
 Patients son came and dropped off a urine sample. Urine sample was orange and the patient said she used Azo. Urine will be sent for culture.

## 2023-11-04 NOTE — Progress Notes (Signed)
 Pt's son coming to drop off a urine sample at 2pm

## 2023-11-05 LAB — URINE CULTURE: Culture: NO GROWTH

## 2023-11-07 MED ORDER — ESTRADIOL 0.1 MG/GM VA CREA: TOPICAL_CREAM | VAGINAL | 11 refills | Status: AC

## 2023-11-07 NOTE — Progress Notes (Signed)
Vaginal estrogen cream sent to pharmacy.

## 2023-11-07 NOTE — Addendum Note (Signed)
 Addended by: Marguerita Beards on: 11/07/2023 03:32 PM   Modules accepted: Orders

## 2023-11-13 ENCOUNTER — Other Ambulatory Visit: Payer: Self-pay | Admitting: Neurological Surgery

## 2023-11-14 DIAGNOSIS — G4733 Obstructive sleep apnea (adult) (pediatric): Secondary | ICD-10-CM | POA: Diagnosis not present

## 2023-11-17 NOTE — Pre-Procedure Instructions (Signed)
 Surgical Instructions   Your procedure is scheduled on Thursday, November 27, 2023. Report to [facility] Main Entrance "A" at 5:30 A.M., then check in with the Admitting office. Any questions or running late day of surgery: call [phone]  Questions prior to your surgery date: call [phone], Monday-Friday, 8am-4pm. If you experience any cold or flu symptoms such as cough, fever, chills, shortness of breath, etc. between now and your scheduled surgery, please notify us at the above number.     Remember:  Do not eat or drink after midnight the night before your surgery. This includes no gum, mints, or hard candy.   Take these medicines the morning of surgery with A SIP OF WATER :   Tramadol (Ultram) if needed   One week prior to surgery, STOP taking any Aspirin (unless otherwise instructed by your surgeon) Aleve, Naproxen, Ibuprofen, Motrin, Advil, Goody's, BC's, all herbal medications, fish oil, and non-prescription vitamins.                     Do NOT Smoke (Tobacco/Vaping) for 24 hours prior to your procedure.  If you use a CPAP at night, you may bring your mask/headgear for your overnight stay.   You will be asked to remove any contacts, glasses, piercing's, hearing aid's, dentures/partials prior to surgery. Please bring cases for these items if needed.    Patients discharged the day of surgery will not be allowed to drive home, and someone needs to stay with them for 24 hours.  SURGICAL WAITING ROOM VISITATION Patients may have no more than 2 support people in the waiting area - these visitors may rotate.   Pre-op nurse will coordinate an appropriate time for 1 ADULT support person, who may not rotate, to accompany patient in pre-op.  Children under the age of 33 must have an adult with them who is not the patient and must remain in the main waiting area with an adult.  If the patient needs to stay at the hospital during part of their recovery, the visitor guidelines for  inpatient rooms apply.  Please refer to the [facility] website for the visitor guidelines for any additional information.   If you received a COVID test during your pre-op visit  it is requested that you wear a mask when out in public, stay away from anyone that may not be feeling well and notify your surgeon if you develop symptoms. If you have been in contact with anyone that has tested positive in the last 10 days please notify you surgeon.      Pre-operative 5 CHG Bathing Instructions   You can play a key role in reducing the risk of infection after surgery. Your skin needs to be as free of germs as possible. You can reduce the number of germs on your skin by washing with CHG (chlorhexidine gluconate) soap before surgery. CHG is an antiseptic soap that kills germs and continues to kill germs even after washing.   DO NOT use if you have an allergy to chlorhexidine/CHG or antibacterial soaps. If your skin becomes reddened or irritated, stop using the CHG and notify one of our RNs at [phone].   Please shower with the CHG soap starting 4 days before surgery using the following schedule:     Please keep in mind the following:  DO NOT shave, including legs and underarms, starting the day of your first shower.   You may shave your face at any point before/day of surgery.  Place clean sheets on your bed the day you start using CHG soap. Use a clean washcloth (not used since being washed) for each shower. DO NOT sleep with pets once you start using the CHG.   CHG Shower Instructions:  Wash your face and private area with normal soap. If you choose to wash your hair, wash first with your normal shampoo.  After you use shampoo/soap, rinse your hair and body thoroughly to remove shampoo/soap residue.  Turn the water OFF and apply about 3 tablespoons (45 ml) of CHG soap to a CLEAN washcloth.  Apply CHG soap ONLY FROM YOUR NECK DOWN TO YOUR TOES (washing for 3-5 minutes)  DO NOT use CHG  soap on face, private areas, open wounds, or sores.  Pay special attention to the area where your surgery is being performed.  If you are having back surgery, having someone wash your back for you may be helpful. Wait 2 minutes after CHG soap is applied, then you may rinse off the CHG soap.  Pat dry with a clean towel  Put on clean clothes/pajamas   If you choose to wear lotion, please use ONLY the CHG-compatible lotions that are listed below.  Additional instructions for the day of surgery: DO NOT APPLY any lotions, deodorants, cologne, or perfumes.   Do not bring valuables to the hospital. [facility] is not responsible for any belongings/valuables. Do not wear nail polish, gel polish, artificial nails, or any other type of covering on natural nails (fingers and toes) Do not wear jewelry or makeup Put on clean/comfortable clothes.  Please brush your teeth.  Ask your nurse before applying any prescription medications to the skin.     CHG Compatible Lotions   Aveeno Moisturizing lotion  Cetaphil Moisturizing Cream  Cetaphil Moisturizing Lotion  Clairol Herbal Essence Moisturizing Lotion, Dry Skin  Clairol Herbal Essence Moisturizing Lotion, Extra Dry Skin  Clairol Herbal Essence Moisturizing Lotion, Normal Skin  Curel Age Defying Therapeutic Moisturizing Lotion with Alpha Hydroxy  Curel Extreme Care Body Lotion  Curel Soothing Hands Moisturizing Hand Lotion  Curel Therapeutic Moisturizing Cream, Fragrance-Free  Curel Therapeutic Moisturizing Lotion, Fragrance-Free  Curel Therapeutic Moisturizing Lotion, Original Formula  Eucerin Daily Replenishing Lotion  Eucerin Dry Skin Therapy Plus Alpha Hydroxy Crme  Eucerin Dry Skin Therapy Plus Alpha Hydroxy Lotion  Eucerin Original Crme  Eucerin Original Lotion  Eucerin Plus Crme Eucerin Plus Lotion  Eucerin TriLipid Replenishing Lotion  Keri Anti-Bacterial Hand Lotion  Keri Deep Conditioning Original Lotion Dry Skin Formula Softly  Scented  Keri Deep Conditioning Original Lotion, Fragrance Free Sensitive Skin Formula  Keri Lotion Fast Absorbing Fragrance Free Sensitive Skin Formula  Keri Lotion Fast Absorbing Softly Scented Dry Skin Formula  Keri Original Lotion  Keri Skin Renewal Lotion Keri Silky Smooth Lotion  Keri Silky Smooth Sensitive Skin Lotion  Nivea Body Creamy Conditioning Oil  Nivea Body Extra Enriched Lotion  Nivea Body Original Lotion  Nivea Body Sheer Moisturizing Lotion Nivea Crme  Nivea Skin Firming Lotion  NutraDerm 30 Skin Lotion  NutraDerm Skin Lotion  NutraDerm Therapeutic Skin Cream  NutraDerm Therapeutic Skin Lotion  ProShield Protective Hand Cream  Provon moisturizing lotion  Please read over the following fact sheets that you were given.

## 2023-11-18 ENCOUNTER — Inpatient Hospital Stay (HOSPITAL_COMMUNITY)
Admission: RE | Admit: 2023-11-18 | Discharge: 2023-11-18 | Disposition: A | Discharge status: Home / Self Care | Source: Ambulatory Visit

## 2023-11-20 ENCOUNTER — Other Ambulatory Visit: Payer: Self-pay

## 2023-11-20 ENCOUNTER — Encounter (HOSPITAL_COMMUNITY)
Admission: RE | Admit: 2023-11-20 | Discharge: 2023-11-20 | Disposition: A | Discharge status: Home / Self Care | Source: Ambulatory Visit | Attending: Neurological Surgery | Admitting: Neurological Surgery

## 2023-11-20 ENCOUNTER — Encounter (HOSPITAL_COMMUNITY): Payer: Self-pay

## 2023-11-20 VITALS — BP 148/71 | HR 79 | Temp 97.6°F | Resp 18 | Ht 60.0 in | Wt 104.3 lb

## 2023-11-20 DIAGNOSIS — Z01818 Encounter for other preprocedural examination: Secondary | ICD-10-CM

## 2023-11-20 DIAGNOSIS — Z01812 Encounter for preprocedural laboratory examination: Secondary | ICD-10-CM | POA: Diagnosis not present

## 2023-11-20 HISTORY — DX: Sleep apnea, unspecified: G47.30

## 2023-11-20 HISTORY — DX: Myoneural disorder, unspecified: G70.9

## 2023-11-20 LAB — CBC
HCT: 31.9 % — ABNORMAL LOW (ref 36.0–46.0)
Hemoglobin: 10.5 g/dL — ABNORMAL LOW (ref 12.0–15.0)
MCH: 34 pg (ref 26.0–34.0)
MCHC: 32.9 g/dL (ref 30.0–36.0)
MCV: 103.2 fL — ABNORMAL HIGH (ref 80.0–100.0)
Platelets: 220 10*3/uL (ref 150–400)
RBC: 3.09 MIL/uL — ABNORMAL LOW (ref 3.87–5.11)
RDW: 14.2 % (ref 11.5–15.5)
WBC: 7.3 10*3/uL (ref 4.0–10.5)
nRBC: 0 % (ref 0.0–0.2)

## 2023-11-20 LAB — TYPE AND SCREEN
ABO/RH(D): O POS
Antibody Screen: NEGATIVE

## 2023-11-20 LAB — SURGICAL PCR SCREEN
MRSA, PCR: NEGATIVE
Staphylococcus aureus: NEGATIVE

## 2023-11-20 LAB — BASIC METABOLIC PANEL
Anion gap: 7 (ref 5–15)
BUN: 20 mg/dL (ref 8–23)
CO2: 27 mmol/L (ref 22–32)
Calcium: 9.1 mg/dL (ref 8.9–10.3)
Chloride: 104 mmol/L (ref 98–111)
Creatinine, Ser: 0.82 mg/dL (ref 0.44–1.00)
GFR, Estimated: >60 mL/min (ref >60)
Glucose, Bld: 87 mg/dL (ref 70–99)
Potassium: 4.7 mmol/L (ref 3.5–5.1)
Sodium: 138 mmol/L (ref 135–145)

## 2023-11-20 NOTE — Progress Notes (Signed)
 PCP - Dr. 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 Pt wears CPAP nightly. Pressure setting is 11  No DM  Last dose of GLP1 agonist- n/a GLP1 instructions: n/a  Blood Thinner Instructions: n/a Aspirin Instructions: n/a  NPO after midnight  COVID TEST- n/a   Anesthesia review: No.  Patient denies shortness of breath, fever, cough and chest pain at PAT appointment. Pt denies any respiratory illness/infection in the last two months.   All instructions explained to the patient, with a verbal understanding of the material. Patient agrees to go over the instructions while at home for a better understanding. Patient also instructed to self quarantine after being tested for COVID-19. The opportunity to ask questions was provided.

## 2023-11-21 ENCOUNTER — Ambulatory Visit: Payer: Medicare Other | Admitting: Obstetrics and Gynecology

## 2023-11-21 ENCOUNTER — Encounter: Payer: Self-pay | Admitting: Obstetrics and Gynecology

## 2023-11-21 DIAGNOSIS — N8110 Cystocele, unspecified: Secondary | ICD-10-CM

## 2023-11-21 DIAGNOSIS — N812 Incomplete uterovaginal prolapse: Secondary | ICD-10-CM | POA: Diagnosis not present

## 2023-11-21 DIAGNOSIS — N393 Stress incontinence (female) (male): Secondary | ICD-10-CM | POA: Diagnosis not present

## 2023-11-21 NOTE — Telephone Encounter (Signed)
 Prolia VOB initiated via [email or web address]  Next Prolia inj DUE: 12/20/23

## 2023-11-21 NOTE — Patient Instructions (Addendum)
 Plan for surgery: Anterior repair, sacrospinous ligament fixation, cystoscopy, urethral bulking

## 2023-11-21 NOTE — Progress Notes (Signed)
 [facility]   Subjective:     Chief Complaint:  Chief Complaint  Patient presents with   Pessary Check   History of Present Illness: Loray Akard is a 83 y.o. female with stage II pelvic organ prolapse and stress incontinence who presents for a pessary check. She is using a size #2 incontinence dish pessary and it has been working well. She is interested in surgery but wants to wait until June due to need for spine surgery. Previously had discussed urethral bulking for SUI symptoms.   Urodynamic Impression:  1. Sensation was normal; capacity was normal 2. Stress Incontinence was demonstrated at ISD range pressures; 3. Detrusor Overactivity was not demonstrated. 4. Emptying was dysfunctional with a normal PVR, a sustained detrusor contraction not present (low amplitude, intermittent contraction),  abdominal straining present, normal urethral sphincter activity on EMG.  Past Medical History: Patient  has a past medical history of Anemia, Arthritis, Back pain, Cancer (HCC), Leg weakness, Neuromuscular disorder (HCC), Osteopenia, PONV (postoperative nausea and vomiting), Right leg pain, Rotator cuff tear, Seizures (HCC), Sleep apnea, Spinal cord tumor, Urinary tract infection, and Wears hearing aid.   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   Medications: She has a current medication list which includes the following prescription(s): acetylcysteine, ascorbic acid, estradiol, lamotrigine, lidocaine, magnesium, NON FORMULARY, NONFORMULARY OR COMPOUNDED ITEM, omega-3 fatty acids, probiotic product, pyridoxine, tramadol, and turmeric.    Allergies: Patient is allergic to ibuprofen-famotidine, fentanyl, pregabalin, and penicillins.   Social History: Patient  reports that she quit smoking about 56 years ago. Her smoking use included cigarettes. She started smoking about 58 years ago. She has a 0.5 pack-year smoking history. She has never used smokeless tobacco. She reports current alcohol use of about 2.0 - 3.0 standard drinks of alcohol per week. She reports that she does not use drugs.      Objective:    Physical Exam: LMP  (LMP Unknown)  Gen: No apparent distress, A&O x 3. Detailed Urogynecologic Evaluation:  Pelvic Exam: Normal external female genitalia; Bartholin's and Skene's glands normal in appearance; urethral meatus with caruncle, no urethral masses or discharge. Pessary was removed and cleaned. Speculum exam showed no lesions. Pessary was replaced.   POP-Q  -1                                            Aa   -1                                           Ba  -6                                              C   2  Gh  4                                            Pb  6.5                                            tvl   -2                                            Ap  -2                                            Bp                                                 D     Assessment/Plan:    Assessment: Ms. Branscum is a 83 y.o. with stage II pelvic organ prolapse and stress incontinence here for a pessary check and discussion about surgery.   Plan for surgery: Exam under anesthesia, anterior repair with sacrospinous fixation, cystoscopy, urethral bulking, possible posterior repair  - We reviewed the patient's specific anatomic and functional findings, with the assistance of diagrams, and together finalized the above procedure. The planned surgical procedures were discussed along with the surgical risks outlined below, which were also provided on a detailed  handout. Additional treatment options including expectant management, conservative management, medical management were discussed where appropriate.  We reviewed the benefits and risks of each treatment option.   General Surgical Risks: For all procedures, there are risks of bleeding, infection, damage to surrounding organs including but not limited to bowel, bladder, blood vessels, ureters and nerves, and need for further surgery if an injury were to occur. These risks are all low with minimally invasive surgery.   - Medical clearance: not required  - Anticoagulant use: No - Medicaid Hysterectomy form: n/a - Accepts blood transfusion: Yes - Expected length of stay: outpatient  Request sent for surgery scheduling.   Marguerita Beards, MD

## 2023-11-26 MED ORDER — VANCOMYCIN HCL 750 MG/150ML IV SOLN
750.0000 mg | INTRAVENOUS | Status: AC
  Administered 2023-11-27: 750 mg via INTRAVENOUS
  Filled 2023-11-26 (×3): qty 150

## 2023-11-26 NOTE — Anesthesia Preprocedure Evaluation (Signed)
 Anesthesia Evaluation  Patient identified by MRN, date of birth, ID band Patient awake    Reviewed: Allergy & Precautions, H&P , NPO status , Patient's Chart, lab work & pertinent test results  History of Anesthesia Complications (+) PONV and history of anesthetic complications  Airway Mallampati: II  TM Distance: >3 FB Neck ROM: Full    Dental no notable dental hx. (+) Teeth Intact, Dental Advisory Given   Pulmonary neg pulmonary ROS, former smoker   Pulmonary exam normal breath sounds clear to auscultation       Cardiovascular Exercise Tolerance: Good hypertension,  Rhythm:Regular Rate:Normal     Neuro/Psych  Headaches, Seizures -, Well Controlled,   negative psych ROS   GI/Hepatic negative GI ROS, Neg liver ROS,,,  Endo/Other  negative endocrine ROS    Renal/GU negative Renal ROS  negative genitourinary   Musculoskeletal  (+) Arthritis , Osteoarthritis,    Abdominal   Peds  Hematology  (+) Blood dyscrasia, anemia   Anesthesia Other Findings   Reproductive/Obstetrics negative OB ROS                             Anesthesia Physical Anesthesia Plan  ASA: 3  Anesthesia Plan: General   Post-op Pain Management: Tylenol PO (pre-op)*   Induction: Intravenous  PONV Risk Score and Plan: 4 or greater and Ondansetron, Dexamethasone and Droperidol  Airway Management Planned: Oral ETT and Video Laryngoscope Planned  Additional Equipment: ClearSight  Intra-op Plan:   Post-operative Plan: Extubation in OR  Informed Consent: I have reviewed the patients History and Physical, chart, labs and discussed the procedure including the risks, benefits and alternatives for the proposed anesthesia with the patient or authorized representative who has indicated his/her understanding and acceptance.     Dental advisory given  Plan Discussed with: CRNA  Anesthesia Plan Comments:         Anesthesia Quick Evaluation

## 2023-11-27 ENCOUNTER — Inpatient Hospital Stay (HOSPITAL_COMMUNITY)
Admission: RE | Admit: 2023-11-27 | Discharge: 2023-11-28 | DRG: 473 | Disposition: A | Discharge status: Home / Self Care | Attending: Neurological Surgery | Admitting: Neurological Surgery

## 2023-11-27 ENCOUNTER — Encounter (HOSPITAL_COMMUNITY)
Admission: RE | Disposition: A | Discharge status: Home / Self Care | Payer: Self-pay | Source: Home / Self Care | Attending: Neurological Surgery

## 2023-11-27 ENCOUNTER — Inpatient Hospital Stay (HOSPITAL_COMMUNITY): Payer: Self-pay | Admitting: Certified Registered Nurse Anesthetist

## 2023-11-27 ENCOUNTER — Other Ambulatory Visit: Payer: Self-pay

## 2023-11-27 ENCOUNTER — Encounter (HOSPITAL_COMMUNITY): Payer: Self-pay | Admitting: Neurological Surgery

## 2023-11-27 ENCOUNTER — Inpatient Hospital Stay (HOSPITAL_COMMUNITY)

## 2023-11-27 DIAGNOSIS — Z87440 Personal history of urinary (tract) infections: Secondary | ICD-10-CM | POA: Diagnosis not present

## 2023-11-27 DIAGNOSIS — Z974 Presence of external hearing-aid: Secondary | ICD-10-CM

## 2023-11-27 DIAGNOSIS — M4156 Other secondary scoliosis, lumbar region: Secondary | ICD-10-CM | POA: Diagnosis not present

## 2023-11-27 DIAGNOSIS — Z9049 Acquired absence of other specified parts of digestive tract: Secondary | ICD-10-CM | POA: Diagnosis not present

## 2023-11-27 DIAGNOSIS — M2578 Osteophyte, vertebrae: Secondary | ICD-10-CM | POA: Diagnosis not present

## 2023-11-27 DIAGNOSIS — G4733 Obstructive sleep apnea (adult) (pediatric): Secondary | ICD-10-CM | POA: Diagnosis not present

## 2023-11-27 DIAGNOSIS — Z981 Arthrodesis status: Secondary | ICD-10-CM

## 2023-11-27 DIAGNOSIS — M4722 Other spondylosis with radiculopathy, cervical region: Secondary | ICD-10-CM | POA: Diagnosis not present

## 2023-11-27 DIAGNOSIS — Z9889 Other specified postprocedural states: Secondary | ICD-10-CM | POA: Diagnosis not present

## 2023-11-27 DIAGNOSIS — M50021 Cervical disc disorder at C4-C5 level with myelopathy: Secondary | ICD-10-CM | POA: Diagnosis not present

## 2023-11-27 DIAGNOSIS — R569 Unspecified convulsions: Secondary | ICD-10-CM | POA: Diagnosis present

## 2023-11-27 DIAGNOSIS — M4712 Other spondylosis with myelopathy, cervical region: Secondary | ICD-10-CM | POA: Diagnosis not present

## 2023-11-27 DIAGNOSIS — Z86011 Personal history of benign neoplasm of the brain: Secondary | ICD-10-CM | POA: Diagnosis not present

## 2023-11-27 DIAGNOSIS — Z79899 Other long term (current) drug therapy: Secondary | ICD-10-CM

## 2023-11-27 DIAGNOSIS — G629 Polyneuropathy, unspecified: Secondary | ICD-10-CM | POA: Diagnosis not present

## 2023-11-27 DIAGNOSIS — M1990 Unspecified osteoarthritis, unspecified site: Secondary | ICD-10-CM | POA: Diagnosis present

## 2023-11-27 DIAGNOSIS — Z880 Allergy status to penicillin: Secondary | ICD-10-CM

## 2023-11-27 DIAGNOSIS — Z823 Family history of stroke: Secondary | ICD-10-CM | POA: Diagnosis not present

## 2023-11-27 DIAGNOSIS — Z8249 Family history of ischemic heart disease and other diseases of the circulatory system: Secondary | ICD-10-CM | POA: Diagnosis not present

## 2023-11-27 DIAGNOSIS — Z9089 Acquired absence of other organs: Secondary | ICD-10-CM | POA: Diagnosis not present

## 2023-11-27 DIAGNOSIS — G4730 Sleep apnea, unspecified: Secondary | ICD-10-CM | POA: Diagnosis present

## 2023-11-27 DIAGNOSIS — Z85828 Personal history of other malignant neoplasm of skin: Secondary | ICD-10-CM | POA: Diagnosis not present

## 2023-11-27 DIAGNOSIS — M5412 Radiculopathy, cervical region: Secondary | ICD-10-CM | POA: Diagnosis not present

## 2023-11-27 DIAGNOSIS — Z90710 Acquired absence of both cervix and uterus: Secondary | ICD-10-CM

## 2023-11-27 DIAGNOSIS — I10 Essential (primary) hypertension: Secondary | ICD-10-CM | POA: Diagnosis not present

## 2023-11-27 DIAGNOSIS — G959 Disease of spinal cord, unspecified: Secondary | ICD-10-CM

## 2023-11-27 DIAGNOSIS — M8580 Other specified disorders of bone density and structure, unspecified site: Secondary | ICD-10-CM | POA: Diagnosis present

## 2023-11-27 DIAGNOSIS — Z888 Allergy status to other drugs, medicaments and biological substances status: Secondary | ICD-10-CM

## 2023-11-27 DIAGNOSIS — Z87891 Personal history of nicotine dependence: Secondary | ICD-10-CM

## 2023-11-27 DIAGNOSIS — Z885 Allergy status to narcotic agent status: Secondary | ICD-10-CM

## 2023-11-27 DIAGNOSIS — Z886 Allergy status to analgesic agent status: Secondary | ICD-10-CM

## 2023-11-27 DIAGNOSIS — Z923 Personal history of irradiation: Secondary | ICD-10-CM

## 2023-11-27 DIAGNOSIS — Z809 Family history of malignant neoplasm, unspecified: Secondary | ICD-10-CM

## 2023-11-27 HISTORY — PX: ANTERIOR CERVICAL DECOMP/DISCECTOMY FUSION: SHX1161

## 2023-11-27 SURGERY — ANTERIOR CERVICAL DECOMPRESSION/DISCECTOMY FUSION 3 LEVELS
Anesthesia: General

## 2023-11-27 MED ORDER — PHENYLEPHRINE 80 MCG/ML (10ML) SYRINGE FOR IV PUSH (FOR BLOOD PRESSURE SUPPORT)
PREFILLED_SYRINGE | INTRAVENOUS | Status: AC
  Filled 2023-11-27: qty 10

## 2023-11-27 MED ORDER — ACETAMINOPHEN 325 MG PO TABS: 650.0000 mg | ORAL_TABLET | Freq: Once | ORAL | Status: AC

## 2023-11-27 MED ORDER — SUGAMMADEX SODIUM 200 MG/2ML IV SOLN
INTRAVENOUS | Status: DC | PRN
  Administered 2023-11-27: 100 mg via INTRAVENOUS

## 2023-11-27 MED ORDER — PROPOFOL 1000 MG/100ML IV EMUL
INTRAVENOUS | Status: AC
  Filled 2023-11-27: qty 100

## 2023-11-27 MED ORDER — ORAL CARE MOUTH RINSE: 15.0000 mL | Freq: Once | OROMUCOSAL | Status: AC

## 2023-11-27 MED ORDER — CHLORHEXIDINE GLUCONATE 0.12 % MT SOLN
15.0000 mL | Freq: Once | OROMUCOSAL | Status: AC
  Administered 2023-11-27: 15 mL via OROMUCOSAL
  Filled 2023-11-27: qty 15

## 2023-11-27 MED ORDER — DEXAMETHASONE SODIUM PHOSPHATE 10 MG/ML IJ SOLN
INTRAMUSCULAR | Status: DC | PRN
  Administered 2023-11-27: 10 mg via INTRAVENOUS

## 2023-11-27 MED ORDER — BUPIVACAINE HCL (PF) 0.5 % IJ SOLN
INTRAMUSCULAR | Status: AC
  Filled 2023-11-27: qty 30

## 2023-11-27 MED ORDER — LIDOCAINE 2% (20 MG/ML) 5 ML SYRINGE
INTRAMUSCULAR | Status: AC
  Filled 2023-11-27: qty 5

## 2023-11-27 MED ORDER — PROPOFOL 10 MG/ML IV BOLUS
INTRAVENOUS | Status: AC
  Filled 2023-11-27: qty 20

## 2023-11-27 MED ORDER — OXYCODONE-ACETAMINOPHEN 5-325 MG PO TABS
1.0000 | ORAL_TABLET | Freq: Four times a day (QID) | ORAL | Status: DC | PRN
  Administered 2023-11-27: 1 via ORAL
  Filled 2023-11-27: qty 1

## 2023-11-27 MED ORDER — SODIUM CHLORIDE 0.9% FLUSH
3.0000 mL | Freq: Two times a day (BID) | INTRAVENOUS | Status: DC
  Administered 2023-11-27 (×2): 3 mL via INTRAVENOUS

## 2023-11-27 MED ORDER — ACETAMINOPHEN 325 MG PO TABS: 650.0000 mg | ORAL_TABLET | ORAL | Status: DC | PRN

## 2023-11-27 MED ORDER — EPHEDRINE 5 MG/ML INJ
INTRAVENOUS | Status: AC
  Filled 2023-11-27: qty 5

## 2023-11-27 MED ORDER — ONDANSETRON HCL 4 MG/2ML IJ SOLN
INTRAMUSCULAR | Status: DC | PRN
  Administered 2023-11-27: 4 mg via INTRAVENOUS

## 2023-11-27 MED ORDER — VANCOMYCIN HCL IN DEXTROSE 1-5 GM/200ML-% IV SOLN
1000.0000 mg | Freq: Once | INTRAVENOUS | Status: AC
  Administered 2023-11-28: 1000 mg via INTRAVENOUS
  Filled 2023-11-27: qty 200

## 2023-11-27 MED ORDER — RISAQUAD PO CAPS
1.0000 | ORAL_CAPSULE | Freq: Every day | ORAL | Status: DC
  Administered 2023-11-27 – 2023-11-28 (×2): 1 via ORAL
  Filled 2023-11-27 (×2): qty 1

## 2023-11-27 MED ORDER — HYDROMORPHONE HCL 1 MG/ML IJ SOLN
INTRAMUSCULAR | Status: AC
  Filled 2023-11-27: qty 1

## 2023-11-27 MED ORDER — FLEET ENEMA RE ENEM: 1.0000 | ENEMA | Freq: Once | RECTAL | Status: DC | PRN

## 2023-11-27 MED ORDER — ALBUMIN HUMAN 5 % IV SOLN: INTRAVENOUS | Status: DC | PRN

## 2023-11-27 MED ORDER — HYDROMORPHONE HCL 1 MG/ML IJ SOLN
INTRAMUSCULAR | Status: AC
  Filled 2023-11-27: qty 0.5

## 2023-11-27 MED ORDER — ONDANSETRON HCL 4 MG PO TABS: 4.0000 mg | ORAL_TABLET | Freq: Four times a day (QID) | ORAL | Status: DC | PRN

## 2023-11-27 MED ORDER — LIDOCAINE 2% (20 MG/ML) 5 ML SYRINGE
INTRAMUSCULAR | Status: DC | PRN
  Administered 2023-11-27: 60 mg via INTRAVENOUS

## 2023-11-27 MED ORDER — HYDROMORPHONE HCL 1 MG/ML IJ SOLN
INTRAMUSCULAR | Status: DC | PRN
  Administered 2023-11-27: .5 mg via INTRAVENOUS

## 2023-11-27 MED ORDER — FENTANYL CITRATE (PF) 100 MCG/2ML IJ SOLN
25.0000 ug | INTRAMUSCULAR | Status: DC | PRN
  Administered 2023-11-27 (×2): 50 ug via INTRAVENOUS

## 2023-11-27 MED ORDER — FENTANYL CITRATE (PF) 100 MCG/2ML IJ SOLN
INTRAMUSCULAR | Status: AC
  Filled 2023-11-27: qty 2

## 2023-11-27 MED ORDER — CHLORHEXIDINE GLUCONATE CLOTH 2 % EX PADS: 6.0000 | MEDICATED_PAD | Freq: Once | CUTANEOUS | Status: DC

## 2023-11-27 MED ORDER — 0.9 % SODIUM CHLORIDE (POUR BTL) OPTIME
TOPICAL | Status: DC | PRN
  Administered 2023-11-27: 1000 mL

## 2023-11-27 MED ORDER — THROMBIN 5000 UNITS EX KIT
PACK | CUTANEOUS | Status: AC
  Filled 2023-11-27: qty 1

## 2023-11-27 MED ORDER — OXYCODONE-ACETAMINOPHEN 5-325 MG PO TABS
1.0000 | ORAL_TABLET | ORAL | Status: DC | PRN
  Administered 2023-11-27 – 2023-11-28 (×3): 1 via ORAL
  Filled 2023-11-27 (×3): qty 1

## 2023-11-27 MED ORDER — HYDROMORPHONE HCL 1 MG/ML IJ SOLN: 0.5000 mg | INTRAMUSCULAR | Status: DC | PRN

## 2023-11-27 MED ORDER — BUPIVACAINE HCL (PF) 0.5 % IJ SOLN
INTRAMUSCULAR | Status: DC | PRN
  Administered 2023-11-27: 4.5 mL

## 2023-11-27 MED ORDER — PHENOL 1.4 % MT LIQD: 1.0000 | OROMUCOSAL | Status: DC | PRN

## 2023-11-27 MED ORDER — ROCURONIUM BROMIDE 10 MG/ML (PF) SYRINGE
PREFILLED_SYRINGE | INTRAVENOUS | Status: AC
  Filled 2023-11-27: qty 10

## 2023-11-27 MED ORDER — PHENYLEPHRINE HCL-NACL 20-0.9 MG/250ML-% IV SOLN
INTRAVENOUS | Status: DC | PRN
  Administered 2023-11-27: 25 ug/min via INTRAVENOUS

## 2023-11-27 MED ORDER — METHOCARBAMOL 1000 MG/10ML IJ SOLN: 500.0000 mg | Freq: Four times a day (QID) | INTRAMUSCULAR | Status: DC | PRN

## 2023-11-27 MED ORDER — ACETAMINOPHEN 325 MG PO TABS
ORAL_TABLET | ORAL | Status: AC
  Administered 2023-11-27: 650 mg via ORAL
  Filled 2023-11-27: qty 2

## 2023-11-27 MED ORDER — HYDROMORPHONE HCL 1 MG/ML IJ SOLN
0.2500 mg | INTRAMUSCULAR | Status: DC | PRN
  Administered 2023-11-27 (×3): 0.5 mg via INTRAVENOUS

## 2023-11-27 MED ORDER — FENTANYL CITRATE (PF) 250 MCG/5ML IJ SOLN
INTRAMUSCULAR | Status: DC | PRN
Start: 2023-11-27 — End: 2023-11-27
  Administered 2023-11-27: 50 ug via INTRAVENOUS
  Administered 2023-11-27: 100 ug via INTRAVENOUS
  Administered 2023-11-27: 25 ug via INTRAVENOUS
  Administered 2023-11-27: 50 ug via INTRAVENOUS
  Administered 2023-11-27: 25 ug via INTRAVENOUS

## 2023-11-27 MED ORDER — PROPOFOL 10 MG/ML IV BOLUS
INTRAVENOUS | Status: DC | PRN
  Administered 2023-11-27: 80 mg via INTRAVENOUS

## 2023-11-27 MED ORDER — VASOPRESSIN 20 UNIT/ML IV SOLN
INTRAVENOUS | Status: AC
  Filled 2023-11-27: qty 1

## 2023-11-27 MED ORDER — BISACODYL 10 MG RE SUPP: 10.0000 mg | Freq: Every day | RECTAL | Status: DC | PRN

## 2023-11-27 MED ORDER — POLYETHYLENE GLYCOL 3350 17 G PO PACK: 17.0000 g | PACK | Freq: Every day | ORAL | Status: DC | PRN

## 2023-11-27 MED ORDER — METHOCARBAMOL 500 MG PO TABS
500.0000 mg | ORAL_TABLET | Freq: Four times a day (QID) | ORAL | Status: DC | PRN
  Administered 2023-11-27: 500 mg via ORAL
  Filled 2023-11-27: qty 1

## 2023-11-27 MED ORDER — ONDANSETRON HCL 4 MG/2ML IJ SOLN: 4.0000 mg | Freq: Four times a day (QID) | INTRAMUSCULAR | Status: DC | PRN

## 2023-11-27 MED ORDER — FENTANYL CITRATE (PF) 250 MCG/5ML IJ SOLN
INTRAMUSCULAR | Status: AC
  Filled 2023-11-27: qty 5

## 2023-11-27 MED ORDER — SENNA 8.6 MG PO TABS
1.0000 | ORAL_TABLET | Freq: Two times a day (BID) | ORAL | Status: DC
  Administered 2023-11-27 – 2023-11-28 (×2): 8.6 mg via ORAL
  Filled 2023-11-27 (×2): qty 1

## 2023-11-27 MED ORDER — ACETAMINOPHEN 650 MG RE SUPP: 650.0000 mg | RECTAL | Status: DC | PRN

## 2023-11-27 MED ORDER — THROMBIN 5000 UNITS EX SOLR
OROMUCOSAL | Status: DC | PRN
  Administered 2023-11-27: 5 mL via TOPICAL

## 2023-11-27 MED ORDER — SODIUM CHLORIDE 0.9 % IV SOLN
250.0000 mL | INTRAVENOUS | Status: DC
  Administered 2023-11-27: 250 mL via INTRAVENOUS

## 2023-11-27 MED ORDER — ONDANSETRON HCL 4 MG/2ML IJ SOLN
INTRAMUSCULAR | Status: AC
  Filled 2023-11-27: qty 2

## 2023-11-27 MED ORDER — LAMOTRIGINE 25 MG PO TABS
50.0000 mg | ORAL_TABLET | Freq: Every evening | ORAL | Status: DC
  Administered 2023-11-27: 50 mg via ORAL
  Filled 2023-11-27: qty 2

## 2023-11-27 MED ORDER — ACETAMINOPHEN 500 MG PO TABS
1000.0000 mg | ORAL_TABLET | Freq: Once | ORAL | Status: DC
  Filled 2023-11-27: qty 2

## 2023-11-27 MED ORDER — LIDOCAINE-EPINEPHRINE 1 %-1:100000 IJ SOLN
INTRAMUSCULAR | Status: DC | PRN
  Administered 2023-11-27: 4.5 mL

## 2023-11-27 MED ORDER — MENTHOL 3 MG MT LOZG: 1.0000 | LOZENGE | OROMUCOSAL | Status: DC | PRN

## 2023-11-27 MED ORDER — LIDOCAINE-EPINEPHRINE 1 %-1:100000 IJ SOLN
INTRAMUSCULAR | Status: AC
  Filled 2023-11-27: qty 1

## 2023-11-27 MED ORDER — ROCURONIUM BROMIDE 10 MG/ML (PF) SYRINGE
PREFILLED_SYRINGE | INTRAVENOUS | Status: DC | PRN
  Administered 2023-11-27: 20 mg via INTRAVENOUS
  Administered 2023-11-27: 40 mg via INTRAVENOUS
  Administered 2023-11-27: 20 mg via INTRAVENOUS

## 2023-11-27 MED ORDER — LACTATED RINGERS IV SOLN: INTRAVENOUS | Status: DC

## 2023-11-27 MED ORDER — SODIUM CHLORIDE 0.9 % IV SOLN
INTRAVENOUS | Status: DC | PRN
Start: 2023-11-27 — End: 2023-11-27

## 2023-11-27 MED ORDER — ALUM & MAG HYDROXIDE-SIMETH 200-200-20 MG/5ML PO SUSP: 30.0000 mL | Freq: Four times a day (QID) | ORAL | Status: DC | PRN

## 2023-11-27 MED ORDER — SODIUM CHLORIDE 0.9% FLUSH: 3.0000 mL | INTRAVENOUS | Status: DC | PRN

## 2023-11-27 MED ORDER — DOCUSATE SODIUM 100 MG PO CAPS
100.0000 mg | ORAL_CAPSULE | Freq: Two times a day (BID) | ORAL | Status: DC
  Administered 2023-11-27 – 2023-11-28 (×3): 100 mg via ORAL
  Filled 2023-11-27 (×3): qty 1

## 2023-11-27 MED ORDER — DEXAMETHASONE SODIUM PHOSPHATE 10 MG/ML IJ SOLN
INTRAMUSCULAR | Status: AC
  Filled 2023-11-27: qty 1

## 2023-11-27 MED ORDER — PROPOFOL 500 MG/50ML IV EMUL
INTRAVENOUS | Status: DC | PRN
  Administered 2023-11-27: 150 ug/kg/min via INTRAVENOUS
  Administered 2023-11-27: 25 mg via INTRAVENOUS

## 2023-11-27 SURGICAL SUPPLY — 50 items
BAG COUNTER SPONGE SURGICOUNT (BAG) ×2 IMPLANT
BAND RUBBER #18 3X1/16 STRL (MISCELLANEOUS) ×4 IMPLANT
BIT DRILL ACP 15 (DRILL) IMPLANT
BIT DRILL NEURO 2X3.1 SFT TUCH (MISCELLANEOUS) ×2 IMPLANT
BNDG GAUZE DERMACEA FLUFF 4 (GAUZE/BANDAGES/DRESSINGS) IMPLANT
BUR BARREL STRAIGHT FLUTE 4.0 (BURR) IMPLANT
CAGE CERV MOD 5X15X12 7D (Cage) IMPLANT
CAGE CERV MOD 6X15X12 7D (Cage) IMPLANT
CANISTER SUCT 3000ML PPV (MISCELLANEOUS) ×2 IMPLANT
DERMABOND ADVANCED .7 DNX12 (GAUZE/BANDAGES/DRESSINGS) ×2 IMPLANT
DRAPE LAPAROTOMY 100X72 PEDS (DRAPES) ×2 IMPLANT
DRAPE MICROSCOPE SLANT 54X150 (MISCELLANEOUS) IMPLANT
DRILL ACP 15 (DRILL) ×1 IMPLANT
DRILL NEURO 2X3.1 SOFT TOUCH (MISCELLANEOUS) ×1 IMPLANT
DURAPREP 6ML APPLICATOR 50/CS (WOUND CARE) ×2 IMPLANT
ELECT COATED BLADE 2.86 ST (ELECTRODE) ×2 IMPLANT
ELECT REM PT RETURN 9FT ADLT (ELECTROSURGICAL) ×1 IMPLANT
ELECTRODE REM PT RTRN 9FT ADLT (ELECTROSURGICAL) ×2 IMPLANT
GAUZE 4X4 16PLY ~~LOC~~+RFID DBL (SPONGE) IMPLANT
GLOVE BIOGEL PI IND STRL 8.5 (GLOVE) ×2 IMPLANT
GLOVE ECLIPSE 8.5 STRL (GLOVE) ×2 IMPLANT
GLOVE EXAM NITRILE XL STR (GLOVE) IMPLANT
GOWN STRL REUS W/ TWL LRG LVL3 (GOWN DISPOSABLE) IMPLANT
GOWN STRL REUS W/ TWL XL LVL3 (GOWN DISPOSABLE) ×2 IMPLANT
GOWN STRL REUS W/TWL 2XL LVL3 (GOWN DISPOSABLE) ×2 IMPLANT
HALTER HD/CHIN CERV TRACTION D (MISCELLANEOUS) ×2 IMPLANT
HEMOSTAT POWDER KIT SURGIFOAM (HEMOSTASIS) ×2 IMPLANT
KIT BASIN OR (CUSTOM PROCEDURE TRAY) ×2 IMPLANT
KIT TURNOVER KIT B (KITS) ×2 IMPLANT
NDL HYPO 22X1.5 SAFETY MO (MISCELLANEOUS) ×2 IMPLANT
NDL SPNL 22GX3.5 QUINCKE BK (NEEDLE) ×2 IMPLANT
NEEDLE HYPO 22X1.5 SAFETY MO (MISCELLANEOUS) ×1 IMPLANT
NEEDLE SPNL 22GX3.5 QUINCKE BK (NEEDLE) ×1 IMPLANT
NS IRRIG 1000ML POUR BTL (IV SOLUTION) ×2 IMPLANT
PACK LAMINECTOMY NEURO (CUSTOM PROCEDURE TRAY) ×2 IMPLANT
PAD ARMBOARD POSITIONER FOAM (MISCELLANEOUS) ×6 IMPLANT
PATTIES SURGICAL .5 X1 (DISPOSABLE) ×2 IMPLANT
PIN DISTRACTION 14MM (PIN) IMPLANT
PLATE ACP 1.9X52 3LVL (Plate) IMPLANT
PUTTY BONE DBX 2.5 MIS (Bone Implant) IMPLANT
SCREW ACP VA ST 3.5X15 (Screw) IMPLANT
SET WALTER ACTIVATION W/DRAPE (SET/KITS/TRAYS/PACK) ×2 IMPLANT
SPIKE FLUID TRANSFER (MISCELLANEOUS) ×2 IMPLANT
SPONGE INTESTINAL PEANUT (DISPOSABLE) ×2 IMPLANT
SUT VIC AB 4-0 RB1 18 (SUTURE) ×4 IMPLANT
TOWEL GREEN STERILE (TOWEL DISPOSABLE) ×2 IMPLANT
TOWEL GREEN STERILE FF (TOWEL DISPOSABLE) ×2 IMPLANT
TRAY FOLEY MTR SLVR 16FR STAT (SET/KITS/TRAYS/PACK) IMPLANT
TUBING FEATHERFLOW (TUBING) ×2 IMPLANT
WATER STERILE IRR 1000ML POUR (IV SOLUTION) ×2 IMPLANT

## 2023-11-27 NOTE — Op Note (Signed)
 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  Anterior plate fixation [id number]. Surgeon: Barnett Abu First Assistant: Monia Pouch, DO Anesthesia: General Endotracheal Indications: Kaitlyn Flowers is an 83 year old individual whose had progressive weakness in her arms and her legs with unsteadiness of gait.  She has evidence of cord flattening at the levels of C4-5 C5-6 and C6-C7 she has been advised regarding the need for surgical decompression of these areas.  Procedure: The patient was brought to the operating room supine on the stretcher.  After the smooth induction of general tracheal anesthesia and placement of a Foley catheter the patient was placed in 5 pounds of halter traction.  The neck was prepped with alcohol DuraPrep and draped in a sterile fashion.  A transverse incision was made on the left side of the neck in the midportion of the cervical spine and dissected down through the platysma.  The plane between the sternocleidomastoid and strap muscles was then dissected bluntly until the first prevertebral interspace was palpated.  This was marked positively with a needle on radiograph its C4 for C5.  Then by dissecting the longus coli muscle off of either side midline a self-retaining retractor was placed into the wound this was a Agricultural engineer was secured using a Walter arm.  Then a discectomy was performed removing large ventral osteophytes from C4-C5 entering the disc space carefully and using a high-speed drill and a 2 mm dissecting tool to enlarge the disc space and remove significant osteophytic overgrowth.  The bone from this drilling of the interspace was harvested and used for graft.  The dissection was carried out laterally to expose the severely hypertrophied uncinate processes  first on the right and then on the left.  This bone was also harvested.  As the region of the posterior longitudinal ligament was identified a 1 mm then a 2 mm Kerrison punch was used to open and removed thickened redundant ligament in this region thus completing the central canal decompression and dissecting this out to the lateral aspects decompressing the right foramen first and then the left.  Once a thorough decompression was performed the interspace was sized for an appropriately sized spacer and was felt that a modulus C spacer measuring 6 x 15 x 12 mm with 7 degrees lordosis would fit best this was filled with mostly autograft and some allograft bone that was morselized.  This was packed into the interspace the lateral gutters were also packed with a similar bone graft.  Attention was then turned to C5-C6 where the retractor was repositioned and the similar process was carried out again large ventral osteophytes were drilled down bone was harvested and this interspace was decompressed in the same fashion as was C4-5 here however a 5 x 15 x 12 mm spacer with 7 degrees lordosis was placed after being filled with autograft and allograft that was morselized.  At C6-C7 a similar process was carried out and here a 5 x 15 x 12 mm spacer with 7 degrees lordosis was also placed.  Once the interspaces were packed with the graft a 52 mm standard size ACP plate was fitted to the ventral aspect of the vertebral bodies and secured with 6 variable angle 3-1/2 x 15 mm screws.  A final radiograph identified good position of the old of the construct and good decompression of the interspaces with this hemostasis  was carefully checked and the soft tissues all around the cervical spine and when verified the platysma was then closed with 4-0 Vicryl in interrupted fashion and a subcuticular skin closure was performed with 4-0 Vicryl also the patient tolerated procedure well blood loss was estimated at 125 cc.

## 2023-11-27 NOTE — Anesthesia Postprocedure Evaluation (Signed)
 Anesthesia Post Note  Patient: Kaitlyn Flowers  Procedure(s) Performed: ANTERIOR CERVICAL DECOMPRESSION/DISCECTOMY FUSION 3 LEVELS     Patient location during evaluation: PACU Anesthesia Type: General Level of consciousness: awake and alert Pain management: pain level controlled Vital Signs Assessment: post-procedure vital signs reviewed and stable Respiratory status: spontaneous breathing, nonlabored ventilation and respiratory function stable Cardiovascular status: blood pressure returned to baseline and stable Postop Assessment: no apparent nausea or vomiting Anesthetic complications: no  No notable events documented.  Last Vitals:  Vitals:   11/27/23 1200 11/27/23 1304  BP: 134/68 (!) 157/66  Pulse: 70 80  Resp: 13 18  Temp:  36.5 C  SpO2: 100% 93%    Last Pain:  Vitals:   11/27/23 1200  TempSrc:   PainSc: Asleep    LLE Motor Response: Purposeful movement (11/27/23 1312) LLE Sensation: Full sensation (11/27/23 1312) RLE Motor Response: Purposeful movement (11/27/23 1312) RLE Sensation: Full sensation (11/27/23 1312)      Ambika Zettlemoyer,W. EDMOND

## 2023-11-27 NOTE — Plan of Care (Signed)

## 2023-11-27 NOTE — Anesthesia Procedure Notes (Signed)
 Procedure Name: Intubation Date/Time: 11/27/2023 8:01 AM  Performed by: Alwyn Ren, CRNAPre-anesthesia Checklist: Patient identified, Emergency Drugs available, Suction available and Patient being monitored Patient Re-evaluated:Patient Re-evaluated prior to induction Oxygen Delivery Method: Circle system utilized Preoxygenation: Pre-oxygenation with 100% oxygen Induction Type: IV induction Ventilation: Mask ventilation without difficulty Laryngoscope Size: Glidescope and 3 Grade View: Grade I Tube type: Oral Tube size: 7.0 mm Number of attempts: 1 Airway Equipment and Method: Stylet and Oral airway Placement Confirmation: ETT inserted through vocal cords under direct vision, positive ETCO2 and breath sounds checked- equal and bilateral Secured at: 21 cm Tube secured with: Tape Dental Injury: Teeth and Oropharynx as per pre-operative assessment  Comments: Head and neck maintained in position of comfort prior to induction

## 2023-11-27 NOTE — Progress Notes (Signed)
 Orthopedic Tech Progress Note Patient Details:  Kaitlyn Flowers July 17, 1941 [id number]  Soft collar applied to pt in PACU 3 without complication.   Ortho Devices Type of Ortho Device: Soft collar Ortho Device/Splint Location: neck Ortho Device/Splint Interventions: Ordered, Application, Adjustment   Post Interventions Patient Tolerated: Well Instructions Provided: Care of device, Adjustment of device  Hai Grabe Carmine Savoy 11/27/2023, 12:45 PM

## 2023-11-27 NOTE — Transfer of Care (Signed)
 Immediate Anesthesia Transfer of Care Note  Patient: Kaitlyn Flowers  Procedure(s) Performed: ANTERIOR CERVICAL DECOMPRESSION/DISCECTOMY FUSION 3 LEVELS  Patient Location: PACU  Anesthesia Type:General  Level of Consciousness: awake, alert , and oriented  Airway & Oxygen Therapy: Patient Spontanous Breathing and Patient connected to nasal cannula oxygen  Post-op Assessment: Report given to RN and Post -op Vital signs reviewed and stable  Post vital signs: Reviewed and stable  Last Vitals:  Vitals Value Taken Time  BP 158/75 11/27/23 1056  Temp    Pulse 76 11/27/23 1058  Resp 16 11/27/23 1058  SpO2 100 % 11/27/23 1058  Vitals shown include unfiled device data.  Last Pain:  Vitals:   11/27/23 0701  TempSrc:   PainSc: 6       Patients Stated Pain Goal: 1 (11/27/23 0701)  Complications: No notable events documented.

## 2023-11-27 NOTE — H&P (Signed)
 Kaitlyn Flowers is an 83 y.o. female.   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  Patient was having increasing difficulty with her gait unsteadiness on her feet clumsiness in her hands and MRI of the cervical spine demonstrates that she has advanced spondylitic disease with cord compression at C4-5 C5-6 and C6-C7.  She has been advised regarding the need for surgical decompression and arthrodesis.  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   Urinary tract infection    Wears hearing aid     Past Surgical History:  Procedure Laterality Date   ABDOMINAL EXPOSURE N/A 05/17/2019   Procedure: ABDOMINAL EXPOSURE;  Surgeon: Chuck Hint, MD;  Location: [facility] OR;  Service: Vascular;  Laterality: N/A;   ABDOMINAL HYSTERECTOMY  1973   1/2 of ovary left cyst on ovary   ANTERIOR LUMBAR FUSION N/A 05/17/2019   Procedure: Lumbar five Sacral one  Anterior lumbar interbody fusion;  Surgeon: Barnett Abu, MD;  Location: [facility] OR;  Service: Neurosurgery;  Laterality: N/A;   APPENDECTOMY  1973   removed with hysterectomy   COLONOSCOPY     LUMBAR WOUND DEBRIDEMENT  12/06/2011   Procedure:  LUMBAR WOUND DEBRIDEMENT;  Surgeon: Barnett Abu, MD;  Location: [facility] OR[facility];  Service: Neurosurgery;  Laterality: N/A;  Repair of Pseudomeningocele Lumbar wound   shoulder rotator culff  Left 2006   SPINAL CORD STIMULATOR INSERTION  2019   at [facility] and removed 48 hours later due to extreme pain   spinal cord tumor  06/2011   TONSILLECTOMY     As a child    Family History  Problem Relation Age of Onset   Stroke Mother        2010   Heart attack Father    Cancer Paternal Grandmother    Uterine cancer Neg Hx    Bladder Cancer Neg Hx    Social History:  reports that she quit smoking about 56 years ago. Her smoking use included cigarettes. She started smoking about 58 years ago. She has a 0.5 pack-year smoking history. She has never used smokeless tobacco. She reports current alcohol use of about 2.0 - 3.0 standard drinks of alcohol per week. She reports that she does not use drugs.  Allergies:  Allergies  Allergen Reactions   Ibuprofen-Famotidine Other (See Comments)    Elevated BP   Fentanyl Other (See Comments)    Constipates for about 2 weeks   Pregabalin Swelling    AMS, Swelling   Penicillins Rash    Did it involve swelling of the face/tongue/throat,  SOB, or low BP? No Did it involve sudden or severe rash/hives, skin peeling, or any reaction on the inside of your mouth or nose? No Did you need to seek medical attention at a hospital or doctor's office? No When did it last happen?      30+ years If all above answers are "NO", may proceed with cephalosporin use.     Medications Prior to Admission  Medication Sig Dispense Refill   Acetylcysteine (NAC PO) Take 1 capsule by mouth daily.     Ascorbic Acid (VITAMIN C PO) Take 1 tablet by mouth daily.     lamoTRIgine (LAMICTAL) 25 MG tablet Take 2 tablets every evening 180 tablet 3   lidocaine 4 % Place 1 patch onto the skin daily.     MAGNESIUM PO Take 1 tablet by mouth daily.     NON FORMULARY Pt uses a cpap nightly      Omega-3 Fatty Acids (OMEGA 3 PO) Take 1 capsule by mouth daily.     Probiotic Product (PROBIOTIC DAILY PO) Take 1 capsule by mouth daily.     pyridoxine (B-6) 200 MG tablet Take 200 mg by mouth daily.     traMADol (ULTRAM) 50 MG tablet Take 1 tablet (50 mg total) by mouth 2 (two) times daily as needed. (Patient taking differently: Take 50 mg by mouth every 6 (six) hours as needed for moderate pain (pain score 4-6).) 60 tablet 0   TURMERIC PO Take 1 capsule by mouth daily.     estradiol (ESTRACE) 0.1 MG/GM vaginal cream Place 0.5g nightly for two weeks then twice a week after (Patient not taking: Reported on 11/14/2023) 42.5 g 11   NONFORMULARY OR COMPOUNDED ITEM Vitamin E vaginal cream 200u/ml.  One ml pv twice weekly.  Disp. 3 month supply. (Patient not taking: Reported on 11/14/2023) 24 each 3    No results found for this or any previous visit (from the past 48 hours). No results found.  Review of Systems  Constitutional:  Positive for activity change.  Musculoskeletal:  Positive for myalgias, neck pain and neck stiffness.  All other systems reviewed and are negative.   Blood pressure (!) 163/71, pulse 76, temperature 98 F (36.7 C), temperature source Oral, resp. rate 18, height 5' (1.524 m), weight 47.6 kg, SpO2 98%. Physical Exam Constitutional:      Appearance: Normal appearance. She is normal weight.  HENT:     Right Ear: Tympanic membrane, ear canal and external ear normal.     Left Ear: Tympanic membrane, ear canal and external ear normal.     Mouth/Throat:     Mouth: Mucous membranes are moist.     Pharynx: Oropharynx is clear.  Eyes:     Extraocular Movements: Extraocular movements intact.     Conjunctiva/sclera: Conjunctivae normal.     Pupils: Pupils are equal, round, and reactive to light.  Neck:     Comments: Decreased range of motion turning 30 degrees left and right flexion extension limited to 15% of normal axial compression reproduces Lhermitte sign. Skin:     General: Skin is warm and dry.     Capillary Refill: Capillary refill takes less than 2 seconds.  Neurological:     Mental Status: She is alert.     Comments: Cranial nerve examination is normal upper extremity strength reveals 4 out of 5 strength in the deltoids bilaterally 4 out of 5 biceps and grip strength increased tone with moderate spasticity in the small hand muscles particularly  grip strength is 4 out of 5.  Lower extremity strength reveals a foot drop on the right.  Is been a chronic issue.  Psychiatric:        Mood and Affect: Mood normal.        Behavior: Behavior normal.        Thought Content: Thought content normal.        Judgment: Judgment normal.      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  Plan: Anterior decompression C4-5 C5-6 C6-C7 arthrodesis with structural spacer and allograft anterior plate fixation W2-N5.  [name], MD 11/27/2023, 7:38 AM

## 2023-11-28 ENCOUNTER — Encounter (HOSPITAL_COMMUNITY): Payer: Self-pay | Admitting: Neurological Surgery

## 2023-11-28 MED ORDER — OXYCODONE-ACETAMINOPHEN 5-325 MG PO TABS
1.0000 | ORAL_TABLET | ORAL | 0 refills | Status: DC | PRN
Start: 2023-11-28 — End: 2024-05-09

## 2023-11-28 MED ORDER — METHOCARBAMOL 500 MG PO TABS: 500.0000 mg | ORAL_TABLET | Freq: Four times a day (QID) | ORAL | 3 refills | Status: DC | PRN

## 2023-11-28 NOTE — Evaluation (Signed)
 Occupational Therapy Evaluation and Discharge Patient Details Name: Kaitlyn Flowers MRN: [id number] DOB: April 20, 1941 Today's Date: 11/28/2023   History of Present Illness   Pt is a 83 yo female s/p anterior cervical decompression C4-5 C5-6 C6-7 with fusion. PHMx: back pain, OA, neuromuscular disorder (arm neuropathy from cervical spine), leg weakness,seizures, anterior lumbar sx     Clinical Impressions This 83 yo female admitted with above presents to acute OT with PLOF of being independent to Mod I with all basic ADLs and is currently back at her baseline with son helping her prn and to A with IADLs. No further OT needs, we will sign off.     If plan is discharge home, recommend the following:   Assistance with cooking/housework;Assist for transportation     Functional Status Assessment   Patient has had a recent decline in their functional status and demonstrates the ability to make significant improvements in function in a reasonable and predictable amount of time. (without further need for skilled OT)     Equipment Recommendations   None recommended by OT      Precautions/Restrictions   Precautions Precautions: Cervical;Fall Precaution Booklet Issued: Yes (comment) Recall of Precautions/Restrictions: Intact Precaution/Restrictions Comments: Educated on cervical precautions. Handout provided. Required Braces or Orthoses: Cervical Brace Cervical Brace: Soft collar;For comfort Restrictions Weight Bearing Restrictions Per Provider Order: No     Mobility Bed Mobility Overal bed mobility: Modified Independent                  Transfers Overall transfer level: Needs assistance Equipment used: Straight cane Transfers: Sit to/from Stand Sit to Stand: Modified independent (Device/Increase time)                  Balance Overall balance assessment: Mild deficits observed, not formally tested                                          ADL either performed or assessed with clinical judgement   ADL                                         General ADL Comments: Mod I post education from  post surgical neck handout     Vision Baseline Vision/History: 1 Wears glasses Ability to See in Adequate Light: 0 Adequate Patient Visual Report: No change from baseline              Pertinent Vitals/Pain Pain Assessment Pain Assessment: Faces Faces Pain Scale: Hurts a little bit Pain Location: neck Pain Descriptors / Indicators: Discomfort, Sore, Grimacing Pain Intervention(s): Limited activity within patient's tolerance, Monitored during session, Repositioned     Extremity/Trunk Assessment Upper Extremity Assessment Upper Extremity Assessment: Overall WFL for tasks assessed     Communication Communication Communication: No apparent difficulties   Cognition Arousal: Alert   Cognition: No apparent impairments                               Following commands: Intact         General Comments      VSS on RA           Home Living Family/patient expects to be discharged to:: Private residence Living Arrangements: Alone Available Help  at Discharge: Family;Available 24 hours/day (son) Type of Home:  (condo) Home Access: Level entry     Home Layout: One level     Bathroom Shower/Tub: Producer, television/film/video: Standard Bathroom Accessibility: Yes   Home Equipment: Rollator (4 wheels);Cane - single point;Shower seat - built in;Hand held shower head          Prior Functioning/Environment Prior Level of Function : Independent/Modified Independent;Driving             Mobility Comments: rollator vs cane for ambulation ADLs Comments: mod I    OT Problem List: Decreased range of motion;Pain        OT Goals(Current goals can be found in the care plan section)   Acute Rehab OT Goals Patient Stated Goal: to go home today         AM-PAC OT "6  Clicks" Daily Activity     Outcome Measure Help from another person eating meals?: None Help from another person taking care of personal grooming?: None Help from another person toileting, which includes using toliet, bedpan, or urinal?: None Help from another person bathing (including washing, rinsing, drying)?: None Help from another person to put on and taking off regular upper body clothing?: None Help from another person to put on and taking off regular lower body clothing?: None 6 Click Score: 24   End of Session Equipment Utilized During Treatment:  (spc) Nurse Communication:  (no furher OT needs)  Activity Tolerance: Patient tolerated treatment well Patient left: in bed;with call bell/phone within reach  OT Visit Diagnosis: Pain;Unsteadiness on feet (R26.81) (uses SPC) Pain - part of body:  (neck)                Time: 1610-9604 OT Time Calculation (min): 24 min Charges:  OT General Charges $OT Visit: 1 Visit OT Evaluation $OT Eval Moderate Complexity: 1 Mod OT Treatments $Self Care/Home Management : 8-22 mins  Lindon Romp OT Acute Rehabilitation Services Office [phone]    Evette Georges 11/28/2023, 11:20 AM

## 2023-11-28 NOTE — Discharge Instructions (Signed)
 Wound Care Leave incision open to air. You may shower. Do not scrub directly on incision.  Do not put any creams, lotions, or ointments on incision. Activity Walk each and every day, increasing distance each day. No lifting greater than 8 lbs.  Avoid excessive neck motion. No driving for 2 weeks; may ride as a passenger locally. If provided soft collar, may wear for comfort unless otherwise instructed. Diet Resume your normal diet.   Call Your Doctor If Any of These Occur Redness, drainage, or swelling at the wound.  Temperature greater than 101 degrees. Severe pain not relieved by pain medication. Increased difficulty swallowing. Incision starts to come apart. Follow Up Appt Call today for appointment in 3 weeks ([phone]) or for problems.  If you have any hardware placed in your spine, you will need an x-ray before your appointment.

## 2023-11-28 NOTE — Discharge Summary (Signed)
 Physician Discharge Summary  Patient ID: Kaitlyn Flowers MRN: [id number] DOB/AGE: 1941-04-30 83 y.o.  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 Principal Problem:   Cervical myelopathy [facility])   Discharged Condition: good  Hospital Course: Patient tolerated surgery well.  Consults: None  Significant Diagnostic Studies: None  Treatments: surgery: Anterior cervical decompression and arthrodesis C4-5 C5-6 C6-C7  Discharge Exam: Blood pressure (!) 142/64, pulse 83, temperature 98.4 F (36.9 C), temperature source Oral, resp. rate 19, height 5' (1.524 m), weight 47.6 kg, SpO2 99%. Incision is clean and dry Station and gait are intact upper extremity strength seems improved.  Disposition: Discharge disposition: 01-Home or Self Care       Discharge Instructions     Call MD for:  redness, tenderness, or signs of infection (pain, swelling, redness, odor or green/yellow discharge around incision site)   Complete by: As directed    Call MD for:  severe uncontrolled pain   Complete by: As directed    Call MD for:  temperature >100.4   Complete by: As directed    Diet - low sodium heart healthy   Complete by: As directed    Discharge instructions   Complete by: As directed    Okay to shower. Do not apply salves or appointments to incision. No heavy lifting with the upper extremities greater than 10 pounds. May resume driving when not requiring pain medication and patient feels comfortable with doing so.   Incentive spirometry RT   Complete by: As directed    Increase activity slowly   Complete by: As directed       Allergies as of 11/28/2023       Reactions   Ibuprofen-famotidine Other (See Comments)   Elevated BP   Fentanyl Other (See Comments)   Constipates for about 2 weeks   Pregabalin Swelling   AMS,  Swelling   Penicillins Rash   Did it involve swelling of the face/tongue/throat, SOB, or low BP? No Did it involve sudden or severe rash/hives, skin peeling, or any reaction on the inside of your mouth or nose? No Did you need to seek medical attention at a hospital or doctor's office? No When did it last happen?      30+ years If all above answers are "NO", may proceed with cephalosporin use.        Medication List     TAKE these medications    estradiol 0.1 MG/GM vaginal cream Commonly known as: ESTRACE Place 0.5g nightly for two weeks then twice a week after   lamoTRIgine 25 MG tablet Commonly known as: LAMICTAL Take 2 tablets every evening   lidocaine 4 % Place 1 patch onto the skin daily.   MAGNESIUM PO Take 1 tablet by mouth daily.   methocarbamol 500 MG tablet Commonly known as: ROBAXIN Take 1 tablet (500 mg total) by mouth every 6 (six) hours as needed for muscle spasms.   NAC PO Take 1 capsule by mouth daily.   NON FORMULARY Pt uses a cpap nightly   NONFORMULARY OR COMPOUNDED ITEM Vitamin E vaginal cream 200u/ml.  One ml pv twice weekly.  Disp. 3 month supply.   OMEGA 3 PO Take 1 capsule by mouth daily.   oxyCODONE-acetaminophen 5-325 MG tablet Commonly known as: PERCOCET/ROXICET Take 1-2 tablets by mouth every 4 (four) hours as needed for moderate pain (pain score 4-6) or severe pain (pain  score 7-10).   PROBIOTIC DAILY PO Take 1 capsule by mouth daily.   pyridoxine 200 MG tablet Commonly known as: B-6 Take 200 mg by mouth daily.   traMADol 50 MG tablet Commonly known as: ULTRAM Take 1 tablet (50 mg total) by mouth 2 (two) times daily as needed. What changed:  when to take this reasons to take this   TURMERIC PO Take 1 capsule by mouth daily.   VITAMIN C PO Take 1 tablet by mouth daily.         Signed: Shary Key Avielle Imbert 11/28/2023, 8:24 AM

## 2023-11-28 NOTE — Progress Notes (Signed)
 Patient awaiting family for discharge home, Patient in no acute distress nor complaints of pain nor discomfort; incision on hip is clean, dry and intact; No c/o pain at this time. Room was checked and accounted for all patient's belongings; discharge instructions concerning her medications, incision care, follow up appointment and when to call the doctor as needed were all discussed with patient by RN and she expressed understanding on the instructions given.

## 2023-11-28 NOTE — Evaluation (Signed)
 Physical Therapy Evaluation Patient Details Name: Kaitlyn Flowers MRN: [id number] DOB: 1940/10/30 Today's Date: 11/28/2023  History of Present Illness  Pt is a 83 yo female s/p anterior cervical decompression C4-5 C5-6 C6-7 with fusion. PHMx: back pain, OA, neuromuscular disorder (arm neuropathy from cervical spine), leg weakness,seizures, anterior lumbar sx  Clinical Impression  PT eval complete. PTA pt mod I with cane vs rollator. Son will be staying with pt upon d/c. On eval, she demo mod I bed mobility. Supervision transfers and ambulation 500' with cane. All education complete. No further skilled PT intervention indicated. PT signing off.        If plan is discharge home, recommend the following: Assist for transportation;Assistance with cooking/housework   Can travel by private vehicle        Equipment Recommendations None recommended by PT  Recommendations for Other Services       Functional Status Assessment Patient has had a recent decline in their functional status and demonstrates the ability to make significant improvements in function in a reasonable and predictable amount of time.     Precautions / Restrictions Precautions Precautions: Cervical;Fall Recall of Precautions/Restrictions: Intact Precaution/Restrictions Comments: Educated on cervical precautions. Handout provided. Required Braces or Orthoses: Cervical Brace Cervical Brace: Soft collar;For comfort      Mobility  Bed Mobility Overal bed mobility: Modified Independent             General bed mobility comments: HOB elevated, +rails    Transfers Overall transfer level: Needs assistance Equipment used: Straight cane Transfers: Sit to/from Stand Sit to Stand: Supervision                Ambulation/Gait Ambulation/Gait assistance: Supervision Gait Distance (Feet): 500 Feet Assistive device: Straight cane Gait Pattern/deviations: Steppage, Wide base of support, Step-through pattern,  Decreased stride length Gait velocity: WFL Gait velocity interpretation: >2.62 ft/sec, indicative of community ambulatory   General Gait Details: steppage gait on R due to chronic foot drop  Stairs            Wheelchair Mobility     Tilt Bed    Modified Rankin (Stroke Patients Only)       Balance Overall balance assessment: Mild deficits observed, not formally tested                                           Pertinent Vitals/Pain Pain Assessment Pain Assessment: Faces Faces Pain Scale: Hurts little more Pain Location: neck Pain Descriptors / Indicators: Discomfort, Sore, Grimacing Pain Intervention(s): Monitored during session    Home Living Family/patient expects to be discharged to:: Private residence Living Arrangements: Alone Available Help at Discharge: Family;Available 24 hours/day (son will be staying with pt upon d/c) Type of Home: Other(Comment) (condo) Home Access: Level entry       Home Layout: One level Home Equipment: Rollator (4 wheels);Cane - single point;Shower seat - built in;Hand held shower head      Prior Function Prior Level of Function : Independent/Modified Independent;Driving             Mobility Comments: rollator vs cane for ambulation ADLs Comments: mod I     Extremity/Trunk Assessment   Upper Extremity Assessment Upper Extremity Assessment: Defer to OT evaluation    Lower Extremity Assessment Lower Extremity Assessment: RLE deficits/detail RLE Deficits / Details: chronic foot drop    Cervical / Trunk Assessment Cervical /  Trunk Assessment: Other exceptions;Neck Surgery Cervical / Trunk Exceptions: scoliosis, h/o lumbar sx  Communication   Communication Communication: No apparent difficulties    Cognition Arousal: Alert Behavior During Therapy: WFL for tasks assessed/performed   PT - Cognitive impairments: No apparent impairments                         Following commands:  Intact       Cueing       General Comments General comments (skin integrity, edema, etc.): VSS on RA    Exercises     Assessment/Plan    PT Assessment Patient does not need any further PT services  PT Problem List         PT Treatment Interventions      PT Goals (Current goals can be found in the Care Plan section)  Acute Rehab PT Goals Patient Stated Goal: home today PT Goal Formulation: All assessment and education complete, DC therapy    Frequency       Co-evaluation               AM-PAC PT "6 Clicks" Mobility  Outcome Measure Help needed turning from your back to your side while in a flat bed without using bedrails?: None Help needed moving from lying on your back to sitting on the side of a flat bed without using bedrails?: None Help needed moving to and from a bed to a chair (including a wheelchair)?: A Little Help needed standing up from a chair using your arms (e.g., wheelchair or bedside chair)?: A Little Help needed to walk in hospital room?: A Little Help needed climbing 3-5 steps with a railing? : A Little 6 Click Score: 20    End of Session Equipment Utilized During Treatment: Cervical collar;Gait belt Activity Tolerance: Patient tolerated treatment well Patient left: in bed;with call bell/phone within reach Nurse Communication: Mobility status PT Visit Diagnosis: Difficulty in walking, not elsewhere classified (R26.2)    Time: 1610-9604 PT Time Calculation (min) (ACUTE ONLY): 20 min   Charges:   PT Evaluation $PT Eval Moderate Complexity: 1 Mod   PT General Charges $$ ACUTE PT VISIT: 1 Visit         Ferd Glassing., PT  Office # [phone]   Ilda Foil 11/28/2023, 8:16 AM

## 2023-12-02 NOTE — Telephone Encounter (Signed)
 Medical Buy and Bill  Prior Authorization REQUIRED  PA PROCESS DETAILS: PA is required. PA can be initiated by calling [phone] or online at [email or web address].

## 2023-12-07 NOTE — Telephone Encounter (Signed)
 Prior Authorization initiated for PROLIA via [email or web address] KEY: [id number]

## 2023-12-11 NOTE — Telephone Encounter (Signed)
 Medical Buy and Annette Stable  Prior Authorization for [employer] APPROVED PA# [id number] Valid: 11/12/19-09/01/24

## 2023-12-15 DIAGNOSIS — G4733 Obstructive sleep apnea (adult) (pediatric): Secondary | ICD-10-CM | POA: Diagnosis not present

## 2023-12-17 NOTE — Telephone Encounter (Signed)
 Medical Buy and Bill  Pt ready for scheduling on or after 12/20/23  Out-of-pocket cost due at time of visit: $40  Primary: [employer] PPO Prolia co-insurance: $40 Admin fee co-insurance: 0%  Deductible: does not apply  Prior Auth: APPROVED PA# [id number] Valid: 11/12/19-09/01/24  Secondary: N/A Prolia co-insurance:  Admin fee co-insurance:  Deductible:  Prior Auth:  PA# Valid:     ** This summary of benefits is an estimation of the patient's out-of-pocket cost. Exact cost may very based on individual plan coverage.

## 2023-12-23 ENCOUNTER — Ambulatory Visit: Payer: Medicare PPO

## 2023-12-23 DIAGNOSIS — M810 Age-related osteoporosis without current pathological fracture: Secondary | ICD-10-CM

## 2023-12-23 MED ORDER — DENOSUMAB 60 MG/ML ~~LOC~~ SOSY
60.0000 mg | PREFILLED_SYRINGE | Freq: Once | SUBCUTANEOUS | Status: AC
  Administered 2024-06-25: 60 mg via SUBCUTANEOUS

## 2023-12-23 MED ORDER — DENOSUMAB 60 MG/ML ~~LOC~~ SOSY
60.0000 mg | PREFILLED_SYRINGE | Freq: Once | SUBCUTANEOUS | Status: AC
  Administered 2023-12-23: 60 mg via SUBCUTANEOUS

## 2023-12-23 NOTE — Progress Notes (Signed)
After obtaining consent, and per orders of Dr. Elvera Lennox, injection of Prolia given by Pollie Meyer. Patient instructed to remain in clinic for 20 minutes afterwards, and to report any adverse reaction to me immediately.

## 2023-12-24 DIAGNOSIS — M5412 Radiculopathy, cervical region: Secondary | ICD-10-CM | POA: Diagnosis not present

## 2023-12-24 DIAGNOSIS — G5601 Carpal tunnel syndrome, right upper limb: Secondary | ICD-10-CM | POA: Diagnosis not present

## 2023-12-31 ENCOUNTER — Encounter: Payer: Self-pay | Admitting: Obstetrics and Gynecology

## 2023-12-31 ENCOUNTER — Telehealth: Payer: Self-pay

## 2023-12-31 NOTE — Telephone Encounter (Signed)
 Patient has been scheduled for 01-01-2024 with Dr Frutoso Jing @ 11:40am.

## 2023-12-31 NOTE — Telephone Encounter (Signed)
 Kaitlyn Flowers is a 83 y.o. female called in because she said she has not been able to urinat since yesterday. Pt said she has catheters but has not cath ed because she wanted to have urine to leave a sample if she need to. Pt was encouraged to cath. I told the pt I would notify Dr. Frutoso Jing and I will call her back with a plan. Pt agreed.

## 2024-01-01 ENCOUNTER — Ambulatory Visit: Admitting: Obstetrics and Gynecology

## 2024-01-01 ENCOUNTER — Other Ambulatory Visit (HOSPITAL_COMMUNITY)
Admission: RE | Admit: 2024-01-01 | Discharge: 2024-01-01 | Disposition: A | Discharge status: Home / Self Care | Source: Ambulatory Visit | Attending: Obstetrics and Gynecology | Admitting: Obstetrics and Gynecology

## 2024-01-01 ENCOUNTER — Encounter: Payer: Self-pay | Admitting: Obstetrics and Gynecology

## 2024-01-01 ENCOUNTER — Telehealth (INDEPENDENT_AMBULATORY_CARE_PROVIDER_SITE_OTHER): Admitting: Internal Medicine

## 2024-01-01 VITALS — BP 163/84 | HR 71 | Ht 60.0 in | Wt 103.0 lb

## 2024-01-01 VITALS — BP 169/76 | HR 71

## 2024-01-01 DIAGNOSIS — R82998 Other abnormal findings in urine: Secondary | ICD-10-CM

## 2024-01-01 DIAGNOSIS — N390 Urinary tract infection, site not specified: Secondary | ICD-10-CM | POA: Diagnosis not present

## 2024-01-01 DIAGNOSIS — R350 Frequency of micturition: Secondary | ICD-10-CM | POA: Diagnosis not present

## 2024-01-01 DIAGNOSIS — R6889 Other general symptoms and signs: Secondary | ICD-10-CM

## 2024-01-01 DIAGNOSIS — R319 Hematuria, unspecified: Secondary | ICD-10-CM

## 2024-01-01 LAB — URINALYSIS, ROUTINE W REFLEX MICROSCOPIC
Bilirubin Urine: NEGATIVE
Glucose, UA: NEGATIVE mg/dL
Hgb urine dipstick: NEGATIVE
Ketones, ur: NEGATIVE mg/dL
Nitrite: POSITIVE — AB
Protein, ur: NEGATIVE mg/dL
Specific Gravity, Urine: 1.011 (ref 1.005–1.030)
WBC, UA: >50 WBC/hpf (ref 0–5)
pH: 6 (ref 5.0–8.0)

## 2024-01-01 LAB — POCT URINALYSIS DIPSTICK
Bilirubin, UA: NEGATIVE
Glucose, UA: NEGATIVE
Ketones, UA: NEGATIVE
Nitrite, UA: POSITIVE
Protein, UA: NEGATIVE
Spec Grav, UA: 1.01 (ref 1.010–1.025)
Urobilinogen, UA: 0.2 U/dL
pH, UA: 5.5 (ref 5.0–8.0)

## 2024-01-01 MED ORDER — SULFAMETHOXAZOLE-TRIMETHOPRIM 800-160 MG PO TABS: 1.0000 | ORAL_TABLET | Freq: Two times a day (BID) | ORAL | 0 refills | Status: AC

## 2024-01-01 NOTE — Progress Notes (Signed)
 Patient checked in for visit but clinic backed up  were unable to begin until 11 20 so will cancel and reshcedule As she has a urology appt important to get to at 11 40.  Voice is slight raspy  nl resp   Advised stop all supplements that are not absoluutely necessary   for swallowing until better and reschedule appt.   Virtual Visit via Video Note  I connected with Kaitlyn Flowers on 01/01/24 at 11:00 AM EDT by a video enabled telemedicine application and verified that I am speaking with the correct person using two identifiers. Location patient: home Location provider:work office Persons participating in the virtual visit: patient, provider   Patient aware  of the limitations of evaluation and management by telemedicine and  availability of in person appointments. and agreed to proceed.   HPI: Kaitlyn Flowers presents for video visit   Current Outpatient Medications:    Acetylcysteine (NAC PO), Take 1 capsule by mouth daily., Disp: , Rfl:    Ascorbic Acid (VITAMIN C PO), Take 1 tablet by mouth daily., Disp: , Rfl:    lamoTRIgine  (LAMICTAL ) 25 MG tablet, Take 2 tablets every evening, Disp: 180 tablet, Rfl: 3   lidocaine  4 %, Place 1 patch onto the skin daily., Disp: , Rfl:    MAGNESIUM PO, Take 1 tablet by mouth daily., Disp: , Rfl:    methocarbamol  (ROBAXIN ) 500 MG tablet, Take 1 tablet (500 mg total) by mouth every 6 (six) hours as needed for muscle spasms., Disp: 30 tablet, Rfl: 3   NON FORMULARY, Pt uses a cpap nightly, Disp: , Rfl:    Omega-3 Fatty Acids (OMEGA 3 PO), Take 1 capsule by mouth daily., Disp: , Rfl:    oxyCODONE -acetaminophen  (PERCOCET/ROXICET) 5-325 MG tablet, Take 1-2 tablets by mouth every 4 (four) hours as needed for moderate pain (pain score 4-6) or severe pain (pain score 7-10)., Disp: 40 tablet, Rfl: 0   Probiotic Product (PROBIOTIC DAILY PO), Take 1 capsule by mouth daily., Disp: , Rfl:    pyridoxine (B-6) 200 MG tablet, Take 200 mg by mouth daily., Disp:  , Rfl:    traMADol  (ULTRAM ) 50 MG tablet, Take 1 tablet (50 mg total) by mouth 2 (two) times daily as needed. (Patient taking differently: Take 50 mg by mouth every 6 (six) hours as needed for moderate pain (pain score 4-6).), Disp: 60 tablet, Rfl: 0   TURMERIC PO, Take 1 capsule by mouth daily., Disp: , Rfl:    estradiol  (ESTRACE ) 0.1 MG/GM vaginal cream, Place 0.5g nightly for two weeks then twice a week after (Patient not taking: Reported on 11/14/2023), Disp: 42.5 g, Rfl: 11   NONFORMULARY OR COMPOUNDED ITEM, Vitamin E vaginal cream 200u/ml.  One ml pv twice weekly.  Disp. 3 month supply. (Patient not taking: Reported on 11/14/2023), Disp: 24 each, Rfl: 3  Current Facility-Administered Medications:    [START ON 06/23/2024] denosumab  (PROLIA ) injection 60 mg, 60 mg, Subcutaneous, Once, Emilie Harden, MD  EXAM: BP Readings from Last 3 Encounters:  01/01/24 (!) 163/84  11/28/23 (!) 142/64  11/20/23 (!) 148/71    VITALS per patient if applicable:  GENERAL: alert, oriented, appears well and in no acute distress  HEENT: atraumatic, conjunttiva clear, no obvious abnormalities on inspection of external nose and ears  NECK: normal movements of the head and neck   PSYCH/NEURO: pleasant and cooperative, no obvious depression or anxiety, speech and thought processing grossly intact   ASSESSMENT AND PLAN:  Discussed the following assessment and  plan:    ICD-10-CM   1. Throat symptom  R68.89      Will reschedule visit    Daphine Eagle, MD

## 2024-01-01 NOTE — Progress Notes (Signed)
 [facility] Return Visit  SUBJECTIVE  History of Present Illness: Kaitlyn Flowers is a 83 y.o. female presenting for incomplete bladder emptying.   Yesterday she was unable to void for several hours and catheterized. She obtained 4oz of urine with the cath. She has voided small amounts since then and catheterized two other times, including this morning. She feels like she is hydrating well. Also seems to be leaking large amounts. Had cervical spine surgery a few weeks ago. Was catheterized during the surgery. She stopped using the vaginal estrogen before the surgery since she was not sure she wanted to continue to use it. Currently has pessary in place. Denies vaginal bleeding.   Past Medical History: Patient  has a past medical history of Anemia, Arthritis, Back pain, Cancer (HCC), Leg weakness, Neuromuscular disorder (HCC), Osteopenia, PONV (postoperative nausea and vomiting), Right leg pain, Rotator cuff tear, Seizures (HCC), Sleep apnea, Spinal cord tumor, Urinary tract infection, and Wears hearing aid.   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   Medications: She has a current medication list which includes the following prescription(s): sulfamethoxazole -trimethoprim , acetylcysteine, ascorbic acid, estradiol , lamotrigine , lidocaine , magnesium, methocarbamol , NON FORMULARY, NONFORMULARY OR COMPOUNDED ITEM, omega-3 fatty acids, oxycodone -acetaminophen , probiotic product, pyridoxine, tramadol , and turmeric, and the following Facility-Administered Medications: [START ON 06/23/2024]  denosumab .   Allergies: Patient is allergic to ibuprofen -famotidine , pregabalin , and penicillins.   Social History: Patient  reports that she quit smoking about 56 years ago. Her smoking use included cigarettes. She started smoking about 58 years ago. She has a 0.5 pack-year smoking history. She has never used smokeless tobacco. She reports current alcohol use of about 2.0 - 3.0 standard drinks of alcohol per week. She reports that she does not use drugs.     OBJECTIVE     Physical Exam: Vitals:   01/01/24 1147 01/01/24 1157  BP: (!) 165/76 (!) 169/76  Pulse: 85 71   Gen: No apparent distress, A&O x 3.  Detailed Urogynecologic Evaluation:  Caruncle present on urethra. Urethra was prepped with betadine and straight catheter placed. Dark urine with sediment obtained.   Pessary removed and cleaned. Speculum reveals no vaginal lesions with normal mucosa. Pessary replaced- it was comfortable and fit well.     Results for orders placed or performed in visit on 01/01/24  POCT Urinalysis Dipstick   Collection Time: 01/01/24 12:18 PM  Result Value Ref Range   Color, UA Light Yellow    Clarity, UA Cloudy    Glucose, UA Negative Negative   Bilirubin, UA Negative    Ketones, UA Negative    Spec Grav, UA 1.010 1.010 - 1.025   Blood, UA Trace-Iysed    pH, UA 5.5 5.0 - 8.0   Protein, UA Negative Negative   Urobilinogen, UA 0.2 0.2 or 1.0 E.U./dL   Nitrite, UA Positive    Leukocytes, UA Large (3+) (A) Negative   Appearance     Odor      ASSESSMENT AND PLAN    [name] is a 83 y.o. with:  1. Acute UTI   2. Leukocytes in urine   3. Urinary frequency   4. Hematuria, unspecified type     - POC urine positive today. Will start bactrim   DS BID x3 days and send culture. This could be the cause of her symptoms.  - She feels the voiding has improved today, but will cath as needed. Suspect incomplete emptying is due to UTI.  - Planning for surgery soon but not yet scheduled- will  message scheduler. She will return for pre op.    Arma Lamp, MD

## 2024-01-02 NOTE — Telephone Encounter (Signed)
 Last Prolia  inj 12/23/23 Next Prolia  inj due 06/24/24

## 2024-01-03 LAB — URINE CULTURE: Culture: >=100000 — AB

## 2024-01-05 ENCOUNTER — Encounter: Payer: Self-pay | Admitting: Obstetrics and Gynecology

## 2024-01-06 ENCOUNTER — Telehealth: Admitting: Internal Medicine

## 2024-01-06 ENCOUNTER — Encounter: Payer: Self-pay | Admitting: Internal Medicine

## 2024-01-06 VITALS — Ht 60.0 in | Wt 103.0 lb

## 2024-01-06 DIAGNOSIS — K219 Gastro-esophageal reflux disease without esophagitis: Secondary | ICD-10-CM

## 2024-01-06 DIAGNOSIS — R079 Chest pain, unspecified: Secondary | ICD-10-CM | POA: Diagnosis not present

## 2024-01-06 NOTE — Progress Notes (Signed)
 Virtual Visit via Video Note  I connected with Kaitlyn Flowers on 01/06/24 at  3:30 PM EDT by a video enabled telemedicine application and verified that I am speaking with the correct person using two identifiers. Location patient: home Location provider:work office Persons participating in the virtual visit: patient, provider   Patient aware  of the limitations of evaluation and management by telemedicine and  availability of in person appointments. and agreed to proceed.   HPI: Kaitlyn Flowers presents for video visit  see last note  visit no t completed   cause of other appts  soe rescheduled    Had nsaid  on going  before  srugery  pre  op  did mri  of neck and had cervical decompression   3 27  still   has some pain and arm possible  CTS  oxycodone   post  op.   But burning with all going down with swallowing  hap pended before surgery and suspects from nsaids  To have ncs next month  With any food  any food  raw  and burtning feeling  on the  way down . Worse later in day  no acid brash  No vomiting  No c or d    Took tagamet   some help .  200  once  A bit  pos  post surgery .  Currently under rx for uti with septra  bactrim   getting better    Will have colonoscopy and fu in future  Will be having a bladder surgery cystocele repair   in near future   ROS: See pertinent positives and negatives per HPI.  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   Urinary tract infection     Wears hearing aid     Past Surgical History:  Procedure Laterality Date   ABDOMINAL EXPOSURE N/A 05/17/2019   Procedure: ABDOMINAL EXPOSURE;  Surgeon: Dannis Dy, MD;  Location: [facility] OR;  Service: Vascular;  Laterality: N/A;   ABDOMINAL HYSTERECTOMY  1973   1/2 of ovary left cyst on ovary   ANTERIOR CERVICAL DECOMP/DISCECTOMY FUSION N/A 11/27/2023   Procedure: ANTERIOR CERVICAL DECOMPRESSION/DISCECTOMY FUSION 3 LEVELS;  Surgeon: Elna Haggis, MD;  Location: [facility] OR;  Service: Neurosurgery;  Laterality: N/A;  Anterior Cervical Discectomy Fusion - Cervical Four-Cervical Five - Cervical Five-Cervical Six - Cervical Six-Cervical Seven   ANTERIOR LUMBAR FUSION N/A 05/17/2019   Procedure: Lumbar five Sacral one  Anterior lumbar interbody fusion;  Surgeon: Elna Haggis, MD;  Location: [facility] OR;  Service: Neurosurgery;  Laterality: N/A;   APPENDECTOMY  1973   removed with hysterectomy   COLONOSCOPY     LUMBAR WOUND DEBRIDEMENT  12/06/2011   Procedure: LUMBAR WOUND DEBRIDEMENT;  Surgeon: Elna Haggis, MD;  Location: [facility] ORS;  Service: Neurosurgery;  Laterality: N/A;  Repair of Pseudomeningocele  Lumbar wound   shoulder rotator culff  Left 2006   SPINAL CORD STIMULATOR INSERTION  2019   at [facility] and removed 48 hours later due to extreme pain   spinal cord tumor  06/2011   TONSILLECTOMY     As a child    Family History  Problem Relation Age of Onset   Stroke Mother        2010   Heart attack Father    Cancer Paternal Grandmother    Uterine cancer Neg Hx    Bladder Cancer Neg Hx     Social History   Tobacco Use   Smoking status: Former    Current packs/day: 0.00    Average packs/day: 0.3 packs/day for 2.0 years (0.5 ttl pk-yrs)    Types: Cigarettes    Start date: 09/02/1965    Quit date: 09/03/1967    Years since quitting: 56.3   Smokeless tobacco: Never   Tobacco comments:    quit 48 years   Vaping Use   Vaping status: Never Used  Substance Use Topics   Alcohol use: Yes     Alcohol/week: 2.0 - 3.0 standard drinks of alcohol    Types: 2 - 3 Glasses of wine per week   Drug use: No      Current Outpatient Medications:    Acetylcysteine (NAC PO), Take 1 capsule by mouth daily., Disp: , Rfl:    Ascorbic Acid (VITAMIN C PO), Take 1 tablet by mouth daily., Disp: , Rfl:    estradiol  (ESTRACE ) 0.1 MG/GM vaginal cream, Place 0.5g nightly for two weeks then twice a week after, Disp: 42.5 g, Rfl: 11   lamoTRIgine  (LAMICTAL ) 25 MG tablet, Take 2 tablets every evening, Disp: 180 tablet, Rfl: 3   lidocaine  4 %, Place 1 patch onto the skin daily., Disp: , Rfl:    MAGNESIUM PO, Take 1 tablet by mouth daily., Disp: , Rfl:    methocarbamol  (ROBAXIN ) 500 MG tablet, Take 1 tablet (500 mg total) by mouth every 6 (six) hours as needed for muscle spasms., Disp: 30 tablet, Rfl: 3   NON FORMULARY, Pt uses a cpap nightly, Disp: , Rfl:    Omega-3 Fatty Acids (OMEGA 3 PO), Take 1 capsule by mouth daily., Disp: , Rfl:    Probiotic Product (PROBIOTIC DAILY PO), Take 1 capsule by mouth daily., Disp: , Rfl:    pyridoxine (B-6) 200 MG tablet, Take 200 mg by mouth daily., Disp: , Rfl:    TURMERIC PO, Take 1 capsule by mouth daily., Disp: , Rfl:    oxyCODONE -acetaminophen  (PERCOCET/ROXICET) 5-325 MG tablet, Take 1-2 tablets by mouth every 4 (four) hours as needed for moderate pain (pain score 4-6) or severe pain (pain score 7-10). (Patient not taking: Reported on 01/06/2024), Disp: 40 tablet, Rfl: 0   traMADol  (ULTRAM ) 50 MG tablet, Take 1 tablet (50 mg total) by mouth 2 (two) times daily as needed. (Patient taking differently: Take 50 mg by mouth every 6 (six) hours as needed for moderate pain (pain score 4-6).), Disp: 60 tablet, Rfl: 0  Current Facility-Administered Medications:    [START ON 06/23/2024] denosumab  (PROLIA ) injection 60 mg, 60 mg, Subcutaneous, Once, Emilie Harden, MD  EXAM: BP Readings from Last 3 Encounters:  01/01/24 (!) 169/76  01/01/24 (!) 163/84  11/28/23 (!)  142/64    VITALS per patient if applicable:  GENERAL: alert, oriented, appears well and in no acute distress nl voice and speech   HEENT: atraumatic, conjunttiva clear, no obvious abnormalities on  inspection of external nose and ears  NECK: normal movements of the head and neck  LUNGS: on inspection no signs of respiratory distress, breathing rate appears normal, no obvious gross SOB, gasping or wheezing  CV: no obvious cyanosis  PSYCH/NEURO: pleasant and cooperative, no obvious depression or anxiety, speech and thought processing grossly intact Lab Results  Component Value Date   WBC 7.3 11/20/2023   HGB 10.5 (L) 11/20/2023   HCT 31.9 (L) 11/20/2023   PLT 220 11/20/2023   GLUCOSE 87 11/20/2023   CHOL 224 (H) 03/22/2022   TRIG 60.0 03/22/2022   HDL 88.70 03/22/2022   LDLDIRECT 110.1 11/27/2010   LDLCALC 124 (H) 03/22/2022   ALT 16 09/13/2022   AST 23 09/13/2022   NA 138 11/20/2023   K 4.7 11/20/2023   CL 104 11/20/2023   CREATININE 0.82 11/20/2023   BUN 20 11/20/2023   CO2 27 11/20/2023   TSH 1.42 03/22/2022   INR 1.01 03/25/2016   HGBA1C 5.7 03/22/2022    ASSESSMENT AND PLAN:  Discussed the following assessment and plan:    ICD-10-CM   1. Chest pain due to GERD burning  K21.9    R07.9    with swallowing    Sounds like esophageal burning pain  poss suspected  from prev nsaid use  but no obstruction sx .  Add ppi prilosec or nexium otc  every day for 2 weeks and  then send in message or fu appt    may have her gi team see her before  she gets her routine colon procedure .  Under rx for UTI  Counseled.   Expectant management and discussion of plan and treatment with opportunity to ask questions and all were answered. The patient agreed with the plan and demonstrated an understanding of the instructions.   Advised to call back or seek an in-person evaluation if worsening  or having  further concerns  in interim. Return if symptoms worsen or fail to improve,  for and send in message about status in about 2 weeks fu as indicated .    Daphine Eagle, MD

## 2024-01-07 DIAGNOSIS — G5601 Carpal tunnel syndrome, right upper limb: Secondary | ICD-10-CM | POA: Diagnosis not present

## 2024-01-14 DIAGNOSIS — G4733 Obstructive sleep apnea (adult) (pediatric): Secondary | ICD-10-CM | POA: Diagnosis not present

## 2024-01-16 ENCOUNTER — Other Ambulatory Visit: Payer: Self-pay

## 2024-01-16 DIAGNOSIS — R202 Paresthesia of skin: Secondary | ICD-10-CM

## 2024-01-20 DIAGNOSIS — G4733 Obstructive sleep apnea (adult) (pediatric): Secondary | ICD-10-CM | POA: Diagnosis not present

## 2024-01-22 ENCOUNTER — Ambulatory Visit: Admitting: Neurology

## 2024-01-22 DIAGNOSIS — R202 Paresthesia of skin: Secondary | ICD-10-CM

## 2024-01-22 DIAGNOSIS — G5601 Carpal tunnel syndrome, right upper limb: Secondary | ICD-10-CM

## 2024-01-22 DIAGNOSIS — G5621 Lesion of ulnar nerve, right upper limb: Secondary | ICD-10-CM

## 2024-01-22 NOTE — Procedures (Signed)
  [facility]  [address], [address]  [place], [place] [address] Tel: [phone] Fax: [phone] Test Date:  01/22/2024  Patient: Kaitlyn Flowers DOB: Jan 31, 1941 Physician: Reyna Cava, DO  Sex: Female Height: 5\' 0"  Ref Phys: Elna Haggis, MD  ID#: [id number]   Technician:    History: This is a 83 year old female with s/p cervical decompression referred for evaluation of right hand paresthesias.  NCV & EMG Findings: Extensive electrodiagnostic testing of the right upper extremity shows:  Right median and ulnar sensory responses show prolonged latency latency (R4.1, R3.8 ms).  Right radial sensory response is within normal limits. Right median motor response shows prolonged latency (4.3 ms).  Right ulnar motor response shows prolonged latency (3.4 ms), reduced amplitude (4.0 mV), and decreased conduction velocity (A Elbow-B Elbow, 45 m/s).   Chronic motor axonal loss changes are seen affecting the ulnar innervated muscles, without accompanying active denervation.    Impression: Right ulnar neuropathy with slowing across the elbow, with demyelinating and axonal features, moderate. Right median neuropathy at or distal to the wrist, consistent with a clinical diagnosis of carpal tunnel syndrome.  Overall, these findings are mild-to-moderate in degree electrically.   ___________________________ Reyna Cava, DO    Nerve Conduction Studies   Stim Site NR Peak (ms) Norm Peak (ms) O-P Amp (V) Norm O-P Amp  Right Median Anti Sensory (2nd Digit)  32 C  Wrist    *4.1 <3.8 27.8 >10  Right Radial Anti Sensory (Base 1st Digit)  32 C  Wrist    2.8 <2.8 34.7 >10  Right Ulnar Anti Sensory (5th Digit)  32 C  Wrist    *3.8 <3.2 30.7 >5     Stim Site NR Onset (ms) Norm Onset (ms) O-P Amp (mV) Norm O-P Amp Site1 Site2 Delta-0 (ms) Dist (cm) Vel (m/s) Norm Vel (m/s)  Right Median Motor (Abd Poll Brev)  32 C  Wrist    *4.3 <4.0 5.2 >5 Elbow Wrist 5.2 28.0 54 >50  Elbow     9.5  4.8         Right Ulnar Motor (Abd Dig Minimi)  32 C  Wrist    *3.4 <3.1 *4.0 >7 B Elbow Wrist 3.9 20.0 51 >50  B Elbow    7.3  3.5  A Elbow B Elbow 2.2 10.0 *45 >50  A Elbow    9.5  3.5          Electromyography   Side Muscle Ins.Act Fibs Fasc Recrt Amp Dur Poly Activation Comment  Right 1stDorInt Nml Nml Nml *3- *1+ *1+ *1+ Nml N/A  Right Abd Poll Brev Nml Nml Nml Nml Nml Nml Nml Nml N/A  Right PronatorTeres Nml Nml Nml Nml Nml Nml Nml Nml N/A  Right Biceps Nml Nml Nml Nml Nml Nml Nml Nml N/A  Right Triceps Nml Nml Nml Nml Nml Nml Nml Nml N/A  Right Deltoid Nml Nml Nml Nml Nml Nml Nml Nml N/A  Right Abd Dig Min Nml Nml Nml *3- *1+ *1+ *1+ Nml N/A  Right FlexCarpiUln Nml Nml Nml Nml Nml Nml Nml Nml N/A      Waveforms:

## 2024-01-23 ENCOUNTER — Ambulatory Visit (INDEPENDENT_AMBULATORY_CARE_PROVIDER_SITE_OTHER): Admitting: Obstetrics and Gynecology

## 2024-01-23 ENCOUNTER — Encounter: Payer: Self-pay | Admitting: Obstetrics and Gynecology

## 2024-01-23 ENCOUNTER — Other Ambulatory Visit: Payer: Self-pay | Admitting: Obstetrics and Gynecology

## 2024-01-23 VITALS — BP 152/83 | HR 80 | Ht 58.66 in | Wt 104.0 lb

## 2024-01-23 DIAGNOSIS — Z01818 Encounter for other preprocedural examination: Secondary | ICD-10-CM

## 2024-01-23 DIAGNOSIS — N393 Stress incontinence (female) (male): Secondary | ICD-10-CM

## 2024-01-23 DIAGNOSIS — N8110 Cystocele, unspecified: Secondary | ICD-10-CM

## 2024-01-23 MED ORDER — OXYCODONE HCL 5 MG PO TABS
5.0000 mg | ORAL_TABLET | ORAL | 0 refills | Status: DC | PRN
Start: 2024-01-23 — End: 2024-05-09

## 2024-01-23 MED ORDER — PREDNISONE 20 MG PO TABS: 20.0000 mg | ORAL_TABLET | Freq: Every day | ORAL | 0 refills | Status: DC

## 2024-01-23 MED ORDER — POLYETHYLENE GLYCOL 3350 17 GM/SCOOP PO POWD: 17.0000 g | Freq: Every day | ORAL | 0 refills | Status: DC

## 2024-01-23 MED ORDER — ACETAMINOPHEN 500 MG PO TABS: 500.0000 mg | ORAL_TABLET | Freq: Four times a day (QID) | ORAL | 0 refills | Status: DC | PRN

## 2024-01-23 NOTE — H&P (Signed)
 [facility] H&P  Subjective Chief Complaint: Kaitlyn Flowers presents for a preoperative encounter.   History of Present Illness: Kaitlyn Flowers is a 83 y.o. female who presents for preoperative visit.  She is scheduled to undergo  Exam under anesthesia, anterior repair with sacrospinous fixation, cystoscopy, urethral bulking, possible posterior repair  on 02/16/24.  Her symptoms include pelvic organ prolapse and stress urinary incontinence, and she was was found to have Stage II anterior, Stage I posterior, Stage I apical prolapse.   Urodynamics showed: 1. Sensation was normal; capacity was normal 2. Stress Incontinence was demonstrated at ISD range pressures; 3. Detrusor Overactivity was not demonstrated. 4. 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   Urinary tract infection    Wears hearing aid      Past Surgical History:  Procedure Laterality Date   ABDOMINAL EXPOSURE N/A 05/17/2019   Procedure: ABDOMINAL EXPOSURE;  Surgeon: Dannis Dy, MD;  Location: [facility] OR;  Service: Vascular;  Laterality: N/A;   ABDOMINAL HYSTERECTOMY  1973   1/2 of ovary left cyst on ovary   ANTERIOR CERVICAL DECOMP/DISCECTOMY FUSION N/A 11/27/2023   Procedure: ANTERIOR CERVICAL  DECOMPRESSION/DISCECTOMY FUSION 3 LEVELS;  Surgeon: Elna Haggis, MD;  Location: [facility] OR;  Service: Neurosurgery;  Laterality: N/A;  Anterior Cervical Discectomy Fusion - Cervical Four-Cervical Five - Cervical Five-Cervical Six - Cervical Six-Cervical Seven   ANTERIOR LUMBAR FUSION N/A 05/17/2019   Procedure: Lumbar five Sacral one  Anterior lumbar interbody fusion;  Surgeon: Elna Haggis, MD;  Location: [facility] OR;  Service: Neurosurgery;  Laterality: N/A;   APPENDECTOMY  1973   removed with hysterectomy   COLONOSCOPY     LUMBAR WOUND DEBRIDEMENT  12/06/2011   Procedure: LUMBAR WOUND DEBRIDEMENT;  Surgeon: Elna Haggis, MD;  Location: [facility];  Service: Neurosurgery;  Laterality: N/A;  Repair of Pseudomeningocele Lumbar wound   shoulder rotator culff  Left 2006   SPINAL CORD STIMULATOR INSERTION  2019   at [facility] and removed 48 hours later due to extreme pain   spinal cord tumor  06/2011   TONSILLECTOMY     As a child    is allergic to ibuprofen -famotidine , pregabalin , and penicillins.   Family History  Problem Relation Age of Onset   Stroke Mother        2010   Heart attack Father    Cancer Paternal Grandmother    Uterine cancer Neg Hx    Bladder Cancer Neg Hx     Social History   Tobacco Use   Smoking status: Former    Current packs/day: 0.00    Average packs/day: 0.3 packs/day for 2.0 years (0.5 ttl pk-yrs)  Types: Cigarettes    Start date: 09/02/1965    Quit date: 09/03/1967    Years since quitting: 56.4   Smokeless tobacco: Never   Tobacco comments:    quit 48 years   Vaping Use   Vaping status: Never Used  Substance Use Topics   Alcohol use: Yes    Alcohol/week: 2.0 - 3.0 standard drinks of alcohol    Types: 2 - 3 Glasses of wine per week   Drug use: No     Review of Systems was negative for a full 10 system review except as noted in the History of Present Illness.   Current Facility-Administered Medications:    [START ON 06/23/2024] denosumab  (PROLIA )  injection 60 mg, 60 mg, Subcutaneous, Once, Gherghe, Cristina, MD  Current Outpatient Medications:    acetaminophen  (TYLENOL ) 500 MG tablet, Take 1 tablet (500 mg total) by mouth every 6 (six) hours as needed (pain)., Disp: 30 tablet, Rfl: 0   Acetylcysteine (NAC PO), Take 1 capsule by mouth daily., Disp: , Rfl:    Ascorbic Acid (VITAMIN C PO), Take 1 tablet by mouth daily., Disp: , Rfl:    estradiol  (ESTRACE ) 0.1 MG/GM vaginal cream, Place 0.5g nightly for two weeks then twice a week after, Disp: 42.5 g, Rfl: 11   lamoTRIgine  (LAMICTAL ) 25 MG tablet, Take 2 tablets every evening, Disp: 180 tablet, Rfl: 3   lidocaine  4 %, Place 1 patch onto the skin daily., Disp: , Rfl:    MAGNESIUM PO, Take 1 tablet by mouth daily., Disp: , Rfl:    methocarbamol  (ROBAXIN ) 500 MG tablet, Take 1 tablet (500 mg total) by mouth every 6 (six) hours as needed for muscle spasms., Disp: 30 tablet, Rfl: 3   NON FORMULARY, Pt uses a cpap nightly, Disp: , Rfl:    Omega-3 Fatty Acids (OMEGA 3 PO), Take 1 capsule by mouth daily., Disp: , Rfl:    oxyCODONE  (OXY IR/ROXICODONE ) 5 MG immediate release tablet, Take 1 tablet (5 mg total) by mouth every 4 (four) hours as needed for severe pain (pain score 7-10)., Disp: 15 tablet, Rfl: 0   oxyCODONE -acetaminophen  (PERCOCET/ROXICET) 5-325 MG tablet, Take 1-2 tablets by mouth every 4 (four) hours as needed for moderate pain (pain score 4-6) or severe pain (pain score 7-10). (Patient not taking: Reported on 01/23/2024), Disp: 40 tablet, Rfl: 0   polyethylene glycol powder (GLYCOLAX /MIRALAX ) 17 GM/SCOOP powder, Take 17 g by mouth daily. Drink 17g (1 scoop) dissolved in water per day., Disp: 255 g, Rfl: 0   predniSONE  (DELTASONE ) 20 MG tablet, Take 1 tablet (20 mg total) by mouth daily with breakfast., Disp: 5 tablet, Rfl: 0   Probiotic Product (PROBIOTIC DAILY PO), Take 1 capsule by mouth daily., Disp: , Rfl:    pyridoxine (B-6) 200 MG tablet, Take 200 mg by mouth daily., Disp: , Rfl:     traMADol  (ULTRAM ) 50 MG tablet, Take 1 tablet (50 mg total) by mouth 2 (two) times daily as needed. (Patient taking differently: Take 50 mg by mouth every 6 (six) hours as needed for moderate pain (pain score 4-6).), Disp: 60 tablet, Rfl: 0   TURMERIC PO, Take 1 capsule by mouth daily., Disp: , Rfl:    Objective There were no vitals filed for this visit.   Gen: NAD CV: S1 S2 RRR Lungs: Clear to auscultation bilaterally Abd: soft, nontender   Previous Pelvic Exam showed: POP-Q   -1  Aa   -1                                           Ba   -6                                              C    2                                            Gh   4                                            Pb   6.5                                            tvl    -2                                            Ap   -2                                            Bp                           Assessment/ Plan  Assessment: The patient is a 83 y.o. year old scheduled to undergo Exam under anesthesia, anterior repair with sacrospinous fixation, cystoscopy, urethral bulking, possible posterior repair. Verbal consent was obtained for these procedures.

## 2024-01-23 NOTE — Progress Notes (Signed)
 [facility] Pre-Operative Exam  Subjective Chief Complaint: Kaitlyn Flowers presents for a preoperative encounter.   History of Present Illness: Kaitlyn Flowers is a 83 y.o. female who presents for preoperative visit.  She is scheduled to undergo  Exam under anesthesia, anterior repair with sacrospinous fixation, cystoscopy, urethral bulking, possible posterior repair  on 02/16/24.  Her symptoms include pelvic organ prolapse and stress urinary incontinence, and she was was found to have Stage II anterior, Stage I posterior, Stage I apical prolapse.   Urodynamics showed: 1. Sensation was normal; capacity was normal 2. Stress Incontinence was demonstrated at ISD range pressures; 3. Detrusor Overactivity was not demonstrated. 4. 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   Urinary tract infection    Wears hearing aid      Past Surgical History:  Procedure Laterality Date   ABDOMINAL EXPOSURE N/A 05/17/2019   Procedure: ABDOMINAL EXPOSURE;  Surgeon: Dannis Dy, MD;  Location: [facility] OR;  Service: Vascular;  Laterality: N/A;   ABDOMINAL HYSTERECTOMY  1973   1/2 of ovary left cyst on ovary   ANTERIOR CERVICAL DECOMP/DISCECTOMY FUSION N/A 11/27/2023   Procedure: ANTERIOR  CERVICAL DECOMPRESSION/DISCECTOMY FUSION 3 LEVELS;  Surgeon: Elna Haggis, MD;  Location: [facility] OR;  Service: Neurosurgery;  Laterality: N/A;  Anterior Cervical Discectomy Fusion - Cervical Four-Cervical Five - Cervical Five-Cervical Six - Cervical Six-Cervical Seven   ANTERIOR LUMBAR FUSION N/A 05/17/2019   Procedure: Lumbar five Sacral one  Anterior lumbar interbody fusion;  Surgeon: Elna Haggis, MD;  Location: [facility] OR;  Service: Neurosurgery;  Laterality: N/A;   APPENDECTOMY  1973   removed with hysterectomy   COLONOSCOPY     LUMBAR WOUND DEBRIDEMENT  12/06/2011   Procedure: LUMBAR WOUND DEBRIDEMENT;  Surgeon: Elna Haggis, MD;  Location: [facility];  Service: Neurosurgery;  Laterality: N/A;  Repair of Pseudomeningocele Lumbar wound   shoulder rotator culff  Left 2006   SPINAL CORD STIMULATOR INSERTION  2019   at [facility] and removed 48 hours later due to extreme pain   spinal cord tumor  06/2011   TONSILLECTOMY     As a child    is allergic to ibuprofen -famotidine , pregabalin , and penicillins.   Family History  Problem Relation Age of Onset   Stroke Mother        2010   Heart attack Father    Cancer Paternal Grandmother    Uterine cancer Neg Hx    Bladder Cancer Neg Hx     Social History   Tobacco Use   Smoking status: Former    Current packs/day: 0.00    Average packs/day: 0.3 packs/day for 2.0 years (0.5 ttl pk-yrs)  Types: Cigarettes    Start date: 09/02/1965    Quit date: 09/03/1967    Years since quitting: 56.4   Smokeless tobacco: Never   Tobacco comments:    quit 48 years   Vaping Use   Vaping status: Never Used  Substance Use Topics   Alcohol use: Yes    Alcohol/week: 2.0 - 3.0 standard drinks of alcohol    Types: 2 - 3 Glasses of wine per week   Drug use: No     Review of Systems was negative for a full 10 system review except as noted in the History of Present Illness.   Current Outpatient Medications:    Acetylcysteine (NAC PO), Take 1 capsule by mouth  daily., Disp: , Rfl:    Ascorbic Acid (VITAMIN C PO), Take 1 tablet by mouth daily., Disp: , Rfl:    estradiol  (ESTRACE ) 0.1 MG/GM vaginal cream, Place 0.5g nightly for two weeks then twice a week after, Disp: 42.5 g, Rfl: 11   lamoTRIgine  (LAMICTAL ) 25 MG tablet, Take 2 tablets every evening, Disp: 180 tablet, Rfl: 3   lidocaine  4 %, Place 1 patch onto the skin daily., Disp: , Rfl:    MAGNESIUM PO, Take 1 tablet by mouth daily., Disp: , Rfl:    methocarbamol  (ROBAXIN ) 500 MG tablet, Take 1 tablet (500 mg total) by mouth every 6 (six) hours as needed for muscle spasms., Disp: 30 tablet, Rfl: 3   NON FORMULARY, Pt uses a cpap nightly, Disp: , Rfl:    Omega-3 Fatty Acids (OMEGA 3 PO), Take 1 capsule by mouth daily., Disp: , Rfl:    Probiotic Product (PROBIOTIC DAILY PO), Take 1 capsule by mouth daily., Disp: , Rfl:    pyridoxine (B-6) 200 MG tablet, Take 200 mg by mouth daily., Disp: , Rfl:    traMADol  (ULTRAM ) 50 MG tablet, Take 1 tablet (50 mg total) by mouth 2 (two) times daily as needed. (Patient taking differently: Take 50 mg by mouth every 6 (six) hours as needed for moderate pain (pain score 4-6).), Disp: 60 tablet, Rfl: 0   TURMERIC PO, Take 1 capsule by mouth daily., Disp: , Rfl:    oxyCODONE -acetaminophen  (PERCOCET/ROXICET) 5-325 MG tablet, Take 1-2 tablets by mouth every 4 (four) hours as needed for moderate pain (pain score 4-6) or severe pain (pain score 7-10). (Patient not taking: Reported on 01/23/2024), Disp: 40 tablet, Rfl: 0  Current Facility-Administered Medications:    [START ON 06/23/2024] denosumab  (PROLIA ) injection 60 mg, 60 mg, Subcutaneous, Once, Emilie Harden, MD   Objective Vitals:   01/23/24 0851  BP: (!) 152/83  Pulse: 80    Gen: NAD CV: S1 S2 RRR Lungs: Clear to auscultation bilaterally Abd: soft, nontender   Previous Pelvic Exam showed: POP-Q   -1                                            Aa   -1                                           Ba   -6  C    2                                            Gh   4                                            Pb   6.5                                            tvl    -2                                            Ap   -2                                            Bp                           Assessment/ Plan  Assessment: The patient is a 83 y.o. year old scheduled to undergo Exam under anesthesia, anterior repair with sacrospinous fixation, cystoscopy, urethral bulking, possible posterior repair. Verbal consent was obtained for these procedures.  Plan: General Surgical Consent: The patient has previously been counseled on alternative treatments, and the decision by the patient and provider was to proceed with the procedure listed above.  For all procedures, there are risks of bleeding, infection, damage to surrounding organs including but not limited to bowel, bladder, blood vessels, ureters and nerves, and need for further surgery if an injury were to occur. These risks are all low with minimally invasive surgery.   There are risks of numbness and weakness at any body site or buttock/rectal pain.  It is possible that baseline pain can be worsened by surgery, either with or without mesh. If surgery is vaginal, there is also a low risk of possible conversion to laparoscopy or open abdominal incision where indicated. Very rare risks include blood transfusion, blood clot, heart attack, pneumonia, or death.   There is also a risk of short-term postoperative urinary retention with need to use a catheter. About half of patients need to go home from surgery with a catheter, which is then later removed in the office. The risk of long-term need for a catheter is very low. There is also a risk of worsening of overactive bladder.     Prolapse (with or without mesh): Risk factors for surgical failure  include things that put pressure on your pelvis and  the surgical repair, including obesity, chronic cough, and heavy lifting or straining (including lifting children or adults, straining on the toilet, or lifting heavy objects such as furniture or anything weighing >25 lbs. Risks of recurrence is 20-30% with vaginal native tissue repair and a less than 10% with sacrocolpopexy with mesh.      We discussed consent for blood products. Risks for blood transfusion include allergic reactions, other reactions  that can affect different body organs and managed accordingly, transmission of infectious diseases such as HIV or Hepatitis. However, the blood is screened. Patient consents for blood products.  Pre-operative instructions:  She was instructed to not take Aspirin /NSAIDs x 7days prior to surgery. Hold fish oil and turmeric. Antibiotic prophylaxis was ordered as indicated.  Catheter use: Patient will go home with foley if needed after post-operative voiding trial.  Post-operative instructions:  She was provided with specific post-operative instructions, including precautions and signs/symptoms for which we would recommend contacting us , in addition to daytime and after-hours contact phone numbers. This was provided on a handout.   Post-operative medications: Prescriptions for tylenol , miralax , and oxycodone  were sent to her pharmacy. Discussed using ibuprofen  and tylenol  on a schedule to limit use of narcotics.   Laboratory testing:  We will check labs: As requested by anesthesia  Preoperative clearance:  She does not require surgical clearance.    Post-operative follow-up:  A post-operative appointment will be made for 6 weeks from the date of surgery. If she needs a post-operative nurse visit for a voiding trial, that will be set up after she leaves the hospital.    Patient will call the clinic or use MyChart should anything change or any new issues arise.   Artesha Wemhoff G Elek Holderness, NP

## 2024-01-29 DIAGNOSIS — K219 Gastro-esophageal reflux disease without esophagitis: Secondary | ICD-10-CM | POA: Diagnosis not present

## 2024-01-29 DIAGNOSIS — Z86018 Personal history of other benign neoplasm: Secondary | ICD-10-CM | POA: Diagnosis not present

## 2024-01-30 ENCOUNTER — Telehealth: Payer: Self-pay

## 2024-01-30 NOTE — Telephone Encounter (Signed)
 Called patient. She explained that she is having pain related to her neurosurgery procedure. I told her that her surgeon should have someone on call even if they are out of town. So she should call their office for any issues since we are not treating her for these symptoms. Patient expressed understanding and said she would call her surgeon's office.    Kaitlyn Lamp, MD

## 2024-01-30 NOTE — Telephone Encounter (Signed)
 Patient called requesting a refill for prediSone 20 mg. States her neurosurgereon is out on vacation for another two weeks and it was the reason Kaitlin prescribed her the medication.  Please advise

## 2024-02-04 ENCOUNTER — Ambulatory Visit: Payer: Self-pay

## 2024-02-04 NOTE — Telephone Encounter (Signed)
 FYI Only or Action Required?: FYI only for provider  Patient was last seen in primary care on 01/06/24 Dr. Donnie Galea Nurse Triage reporting Arm Pain. Symptoms began several months ago. Interventions attempted: Prescription medications: prednisone  finished yesterday and tramadol . Symptoms are: gradually worsening.  Triage Disposition: See PCP When Office is Open (Within 3 Days)  Patient/caregiver understands and will follow disposition?: Yes,  pt states her neurosurgeon is out of office.   Copied from CRM [id number]. Topic: Clinical - Red Word Triage >> Feb 04, 2024 10:05 AM Kaitlyn Flowers wrote: Red Word that prompted transfer to Nurse Triage: Extreme pain in hand/arm that's hard to manage Reason for Disposition  [1] MODERATE pain (e.g., interferes with normal activities) AND [2] present > 3 days  Answer Assessment - Initial Assessment Questions 1. ONSET: "When did the pain start?"     months 2. LOCATION: "Where is the pain located?"     Right arm and hand and wrist 3. PAIN: "How bad is the pain?" (Scale 1-10; or mild, moderate, severe)   - MILD (1-3): Doesn't interfere with normal activities.   - MODERATE (4-7): Interferes with normal activities (e.g., work or school) or awakens from sleep.   - SEVERE (8-10): Excruciating pain, unable to do any normal activities, unable to hold a cup of water.     3/10 4. WORK OR EXERCISE: "Has there been any recent work or exercise that involved this part of the body?"     Had surgery in march on her neck 5. CAUSE: "What do you think is causing the arm pain?"     Previous spinal decompression surgery 6. OTHER SYMPTOMS: "Do you have any other symptoms?" (e.g., neck pain, swelling, rash, fever, numbness, weakness)     no   Pt states that she had surgery recently on her neck and now is experiencing pain in the arm and hand and her neurosurgeon is out of office.  Protocols used: Arm Pain-A-AH

## 2024-02-05 ENCOUNTER — Telehealth (INDEPENDENT_AMBULATORY_CARE_PROVIDER_SITE_OTHER): Admitting: Family Medicine

## 2024-02-05 ENCOUNTER — Encounter: Payer: Self-pay | Admitting: Family Medicine

## 2024-02-05 DIAGNOSIS — K219 Gastro-esophageal reflux disease without esophagitis: Secondary | ICD-10-CM | POA: Diagnosis not present

## 2024-02-05 DIAGNOSIS — M79641 Pain in right hand: Secondary | ICD-10-CM | POA: Diagnosis not present

## 2024-02-05 NOTE — Patient Instructions (Addendum)
 I hope you are feeling better soon!  Seek in person care promptly if your symptoms worsen, new concerns arise or you are not improving with treatment.  It was nice to meet you today. I help [facility] out with telemedicine visits on Tuesdays and Thursdays and am happy to help if you need a virtual follow up visit on those days. Otherwise, if you have any concerns or questions following this visit please schedule a follow up visit with your Primary Care office or seek care at a local urgent care clinic to avoid delays in care. If you are having severe or life threatening symptoms please call 911 and/or go to the nearest emergency room.    GERD in Adults: Diet Changes When you have gastroesophageal reflux disease (GERD), you may need to make changes to your diet. Choosing the right foods can help with your symptoms. Think about working with an expert in healthy eating called a dietitian. They can help you make healthy food choices. What are tips for following this plan? Reading food labels Look for foods that are low in saturated fat. Foods that may help with your symptoms include: Foods with less than 5% of daily value (DV) of fat. Foods with 0 grams of trans fat. Cooking [employer] in ways that don't use a lot of fat. These ways include: Baking. Steaming. Grilling. Broiling. To add flavor, try to use herbs that are low in spice and acidity. Avoid frying your food. Meal planning  Eat small meals often rather than eating 3 large meals each day. Eat your meals slowly in a place where you feel relaxed. If told by your health care provider, avoid: Foods that cause symptoms. Keep a food diary to keep track of foods that cause symptoms. Alcohol. Drinking a lot of liquid with meals. General instructions For 2-3 hours after you eat, avoid: Bending over. Exercise. Lying down. Chew sugar-free gum after meals. What foods should I eat? Eat a healthy diet. Try to include: Foods with  high amounts of fiber. These include: Fruits and vegetables. Whole grains and beans. Low-fat dairy products. Lean meats, fish, and poultry. Egg whites. Foods that cause symptoms in someone else may not cause symptoms for you. Work with your provider to find foods that are safe for you. The items listed above may not be all the foods and drinks you can have. Talk with a dietitian to learn more. The items listed above may not be a complete list of foods and beverages you can eat and drink. Contact a dietitian for more information. What foods should I avoid? Limiting some of these foods may help with your symptoms. Each person is different. Talk with a dietitian or your provider to help you find the exact foods to avoid. Some of the foods to avoid may include: Fruits Fruits with a lot of acid in them. These may include citrus fruits, such as oranges, grapefruit, pineapple, and lemons. Vegetables Deep-fried vegetables, such as [place] fries. Vegetables, sauces, or toppings made with added fat and vegetables with acid in them. These may include tomatoes and tomato products, chili peppers, onions, garlic, and horseradish. Grains Pastries or quick breads with added fat. Meats and other proteins High-fat meats, such as fatty beef or pork, hot dogs, ribs, ham, sausage, salami, and bacon. Fried meat or protein, such as fried fish and fried chicken. Egg yolks. Fats and oils Butter. Margarine. Shortening. Ghee. Drinks Coffee and other drinks with caffeine  in them. Fizzy and sugary drinks,  such as soda and energy drinks. Fruit juice made with acidic fruits, such as orange or grapefruit. Tomato juice. Sweets and desserts Chocolate and cocoa. Donuts. Seasonings and condiments Mint, such as peppermint and spearmint. Condiments, herbs, or seasonings that cause symptoms. These may include curry, hot sauce, or vinegar-based salad dressings. The items listed above may not be all the foods and drinks you  should avoid. Talk with a dietitian to learn more. Questions to ask your health care provider Changes to your diet and everyday life are often the first steps taken to manage symptoms of GERD. If these changes don't help, talk with your provider about taking medicines. Where to find more information [employer]: aboutgerd.org This information is not intended to replace advice given to you by your health care provider. Make sure you discuss any questions you have with your health care provider. Document Revised: 07/01/2023 Document Reviewed: 01/15/2023 Elsevier Patient Education  2024 [employer].

## 2024-02-05 NOTE — Progress Notes (Signed)
 Virtual Visit via Video Note  I connected with Kaitlyn Flowers  on 02/05/24 at  5:40 PM EDT by a video enabled telemedicine application and verified that I am speaking with the correct person using two identifiers.  Location patient: [place] Location provider:work or home office Persons participating in the virtual visit: patient, provider  I discussed the limitations and requested verbal permission for telemedicine visit. The patient expressed understanding and agreed to proceed.   HPI:  Acute telemedicine visit for:  1. hand and wrist issues: -Onset: for several weeks or more -Symptoms include: pain/discomfort in ulnar side of hand and wrist and medial aspect -Denies: -Has tried: Saw her neurosurgeon about this and had a nerve study, and was treated with prednisone  which helped some but then the pain returned, then took another round of prednisone  (12 days total) which helped again, but is back in pain again, is not interested in continuing prednisone  -she is reaching out to Dr. Ruthann Cover office as well, but he is out of the office so she hasn't got the nerve study results yet -she does rest her elbow on the table a lot, does not report w/n -reports Dr. Ellery Guthrie told her might be carpel tunnel, she has a wrist splint but is unsure how to use it -Pertinent past medical history: see below -Pertinent medication allergies: Allergies  Allergen Reactions   Ibuprofen -Famotidine  Other (See Comments)    Elevated BP   Pregabalin  Swelling    AMS, Swelling   Penicillins Rash    Did it involve swelling of the face/tongue/throat, SOB, or low BP? No Did it involve sudden or severe rash/hives, skin peeling, or any reaction on the inside of your mouth or nose? No Did you need to seek medical attention at a hospital or doctor's office? No When did it last happen?      30+ years If all above answers are "NO", may proceed with cephalosporin use.    2. GERD: -reports has seen PCP and GI about  this -intermittent heartburn/reflux -prilosec helps when takes it -wonders about management approach since intermittnent  ROS: See pertinent positives and negatives per HPI.  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   Urinary tract infection    Wears hearing aid     Past Surgical History:  Procedure Laterality Date   ABDOMINAL EXPOSURE N/A 05/17/2019   Procedure: ABDOMINAL EXPOSURE;  Surgeon: Dannis Dy, MD;  Location: [facility] OR;  Service: Vascular;  Laterality: N/A;   ABDOMINAL HYSTERECTOMY  1973   1/2 of ovary left cyst on ovary   ANTERIOR CERVICAL DECOMP/DISCECTOMY FUSION N/A 11/27/2023   Procedure: ANTERIOR CERVICAL DECOMPRESSION/DISCECTOMY FUSION 3 LEVELS;  Surgeon: Elna Haggis, MD;  Location: [facility] OR;  Service: Neurosurgery;  Laterality: N/A;  Anterior Cervical Discectomy Fusion - Cervical Four-Cervical Five - Cervical Five-Cervical Six - Cervical Six-Cervical Seven  ANTERIOR LUMBAR FUSION N/A 05/17/2019   Procedure: Lumbar five Sacral one  Anterior lumbar interbody fusion;  Surgeon: Elna Haggis, MD;  Location: [facility] OR;  Service: Neurosurgery;  Laterality: N/A;   APPENDECTOMY  1973   removed with hysterectomy   COLONOSCOPY     LUMBAR WOUND DEBRIDEMENT  12/06/2011   Procedure: LUMBAR WOUND DEBRIDEMENT;  Surgeon: Elna Haggis, MD;  Location: [facility] OR[facility];  Service: Neurosurgery;  Laterality: N/A;  Repair of Pseudomeningocele Lumbar wound   shoulder rotator culff  Left 2006    SPINAL CORD STIMULATOR INSERTION  2019   at [facility] and removed 48 hours later due to extreme pain   spinal cord tumor  06/2011   TONSILLECTOMY     As a child     Current Outpatient Medications:    acetaminophen  (TYLENOL ) 500 MG tablet, Take 1 tablet (500 mg total) by mouth every 6 (six) hours as needed (pain)., Disp: 30 tablet, Rfl: 0   Acetylcysteine (NAC PO), Take 1 capsule by mouth daily., Disp: , Rfl:    Ascorbic Acid (VITAMIN C PO), Take 1 tablet by mouth daily., Disp: , Rfl:    estradiol  (ESTRACE ) 0.1 MG/GM vaginal cream, Place 0.5g nightly for two weeks then twice a week after, Disp: 42.5 g, Rfl: 11   lamoTRIgine  (LAMICTAL ) 25 MG tablet, Take 2 tablets every evening, Disp: 180 tablet, Rfl: 3   lidocaine  4 %, Place 1 patch onto the skin daily., Disp: , Rfl:    MAGNESIUM PO, Take 1 tablet by mouth daily., Disp: , Rfl:    methocarbamol  (ROBAXIN ) 500 MG tablet, Take 1 tablet (500 mg total) by mouth every 6 (six) hours as needed for muscle spasms., Disp: 30 tablet, Rfl: 3   NON FORMULARY, Pt uses a cpap nightly, Disp: , Rfl:    Omega-3 Fatty Acids (OMEGA 3 PO), Take 1 capsule by mouth daily., Disp: , Rfl:    oxyCODONE  (OXY IR/ROXICODONE ) 5 MG immediate release tablet, Take 1 tablet (5 mg total) by mouth every 4 (four) hours as needed for severe pain (pain score 7-10)., Disp: 15 tablet, Rfl: 0   oxyCODONE -acetaminophen  (PERCOCET/ROXICET) 5-325 MG tablet, Take 1-2 tablets by mouth every 4 (four) hours as needed for moderate pain (pain score 4-6) or severe pain (pain score 7-10)., Disp: 40 tablet, Rfl: 0   polyethylene glycol powder (GLYCOLAX /MIRALAX ) 17 GM/SCOOP powder, Take 17 g by mouth daily. Drink 17g (1 scoop) dissolved in water per day., Disp: 255 g, Rfl: 0   predniSONE  (DELTASONE ) 20 MG tablet, Take 1 tablet (20 mg total) by mouth daily with breakfast., Disp: 5 tablet, Rfl: 0   Probiotic Product (PROBIOTIC DAILY PO), Take 1 capsule by mouth daily., Disp: , Rfl:    pyridoxine (B-6) 200 MG  tablet, Take 200 mg by mouth daily., Disp: , Rfl:    traMADol  (ULTRAM ) 50 MG tablet, Take 1 tablet (50 mg total) by mouth 2 (two) times daily as needed. (Patient taking differently: Take 50 mg by mouth every 6 (six) hours as needed for moderate pain (pain score 4-6).), Disp: 60 tablet, Rfl: 0   TURMERIC PO, Take 1 capsule by mouth daily., Disp: , Rfl:   Current Facility-Administered Medications:    [START ON 06/23/2024] denosumab  (PROLIA ) injection 60 mg, 60 mg, Subcutaneous, Once, Gherghe, Cristina, MD  EXAM:  VITALS per patient if applicable:  GENERAL: alert, oriented, appears well and in no acute distress  HEENT: atraumatic, conjunttiva clear, no obvious abnormalities on inspection of external nose and ears  NECK: normal movements of the head and neck  LUNGS: on inspection no signs of respiratory distress, breathing rate appears normal, no obvious gross SOB, gasping or wheezing  CV: no obvious cyanosis  MS: moves all visible extremities without noticeable abnormality  PSYCH/NEURO: pleasant and cooperative, no obvious depression or anxiety, speech and thought processing grossly intact  ASSESSMENT AND PLAN:  Discussed the following assessment and plan:  Pain of right hand  Gastroesophageal reflux disease, unspecified whether esophagitis present  -we discussed possible serious and likely etiologies, options for evaluation and workup, limitations of telemedicine visit vs in person visit, treatment, treatment risks and precautions. Pt is agreeable to treatment via telemedicine at this moment. Quer ulnar vs medial neuropathy, possible carpel tunnel. Discussed trial of not resting elbow on table, cock up srist splint x 1 week, then at night and discussed proper fit, topical capsaicin, tylenol  and also other options for inperson evaluation if worsening or not improving with theses measures before her neurosurgeon is able to follow up with her.  Talked about various causes and alt dx for  heartburn symptoms. She has seen PCP and GI and was told just gerd and to take prilosec. Advised of dietary/lifestyle treatment and OTC/rx medication options for intermittent vs persistent symptoms. She plans to try dietary changes and use the prilosec when needed for flares and agrees to follow up with PCP if needed.     I discussed the assessment and treatment plan with the patient. The patient was provided an opportunity to ask questions and all were answered. The patient agreed with the plan and demonstrated an understanding of the instructions.     Maurie Southern, DO

## 2024-02-08 ENCOUNTER — Other Ambulatory Visit: Payer: Self-pay | Admitting: Internal Medicine

## 2024-02-09 ENCOUNTER — Other Ambulatory Visit (HOSPITAL_COMMUNITY)
Admission: RE | Admit: 2024-02-09 | Discharge: 2024-02-09 | Disposition: A | Discharge status: Home / Self Care | Source: Other Acute Inpatient Hospital | Attending: Obstetrics and Gynecology | Admitting: Obstetrics and Gynecology

## 2024-02-09 ENCOUNTER — Ambulatory Visit (INDEPENDENT_AMBULATORY_CARE_PROVIDER_SITE_OTHER)

## 2024-02-09 VITALS — BP 120/71 | HR 83

## 2024-02-09 DIAGNOSIS — R300 Dysuria: Secondary | ICD-10-CM

## 2024-02-09 LAB — POCT URINALYSIS DIP (CLINITEK)
Glucose, UA: 100 mg/dL — AB
Leukocytes, UA: NEGATIVE
Nitrite, UA: POSITIVE — AB
POC PROTEIN,UA: 30 — AB
Spec Grav, UA: 1.02 — AB (ref 1.010–1.025)
Urobilinogen, UA: 1 U/dL
pH, UA: 5 (ref 5.0–8.0)

## 2024-02-09 MED ORDER — SULFAMETHOXAZOLE-TRIMETHOPRIM 800-160 MG PO TABS: 1.0000 | ORAL_TABLET | Freq: Two times a day (BID) | ORAL | 0 refills | Status: DC

## 2024-02-09 NOTE — Patient Instructions (Signed)
 Your Urine dip that was done in office was Positive. I am sending the urine off for culture and you can take AZO over the counter for your discomfort.  We have ordered Bactrim for you to take while we wait for your culture results, hopefully this gives you some relief. We will contact you when the results are back between 3-5 days.  If a different antibiotic is needed we will sent the order to the pharmacy and you will be notified. If you have any questions or concerns please feel free to call us at [phone]

## 2024-02-09 NOTE — Progress Notes (Unsigned)
 Kaitlyn Flowers is a 83 y.o. female  arrived today with UTI sx.  Per Dr. Sunnie England protocol: A urine specimen was collected and POCT Urine was done and urine culture sent to the lab. POCT Urine was POSITIVE for Leukoctyes Pt was notified and prescription sent to the preferred pharmacy.

## 2024-02-10 LAB — URINE CULTURE

## 2024-02-11 ENCOUNTER — Telehealth: Payer: Self-pay | Admitting: Obstetrics and Gynecology

## 2024-02-11 NOTE — Telephone Encounter (Signed)
 Spoke with patient. She has been having increasing pain related to her cervical spine surgery that she had in March and is waiting for evaluation by her neurosurgeon which will not be for another 2 weeks. She prefers to reschedule her surgery. We discussed tentatively rescheduling in July. Pt will keep us  updated with her progress.   Arma Lamp, MD

## 2024-02-11 NOTE — Telephone Encounter (Signed)
 Called and left message for patient to establish plan of care regarding surgery that is scheduled for Monday

## 2024-02-12 ENCOUNTER — Encounter: Payer: Self-pay | Admitting: *Deleted

## 2024-02-14 DIAGNOSIS — G4733 Obstructive sleep apnea (adult) (pediatric): Secondary | ICD-10-CM | POA: Diagnosis not present

## 2024-02-20 ENCOUNTER — Ambulatory Visit: Admitting: Obstetrics and Gynecology

## 2024-02-20 DIAGNOSIS — G5601 Carpal tunnel syndrome, right upper limb: Secondary | ICD-10-CM | POA: Diagnosis not present

## 2024-02-23 ENCOUNTER — Telehealth: Payer: Self-pay | Admitting: Obstetrics and Gynecology

## 2024-02-23 ENCOUNTER — Other Ambulatory Visit: Payer: Self-pay | Admitting: Neurology

## 2024-02-23 DIAGNOSIS — G40009 Localization-related (focal) (partial) idiopathic epilepsy and epileptic syndromes with seizures of localized onset, not intractable, without status epilepticus: Secondary | ICD-10-CM

## 2024-02-23 NOTE — Telephone Encounter (Signed)
 Pt called in and left a voicemail.  She wants to postpone surgery. She did not leave a reason why.  I did call her back to find out, but it went straight to voicemail.  I told her I would send a message back and someone would call her back.

## 2024-02-25 ENCOUNTER — Ambulatory Visit: Admitting: Obstetrics and Gynecology

## 2024-02-26 ENCOUNTER — Encounter: Admitting: Neurology

## 2024-02-26 NOTE — Telephone Encounter (Signed)
 Surgery cancelled on 02/23/24.  Pt will be called to reschedule. Thanks Sotero CMA

## 2024-02-27 DIAGNOSIS — G4733 Obstructive sleep apnea (adult) (pediatric): Secondary | ICD-10-CM | POA: Diagnosis not present

## 2024-03-03 ENCOUNTER — Ambulatory Visit (HOSPITAL_COMMUNITY): Admission: RE | Admit: 2024-03-03 | Source: Home / Self Care | Admitting: Obstetrics and Gynecology

## 2024-03-03 DIAGNOSIS — M50123 Cervical disc disorder at C6-C7 level with radiculopathy: Secondary | ICD-10-CM | POA: Diagnosis not present

## 2024-03-03 DIAGNOSIS — G5601 Carpal tunnel syndrome, right upper limb: Secondary | ICD-10-CM | POA: Diagnosis not present

## 2024-03-03 SURGERY — COLPORRHAPHY, ANTERIOR, FOR CYSTOCELE REPAIR
Anesthesia: General

## 2024-03-07 ENCOUNTER — Other Ambulatory Visit: Payer: Self-pay | Admitting: Family

## 2024-03-15 ENCOUNTER — Encounter (HOSPITAL_BASED_OUTPATIENT_CLINIC_OR_DEPARTMENT_OTHER): Payer: Self-pay

## 2024-03-15 ENCOUNTER — Emergency Department (HOSPITAL_BASED_OUTPATIENT_CLINIC_OR_DEPARTMENT_OTHER): Admitting: Radiology

## 2024-03-15 ENCOUNTER — Other Ambulatory Visit: Payer: Self-pay

## 2024-03-15 ENCOUNTER — Ambulatory Visit: Payer: Self-pay | Admitting: Internal Medicine

## 2024-03-15 ENCOUNTER — Emergency Department (HOSPITAL_BASED_OUTPATIENT_CLINIC_OR_DEPARTMENT_OTHER): Admission: EM | Admit: 2024-03-15 | Discharge: 2024-03-15 | Disposition: A | Discharge status: Home / Self Care

## 2024-03-15 ENCOUNTER — Emergency Department (HOSPITAL_BASED_OUTPATIENT_CLINIC_OR_DEPARTMENT_OTHER)

## 2024-03-15 DIAGNOSIS — S0990XA Unspecified injury of head, initial encounter: Secondary | ICD-10-CM | POA: Diagnosis not present

## 2024-03-15 DIAGNOSIS — R519 Headache, unspecified: Secondary | ICD-10-CM | POA: Diagnosis present

## 2024-03-15 DIAGNOSIS — G4733 Obstructive sleep apnea (adult) (pediatric): Secondary | ICD-10-CM | POA: Diagnosis not present

## 2024-03-15 DIAGNOSIS — M25552 Pain in left hip: Secondary | ICD-10-CM | POA: Insufficient documentation

## 2024-03-15 DIAGNOSIS — Z043 Encounter for examination and observation following other accident: Secondary | ICD-10-CM | POA: Diagnosis not present

## 2024-03-15 DIAGNOSIS — M8580 Other specified disorders of bone density and structure, unspecified site: Secondary | ICD-10-CM | POA: Diagnosis not present

## 2024-03-15 DIAGNOSIS — M47816 Spondylosis without myelopathy or radiculopathy, lumbar region: Secondary | ICD-10-CM | POA: Diagnosis not present

## 2024-03-15 DIAGNOSIS — I6782 Cerebral ischemia: Secondary | ICD-10-CM | POA: Diagnosis not present

## 2024-03-15 DIAGNOSIS — W1809XA Striking against other object with subsequent fall, initial encounter: Secondary | ICD-10-CM | POA: Insufficient documentation

## 2024-03-15 DIAGNOSIS — R0683 Snoring: Secondary | ICD-10-CM

## 2024-03-15 DIAGNOSIS — I672 Cerebral atherosclerosis: Secondary | ICD-10-CM | POA: Diagnosis not present

## 2024-03-15 NOTE — Discharge Instructions (Signed)
 Please follow-up with your primary doctor.  Return immediately for fevers, chills, lightheadedness, sudden onset headache, vision changes, facial droop, unilateral weakness, chest pain, shortness of breath, abdominal pain or any new or worsening symptoms that are concerning to you.

## 2024-03-15 NOTE — ED Triage Notes (Signed)
Also c/o left hip pain.

## 2024-03-15 NOTE — ED Notes (Signed)
 Pt d/c instructions, medications, and follow-up care reviewed with pt. Pt verbalized understanding and had no further questions at time of d/c. Pt CA&Ox4, ambulatory, and in NAD at time of d/c

## 2024-03-15 NOTE — ED Provider Notes (Signed)
 Kaitlyn Flowers   Kaitlyn Flowers: [id number] Arrival date & time: 03/15/24  2102     Patient presents with: Head Injury   Kaitlyn Flowers is a 83 y.o. female.   This is an 83 year old female presenting emergency department for evaluation after a fall.  MechanicalSABRA Rasmussen backwards hit her head.  Reportedly m missed a stair at the bottom of stairs.  Reports pain to the back of the head as well as to the left hip.  No chest pain no shortness of breath no abdominal pain.  No numbness tingling changes in sensation.  Not on a blood thinner.  No vision changes.  No stroke symptoms unilateral weakness.   Head Injury      Prior to Admission medications   Medication Sig Start Date End Date Taking? Authorizing Provider  acetaminophen  (TYLENOL ) 500 MG tablet Take 1 tablet (500 mg total) by mouth every 6 (six) hours as needed (pain). 01/23/24   Zuleta, Kaitlyn G, NP  Acetylcysteine (NAC PO) Take 1 capsule by mouth daily.    [provider]  Ascorbic Acid (VITAMIN C PO) Take 1 tablet by mouth daily.    [provider]  estradiol  (ESTRACE ) 0.1 MG/GM vaginal cream Place 0.5g nightly for two weeks then twice a week after 11/10/23   Kaitlyn Rosaline SAILOR, MD  lamoTRIgine  (LAMICTAL ) 25 MG tablet TAKE 2 TABLETS BY MOUTH EVERY EVENING 02/23/24   Kaitlyn Darice HERO, MD  lidocaine  4 % Place 1 patch onto the skin daily.    [provider]  MAGNESIUM PO Take 1 tablet by mouth daily.    [provider]  methocarbamol  (ROBAXIN ) 500 MG tablet Take 1 tablet (500 mg total) by mouth every 6 (six) hours as needed for muscle spasms. 11/28/23   Kaitlyn Shove, MD  NON FORMULARY Pt uses a cpap nightly    [provider]  Omega-3 Fatty Acids (OMEGA 3 PO) Take 1 capsule by mouth daily.    [provider]  oxyCODONE  (OXY IR/ROXICODONE ) 5 MG immediate release tablet Take 1 tablet (5 mg total) by mouth every 4 (four) hours as needed  for severe pain (pain score 7-10). 01/23/24   Zuleta, Kaitlyn G, NP  oxyCODONE -acetaminophen  (PERCOCET/ROXICET) 5-325 MG tablet Take 1-2 tablets by mouth every 4 (four) hours as needed for moderate pain (pain score 4-6) or severe pain (pain score 7-10). 11/28/23   Kaitlyn Shove, MD  polyethylene glycol powder (GLYCOLAX /MIRALAX ) 17 GM/SCOOP powder Take 17 Flowers by mouth daily. Drink 17g (1 scoop) dissolved in water per day. 01/23/24   Zuleta, Kaitlyn G, NP  predniSONE  (DELTASONE ) 20 MG tablet Take 1 tablet (20 mg total) by mouth daily with breakfast. 01/23/24   Zuleta, Kaitlyn G, NP  Probiotic Product (PROBIOTIC DAILY PO) Take 1 capsule by mouth daily.    [provider]  pyridoxine (Flowers-6) 200 MG tablet Take 200 mg by mouth daily.    [provider]  sulfamethoxazole -trimethoprim  (BACTRIM  DS) 800-160 MG tablet Take 1 tablet by mouth 2 (two) times daily. 02/09/24   Zuleta, Kaitlyn G, NP  traMADol  (ULTRAM ) 50 MG tablet Take 1 tablet (50 mg total) by mouth 2 (two) times daily as needed. 02/08/24   Webb, Padonda B, FNP  TURMERIC PO Take 1 capsule by mouth daily.    [provider]    Allergies: Ibuprofen -famotidine , Pregabalin , and Penicillins    Review of Systems  Updated Vital Signs BP (!) 141/75 (BP Location: Left Arm)  Pulse 76   Temp 98.4 F (36.9 C) (Oral)   Resp 16   LMP  (LMP Unknown)   SpO2 98%   Physical Exam Vitals and nursing Flowers reviewed.  Constitutional:      General: She is not in acute distress.    Appearance: She is not toxic-appearing.  HENT:     Head: Normocephalic and atraumatic.     Nose: Nose normal.     Mouth/Throat:     Mouth: Mucous membranes are moist.  Eyes:     Conjunctiva/sclera: Conjunctivae normal.  Cardiovascular:     Rate and Rhythm: Normal rate and regular rhythm.  Pulmonary:     Effort: Pulmonary effort is normal.  Abdominal:     General: Abdomen is flat. There is no distension.  Musculoskeletal:     Comments: Ambulatory.   Skin:    General: Skin is warm and dry.     Capillary Refill: Capillary refill takes less than 2 seconds.  Neurological:     General: No focal deficit present.     Mental Status: She is alert and oriented to person, place, and time.  Psychiatric:        Mood and Affect: Mood normal.        Behavior: Behavior normal.     (all labs ordered are listed, but only abnormal results are displayed) Labs Reviewed - No data to display  EKG: None  Radiology: DG Hip Unilat With Pelvis 2-3 Views Left Result Date: 03/15/2024 CLINICAL DATA:  Fall EXAM: DG HIP (WITH OR WITHOUT PELVIS) 2-3V LEFT COMPARISON:  None Available. FINDINGS: The bones are osteopenic. There is no acute fracture or dislocation identified. Joint spaces are maintained. There is dextroconvex curvature of the lumbar spine with multilevel degenerative change. Orthopedic hardware seen at the lumbosacral level. IMPRESSION: 1. No acute fracture or dislocation identified. 2. Osteopenia. Electronically Signed   By: Kaitlyn Flowers M.D.   On: 03/15/2024 21:56   CT Head Wo Contrast Result Date: 03/15/2024 CLINICAL DATA:  Fell wall walking down steps. Fell back and hit head. Missed last step of the bottom of the stairs. EXAM: CT HEAD WITHOUT CONTRAST TECHNIQUE: Contiguous axial images were obtained from the base of the skull through the vertex without intravenous contrast. RADIATION DOSE REDUCTION: This exam was performed according to the departmental dose-optimization program which includes automated exposure control, adjustment of the mA and/or kV according to patient size and/or use of iterative reconstruction technique. COMPARISON:  CT head 07/10/2016 FINDINGS: Brain: No intracranial hemorrhage, mass effect, or evidence of acute infarct. No hydrocephalus. No extra-axial fluid collection. Generalized cerebral atrophy and chronic small vessel ischemic disease. Vascular: No hyperdense vessel. Intracranial arterial calcification. Skull: No fracture  or focal lesion. Sinuses/Orbits: No acute finding. Other: None. IMPRESSION: No acute intracranial abnormality. Electronically Signed   By: Kaitlyn Flowers M.D.   On: 03/15/2024 21:38     Procedures   Medications Ordered in the ED - No data to display                                  Medical Decision Making This is an 83 year old female presenting emergency department for evaluation after a fall.  She is afebrile nontachycardic hemodynamically stable.  Physical exam reassuring alert and oriented x 3.  Ambulatory in the room without significant distress.  Not on blood thinner.  Given patient's advanced age CT head obtained.  Negative for acute intracranial  pathology.  Also having some left hip pain.  Negative for fracture.  She is otherwise well-appearing.  Mechanical fall.  Low suspicion for acute intrathoracic or intra-abdominal injury.  Will forego labs at this time.  No LOC, chest pain, palpitations.  Discharged stable condition.  Amount and/or Complexity of Data Reviewed Radiology: ordered.       Final diagnoses:  None    ED Discharge Orders     None          Neysa Caron PARAS, DO 03/15/24 2242

## 2024-03-15 NOTE — Telephone Encounter (Signed)
 FYI Only or Action Required?: Action required by provider: update on patient condition.  Patient is followed in Pulmonology for Sleep Apnea, last seen on 08/25/2023 by [name], MD.  Called Nurse Triage reporting Insomnia.  Symptoms began several days ago.  Interventions attempted: Other: called and spoke to respiratory therapist who thought her pressure settings needed to be increased.  Symptoms are: unchanged.  Triage Disposition: See PCP When Office is Open (Within 3 Days)-needs a call back  Patient/caregiver understands and will follow disposition?: Yes  Copied from CRM [id number]. Topic: Clinical - Red Word Triage >> Mar 15, 2024  4:16 PM Joesph PARAS wrote: Red Word that prompted transfer to Nurse Triage: States having issues with CPAP and wants to speak to a nurse about it. Reason for Disposition  MODERATE - SEVERE insomnia (e.g., awake most of night; lack of sleep interferes with ability to function during the day; interferes with work or school)  Answer Assessment - Initial Assessment Questions 1. DESCRIPTION: Tell me about your sleeping problem. (e.g., waking frequently during night, sleeping during day and awake at night, trouble falling asleep) How bad is it?      Patient reports feeling exhausted along with headaches 2. ONSET: How long have you been having trouble sleeping? (e.g., days, weeks, months; longstanding sleep problems)     1 week ago 3. DAYTIME SLEEP PATTERN: How much time do you spend sleeping or napping during the day?     Yes due to being tired 4. STRESSORS: Is there anything that is making you feel stressed? Is there something that worries you?     no 5. PAIN: Do you have any pain that is keeping you awake? (e.g., back pain, joint pain) If Yes, ask How bad is the pain? (e.g., scale 0-10; mild, moderate, severe).     Headache-3-4 out of 10 6. CAFFEINE : Do you drink caffeinated beverages? If Yes, ask How much each day? (e.g., coffee, tea,  colas)     no 7. ALCOHOL USE OR SUBSTANCE USE: Do you drink alcohol or use any substances?     no 8. MEDICINE CHANGE: Has there been any recent change in medicines? (e.g., new medicine started, stopped, or dose changed).      no 9. TREATMENT: What have you done so far to treat this sleep problem? (e.g., prescription or OTC sleep medicines, herbal or dietary supplements, cannabis, relaxation strategies)     N/a 10. OTHER SYMPTOMS: Do you have any other symptoms?  (e.g., difficulty breathing)       No  Patient spoke with respiratory therapist who told patient that her settings may need to be adjusted. Patient is asking for a phone call back.  Protocols used: Insomnia-A-AH

## 2024-03-15 NOTE — ED Triage Notes (Signed)
 Pt presents via POV c/o falling while walking down step and falling back and hitting the back of her head. Reports missed the last step to the bottom of the stairs. Denies blood thinner usage. Denies LOC. A&O x4 at this time.

## 2024-03-16 ENCOUNTER — Ambulatory Visit
Admission: EM | Admit: 2024-03-16 | Discharge: 2024-03-16 | Disposition: A | Discharge status: Home / Self Care | Attending: Family Medicine | Admitting: Family Medicine

## 2024-03-16 DIAGNOSIS — M79661 Pain in right lower leg: Secondary | ICD-10-CM | POA: Diagnosis not present

## 2024-03-16 DIAGNOSIS — S81819A Laceration without foreign body, unspecified lower leg, initial encounter: Secondary | ICD-10-CM

## 2024-03-16 NOTE — ED Triage Notes (Signed)
 Pt states she lost her balance and fell going down steps last night-states she was seen at ED last night-states she did not realize she cut her RLE until after leaving ED-NAD-slow gait with own cane

## 2024-03-16 NOTE — Telephone Encounter (Signed)
 Tried calling the patient to review cpap issues. No answer- vm not set up so unable to leave msg.

## 2024-03-16 NOTE — ED Provider Notes (Signed)
 [facility]  Note:  This document was prepared using Conservation officer, historic buildings and may include unintentional dictation errors.  MRN: [id number] DOB: 09-15-1940  Subjective:   Kaitlyn Flowers is a 83 y.o. female presenting for suffering a right lower leg laceration.  Patient was seen at the emergency room last night after her fall.  Had imaging done that was negative.  However, she did not realize she also had suffered a right lower leg laceration.  Has kept the wound covered.   Current Facility-Administered Medications:    [START ON 06/23/2024] denosumab  (PROLIA ) injection 60 mg, 60 mg, Subcutaneous, Once, Gherghe, Cristina, MD  Current Outpatient Medications:    acetaminophen  (TYLENOL ) 500 MG tablet, Take 1 tablet (500 mg total) by mouth every 6 (six) hours as needed (pain)., Disp: 30 tablet, Rfl: 0   Acetylcysteine (NAC PO), Take 1 capsule by mouth daily., Disp: , Rfl:    Ascorbic Acid (VITAMIN C PO), Take 1 tablet by mouth daily., Disp: , Rfl:    estradiol  (ESTRACE ) 0.1 MG/GM vaginal cream, Place 0.5g nightly for two weeks then twice a week after, Disp: 42.5 g, Rfl: 11   lamoTRIgine  (LAMICTAL ) 25 MG tablet, TAKE 2 TABLETS BY MOUTH EVERY EVENING, Disp: 180 tablet, Rfl: 0   lidocaine  4 %, Place 1 patch onto the skin daily., Disp: , Rfl:    MAGNESIUM PO, Take 1 tablet by mouth daily., Disp: , Rfl:    methocarbamol  (ROBAXIN ) 500 MG tablet, Take 1 tablet (500 mg total) by mouth every 6 (six) hours as needed for muscle spasms., Disp: 30 tablet, Rfl: 3   NON FORMULARY, Pt uses a cpap nightly, Disp: , Rfl:    Omega-3 Fatty Acids (OMEGA 3 PO), Take 1 capsule by mouth daily., Disp: , Rfl:    oxyCODONE  (OXY IR/ROXICODONE ) 5 MG immediate release tablet, Take 1 tablet (5 mg total) by mouth every 4 (four) hours as needed for severe pain (pain score 7-10)., Disp: 15 tablet, Rfl: 0   oxyCODONE -acetaminophen  (PERCOCET/ROXICET) 5-325 MG tablet, Take 1-2 tablets by mouth  every 4 (four) hours as needed for moderate pain (pain score 4-6) or severe pain (pain score 7-10)., Disp: 40 tablet, Rfl: 0   polyethylene glycol powder (GLYCOLAX /MIRALAX ) 17 GM/SCOOP powder, Take 17 g by mouth daily. Drink 17g (1 scoop) dissolved in water per day., Disp: 255 g, Rfl: 0   predniSONE  (DELTASONE ) 20 MG tablet, Take 1 tablet (20 mg total) by mouth daily with breakfast., Disp: 5 tablet, Rfl: 0   Probiotic Product (PROBIOTIC DAILY PO), Take 1 capsule by mouth daily., Disp: , Rfl:    pyridoxine (B-6) 200 MG tablet, Take 200 mg by mouth daily., Disp: , Rfl:    sulfamethoxazole -trimethoprim  (BACTRIM  DS) 800-160 MG tablet, Take 1 tablet by mouth 2 (two) times daily., Disp: 6 tablet, Rfl: 0   traMADol  (ULTRAM ) 50 MG tablet, Take 1 tablet (50 mg total) by mouth 2 (two) times daily as needed., Disp: 60 tablet, Rfl: 0   TURMERIC PO, Take 1 capsule by mouth daily., Disp: , Rfl:    Allergies  Allergen Reactions   Ibuprofen -Famotidine  Other (See Comments)    Elevated BP   Pregabalin  Swelling    AMS, Swelling   Penicillins Rash    Did it involve swelling of the face/tongue/throat, SOB, or low BP? No Did it involve sudden or severe rash/hives, skin peeling, or any reaction on the inside of your mouth or nose? No Did you need to seek medical  attention at a hospital or doctor's office? No When did it last happen?      30+ years If all above answers are "NO", may proceed with cephalosporin use.     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   Urinary tract infection    Wears hearing aid      Past Surgical History:  Procedure Laterality Date   ABDOMINAL EXPOSURE N/A 05/17/2019   Procedure: ABDOMINAL EXPOSURE;  Surgeon: Eliza Lonni RAMAN, MD;  Location: [facility] OR;  Service: Vascular;  Laterality: N/A;   ABDOMINAL HYSTERECTOMY  1973   1/2 of ovary left cyst on ovary   ANTERIOR CERVICAL DECOMP/DISCECTOMY FUSION N/A 11/27/2023   Procedure: ANTERIOR CERVICAL DECOMPRESSION/DISCECTOMY FUSION 3 LEVELS;  Surgeon: Colon Shove, MD;  Location: [facility] OR;  Service: Neurosurgery;  Laterality: N/A;  Anterior Cervical Discectomy Fusion - Cervical Four-Cervical Five - Cervical Five-Cervical Six - Cervical Six-Cervical Seven   ANTERIOR LUMBAR FUSION N/A 05/17/2019   Procedure: Lumbar five Sacral one  Anterior lumbar interbody fusion;  Surgeon: Colon Shove, MD;  Location: [facility] OR;  Service: Neurosurgery;  Laterality: N/A;   APPENDECTOMY  1973   removed with hysterectomy   COLONOSCOPY     LUMBAR WOUND DEBRIDEMENT  12/06/2011   Procedure: LUMBAR WOUND DEBRIDEMENT;  Surgeon: Shove Colon, MD;  Location: [facility] ORS;  Service: Neurosurgery;  Laterality: N/A;  Repair of Pseudomeningocele Lumbar wound   shoulder rotator culff  Left 2006   SPINAL CORD STIMULATOR INSERTION  2019   at [facility] and removed 48 hours later due to extreme pain   spinal cord tumor  06/2011   TONSILLECTOMY     As a child    Family History  Problem Relation Age of Onset   Stroke Mother        2010   Heart attack Father    Cancer Paternal Grandmother    Uterine cancer Neg Hx    Bladder Cancer Neg Hx     Social History   Tobacco Use   Smoking status: Former    Current packs/day: 0.00    Average packs/day: 0.3 packs/day for 2.0 years (0.5 ttl pk-yrs)    Types: Cigarettes    Start date: 09/02/1965    Quit date: 09/03/1967    Years since quitting: 56.5   Smokeless tobacco: Never   Tobacco comments:    quit 48 years   Vaping  Use   Vaping status: Never Used  Substance Use Topics   Alcohol use: Not Currently    Comment: weekly   Drug use: No    ROS   Objective:   Vitals: BP 123/74 (BP Location: Left Arm)   Pulse 81   Temp 97.8 F (36.6 C) (Oral)   Resp 20   LMP  (LMP Unknown)   SpO2 97%   Physical Exam  Constitutional:      General: She is not in acute distress.    Appearance: Normal appearance. She is well-developed. She is not ill-appearing, toxic-appearing or diaphoretic.  HENT:     Head: Normocephalic and atraumatic.     Nose: Nose normal.     Mouth/Throat:     Mouth: Mucous membranes are moist.  Eyes:     General: No scleral icterus.       Right eye: No discharge.        Left eye: No discharge.     Extraocular Movements: Extraocular movements intact.  Cardiovascular:     Rate and Rhythm: Normal rate.  Pulmonary:     Effort: Pulmonary effort is normal.  Skin:    General: Skin is warm and dry.      Neurological:     General: No focal deficit present.     Mental Status: She is alert and oriented to person, place, and time.  Psychiatric:        Mood and Affect: Mood normal.        Behavior: Behavior normal.     Assessment and Plan :   PDMP not reviewed this encounter.  1. Pain in right lower leg   2. Skin tear of lower leg without complication, initial encounter    Skin tear not amenable to laceration repair.  Wound must heal by secondary intention.  Wound care reviewed.  Counseled patient on potential for adverse effects with medications prescribed/recommended today, ER and return-to-clinic precautions discussed, patient verbalized understanding.    Christopher Savannah, PA-C 03/16/24 1153

## 2024-03-16 NOTE — Discharge Instructions (Addendum)
 Change your dressing 2-3 times daily. Every time you change your dressing, clean the wound gently with warm water and Dial antibacterial soap. Pat the wound dry, let it breathe for roughly 30 minutes before covering it back up. When you reapply a dressing, apply Bacitracin  ointment to the wound, then cover with non-stick/non-adherent gauze.  You can expect this wound fully to heal over 4-6 weeks. If you develop fever, drainage of pus, worsening pain then please return to our clinic for a recheck.

## 2024-03-17 ENCOUNTER — Telehealth: Payer: Self-pay

## 2024-03-17 NOTE — Telephone Encounter (Signed)
 Please get me a CPAP download so I can look at pressure settings.

## 2024-03-17 NOTE — Transitions of Care (Post Inpatient/ED Visit) (Signed)
   03/17/2024  Name: Kaitlyn Flowers MRN: [id number] DOB: 08-07-1941  Today's TOC FU Call Status: Today's TOC FU Call Status:: Unsuccessful Call (1st Attempt) Unsuccessful Call (1st Attempt) Date: 03/17/24  Attempted to reach the patient regarding the most recent Inpatient/ED visit.  Follow Up Plan: Additional outreach attempts will be made to reach the patient to complete the Transitions of Care (Post Inpatient/ED visit) call.   Signature  [employer]

## 2024-03-18 ENCOUNTER — Other Ambulatory Visit: Payer: Self-pay

## 2024-03-18 DIAGNOSIS — G4733 Obstructive sleep apnea (adult) (pediatric): Secondary | ICD-10-CM

## 2024-03-18 DIAGNOSIS — R0683 Snoring: Secondary | ICD-10-CM

## 2024-03-18 NOTE — Progress Notes (Signed)
 Order for CPAP pressure adjustment per Dr. Neysa.  See telephone triage encounter from 03/15/2024

## 2024-03-18 NOTE — Telephone Encounter (Signed)
 30 day compliance report printed from [facility] and placed on Dr. Saundra desk in [facility] for review.

## 2024-03-18 NOTE — Telephone Encounter (Signed)
 I have reviewed the download from her CPAP machine. She is having more breakthrough apneas than we want. I will try changing the pressure settings.  Order- DME  Please change autopap range to 4-15. Send download report after 1 week. Thanks.

## 2024-03-18 NOTE — Telephone Encounter (Signed)
 Placed orders encounter for CPAP pressure adjustment per Dr. Neysa on 03/18/2024.

## 2024-03-19 ENCOUNTER — Ambulatory Visit: Payer: Self-pay

## 2024-03-19 NOTE — Telephone Encounter (Signed)
 FYI Only or Action Required?: FYI only for provider.  Patient was last seen in primary care on 02/05/2024 by Luke Chiquita SAUNDERS, DO.  Called Nurse Triage reporting Laceration.  Symptoms began several days ago.  Interventions attempted: Prescription medications: tramadol  and antibiotic cream.  Symptoms are: unchanged.  Triage Disposition: See PCP When Office is Open (Within 3 Days)  Patient/caregiver understands and will follow disposition?: Yes     Copied from CRM [id number]. Topic: Clinical - Red Word Triage >> Mar 19, 2024  3:45 PM Larissa RAMAN wrote: [facility] that prompted transfer to Nurse Triage: fall- laceration to rt leg, ER f/u    ----------------------------------------------------------------------- From previous Reason for Contact - Scheduling: Patient/patient representative is calling to schedule an appointment. Refer to attachments for appointment information. Reason for Disposition  [1] Last tetanus shot > 10 years ago AND [2] CLEAN cut  Answer Assessment - Initial Assessment Questions 1. APPEARANCE of INJURY: What does the injury look like?      Laceration to right leg about 1 &1/2 in and skin flap is gray 2. ONSET: How long ago did the injury occur?      7/14 3. LOCATION: Where is the injury located?      Right leg 4. SIZE: How large is the cut?      1 $1/2 in 5. BLEEDING: Is it bleeding now? If Yes, ask: Is it difficult to stop?      no 6. PAIN: Is there any pain? If Yes, ask: How bad is the pain? (Scale 0-10; or none, mild, moderate, severe)     2/10 7. MECHANISM: Tell me how it happened.      Had a fall 8. TETANUS: When was your last tetanus booster?     Unknown  Pt states she also has some ankle swelling just puffiness, but no issues walking.  Protocols used: Cuts and Lacerations-A-AH

## 2024-03-20 NOTE — Progress Notes (Unsigned)
 HPI F for sleep evaluation courtesy of Dr Charlett with concerns of snoring, daytime tiredness, witnessed apnea when son visited. Medical problem list includes Autonomic Dysfunction, Epilepsy/ Lamictal , R leg weakness, Osteoporosis, Spondylolisthesis,  HST 05/01/23- AHI 34.8/hr, snoring with desaturation to 57%/ 73 minutes</= 88%, body weight 103 lbs PFT 07/03/23- mild Diffusion defect  ====================================================================================  08/25/23- 83 yoF followed for OSA, Dyspnea on Exertion, complicated by Autonomic Dysfunction, Epilepsy/ Lamictal , R leg weakness, Osteoporosis, Spondylolisthesis,  Medical problem list includes Autonomic Dysfunction, Epilepsy/ Lamictal , R leg weakness, Osteoporosis, Spondylolisthesis,  HST 05/01/23- AHI 34.8/hr, snoring with desaturation to 57%/ 73 minutes</= 88%, body weight 103 lbs CPAP auto 4-10/ [facility]   ordered 06/05/23 Download compliance- 90%, AHI 1.1/hr Body Weight today-107 lb CXR 08/12/23 IMPRESSION: *No active cardiopulmonary disease.  Discussed the use of AI scribe software for clinical note transcription with the patient, who gave verbal consent to proceed.  History of Present Illness   The patient, with a history of sleep apnea managed with a CPAP machine, presents with a new onset of dry nose and sneezing. She reports that upon waking up, she felt like the air coming in was hot, which has never happened before. She has been sneezing all day long. She is not familiar with how to adjust the humidifier on her CPAP machine.  Despite this, she expresses satisfaction with her nasal mask, stating that it works much better for her. She reports that her breathing feels pretty normal, with no dramatic changes. She also mentions a struggle with balance due to scoliosis, which affects her low back.   Assessment and Plan:    Obstructive Sleep Apnea Patient reports good compliance with CPAP therapy and satisfaction with nasal  mask. However, she experienced dryness and sneezing due to heated air from the device. Apnea control is excellent with less than 2 breakthrough apneas per hour. -Adjust humidifier settings on CPAP machine as per the instruction manual. -Consider use of over-the-counter nasal saline gel for nasal dryness. -Continue current CPAP settings (4-10 cm H2O).  Scoliosis Patient reports difficulty with balance due to low back curvature. -No changes to current management plan discussed.  General Health Maintenance Upcoming 90-day mark with [facility]. -Submit renewal order to [facility] to continue current CPAP therapy. -Patient can continue to contact [facility] for any concerns or questions.      03/23/24- 83 yoF followed for OSA, Dyspnea on Exertion, complicated by Scoliosis, Autonomic Dysfunction, Epilepsy/ Lamictal , R leg weakness, Osteoporosis, Spondylolisthesis,  Medical problem list includes Autonomic Dysfunction, Epilepsy/ Lamictal , R leg weakness, Osteoporosis, Spondylolisthesis,  HST 05/01/23- AHI 34.8/hr, snoring with desaturation to 57%/ 73 minutes</= 88%, body weight 103 lbs CPAP auto 4-15/ [facility]   ordered 06/05/23 Download compliance- 73%, AHI 15/hr- mostly obstructive  Pressure is ranging 9.4-9.7 Body Weight today- Changed to 4-15  2 weeks ago. Fatigue  ------Has headache in the morning when she awakes Discussed the use of AI scribe software for clinical note transcription with the patient, who gave verbal consent to proceed.  History of Present Illness   Kaitlyn Flowers is an 83 year old female with sleep apnea who presents with persistent morning headaches and breakthrough apneas.  She experiences approximately fifteen apneas per hour, leading to less restful sleep and morning headaches. Her CPAP machine is set to range between four and ten centimeters of water pressure, predominantly operating around 9.6 to 10 centimeters. She consistently uses the machine despite these issues. We had  ordered change to auto 4-15, but this didn't happen.  She  has tried a different mask as advised by a therapist at [facility], which she likes and reports no leaks with the new hybrid mask that covers her nose and mouth.  She has no new issues with breathing or chest discomfort.     Assessment and Plan:    Obstructive Sleep Apnea Obstructive sleep apnea with increased apneas, averaging fifteen per hour, causing less restful sleep and morning headaches. Current CPAP settings remain unchanged, potentially contributing to symptoms. - Contact [facility] to adjust CPAP settings as prescribed. - Ensure communication with [facility] branch of [facility] for necessary changes.     Scoliosis Not affecting comfort enough to impact sleep currently     ROS-see HPI   + = positive Constitutional:    weight loss, night sweats, fevers, chills, fatigue, lassitude. HEENT:    headaches, difficulty swallowing, tooth/dental problems, sore throat,       sneezing, itching, ear ache, nasal congestion, post nasal drip, snoring CV:    chest pain, orthopnea, PND, swelling in lower extremities, anasarca,                                   dizziness, palpitations Resp:   shortness of breath with exertion or at rest.                productive cough,   non-productive cough, coughing up of blood.              change in color of mucus.  wheezing.   Skin:    rash or lesions. GI:  No-   heartburn, indigestion, abdominal pain, nausea, vomiting, diarrhea,                 change in bowel habits, loss of appetite GU: dysuria, change in color of urine, no urgency or frequency.   flank pain. MS:   joint pain, stiffness, decreased range of motion, back pain. Neuro-     nothing unusual Psych:  change in mood or affect.  depression or anxiety.   memory loss.  OBJ- Physical Exam    +petite General- Alert, Oriented, Affect-appropriate, Distress- none acute Skin- rash-none, lesions- none, excoriation- none Lymphadenopathy- none Head-  atraumatic            Eyes- Gross vision intact, PERRLA, conjunctivae and secretions clear            Ears- Hearing, canals-normal            Nose- Clear, no-Septal dev, mucus, polyps, erosion, perforation             Throat- Mallampati III-IV , mucosa clear , drainage- none, tonsils-absent, +teeth, Neck- flexible , trachea midline, no stridor , thyroid  nl, carotid no bruit Chest - symmetrical excursion , unlabored           Heart/CV- RRR , no murmur , no gallop  , no rub, nl s1 s2                           - JVD- none , edema- none, stasis changes- none, varices- none           Lung- clear to P&A, wheeze- none, cough- none , dullness-none, rub- none           Chest wall- +scoliosis- able to sit more comfortably at this visit Abd-  Br/ Gen/ Rectal- Not done, not indicated Extrem- cyanosis- none, clubbing,  none, atrophy- none, strength- nl Neuro- grossly intact to observation

## 2024-03-22 ENCOUNTER — Ambulatory Visit: Admitting: Family Medicine

## 2024-03-22 ENCOUNTER — Encounter: Payer: Self-pay | Admitting: Family Medicine

## 2024-03-22 VITALS — BP 122/60 | HR 95 | Temp 98.0°F | Resp 16 | Ht 58.66 in | Wt 104.1 lb

## 2024-03-22 DIAGNOSIS — Z23 Encounter for immunization: Secondary | ICD-10-CM | POA: Diagnosis not present

## 2024-03-22 DIAGNOSIS — W19XXXD Unspecified fall, subsequent encounter: Secondary | ICD-10-CM | POA: Diagnosis not present

## 2024-03-22 DIAGNOSIS — S81801D Unspecified open wound, right lower leg, subsequent encounter: Secondary | ICD-10-CM

## 2024-03-22 NOTE — Progress Notes (Signed)
 Chief Complaint  Patient presents with   Laceration    Happened last Tuesday when she fell; was seen at urgent care    HPI: [name] is a 83 y.o. female with PMHx significant for spinal stenosis with neurogenic claudication, hyponatremia,OSA,and tension headaches here today to follow on recent visit to Urgent Care.   She presented to the ED on 7/14 for evaluation after a fall. During her fall she hit her head. I Head and hip imaging was negative for acute process.  The next day she presented to the Acute Care Urgent [facility] after noticing a laceration on her right lower extremity that she did not notice after fall.   Today, she states she is uncertain if her right lower extremity wound is healing well.  She reports her recent fall was her first fall in a while.  She typically ambulates with a cane and she tripped on a step and fell.   She reports headaches, however, she attributes this to her CPAP machine malfunctioning and reports it as not new.  Denies any fever, chills, appetite changes, confusion, or cognitive impairments.   Head CT 03/15/24: No acute intracranial abnormality.   Left hip X ray 03/15/24: 1. No acute fracture or dislocation identified. 2. Osteopenia.  Review of Systems  Constitutional:  Negative for chills and fever.  HENT:  Negative for sore throat.   Respiratory:  Negative for cough, shortness of breath and wheezing.   Cardiovascular:  Negative for chest pain, palpitations and leg swelling.  Gastrointestinal:  Negative for abdominal pain, nausea and vomiting.  Genitourinary:  Negative for decreased urine volume, dysuria and hematuria.  Musculoskeletal:  Positive for back pain and gait problem.  Skin:  Positive for wound. Negative for rash.  Neurological:  Negative for syncope, facial asymmetry and weakness.  See other pertinent positives and negatives in HPI.  Current Outpatient Medications on File Prior to Visit  Medication Sig Dispense Refill    acetaminophen  (TYLENOL ) 500 MG tablet Take 1 tablet (500 mg total) by mouth every 6 (six) hours as needed (pain). 30 tablet 0   Acetylcysteine (NAC PO) Take 1 capsule by mouth daily.     Ascorbic Acid (VITAMIN C PO) Take 1 tablet by mouth daily.     estradiol  (ESTRACE ) 0.1 MG/GM vaginal cream Place 0.5g nightly for two weeks then twice a week after 42.5 g 11   lamoTRIgine  (LAMICTAL ) 25 MG tablet TAKE 2 TABLETS BY MOUTH EVERY EVENING 180 tablet 0   lidocaine  4 % Place 1 patch onto the skin daily.     MAGNESIUM PO Take 1 tablet by mouth daily.     methocarbamol  (ROBAXIN ) 500 MG tablet Take 1 tablet (500 mg total) by mouth every 6 (six) hours as needed for muscle spasms. 30 tablet 3   NON FORMULARY Pt uses a cpap nightly     Omega-3 Fatty Acids (OMEGA 3 PO) Take 1 capsule by mouth daily.     oxyCODONE  (OXY IR/ROXICODONE ) 5 MG immediate release tablet Take 1 tablet (5 mg total) by mouth every 4 (four) hours as needed for severe pain (pain score 7-10). 15 tablet 0   oxyCODONE -acetaminophen  (PERCOCET/ROXICET) 5-325 MG tablet Take 1-2 tablets by mouth every 4 (four) hours as needed for moderate pain (pain score 4-6) or severe pain (pain score 7-10). 40 tablet 0   polyethylene glycol powder (GLYCOLAX /MIRALAX ) 17 GM/SCOOP powder Take 17 g by mouth daily. Drink 17g (1 scoop) dissolved in water per day. 255 g 0  predniSONE  (DELTASONE ) 20 MG tablet Take 1 tablet (20 mg total) by mouth daily with breakfast. 5 tablet 0   Probiotic Product (PROBIOTIC DAILY PO) Take 1 capsule by mouth daily.     pyridoxine (B-6) 200 MG tablet Take 200 mg by mouth daily.     sulfamethoxazole -trimethoprim  (BACTRIM  DS) 800-160 MG tablet Take 1 tablet by mouth 2 (two) times daily. 6 tablet 0   traMADol  (ULTRAM ) 50 MG tablet Take 1 tablet (50 mg total) by mouth 2 (two) times daily as needed. 60 tablet 0   TURMERIC PO Take 1 capsule by mouth daily.     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   Urinary tract infection    Wears hearing aid    Allergies  Allergen Reactions   Ibuprofen -Famotidine  Other (See Comments)    Elevated BP   Pregabalin  Swelling    AMS, Swelling   Penicillins Rash    Did it involve swelling of the face/tongue/throat, SOB, or low BP? No Did it involve sudden or severe rash/hives, skin peeling, or any reaction on the inside of your mouth or nose? No Did you need to seek medical attention at a hospital or doctor's office? No When did it last happen?      30+ years If all above answers are "NO", may proceed with cephalosporin use.     Social History   Socioeconomic History   Marital status: Widowed    Spouse name: Not on file   Number of children: Not on file   Years of education: Not on file   Highest education level: Doctorate  Occupational History   Not on file  Tobacco Use   Smoking status: Former    Current packs/day: 0.00    Average packs/day: 0.3 packs/day for 2.0 years (0.5 ttl pk-yrs)    Types: Cigarettes    Start date: 09/02/1965    Quit date: 09/03/1967    Years since quitting: 56.5   Smokeless tobacco: Never   Tobacco comments:    quit 48 years   Vaping Use   Vaping status: Never Used  Substance and Sexual Activity   Alcohol use: Not  Currently    Comment: weekly   Drug use: No   Sexual activity: Not Currently    Partners: Male    Birth control/protection: Surgical    Comment: hysterectomy  Other Topics Concern   Not on file  Social History Narrative   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   [place]  home in [place] since 1990   Right handed  One story home   Lives alone   Social Drivers of Health   Financial Resource Strain: Low Risk  (03/12/2023)   Overall Financial Resource Strain (CARDIA)    Difficulty of Paying Living Expenses: Not hard at all  Food Insecurity: No Food Insecurity (03/12/2023)   Hunger Vital Sign    Worried About Running Out of Food in the Last Year: Never true    Ran Out of Food in the Last Year: Never true  Transportation Needs: No Transportation Needs (03/12/2023)   PRAPARE - Administrator, Civil Service (Medical): No    Lack of Transportation (Non-Medical): No  Physical Activity: Sufficiently Active (03/12/2023)   Exercise Vital Sign    Days of Exercise per Week: 6 days    Minutes of Exercise per Session: 30 min  Stress: Stress Concern Present (03/12/2023)   [employer] of Occupational Health - Occupational Stress Questionnaire    Feeling of Stress : To some extent  Social Connections: Moderately Integrated (03/12/2023)   Social Connection and Isolation Panel    Frequency of Communication with Friends and Family: More than three times a week    Frequency of Social Gatherings with Friends and Family: More than three times a week    Attends Religious Services: More than 4 times per year    Active Member of [employer] or Organizations: Yes    Attends Banker Meetings: More than 4 times per year    Marital Status: Widowed   Vitals:   03/22/24 1500  BP: 122/60  Pulse: 95  Resp: 16  Temp: 98 F (36.7 C)  SpO2: 94%   Body mass index is 21.28 kg/m.  Physical Exam Vitals and nursing note reviewed.  Constitutional:       Appearance: Normal appearance.  HENT:     Head: Normocephalic and atraumatic.  Cardiovascular:     Rate and Rhythm: Normal rate and regular rhythm.     Pulses:          Dorsalis pedis pulses are 2+ on the right side.     Heart sounds: Murmur (? soft SEM LUSB) heard.  Pulmonary:     Effort: Pulmonary effort is normal. No respiratory distress.     Breath sounds: Normal breath sounds.  Musculoskeletal:     Right lower leg: No edema.     Left lower leg: No edema.  Skin:    Findings: Abrasion and ecchymosis present. No rash.      Neurological:     General: No focal deficit present.     Mental Status: She is alert and oriented to person, place, and time.     Comments: Gait assisted with a cane.  Psychiatric:        Mood and Affect: Mood and affect normal.   ASSESSMENT AND PLAN: [name] was seen today for an ED follow up.  Orders Placed This Encounter  Procedures   Tdap vaccine greater than or equal to 7yo IM   Leg wound, right, subsequent encounter In general it seems to be healing well. Cleaned with alcohol, non sticky bandage apply on laceration and wrapped with adhesive gauze/bandage. Instructed to keep it clean with soap and water. Keep wound uncovered during the day for a few hours and continue covering it with a nonstick bandage at bedtime. She is applying topical abx oint, recommend not to apply on laceration. Monitor for signs of infection. Tdap updated.  -     Tdap vaccine greater than or  equal to 7yo IM  Fall, subsequent encounter Some of her co morbilities and medications may increase her risk of falls. Fall precautions discussed.  On examination today a small SEM noted, she is not aware of any hx of heart murmur. Asymptomatic. Echo in 04/2016 showed trivial mitral regurgitation. Instructed about warning signs.  I spent a total of 32 minutes in both face to face and non face to face activities for this visit on the date of this encounter. During  this time history was obtained and documented, examination was performed, prior imaging reviewed, and assessment/plan discussed.  Return in about 3 weeks (around 04/12/2024) for Leg wound with PCP.  I, Vernell Forest, acting as a scribe for Leevon Upperman [place], MD., have documented all relevant documentation on the behalf of Kaitlyn Bogen [place], MD, as directed by   while in the presence of Kaitlyn Lavis [place], MD.  I, Kaitlyn Bradish [place], MD, have reviewed all documentation for this visit. The documentation on 03/22/24 for the exam, diagnosis, procedures, and orders are all accurate and complete.  Kaitlyn Paradise G. [place], MD  [facility] office

## 2024-03-22 NOTE — Patient Instructions (Addendum)
 A few things to remember from today's visit:  Leg wound, right, subsequent encounter - Plan: Tdap vaccine greater than or equal to 82yo IM  Fall, subsequent encounter  Keep wound clean with soap and water. Monitor for signs of infection. Do not put oint on wound but rather around. Keep it uncovered for a few hours during the day.  If you need refills for medications you take chronically, please call your pharmacy. Do not use My Chart to request refills or for acute issues that need immediate attention. If you send a my chart message, it may take a few days to be addressed, specially if I am not in the office.  Please be sure medication list is accurate. If a new problem present, please set up appointment sooner than planned today.

## 2024-03-23 ENCOUNTER — Encounter: Payer: Self-pay | Admitting: Internal Medicine

## 2024-03-23 ENCOUNTER — Ambulatory Visit: Admitting: Internal Medicine

## 2024-03-23 VITALS — BP 130/75 | HR 80 | Temp 98.3°F | Resp 18 | Ht 60.0 in | Wt 105.4 lb

## 2024-03-23 DIAGNOSIS — G4733 Obstructive sleep apnea (adult) (pediatric): Secondary | ICD-10-CM | POA: Diagnosis not present

## 2024-03-23 DIAGNOSIS — M419 Scoliosis, unspecified: Secondary | ICD-10-CM | POA: Diagnosis not present

## 2024-03-23 NOTE — Patient Instructions (Signed)
 Order- [facility]- please change autopap range to 4-15  Please call if we can help

## 2024-03-29 ENCOUNTER — Encounter: Payer: Self-pay | Admitting: Internal Medicine

## 2024-03-30 ENCOUNTER — Encounter: Admitting: Obstetrics and Gynecology

## 2024-04-02 DIAGNOSIS — M4180 Other forms of scoliosis, site unspecified: Secondary | ICD-10-CM | POA: Diagnosis not present

## 2024-04-12 ENCOUNTER — Ambulatory Visit: Payer: Medicare PPO | Admitting: Neurology

## 2024-04-12 ENCOUNTER — Encounter: Payer: Self-pay | Admitting: Neurology

## 2024-04-12 DIAGNOSIS — G40009 Localization-related (focal) (partial) idiopathic epilepsy and epileptic syndromes with seizures of localized onset, not intractable, without status epilepticus: Secondary | ICD-10-CM | POA: Diagnosis not present

## 2024-04-12 MED ORDER — LAMOTRIGINE 25 MG PO TABS: ORAL_TABLET | ORAL | 4 refills | Status: AC

## 2024-04-12 NOTE — Patient Instructions (Signed)
 Good to see you doing well! Continue Lamotrigine  25mg : take 1 tablet every night. Follow-up in 1 year, call for any changes.    Seizure Precautions: 1. If medication has been prescribed for you to prevent seizures, take it exactly as directed.  Do not stop taking the medicine without talking to your doctor first, even if you have not had a seizure in a long time.   2. Avoid activities in which a seizure would cause danger to yourself or to others.  Don't operate dangerous machinery, swim alone, or climb in high or dangerous places, such as on ladders, roofs, or girders.  Do not drive unless your doctor says you may.  3. If you have any warning that you may have a seizure, lay down in a safe place where you can't hurt yourself.    4.  No driving for 6 months from last seizure, as per [place]  state law.   Please refer to the following link on the [employer]ile[employer]'s website for more information: http://www.epilepsyfoundation.org/answerplace/Social/driving/drivingu.cfm   5.  Maintain good sleep hygiene.  6.  Contact your doctor if you have any problems that may be related to the medicine you are taking.  7.  Call 911 and bring the patient back to the ED if:        A.  The seizure lasts longer than 5 minutes.       B.  The patient doesn't awaken shortly after the seizure  C.  The patient has new problems such as difficulty seeing, speaking or moving  D.  The patient was injured during the seizure  E.  The patient has a temperature over 102 F (39C)  F.  The patient vomited and now is having trouble breathing

## 2024-04-12 NOTE — Progress Notes (Signed)
 NEUROLOGY FOLLOW UP OFFICE NOTE  Kaitlyn Flowers [id number] [birth year]  HISTORY OF PRESENT ILLNESS: I had the pleasure of seeing Kaitlyn Flowers in follow-up in the neurology clinic on 04/12/2024.  The patient was last seen over a year ago for seizures. She is alone in the office today. Records and images were personally reviewed where available.  She had a syncopal episode on 03/03/16, then a transient episode of expressive aphasia on 03/25/16. Stroke workup was unremarkable, MRI brain did not show any acute changes, no significant stenosis seen intracranially or with carotid dopplers. Echo normal. She had another transient episode of aphasia on 07/21/16, this time followed by intense headache. No headache with previous event. Her EEG was abnormal with bilateral temporal slowing and rare left frontotemporal epileptiform discharges. Since her last visit, she continues to do well with no seizures or seizure-like symptoms since 2017 on low dose Lamotrigine  25mg  at bedtime. She denies any staring/unresponsive episodes, gaps in time, olfactory/gustatory hallucinations, focal numbness/tingling/weakness, myoclonic jerks. She denies any headaches, dizziness. She had cervical spine surgery in March and did really well. She continues to deal with off and on back pain. She is sleeping okay with her CPAP. 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 No falls.    History On Initial Assessment 05/29/2016: This is a very pleasant 83 yo RH woman who presented after a syncopal episode on 03/03/16 then a transient episode of aphasia on 03/25/16. She lives alone and was sitting outside last 03/03/16. She recalls turning around to get water, then blacked out. She woke up on the ground and found her glasses broken, she hit the right side of her face. No tongue bite/incontinence. She denied any prodromal symptoms, no dizziness or headaches. She had been drinking a lot of water due  to recurrent UTIs since January 2017. She went to the ER, it was felt fall was likely a result of orthostatic hypotension from sitting out in the heat. No bloodwork on record from that visit. Then on 03/25/16 she was reading something off her phone and could not make sense of it. She showed the phone to her friend who said it looked fine. Then she found she could not get/grab my words. Her friend drove her to the ER, she felt her friend was driving the wrong way and she was thinking this, but could not say it. This lasted 20-30 minutes, no associated focal numbness/tingling/weakness, headaches, or dizziness. Her BP on arrival was 139/72. I personally reviewed head CT without contrast which did not show any acute changes. She was found to have a sodium level of 123. She was also diagnosed with another UTI with >100,000 aerococcus urinae. She denies any further recurrence of symptoms. She denies any olfactory/gustatory hallucinations, deja vu, rising epigastric sensation, focal numbness/tingling/weakness, myoclonic jerks. She has chronic back pain and self-catheterizes herself every night. She has chronic right leg numbness and weakness. For the past 1-1/2 years, she has had pain the right sacral area, worsened by sitting. She continues to work with PT.    She underwent a stroke workup, I personally reviewed images available. 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 MRA head did not show any intracranial stenosis. Carotid dopplers did not show any evidence of ICA stenosis bilaterally. Her echocardiogram showed normal EF 60-65%, normal RV size and systolic function, no significant valvular abnormalities, left atrium normal size. Lipid  panel showed an LDL of 114, total cholesterol 241. She started to take a daily baby aspirin .  She saw her PCP on 11/6 reporting random headaches and wondered if related to her BP. She was scheduled for an  earlier appointment today, then yesterday had another transient episode of aphasia. She was working at [employer] for the election, looking at a card, then realized she could not see the words correctly. When she tried to speak, her words were jumbled. She states the aphasia lasted 30-40 minutes. A friend took her home then to her PCP office at noon, where bloodwork was done. She went to the ER at 11pm, reporting severe headache that started an hour prior. She had an intense pressure and throbbing over the left temporal region, 8/10, with nausea and vomiting. Headache lasted until this morning. No photo/phono/phonophobia. There was no aphasia noted in the ER, headache was treated with IV fluids and APAP. She denied any focal weakness or paresthesias. She denied having a headache with the previous episode of aphasia. She has no prior history of migraines. Over the past 2 weeks, she has been having random headaches localized on different parts of her head, occurring every 3-4 days, resolving after Tylenol  intake.   Her mother had a stroke. Otherwise she had a normal birth and early development.  There is no history of febrile convulsions, CNS infections such as meningitis/encephalitis, significant traumatic brain injury, neurosurgical procedures, or family history of seizures.  Diagnostic Data: EEG in 07/2016: This awake and asleep EEG is abnormal due to the presence of: 1. Independent focal slowing seen over the bilateral temporal regions, left greater than right 2. Rare epileptiform discharges over the left frontotemporal region   EEG in 09/2018:  This 1-hour awake and asleep EEG is abnormal due to the presence of: 1. Focal slowing over the bilateral temporal regions, left greater than right 2. 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   Urinary tract infection    Wears hearing aid     MEDICATIONS: Current Outpatient Medications on File Prior to Visit  Medication Sig Dispense Refill   acetaminophen  (TYLENOL ) 500 MG tablet Take 1 tablet (500 mg total) by mouth every 6 (six) hours as needed (pain). 30 tablet 0   Acetylcysteine (NAC PO) Take 1 capsule by mouth daily.     Ascorbic Acid (VITAMIN C PO) Take 1 tablet by mouth daily.     estradiol  (ESTRACE ) 0.1 MG/GM vaginal cream Place 0.5g nightly for two weeks then twice a week after 42.5 g 11   lamoTRIgine  (LAMICTAL ) 25 MG tablet TAKE 2 TABLETS BY MOUTH EVERY EVENING 180 tablet 0   lidocaine  4 % Place 1 patch onto the skin daily. (Patient not taking: Reported on 03/23/2024)  MAGNESIUM PO Take 1 tablet by mouth daily.     methocarbamol  (ROBAXIN ) 500 MG tablet Take 1 tablet (500 mg total) by mouth every 6 (six) hours as needed for muscle spasms. 30 tablet 3   NON FORMULARY Pt uses a cpap nightly     Omega-3 Fatty Acids (OMEGA 3 PO) Take 1 capsule by mouth daily.     oxyCODONE  (OXY IR/ROXICODONE ) 5 MG immediate release tablet Take 1 tablet (5 mg total) by mouth every 4 (four) hours as needed for severe pain (pain score 7-10). 15 tablet 0   oxyCODONE -acetaminophen  (PERCOCET/ROXICET) 5-325 MG tablet Take 1-2 tablets by mouth every 4 (four) hours as needed for moderate pain (pain score 4-6) or severe pain (pain score 7-10). 40 tablet 0   polyethylene glycol powder (GLYCOLAX /MIRALAX ) 17 GM/SCOOP powder Take 17 g by mouth daily. Drink 17g (1 scoop) dissolved in  water per day. 255 g 0   predniSONE  (DELTASONE ) 20 MG tablet Take 1 tablet (20 mg total) by mouth daily with breakfast. (Patient not taking: Reported on 03/23/2024) 5 tablet 0   Probiotic Product (PROBIOTIC DAILY PO) Take 1 capsule by mouth daily.     pyridoxine (B-6) 200 MG tablet Take 200 mg by mouth daily.     sulfamethoxazole -trimethoprim  (BACTRIM  DS) 800-160 MG tablet Take 1 tablet by mouth 2 (two) times daily. 6 tablet 0   traMADol  (ULTRAM ) 50 MG tablet Take 1 tablet (50 mg total) by mouth 2 (two) times daily as needed. 60 tablet 0   TURMERIC PO Take 1 capsule by mouth daily.     Current Facility-Administered Medications on File Prior to Visit  Medication Dose Route Frequency Provider Last Rate Last Admin   [START ON 06/23/2024] denosumab  (PROLIA ) injection 60 mg  60 mg Subcutaneous Once Gherghe, Cristina, MD        ALLERGIES: Allergies  Allergen Reactions   Ibuprofen -Famotidine  Other (See Comments)    Elevated BP   Pregabalin  Swelling    AMS, Swelling   Penicillins Rash    Did it involve swelling of the face/tongue/throat, SOB, or low BP? No Did it involve sudden or severe rash/hives, skin peeling, or any reaction on the inside of your mouth or nose? No Did you need to seek medical attention at a hospital or doctor's office? No When did it last happen?      30+ years If all above answers are "NO", may proceed with cephalosporin use.     FAMILY HISTORY: Family History  Problem Relation Age of Onset   Stroke Mother        2010   Heart attack Father    Cancer Paternal Grandmother    Uterine cancer Neg Hx    Bladder Cancer Neg Hx     SOCIAL HISTORY: Social History   Socioeconomic History   Marital status: Widowed    Spouse name: Not on file   Number of children: Not on file   Years of education: Not on file   Highest education level: Doctorate  Occupational History   Not on file  Tobacco Use   Smoking status: Former    Current packs/day: 0.00    Average  packs/day: 0.3 packs/day for 2.0 years (0.5 ttl pk-yrs)    Types: Cigarettes    Start date: 09/02/1965    Quit date: 09/03/1967    Years since quitting: 56.6   Smokeless tobacco: Never   Tobacco comments:    quit 48 years   Vaping Use   Vaping status: Never  Used  Substance and Sexual Activity   Alcohol use: Not Currently    Comment: weekly   Drug use: No   Sexual activity: Not Currently    Partners: Male    Birth control/protection: Surgical    Comment: hysterectomy  Other Topics Concern   Not on file  Social History Narrative   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   [place]  home in [place] since 1990   Right handed   One story home   Lives alone   Social Drivers of Health   Financial Resource Strain: Low Risk  (03/12/2023)   Overall Financial Resource Strain (CARDIA)    Difficulty of Paying Living Expenses: Not hard at all  Food Insecurity: No Food Insecurity (03/12/2023)   Hunger Vital Sign    Worried About Running Out of Food in the Last Year: Never true    Ran Out of Food in the Last Year: Never true  Transportation Needs: No Transportation Needs (03/12/2023)   PRAPARE - Administrator, Civil Service (Medical): No    Lack of Transportation (Non-Medical): No  Physical Activity: Sufficiently Active (03/12/2023)   Exercise Vital Sign    Days of Exercise per Week: 6 days    Minutes of Exercise per Session: 30 min  Stress: Stress Concern Present (03/12/2023)   [employer] of Occupational Health - Occupational Stress Questionnaire    Feeling of Stress : To some extent  Social Connections: Moderately Integrated (03/12/2023)   Social Connection and Isolation Panel    Frequency of Communication with Friends and Family: More than three times a week    Frequency of Social Gatherings with Friends and Family: More than three times a week    Attends Religious Services: More than 4 times per year    Active Member of [employer] or  Organizations: Yes    Attends Banker Meetings: More than 4 times per year    Marital Status: Widowed  Intimate Partner Violence: Not At Risk (08/14/2020)   Humiliation, Afraid, Rape, and Kick questionnaire    Fear of Current or Ex-Partner: No    Emotionally Abused: No    Physically Abused: No    Sexually Abused: No     PHYSICAL EXAM: Vitals:   04/12/24 1437  BP: (!) 146/69  Pulse: 77  SpO2: 100%   General: No acute distress Head:  Normocephalic/atraumatic Skin/Extremities: No rash, no edema Neurological Exam: alert and awake. No aphasia or dysarthria. Fund of knowledge is appropriate. Attention and concentration are normal.   Cranial nerves: Pupils equal, round. Extraocular movements intact with no nystagmus. Visual fields full.  No facial asymmetry.  Motor: Bulk and tone normal, muscle strength 5/5 on both UE and both LE, 3/5 right foot dorsiflexion. Finger to nose testing intact. Steppage gait due to right foot drop.   IMPRESSION: This is a pleasant 83 yo RH woman who had a syncopal episode on 03/03/16, then a transient episode of expressive aphasia last 03/25/16. Stroke workup was unremarkable, MRI brain did not show any acute changes, no significant stenosis seen intracranially or with carotid dopplers. Echo normal. She had another transient episode of aphasia on 07/21/16, this time followed by intense headache. No headache with previous event. Her EEG was abnormal with bilateral temporal slowing and rare left frontotemporal epileptiform discharges. 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 Repeat EEG in 09/2018 showed similar findings. She continues to do well with no similar  episodes since 2017 on low dose Lamotrigine  25mg  at bedtime. She is aware of [place] driving laws to stop driving after a seizure until 6 months seizure-free. Follow-up in 1 year, call for any changes.   Thank you for allowing me to participate in  her care.  Please do not hesitate to call for any questions or concerns.    Darice Shivers, M.D.   CC: Dr. Charlett

## 2024-04-12 NOTE — Patient Instructions (Signed)
 Good to see you doing well! Continue Lamotrigine  25mg : take 1 tablet every night. Follow-up in 1 year, call for any changes.    Seizure Precautions: 1. If medication has been prescribed for you to prevent seizures, take it exactly as directed.  Do not stop taking the medicine without talking to your doctor first, even if you have not had a seizure in a long time.   2. Avoid activities in which a seizure would cause danger to yourself or to others.  Don't operate dangerous machinery, swim alone, or climb in high or dangerous places, such as on ladders, roofs, or girders.  Do not drive unless your doctor says you may.  3. If you have any warning that you may have a seizure, lay down in a safe place where you can't hurt yourself.    4.  No driving for 6 months from last seizure, as per [place]  state law.   Please refer to the following link on the Epilepsy Foundation of America's website for more information: http://www.epilepsyfoundation.org/answerplace/Social/driving/drivingu.cfm   5.  Maintain good sleep hygiene.  6.  Contact your doctor if you have any problems that may be related to the medicine you are taking.  7.  Call 911 and bring the patient back to the ED if:        A.  The seizure lasts longer than 5 minutes.       B.  The patient doesn't awaken shortly after the seizure  C.  The patient has new problems such as difficulty seeing, speaking or moving  D.  The patient was injured during the seizure  E.  The patient has a temperature over 102 F (39C)  F.  The patient vomited and now is having trouble breathing

## 2024-04-15 ENCOUNTER — Ambulatory Visit: Admitting: Obstetrics and Gynecology

## 2024-04-15 DIAGNOSIS — G4733 Obstructive sleep apnea (adult) (pediatric): Secondary | ICD-10-CM | POA: Diagnosis not present

## 2024-04-15 DIAGNOSIS — M4182 Other forms of scoliosis, cervical region: Secondary | ICD-10-CM | POA: Diagnosis not present

## 2024-04-16 ENCOUNTER — Ambulatory Visit: Admitting: Obstetrics and Gynecology

## 2024-04-16 ENCOUNTER — Encounter: Payer: Self-pay | Admitting: Obstetrics and Gynecology

## 2024-04-16 ENCOUNTER — Other Ambulatory Visit (HOSPITAL_COMMUNITY)
Admission: RE | Admit: 2024-04-16 | Discharge: 2024-04-16 | Disposition: A | Discharge status: Home / Self Care | Source: Ambulatory Visit | Attending: Obstetrics and Gynecology | Admitting: Obstetrics and Gynecology

## 2024-04-16 VITALS — BP 114/65 | HR 76

## 2024-04-16 DIAGNOSIS — R82998 Other abnormal findings in urine: Secondary | ICD-10-CM

## 2024-04-16 DIAGNOSIS — M545 Low back pain, unspecified: Secondary | ICD-10-CM

## 2024-04-16 DIAGNOSIS — N393 Stress incontinence (female) (male): Secondary | ICD-10-CM

## 2024-04-16 DIAGNOSIS — R319 Hematuria, unspecified: Secondary | ICD-10-CM

## 2024-04-16 DIAGNOSIS — N898 Other specified noninflammatory disorders of vagina: Secondary | ICD-10-CM | POA: Diagnosis not present

## 2024-04-16 DIAGNOSIS — R300 Dysuria: Secondary | ICD-10-CM

## 2024-04-16 DIAGNOSIS — M419 Scoliosis, unspecified: Secondary | ICD-10-CM

## 2024-04-16 DIAGNOSIS — R102 Pelvic and perineal pain: Secondary | ICD-10-CM | POA: Diagnosis not present

## 2024-04-16 DIAGNOSIS — Z87440 Personal history of urinary (tract) infections: Secondary | ICD-10-CM | POA: Diagnosis not present

## 2024-04-16 DIAGNOSIS — L292 Pruritus vulvae: Secondary | ICD-10-CM

## 2024-04-16 DIAGNOSIS — N812 Incomplete uterovaginal prolapse: Secondary | ICD-10-CM | POA: Diagnosis not present

## 2024-04-16 DIAGNOSIS — M6289 Other specified disorders of muscle: Secondary | ICD-10-CM

## 2024-04-16 LAB — URINALYSIS, COMPLETE (UACMP) WITH MICROSCOPIC
Bilirubin Urine: NEGATIVE
Glucose, UA: NEGATIVE mg/dL
Ketones, ur: NEGATIVE mg/dL
Nitrite: NEGATIVE
Protein, ur: NEGATIVE mg/dL
Specific Gravity, Urine: 1.01 (ref 1.005–1.030)
pH: 6 (ref 5.0–8.0)

## 2024-04-16 LAB — POCT URINALYSIS DIP (CLINITEK)
Bilirubin, UA: NEGATIVE
Glucose, UA: NEGATIVE mg/dL
Ketones, POC UA: NEGATIVE mg/dL
Nitrite, UA: NEGATIVE
POC PROTEIN,UA: NEGATIVE
Spec Grav, UA: 1.01 (ref 1.010–1.025)
Urobilinogen, UA: 0.2 U/dL
pH, UA: 6.5 (ref 5.0–8.0)

## 2024-04-16 MED ORDER — METHENAMINE HIPPURATE 1 G PO TABS: 1.0000 g | ORAL_TABLET | Freq: Every evening | ORAL | 2 refills | Status: DC

## 2024-04-16 MED ORDER — SULFAMETHOXAZOLE-TRIMETHOPRIM 800-160 MG PO TABS: 1.0000 | ORAL_TABLET | Freq: Two times a day (BID) | ORAL | 0 refills | Status: AC

## 2024-04-16 MED ORDER — FLUCONAZOLE 150 MG PO TABS: 150.0000 mg | ORAL_TABLET | Freq: Once | ORAL | 0 refills | Status: AC

## 2024-04-16 NOTE — Progress Notes (Signed)
 [facility]   Subjective:     Chief Complaint:  Chief Complaint  Patient presents with   Pessary Check    Kaitlyn Flowers is a 83 y.o. female is here for pessary cleaning/check.   History of Present Illness: Kaitlyn Flowers is a 83 y.o. female with stage II pelvic organ prolapse and stress incontinence who presents for a pessary check. She is using a size #2 incontinence dish pessary. The pessary has been working well and she has no complaints. She is using vaginal estrogen. She denies vaginal bleeding.  Patient is having some significant back pain and pain into the pelvic floor.   Patient also endorses vaginal itching and possible UTI symptoms.   Past Medical History: Patient  has a past medical history of Anemia, Arthritis, Back pain, Cancer (HCC), Leg weakness, Neuromuscular disorder (HCC), Osteopenia, PONV (postoperative nausea and vomiting), Right leg pain, Rotator cuff tear, Seizures (HCC), Sleep apnea, Spinal cord tumor, Urinary tract infection, and Wears hearing aid.   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   Medications: She has a current medication list which includes the following prescription(s): acetaminophen , acetylcysteine, ascorbic acid, estradiol , fluconazole , lamotrigine , lidocaine , magnesium, methenamine , methocarbamol , NON FORMULARY, omega-3 fatty acids, oxycodone , oxycodone -acetaminophen , polyethylene glycol powder, prednisone , probiotic product, pyridoxine, sulfamethoxazole -trimethoprim , sulfamethoxazole -trimethoprim , tramadol , and turmeric, and the  following Facility-Administered Medications: [START ON 06/23/2024] denosumab .   Allergies: Patient is allergic to ibuprofen -famotidine , pregabalin , and penicillins.   Social History: Patient  reports that she quit smoking about 56 years ago. Her smoking use included cigarettes. She started smoking about 58 years ago. She has a 0.5 pack-year smoking history. She has never used smokeless tobacco. She reports that she does not currently use alcohol after a past usage of about 2.0 standard drinks of alcohol per week. She reports that she does not use drugs.      Objective:    Physical Exam: BP 114/65   Pulse 76   LMP  (LMP Unknown)  Gen: No apparent distress, A&O x 3. Detailed Urogynecologic Evaluation:  Pelvic Exam: Normal external female genitalia; Bartholin's and Skene's glands normal in appearance; urethral meatus with caruncle, no urethral masses but there is some slight green vaginal discharge. Aptima swab obtained.  The pessary was noted to be in place. It was removed and cleaned. Speculum exam revealed no lesions in the vagina. The pessary was replaced. It was comfortable to the patient and fit well.     Laboratory Results: Lab Results  Component Value Date   COLORU yellow 04/16/2024   CLARITYU clear 04/16/2024   GLUCOSEUR negative 04/16/2024   BILIRUBINUR negative 04/16/2024   KETONESU Negative 01/01/2024   SPECGRAV 1.010 04/16/2024   RBCUR trace-intact (A) 04/16/2024   PHUR 6.5 04/16/2024   PROTEINUR NEGATIVE 01/01/2024   UROBILINOGEN 0.2 04/16/2024   LEUKOCYTESUR Small (1+) (A) 04/16/2024     Assessment/Plan:    Assessment: Ms. Crocker is a 83 y.o. with stage II pelvic organ prolapse and stress incontinence here for a pessary check. She is doing well.  Plan: She will keep the pessary in place until next visit. She will continue to use estrogen. She will follow-up in 3 months for a pessary check or sooner as needed.   For  patient's back and pelvic floor pain, I  think PT would be good for her. I do not think traditional pelvic floor PT would be a good option as she struggled today to lay back for pessary check. I think she would benefit more for aquatic therapy to increase her core strength and balance while also reducing the pressure on her hips and spine. Placed a referral for PT at [facility] to see if this would be helpful for her.   Will treat as UTI based of symptoms and culture. Bactrim  sent to pharmacy for patient.   Vaginal swab done to rule out yeast and BV. Higher concern for yeast based on symptoms of itching. Diflucan  sent to pharmacy.  After she finishes her medication for UTI we discussed starting on prophylactic medication Hiprex  for UTI prevention.   All questions were answered.

## 2024-04-16 NOTE — Patient Instructions (Signed)
 Start antibiotic today  Take the diflucan  as well for possible yeast  Start the Hiprex  after you finish antibiotic for UTI prevention

## 2024-04-17 LAB — URINE CULTURE

## 2024-04-19 LAB — CERVICOVAGINAL ANCILLARY ONLY
Bacterial Vaginitis (gardnerella): NEGATIVE
Comment: NEGATIVE

## 2024-04-20 ENCOUNTER — Ambulatory Visit: Payer: Self-pay | Admitting: Obstetrics and Gynecology

## 2024-04-28 ENCOUNTER — Ambulatory Visit: Payer: Self-pay

## 2024-04-28 ENCOUNTER — Ambulatory Visit: Payer: Self-pay | Admitting: Internal Medicine

## 2024-04-28 NOTE — Telephone Encounter (Addendum)
 Triage incomplete, symptoms severe. Pt stating that nurse audio keeps breaking up for pt, nurse and pt agreed that nurse would call back. Nurse restarted system and attempted to contact pt back, no answer, LVM for call back to [facility] office for next steps asap. Placed in call back.   Copied from CRM #[id number]. Topic: Clinical - Red Word Triage >> Apr 28, 2024 10:53 AM Rilla NOVAK wrote: [facility] that prompted transfer to Nurse Triage: Extreme exhaustion, unable to think straight... issue with CPAP  E2C2 Pulmonary Triage - Initial Assessment Questions Chief Complaint (e.g., cough, sob, wheezing, fever, chills, sweat or additional symptoms) *Go to specific symptom protocol after initial questions. Issues with CPAP Unable to think straight throughout the day not just in morning, foggy and confused each day for past couple weeks Not more SOB than usual No chest pain, dizziness Did not answer if weaker than usual Do have really hard time waking up Just exhausted, hard to make myself keep going, I do most of time but don't feel good Pt alert and oriented   How long have symptoms been present? Couple weeks, keep working with therapist at [facility], they're trying to fix machine, they keep saying mask must be leaking but not leaking, machine tells me every day that the seal is good, not clear about what's happening No numbness/tingling Not really changes in speech   Pt stating that nurse audio keeps breaking up for pt, nurse and pt agreed that nurse would call back. Nurse restarted system and attempted to contact pt back, no answer, LVM for call back to [facility] office for next steps asap. Placed in call back.

## 2024-04-28 NOTE — Telephone Encounter (Signed)
 Per pt chart, pt called back and is speaking to another triage nurse.

## 2024-04-28 NOTE — Telephone Encounter (Signed)
 FYI Only or Action Required?: FYI only for provider.  Patient is followed in Pulmonology for OSA, last seen on 03/23/2024 by [name], MD.  Called Nurse Triage reporting No chief complaint on file..  Symptoms began several weeks ago.  Interventions attempted: Home oxygen use and Increased fluids/rest.  Symptoms are: gradually worsening.  Triage Disposition: See PCP When Office is Open (Within 3 Days)  Patient/caregiver understands and will follow disposition?: Yes   Copied from CRM #[id number]. Topic: Clinical - Red Word Triage >> Apr 28, 2024 10:53 AM Rilla NOVAK wrote: [facility] that prompted transfer to Nurse Triage: Extreme exhaustion, unable to think straight... issue with CPAP >> Apr 28, 2024 11:07 AM Russell PARAS wrote: Pt was speaking with Damien in [place] and lost connection Reason for Disposition  [1] Fatigue (i.e., tires easily, decreased energy) AND [2] persists > 1 week  Answer Assessment - Initial Assessment Questions 1. DESCRIPTION: Describe how you are feeling.     Extremely exhausted  2. SEVERITY: How bad is it?  Can you stand and walk?     Interferes with daily activities, daily disturbances  3. ONSET: When did these symptoms begin? (e.g., hours, days, weeks, months)     2 weeks ago  4. CAUSE: What do you think is causing the weakness or fatigue? (e.g., not drinking enough fluids, medical problem, trouble sleeping)     Trouble sleeping, CPAP Machine has been malfunctioning  5. NEW MEDICINES:  Have you started on any new medicines recently? (e.g., opioid pain medicines, benzodiazepines, muscle relaxants, antidepressants, antihistamines, neuroleptics, beta blockers)     Denies any new medications  6. OTHER SYMPTOMS: Do you have any other symptoms? (e.g., chest pain, fever, cough, SOB, vomiting, diarrhea, bleeding, other areas of pain)     Brain fog, Lack of motivation  7. PREGNANCY: Is there any chance you are pregnant? When was your last  menstrual period?     No and No  Protocols used: Weakness (Generalized) and Fatigue-A-AH

## 2024-04-29 NOTE — Telephone Encounter (Signed)
 Left pt message to find out more information but did tell her if she thinks her CPAP machine is malfunctioning then she should reach out to [facility]. To give us  a call if something further is needed

## 2024-05-05 NOTE — Progress Notes (Signed)
 HPI F for sleep evaluation courtesy of Dr Charlett with concerns of snoring, daytime tiredness, witnessed apnea when son visited. Medical problem list includes Autonomic Dysfunction, Epilepsy/ Lamictal , R leg weakness, Osteoporosis, Spondylolisthesis,  HST 05/01/23- AHI 34.8/hr, snoring with desaturation to 57%/ 73 minutes</= 88%, body weight 103 lbs PFT 07/03/23- mild Diffusion defect  ==========================================================================================================     03/23/24- 82 yoF followed for OSA, Dyspnea on Exertion, complicated by Scoliosis, Autonomic Dysfunction, Epilepsy/ Lamictal , R leg weakness, Osteoporosis, Spondylolisthesis,  Medical problem list includes Autonomic Dysfunction, Epilepsy/ Lamictal , R leg weakness, Osteoporosis, Spondylolisthesis,  HST 05/01/23- AHI 34.8/hr, snoring with desaturation to 57%/ 73 minutes</= 88%, body weight 103 lbs CPAP auto 4-15/ [facility]   ordered 06/05/23 Download compliance- 73%, AHI 15/hr- mostly obstructive  Pressure is ranging 9.4-9.7 Body Weight today- Changed to 4-15  2 weeks ago. Fatigue  ------Has headache in the morning when she awakes Discussed the use of AI scribe software for clinical note transcription with the patient, who gave verbal consent to proceed.  History of Present Illness   Kaitlyn Flowers is an 83 year old female with sleep apnea who presents with persistent morning headaches and breakthrough apneas.  She experiences approximately fifteen apneas per hour, leading to less restful sleep and morning headaches. Her CPAP machine is set to range between four and ten centimeters of water pressure, predominantly operating around 9.6 to 10 centimeters. She consistently uses the machine despite these issues. We had ordered change to auto 4-15, but this didn't happen.  She has tried a different mask as advised by a therapist at [facility], which she likes and reports no leaks with the new hybrid mask that covers  her nose and mouth.  She has no new issues with breathing or chest discomfort.     Assessment and Plan:    Obstructive Sleep Apnea Obstructive sleep apnea with increased apneas, averaging fifteen per hour, causing less restful sleep and morning headaches. Current CPAP settings remain unchanged, potentially contributing to symptoms. - Contact [facility] to adjust CPAP settings as prescribed. - Ensure communication with [facility] branch of [facility] for necessary changes.     Scoliosis Not affecting comfort enough to impact sleep currently  05/06/24- ACUTE- 82 yoF followed for OSA, Dyspnea on Exertion, complicated by Scoliosis, Autonomic Dysfunction, Epilepsy/ Lamictal , R leg weakness, Osteoporosis, Spondylolisthesis,  Medical problem list includes Autonomic Dysfunction, Epilepsy/ Lamictal , R leg weakness, Osteoporosis, Spondylolisthesis,  HST 05/01/23- AHI 34.8/hr, snoring with desaturation to 57%/ 73 minutes</= 88%, body weight 103 lbs CPAP auto 4-15/ [facility]   ordered 06/05/23, current setting 03/23/24 Download compliance- 80%, AHI 3.4/hr Body Weight today- UC for a fall on 7/14. CT head Neg. Getting very frustrated about water in hose. Discussed the use of AI scribe software for clinical note transcription with the patient, who gave verbal consent to proceed.  History of Present Illness   Kaitlyn Flowers is an 83 year old female who presents with issues related to her CPAP machine.  She experiences erratic condensation in the CPAP hose, with water pouring out and a popping sound, despite unchanged settings. Lowering the humidity results in xerostomia. She has not attempted using the machine without the humidifier.  Her machine is set between four and fifteen centimeters of water pressure, centering around fourteen, with less than four breakthroughs per hour. She questions discrepancies between her machine's perfect seal indication and the home care company's leakage reports. She has communicated  with the home care company by phone but has not physically taken the machine to  her.     Assessment and Plan:    Obstructive sleep apnea syndrome with possible CPAP equipment malfunction Intermittent CPAP machine issues due to condensation in the hose. Room humidity and machine position may contribute. Effective pressure settings noted. Discrepancy in mask leakage reports. - Advised try turning off the humidifier to reduce condensation. - Suggested lowering the CPAP machine to allow condensation drainage. - Instructed her to bring the CPAP machine to the home care company for inspection and servicing. - Discussed possibility of using a loaner machine if the current machine is faulty. - Consider heated hoses if compatible with the machine.  Dry mouth associated with CPAP use Experiences dry mouth with reduced CPAP humidity. Common side effect with low or no humidification. - Discussed using the humidifier for comfort if dry mouth persists.  Fatigue related to sleep apnea and CPAP issues Fatigue linked to CPAP malfunctions, affecting sleep quality. Well-rested when machine functions correctly. Current issues are making her anxious. - Address CPAP equipment malfunction to improve sleep quality and reduce fatigue.        ROS-see HPI   + = positive Constitutional:    weight loss, night sweats, fevers, chills, fatigue, lassitude. HEENT:    headaches, difficulty swallowing, tooth/dental problems, sore throat,       sneezing, itching, ear ache, nasal congestion, post nasal drip, snoring CV:    chest pain, orthopnea, PND, swelling in lower extremities, anasarca,                                   dizziness, palpitations Resp:   shortness of breath with exertion or at rest.                productive cough,   non-productive cough, coughing up of blood.              change in color of mucus.  wheezing.   Skin:    rash or lesions. GI:  No-   heartburn, indigestion, abdominal pain, nausea, vomiting,  diarrhea,                 change in bowel habits, loss of appetite GU: dysuria, change in color of urine, no urgency or frequency.   flank pain. MS:   joint pain, stiffness, decreased range of motion, back pain. Neuro-     nothing unusual Psych:  change in mood or affect.  depression or anxiety.   memory loss.  OBJ- Physical Exam    +petite General- Alert, Oriented, Affect-appropriate, Distress- none acute Skin- rash-none, lesions- none, excoriation- none Lymphadenopathy- none Head- atraumatic            Eyes- Gross vision intact, PERRLA, conjunctivae and secretions clear            Ears- Hearing, canals-normal            Nose- Clear, no-Septal dev, mucus, polyps, erosion, perforation             Throat- Mallampati III-IV , mucosa clear , drainage- none, tonsils-absent, +teeth, Neck- flexible , trachea midline, no stridor , thyroid  nl, carotid no bruit Chest - symmetrical excursion , unlabored           Heart/CV- RRR , no murmur , no gallop  , no rub, nl s1 s2                           -  JVD- none , edema- none, stasis changes- none, varices- none           Lung- clear to P&A, wheeze- none, cough- none , dullness-none, rub- none           Chest wall- +scoliosis- able to sit more comfortably at this visit Abd-  Br/ Gen/ Rectal- Not done, not indicated Extrem- cyanosis- none, clubbing, none, atrophy- none, strength- nl Neuro- grossly intact to observation

## 2024-05-06 ENCOUNTER — Encounter: Payer: Self-pay | Admitting: Internal Medicine

## 2024-05-06 ENCOUNTER — Ambulatory Visit: Admitting: Internal Medicine

## 2024-05-06 VITALS — BP 116/76 | HR 70 | Temp 97.7°F | Ht 61.0 in | Wt 104.0 lb

## 2024-05-06 DIAGNOSIS — G4733 Obstructive sleep apnea (adult) (pediatric): Secondary | ICD-10-CM | POA: Diagnosis not present

## 2024-05-06 NOTE — Patient Instructions (Signed)
 Order- DME [facility]- please bring patient in for face to face to service CPAP for malfunctioning humidifier.

## 2024-05-09 ENCOUNTER — Encounter: Payer: Self-pay | Admitting: Internal Medicine

## 2024-05-16 DIAGNOSIS — G4733 Obstructive sleep apnea (adult) (pediatric): Secondary | ICD-10-CM | POA: Diagnosis not present

## 2024-05-17 NOTE — Telephone Encounter (Signed)
 Prolia  VOB initiated via [email or web address]  Next Prolia  inj DUE: 06/24/24

## 2024-05-27 NOTE — Telephone Encounter (Signed)
 [employer] - Prior Authorization REQUIRED for [employer]

## 2024-05-29 DIAGNOSIS — G4733 Obstructive sleep apnea (adult) (pediatric): Secondary | ICD-10-CM | POA: Diagnosis not present

## 2024-06-03 NOTE — Telephone Encounter (Signed)
 Prior Authorization initiated for PROLIA  via [email or web address] KEY: [id number]

## 2024-06-04 ENCOUNTER — Ambulatory Visit: Payer: Self-pay | Admitting: Internal Medicine

## 2024-06-04 NOTE — Telephone Encounter (Signed)
 FYI Only or Action Required?: FYI only for provider.  Patient is followed in Pulmonology for sleep apnea, last seen on 05/06/2024 by [name], MD.  Called Nurse Triage reporting CPAP problem.  Symptoms began a week ago.  Interventions attempted: Nothing.  Symptoms are: gradually worsening.  Triage Disposition: See PCP When Office is Open (Within 3 Days)  Patient/caregiver understands and will follow disposition?: Yes     Copied from CRM #[id number]. Topic: Clinical - Red Word Triage >> Jun 04, 2024 12:06 PM [name] wrote: [facility] that prompted transfer to Nurse Triage: Experiencing symptoms from her CPAP machine - dry throat in the morning, alot of gas, periodic ear aches      Reason for Disposition  Nursing judgment or information in reference  Answer Assessment - Initial Assessment Questions 1. REASON FOR CALL: What is your main concern right now?     Is having symptoms she believes are due to her CPAP 2. ONSET: When did the symptoms start?     About a month  3. SEVERITY: How bad are the symptoms?     Mild 4. OTHER SYMPTOMS: Do you have any other new symptoms?     Dry throat in the morning, gas, intermittent earaches  Protocols used: No Guideline Available-A-AH

## 2024-06-07 ENCOUNTER — Encounter (HOSPITAL_BASED_OUTPATIENT_CLINIC_OR_DEPARTMENT_OTHER): Payer: Self-pay

## 2024-06-07 ENCOUNTER — Ambulatory Visit (HOSPITAL_BASED_OUTPATIENT_CLINIC_OR_DEPARTMENT_OTHER)

## 2024-06-07 VITALS — BP 129/70 | HR 86 | Ht 61.0 in | Wt 105.0 lb

## 2024-06-07 DIAGNOSIS — G4733 Obstructive sleep apnea (adult) (pediatric): Secondary | ICD-10-CM | POA: Diagnosis not present

## 2024-06-07 NOTE — Progress Notes (Signed)
 @Patient  ID: Kaitlyn Flowers, female    DOB: 1940/11/09, 83 y.o.   MRN: [id number]  Chief Complaint  Patient presents with   Follow-up    OSA     Referring provider: Charlett Apolinar POUR, MD  HPI: Kaitlyn Flowers is an 83 y/o female with PMH of OSA on CPAP who presents today for follow up.  She reports that she has been having some issues with her CPAP machine.  She states that when she wears that she feels very good the next day but does not feel well if she does not wear it.  We reviewed her compliance download which indicated a residual AHI of 1.7 with an elevated leak profile of around 29 in the 95th percentile.  She reports that she has no issues with leak.  We reviewed this over time and indicates that there are most nights that she has an acceptable profile but some nights where it is quite elevated which drags the average up.  She reports that for the most part she is able to tolerate machine.  She was last seen in office on 05/06/2024 and reports that what ever changes were made at that time or tolerated well.  She reports today because she had an episode where the machine would turn itself off.  She went into the settings and changed some of the settings and last night was able to wear it all night without any issue.  She is unsure what she changed on the machine.  Today we looked at her machine and her settings have not changed from previous and are consistent with what she was on before.  She denies any headaches, fever, chills, weight changes, chest pain, palpitations or any other complaints.  TEST/EVENTS :  HST 05/01/23- AHI 34.8/hr, snoring with desaturation to 57%/ 73 minutes</= 88% Allergies  Allergen Reactions   Ibuprofen -Famotidine  Other (See Comments)    Elevated BP   Pregabalin  Swelling    AMS, Swelling   Penicillins Rash    Did it involve swelling of the face/tongue/throat, SOB, or low BP? No Did it involve sudden or severe rash/hives, skin peeling, or any reaction on the  inside of your mouth or nose? No Did you need to seek medical attention at a hospital or doctor's office? No When did it last happen?      30+ years If all above answers are "NO", may proceed with cephalosporin use.     Immunization History  Administered Date(s) Administered   Fluad Quad(high Dose 65+) 08/11/2019, 05/21/2022   Fluad Trivalent(High Dose 65+) 07/24/2023   INFLUENZA, HIGH DOSE SEASONAL PF 07/18/2014, 05/23/2015, 06/21/2016, 09/07/2018   Influenza Split 05/29/2012   Influenza Whole 06/30/2007   Influenza,inj,Quad PF,6+ Mos 06/03/2017   Moderna Covid-19 Vaccine Bivalent Booster 79yrs & up 08/08/2021   PFIZER(Purple Top)SARS-COV-2 Vaccination 12/16/2019, 01/10/2020, 08/02/2020, 02/12/2021   Pneumococcal Conjugate-13 06/21/2016   Pneumococcal Polysaccharide-23 12/18/2010   Tdap 12/18/2010, 03/22/2024   Zoster Recombinant(Shingrix) 06/13/2020, 08/13/2020   Zoster, Live 06/12/2012    Past Medical History:  Diagnosis Date   Anemia    Arthritis    Back pain    Cancer (HCC)    skin nose   Leg weakness    Neuromuscular disorder (HCC)    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   Urinary tract infection    Wears hearing aid     Tobacco History: Social History   Tobacco Use  Smoking Status Former   Current packs/day: 0.00   Average packs/day: 0.3 packs/day for 2.0 years (0.5 ttl pk-yrs)   Types: Cigarettes   Start date: 09/02/1965   Quit date: 09/03/1967   Years since quitting: 56.8  Smokeless Tobacco Never  Tobacco Comments   quit 48 years    Counseling given: Not Answered Tobacco comments: quit 48 years    Outpatient  Medications Prior to Visit  Medication Sig Dispense Refill   Ascorbic Acid (VITAMIN C PO) Take 1 tablet by mouth daily.     estradiol  (ESTRACE ) 0.1 MG/GM vaginal cream Place 0.5g nightly for two weeks then twice a week after 42.5 g 11   lamoTRIgine  (LAMICTAL ) 25 MG tablet Take 1 tablet every evening 90 tablet 4   MAGNESIUM PO Take 1 tablet by mouth daily.     NON FORMULARY Pt uses a cpap nightly     pyridoxine (B-6) 200 MG tablet Take 200 mg by mouth daily.     traMADol  (ULTRAM ) 50 MG tablet Take 1 tablet (50 mg total) by mouth 2 (two) times daily as needed. 60 tablet 0   TURMERIC PO Take 1 capsule by mouth daily.     methocarbamol  (ROBAXIN ) 500 MG tablet Take 1 tablet (500 mg total) by mouth every 6 (six) hours as needed for muscle spasms. 30 tablet 3   Facility-Administered Medications Prior to Visit  Medication Dose Route Frequency Provider Last Rate Last Admin   [START ON 06/23/2024] denosumab  (PROLIA ) injection 60 mg  60 mg Subcutaneous Once Gherghe, Cristina, MD         Review of Systems: As per HPI  Constitutional:   No  weight loss, night sweats,  Fevers, chills, fatigue, or  lassitude.  HEENT:   No headaches,  Difficulty swallowing,  Tooth/dental problems, or  Sore throat,                No sneezing, itching, ear ache, nasal congestion, post nasal drip,   CV:  No chest pain,  Orthopnea, PND, swelling in lower extremities, anasarca, dizziness, palpitations, syncope.   GI  No heartburn, indigestion, abdominal pain, nausea, vomiting, diarrhea, change in bowel habits, loss of appetite, bloody stools.   Resp: No shortness of breath with exertion or at rest.  No excess mucus, no productive cough,  No non-productive cough,  No coughing up of blood.  No change in color of mucus.  No wheezing.  No chest wall deformity  Skin: no rash or lesions.  GU: no dysuria, change in color of urine, no urgency or frequency.  No flank pain, no hematuria   MS:  No joint pain or swelling.  No  decreased range of motion.  No back pain.    Physical Exam  BP 129/70   Pulse 86   Ht 5' 1 (1.549 m)   Wt 105 lb (47.6 kg)   LMP  (LMP Unknown)   SpO2 98%   BMI 19.84 kg/m   GEN: A/Ox3; pleasant , NAD, well nourished    HEENT:  [place]/AT,  EACs-clear, TMs-wnl, NOSE-clear, THROAT-clear, no lesions, no postnasal drip or exudate noted.   NECK:  Supple w/  fair ROM; no JVD; normal carotid impulses w/o bruits; no thyromegaly or nodules palpated; no lymphadenopathy.    RESP  Clear  P & A; w/o, wheezes/ rales/ or rhonchi. no accessory muscle use, no dullness to percussion  CARD:  RRR, no m/r/g, no peripheral edema, pulses intact, no cyanosis or clubbing.  GI:   Soft & nt; nml bowel sounds; no organomegaly or masses detected.   Musco: Warm bil, no deformities or joint swelling noted.   Neuro: alert, no focal deficits noted.    Skin: Warm, no lesions or rashes    Lab Results:  CBC    Component Value Date/Time   WBC 7.3 11/20/2023 1430   RBC 3.09 (L) 11/20/2023 1430   HGB 10.5 (L) 11/20/2023 1430   HCT 31.9 (L) 11/20/2023 1430   PLT 220 11/20/2023 1430   MCV 103.2 (H) 11/20/2023 1430   MCH 34.0 11/20/2023 1430   MCHC 32.9 11/20/2023 1430   RDW 14.2 11/20/2023 1430   LYMPHSABS 2.8 03/22/2022 1308   MONOABS 0.6 03/22/2022 1308   EOSABS 0.1 03/22/2022 1308   BASOSABS 0.0 03/22/2022 1308    BMET    Component Value Date/Time   NA 138 11/20/2023 1430   NA 133 (A) 02/19/2017 0000   K 4.7 11/20/2023 1430   CL 104 11/20/2023 1430   CO2 27 11/20/2023 1430   GLUCOSE 87 11/20/2023 1430   BUN 20 11/20/2023 1430   BUN 16 02/19/2017 0000   CREATININE 0.82 11/20/2023 1430   CREATININE 0.94 06/20/2023 1454   CALCIUM 9.1 11/20/2023 1430   GFRNONAA >60 11/20/2023 1430   GFRNONAA 70 04/29/2018 0913   GFRAA >60 09/19/2019 1045   GFRAA 81 04/29/2018 0913    BNP    Component Value Date/Time   BNP 98.9 08/09/2016 1232    ProBNP No results found for:  PROBNP  Imaging: No results found.  Administration History     None          Latest Ref Rng & Units 07/03/2023    4:06 PM  PFT Results  FVC-Pre L 1.91   FVC-Predicted Pre % 91   FVC-Post L 2.06   FVC-Predicted Post % 98   Pre FEV1/FVC % % 80   Post FEV1/FCV % % 73   FEV1-Pre L 1.53   FEV1-Predicted Pre % 99   FEV1-Post L 1.49   DLCO uncorrected ml/min/mmHg 11.27   DLCO UNC% % 69   DLCO corrected ml/min/mmHg 11.27   DLCO COR %Predicted % 69   DLVA Predicted % 86   TLC L 4.26   TLC % Predicted % 95   RV % Predicted % 98     No results found for: NITRICOXIDE   Assessment & Plan:   Assessment & Plan OSA (obstructive sleep apnea) - Overall compliance looks good and she has been wearing the machine for 7 to 8 hours per night. -Plan to have her continue on current settings with goal of wearing the machine at least 4 to 6 hours per night; she is already exceeding this. - If she continues to have issues with the machine shutting off on her she was instructed to contact her DME company for further troubleshooting.  There were no abnormalities with the settings seen at this visit.   Follow up as needed.  Candis Dandy, PA-C 06/07/2024

## 2024-06-07 NOTE — Assessment & Plan Note (Signed)
-   Overall compliance looks good and she has been wearing the machine for 7 to 8 hours per night. -Plan to have her continue on current settings with goal of wearing the machine at least 4 to 6 hours per night; she is already exceeding this. - If she continues to have issues with the machine shutting off on her she was instructed to contact her DME company for further troubleshooting.  There were no abnormalities with the settings seen at this visit.

## 2024-06-07 NOTE — Telephone Encounter (Signed)
 Pt has acute ov 10/6 with Candis, [place].

## 2024-06-08 ENCOUNTER — Ambulatory Visit: Admitting: Internal Medicine

## 2024-06-08 NOTE — Telephone Encounter (Addendum)
 [employer]  Prior Authorization for PROLIA  APPROVED PA# [id number] Valid: 11/12/19-09/01/25

## 2024-06-10 ENCOUNTER — Ambulatory Visit (INDEPENDENT_AMBULATORY_CARE_PROVIDER_SITE_OTHER): Admitting: Family Medicine

## 2024-06-10 ENCOUNTER — Encounter: Payer: Self-pay | Admitting: Family Medicine

## 2024-06-10 DIAGNOSIS — Z0000 Encounter for general adult medical examination without abnormal findings: Secondary | ICD-10-CM

## 2024-06-10 NOTE — Progress Notes (Signed)
 PATIENT CHECK-IN and HEALTH RISK ASSESSMENT QUESTIONNAIRE:  -completed by phone/video for upcoming Medicare Preventive Visit    Pre-Visit Check-in: 1)Vitals (height, wt, BP, etc) - record in vitals section for visit on day of visit Request home vitals (wt, BP, etc.) and enter into vitals, THEN update Vital Signs SmartPhrase below at the top of the HPI. See below.  2)Review and Update Medications, Allergies PMH, Surgeries, Social history in Epic 3)Hospitalizations in the last year with date/reason? Yes- Decompression surgery spine   4)Review and Update Care Team (patient's specialists) in Epic 5) Complete PHQ9 in Epic  6) Complete Fall Screening in Epic 7)Review all Health Maintenance Due and order if not done.  Medicare Wellness Patient Questionnaire:  Answer theses question about your habits: How often do you have a drink containing alcohol? 2-3 weekly  How many drinks containing alcohol do you have on a typical day when you are drinking?1 How often do you have six or more drinks on one occasion? N/a Have you ever smoked? Yes Quit date if applicable? N/a  How many packs a day do/did you smoke?  Less than 1  Do you use smokeless tobacco?no Do you use an illicit drugs? no On average, how many days per week do you engage in moderate to strenuous exercise (like a brisk walk)? 7 days  On average, how many minutes do you engage in exercise at this level? 30 Typical breakfast: Granola and Fruits Typical lunch: Varies, Sandwich, veggies Typical dinner: Varies, veggies, fish, tofu Typical snacks: Fruit,   Beverages:  Water, Coffee   Answer theses question about your everyday activities: Can you perform most household chores? yes Are you deaf or have significant trouble hearing? no Do you feel that you have a problem with memory? Yes  Do you feel safe at home? Yes  Last dentist visit? 2 months ago  8. Do you have any difficulty performing your everyday activities?no Are you having any  difficulty walking, taking medications on your own, and or difficulty managing daily home needs?no  Do you have difficulty walking or climbing stairs?yes - just needs to hold on Do you have difficulty dressing or bathing?no Do you have difficulty doing errands alone such as visiting a doctor's office or shopping? no Do you currently have any difficulty preparing food and eating?no Do you currently have any difficulty using the toilet?no Do you have any difficulty managing your finances?no Do you have any difficulties with housekeeping of managing your housekeeping?yes some    Do you have Advanced Directives in place (Living Will, Healthcare Power or Attorney)?  Yes    Last eye Exam and location? 1 year ago    Do you currently use prescribed or non-prescribed narcotic or opioid pain medications? Yes - uses tramadol  a few a day  Do you have a history or close family history of breast, ovarian, tubal or peritoneal cancer or a family member with BRCA (breast cancer susceptibility 1 and 2) gene mutations? No      ----------------------------------------------------------------------------------------------------------------------------------------------------------------------------------------------------------------------  Because this visit was a virtual/telehealth visit, some criteria may be missing or patient reported. Any vitals not documented were not able to be obtained and vitals that have been documented are patient reported.    MEDICARE ANNUAL PREVENTIVE VISIT WITH PROVIDER: (Welcome to Medicare, initial annual wellness or annual wellness exam)  Virtual Visit via Video Note  I connected with Kaitlyn Flowers on 06/10/24 by a video enabled telemedicine application and verified that I am speaking with the correct person using  two identifiers.  Location patient: home Location provider:work or home office Persons participating in the virtual visit: patient, provider  Concerns  and/or follow up today: some carpal tunnel issues she deals with on and off - uses wrist brace. Deals with some chronic pain and takes a few tramadol  per day (2-3)and feels doesn't help much and is concerned about side effects and wants to stop if can.     See HM section in Epic for other details of completed HM.    ROS: negative for report of fevers, unintentional weight loss, vision changes, vision loss, hearing loss or change, chest pain, sob, hemoptysis, melena, hematochezia, hematuria, falls, bleeding or bruising, thoughts of suicide or self harm, memory loss  Patient-completed extensive health risk assessment - reviewed and discussed with the patient: See Health Risk Assessment completed with patient prior to the visit either above or in recent phone note. This was reviewed in detailed with the patient today and appropriate recommendations, orders and referrals were placed as needed per Summary below and patient instructions.   Review of Medical History: -PMH, PSH, Family History and current specialty and care providers reviewed and updated and listed below   Patient Care Team: Charlett Apolinar POUR, MD as PCP - Diedre Colon Shove, MD (Neurosurgery) Luiz Channel, MD as Consulting Physician (Infectious Diseases) Georjean Darice HERO, MD as Consulting Physician (Neurology) Liane Sharyne MATSU, [facility] (Inactive) as Pharmacist (Pharmacist)   Past Medical History:  Diagnosis Date   Anemia    Arthritis    Back pain    Cancer [facility])    skin nose   Leg weakness    Neuromuscular disorder (HCC)    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   Urinary tract  infection    Wears hearing aid     Past Surgical History:  Procedure Laterality Date   ABDOMINAL EXPOSURE N/A 05/17/2019   Procedure: ABDOMINAL EXPOSURE;  Surgeon: Eliza Lonni RAMAN, MD;  Location: [facility] OR;  Service: Vascular;  Laterality: N/A;   ABDOMINAL HYSTERECTOMY  1973   1/2 of ovary left cyst on ovary   ANTERIOR CERVICAL DECOMP/DISCECTOMY FUSION N/A 11/27/2023   Procedure: ANTERIOR CERVICAL DECOMPRESSION/DISCECTOMY FUSION 3 LEVELS;  Surgeon: Colon Shove, MD;  Location: [facility] OR;  Service: Neurosurgery;  Laterality: N/A;  Anterior Cervical Discectomy Fusion - Cervical Four-Cervical Five - Cervical Five-Cervical Six - Cervical Six-Cervical Seven   ANTERIOR LUMBAR FUSION N/A 05/17/2019   Procedure: Lumbar five Sacral one  Anterior lumbar interbody fusion;  Surgeon: Colon Shove, MD;  Location: [facility] OR;  Service: Neurosurgery;  Laterality: N/A;   APPENDECTOMY  1973   removed with hysterectomy   COLONOSCOPY     LUMBAR WOUND DEBRIDEMENT  12/06/2011   Procedure: LUMBAR WOUND DEBRIDEMENT;  Surgeon: Shove Colon, MD;  Location: [facility] NEURO ORS;  Service: Neurosurgery;  Laterality: N/A;  Repair of Pseudomeningocele Lumbar wound   shoulder rotator culff  Left 2006   SPINAL CORD STIMULATOR INSERTION  2019   at [facility] and removed 48 hours later due to extreme pain   spinal cord tumor  06/2011  TONSILLECTOMY     As a child    Social History   Socioeconomic History   Marital status: Widowed    Spouse name: Not on file   Number of children: Not on file   Years of education: Not on file   Highest education level: Doctorate  Occupational History   Not on file  Tobacco Use   Smoking status: Former    Current packs/day: 0.00    Average packs/day: 0.3 packs/day for 2.0 years (0.5 ttl pk-yrs)    Types: Cigarettes    Start date: 09/02/1965    Quit date: 09/03/1967    Years since quitting: 56.8   Smokeless tobacco: Never   Tobacco comments:    quit 48 years   Vaping Use   Vaping status: Never  Used  Substance and Sexual Activity   Alcohol use: Not Currently    Alcohol/week: 2.0 standard drinks of alcohol    Types: 2 Glasses of wine per week    Comment: weekly   Drug use: No   Sexual activity: Not Currently    Partners: Male    Birth control/protection: Surgical    Comment: hysterectomy  Other Topics Concern   Not on file  Social History Narrative   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   [place]  home in [place] since 1990   Right handed   One story home   Lives alone   Social Drivers of Health   Financial Resource Strain: Low Risk  (06/10/2024)   Overall Financial Resource Strain (CARDIA)    Difficulty of Paying Living Expenses: Not hard at all  Food Insecurity: No Food Insecurity (06/10/2024)   Hunger Vital Sign    Worried About Running Out of Food in the Last Year: Never true    Ran Out of Food in the Last Year: Never true  Transportation Needs: No Transportation Needs (06/10/2024)   PRAPARE - Administrator, Civil Service (Medical): No    Lack of Transportation (Non-Medical): No  Physical Activity: Sufficiently Active (06/10/2024)   Exercise Vital Sign    Days of Exercise per Week: 7 days    Minutes of Exercise per Session: 30 min  Stress: No Stress Concern Present (06/10/2024)   [employer] of Occupational Health - Occupational Stress Questionnaire    Feeling of Stress: Not at all  Recent Concern: Stress - Stress Concern Present (06/10/2024)   [employer] of Occupational Health - Occupational Stress Questionnaire    Feeling of Stress: To some extent  Social Connections: Moderately Integrated (06/10/2024)   Social Connection and Isolation Panel    Frequency of Communication with Friends and Family: More than three times a week    Frequency of Social Gatherings with Friends and Family: More than three times a week    Attends Religious Services: More than 4 times per year    Active Member of [employer] or  Organizations: Yes    Attends Banker Meetings: More than 4 times per year    Marital Status: Widowed  Intimate Partner Violence: Not At Risk (06/10/2024)   Humiliation, Afraid, Rape, and Kick questionnaire    Fear of Current or Ex-Partner: No    Emotionally Abused: No    Physically Abused: No    Sexually Abused: No    Family History  Problem Relation Age of Onset   Stroke Mother        2010   Heart attack Father    Cancer Paternal Grandmother  Uterine cancer Neg Hx    Bladder Cancer Neg Hx     Current Outpatient Medications on File Prior to Visit  Medication Sig Dispense Refill   estradiol  (ESTRACE ) 0.1 MG/GM vaginal cream Place 0.5g nightly for two weeks then twice a week after 42.5 g 11   lamoTRIgine  (LAMICTAL ) 25 MG tablet Take 1 tablet every evening 90 tablet 4   MAGNESIUM PO Take 1 tablet by mouth daily.     NON FORMULARY Pt uses a cpap nightly     pyridoxine (B-6) 200 MG tablet Take 200 mg by mouth daily.     traMADol  (ULTRAM ) 50 MG tablet Take 1 tablet (50 mg total) by mouth 2 (two) times daily as needed. 60 tablet 0   Current Facility-Administered Medications on File Prior to Visit  Medication Dose Route Frequency Provider Last Rate Last Admin   [START ON 06/23/2024] denosumab  (PROLIA ) injection 60 mg  60 mg Subcutaneous Once Gherghe, Cristina, MD        Allergies  Allergen Reactions   Ibuprofen -Famotidine  Other (See Comments)    Elevated BP   Pregabalin  Swelling    AMS, Swelling   Penicillins Rash    Did it involve swelling of the face/tongue/throat, SOB, or low BP? No Did it involve sudden or severe rash/hives, skin peeling, or any reaction on the inside of your mouth or nose? No Did you need to seek medical attention at a hospital or doctor's office? No When did it last happen?      30+ years If all above answers are "NO", may proceed with cephalosporin use.        Physical Exam Vitals requested from patient and listed below if patient  had equipment and was able to obtain at home for this virtual visit: There were no vitals filed for this visit. Estimated body mass index is 19.84 kg/m as calculated from the following:   Height as of 06/07/24: 5' 1 (1.549 m).   Weight as of 06/07/24: 105 lb (47.6 kg).  EKG (optional): deferred due to virtual visit  GENERAL: alert, oriented, no acute distress detected, full vision exam deferred due to pandemic and/or virtual encounter  HEENT: atraumatic, conjunttiva clear, no obvious abnormalities on inspection of external nose and ears  NECK: normal movements of the head and neck  LUNGS: on inspection no signs of respiratory distress, breathing rate appears normal, no obvious gross SOB, gasping or wheezing  CV: no obvious cyanosis  MS: moves all visible extremities without noticeable abnormality  PSYCH/NEURO: pleasant and cooperative, no obvious depression or anxiety, speech and thought processing grossly intact, Cognitive function grossly intact  Flowsheet Row Clinical Support from 06/10/2024 in [facility]  PHQ-9 Total Score 3        06/10/2024    9:43 AM 05/15/2023   12:17 PM 03/20/2023    1:49 PM 09/10/2022    3:04 PM 04/29/2022    9:34 AM  Depression screen PHQ 2/9  Decreased Interest 0 0 0  0  Down, Depressed, Hopeless 0 0 0  0  PHQ - 2 Score 0 0 0  0  Altered sleeping 0  2 1   Tired, decreased energy 0  1 1   Change in appetite 0  1    Feeling bad or failure about yourself  0  0    Trouble concentrating 3  0    Moving slowly or fidgety/restless 0  0    Suicidal thoughts 0  0  PHQ-9 Score 3  4    Difficult doing work/chores Not difficult at all           03/12/2023   12:55 PM 03/20/2023    1:49 PM 05/15/2023   12:17 PM 04/12/2024    2:39 PM 06/10/2024    9:45 AM  Fall Risk  Falls in the past year? 1 1  1 1   Was there an injury with Fall? 1 0 0 1 1  Fall Risk Category Calculator 3 2  2 2   Patient at Risk for Falls Due to  Other  (Comment) History of fall(s)  No Fall Risks  Fall risk Follow up  Falls evaluation completed Education provided Falls evaluation completed Falls evaluation completed   Had one fall - going down steps and tripped, no broken bones or severe injury. Did have a lac that healed well.   SUMMARY AND PLAN:  Encounter for Medicare annual wellness exam  Discussed applicable health maintenance/preventive health measures and advised and referred or ordered per patient preferences: -she sees Dr. Trixie for bone health - reports she sees her next week and will ask about bone density -discussed vaccines due recs/ risks, advised to let us  know if does so that we can update record -discussed all options for pain, risks, etc. She wants to come off tramadol  - advised to do a slow taper if does over a few months. Advised she discuss with PCP if decides to try other options for pain other than otc topical sports creams and ice/heat.  Health Maintenance  Topic Date Due   Influenza Vaccine  04/02/2024   COVID-19 Vaccine (6 - 2025-26 season) 05/03/2024   Medicare Annual Wellness (AWV)  06/10/2025   DTaP/Tdap/Td (3 - Td or Tdap) 03/22/2034   Pneumococcal Vaccine: 50+ Years  Completed   DEXA SCAN  Completed   Zoster Vaccines- Shingrix  Completed   Meningococcal B Vaccine  Aged Out      Education and counseling on the following was provided based on the above review of health and a plan/checklist for the patient, along with additional information discussed, was provided for the patient in the patient instructions :   -Provided counseling and plan for increased risk of falling if applicable per above screening. Reviewed and demonstrated safe balance exercises that can be done at home to improve balance and discussed exercise guidelines for adults with include balance exercises at least 3 days per week.  -Advised and counseled on a healthy lifestyle - including the importance of a healthy diet, regular physical  activity, social connections and stress management. -Reviewed patient's current diet. Advised and counseled on a whole foods based healthy diet. Discussed increasing protein and options and goals. A summary of a healthy diet was provided in the Patient Instructions.  -reviewed patient's current physical activity level and discussed exercise guidelines for adults. Discussed community resources and ideas for safe exercise at home to assist in meeting exercise guideline recommendations in a safe and healthy way.  -Advise yearly dental visits at minimum and regular eye exams -Advised and counseled on opoid use/misuse, risks, alternatives  Follow up: see patient instructions     Patient Instructions  I really enjoyed getting to talk with you today! I am available on Tuesdays and Thursdays for virtual visits if you have any questions or concerns, or if I can be of any further assistance.   CHECKLIST FROM ANNUAL WELLNESS VISIT:  -Follow up (please call to schedule if not scheduled after visit):   -  yearly for annual wellness visit with primary care office  Here is a list of your preventive care/health maintenance measures and the plan for each if any are due:  PLAN For any measures below that may be due:    1. Can get vaccines at the pharmacy. Please provide us  with proof of receipt if you do so that we can update your records accurately.   2. It looks like your bone density test is due and that Dr. Trixie ordered one. Please check with her office. Let us  know if we can help.   Health Maintenance  Topic Date Due   Influenza Vaccine  04/02/2024   COVID-19 Vaccine (6 - 2025-26 season) 05/03/2024   Medicare Annual Wellness (AWV)  05/14/2024   DTaP/Tdap/Td (3 - Td or Tdap) 03/22/2034   Pneumococcal Vaccine: 50+ Years  Completed   DEXA SCAN  Completed   Zoster Vaccines- Shingrix  Completed   Meningococcal B Vaccine  Aged Out    -See a dentist at least yearly  -Get your eyes checked and  then per your eye specialist's recommendations  -Other issues addressed today:   -I have included below further information regarding a healthy whole foods based diet, physical activity guidelines for adults, stress management and opportunities for social connections. I hope you find this information useful.   -----------------------------------------------------------------------------------------------------------------------------------------------------------------------------------------------------------------------------------------------------------    NUTRITION: -eat real food: lots of colorful vegetables (half the plate) and fruits -5-7 servings of vegetables and fruits per day (fresh or steamed is best), exp. 2 servings of vegetables with lunch and dinner and 2 servings of fruit per day. Berries and greens such as kale and collards are great choices.  -consume on a regular basis:  fresh fruits, fresh veggies, fish, nuts, seeds, healthy oils (such as olive oil, avocado oil), whole grains (make sure for bread/pasta/crackers/etc., that the first ingredient on label contains the word whole), legumes. -can eat small amounts of dairy and lean meat (no larger than the palm of your hand), but avoid processed meats such as ham, bacon, lunch meat, etc. -drink water -try to avoid fast food and pre-packaged foods, processed meat, ultra processed foods/beverages (donuts, candy, etc.) -most experts advise limiting sodium to < 2300mg  per day, should limit further is any chronic conditions such as high blood pressure, heart disease, diabetes, etc. The American Heart Association advised that < 1500mg  is is ideal -try to avoid foods/beverages that contain any ingredients with names you do not recognize  -try to avoid foods/beverages  with added sugar or sweeteners/sweets  -try to avoid sweet drinks (including diet drinks): soda, juice, Gatorade, sweet tea, power drinks, diet drinks -try to avoid  white rice, white bread, pasta (unless whole grain)  EXERCISE GUIDELINES FOR ADULTS: -if you wish to increase your physical activity, do so gradually and with the approval of your doctor -STOP and seek medical care immediately if you have any chest pain, chest discomfort or trouble breathing when starting or increasing exercise  -move and stretch your body, legs, feet and arms when sitting for long periods -Physical activity guidelines for optimal health in adults: -get at least 150 minutes per week of moderate exercise (can talk, but not sing); this is about 20-30 minutes of sustained activity 5-7 days per week or two 10-15 minute episodes of sustained activity 5-7 days per week -do some muscle building/resistance training/strength training at least 2 days per week  -balance exercises 3+ days per week:   Stand somewhere where you have something sturdy to hold  onto if you lose balance    1) lift up on toes, then back down, start with 5x per day and work up to 20x   2) stand and lift one leg straight out to the side so that foot is a few inches of the floor, start with 5x each side and work up to 20x each side   3) stand on one foot, start with 5 seconds each side and work up to 20 seconds on each side  If you need ideas or help with getting more active:  -Silver sneakers https://tools.silversneakers.com  -Walk with a Doc: http://www.duncan-williams.com/  -try to include resistance (weight lifting/strength building) and balance exercises twice per week: or the following link for ideas: http://castillo-powell.com/  BuyDucts.dk  STRESS MANAGEMENT: -can try meditating, or just sitting quietly with deep breathing while intentionally relaxing all parts of your body for 5 minutes daily -if you need further help with stress, anxiety or depression please follow up with your primary doctor or contact the wonderful  folks at [facility]: [phone]  SOCIAL CONNECTIONS: -options in [place] if you wish to engage in more social and exercise related activities:  -Silver sneakers https://tools.silversneakers.com  -Walk with a Doc: http://www.duncan-williams.com/  -Check out the [facility] Active Adults 50+ section on the [place] of [employer] (hiking clubs, book clubs, cards and games, chess, exercise classes, aquatic classes and much more) - see the website for details: https://www.[place].gov/departments/parks-recreation/active-adults50  -YouTube has lots of exercise videos for different ages and abilities as well  -[facility] (a variety of indoor and outdoor inperson activities for adults). 619-177-0205. [address].  -Virtual Online Classes (a variety of topics): see seniorplanet.org or call [phone]  -consider volunteering at a school, hospice center, church, senior center or elsewhere            Chiquita JONELLE Cramp, DO

## 2024-06-10 NOTE — Patient Instructions (Signed)
 I really enjoyed getting to talk with you today! I am available on Tuesdays and Thursdays for virtual visits if you have any questions or concerns, or if I can be of any further assistance.   CHECKLIST FROM ANNUAL WELLNESS VISIT:  -Follow up (please call to schedule if not scheduled after visit):   -yearly for annual wellness visit with primary care office  Here is a list of your preventive care/health maintenance measures and the plan for each if any are due:  PLAN For any measures below that may be due:    1. Can get vaccines at the pharmacy. Please provide us  with proof of receipt if you do so that we can update your records accurately.   2. It looks like your bone density test is due and that Dr. Trixie ordered one. Please check with her office. Let us  know if we can help.   Health Maintenance  Topic Date Due   Influenza Vaccine  04/02/2024   COVID-19 Vaccine (6 - 2025-26 season) 05/03/2024   Medicare Annual Wellness (AWV)  05/14/2024   DTaP/Tdap/Td (3 - Td or Tdap) 03/22/2034   Pneumococcal Vaccine: 50+ Years  Completed   DEXA SCAN  Completed   Zoster Vaccines- Shingrix  Completed   Meningococcal B Vaccine  Aged Out    -See a dentist at least yearly  -Get your eyes checked and then per your eye specialist's recommendations  -Other issues addressed today:   -I have included below further information regarding a healthy whole foods based diet, physical activity guidelines for adults, stress management and opportunities for social connections. I hope you find this information useful.   -----------------------------------------------------------------------------------------------------------------------------------------------------------------------------------------------------------------------------------------------------------    NUTRITION: -eat real food: lots of colorful vegetables (half the plate) and fruits -5-7 servings of vegetables and fruits per day (fresh  or steamed is best), exp. 2 servings of vegetables with lunch and dinner and 2 servings of fruit per day. Berries and greens such as kale and collards are great choices.  -consume on a regular basis:  fresh fruits, fresh veggies, fish, nuts, seeds, healthy oils (such as olive oil, avocado oil), whole grains (make sure for bread/pasta/crackers/etc., that the first ingredient on label contains the word whole), legumes. -can eat small amounts of dairy and lean meat (no larger than the palm of your hand), but avoid processed meats such as ham, bacon, lunch meat, etc. -drink water -try to avoid fast food and pre-packaged foods, processed meat, ultra processed foods/beverages (donuts, candy, etc.) -most experts advise limiting sodium to < 2300mg  per day, should limit further is any chronic conditions such as high blood pressure, heart disease, diabetes, etc. The American Heart Association advised that < 1500mg  is is ideal -try to avoid foods/beverages that contain any ingredients with names you do not recognize  -try to avoid foods/beverages  with added sugar or sweeteners/sweets  -try to avoid sweet drinks (including diet drinks): soda, juice, Gatorade, sweet tea, power drinks, diet drinks -try to avoid white rice, white bread, pasta (unless whole grain)  EXERCISE GUIDELINES FOR ADULTS: -if you wish to increase your physical activity, do so gradually and with the approval of your doctor -STOP and seek medical care immediately if you have any chest pain, chest discomfort or trouble breathing when starting or increasing exercise  -move and stretch your body, legs, feet and arms when sitting for long periods -Physical activity guidelines for optimal health in adults: -get at least 150 minutes per week of moderate exercise (can talk, but not  sing); this is about 20-30 minutes of sustained activity 5-7 days per week or two 10-15 minute episodes of sustained activity 5-7 days per week -do some muscle  building/resistance training/strength training at least 2 days per week  -balance exercises 3+ days per week:   Stand somewhere where you have something sturdy to hold onto if you lose balance    1) lift up on toes, then back down, start with 5x per day and work up to 20x   2) stand and lift one leg straight out to the side so that foot is a few inches of the floor, start with 5x each side and work up to 20x each side   3) stand on one foot, start with 5 seconds each side and work up to 20 seconds on each side  If you need ideas or help with getting more active:  -Silver sneakers https://tools.silversneakers.com  -Walk with a Doc: http://www.duncan-williams.com/  -try to include resistance (weight lifting/strength building) and balance exercises twice per week: or the following link for ideas: http://castillo-powell.com/  BuyDucts.dk  STRESS MANAGEMENT: -can try meditating, or just sitting quietly with deep breathing while intentionally relaxing all parts of your body for 5 minutes daily -if you need further help with stress, anxiety or depression please follow up with your primary doctor or contact the wonderful folks at [facility]: [phone]  SOCIAL CONNECTIONS: -options in [place] if you wish to engage in more social and exercise related activities:  -Silver sneakers https://tools.silversneakers.com  -Walk with a Doc: http://www.duncan-williams.com/  -Check out the [employer] Active Adults 50+ section on the [place] [employer] (hiking clubs, book clubs, cards and games, chess, exercise classes, aquatic classes and much more) - see the website for details: https://www.[place].gov/departments/parks-recreation/active-adults50  -YouTube has lots of exercise videos for different ages and abilities as well  -[facility] (a variety of indoor and outdoor  inperson activities for adults). [phone]. [address].  -Virtual Online Classes (a variety of topics): see seniorplanet.org or call [phone]  -consider volunteering at a school, hospice center, church, senior center or elsewhere

## 2024-06-10 NOTE — Progress Notes (Signed)
 Patient unable to obtain vital signs due to telehealth visit

## 2024-06-15 ENCOUNTER — Other Ambulatory Visit: Payer: Self-pay

## 2024-06-15 ENCOUNTER — Ambulatory Visit: Attending: Obstetrics and Gynecology | Admitting: Physical Therapy

## 2024-06-15 DIAGNOSIS — Z85828 Personal history of other malignant neoplasm of skin: Secondary | ICD-10-CM | POA: Diagnosis not present

## 2024-06-15 DIAGNOSIS — R2681 Unsteadiness on feet: Secondary | ICD-10-CM | POA: Insufficient documentation

## 2024-06-15 DIAGNOSIS — M62838 Other muscle spasm: Secondary | ICD-10-CM | POA: Insufficient documentation

## 2024-06-15 DIAGNOSIS — M6281 Muscle weakness (generalized): Secondary | ICD-10-CM | POA: Diagnosis present

## 2024-06-15 DIAGNOSIS — R279 Unspecified lack of coordination: Secondary | ICD-10-CM | POA: Diagnosis not present

## 2024-06-15 DIAGNOSIS — D225 Melanocytic nevi of trunk: Secondary | ICD-10-CM | POA: Diagnosis not present

## 2024-06-15 DIAGNOSIS — R252 Cramp and spasm: Secondary | ICD-10-CM | POA: Insufficient documentation

## 2024-06-15 DIAGNOSIS — N393 Stress incontinence (female) (male): Secondary | ICD-10-CM | POA: Insufficient documentation

## 2024-06-15 DIAGNOSIS — R262 Difficulty in walking, not elsewhere classified: Secondary | ICD-10-CM | POA: Insufficient documentation

## 2024-06-15 DIAGNOSIS — L821 Other seborrheic keratosis: Secondary | ICD-10-CM | POA: Diagnosis not present

## 2024-06-15 DIAGNOSIS — L570 Actinic keratosis: Secondary | ICD-10-CM | POA: Diagnosis not present

## 2024-06-15 DIAGNOSIS — L814 Other melanin hyperpigmentation: Secondary | ICD-10-CM | POA: Diagnosis not present

## 2024-06-15 DIAGNOSIS — R293 Abnormal posture: Secondary | ICD-10-CM | POA: Diagnosis not present

## 2024-06-15 DIAGNOSIS — R269 Unspecified abnormalities of gait and mobility: Secondary | ICD-10-CM | POA: Insufficient documentation

## 2024-06-15 DIAGNOSIS — L918 Other hypertrophic disorders of the skin: Secondary | ICD-10-CM | POA: Diagnosis not present
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
## 2024-06-15 NOTE — Telephone Encounter (Signed)
 [employer]  Patient is ready for scheduling on or after 06/24/24  Out-of-pocket cost due at time of visit: $0  Primary: [employer] [place] [employer]  co-insurance: 0% Admin fee co-insurance: 0%  Deductible: does not apply  Prior Auth: APPROVED PA# [id number] Valid: 11/12/19-09/01/25  Secondary: N/A [employer]  co-insurance:  Admin fee co-insurance:  Deductible:  Prior Auth:  PA# Valid:   ** This summary of benefits is an estimation of the patient's out-of-pocket cost. Exact cost may vary based on individual plan coverage.

## 2024-06-15 NOTE — Patient Instructions (Signed)

## 2024-06-22 ENCOUNTER — Ambulatory Visit: Payer: Medicare PPO | Admitting: Internal Medicine

## 2024-06-24 ENCOUNTER — Encounter: Payer: Self-pay | Admitting: Physical Therapy

## 2024-06-24 ENCOUNTER — Ambulatory Visit: Admitting: Physical Therapy

## 2024-06-24 DIAGNOSIS — R279 Unspecified lack of coordination: Secondary | ICD-10-CM

## 2024-06-24 DIAGNOSIS — M62838 Other muscle spasm: Secondary | ICD-10-CM | POA: Diagnosis not present

## 2024-06-24 DIAGNOSIS — R269 Unspecified abnormalities of gait and mobility: Secondary | ICD-10-CM

## 2024-06-24 DIAGNOSIS — R2681 Unsteadiness on feet: Secondary | ICD-10-CM | POA: Diagnosis not present

## 2024-06-24 DIAGNOSIS — M6281 Muscle weakness (generalized): Secondary | ICD-10-CM | POA: Diagnosis not present

## 2024-06-24 DIAGNOSIS — R293 Abnormal posture: Secondary | ICD-10-CM | POA: Diagnosis not present

## 2024-06-24 DIAGNOSIS — N393 Stress incontinence (female) (male): Secondary | ICD-10-CM | POA: Diagnosis not present

## 2024-06-24 DIAGNOSIS — R252 Cramp and spasm: Secondary | ICD-10-CM | POA: Diagnosis not present

## 2024-06-24 DIAGNOSIS — R262 Difficulty in walking, not elsewhere classified: Secondary | ICD-10-CM | POA: Diagnosis not present
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
## 2024-06-25 ENCOUNTER — Ambulatory Visit

## 2024-06-25 ENCOUNTER — Encounter: Payer: Self-pay | Admitting: Internal Medicine

## 2024-06-25 ENCOUNTER — Ambulatory Visit: Admitting: Internal Medicine

## 2024-06-25 VITALS — BP 124/60 | Ht 61.0 in | Wt 106.2 lb

## 2024-06-25 DIAGNOSIS — M810 Age-related osteoporosis without current pathological fracture: Secondary | ICD-10-CM | POA: Diagnosis not present

## 2024-06-25 DIAGNOSIS — E559 Vitamin D deficiency, unspecified: Secondary | ICD-10-CM | POA: Diagnosis not present

## 2024-06-25 DIAGNOSIS — E871 Hypo-osmolality and hyponatremia: Secondary | ICD-10-CM | POA: Diagnosis not present

## 2024-06-25 LAB — BASIC METABOLIC PANEL WITHOUT GFR
BUN: 23 mg/dL (ref 7–25)
CO2: 28 mmol/L (ref 20–32)
Calcium: 9.7 mg/dL (ref 8.6–10.4)
Chloride: 103 mmol/L (ref 98–110)
Creat: 0.95 mg/dL (ref 0.60–0.95)
Glucose, Bld: 89 mg/dL (ref 65–99)
Potassium: 4.5 mmol/L (ref 3.5–5.3)
Sodium: 139 mmol/L (ref 135–146)

## 2024-06-25 LAB — VITAMIN D 25 HYDROXY (VIT D DEFICIENCY, FRACTURES): Vit D, 25-Hydroxy: 36 ng/mL (ref 30–100)

## 2024-06-25 MED ORDER — DENOSUMAB 60 MG/ML ~~LOC~~ SOSY: 60.0000 mg | PREFILLED_SYRINGE | Freq: Once | SUBCUTANEOUS | Status: AC

## 2024-06-25 NOTE — Progress Notes (Addendum)
 Patient ID: Kaitlyn Flowers, female   DOB: [birth year], 83 y.o.   MRN: [id number]  HPI  Kaitlyn Flowers is a 83 y.o.-year-old female, returning for f/u for of hyponatremia and OP. Last visit 1 year ago.  Interim history: No fractures since last visit but she had a fall 3 mo ago - tumbling down stairs. No fractures.  No dizziness or orthostasis. Her scoliosis continues to progress.  She has significant pain. She is seen in the scoliosis clinic at [facility]. She had a fusion sx at L5-S1 to help with back pain, right leg weakness and foot drop, but this did not help >> was told she have to have 4 vertebrae fused, but wanted a second opinion at [facility] >> no surgery was recommended.  She continues to have R foot drop. She then saw a neurosurgeon in [place] >> may need to have a sacral fusion to prevent the nerves from continuing to degenerate.  She had a cervical neck surgery 11/27/2023 - cervical decompression + fusion. 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  She is trying to avoid another surgery. She is now in PT for bladder control.  Hyponatremia:  Reviewed history:  Patient had orthostatic syncope on 03/03/2016 and went to the emergency room.  After this, she again returned to ED with difficulty speaking, mild confusion and blurry vision on 03/22/2016.  CT head was negative for stroke and this was assumed to be a TIA or neuro changes from UTI.  However, the sodium level was found to be low, at 123.  MRI/MRA on 06/08/2016 did not show a pituitary mass. It showed chronic microvascular ds.  She started to have UTIs in the beginning of 2017 (Pseudomonas)-initially treated with PICC line IV antibiotics, and then p.o. antibiotics.  She had several recurrences.  She had a urinary catheter which she was using 3 times daily as it was suspected that she could not empty her bladder well.  The frequency of her UTIs decreased, then only cathing once a day.  She then had a pessary placed >> not cathing anymore.  She started to drink more water after these episodes to improve her urinary flow, and also as she developed dry mouth.   At the end of 2017, she developed Wieting aphasia, confusion, and then verbal aphasia which lasted 30 minutes.  At that time, she saw her PCP.  The sodium returned slightly low, at 132.  The episodes repeated and she also developed headaches.  She went to the emergency room.  Sodium was lower, at 129.  She subsequently saw Dr. Georjean (neurology).  Of note, she also has severe, chronic, low back pain, with neuropathy left leg.  She tried a spinal cord stimulator but this did not work.  She is seen in the scoliosis clinic at [facility].  Her scoliosis is progressing.  Is and Os: Approximately 1.5 to 2 L a day. (1200 ml water +  240 ml coffee + 240 ml hot water). No headaches or increased thirst.  Reviewed previous sodium levels: Lab Results  Component Value Date   NA 138 11/20/2023   NA 136 08/12/2023   NA 137 06/20/2023   NA 133 (L) 09/13/2022   NA 139 03/22/2022   NA 138 05/03/2021   NA 136 03/30/2020   NA 136 09/29/2019   NA 131 (L) 09/19/2019   NA 127 (L) 09/18/2019   NA 134 (L) 08/16/2019   NA 135  05/13/2019   NA 136 07/22/2018   NA 137 04/29/2018   NA 132 (L) 09/26/2017   Reviewed other pertinent labs from 04/2016: Osmolality     278 - 305 mOsm/kg 271 (L)  Cortisol, Plasma     ug/dL 89.7  R793 ACTH      6 - 50 pg/mL 16  Uric Acid, Serum     2.4 - 7.0 mg/dL 2.7  Osmolality, Urine     50 - 1,200 mOsm/kg 304  Potassium Urine     12 - 129 mmol/L 28  Sodium, Urine     28 - 272 mmol/L 77  Labs pointed towards improving SIADH/water overload without appropriate renal water wasting.    No history of adrenal insufficiency, hypothyroidism, hypertriglyceridemia.  OP: Reviewed history: She was diagnosed with osteoporosis in 2015.  She had a distant right ankle fracture - while rollerskating.  Reviewed her  previous DXA scan reports: 09/26/2020 ([place]) Lumbar spine L1-L4 (L2) Femoral neck (FN)  T-score +0.2 RFN: -2.5 LFN: -2.5  Change in BMD from previous DXA test (%) +13%* +2.7%  (*) statistically significant  01/08/2018 ([place]) Lumbar spine L1-L4 (L2) Femoral neck (FN)  T-score -0.1 RFN: -2.7 LFN: -2.5  Change in BMD from previous DXA test (%) +11.1%* -1.0%  (*) statistically significant  Date L1-L4 T score FN T score  12/10/2013 -1.7 RFN: -2.7 LFN: -2.5   L1-L4 decreased by 5.8% from 2012 Total mean hip BMD decreased 7.7%* from 2012    01/08/2016 Lumbar spine (L1-L4) Femoral neck (FN)  T-score - 1.9  RFN: - 2.5 LFN: - 2.7  Change in BMD from previous DXA test (%) - 1.9%  + 0.4%   (*) statistically significant  Previous treatments: - I suggested Fosamax weekly but she refused in 03/2016 - we started Prolia  09/03/2018.  Subsequent doses: 03/26/2019, 11/30/2019, 09/12/2020, 03/13/2021, 10/10/2021, 06/13/2022, 12/18/2022, 06/20/2023, 12/23/2023 She tolerates this well.  No history of persistent hyper or hypercalcemia, no history of kidney stones. Lab Results  Component Value Date   PTH 59 02/13/2016   PTH 66 (H) 07/11/2014   CALCIUM 9.1 11/20/2023   CALCIUM 10.0 08/12/2023   CALCIUM 10.5 (H) 06/20/2023   CALCIUM 9.5 09/13/2022   CALCIUM 9.4 03/22/2022   CALCIUM 9.5 05/03/2021   CALCIUM 8.9 03/30/2020   CALCIUM 9.4 09/29/2019   CALCIUM 7.3 (L) 09/19/2019   CALCIUM 7.6 (L) 09/18/2019   No history of thyrotoxicosis: Lab Results  Component Value Date   TSH 1.42 03/22/2022   TSH 0.83 09/26/2017   TSH 1.28 02/13/2016   TSH 1.21 07/11/2014   She does have a history of vitamin D  deficiency.  At last visit she was taking approximately 3000 to 5000 units of vitamin D  daily.  I advised her to increase the dose to 5000 units daily.  Vitamin D  levels: Lab Results  Component Value Date   VD25OH 76 06/20/2023   VD25OH 26.6 (L) 05/03/2021   VD25OH 46.63 04/29/2018   VD25OH 78.21  12/23/2016   VD25OH 64.30 07/22/2016   VD25OH 55.43 07/09/2016   VD25OH 94.31 04/02/2016   VD25OH 60.65 02/10/2014   She was on calcium citrate >> now off.  No CKD. Last BUN/Cr: Lab Results  Component Value Date   BUN 20 11/20/2023   CREATININE 0.82 11/20/2023   She does Pilates for exercise (including weight bearing exercises). Also in PT now for weakness in R leg.   She has a family history of osteoporosis in mother/sisters.  Mother had kyphosis.  Menopause  was at 85 years old surgical TAH + BSO.  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 She has degenerative scoliosis, seen at [facility]. She has a history of frequent UTIs, on nitrofurantoin .  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   Urinary tract infection    Wears hearing aid    Past Surgical History:  Procedure Laterality Date   ABDOMINAL EXPOSURE N/A 05/17/2019   Procedure: ABDOMINAL EXPOSURE;  Surgeon: Eliza Lonni RAMAN, MD;  Location: [facility] OR;  Service: Vascular;  Laterality: N/A;   ABDOMINAL HYSTERECTOMY  1973   1/2 of ovary left cyst on ovary   ANTERIOR CERVICAL DECOMP/DISCECTOMY FUSION N/A 11/27/2023   Procedure: ANTERIOR CERVICAL DECOMPRESSION/DISCECTOMY FUSION 3 LEVELS;  Surgeon: Colon Shove, MD;  Location: [facility] OR;  Service: Neurosurgery;  Laterality: N/A;  Anterior Cervical Discectomy Fusion - Cervical Four-Cervical Five -  Cervical Five-Cervical Six - Cervical Six-Cervical Seven   ANTERIOR LUMBAR FUSION N/A 05/17/2019   Procedure: Lumbar five Sacral one  Anterior lumbar interbody fusion;  Surgeon: Colon Shove, MD;  Location: [facility] OR;  Service: Neurosurgery;  Laterality: N/A;   APPENDECTOMY  1973   removed with hysterectomy   COLONOSCOPY     LUMBAR WOUND DEBRIDEMENT  12/06/2011   Procedure: LUMBAR WOUND DEBRIDEMENT;  Surgeon: Shove Colon, MD;  Location: [facility] OR[facility];  Service: Neurosurgery;  Laterality: N/A;  Repair of Pseudomeningocele Lumbar wound   shoulder rotator culff  Left 2006   SPINAL CORD STIMULATOR INSERTION  2019   at [facility] and removed 48 hours later due to extreme pain   spinal cord tumor  06/2011   TONSILLECTOMY     As a child   History   Social History   Marital Status: married    Spouse Name: N/A    Number of Children: 2   Occupational History   research scientist (medical)   Social History Main Topics   Smoking status: Former Smoker -- 0.25 packs/day for 2 years    Types: Cigarettes    Quit date: 09/03/1967   Smokeless tobacco: Not on file   Alcohol Use: 2.4 oz/week    4-5 Glasses of wine per week   Drug Use: No   Social History Narrative   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  NON FORMULARY Pt uses a cpap nightly     pyridoxine (B-6) 200 MG tablet Take 200 mg by mouth daily.     traMADol  (ULTRAM ) 50 MG tablet Take 1 tablet (50 mg total) by mouth 2 (two) times daily as needed. 60 tablet 0   Current Facility-Administered Medications on File Prior to Visit  Medication Dose Route Frequency  Provider Last Rate Last Admin   denosumab  (PROLIA ) injection 60 mg  60 mg Subcutaneous Once Marlie Kuennen, MD       Allergies  Allergen Reactions   Ibuprofen -Famotidine  Other (See Comments)    Elevated BP   Pregabalin  Swelling    AMS, Swelling   Penicillins Rash    Did it involve swelling of the face/tongue/throat, SOB, or low BP? No Did it involve sudden or severe rash/hives, skin peeling, or any reaction on the inside of your mouth or nose? No Did you need to seek medical attention at a hospital or doctor's office? No When did it last happen?      30+ years If all above answers are "NO", may proceed with cephalosporin use.    Family History  Problem Relation Age of Onset   Stroke Mother        2010   Heart attack Father    Cancer Paternal Grandmother    Uterine cancer Neg Hx    Bladder Cancer Neg Hx    PE: BP 124/60   Ht 5' 1 (1.549 m)   Wt 106 lb 3.2 oz (48.2 kg)   LMP  (LMP Unknown)   BMI 20.07 kg/m  Wt Readings from Last 3 Encounters:  06/25/24 106 lb 3.2 oz (48.2 kg)  06/07/24 105 lb (47.6 kg)  05/06/24 104 lb (47.2 kg)   Constitutional: normal weight, in NAD.  She walks with a cane.  Antalgic gait. Eyes: EOMI, no exophthalmos ENT: no thyromegaly, no cervical lymphadenopathy Cardiovascular: RRR, No MRG Respiratory: CTA B Musculoskeletal: + deformities: Significant scoliosis Skin: no rashes Neurological: + tremor with outstretched hands - L>R  Assessment: 1.  Hyponatremia  2.  Osteoporosis  Plan: 1. Hyponatremia - She has hyponatremia since 03/2016.  She initially presented with orthostatic syncope and difficulty speaking, mild confusion, blurry vision, in the setting of a UTI.  Head CT and MRI were normal and her symptoms resolved.  Sodium was found to be 123.  Subsequent sodium levels have been improving and then normalized.  Her latest sodium level was slightly low, at 133 on 09/13/2022.  This is the first low value that she had since 2021.   - Prior  investigation pointing towards either SIADH or increased water intake overcoming the renal mechanism of urine dilution I suspect the latter). - Of note, she has a history of seizures and is on Lamictal , but no recent episodes -Will continue to manage her with fluid restriction (intended at max 1.5L per day, but realistically getting approximately 1.5 to 2 L a day) - We did discuss about salt tablet use in the past, but she does have labile blood pressure and I did not feel that this was a good option for her - Latest sodium level was normal, at 138 on 11/20/2023.  We will repeat this today.  2. Osteoporosis - She had a fall but no fractures since last visit.  - She continues on Prolia  without side effects.  No jaw/thigh/hip pain - Latest injection was on 12/23/2023.  She is due for another injection now.  She is usually doing a good  job keeping no more than approximately 6 months between injections. -We did previously discussed that due to her history of meningioma resected from her thoracic spine and followed by skeletal radiotherapy, she is not a candidate for teriparatide or abaloparatide since there is an increased risk of osteosarcoma.  This would have been ideal for her since she also possibly needed spine surgery, however, she mentioned that it was not felt that she was a good candidate for scoliosis surgery only for a limited intervention, on the sacrum per her neurosurgeon in [place].  Since last visit, she had decompression and fusion surgery in her cervical spine (11/2023), which she feels helped. - Reviewing previous bone density reports: In 12/2017, her femoral scores were still in the osteoporosis range but fairly stable from 2007 T-score was slightly higher.  However, L2 vertebra was excluded from analysis due to degenerative changes in 2019, compared to the scan from 2017, in which all vertebrae were analyzed.  Another bone density scan in 09/2020 showed that the lumbar bone density was  increased significantly, while the femoral neck bone densities were stable.  At last visit, I ordered another bone density scan but she did not have this yet.  Will order this again today. - She continues to exercise: Yoga, Pilates, walking.  She does weightbearing exercises, also. - We can discussed about getting 1200 mg of calcium daily in her diet.  She good diet including almond milk, greens, legumes, and nuts.  I did advise her that if she did not feel that her calcium intake per day is sufficient, to supplement with calcium tablets. - She continues on 5000 units vitamin D  daily.  Latest vitamin D  level was normal at last visit. -I will see her back in 1 year  + Prolia  shot today  Orders Placed This Encounter  Procedures   DG Bone Density   Basic Metabolic Panel Without GFR   VITAMIN D  25 Hydroxy (Vit-D Deficiency, Fractures)   Component     Latest Ref Rng 06/25/2024  Sodium     135 - 146 mmol/L 139   Potassium     3.5 - 5.3 mmol/L 4.5   Chloride     98 - 110 mmol/L 103   CO2     20 - 32 mmol/L 28   Glucose     65 - 99 mg/dL 89   BUN     7 - 25 mg/dL 23   Creatinine     9.39 - 0.95 mg/dL 9.04   Calcium     8.6 - 10.4 mg/dL 9.7   Vitamin D , 25-Hydroxy     30 - 100 ng/mL 36   BUN/Creatinine Ratio     6 - 22 (calc) SEE NOTE:   Normal.    Lumbar spine L1-L4 (L2, L3) Femoral neck (FN) 33% distal radius Ultra distal radius  T-score +0.9 RFN: -2.5 LFN: -2.5 -4.8 -3.9  Change in BMD from previous DXA test (%) +14.5%* 0.0 n/a n/a  (*) statistically significant  T-scores are stable at the level of the femoral necks, and  increased at the level of the spine. However, at this site, 2 of the vertebrae could not be analyzed as opposed to only one of the vertebrae on the previous measurements.  T-scores are very low at the ultra distal radius and 33% distal radius.  We can continue with Prolia  for now.    Kaitlyn Fendt, MD PhD [facility]

## 2024-06-25 NOTE — Patient Instructions (Signed)
Please call and schedule bone density scan at the [facility] Office: [phone].   Please make sure you get ~1200 mg calcium from the diet preferentially from the diet.  Please stop at the lab.  You should have an endocrinology follow-up appointment in 1 year.

## 2024-06-28 ENCOUNTER — Ambulatory Visit: Payer: Self-pay | Admitting: Internal Medicine

## 2024-06-29 NOTE — Telephone Encounter (Signed)
 Last Prolia  inj 06/25/24 Next Prolia  inj due 12/25/24

## 2024-07-01 ENCOUNTER — Ambulatory Visit

## 2024-07-01 ENCOUNTER — Other Ambulatory Visit

## 2024-07-01 DIAGNOSIS — R279 Unspecified lack of coordination: Secondary | ICD-10-CM | POA: Diagnosis not present

## 2024-07-01 DIAGNOSIS — R269 Unspecified abnormalities of gait and mobility: Secondary | ICD-10-CM

## 2024-07-01 DIAGNOSIS — R262 Difficulty in walking, not elsewhere classified: Secondary | ICD-10-CM | POA: Diagnosis not present

## 2024-07-01 DIAGNOSIS — M6281 Muscle weakness (generalized): Secondary | ICD-10-CM

## 2024-07-01 DIAGNOSIS — M62838 Other muscle spasm: Secondary | ICD-10-CM

## 2024-07-01 DIAGNOSIS — R252 Cramp and spasm: Secondary | ICD-10-CM | POA: Diagnosis not present

## 2024-07-01 DIAGNOSIS — N393 Stress incontinence (female) (male): Secondary | ICD-10-CM | POA: Diagnosis not present

## 2024-07-01 DIAGNOSIS — R2681 Unsteadiness on feet: Secondary | ICD-10-CM

## 2024-07-01 DIAGNOSIS — R293 Abnormal posture: Secondary | ICD-10-CM | POA: Diagnosis not present
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
## 2024-07-06 ENCOUNTER — Ambulatory Visit

## 2024-07-07 ENCOUNTER — Ambulatory Visit: Admitting: Physical Therapy

## 2024-07-07 ENCOUNTER — Ambulatory Visit
Admission: RE | Admit: 2024-07-07 | Discharge: 2024-07-07 | Disposition: A | Source: Ambulatory Visit | Attending: Internal Medicine | Admitting: Internal Medicine

## 2024-07-07 DIAGNOSIS — M810 Age-related osteoporosis without current pathological fracture: Secondary | ICD-10-CM | POA: Diagnosis not present

## 2024-07-15 ENCOUNTER — Ambulatory Visit: Attending: Obstetrics and Gynecology

## 2024-07-15 DIAGNOSIS — R293 Abnormal posture: Secondary | ICD-10-CM | POA: Diagnosis not present

## 2024-07-15 DIAGNOSIS — M6281 Muscle weakness (generalized): Secondary | ICD-10-CM | POA: Insufficient documentation

## 2024-07-15 DIAGNOSIS — R262 Difficulty in walking, not elsewhere classified: Secondary | ICD-10-CM | POA: Insufficient documentation

## 2024-07-15 DIAGNOSIS — R269 Unspecified abnormalities of gait and mobility: Secondary | ICD-10-CM | POA: Insufficient documentation

## 2024-07-15 DIAGNOSIS — R252 Cramp and spasm: Secondary | ICD-10-CM | POA: Diagnosis not present

## 2024-07-15 DIAGNOSIS — R2681 Unsteadiness on feet: Secondary | ICD-10-CM | POA: Diagnosis not present

## 2024-07-15 DIAGNOSIS — M62838 Other muscle spasm: Secondary | ICD-10-CM | POA: Insufficient documentation

## 2024-07-15 DIAGNOSIS — R279 Unspecified lack of coordination: Secondary | ICD-10-CM | POA: Diagnosis not present
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
## 2024-07-19 ENCOUNTER — Ambulatory Visit: Admitting: Obstetrics and Gynecology

## 2024-07-19 ENCOUNTER — Encounter: Payer: Self-pay | Admitting: Obstetrics and Gynecology

## 2024-07-19 VITALS — BP 136/73 | HR 78

## 2024-07-19 DIAGNOSIS — Z960 Presence of urogenital implants: Secondary | ICD-10-CM

## 2024-07-19 DIAGNOSIS — N952 Postmenopausal atrophic vaginitis: Secondary | ICD-10-CM

## 2024-07-19 DIAGNOSIS — N393 Stress incontinence (female) (male): Secondary | ICD-10-CM

## 2024-07-19 DIAGNOSIS — N8110 Cystocele, unspecified: Secondary | ICD-10-CM

## 2024-07-19 DIAGNOSIS — N812 Incomplete uterovaginal prolapse: Secondary | ICD-10-CM | POA: Diagnosis not present

## 2024-07-19 NOTE — Progress Notes (Signed)
 [facility]   Subjective:     Chief Complaint:  Chief Complaint  Patient presents with   Pessary Check    Kaitlyn Flowers is a 83 y.o. female is here for    History of Present Illness: Kaitlyn Flowers is a 83 y.o. female with stage II pelvic organ prolapse and stress incontinence who presents for a pessary check. She is using a size #2 incontinence dish pessary. The pessary has been working well and she has no complaints. She is using vaginal estrogen. She denies vaginal bleeding.  Patient is still having back pain and is working with PT.   Past Medical History: Patient  has a past medical history of Anemia, Arthritis, Back pain, Cancer (HCC), Leg weakness, Neuromuscular disorder (HCC), Osteopenia, PONV (postoperative nausea and vomiting), Right leg pain, Rotator cuff tear, Seizures (HCC), Sleep apnea, Spinal cord tumor, Urinary tract infection, and Wears hearing aid.   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   Medications: She has a current medication list which includes the following prescription(s): estradiol , lamotrigine , magnesium, NON FORMULARY, pyridoxine, and tramadol , and the following Facility-Administered Medications: [START ON 12/24/2024] denosumab .   Allergies: Patient is allergic to ibuprofen -famotidine , pregabalin , and penicillins.   Social History: Patient  reports that she quit smoking about 56 years ago. Her smoking use included cigarettes. She started smoking about 58 years ago. She has a 0.5 pack-year smoking history. She has never used smokeless tobacco.  She reports that she does not currently use alcohol after a past usage of about 2.0 standard drinks of alcohol per week. She reports that she does not use drugs.      Objective:    Physical Exam: BP 136/73   Pulse 78   LMP  (LMP Unknown)  Gen: No apparent distress, A&O x 3. Detailed Urogynecologic Evaluation:  Pelvic Exam: Normal external female genitalia; Bartholin's and Skene's glands normal in appearance; urethral meatus with caruncle, no urethral masses but there is some slight green vaginal discharge. Aptima swab obtained.  The pessary was noted to be in place. It was removed and cleaned. Speculum exam revealed no lesions in the vagina. The pessary was replaced. It was comfortable to the patient and fit well.    Assessment/Plan:    Assessment: Ms. Stamp is a 83 y.o. with stage II pelvic organ prolapse and stress incontinence here for a pessary check. She is doing well.  Plan: She will keep the pessary in place until next visit. She will continue to use estrogen. She will follow-up in 3 months for a pessary check or sooner as needed.   Patient would like more support for her SUI. We discussed again the option of Urethral bulking in office. She reports she would like to try that as she is not in the mental headspace to think she would like to pursue surgical intervention.   All questions were answered.

## 2024-07-20 ENCOUNTER — Encounter: Payer: Self-pay | Admitting: Neurology

## 2024-07-22 ENCOUNTER — Ambulatory Visit

## 2024-07-27 ENCOUNTER — Ambulatory Visit: Admitting: Physical Therapy

## 2024-07-27 DIAGNOSIS — R2681 Unsteadiness on feet: Secondary | ICD-10-CM | POA: Diagnosis not present

## 2024-07-27 DIAGNOSIS — M6281 Muscle weakness (generalized): Secondary | ICD-10-CM | POA: Diagnosis not present

## 2024-07-27 DIAGNOSIS — M62838 Other muscle spasm: Secondary | ICD-10-CM | POA: Diagnosis not present

## 2024-07-27 DIAGNOSIS — R279 Unspecified lack of coordination: Secondary | ICD-10-CM | POA: Diagnosis not present

## 2024-07-27 DIAGNOSIS — R262 Difficulty in walking, not elsewhere classified: Secondary | ICD-10-CM | POA: Diagnosis not present

## 2024-07-27 DIAGNOSIS — R252 Cramp and spasm: Secondary | ICD-10-CM | POA: Diagnosis not present

## 2024-07-27 DIAGNOSIS — R269 Unspecified abnormalities of gait and mobility: Secondary | ICD-10-CM | POA: Diagnosis not present

## 2024-07-27 DIAGNOSIS — R293 Abnormal posture: Secondary | ICD-10-CM | POA: Diagnosis not present

## 2024-07-27 NOTE — Patient Instructions (Signed)
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
## 2024-07-28 ENCOUNTER — Encounter (HOSPITAL_BASED_OUTPATIENT_CLINIC_OR_DEPARTMENT_OTHER): Payer: Self-pay

## 2024-07-28 ENCOUNTER — Encounter (HOSPITAL_BASED_OUTPATIENT_CLINIC_OR_DEPARTMENT_OTHER): Admitting: Physical Therapy

## 2024-08-03 ENCOUNTER — Ambulatory Visit: Admitting: Physical Therapy

## 2024-08-05 ENCOUNTER — Ambulatory Visit: Admitting: Physical Therapy

## 2024-08-05 ENCOUNTER — Telehealth: Payer: Self-pay

## 2024-08-05 NOTE — Telephone Encounter (Signed)
 Ok with me

## 2024-08-05 NOTE — Telephone Encounter (Signed)
 Copied from CRM #[id number]. Topic: Appointments - Transfer of Care >> Aug 05, 2024 11:23 AM Ismael A wrote: Pt is requesting to transfer FROM: Dr. Neysa Pt is requesting to transfer TO: Dr. Neda Reason for requested transfer: Dr. Neysa Retiring It is the responsibility of the team the patient would like to transfer to (Dr. Neda) to reach out to the patient if for any reason this transfer is not acceptable.   Dr Neda, is this okay?

## 2024-08-06 ENCOUNTER — Ambulatory Visit: Admitting: Physical Therapy

## 2024-08-09 ENCOUNTER — Telehealth: Payer: Self-pay

## 2024-08-09 NOTE — Telephone Encounter (Signed)
 ATC 1x sent LTR left VM. PT booked a Mychart APT that was not correct.

## 2024-08-10 ENCOUNTER — Ambulatory Visit: Admitting: Physical Therapy

## 2024-08-12 ENCOUNTER — Ambulatory Visit: Admitting: Physical Therapy

## 2024-08-12 ENCOUNTER — Encounter (HOSPITAL_BASED_OUTPATIENT_CLINIC_OR_DEPARTMENT_OTHER): Payer: Self-pay | Admitting: Physical Therapy

## 2024-08-12 ENCOUNTER — Ambulatory Visit (HOSPITAL_BASED_OUTPATIENT_CLINIC_OR_DEPARTMENT_OTHER): Admitting: Physical Therapy

## 2024-08-12 DIAGNOSIS — R293 Abnormal posture: Secondary | ICD-10-CM

## 2024-08-12 DIAGNOSIS — R279 Unspecified lack of coordination: Secondary | ICD-10-CM | POA: Insufficient documentation

## 2024-08-12 DIAGNOSIS — M62838 Other muscle spasm: Secondary | ICD-10-CM | POA: Insufficient documentation

## 2024-08-12 DIAGNOSIS — R269 Unspecified abnormalities of gait and mobility: Secondary | ICD-10-CM | POA: Diagnosis present
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
## 2024-08-12 NOTE — Therapy (Signed)
 OUTPATIENT OCCUPATIONAL THERAPY ORTHO EVALUATION  Patient Name: Kaitlyn Flowers MRN: [id number] DOB:01/27/1941, 83 y.o., female Today's Date: 08/16/2024  PCP: Charlett MOHR MD REFERRING PROVIDER:  Colon Shove, MD    END OF SESSION:  OT End of Session - 08/16/24 1058     Visit Number 1    Date for Recertification  10/01/24    Authorization Type [employer]    OT Start Time 1100    OT Stop Time 1144    OT Time Calculation (min) 44 min    Activity Tolerance Patient tolerated treatment well;Patient limited by pain    Behavior During Therapy [facility] for tasks assessed/performed          Past Medical History:  Diagnosis Date   Anemia    Arthritis    Back pain    Cancer (HCC)    skin nose   Leg weakness    Neuromuscular disorder (HCC)    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   Urinary tract infection    Wears hearing aid    Past Surgical History:  Procedure Laterality Date   ABDOMINAL EXPOSURE N/A 05/17/2019   Procedure: ABDOMINAL EXPOSURE;  Surgeon: Eliza Lonni RAMAN, MD;  Location: [facility] OR;  Service: Vascular;  Laterality: N/A;   ABDOMINAL HYSTERECTOMY  1973   1/2 of ovary left cyst on ovary   ANTERIOR CERVICAL DECOMP/DISCECTOMY FUSION N/A 11/27/2023   Procedure: ANTERIOR CERVICAL DECOMPRESSION/DISCECTOMY FUSION 3 LEVELS;  Surgeon: Colon Shove, MD;  Location: [facility] OR;  Service: Neurosurgery;  Laterality: N/A;  Anterior Cervical Discectomy Fusion - Cervical Four-Cervical Five - Cervical Five-Cervical Six - Cervical Six-Cervical Seven   ANTERIOR LUMBAR FUSION N/A 05/17/2019   Procedure: Lumbar five Sacral one  Anterior lumbar  interbody fusion;  Surgeon: Colon Shove, MD;  Location: [facility] OR;  Service: Neurosurgery;  Laterality: N/A;   APPENDECTOMY  1973   removed with hysterectomy   COLONOSCOPY     LUMBAR WOUND DEBRIDEMENT  12/06/2011   Procedure: LUMBAR WOUND DEBRIDEMENT;  Surgeon: Shove Colon, MD;  Location: [facility] NEURO ORS;  Service: Neurosurgery;  Laterality: N/A;  Repair of Pseudomeningocele Lumbar wound   shoulder rotator culff  Left 2006   SPINAL CORD STIMULATOR INSERTION  2019   at [facility] and removed 48 hours later due to extreme pain   spinal cord tumor  06/2011   TONSILLECTOMY     As a child   Patient Active Problem List   Diagnosis Date Noted   Cervical myelopathy (HCC) 11/27/2023   OSA (obstructive sleep apnea) 04/21/2023   Dyspnea on exertion 04/21/2023   Foot drop, right foot 02/08/2021   Midline cystocele 12/31/2019   Spondylolisthesis at L5-S1 level 05/17/2019   Tension-type headache, not intractable 06/11/2017   Right leg weakness 06/11/2017   Labile hypertension 09/27/2016   Autonomic dysfunction 09/27/2016   Localization-related idiopathic epilepsy and epileptic syndromes with seizures of localized onset, not intractable, without status epilepticus (HCC) 08/01/2016   Hyponatremia 04/01/2016   Nocturnal leg cramps 12/07/2015   Spinal stenosis, lumbar region, 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   Osteoporosis  12/26/2013   Glucosuria 12/16/2012   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/11/2011   Fatigue 11/11/2011   Medicare annual wellness visit, initial 12/18/2010   Wears hearing aid 12/18/2010   Back pain 11/23/2010   ABDOMINAL BRUIT 07/21/2009    ONSET DATE: March 2025  REFERRING DIAG: G56.01 (ICD-10-CM) - Carpal tunnel syndrome, right upper limb   THERAPY DIAG:  Pain in right hand - Plan: Ot plan of care cert/re-cert  Paresthesia of skin - Plan: Ot plan of care cert/re-cert  Stiffness of right wrist, not elsewhere classified - Plan: Ot plan of care  cert/re-cert  Rationale for Evaluation and Treatment: Rehabilitation  SUBJECTIVE:   SUBJECTIVE STATEMENT: She states she had a cervical fusion in March to help with nerve pain running down her right arm.  She states her pain got better, but eventually the right hand started hurting. She denies numbness but feels sharp, needle-like pain. It gets worse with use, but is not effected at night.  She states it's digits 4, 5 that hurt,   NOT 1-3.    PERTINENT HISTORY: Cervical fusion, unstable gait and balance history, past cancer history and others  PRECAUTIONS: Fall (use close proximity, continue recommend PT and mobility devices)  RED FLAGS: None   WEIGHT BEARING RESTRICTIONS: No  PAIN:  Are you having pain? Yes: NPRS scale: 5-6/10 now at rest (11AM) Pain location: Right hand digits 4 and 5 Pain description: Pins-and-needles sharp Aggravating factors: Pressing through palm Relieving factors: Rest  FALLS: Has patient fallen in last 6 months? Yes. Number of falls 1-considered mild risk of falls, does typically use a device and CPT  LIVING ENVIRONMENT: Lives with: lives alone Lives in: House/apartment Has following equipment at home: [employer] - 2 wheeled  PLOF: Independent with household mobility with device and Independent with community mobility with device  PATIENT GOALS: To improve pain in the right hand  NEXT MD VISIT: As needed   OBJECTIVE: (All objective assessments below are from initial evaluation on: 08/16/24 unless otherwise specified.)   HAND DOMINANCE: Right   ADLs: Overall ADLs: States decreased ability to grab, hold household objects, pain and difficulty to open containers, perform FMS tasks (manipulate fasteners on clothing), etc   FUNCTIONAL OUTCOME MEASURES: Eval: Patient Specific Functional Scale: 2 (lifting with Rt hand, open jars, using walker)  (Higher Score  =  Better Ability for the Selected Tasks)      UPPER EXTREMITY ROM      Shoulder to Wrist AROM Right eval  Shoulder flexion   Shoulder abduction   Shoulder extension   Shoulder internal rotation   Shoulder external rotation   Elbow flexion   Elbow extension   Forearm supination 85  Forearm pronation  85  Wrist flexion 63  Wrist extension 65  Wrist ulnar deviation   Wrist radial deviation   Functional dart thrower's motion (F-DTM) in ulnar flexion   F-DTM in radial extension    (Blank rows = not tested)   Hand AROM Right eval  Full Fist Ability (or Gap to Distal Palmar Crease) full  Thumb Opposition  (Kapandji Scale)  WFL  Thumb MCP (0-60)   Thumb IP (0-80)   Thumb Radial Abduction Span   Thumb Palmar Abduction Span   Index MCP (0-90)   Index PIP (0-100)   Index DIP (0-70)    Long MCP (0-90)    Long PIP (0-100)    Long DIP (0-70)    Ring MCP (0-90)  Ring PIP (0-100)    Ring DIP (0-70)    Little MCP (0-90)    Little PIP (0-100)    Little DIP (0-70)    (Blank rows = not tested)   HAND FUNCTION: Eval: Observed weakness in affected Rt hand.  She also showed signs of intrinsic muscle weakness, ulnar nerve innervated. Grip strength Right: 26 lbs, Left: 30 lbs   COORDINATION: Eval: No significant coordination complaints are noted impairments today in right hand.  SENSATION: Eval:  Light touch intact today,  though somewhat diminished near Guyon's canal and paresthesias reported dorsally and volarly in the last 2 fingers of the right hand  EDEMA:   Eval: None  COGNITION: Eval: Overall cognitive status: WFL for evaluation today   OBSERVATIONS:   Eval:  seems to have some tightness through Rt ECU and also irritable ulnar nerve at elbow and Guyon's canal with Tinel's Testing.  Tinel's test was negative for radial nerve.   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  She does have some apparent muscle wasting to the hyperthenar eminence.  Finger abduction and adduction were both somewhat weak in the right hand compared  to the left   TODAY'S TREATMENT:  Post-evaluation treatment:   OT provides education for safety/self-care including avoiding pressure on the Ganz canal, avoiding pressure on the elbow, keeping elbows straighter than 90 degrees most of the time if able.  OT does educate on nerve paresthesia and the causes of impingement.  She was recommended to get a pair of padded gloves to help her hold her walker without causing nerve impingement.  OT also gives out the following home exercise program to perform 3-4 times a day as tolerated to help improve tightness and nerve paresthesia.  Each 1 was reviewed with her and she states understanding before the session is out   Exercises - Ulnar Nerve Flossing  - 3-4 x daily - 1-2 sets - 5-10 reps - Ulnar Nerve Butterfly  - 4-6 x daily - 1 sets - 10-15 reps - Wrist Prayer Stretch  - 4 x daily - 3-5 reps - 15 sec hold - Wrist Flexion Stretch  - 4 x daily - 3-5 reps - 15 sec hold - HOOK Stretch  - 4 x daily - 3-5 reps - 15-20 sec hold - Tendon Glides  - 4-6 x daily - 3-5 reps - 2-3 seconds hold   PATIENT EDUCATION: Education details: See tx section above for details  Person educated: Patient Education method: Engineer, Structural, Teach back, Handouts  Education comprehension: States and demonstrates understanding, Additional Education required    HOME EXERCISE PROGRAM: Access Code: [facility] URL: [email or web address] Date: 08/16/2024 Prepared by: Melvenia Ada   GOALS: Goals reviewed with patient? Yes   SHORT TERM GOALS: (STG required if POC>30 days) Target Date: 08/16/24  Pt will demo/state understanding of initial HEP to improve pain levels and prerequisite motion. Goal status: MET   LONG TERM GOALS: Target Date: 10/01/24  Pt will improve functional ability by decreased impairment per PSFS assessment from 2 to 6 or better, for better quality of life. Goal status: INITIAL  2.  Pt will improve grip strength in Rt hand from  26lbs to at least 30lbs for functional use at home and in IADLs. Goal status: INITIAL  3.  Pt will improve A/ROM in Rt wrist flexion from 63 degrees to at least 66 degrees, to have functional motion for tasks like reach and grasp.  Goal status: INITIAL  4.  Pt  will decrease pain in right hand at rest from 5-6/10 to 2/10 or better to have better sleep and occupational participation in daily roles. Goal status: INITIAL   ASSESSMENT:  CLINICAL IMPRESSION: Patient is a 83 y.o. female who was seen today for occupational therapy evaluation for paresthesia, pain, stiffness through the right hand and ulnar nerve distribution and small 2 fingers.  She states that this causes pain, decreased ability to perform daily activities and affects balance and safety with walker use.  The patient will benefit from outpatient occupational therapy to decrease symptoms, improve functional upper extremity use, and increase quality of life.  PERFORMANCE DEFICITS: in functional skills including ADLs, IADLs, sensation, ROM, pain, body mechanics, decreased knowledge of precautions, and UE functional use, cognitive skills including problem solving and safety awareness, and psychosocial skills including coping strategies, environmental adaptation, habits, and routines and behaviors.   IMPAIRMENTS: are limiting patient from ADLs, IADLs, rest and sleep, and leisure.   COMORBIDITIES: has co-morbidities such as CA hx, cervical fusion, unstable gait, etc. that affects occupational performance. Patient will benefit from skilled OT to address above impairments and improve overall function.  MODIFICATION OR ASSISTANCE TO COMPLETE EVALUATION: No modification of tasks or assist necessary to complete an evaluation.  OT OCCUPATIONAL PROFILE AND HISTORY: Detailed assessment: Review of records and additional review of physical, cognitive, psychosocial history related to current functional performance.  CLINICAL DECISION MAKING:  Moderate - several treatment options, min-mod task modification necessary  REHAB POTENTIAL: Excellent  EVALUATION COMPLEXITY: Low      PLAN:  OT FREQUENCY: 1-2x/week  OT DURATION: 6 weeks through 10/02/23 and up to 5 total visits as needed   PLANNED INTERVENTIONS: 97535 self care/ADL training, 02889 therapeutic exercise, 97530 therapeutic activity, 97112 neuromuscular re-education, 97140 manual therapy, 97035 ultrasound, 97032 electrical stimulation (manual), 97760 Orthotic Initial, [id number] Orthotic/Prosthetic subsequent, compression bandaging, Dry needling, energy conservation, coping strategies training, and patient/family education  RECOMMENDED OTHER SERVICES: none now, perhaps PT for continued gait/balance/foot drop issues  CONSULTED AND AGREED WITH PLAN OF CARE: Patient  PLAN FOR NEXT SESSION:   Review initial HEP and recommendations as needed and helpful over the next 6 weeks.  The holidays are here and it might be difficult for her to get in but she was recommended to come in if she can/needs to   Melvenia Ada, OTR/L, CHT  08/16/2024, 1:55 PM    Referring diagnosis? G56.01 (ICD-10-CM) - Carpal tunnel syndrome, right upper limb  Treatment diagnosis? (if different than referring diagnosis) M79.641  What was this (referring dx) caused by? []  Surgery []  Fall [x]  Ongoing issue []  Arthritis []  Other: ____________  Laterality: [x]  Rt []  Lt []  Both  Check all possible CPT codes:  *CHOOSE 10 OR LESS*    See Planned Interventions listed in the Plan section of the Evaluation.

## 2024-08-13 ENCOUNTER — Ambulatory Visit: Admitting: Pulmonary Disease

## 2024-08-16 ENCOUNTER — Encounter: Payer: Self-pay | Admitting: Rehabilitative and Restorative Service Providers"

## 2024-08-16 ENCOUNTER — Ambulatory Visit: Admitting: Rehabilitative and Restorative Service Providers"

## 2024-08-16 DIAGNOSIS — R202 Paresthesia of skin: Secondary | ICD-10-CM

## 2024-08-16 DIAGNOSIS — M79641 Pain in right hand: Secondary | ICD-10-CM

## 2024-08-16 DIAGNOSIS — M25631 Stiffness of right wrist, not elsewhere classified: Secondary | ICD-10-CM

## 2024-08-17 ENCOUNTER — Ambulatory Visit: Admitting: Physical Therapy

## 2024-08-18 NOTE — Telephone Encounter (Signed)
 Okay with me

## 2024-08-19 ENCOUNTER — Ambulatory Visit (HOSPITAL_BASED_OUTPATIENT_CLINIC_OR_DEPARTMENT_OTHER): Admitting: Physical Therapy

## 2024-08-19 ENCOUNTER — Encounter (HOSPITAL_BASED_OUTPATIENT_CLINIC_OR_DEPARTMENT_OTHER): Payer: Self-pay | Admitting: Physical Therapy

## 2024-08-19 ENCOUNTER — Ambulatory Visit: Admitting: Physical Therapy

## 2024-08-19 DIAGNOSIS — M62838 Other muscle spasm: Secondary | ICD-10-CM

## 2024-08-19 DIAGNOSIS — R269 Unspecified abnormalities of gait and mobility: Secondary | ICD-10-CM

## 2024-08-19 DIAGNOSIS — R293 Abnormal posture: Secondary | ICD-10-CM

## 2024-08-19 NOTE — Telephone Encounter (Signed)
 ATC patient x1 to relay information regarding change of physician.  She is currently scheduled to f/u with Dr. Pawar

## 2024-08-19 NOTE — Telephone Encounter (Signed)
 When she calls back, we just need to find out if she wants to keep the appointment with Dr. Theodoro on 1/7 or she wants to see Dr. Neda.  Will await return call.
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
## 2024-08-24 ENCOUNTER — Ambulatory Visit: Attending: Obstetrics and Gynecology | Admitting: Physical Therapy

## 2024-08-24 DIAGNOSIS — R293 Abnormal posture: Secondary | ICD-10-CM | POA: Insufficient documentation

## 2024-08-24 DIAGNOSIS — R269 Unspecified abnormalities of gait and mobility: Secondary | ICD-10-CM | POA: Diagnosis present

## 2024-08-24 DIAGNOSIS — R279 Unspecified lack of coordination: Secondary | ICD-10-CM | POA: Diagnosis present

## 2024-08-24 DIAGNOSIS — R252 Cramp and spasm: Secondary | ICD-10-CM | POA: Diagnosis present

## 2024-08-24 DIAGNOSIS — M62838 Other muscle spasm: Secondary | ICD-10-CM | POA: Insufficient documentation
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
## 2024-08-25 NOTE — Addendum Note (Signed)
 Addended by: Johnhenry Tippin S on: 08/25/2024 09:25 AM   Modules accepted: Orders

## 2024-08-30 ENCOUNTER — Telehealth: Payer: Self-pay

## 2024-08-30 NOTE — Telephone Encounter (Signed)
 Patient was scheduled --NFN    Copied from CRM #[id number]. Topic: Appointments - Transfer of Care >> Aug 27, 2024  3:26 PM Ismael A wrote: Pt is requesting to transfer FROM: Dr. Neysa Pt is requesting to transfer TO: Dr. Theodoro Reason for requested transfer: Dr. Neysa retiring It is the responsibility of the team the patient would like to transfer to (Dr. Pawar) to reach out to the patient if for any reason this transfer is not acceptable. - She originally wanted to transfer to Dr. Neda but will stick with Dr. Pawar for now since Dr. Neda is booked up until April

## 2024-08-31 ENCOUNTER — Ambulatory Visit: Admitting: Physical Therapy

## 2024-08-31 ENCOUNTER — Encounter: Payer: Self-pay | Admitting: Physical Therapy

## 2024-08-31 DIAGNOSIS — R293 Abnormal posture: Secondary | ICD-10-CM

## 2024-08-31 DIAGNOSIS — M62838 Other muscle spasm: Secondary | ICD-10-CM

## 2024-08-31 DIAGNOSIS — R269 Unspecified abnormalities of gait and mobility: Secondary | ICD-10-CM
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
## 2024-09-03 ENCOUNTER — Ambulatory Visit: Attending: Obstetrics and Gynecology | Admitting: Physical Therapy

## 2024-09-03 ENCOUNTER — Encounter: Payer: Self-pay | Admitting: Physical Therapy

## 2024-09-03 DIAGNOSIS — R269 Unspecified abnormalities of gait and mobility: Secondary | ICD-10-CM | POA: Diagnosis present

## 2024-09-03 DIAGNOSIS — M6281 Muscle weakness (generalized): Secondary | ICD-10-CM | POA: Insufficient documentation

## 2024-09-03 DIAGNOSIS — R252 Cramp and spasm: Secondary | ICD-10-CM | POA: Insufficient documentation

## 2024-09-03 DIAGNOSIS — M62838 Other muscle spasm: Secondary | ICD-10-CM | POA: Diagnosis present

## 2024-09-03 DIAGNOSIS — R293 Abnormal posture: Secondary | ICD-10-CM | POA: Insufficient documentation

## 2024-09-03 DIAGNOSIS — R279 Unspecified lack of coordination: Secondary | ICD-10-CM | POA: Insufficient documentation

## 2024-09-03 DIAGNOSIS — R202 Paresthesia of skin: Secondary | ICD-10-CM | POA: Diagnosis present
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
## 2024-09-08 ENCOUNTER — Ambulatory Visit

## 2024-09-08 VITALS — BP 118/64 | HR 97 | Temp 97.8°F | Ht 60.0 in | Wt 107.8 lb

## 2024-09-08 DIAGNOSIS — Z9989 Dependence on other enabling machines and devices: Secondary | ICD-10-CM

## 2024-09-08 DIAGNOSIS — R942 Abnormal results of pulmonary function studies: Secondary | ICD-10-CM

## 2024-09-08 DIAGNOSIS — R142 Eructation: Secondary | ICD-10-CM

## 2024-09-08 DIAGNOSIS — G4733 Obstructive sleep apnea (adult) (pediatric): Secondary | ICD-10-CM

## 2024-09-08 DIAGNOSIS — F458 Other somatoform disorders: Secondary | ICD-10-CM

## 2024-09-08 NOTE — Progress Notes (Signed)
 "  Pulmonology Office Visit   Subjective:  Patient ID: Rochel Privett, female    DOB: May 01, 1941  MRN: [id number]  Referred by: Charlett Apolinar POUR, MD  CC:  Chief Complaint  Patient presents with   Sleep Apnea    C/o dry mouth, bloating, GERD, gas pressure in esophagus/stomach.  Tried Prilosec with no change in sx.  Sleep is good and feels better during the day.    HPI Shary Lamos is a 84 y.o. female with OSA on CPAP, Labile hypertension with autonomic dysfunction, presents for follow-up.  Last seen by NP Jordan Gillis.  Respective notes from provider reviewed as appropriate to gather relevant information for patient care.   Discussed the use of AI scribe software for clinical note transcription with the patient, who gave verbal consent to proceed.  History of Present Illness   Rhilee Currin is an 84 year old female with severe obstructive sleep apnea who presents for follow-up on her CPAP therapy.  She has a history of severe obstructive sleep apnea with significant desaturation noted during a sleep study, where her oxygen saturation dropped to 57% for 73 minutes. She was started on CPAP therapy with a pressure range of 4 to 15 cm H2O.  Since starting CPAP therapy, she experiences aerophagia, characterized by bloating and increased gas, as well as a 'super dry mouth' upon waking or when getting up at night. These symptoms began after initiating CPAP therapy, which she started approximately a month and a half after her sleep study in August 2024. She has tried multiple CPAP masks and currently uses a hybrid mask that covers her mouth and goes under her nose, which she finds to be the best fit with no leakage. Despite the mask fitting well, she continues to experience discomfort from aerophagia and dry mouth.  In terms of her respiratory status, she denies any shortness of breath and is able to walk for half an hour without difficulty. She has a history of smoking for about  three months many decades ago but is considered a nonsmoker. She recalls undergoing a lung function test, and is not aware of any symptoms related to it, as she denies any shortness of breath or decreased exercise capacity. She cleans her CPAP machine weekly, using soap and water, and ensures the water chamber is dry to prevent fungal growth.  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         OSA history: Dx in 2024> now on CPAP.   Lung Health: Functional status: ET unlimited. No SOB.  Smoking: non smoker.  Occupational exposure/pets: dog, taught at [employer]. No exposures.   PAP download compliance data: [employer] December 2025-June 2026 [employer] ResMed 11 Pressure: 4-15, max 15, median 14. Hours of usage: 8 hours 35-minute Days used >4hr: 30/30 Leak: Some leak seen.  However this leak is present every day. AHI: 3.2.  PRIOR TESTS and IMAGING: PSG/HSAT: HST 05/01/23- AHI 34.8/hr, snoring with desaturation to 57%/ 73 minutes</= 88%   06/07/2024 PFT October 2024: No obstruction or restriction, mildly reduced DLCO.  Echo August 2017 normal.      04/21/2023   10:00 AM  Results of the Epworth flowsheet  Sitting and reading 0  Watching TV 1  Sitting, inactive in a public place (e.g. 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   Urinary tract infection    Wears hearing aid    Past Surgical History:  Procedure Laterality Date   ABDOMINAL EXPOSURE N/A 05/17/2019   Procedure: ABDOMINAL EXPOSURE;  Surgeon: Eliza Lonni RAMAN, MD;  Location: [facility] OR;  Service: Vascular;  Laterality: N/A;   ABDOMINAL HYSTERECTOMY  1973   1/2 of ovary left cyst on ovary   ANTERIOR CERVICAL DECOMP/DISCECTOMY FUSION N/A 11/27/2023   Procedure: ANTERIOR CERVICAL DECOMPRESSION/DISCECTOMY FUSION 3 LEVELS;  Surgeon: Colon Shove, MD;  Location: [facility] OR;  Service: Neurosurgery;  Laterality: N/A;  Anterior Cervical Discectomy Fusion - Cervical Four-Cervical Five - Cervical Five-Cervical Six - Cervical Six-Cervical Seven   ANTERIOR LUMBAR FUSION N/A 05/17/2019   Procedure: Lumbar five Sacral one  Anterior lumbar interbody fusion;  Surgeon: Colon Shove, MD;  Location: [facility] OR;  Service: Neurosurgery;  Laterality: N/A;   APPENDECTOMY  1973   removed with hysterectomy   COLONOSCOPY     LUMBAR WOUND DEBRIDEMENT  12/06/2011   Procedure: LUMBAR WOUND DEBRIDEMENT;  Surgeon: Shove Colon, MD;  Location: [facility] NEURO ORS;  Service: Neurosurgery;  Laterality: N/A;  Repair of Pseudomeningocele Lumbar wound   shoulder rotator culff  Left 2006   SPINAL CORD STIMULATOR INSERTION  2019   at [facility] and removed 48 hours later due to extreme pain   spinal cord tumor  06/2011   TONSILLECTOMY     As a child   Family History  Problem Relation Age of Onset   Stroke Mother        2010   Heart attack Father    Cancer Paternal Grandmother    Uterine cancer Neg Hx    Bladder Cancer Neg Hx    Social History   Socioeconomic History   Marital status: Widowed    Spouse name:  Not on file   Number of children: Not on file   Years of education: Not on file   Highest education level: Doctorate  Occupational History   Not on file  Tobacco Use   Smoking status: Former    Current packs/day: 0.00    Average packs/day: 0.3 packs/day for 2.0 years (0.5 ttl pk-yrs)    Types: Cigarettes    Start date: 09/02/1965    Quit date: 09/03/1967    Years since quitting: 57.0   Smokeless tobacco: Never   Tobacco comments:    quit 48 years   Vaping Use   Vaping status: Never Used  Substance and Sexual Activity   Alcohol  use: Not Currently    Alcohol/week: 2.0 standard drinks of alcohol    Types: 2 Glasses of wine per week    Comment: weekly   Drug use: No   Sexual activity: Not Currently    Partners: Male    Birth control/protection: Surgical    Comment: hysterectomy  Other Topics Concern   Not on file  Social History Narrative   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 since [place]   Right handed   One story home   Lives alone   Social Drivers of Health   Tobacco Use: Medium Risk (09/08/2024)   Patient History    Smoking Tobacco Use: Former    Smokeless Tobacco Use: Never    Passive Exposure: Not on Actuary Strain: Low Risk (06/10/2024)   Overall Financial Resource Strain (CARDIA)    Difficulty of Paying Living Expenses: Not hard at all  Food Insecurity: No Food Insecurity (06/10/2024)   Epic    Worried About Programme Researcher, Broadcasting/film/video in the Last Year: Never true    Ran Out of Food in the Last Year: Never true  Transportation Needs: No Transportation Needs (06/10/2024)   Epic    Lack of Transportation (Medical): No    Lack of Transportation (Non-Medical): No  Physical Activity: Sufficiently Active (06/10/2024)   Exercise Vital Sign    Days of Exercise per Week: 7 days    Minutes of Exercise per Session: 30 min  Stress: No Stress Concern Present (06/10/2024)   [employer] of Occupational  Health - Occupational Stress Questionnaire    Feeling of Stress: Not at all  Recent Concern: Stress - Stress Concern Present (06/10/2024)   [employer] of Occupational Health - Occupational Stress Questionnaire    Feeling of Stress: To some extent  Social Connections: Moderately Integrated (06/10/2024)   Social Connection and Isolation Panel    Frequency of Communication with Friends and Family: More than three times a week    Frequency of Social Gatherings with Friends and Family: More than three times a week    Attends Religious Services: More than 4 times per year    Active Member of [employer] or Organizations: Yes    Attends Banker Meetings: More than 4 times per year    Marital Status: Widowed  Intimate Partner Violence: Not At Risk (06/10/2024)   Epic    Fear of Current or Ex-Partner: No    Emotionally Abused: No    Physically Abused: No    Sexually Abused: No  Depression (PHQ2-9): Low Risk (06/10/2024)   Depression (PHQ2-9)    PHQ-2 Score: 3  Alcohol Screen: Low Risk (06/10/2024)   Alcohol Screen    Last Alcohol Screening Score (AUDIT): 2  Housing: Low Risk (06/10/2024)   Epic    Unable to Pay for Housing in the Last Year: No    Number of Times Moved in the Last Year: 0    Homeless in the Last Year: No  Utilities: Not At Risk (06/10/2024)   Epic    Threatened with loss of utilities: No  Health Literacy: Adequate Health Literacy (06/10/2024)   B1300 Health Literacy    Frequency of need for help with medical instructions: Never       Objective:  BP 118/64   Pulse 97   Temp 97.8 F (36.6 C) (Temporal)   Ht 5' (1.524 m)   Wt 107 lb 12.8 oz (48.9 kg)  LMP  (LMP Unknown)   SpO2 97% Comment: room air  BMI 21.05 kg/m  BMI Readings from Last 3 Encounters:  09/08/24 21.05 kg/m  06/25/24 20.07 kg/m  06/07/24 19.84 kg/m    Physical Exam: Physical Exam   ENT: Normal mucosa. No hypertrophy of inferior turbinates. Tonsils are normal sized. Modified  Mallampati score is normal. PULMONARY: Lungs clear to auscultation bilaterally, no adventitious breath sounds. CARDIOVASCULAR: Regular rate and rhythm, S1 S2 normal, no murmurs. ABDOMEN: Abdomen soft, nontender. Bowel sounds are normal. EXTREMITIES: No peripheral edema noted.       Diagnostic Review:  Last metabolic panel Lab Results  Component Value Date   GLUCOSE 89 06/25/2024   NA 139 06/25/2024   K 4.5 06/25/2024   CL 103 06/25/2024   CO2 28 06/25/2024   BUN 23 06/25/2024   CREATININE 0.95 06/25/2024   GFRNONAA >60 11/20/2023   CALCIUM 9.7 06/25/2024   PROT 7.1 09/13/2022   ALBUMIN  4.6 09/13/2022   BILITOT 0.4 09/13/2022   ALKPHOS 32 (L) 09/13/2022   AST 23 09/13/2022   ALT 16 09/13/2022   ANIONGAP 7 11/20/2023         Assessment & Plan:   Assessment & Plan OSA (obstructive sleep apnea)  Orders:   Ambulatory Referral for DME  Aerophagia     Diffusion capacity of lung (dl), decreased        Assessment and Plan    Obstructive sleep apnea Severe obstructive sleep apnea with significant desaturation. CPAP settings frequently reach upper limit, contributing to aerophagia and dry mouth. Dental devices unsuitable due to severity. Discussed Inspire implant as alternative if symptoms persist. - Lowered CPAP pressure settings to 5-12 cm H2O. - Increased humidity level on CPAP settings. - Perform nocturnal oximetry at next visit to assess oxygenation with CPAP if OSA is still controlled at lower settings.  - Consider Inspire implant if symptoms persist and CPAP adjustments are ineffective.  Aerophagia related to CPAP therapy Aerophagia with bloating and increased gas likely due to CPAP therapy. Current settings may contribute to symptoms. - Lowered CPAP pressure settings to 5-12 cm H2O to reduce aerophagia. - Monitor symptoms and adjust settings further if necessary.   Reduced DLCO: - asymptomatic. Will get ECHO if symptomatic.         Notes from Candis Dandy in October 2025 reviewed as to gather relevant information for patient care and formulating plan.   Return in about 3 months (around 12/07/2024).   I personally spent a total of 30 minutes in the care of the patient today including preparing to see the patient, getting/reviewing separately obtained history, performing a medically appropriate exam/evaluation, counseling and educating, placing orders, documenting clinical information in the EHR, independently interpreting results, and communicating results.   Tomie Elko, MD     [1]  Current Outpatient Medications:    estradiol  (ESTRACE ) 0.1 MG/GM vaginal cream, Place 0.5g nightly for two weeks then twice a week after, Disp: 42.5 g, Rfl: 11   lamoTRIgine  (LAMICTAL ) 25 MG tablet, Take 1 tablet every evening, Disp: 90 tablet, Rfl: 4   MAGNESIUM PO, Take 1 tablet by mouth daily., Disp: , Rfl:    NON FORMULARY, Pt uses a cpap nightly, Disp: , Rfl:    pyridoxine (B-6) 200 MG tablet, Take 200 mg by mouth daily., Disp: , Rfl:    traMADol  (ULTRAM ) 50 MG tablet, Take 1 tablet (50 mg total) by mouth 2 (two) times daily as needed., Disp: 60 tablet, Rfl: 0  Current  Facility-Administered Medications:    [START ON 12/24/2024] denosumab  (PROLIA ) injection 60 mg, 60 mg, Subcutaneous, Once, Gherghe, Cristina, MD  "

## 2024-09-08 NOTE — Assessment & Plan Note (Addendum)
  Orders:   Ambulatory Referral for DME

## 2024-09-08 NOTE — Patient Instructions (Signed)
" °  VISIT SUMMARY:  Today, we discussed your ongoing treatment for severe obstructive sleep apnea and the issues you have been experiencing with your CPAP therapy. We reviewed your symptoms of aerophagia and dry mouth, and made adjustments to your CPAP settings to help alleviate these problems.  YOUR PLAN:  -OBSTRUCTIVE SLEEP APNEA: Obstructive sleep apnea is a condition where your airway becomes blocked during sleep, causing breathing pauses and drops in oxygen levels. We have lowered your CPAP pressure settings to 5-12 cm H2O and increased the humidity level to help reduce your symptoms. We will perform nocturnal oximetry at your next visit to check your oxygen levels while using CPAP. If your symptoms persist, we may consider an Inspire implant as an alternative treatment.  -AEROPHAGIA RELATED TO CPAP THERAPY: Aerophagia is the swallowing of air that leads to bloating and increased gas, which can be a side effect of CPAP therapy. We have adjusted your CPAP pressure settings to 5-12 cm H2O to help reduce these symptoms. Please monitor your symptoms and let us  know if further adjustments are needed.                       Contains text generated by Abridge.                                 Contains text generated by Abridge.   "

## 2024-09-09 ENCOUNTER — Encounter: Payer: Self-pay | Admitting: Physical Therapy

## 2024-09-09 ENCOUNTER — Ambulatory Visit: Admitting: Physical Therapy

## 2024-09-09 DIAGNOSIS — R293 Abnormal posture: Secondary | ICD-10-CM | POA: Diagnosis not present

## 2024-09-09 DIAGNOSIS — M62838 Other muscle spasm: Secondary | ICD-10-CM

## 2024-09-09 DIAGNOSIS — M6281 Muscle weakness (generalized): Secondary | ICD-10-CM

## 2024-09-09 DIAGNOSIS — R269 Unspecified abnormalities of gait and mobility: Secondary | ICD-10-CM
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
## 2024-09-10 ENCOUNTER — Ambulatory Visit: Admitting: Physical Therapy

## 2024-09-10 ENCOUNTER — Encounter: Admitting: Rehabilitative and Restorative Service Providers"

## 2024-09-10 ENCOUNTER — Telehealth: Payer: Self-pay

## 2024-09-10 NOTE — Therapy (Signed)
 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   Urinary tract infection    Wears hearing aid    Past Surgical History:  Procedure Laterality Date   ABDOMINAL EXPOSURE N/A 05/17/2019   Procedure: ABDOMINAL EXPOSURE;  Surgeon: Eliza Lonni RAMAN, MD;  Location: [facility] OR;  Service: Vascular;  Laterality: N/A;   ABDOMINAL HYSTERECTOMY  1973   1/2 of ovary left cyst on ovary   ANTERIOR CERVICAL DECOMP/DISCECTOMY FUSION N/A 11/27/2023   Procedure: ANTERIOR CERVICAL DECOMPRESSION/DISCECTOMY FUSION 3 LEVELS;  Surgeon: Colon Shove, MD;  Location: [facility] OR;  Service: Neurosurgery;  Laterality: N/A;  Anterior Cervical Discectomy Fusion - Cervical Four-Cervical Five - Cervical Five-Cervical Six - Cervical Six-Cervical Seven   ANTERIOR LUMBAR FUSION N/A 05/17/2019   Procedure: Lumbar five Sacral one  Anterior lumbar interbody fusion;  Surgeon: Colon Shove, MD;  Location: [facility] OR;  Service: Neurosurgery;  Laterality: N/A;   APPENDECTOMY  1973   removed with hysterectomy   COLONOSCOPY     LUMBAR WOUND DEBRIDEMENT  12/06/2011   Procedure: LUMBAR WOUND DEBRIDEMENT;  Surgeon: Shove Colon, MD;  Location: [facility];  Service: Neurosurgery;  Laterality: N/A;  Repair of  Pseudomeningocele Lumbar wound   shoulder rotator culff  Left 2006   SPINAL CORD STIMULATOR INSERTION  2019   at [facility] and removed 48 hours later due to extreme pain   spinal cord tumor  06/2011   TONSILLECTOMY     As a child   Patient Active Problem List   Diagnosis Date Noted   Cervical myelopathy (HCC) 11/27/2023   OSA (obstructive sleep apnea) 04/21/2023   Dyspnea on exertion 04/21/2023   Foot drop, right foot 02/08/2021   Midline cystocele 12/31/2019   Spondylolisthesis at L5-S1 level 05/17/2019   Tension-type headache, not intractable 06/11/2017   Right leg weakness 06/11/2017   Labile hypertension 09/27/2016   Autonomic dysfunction 09/27/2016   Localization-related idiopathic epilepsy and epileptic syndromes with seizures of localized onset, not intractable, without status epilepticus (HCC) 08/01/2016   Hyponatremia 04/01/2016   Nocturnal leg cramps 12/07/2015   Spinal stenosis, lumbar region, 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   Osteoporosis 12/26/2013   Glucosuria 12/16/2012   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/11/2011   Fatigue 11/11/2011   Medicare annual wellness visit, initial 12/18/2010   Wears hearing aid 12/18/2010   Back pain 11/23/2010   ABDOMINAL BRUIT 07/21/2009    ONSET DATE: March 2025  REFERRING DIAG: G56.01 (ICD-10-CM) - Carpal tunnel syndrome, right upper limb   THERAPY DIAG:  No diagnosis found.  Rationale for Evaluation and Treatment: Rehabilitation  PERTINENT HISTORY:  Cervical fusion, unstable gait and balance history, past cancer history and others She states she had a cervical fusion in March to help with nerve pain running down her right arm.  She states her pain got better, but eventually the right hand started hurting. She denies numbness but feels sharp, needle-like pain. It gets worse with use, but is not effected at night.  She states it's digits 4, 5 that hurt,   NOT 1-3.   PRECAUTIONS: Fall (use close proximity, continue  recommend PT and mobility devices)  RED FLAGS: None   WEIGHT BEARING RESTRICTIONS: No   SUBJECTIVE:   SUBJECTIVE STATEMENT: She hasn't been seen in OT for ~3 weeks. She returns today, states ***.     PAIN:  Are you having pain? Yes: NPRS scale: *** 5-6/10 now at rest (11AM) Pain location: Right hand digits 4 and 5 Pain description: Pins-and-needles sharp Aggravating factors: Pressing through palm Relieving factors: Rest  FALLS: Has patient fallen in last 6 months? Yes. Number of falls 1-considered mild risk of falls, does typically use a device and sees PT  LIVING ENVIRONMENT: Lives with: lives alone Lives in: House/apartment Has following equipment at home: Retail Banker - 2 wheeled  PLOF: Independent with household mobility with device and Independent with community mobility with device  PATIENT GOALS: To improve pain in the right hand  NEXT MD VISIT: As needed   OBJECTIVE: (All objective assessments below are from initial evaluation on: 08/16/24 unless otherwise specified.)   HAND DOMINANCE: Right   ADLs: Overall ADLs: States decreased ability to grab, hold household objects, pain and difficulty to open containers, perform FMS tasks (manipulate fasteners on clothing), etc   FUNCTIONAL OUTCOME MEASURES: Eval: Patient Specific Functional Scale: 2 (lifting with Rt hand, open jars, using walker)  (Higher Score  =  Better Ability for the Selected Tasks)      UPPER EXTREMITY ROM     Shoulder to Wrist AROM Right eval  Shoulder flexion   Shoulder abduction   Shoulder extension   Shoulder internal rotation   Shoulder external rotation   Elbow flexion   Elbow extension   Forearm supination 85  Forearm pronation  85  Wrist flexion 63  Wrist extension 65  Wrist ulnar deviation   Wrist radial deviation   Functional dart thrower's motion (F-DTM) in ulnar flexion   F-DTM in radial extension    (Blank rows = not tested)   Hand AROM  Right eval  Full Fist Ability (or Gap to Distal Palmar Crease) full  Thumb Opposition  (Kapandji Scale)  WFL  Thumb MCP (0-60)   Thumb IP (0-80)   Thumb Radial Abduction Span   Thumb Palmar Abduction Span   Index MCP (0-90)   Index PIP (0-100)   Index DIP (0-70)    Long MCP (0-90)    Long PIP (0-100)    Long DIP (0-70)    Ring MCP (0-90)    Ring PIP (0-100)    Ring DIP (0-70)    Little MCP (0-90)    Little PIP (0-100)    Little DIP (0-70)    (Blank rows = not tested)   HAND FUNCTION: 09/13/24: Grip Rt: ***   Eval: Observed weakness in affected Rt hand.  She also showed signs of intrinsic muscle weakness, ulnar nerve innervated. Grip strength Right: 26 lbs, Left: 30 lbs   COORDINATION: Eval: No significant coordination complaints are noted impairments today in right hand.  SENSATION: Eval:  Sharie  touch intact today,  though somewhat diminished near Guyon's canal and paresthesias reported dorsally and volarly in the last 2 fingers of the right hand  OBSERVATIONS:   Eval:  seems to have some tightness through Rt ECU and also irritable ulnar nerve at elbow and Guyon's canal with Tinel's Testing.  Tinel's test was negative for radial nerve.   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  She does have some apparent muscle wasting to the hyperthenar eminence.  Finger abduction and adduction were both somewhat weak in the right hand compared to the left   TODAY'S TREATMENT:  09/13/24: *** Review initial HEP and recommendations as needed and helpful over the next 6 weeks.  The holidays are here and it might be difficult for her to get in but she was recommended to come in if she can/needs to   Exercises - Ulnar Nerve Flossing  - 3-4 x daily - 1-2 sets - 5-10 reps - Ulnar Nerve Butterfly  - 4-6 x daily - 1 sets - 10-15 reps - Wrist Prayer Stretch  - 4 x daily - 3-5 reps - 15 sec hold - Wrist Flexion Stretch  - 4 x daily - 3-5 reps - 15 sec hold - HOOK Stretch  - 4 x  daily - 3-5 reps - 15-20 sec hold - Tendon Glides  - 4-6 x daily - 3-5 reps - 2-3 seconds hold   PATIENT EDUCATION: Education details: See tx section above for details  Person educated: Patient Education method: Engineer, Structural, Teach back, Handouts  Education comprehension: States and demonstrates understanding, Additional Education required    HOME EXERCISE PROGRAM: Access Code: [facility] URL: [email or web address] Date: 08/16/2024 Prepared by: Melvenia Ada   GOALS: Goals reviewed with patient? Yes   SHORT TERM GOALS: (STG required if POC>30 days) Target Date: 08/16/24  Pt will demo/state understanding of initial HEP to improve pain levels and prerequisite motion. Goal status: MET   LONG TERM GOALS: Target Date: 10/01/24  Pt will improve functional ability by decreased impairment per PSFS assessment from 2 to 6 or better, for better quality of life. Goal status: INITIAL  2.  Pt will improve grip strength in Rt hand from 26lbs to at least 30lbs for functional use at home and in IADLs. Goal status: INITIAL  3.  Pt will improve A/ROM in Rt wrist flexion from 63 degrees to at least 66 degrees, to have functional motion for tasks like reach and grasp.  Goal status: INITIAL  4.  Pt will decrease pain in right hand at rest from 5-6/10 to 2/10 or better to have better sleep and occupational participation in daily roles. Goal status: INITIAL   ASSESSMENT:  CLINICAL IMPRESSION: 09/13/24: ***  Eval: Patient is a 84 y.o. female who was seen today for occupational therapy evaluation for paresthesia, pain, stiffness through the right hand and ulnar nerve distribution and small 2 fingers.  She states that this causes pain, decreased ability to perform daily activities and affects balance and safety with walker use.  The patient will benefit from outpatient occupational therapy to decrease symptoms, improve functional upper extremity use, and increase quality of  life.   PLAN:  OT FREQUENCY: 1-2x/week  OT DURATION: 6 weeks through 10/02/23 and up to 5 total visits as needed   PLANNED INTERVENTIONS: 97535 self care/ADL training, 02889 therapeutic exercise, 97530 therapeutic activity, 97112 neuromuscular re-education, 97140 manual therapy, 97035 ultrasound, 97032 electrical stimulation (manual), [id number] Orthotic Initial, [id number] Orthotic/Prosthetic subsequent, compression bandaging, Dry needling,  energy conservation, coping strategies training, and patient/family education  RECOMMENDED OTHER SERVICES: none now, perhaps PT for continued gait/balance/foot drop issues  CONSULTED AND AGREED WITH PLAN OF CARE: Patient  PLAN FOR NEXT SESSION:   ***  Melvenia Ada, OTR/L, CHT  09/10/2024, 12:29 PM "

## 2024-09-10 NOTE — Telephone Encounter (Signed)
 Received CMN from [facility] . Placed into Dr. Lavena sign folder. Once signed will fax back to [place] at [phone]

## 2024-09-13 ENCOUNTER — Encounter: Payer: Self-pay | Admitting: *Deleted

## 2024-09-13 ENCOUNTER — Ambulatory Visit: Admitting: Rehabilitative and Restorative Service Providers"

## 2024-09-13 ENCOUNTER — Encounter: Payer: Self-pay | Admitting: Rehabilitative and Restorative Service Providers"

## 2024-09-13 DIAGNOSIS — R202 Paresthesia of skin: Secondary | ICD-10-CM

## 2024-09-13 DIAGNOSIS — M6281 Muscle weakness (generalized): Secondary | ICD-10-CM

## 2024-09-14 ENCOUNTER — Ambulatory Visit: Admitting: Physical Therapy

## 2024-09-14 ENCOUNTER — Encounter: Payer: Self-pay | Admitting: Physical Therapy

## 2024-09-14 DIAGNOSIS — R293 Abnormal posture: Secondary | ICD-10-CM | POA: Diagnosis not present

## 2024-09-14 DIAGNOSIS — M62838 Other muscle spasm: Secondary | ICD-10-CM

## 2024-09-14 DIAGNOSIS — M6281 Muscle weakness (generalized): Secondary | ICD-10-CM
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
## 2024-09-15 ENCOUNTER — Telehealth: Payer: Self-pay

## 2024-09-15 NOTE — Telephone Encounter (Signed)
 Copied from CRM [id number]. Topic: Clinical - Medical Advice >> Sep 14, 2024  4:03 PM Devaughn RAMAN wrote: Reason for CRM: Pt stated at her last office visit Dr.Pawar lowered her CPAP machine to  Lowered CPAP pressure settings to 5-12 cm H2O to reduce aerophagia and now it is just shutting off, pt stated it never runs through the night and it is constantly shutting off. Pt would like a callback and assistance with this.   Called and spoke to pt. Pt states that the machine is no longer shutting off and that she just got an email this morning from [employer] stating that they instituted the pressure change that was recently ordered by Dr. Pawar. Pt states that she will try to use the CPAP machine again, as [employer] just changed the pressure, to see if it is working better for her.  Advised pt to contact [employer] if her machine begins to malfunction again, and to call us  back if she feels that the new settings on her machine are not working well for her. Pt verbalized understanding, NFN.

## 2024-09-21 ENCOUNTER — Ambulatory Visit: Admitting: Physical Therapy

## 2024-09-21 ENCOUNTER — Encounter: Payer: Self-pay | Admitting: Physical Therapy

## 2024-09-21 DIAGNOSIS — R293 Abnormal posture: Secondary | ICD-10-CM | POA: Diagnosis not present

## 2024-09-21 DIAGNOSIS — M62838 Other muscle spasm: Secondary | ICD-10-CM

## 2024-09-21 DIAGNOSIS — M6281 Muscle weakness (generalized): Secondary | ICD-10-CM
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
## 2024-09-22 NOTE — Therapy (Signed)
 " OUTPATIENT OCCUPATIONAL THERAPY TREATMENT & PROGRESS NOTE  Patient Name: Kaitlyn Flowers MRN: [id number] DOB:1941/05/02, 84 y.o., female Today's Date: 09/24/2024  PCP: Charlett MOHR MD REFERRING PROVIDER:  Colon Shove, MD                Progress Note Reporting Period 08/16/24 to 09/24/24   CLINICAL IMPRESSION: 09/24/24: She is now doing home more consistently and being more careful about her ulnar nerve pain.  OT feels that she will need at least 3 to 4 weeks of self-management before there is a measurable change.  OT will request additional authorization to see her in 3 to 4 weeks to check home exercises, check sensation and strength and ensure issues are improving.     PLAN:  OT FREQUENCY: Up to 1 time per week  OT DURATION: 3 additional weeks through 10/02/23 - 10/22/24 and up to 5 total visits as needed    See note below for Objective Data and Assessment of Progress/Goals.           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   Urinary tract infection    Wears hearing aid    Past  Surgical History:  Procedure Laterality Date   ABDOMINAL EXPOSURE N/A 05/17/2019   Procedure: ABDOMINAL EXPOSURE;  Surgeon: Eliza Lonni RAMAN, MD;  Location: [facility] OR;  Service: Vascular;  Laterality: N/A;   ABDOMINAL HYSTERECTOMY  1973   1/2 of ovary left cyst on ovary   ANTERIOR CERVICAL DECOMP/DISCECTOMY FUSION N/A 11/27/2023   Procedure: ANTERIOR CERVICAL DECOMPRESSION/DISCECTOMY FUSION 3 LEVELS;  Surgeon: Colon Shove, MD;  Location: [facility] OR;  Service: Neurosurgery;  Laterality: N/A;  Anterior Cervical Discectomy Fusion - Cervical Four-Cervical Five - Cervical Five-Cervical Six - Cervical Six-Cervical Seven   ANTERIOR LUMBAR FUSION N/A 05/17/2019   Procedure: Lumbar five Sacral one  Anterior lumbar interbody fusion;  Surgeon: Colon Shove, MD;  Location: [facility] OR;  Service: Neurosurgery;  Laterality: N/A;   APPENDECTOMY  1973   removed with hysterectomy   COLONOSCOPY     LUMBAR WOUND DEBRIDEMENT  12/06/2011   Procedure: LUMBAR WOUND DEBRIDEMENT;  Surgeon: Shove Colon, MD;  Location: [facility] NEURO ORS;  Service: Neurosurgery;  Laterality: N/A;  Repair of Pseudomeningocele Lumbar wound   shoulder rotator culff  Left 2006  SPINAL CORD STIMULATOR INSERTION  2019   at [facility] and removed 48 hours later due to extreme pain   spinal cord tumor  06/2011   TONSILLECTOMY     As a child   Patient Active Problem List   Diagnosis Date Noted   Cervical myelopathy (HCC) 11/27/2023   OSA (obstructive sleep apnea) 04/21/2023   Dyspnea on exertion 04/21/2023   Foot drop, right foot 02/08/2021   Midline cystocele 12/31/2019   Spondylolisthesis at L5-S1 level 05/17/2019   Tension-type headache, not intractable 06/11/2017   Right leg weakness 06/11/2017   Labile hypertension 09/27/2016   Autonomic dysfunction 09/27/2016   Localization-related idiopathic epilepsy and epileptic syndromes with seizures of localized onset, not intractable, without status epilepticus (HCC) 08/01/2016   Hyponatremia 04/01/2016    Nocturnal leg cramps 12/07/2015   Spinal stenosis, lumbar region, 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   Osteoporosis 12/26/2013   Glucosuria 12/16/2012   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/11/2011   Fatigue 11/11/2011   Medicare annual wellness visit, initial 12/18/2010   Wears hearing aid 12/18/2010   Back pain 11/23/2010   ABDOMINAL BRUIT 07/21/2009    ONSET DATE: March 2025  REFERRING DIAG: G56.01 (ICD-10-CM) - Carpal tunnel syndrome, right upper limb   THERAPY DIAG:  Muscle weakness (generalized)  Paresthesia of skin  Rationale for Evaluation and Treatment: Rehabilitation  PERTINENT HISTORY: Cervical fusion, unstable gait and balance history, past cancer history and others She states she had a cervical fusion in March to help with nerve pain running down her right arm.  She states her pain got better, but eventually the right hand started hurting. She denies numbness but feels sharp, needle-like pain. It gets worse with use, but is not effected at night.  She states it's digits 4, 5 that hurt,   NOT 1-3.   PRECAUTIONS: Fall (use close proximity, continue recommend PT and mobility devices)  RED FLAGS: None   WEIGHT BEARING RESTRICTIONS: No   SUBJECTIVE:   SUBJECTIVE STATEMENT: She returns today, states she is not feeling a significant change yet, however she thinks her pain and paresthesia is slightly improved and she is more mindful     PAIN:  Are you having pain? Yes: NPRS scale: 5/10 now at rest Pain location: Right hand digits 4 and 5 Pain description: Pins-and-needles sharp Aggravating factors: Pressing through palm Relieving factors: Rest  FALLS: Has patient fallen in last 6 months? Yes. Number of falls 1-considered mild risk of falls, does typically use a device and sees PT  LIVING ENVIRONMENT: Lives with: lives alone Lives in: House/apartment Has following equipment at home: [employer] - 2 wheeled  PLOF: Independent  with household mobility with device and Independent with community mobility with device  PATIENT GOALS: To improve pain in the right hand  NEXT MD VISIT: As needed   OBJECTIVE: (All objective assessments below are from initial evaluation on: 08/16/24 unless otherwise specified.)   HAND DOMINANCE: Right   ADLs: Overall ADLs: States decreased ability to grab, hold household objects, pain and difficulty to open containers, perform FMS tasks (manipulate fasteners on clothing), etc   FUNCTIONAL OUTCOME MEASURES: Eval: Patient Specific Functional Scale: 2 (lifting with Rt hand, open jars, using walker)  (Higher Score  =  Better Ability for the Selected Tasks)      UPPER EXTREMITY ROM     Shoulder to Wrist AROM Right eval Rt 09/13/24 Rt 09/24/24  Shoulder flexion     Shoulder abduction  Shoulder extension     Shoulder internal rotation     Shoulder external rotation     Elbow flexion     Elbow extension     Forearm supination 85    Forearm pronation  85    Wrist flexion 63 53 57 (55 in Lt)  Wrist extension 65 62 62  (67 in Lt)  Wrist ulnar deviation     Wrist radial deviation     Functional dart thrower's motion (F-DTM) in ulnar flexion     F-DTM in radial extension      (Blank rows = not tested)   Hand AROM Right eval  Full Fist Ability (or Gap to Distal Palmar Crease) full  Thumb Opposition  (Kapandji Scale)  WFL  Thumb MCP (0-60)   Thumb IP (0-80)   Thumb Radial Abduction Span   Thumb Palmar Abduction Span   Index MCP (0-90)   Index PIP (0-100)   Index DIP (0-70)    Long MCP (0-90)    Long PIP (0-100)    Long DIP (0-70)    Ring MCP (0-90)    Ring PIP (0-100)    Ring DIP (0-70)    Little MCP (0-90)    Little PIP (0-100)    Little DIP (0-70)    (Blank rows = not tested)   HAND FUNCTION: 09/24/24: Grip Rt: 29#    09/13/24: Grip Rt: 27lbs   Eval: Observed weakness in affected Rt hand.  She also showed signs of intrinsic muscle weakness, ulnar nerve  innervated. Grip strength Right: 26 lbs, Left: 30 lbs   SENSATION: 09/24/24: S2PDT Rt Ulnar >38mm gap (off the chart), Median 5mm;  Lt Ulnar 4mm, Median 4mm;  SWMFT Rt SF- 3.61 - diminished LT     Eval:  Light touch intact today,  though somewhat diminished near Guyon's canal and paresthesias reported dorsally and volarly in the last 2 fingers of the right hand  OBSERVATIONS:   Eval:  seems to have some tightness through Rt ECU and also irritable ulnar nerve at elbow and Guyon's canal with Tinel's Testing.  Tinel's test was negative for radial nerve.   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  She does have some apparent muscle wasting to the hyperthenar eminence.  Finger abduction and adduction were both somewhat weak in the right hand compared to the left   TODAY'S TREATMENT:  09/24/24: Today we thoroughly review her home exercise program and the intent of it.  OT reviews safety and precautions with nerve impingements as well as bed strategies and also provides a cushion to elbow sleeve to help prevent pressure to the cubital tunnel.  OT does specific nerve testing which does confirm again ulnar nerve in the right hand is greatly impacted.  Fortunately her movement and her grip strength are both improving somewhat which is a good sign, however nerve impingements can take quite a while for pain to resolve.  We discussed her self managing with home exercises and returning in 3 to 4 weeks to check if sensation is improving which it should be.  She states that she is comfortable with self-management during that time period, but she should return if anything is acutely bothering her.    PATIENT EDUCATION: Education details: See tx section above for details  Person educated: Patient Education method: Verbal Instruction, Teach back, Handouts  Education comprehension: States and demonstrates understanding, Additional Education required    HOME EXERCISE PROGRAM: Access Code:  [facility] URL: [email or web address] Date:  08/16/2024 Prepared by: Melvenia Ada   GOALS: Goals reviewed with patient? Yes   SHORT TERM GOALS: (STG required if POC>30 days) Target Date: 08/16/24  Pt will demo/state understanding of initial HEP to improve pain levels and prerequisite motion. Goal status: MET   LONG TERM GOALS: Target Date: 10/01/24  Pt will improve functional ability by decreased impairment per PSFS assessment from 2 to 6 or better, for better quality of life. Goal status: INITIAL  2.  Pt will improve grip strength in Rt hand from 26lbs to at least 30lbs for functional use at home and in IADLs. Goal status: INITIAL  3.  Pt will improve A/ROM in Rt wrist flexion from 63 degrees to at least 66 degrees, to have functional motion for tasks like reach and grasp.  Goal status: INITIAL  4.  Pt will decrease pain in right hand at rest from 5-6/10 to 2/10 or better to have better sleep and occupational participation in daily roles. Goal status: INITIAL   ASSESSMENT:  CLINICAL IMPRESSION: 09/24/24: She is now doing home more consistently and being more careful about her ulnar nerve pain.  OT feels that she will need at least 3 to 4 weeks of self-management before there is a measurable change.  OT will request additional authorization to see her in 3 to 4 weeks to check home exercises, check sensation and strength and ensure issues are improving.     PLAN:  OT FREQUENCY: Up to 1 time per week  OT DURATION: 3 additional weeks through 10/02/23 - 10/22/24 and up to 5 total visits as needed   PLANNED INTERVENTIONS: 97535 self care/ADL training, 02889 therapeutic exercise, 97530 therapeutic activity, 97112 neuromuscular re-education, 97140 manual therapy, 97035 ultrasound, 97032 electrical stimulation (manual), 97760 Orthotic Initial, 97763 Orthotic/Prosthetic subsequent, compression bandaging, Dry needling, energy conservation, coping strategies training,  and patient/family education  RECOMMENDED OTHER SERVICES: none now, perhaps PT for continued gait/balance/foot drop issues  CONSULTED AND AGREED WITH PLAN OF CARE: Patient  PLAN FOR NEXT SESSION:   Follow-up in 3 to 4 weeks to check static 2-point discrimination, Gustabo Speed, HEP, give updated recommendations as appropriate.   Melvenia Ada, OTR/L, CHT  09/24/2024, 11:58 AM   Referring diagnosis? G56.01 (ICD-10-CM) - Carpal tunnel syndrome, right upper limb  Treatment diagnosis? (if different than referring diagnosis) R20.2 What was this (referring dx) caused by? []  Surgery []  Fall [x]  Ongoing issue []  Arthritis []  Other: ____________  Laterality: [x]  Rt []  Lt []  Both  Check all possible CPT codes:  *CHOOSE 10 OR LESS*    See Planned Interventions listed in the Plan section of the Evaluation.   "

## 2024-09-23 NOTE — Telephone Encounter (Signed)
 Faxed back to [facility] [phone].

## 2024-09-24 ENCOUNTER — Ambulatory Visit: Admitting: Rehabilitative and Restorative Service Providers"

## 2024-09-24 ENCOUNTER — Telehealth: Payer: Self-pay

## 2024-09-24 ENCOUNTER — Encounter: Payer: Self-pay | Admitting: Rehabilitative and Restorative Service Providers"

## 2024-09-24 DIAGNOSIS — M6281 Muscle weakness (generalized): Secondary | ICD-10-CM

## 2024-09-24 DIAGNOSIS — R202 Paresthesia of skin: Secondary | ICD-10-CM

## 2024-09-24 NOTE — Telephone Encounter (Signed)
 Faxed signed CMN to [facility] at [phone]. Fax confirmation received, NFN.

## 2024-09-28 ENCOUNTER — Ambulatory Visit: Admitting: Physical Therapy

## 2024-09-29 ENCOUNTER — Ambulatory Visit: Admitting: Physical Therapy

## 2024-09-29 ENCOUNTER — Encounter: Payer: Self-pay | Admitting: Physical Therapy

## 2024-09-29 ENCOUNTER — Telehealth: Payer: Self-pay

## 2024-09-29 DIAGNOSIS — R293 Abnormal posture: Secondary | ICD-10-CM | POA: Diagnosis not present

## 2024-09-29 DIAGNOSIS — R279 Unspecified lack of coordination: Secondary | ICD-10-CM

## 2024-09-29 DIAGNOSIS — R252 Cramp and spasm: Secondary | ICD-10-CM

## 2024-09-29 DIAGNOSIS — R202 Paresthesia of skin: Secondary | ICD-10-CM

## 2024-09-29 DIAGNOSIS — R269 Unspecified abnormalities of gait and mobility: Secondary | ICD-10-CM

## 2024-09-29 DIAGNOSIS — M62838 Other muscle spasm: Secondary | ICD-10-CM

## 2024-09-29 DIAGNOSIS — M6281 Muscle weakness (generalized): Secondary | ICD-10-CM

## 2024-09-29 NOTE — Telephone Encounter (Unsigned)
 Copied from CRM #[id number]. Topic: General - Other >> Sep 28, 2024  3:48 PM LaVerne A wrote: Reason for CRM: Patient wants someone to call her back because her CPAP machine stopped working.  She spoke to the [employer] and they suggested she call the doctor.  Her phone number is [phone].  Thanks.    Spoke with patient VBU, she states that she will just keep track of it because something times it has been more than 5 , but does not feel that she needs to come in earlier.   -NFN
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
## 2024-09-29 NOTE — Telephone Encounter (Signed)
 Copied from CRM #[id number]. Topic: General - Other >> Sep 28, 2024  3:48 PM Kaitlyn Flowers wrote: Reason for CRM: Patient wants someone to call her back because her CPAP machine stopped working.  She spoke to the [employer] and they suggested she call the doctor.  Her phone number is [phone].  Thanks.   Spoke with patient Kaitlyn Flowers, pt states her machine is fine. Its the settings she has gone from only having episodes  to having 5 per [employer]    Reach out to MD

## 2024-10-05 ENCOUNTER — Encounter: Payer: Self-pay | Admitting: Physical Therapy

## 2024-10-05 ENCOUNTER — Ambulatory Visit: Admitting: Physical Therapy

## 2024-10-05 DIAGNOSIS — M6281 Muscle weakness (generalized): Secondary | ICD-10-CM

## 2024-10-05 DIAGNOSIS — R293 Abnormal posture: Secondary | ICD-10-CM

## 2024-10-05 DIAGNOSIS — R279 Unspecified lack of coordination: Secondary | ICD-10-CM

## 2024-10-06 ENCOUNTER — Encounter: Payer: Self-pay | Admitting: Physical Therapy

## 2024-10-06 ENCOUNTER — Ambulatory Visit: Admitting: Physical Therapy

## 2024-10-06 DIAGNOSIS — R252 Cramp and spasm: Secondary | ICD-10-CM

## 2024-10-06 DIAGNOSIS — M6281 Muscle weakness (generalized): Secondary | ICD-10-CM

## 2024-10-06 DIAGNOSIS — M62838 Other muscle spasm: Secondary | ICD-10-CM

## 2024-10-06 DIAGNOSIS — R279 Unspecified lack of coordination: Secondary | ICD-10-CM

## 2024-10-06 DIAGNOSIS — R269 Unspecified abnormalities of gait and mobility: Secondary | ICD-10-CM

## 2024-10-06 DIAGNOSIS — R202 Paresthesia of skin: Secondary | ICD-10-CM

## 2024-10-06 DIAGNOSIS — R293 Abnormal posture: Secondary | ICD-10-CM
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
## 2024-10-08 NOTE — Therapy (Incomplete)
 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   Urinary tract infection    Wears hearing aid    Past Surgical History:  Procedure Laterality Date   ABDOMINAL EXPOSURE N/A 05/17/2019   Procedure: ABDOMINAL EXPOSURE;  Surgeon: Eliza Lonni RAMAN, MD;  Location: [facility] OR;  Service: Vascular;  Laterality: N/A;   ABDOMINAL HYSTERECTOMY  1973   1/2 of ovary left cyst on ovary   ANTERIOR CERVICAL DECOMP/DISCECTOMY FUSION N/A 11/27/2023   Procedure: ANTERIOR CERVICAL DECOMPRESSION/DISCECTOMY FUSION 3 LEVELS;  Surgeon: Colon Shove, MD;  Location: [facility] OR;  Service: Neurosurgery;  Laterality: N/A;  Anterior Cervical Discectomy Fusion - Cervical Four-Cervical Five - Cervical Five-Cervical Six - Cervical Six-Cervical Seven   ANTERIOR LUMBAR FUSION N/A 05/17/2019   Procedure: Lumbar five Sacral one  Anterior lumbar interbody fusion;  Surgeon: Colon Shove, MD;  Location: [facility] OR;  Service: Neurosurgery;  Laterality: N/A;   APPENDECTOMY  1973   removed with hysterectomy   COLONOSCOPY     LUMBAR WOUND DEBRIDEMENT  12/06/2011   Procedure: LUMBAR WOUND DEBRIDEMENT;  Surgeon: Shove Colon, MD;  Location: [facility] OR[facility];  Service: Neurosurgery;  Laterality: N/A;  Repair of  Pseudomeningocele Lumbar wound   shoulder rotator culff  Left 2006   SPINAL CORD STIMULATOR INSERTION  2019   at [facility] and removed 48 hours later due to extreme pain   spinal cord tumor  06/2011   TONSILLECTOMY     As a child   Patient Active Problem List   Diagnosis Date Noted   Cervical myelopathy (HCC) 11/27/2023   OSA (obstructive sleep apnea) 04/21/2023   Dyspnea on exertion 04/21/2023   Foot drop, right foot 02/08/2021   Midline cystocele 12/31/2019   Spondylolisthesis at L5-S1 level 05/17/2019   Tension-type headache, not intractable 06/11/2017   Right leg weakness 06/11/2017   Labile hypertension 09/27/2016   Autonomic dysfunction 09/27/2016   Localization-related idiopathic epilepsy and epileptic syndromes with seizures of localized onset, not intractable, without status epilepticus (HCC) 08/01/2016   Hyponatremia 04/01/2016   Nocturnal leg cramps 12/07/2015   Spinal stenosis, lumbar region, 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   Osteoporosis 12/26/2013   Glucosuria 12/16/2012   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/11/2011   Fatigue 11/11/2011   Medicare annual wellness visit, initial 12/18/2010   Wears hearing aid 12/18/2010   Back pain 11/23/2010   ABDOMINAL BRUIT 07/21/2009    ONSET DATE: March 2025  REFERRING DIAG: G56.01 (ICD-10-CM) - Carpal tunnel syndrome, right upper limb   THERAPY DIAG:  No diagnosis found.  Rationale for Evaluation and Treatment: Rehabilitation  PERTINENT HISTORY: Cervical fusion, unstable gait and balance history, past cancer history and others She states she had a cervical fusion in March to help with nerve pain running down her right arm.  She states her pain got better, but eventually the right hand started hurting. She denies numbness but feels sharp, needle-like pain. It gets worse with use, but is not effected at night.  She states it's digits 4, 5 that hurt,   NOT 1-3.   PRECAUTIONS: Fall (use close proximity, continue  recommend PT and mobility devices)  RED FLAGS: None   WEIGHT BEARING RESTRICTIONS: No   SUBJECTIVE:   SUBJECTIVE STATEMENT: She returns today after ~2 weeks, states ***    PAIN:  Are you having pain? Yes: NPRS scale:  *** 5/10 now at rest Pain location: Right hand digits 4 and 5 Pain description: Pins-and-needles sharp Aggravating factors: Pressing through palm Relieving factors: Rest  FALLS: Has patient fallen in last 6 months? Yes. Number of falls 1-considered mild risk of falls, does typically use a device and sees PT  LIVING ENVIRONMENT: Lives with: lives alone Lives in: House/apartment Has following equipment at home: [employer] - 2 wheeled  PLOF: Independent with household mobility with device and Independent with community mobility with device  PATIENT GOALS: To improve pain in the right hand  NEXT MD VISIT: As needed   OBJECTIVE: (All objective assessments below are from initial evaluation on: 08/16/24 unless otherwise specified.)   HAND DOMINANCE: Right   ADLs: Overall ADLs: States decreased ability to grab, hold household objects, pain and difficulty to open containers, perform FMS tasks (manipulate fasteners on clothing), etc   FUNCTIONAL OUTCOME MEASURES: 10/11/24: PSFS: ***   Eval: Patient Specific Functional Scale: 2 (lifting with Rt hand, open jars, using walker)  (Higher Score  =  Better Ability for the Selected Tasks)      UPPER EXTREMITY ROM     Shoulder to Wrist AROM Right eval Rt 09/13/24 Rt 09/24/24  Shoulder flexion     Shoulder abduction     Shoulder extension     Shoulder internal rotation     Shoulder external rotation     Elbow flexion     Elbow extension     Forearm supination 85    Forearm pronation  85    Wrist flexion 63 53 57 (55 in Lt)  Wrist extension 65 62 62  (67 in Lt)  Wrist ulnar deviation     Wrist radial deviation     Functional dart thrower's motion (F-DTM) in ulnar flexion     F-DTM in  radial extension      (Blank rows = not tested)   Hand AROM Right eval  Full Fist Ability (or Gap to Distal Palmar Crease) full  Thumb Opposition  (Kapandji Scale)  WFL  Thumb MCP (0-60)   Thumb IP (0-80)   Thumb Radial Abduction Span   Thumb Palmar Abduction Span   (Blank rows = not tested)   HAND FUNCTION: 10/11/24: Rt Grip: ***#    09/24/24: Grip Rt: 29#    09/13/24: Grip Rt: 27lbs   Eval: Observed weakness in affected Rt hand.  She also showed signs of intrinsic muscle weakness, ulnar nerve innervated. Grip strength Right: 26 lbs, Left: 30 lbs   SENSATION: 10/11/24:  S2PDT Rt Ulnar  ***mm gap, Median ***mm;   SWMFT Rt SF- ***  - diminished LT    09/24/24: S2PDT Rt Ulnar >70mm gap (off the chart),  Median 5mm;  Lt Ulnar 4mm, Median 4mm;  SWMFT Rt SF- 3.61 - diminished LT     Eval:  Light touch intact today,  though somewhat diminished near Guyon's canal and paresthesias reported dorsally and volarly in the last 2 fingers of the right hand  OBSERVATIONS:   Eval:  seems to have some tightness through Rt ECU and also irritable ulnar nerve at elbow and Guyon's canal with Tinel's Testing.  Tinel's test was negative for radial nerve.   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  She does have some apparent muscle wasting to the hyperthenar eminence.  Finger abduction and adduction were both somewhat weak in the right hand compared to the left   TODAY'S TREATMENT:  10/11/24: *** Follow-up in 3 to 4 weeks to check static 2-point discrimination, [place] Speed, HEP, give updated recommendations as appropriate.       09/24/24: Today we thoroughly review her home exercise program and the intent of it.  OT reviews safety and precautions with nerve impingements as well as bed strategies and also provides a cushion to elbow sleeve to help prevent pressure to the cubital tunnel.  OT does specific nerve testing which does confirm again ulnar nerve in the right hand is  greatly impacted.  Fortunately her movement and her grip strength are both improving somewhat which is a good sign, however nerve impingements can take quite a while for pain to resolve.  We discussed her self managing with home exercises and returning in 3 to 4 weeks to check if sensation is improving which it should be.  She states that she is comfortable with self-management during that time period, but she should return if anything is acutely bothering her.    PATIENT EDUCATION: Education details: See tx section above for details  Person educated: Patient Education method: Verbal Instruction, Teach back, Handouts  Education comprehension: States and demonstrates understanding, Additional Education required    HOME EXERCISE PROGRAM: Access Code: [facility] URL: [email or web address] Date: 08/16/2024 Prepared by: Melvenia Ada   GOALS: Goals reviewed with patient? Yes   SHORT TERM GOALS: (STG required if POC>30 days) Target Date: 08/16/24  Pt will demo/state understanding of initial HEP to improve pain levels and prerequisite motion. Goal status: MET   LONG TERM GOALS: Target Date: 10/22/24  Pt will improve functional ability by decreased impairment per PSFS assessment from 2 to 6 or better, for better quality of life. Goal status: INITIAL  2.  Pt will improve grip strength in Rt hand from 26lbs to at least 30lbs for functional use at home and in IADLs. Goal status: INITIAL  3.  Pt will improve A/ROM in Rt wrist flexion from 63 degrees to at least 66 degrees, to have functional motion for tasks like reach and grasp.  Goal status: INITIAL  4.  Pt will decrease pain in right hand at rest from 5-6/10 to 2/10 or better to have better sleep and occupational participation in daily roles. Goal status: INITIAL   ASSESSMENT:  CLINICAL IMPRESSION: 10/11/24: ***      PLAN:  OT FREQUENCY: Up to 1 time per week  OT DURATION: 3 additional weeks through 10/02/23 -  10/22/24 and up to 5 total visits as needed   PLANNED INTERVENTIONS: 97535 self care/ADL training, [id number] therapeutic exercise, 97530 therapeutic activity, 97112 neuromuscular re-education, 97140 manual therapy, 97035 ultrasound, 97032 electrical stimulation (manual), [id number] Orthotic Initial, [id number] Orthotic/Prosthetic subsequent, compression bandaging, Dry needling, energy conservation, coping strategies training, and patient/family education  RECOMMENDED OTHER SERVICES: none now, perhaps PT for continued gait/balance/foot drop issues  CONSULTED AND AGREED WITH PLAN OF CARE: Patient  PLAN FOR NEXT SESSION:   ***  Melvenia Ada, OTR/L, CHT  10/08/2024, 10:59 AM  "

## 2024-10-11 ENCOUNTER — Encounter: Admitting: Rehabilitative and Restorative Service Providers"

## 2024-10-12 ENCOUNTER — Ambulatory Visit: Admitting: Physical Therapy

## 2024-10-13 ENCOUNTER — Ambulatory Visit: Admitting: Pulmonary Disease

## 2024-10-18 ENCOUNTER — Ambulatory Visit: Admitting: Obstetrics and Gynecology

## 2024-10-19 ENCOUNTER — Ambulatory Visit: Admitting: Physical Therapy

## 2024-10-26 ENCOUNTER — Ambulatory Visit: Admitting: Physical Therapy

## 2024-10-28 ENCOUNTER — Ambulatory Visit: Admitting: Obstetrics and Gynecology

## 2024-11-02 ENCOUNTER — Ambulatory Visit: Admitting: Physical Therapy

## 2024-12-17 ENCOUNTER — Ambulatory Visit

## 2025-04-12 ENCOUNTER — Ambulatory Visit: Admitting: Neurology
